# Patient Record
Sex: Male | Born: 1965 | Race: White | Hispanic: No | Smoking: Current every day smoker
Health system: Southern US, Community
[De-identification: ages and names within clinical notes are randomized; demographics above are authoritative.]

## PROBLEM LIST (undated history)

## (undated) DIAGNOSIS — I499 Cardiac arrhythmia, unspecified: Secondary | ICD-10-CM

## (undated) DIAGNOSIS — E119 Type 2 diabetes mellitus without complications: Secondary | ICD-10-CM

## (undated) DIAGNOSIS — F4310 Post-traumatic stress disorder, unspecified: Secondary | ICD-10-CM

## (undated) DIAGNOSIS — I10 Essential (primary) hypertension: Secondary | ICD-10-CM

## (undated) DIAGNOSIS — K760 Fatty (change of) liver, not elsewhere classified: Secondary | ICD-10-CM

## (undated) DIAGNOSIS — J439 Emphysema, unspecified: Secondary | ICD-10-CM

## (undated) DIAGNOSIS — Z8489 Family history of other specified conditions: Secondary | ICD-10-CM

## (undated) DIAGNOSIS — F419 Anxiety disorder, unspecified: Secondary | ICD-10-CM

## (undated) DIAGNOSIS — J40 Bronchitis, not specified as acute or chronic: Secondary | ICD-10-CM

## (undated) DIAGNOSIS — K219 Gastro-esophageal reflux disease without esophagitis: Secondary | ICD-10-CM

## (undated) DIAGNOSIS — G4730 Sleep apnea, unspecified: Secondary | ICD-10-CM

## (undated) DIAGNOSIS — J45909 Unspecified asthma, uncomplicated: Secondary | ICD-10-CM

## (undated) DIAGNOSIS — E785 Hyperlipidemia, unspecified: Secondary | ICD-10-CM

## (undated) DIAGNOSIS — R519 Headache, unspecified: Secondary | ICD-10-CM

## (undated) DIAGNOSIS — K439 Ventral hernia without obstruction or gangrene: Secondary | ICD-10-CM

## (undated) DIAGNOSIS — R000 Tachycardia, unspecified: Secondary | ICD-10-CM

## (undated) DIAGNOSIS — R0600 Dyspnea, unspecified: Secondary | ICD-10-CM

## (undated) DIAGNOSIS — I2510 Atherosclerotic heart disease of native coronary artery without angina pectoris: Secondary | ICD-10-CM

## (undated) DIAGNOSIS — M1990 Unspecified osteoarthritis, unspecified site: Secondary | ICD-10-CM

## (undated) DIAGNOSIS — F32A Depression, unspecified: Secondary | ICD-10-CM

## (undated) DIAGNOSIS — C801 Malignant (primary) neoplasm, unspecified: Secondary | ICD-10-CM

## (undated) DIAGNOSIS — F329 Major depressive disorder, single episode, unspecified: Secondary | ICD-10-CM

## (undated) DIAGNOSIS — M6208 Separation of muscle (nontraumatic), other site: Secondary | ICD-10-CM

## (undated) DIAGNOSIS — C3490 Malignant neoplasm of unspecified part of unspecified bronchus or lung: Secondary | ICD-10-CM

## (undated) DIAGNOSIS — I219 Acute myocardial infarction, unspecified: Secondary | ICD-10-CM

## (undated) DIAGNOSIS — R51 Headache: Secondary | ICD-10-CM

## (undated) HISTORY — PX: MOUTH SURGERY: SHX715

## (undated) HISTORY — DX: Anxiety disorder, unspecified: F41.9

## (undated) HISTORY — DX: Hyperlipidemia, unspecified: E78.5

## (undated) HISTORY — DX: Headache: R51

## (undated) HISTORY — DX: Type 2 diabetes mellitus without complications: E11.9

## (undated) HISTORY — DX: Acute myocardial infarction, unspecified: I21.9

## (undated) HISTORY — DX: Major depressive disorder, single episode, unspecified: F32.9

## (undated) HISTORY — PX: SHOULDER SURGERY: SHX246

## (undated) HISTORY — PX: OTHER SURGICAL HISTORY: SHX169

## (undated) HISTORY — DX: Headache, unspecified: R51.9

## (undated) HISTORY — DX: Unspecified asthma, uncomplicated: J45.909

## (undated) HISTORY — DX: Separation of muscle (nontraumatic), other site: M62.08

## (undated) HISTORY — DX: Malignant neoplasm of unspecified part of unspecified bronchus or lung: C34.90

## (undated) HISTORY — PX: CLAVICLE SURGERY: SHX598

## (undated) HISTORY — DX: Post-traumatic stress disorder, unspecified: F43.10

## (undated) HISTORY — DX: Depression, unspecified: F32.A

## (undated) HISTORY — DX: Gastro-esophageal reflux disease without esophagitis: K21.9

---

## 2004-12-08 ENCOUNTER — Other Ambulatory Visit: Payer: Self-pay

## 2004-12-08 ENCOUNTER — Emergency Department: Payer: Self-pay | Admitting: Internal Medicine

## 2004-12-13 ENCOUNTER — Ambulatory Visit: Payer: Self-pay | Admitting: Internal Medicine

## 2007-03-27 ENCOUNTER — Inpatient Hospital Stay: Payer: Self-pay | Admitting: Internal Medicine

## 2007-03-27 ENCOUNTER — Other Ambulatory Visit: Payer: Self-pay

## 2008-04-24 DIAGNOSIS — I219 Acute myocardial infarction, unspecified: Secondary | ICD-10-CM

## 2008-04-24 HISTORY — DX: Acute myocardial infarction, unspecified: I21.9

## 2008-12-16 ENCOUNTER — Emergency Department: Payer: Self-pay | Admitting: Emergency Medicine

## 2008-12-17 ENCOUNTER — Ambulatory Visit: Payer: Self-pay | Admitting: Family Medicine

## 2009-01-02 ENCOUNTER — Inpatient Hospital Stay: Payer: Self-pay | Admitting: Psychiatry

## 2009-05-25 ENCOUNTER — Emergency Department: Payer: Self-pay | Admitting: Emergency Medicine

## 2009-08-22 ENCOUNTER — Emergency Department: Payer: Self-pay | Admitting: Emergency Medicine

## 2009-10-23 ENCOUNTER — Emergency Department: Payer: Self-pay | Admitting: Emergency Medicine

## 2009-11-24 ENCOUNTER — Ambulatory Visit: Payer: Self-pay | Admitting: Internal Medicine

## 2010-10-25 ENCOUNTER — Emergency Department: Payer: Self-pay | Admitting: Unknown Physician Specialty

## 2011-10-14 LAB — DRUG SCREEN, URINE
Amphetamines, Ur Screen: NEGATIVE (ref ?–1000)
Benzodiazepine, Ur Scrn: NEGATIVE (ref ?–200)
Cocaine Metabolite,Ur ~~LOC~~: NEGATIVE (ref ?–300)
MDMA (Ecstasy)Ur Screen: NEGATIVE (ref ?–500)
Opiate, Ur Screen: NEGATIVE (ref ?–300)
Phencyclidine (PCP) Ur S: NEGATIVE (ref ?–25)
Tricyclic, Ur Screen: POSITIVE (ref ?–1000)

## 2011-10-14 LAB — CBC
HCT: 42.5 % (ref 40.0–52.0)
HGB: 15 g/dL (ref 13.0–18.0)
MCH: 31.6 pg (ref 26.0–34.0)
MCV: 90 fL (ref 80–100)
Platelet: 206 10*3/uL (ref 150–440)
RBC: 4.74 10*6/uL (ref 4.40–5.90)
RDW: 13.3 % (ref 11.5–14.5)
WBC: 10.4 10*3/uL (ref 3.8–10.6)

## 2011-10-14 LAB — COMPREHENSIVE METABOLIC PANEL
Albumin: 3.9 g/dL (ref 3.4–5.0)
Anion Gap: 9 (ref 7–16)
BUN: 12 mg/dL (ref 7–18)
Bilirubin,Total: 0.3 mg/dL (ref 0.2–1.0)
EGFR (Non-African Amer.): >60
Glucose: 102 mg/dL — ABNORMAL HIGH (ref 65–99)
SGPT (ALT): 34 U/L
Sodium: 140 mmol/L (ref 136–145)
Total Protein: 8 g/dL (ref 6.4–8.2)

## 2011-10-14 LAB — ETHANOL
Ethanol %: <0.003 % (ref 0.000–0.080)
Ethanol: <3 mg/dL

## 2011-10-14 LAB — SALICYLATE LEVEL: Salicylates, Serum: 2.5 mg/dL

## 2011-10-16 ENCOUNTER — Inpatient Hospital Stay: Payer: Self-pay | Admitting: Psychiatry

## 2011-11-18 ENCOUNTER — Emergency Department: Payer: Self-pay | Admitting: *Deleted

## 2011-11-18 LAB — TROPONIN I: Troponin-I: <0.02 ng/mL

## 2011-11-18 LAB — COMPREHENSIVE METABOLIC PANEL
BUN: 9 mg/dL (ref 7–18)
Bilirubin,Total: 0.3 mg/dL (ref 0.2–1.0)
Chloride: 96 mmol/L — ABNORMAL LOW (ref 98–107)
Co2: 27 mmol/L (ref 21–32)
Creatinine: 0.83 mg/dL (ref 0.60–1.30)
EGFR (African American): >60
EGFR (Non-African Amer.): >60
Osmolality: 261 (ref 275–301)
SGPT (ALT): 22 U/L
Sodium: 131 mmol/L — ABNORMAL LOW (ref 136–145)

## 2011-11-18 LAB — CBC
HCT: 43.1 % (ref 40.0–52.0)
MCH: 32.3 pg (ref 26.0–34.0)
MCHC: 35.6 g/dL (ref 32.0–36.0)
MCV: 91 fL (ref 80–100)
RDW: 13.8 % (ref 11.5–14.5)

## 2011-11-18 LAB — CK TOTAL AND CKMB (NOT AT ARMC): CK, Total: 116 U/L (ref 35–232)

## 2012-01-02 ENCOUNTER — Inpatient Hospital Stay: Payer: Self-pay | Admitting: Psychiatry

## 2012-01-02 LAB — DRUG SCREEN, URINE
Amphetamines, Ur Screen: NEGATIVE (ref ?–1000)
Barbiturates, Ur Screen: NEGATIVE (ref ?–200)
Benzodiazepine, Ur Scrn: NEGATIVE (ref ?–200)
Cannabinoid 50 Ng, Ur ~~LOC~~: NEGATIVE (ref ?–50)
Cocaine Metabolite,Ur ~~LOC~~: NEGATIVE (ref ?–300)
Methadone, Ur Screen: NEGATIVE (ref ?–300)
Opiate, Ur Screen: NEGATIVE (ref ?–300)
Tricyclic, Ur Screen: NEGATIVE (ref ?–1000)

## 2012-01-02 LAB — URINALYSIS, COMPLETE
Blood: NEGATIVE
Glucose,UR: NEGATIVE mg/dL (ref 0–75)
Leukocyte Esterase: NEGATIVE
Nitrite: NEGATIVE
Protein: NEGATIVE
RBC,UR: 1 /HPF (ref 0–5)
Specific Gravity: 1.015 (ref 1.003–1.030)
WBC UR: 2 /HPF (ref 0–5)

## 2012-01-02 LAB — COMPREHENSIVE METABOLIC PANEL
Albumin: 3.8 g/dL (ref 3.4–5.0)
Anion Gap: 11 (ref 7–16)
BUN: 14 mg/dL (ref 7–18)
Bilirubin,Total: 0.2 mg/dL (ref 0.2–1.0)
Chloride: 106 mmol/L (ref 98–107)
Co2: 24 mmol/L (ref 21–32)
Creatinine: 0.98 mg/dL (ref 0.60–1.30)
EGFR (African American): >60
EGFR (Non-African Amer.): >60
Potassium: 3.9 mmol/L (ref 3.5–5.1)
SGOT(AST): 15 U/L (ref 15–37)
SGPT (ALT): 22 U/L (ref 12–78)
Total Protein: 7.9 g/dL (ref 6.4–8.2)

## 2012-01-02 LAB — CBC
HCT: 42.6 % (ref 40.0–52.0)
MCV: 93 fL (ref 80–100)
Platelet: 238 10*3/uL (ref 150–440)
RBC: 4.57 10*6/uL (ref 4.40–5.90)
RDW: 14.5 % (ref 11.5–14.5)
WBC: 9 10*3/uL (ref 3.8–10.6)

## 2012-01-02 LAB — ETHANOL: Ethanol %: <0.003 % (ref 0.000–0.080)

## 2012-01-02 LAB — TSH: Thyroid Stimulating Horm: 1.04 u[IU]/mL

## 2012-01-03 LAB — CARBAMAZEPINE LEVEL, TOTAL: Carbamazepine: 6.4 ug/mL (ref 4.0–12.0)

## 2012-01-04 LAB — FOLATE: Folic Acid: 7.9 ng/mL (ref 3.1–100.0)

## 2012-01-29 ENCOUNTER — Emergency Department: Payer: Self-pay | Admitting: Emergency Medicine

## 2012-01-29 LAB — BASIC METABOLIC PANEL
Anion Gap: 9 (ref 7–16)
BUN: 8 mg/dL (ref 7–18)
Chloride: 107 mmol/L (ref 98–107)
Creatinine: 1.07 mg/dL (ref 0.60–1.30)
EGFR (African American): >60
EGFR (Non-African Amer.): >60
Glucose: 91 mg/dL (ref 65–99)
Osmolality: 281 (ref 275–301)
Potassium: 3.7 mmol/L (ref 3.5–5.1)
Sodium: 142 mmol/L (ref 136–145)

## 2012-01-29 LAB — CBC
MCHC: 35.2 g/dL (ref 32.0–36.0)
Platelet: 208 10*3/uL (ref 150–440)
RBC: 4.74 10*6/uL (ref 4.40–5.90)
WBC: 10.9 10*3/uL — ABNORMAL HIGH (ref 3.8–10.6)

## 2012-01-29 LAB — CK TOTAL AND CKMB (NOT AT ARMC)
CK, Total: 84 U/L (ref 35–232)
CK-MB: <0.5 ng/mL — ABNORMAL LOW (ref 0.5–3.6)

## 2012-01-29 LAB — TROPONIN I: Troponin-I: <0.02 ng/mL

## 2012-01-30 LAB — DRUG SCREEN, URINE
Amphetamines, Ur Screen: NEGATIVE (ref ?–1000)
Barbiturates, Ur Screen: NEGATIVE (ref ?–200)
Benzodiazepine, Ur Scrn: NEGATIVE (ref ?–200)
Methadone, Ur Screen: NEGATIVE (ref ?–300)
Opiate, Ur Screen: NEGATIVE (ref ?–300)
Tricyclic, Ur Screen: NEGATIVE (ref ?–1000)

## 2012-02-06 ENCOUNTER — Emergency Department: Payer: Self-pay | Admitting: Emergency Medicine

## 2012-02-06 LAB — TROPONIN I: Troponin-I: <0.02 ng/mL

## 2012-02-06 LAB — CK TOTAL AND CKMB (NOT AT ARMC): CK-MB: <0.5 ng/mL — ABNORMAL LOW (ref 0.5–3.6)

## 2012-02-06 LAB — COMPREHENSIVE METABOLIC PANEL
Albumin: 4.2 g/dL (ref 3.4–5.0)
Alkaline Phosphatase: 142 U/L — ABNORMAL HIGH (ref 50–136)
Anion Gap: 9 (ref 7–16)
BUN: 8 mg/dL (ref 7–18)
Calcium, Total: 9.6 mg/dL (ref 8.5–10.1)
Creatinine: 0.84 mg/dL (ref 0.60–1.30)
EGFR (Non-African Amer.): >60
Glucose: 93 mg/dL (ref 65–99)
Osmolality: 276 (ref 275–301)
SGOT(AST): 13 U/L — ABNORMAL LOW (ref 15–37)
SGPT (ALT): 27 U/L (ref 12–78)
Sodium: 139 mmol/L (ref 136–145)

## 2012-02-06 LAB — CBC
HCT: 47.1 % (ref 40.0–52.0)
HGB: 16.4 g/dL (ref 13.0–18.0)
MCHC: 34.9 g/dL (ref 32.0–36.0)
RBC: 5.01 10*6/uL (ref 4.40–5.90)
WBC: 9.1 10*3/uL (ref 3.8–10.6)

## 2012-02-06 LAB — DRUG SCREEN, URINE
Amphetamines, Ur Screen: NEGATIVE (ref ?–1000)
Cocaine Metabolite,Ur ~~LOC~~: NEGATIVE (ref ?–300)
MDMA (Ecstasy)Ur Screen: NEGATIVE (ref ?–500)
Methadone, Ur Screen: NEGATIVE (ref ?–300)
Opiate, Ur Screen: NEGATIVE (ref ?–300)
Phencyclidine (PCP) Ur S: NEGATIVE (ref ?–25)

## 2012-12-08 ENCOUNTER — Observation Stay: Payer: Self-pay | Admitting: Internal Medicine

## 2012-12-08 LAB — CBC
HCT: 42 % (ref 40.0–52.0)
MCHC: 35 g/dL (ref 32.0–36.0)
Platelet: 207 10*3/uL (ref 150–440)
RBC: 4.87 10*6/uL (ref 4.40–5.90)

## 2012-12-08 LAB — HEPATIC FUNCTION PANEL A (ARMC)
Albumin: 4.1 g/dL (ref 3.4–5.0)
Alkaline Phosphatase: 118 U/L (ref 50–136)
Bilirubin, Direct: <0.1 mg/dL (ref 0.00–0.20)
Bilirubin,Total: 0.3 mg/dL (ref 0.2–1.0)
SGOT(AST): 28 U/L (ref 15–37)
SGPT (ALT): 42 U/L (ref 12–78)
Total Protein: 8 g/dL (ref 6.4–8.2)

## 2012-12-08 LAB — BASIC METABOLIC PANEL
Anion Gap: 6 — ABNORMAL LOW (ref 7–16)
BUN: 17 mg/dL (ref 7–18)
Creatinine: 1.25 mg/dL (ref 0.60–1.30)
EGFR (African American): >60
Osmolality: 278 (ref 275–301)

## 2012-12-08 LAB — CK TOTAL AND CKMB (NOT AT ARMC)
CK, Total: 72 U/L (ref 35–232)
CK, Total: 74 U/L (ref 35–232)
CK-MB: <0.5 ng/mL — ABNORMAL LOW (ref 0.5–3.6)
CK-MB: <0.5 ng/mL — ABNORMAL LOW (ref 0.5–3.6)

## 2012-12-08 LAB — LIPASE, BLOOD: Lipase: 98 U/L (ref 73–393)

## 2012-12-08 LAB — TROPONIN I: Troponin-I: <0.02 ng/mL

## 2012-12-09 DIAGNOSIS — R079 Chest pain, unspecified: Secondary | ICD-10-CM

## 2012-12-09 LAB — CK TOTAL AND CKMB (NOT AT ARMC): CK-MB: <0.5 ng/mL — ABNORMAL LOW (ref 0.5–3.6)

## 2012-12-09 LAB — LIPID PANEL
Cholesterol: 166 mg/dL (ref 0–200)
Ldl Cholesterol, Calc: 96 mg/dL (ref 0–100)
Triglycerides: 130 mg/dL (ref 0–200)
VLDL Cholesterol, Calc: 26 mg/dL (ref 5–40)

## 2012-12-09 LAB — TROPONIN I: Troponin-I: <0.02 ng/mL

## 2012-12-09 LAB — TSH: Thyroid Stimulating Horm: 0.941 u[IU]/mL

## 2012-12-09 LAB — HEMOGLOBIN A1C: Hemoglobin A1C: 6.2 % (ref 4.2–6.3)

## 2013-07-31 ENCOUNTER — Emergency Department: Payer: Self-pay | Admitting: Emergency Medicine

## 2013-07-31 LAB — URINALYSIS, COMPLETE
BLOOD: NEGATIVE
Bacteria: NONE SEEN
Bilirubin,UR: NEGATIVE
GLUCOSE, UR: NEGATIVE mg/dL (ref 0–75)
Ketone: NEGATIVE
Leukocyte Esterase: NEGATIVE
Nitrite: NEGATIVE
PROTEIN: NEGATIVE
Ph: 7 (ref 4.5–8.0)
RBC,UR: NONE SEEN /HPF (ref 0–5)
Specific Gravity: 1.003 (ref 1.003–1.030)
Squamous Epithelial: NONE SEEN
WBC UR: <1 /HPF (ref 0–5)

## 2013-07-31 LAB — BASIC METABOLIC PANEL
Anion Gap: 4 — ABNORMAL LOW (ref 7–16)
BUN: 9 mg/dL (ref 7–18)
CHLORIDE: 109 mmol/L — AB (ref 98–107)
CO2: 29 mmol/L (ref 21–32)
Calcium, Total: 9.1 mg/dL (ref 8.5–10.1)
Creatinine: 1.05 mg/dL (ref 0.60–1.30)
EGFR (African American): >60
Glucose: 136 mg/dL — ABNORMAL HIGH (ref 65–99)
OSMOLALITY: 284 (ref 275–301)
Potassium: 4 mmol/L (ref 3.5–5.1)
Sodium: 142 mmol/L (ref 136–145)

## 2013-07-31 LAB — CBC
HCT: 42.5 % (ref 40.0–52.0)
HGB: 14.4 g/dL (ref 13.0–18.0)
MCH: 31.1 pg (ref 26.0–34.0)
MCHC: 33.8 g/dL (ref 32.0–36.0)
MCV: 92 fL (ref 80–100)
PLATELETS: 231 10*3/uL (ref 150–440)
RBC: 4.63 10*6/uL (ref 4.40–5.90)
RDW: 14.2 % (ref 11.5–14.5)
WBC: 8.1 10*3/uL (ref 3.8–10.6)

## 2013-07-31 LAB — CK TOTAL AND CKMB (NOT AT ARMC)
CK, TOTAL: 144 U/L
CK-MB: 0.6 ng/mL (ref 0.5–3.6)

## 2013-07-31 LAB — DRUG SCREEN, URINE

## 2013-07-31 LAB — PRO B NATRIURETIC PEPTIDE: B-Type Natriuretic Peptide: 97 pg/mL (ref 0–125)

## 2013-07-31 LAB — TROPONIN I: Troponin-I: <0.02 ng/mL

## 2013-10-13 ENCOUNTER — Emergency Department: Payer: Self-pay | Admitting: Emergency Medicine

## 2013-11-28 ENCOUNTER — Emergency Department: Payer: Self-pay | Admitting: Emergency Medicine

## 2013-11-28 LAB — URINALYSIS, COMPLETE
Bacteria: NONE SEEN
Bilirubin,UR: NEGATIVE
Blood: NEGATIVE
Glucose,UR: 50 mg/dL (ref 0–75)
KETONE: NEGATIVE
LEUKOCYTE ESTERASE: NEGATIVE
Nitrite: NEGATIVE
PH: 6 (ref 4.5–8.0)
Protein: NEGATIVE
RBC,UR: NONE SEEN /HPF (ref 0–5)
SPECIFIC GRAVITY: 1.017 (ref 1.003–1.030)
Squamous Epithelial: NONE SEEN
WBC UR: NONE SEEN /HPF (ref 0–5)

## 2013-11-28 LAB — CBC
HCT: 42.7 % (ref 40.0–52.0)
HGB: 14.8 g/dL (ref 13.0–18.0)
MCH: 31.3 pg (ref 26.0–34.0)
MCHC: 34.6 g/dL (ref 32.0–36.0)
MCV: 91 fL (ref 80–100)
Platelet: 209 10*3/uL (ref 150–440)
RBC: 4.72 10*6/uL (ref 4.40–5.90)
RDW: 13.6 % (ref 11.5–14.5)
WBC: 10.3 10*3/uL (ref 3.8–10.6)

## 2013-11-28 LAB — TROPONIN I
Troponin-I: <0.02 ng/mL
Troponin-I: <0.02 ng/mL

## 2013-11-28 LAB — BASIC METABOLIC PANEL
ANION GAP: 7 (ref 7–16)
BUN: 12 mg/dL (ref 7–18)
CO2: 26 mmol/L (ref 21–32)
Calcium, Total: 8.5 mg/dL (ref 8.5–10.1)
Chloride: 105 mmol/L (ref 98–107)
Creatinine: 1.25 mg/dL (ref 0.60–1.30)
EGFR (African American): >60
Glucose: 163 mg/dL — ABNORMAL HIGH (ref 65–99)
OSMOLALITY: 279 (ref 275–301)
Potassium: 3.7 mmol/L (ref 3.5–5.1)
SODIUM: 138 mmol/L (ref 136–145)

## 2014-03-03 DIAGNOSIS — S42001K Fracture of unspecified part of right clavicle, subsequent encounter for fracture with nonunion: Secondary | ICD-10-CM | POA: Insufficient documentation
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
## 2014-08-14 NOTE — Discharge Summary (Signed)
PATIENT NAME:  Raymond, [name] MR#:  [id number] DATE OF BIRTH:  Jan 10, 1966  ADMITTING PHYSICIAN: Dr. Dustin Flock.   DISCHARGING PHYSICIAN: Dr. Gladstone Lighter.  DISCHARGE DIAGNOSES: 1.  Chest pain, likely gastritis.  2.  Hypertension.  3.  Hyperlipidemia.  4.  History of supraventricular tachycardia.  5.  Posttraumatic stress disorder.  6.  Borderline personality disorder.  7.  Bipolar with anxiety and depression.   DISCHARGE HOME MEDICATIONS:  1.  Metoprolol 100 mg p.o. daily.  2.  Paroxetine 40 mg p.o. at bedtime.  3.  Sublingual nitroglycerin 0.4 mg every five minutes p.r.n. for chest pain.  4.  Pravastatin 40 mg p.o. daily.  5.  Aspirin 81 mg p.o. daily.  6.  Omeprazole 20 mg p.o. daily.   DISCHARGE DIET: Low-sodium diet.   DISCHARGE ACTIVITY: As tolerated.   FOLLOWUP INSTRUCTIONS: PCP follow-up in 2 to 3 weeks.   LABS AND IMAGING STUDIES PRIOR TO DISCHARGE: LDL cholesterol 96, HDL 44, total cholesterol 166, triglycerides 130. HbA1c 6.2. TSH 0.941. WBC 11.5, hemoglobin 14.7, hematocrit 42.0, platelet count 207.   Sodium 136, potassium 4.4, chloride 103, bicarbonate 27, BUN 17, creatinine 1.25, glucose 177,  calcium 9.3, troponin less than 0.02.   ALT 42, AST 28, alk phos 118, total bili 0.3, lipase of 98.   Chest x-ray revealing anterior portion of the first rib is more prominent on the right side than the left side bilaterally which is stable and comparable to previous studies done.   Myocardial scan showing no significant ischemia. No wall-motion abnormalities. Estimated EF is 59%. Normal global LV function. Overall a low-risk scan.   BRIEF HOSPITAL COURSE: [name] is a 49 year old male with history of PTSD, borderline personality disorder, bipolar with depression and anxiety, history of hypertension, and coronary artery disease, who was brought in from prison secondary to a complaint of chest pain.   1.  Chest pain sounds more like gastritis kind of pain, as  it was more epigastric region radiating up in the midsternal. The pain has improved significantly since the hospital admission, but because there was a question of coronary artery disease in the past according to the patient, he had a Myoview done which was completely normal, so he was discharged home. He was placed on omeprazole for his possible gastritis.  2.  Hypertension. The patient on metoprolol.  3.  Hyperlipidemia. The patient is on pravastatin.   The patient was brought with police officer and was discharged back to prison. His course has been otherwise uneventful in the hospital.   DISCHARGE CONDITION: Stable.   DISCHARGE DISPOSITION: Being discharged back to prison.   Time spent on discharge is 45 minutes.    ____________________________ Gladstone Lighter, MD rk:np D: 12/11/2012 15:17:55 ET T: 12/11/2012 20:49:40 ET JOB#: [id number]  cc: Gladstone Lighter, MD, <Dictator> Gladstone Lighter MD ELECTRONICALLY SIGNED 12/16/2012 22:16

## 2014-08-14 NOTE — H&P (Signed)
PATIENT NAME:  Raymond [name], Raymond [name] MR#:  [id number] DATE OF BIRTH:  December 11, 1965  DATE OF ADMISSION:  12/08/2012  PRIMARY CARE PROVIDER: None.   ED REFERRING DOCTOR:  Dr. Hinda Kehr.  CHIEF COMPLAINT: Substernal chest pain, as well as epigastric pain.   HISTORY OF PRESENT ILLNESS: The patient is a 49 year old Caucasian male who is currently in prison, has a history of hypertension according to him. He had a heart attack about  5 to 6 years ago, who was told that he needed a cardiac cath but he never got the cardiac catheterization, who states that around noontime today he started having sharp, substernal pain, and also became diaphoretic and had some nausea. The patient reports that he does have a history of GERD, and does not have a similar type of pain. He does have some acidic taste in mouth when his GERD bothers him. He did not have any shortness of breath. He did not have any radiation of the pain. The patient reports that prior to this episode last time he had any type of chest discomfort was about 5 to 6 years ago. He otherwise denies any orthopnea. No dyspnea on exertion. Denies any fevers, chills. No cough. No emesis or diarrhea.   PAST MEDICAL HISTORY: Significant for:  1.  Hypertension.  2.  History of coronary artery, disease as per his report.  3.  PTSD.  4.  Anxiety.  5.  Borderline personality disorder.  6.  Bipolar disorder.  7.  History of supraventricular tachycardia.  8.  Depression.  9.  History of right clavicle fracture. Did not have it repaired.   No surgeries.   ALLERGIES: To HYDROCODONE and ONIONS.   CURRENT MEDICATIONS: Metoprolol tartrate 200 q. daily, paroxetine 40 q. daily, pravastatin 40 at bedtime.   SOCIAL HISTORY: Smokes 1-1/2 pack for 30 years. No smoking for the past one year since in jail. No alcohol use currently. He used to drink heavily. No drug use.   FAMILY HISTORY: Father with coronary artery disease. Not sure at what age.   REVIEW OF SYSTEMS:   CONSTITUTIONAL: Denies any fevers, fatigue. No weakness. No pain. No weight loss. No weight gain. He is having pain in his chest.  EYES: No blurred or double vision. No pain. No redness. No inflammation. No glaucoma. No cataracts.  ENT: No tinnitus. No ear pain. No hearing loss. No allergies, seasonal or year-round allergies. No epistaxis. No difficulty swallowing.  RESPIRATORY: Denies any cough, wheezing, hemoptysis. No dyspnea. No asthma.  CARDIOVASCULAR: Complains of chest pain. No orthopnea. No edema. No arrhythmia. No palpitations. No syncope.  GASTROINTESTINAL: No nausea, vomiting, diarrhea. No abdominal pain. No hematemesis. No melena. No ulcer. Has chronic GERD.  GENITOURINARY: Denies any dysuria, hematuria, renal calculus or frequency.  ENDOCRINE: Denies any polyuria, nocturia or thyroid problems.  HEMATOLOGIC/LYMPHATIC: Denies any major bruisability or bleeding.  SKIN: No acne. No rash. No changes in mole, hair or skin.  MUSCULOSKELETAL: Denies any pain in the neck, back or shoulder.  NEUROLOGIC: No numbness. No CVA. No transient ischemic attack. No seizures.  PSYCHIATRIC: Has history of anxiety. He has bipolar disorder, depression.   PHYSICAL EXAMINATION: VITAL SIGNS: Temperature 97.7, pulse 62, respirations 18, blood pressure 151/81, O2 97%.  GENERAL: The patient is a mildly obese male, in no acute distress.  HEENT: Head atraumatic, normocephalic. Pupils equally round, reactive to light and accommodation. There is no conjunctival pallor. No scleral icterus. Nasal exam shows no dryness  or ulceration.  OROPHARYNX: Clear,  without any exudates.  NECK: Supple. No JVD. No carotid bruits.  CARDIOVASCULAR: Regular rate and rhythm. No murmurs, rubs, clicks. PMI is not displaced.  LUNGS: Clear to auscultation bilaterally, without any rales, rhonchi, wheezing.   ABDOMEN: Soft, nontender, nondistended. Positive bowel sounds x 4.  EXTREMITIES: No clubbing, cyanosis or edema.  SKIN: No  rash.  LYMPHATICS: No lymph nodes palpable.  VASCULAR: Good DP, PT pulses.  PSYCHIATRIC: Not anxious or depressed.  NEUROLOGIC: Awake, alert, oriented x 3. No focal deficits.   EVALUATIONS: EKG shows normal sinus rhythm with Q waves in leads V1 and V2. No ST-T wave changes.   Glucose 177, BUN 17, creatinine 1.25, sodium 136, potassium 4.4, chloride 103, CO2 22, calcium 9.3. Troponin less than 0.02 x 2.   WBC 11.5, hemoglobin 14.7, platelet count 207.   Chest x-ray showed probable prominent anterior first rib.   ASSESSMENT AND PLAN: The patient is 49 year old with a history of hypertension, hyperlipidemia, with his blood sugar being elevated, possible diabetes, who is having sharp chest pain as well as some epigastric pain.   1.  Chest pain/upper epigastric pain, atypical in nature: At this time will place the patient under observation, check serial enzymes, perform a stress sestamibi in the morning. I will try some Gas-Ex and proton pump inhibitors b.i.d. In case this is GI-related I will also check a lipase and LFTs.  2.  Hypertension: Continue metoprolol, as taking at home.  3.  Depression, anxiety: Continue paroxetine as taking at home. 4.  Hyperglycemia: Noted on BMP: We will check a hemoglobin Ac1.  5.  Hyperlipidemia. Continue pravastatin as taking at home. Check a fasting lipid panel in the a.m.   TIME SPENT: Note 45 minutes spent on this patient. Thank you    ____________________________ Lafonda Mosses. Posey Pronto, MD shp:dm D: 12/08/2012 14:26:01 ET T: 12/08/2012 15:38:11 ET JOB#: [id number]  cc: Rasaan Brotherton H. Posey Pronto, MD, <Dictator> Alric Seton MD ELECTRONICALLY SIGNED 12/09/2012 16:30

## 2014-08-16 NOTE — Consult Note (Signed)
Psychological Assessment  Gearld Creamer47of Evaluation: 6-26-13Administered: [place] (MMPI-2) for Referral: Mr. Luce was referred for a psychological assessment by his physician, Orson Slick, MD.  He was admitted to [facility] for the treatment of mood disorder. Please see the history and physical and psychosocial history for further background information. An assessment for diagnostic clarification was requested. Mr. Kelter MMPI-2 protocol is compared to that of other adult males he obtained the following profile: [id number]/:# The MMPI-2 validity scales indicate that the clinical profile is not valid. It appears to be extremely exaggerated possibly in an attempt to call attention to the extreme distress he experiences. Impression:Profile   Electronic Signatures: Garald Braver (PsyD, HSP-P)  (Signed on 26-Jun-13 15:36)  Authored  Last Updated: 26-Jun-13 15:36 by Garald Braver (PsyD, HSP-P)
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
## 2014-11-02 ENCOUNTER — Emergency Department
Admission: EM | Admit: 2014-11-02 | Discharge: 2014-11-02 | Disposition: A | Discharge status: Home / Self Care | Payer: Self-pay | Attending: Student | Admitting: Student

## 2014-11-02 ENCOUNTER — Encounter: Payer: Self-pay | Admitting: Emergency Medicine

## 2014-11-02 ENCOUNTER — Other Ambulatory Visit: Payer: Self-pay

## 2014-11-02 ENCOUNTER — Emergency Department: Payer: Self-pay

## 2014-11-02 DIAGNOSIS — M25511 Pain in right shoulder: Secondary | ICD-10-CM | POA: Insufficient documentation

## 2014-11-02 DIAGNOSIS — Z9889 Other specified postprocedural states: Secondary | ICD-10-CM | POA: Insufficient documentation

## 2014-11-02 DIAGNOSIS — Z720 Tobacco use: Secondary | ICD-10-CM | POA: Insufficient documentation

## 2014-11-02 DIAGNOSIS — I10 Essential (primary) hypertension: Secondary | ICD-10-CM | POA: Insufficient documentation

## 2014-11-02 DIAGNOSIS — R000 Tachycardia, unspecified: Secondary | ICD-10-CM | POA: Insufficient documentation

## 2014-11-02 HISTORY — DX: Cardiac arrhythmia, unspecified: I49.9

## 2014-11-02 HISTORY — DX: Essential (primary) hypertension: I10

## 2014-11-02 LAB — CBC
HEMATOCRIT: 42.4 % (ref 40.0–52.0)
Hemoglobin: 14.7 g/dL (ref 13.0–18.0)
MCH: 32.4 pg (ref 26.0–34.0)
MCHC: 34.6 g/dL (ref 32.0–36.0)
MCV: 93.7 fL (ref 80.0–100.0)
Platelets: 199 10*3/uL (ref 150–440)
RBC: 4.53 MIL/uL (ref 4.40–5.90)
RDW: 14.3 % (ref 11.5–14.5)
WBC: 9.8 10*3/uL (ref 3.8–10.6)

## 2014-11-02 LAB — COMPREHENSIVE METABOLIC PANEL
ALBUMIN: 4 g/dL (ref 3.5–5.0)
ALT: 35 U/L (ref 17–63)
AST: 33 U/L (ref 15–41)
Alkaline Phosphatase: 93 U/L (ref 38–126)
Anion gap: 9 (ref 5–15)
BILIRUBIN TOTAL: 0.3 mg/dL (ref 0.3–1.2)
BUN: 14 mg/dL (ref 6–20)
CALCIUM: 8.9 mg/dL (ref 8.9–10.3)
CO2: 22 mmol/L (ref 22–32)
CREATININE: 1.2 mg/dL (ref 0.61–1.24)
Chloride: 105 mmol/L (ref 101–111)
GFR calc non Af Amer: >60 mL/min (ref >60)
GLUCOSE: 239 mg/dL — AB (ref 65–99)
Potassium: 4 mmol/L (ref 3.5–5.1)
Sodium: 136 mmol/L (ref 135–145)
TOTAL PROTEIN: 7.5 g/dL (ref 6.5–8.1)

## 2014-11-02 LAB — TROPONIN I: Troponin I: <0.03 ng/mL (ref <0.031)

## 2014-11-02 MED ORDER — DEXAMETHASONE SODIUM PHOSPHATE 10 MG/ML IJ SOLN
10.0000 mg | Freq: Once | INTRAMUSCULAR | Status: AC
  Administered 2014-11-02: 10 mg via INTRAVENOUS
  Filled 2014-11-02: qty 1

## 2014-11-02 MED ORDER — HYDROMORPHONE HCL 1 MG/ML IJ SOLN
1.0000 mg | Freq: Once | INTRAMUSCULAR | Status: AC
  Administered 2014-11-02: 1 mg via INTRAVENOUS
  Filled 2014-11-02: qty 1

## 2014-11-02 MED ORDER — SODIUM CHLORIDE 0.9 % IV BOLUS (SEPSIS)
500.0000 mL | Freq: Once | INTRAVENOUS | Status: AC
  Administered 2014-11-02: 500 mL via INTRAVENOUS

## 2014-11-02 MED ORDER — OXYCODONE HCL 5 MG PO TABS
5.0000 mg | ORAL_TABLET | Freq: Four times a day (QID) | ORAL | Status: DC | PRN
Start: 2014-11-02 — End: 2017-05-30

## 2014-11-02 NOTE — ED Provider Notes (Signed)
[facility] Emergency Department Provider Note     Time seen: ----------------------------------------- 2:44 PM on 11/02/2014 -----------------------------------------    I have reviewed the triage vital signs and the nursing notes.   HISTORY  Chief Complaint Shoulder Pain    HPI Raymond [name] is a 48 y.o. male who presents ER for severe right shoulder pain and some numbness. Patient states the numbness has worsened and the pain radiates from his right shoulder down his right arm. Patient states he had shoulder surgery at [facility] in December and has not had any pain medicine since January. Patient states working a lot physical man aggravated his shoulder. Denies any fevers chills chest pain shortness breath, nausea vomiting or diarrhea. Pain is currently severe and worse in the right shoulder with range of motion.   Past Medical History  Diagnosis Date  . Hypertension   . Irregular heart beat unk    There are no active problems to display for this patient.   Past Surgical History  Procedure Laterality Date  . Shoulder surgery Right     Allergies Norco  Social History History  Substance Use Topics  . Smoking status: Current Every Day Smoker  . Smokeless tobacco: Not on file  . Alcohol Use: No    Review of Systems Constitutional: Negative for fever. Eyes: Negative for visual changes. ENT: Negative for sore throat. Cardiovascular: Negative for chest pain. Respiratory: Negative for shortness of breath. Gastrointestinal: Negative for abdominal pain, vomiting and diarrhea. Genitourinary: Negative for dysuria. Musculoskeletal: Positive for severe right shoulder pain Skin: Negative for rash. Neurological: Negative for headaches, positive for right shoulder numbness  10-point ROS otherwise negative.  ____________________________________________   PHYSICAL EXAM:  VITAL SIGNS: ED Triage Vitals  Enc Vitals Group     BP 11/02/14 1431  165/92 mmHg     Pulse Rate 11/02/14 1431 144     Resp 11/02/14 1431 20     Temp 11/02/14 1431 98.2 F (36.8 C)     Temp Source 11/02/14 1431 Oral     SpO2 11/02/14 1431 96 %     Weight 11/02/14 1431 230 lb (104.327 kg)     Height 11/02/14 1431 5\' 9"  (1.753 m)     Head Cir --      Peak Flow --      Pain Score 11/02/14 1432 10     Pain Loc --      Pain Edu? --      Excl. in [place]? --     Constitutional: Alert and oriented. Mild distress Eyes: Conjunctivae are normal. PERRL. Normal extraocular movements. ENT   Head: Normocephalic and atraumatic.   Nose: No congestion/rhinnorhea.   Mouth/Throat: Mucous membranes are moist.   Neck: No stridor. Hematological/Lymphatic/Immunilogical: No cervical lymphadenopathy. Cardiovascular: Rapid rate, regular rhythm. Normal and symmetric distal pulses are present in all extremities. No murmurs, rubs, or gallops. Respiratory: Normal respiratory effort without tachypnea nor retractions. Breath sounds are clear and equal bilaterally. No wheezes/rales/rhonchi. Gastrointestinal: Soft and nontender. No distention. No abdominal bruits. There is no CVA tenderness. Musculoskeletal: Significant tenderness of the right shoulder. Severe pain range of motion the right shoulder. There is focal paresthesia around the right shoulder anteriorly. Neurologic:  Normal speech and language. No gross focal neurologic deficits are appreciated. Speech is normal. No gait instability. Skin:  Skin is warm, dry and intact. No rash noted. Psychiatric: Mood and affect are normal. Speech and behavior are normal. Patient exhibits appropriate insight and judgment. ____________________________________________  EKG: Interpreted  by me. Sinus tachycardia with rate 139 bpm, normal axis, normal intervals. No evidence of hypertrophy or acute infarction.  ____________________________________________  ED COURSE:  Pertinent labs & imaging results that were available during my  care of the patient were reviewed by me and considered in my medical decision making (see chart for details). Check cardiac labs, give IV pain medicine and Decadron. ____________________________________________    LABS (pertinent positives/negatives)  Labs Reviewed - No data to display  RADIOLOGY Chest x-ray will be performed  ____________________________________________  FINAL ASSESSMENT AND PLAN  Severe right shoulder pain, tachycardia  Plan: Patient with severe shoulder pain postoperatively, likely due to recent activity. I think the tachycardia is secondary to pain. Patient's final disposition and care will be checked out to Dr. Edd Fabian for final disposition   Jimmye Norman Algis Liming, MD   Earleen Newport, MD 11/02/14 [phone]

## 2014-11-02 NOTE — ED Notes (Signed)
Pain improved 6/10.  AAOx3.  Skin warm and dry.  No SOB/DOE.  Continue to monitor

## 2014-11-02 NOTE — ED Provider Notes (Signed)
-----------------------------------------   4:16 PM on 11/02/2014 -----------------------------------------  Care was assumed from Dr. Jimmye Norman at approximately 3 PM pending labs, chest x-ray, pain control. Labs reviewed by me and are unremarkable, troponin negative, doubt cardiac cause of pain. Chest x-ray also negative. Patient reports significant improvement in pain at this time. Tachycardia has resolved with pain meds and the suspect this was pain related all along. Discussed return precautions, need for orthopedic surgery follow-up, we'll discharge with short course of Percocet which he reports has been helpful for his pain in the past. He is comfortable with discharge plan.  Joanne Gavel, MD 11/02/14 [phone]

## 2014-11-02 NOTE — ED Notes (Signed)
AAOx3.  Skin warm and dry.  NAD. D/C home with family.

## 2014-11-02 NOTE — ED Notes (Signed)
States he had shoulder surgery in 12/15  Now having pain to shoulder with some numbness anterior.  Denies any c/p or SOB

## 2014-12-01 ENCOUNTER — Ambulatory Visit: Payer: Self-pay

## 2014-12-02 ENCOUNTER — Encounter (INDEPENDENT_AMBULATORY_CARE_PROVIDER_SITE_OTHER): Payer: Self-pay

## 2015-01-05 ENCOUNTER — Ambulatory Visit: Payer: Self-pay

## 2015-01-05 DIAGNOSIS — K219 Gastro-esophageal reflux disease without esophagitis: Secondary | ICD-10-CM | POA: Insufficient documentation

## 2015-01-05 LAB — BASIC METABOLIC PANEL
BUN: 10 mg/dL (ref 4–21)
Creatinine: 1 mg/dL (ref 0.6–1.3)
GLUCOSE: 97 mg/dL
Sodium: 142 mmol/L (ref 137–147)

## 2015-01-05 LAB — TSH: TSH: 2.18 u[IU]/mL (ref 0.41–5.90)

## 2015-01-05 LAB — CBC AND DIFFERENTIAL
NEUTROS ABS: 5 /uL
WBC: 9 10^3/mL

## 2015-02-04 ENCOUNTER — Ambulatory Visit: Payer: Self-pay

## 2015-03-30 ENCOUNTER — Emergency Department
Admission: EM | Admit: 2015-03-30 | Discharge: 2015-03-30 | Disposition: A | Discharge status: Home / Self Care | Payer: Self-pay | Attending: Emergency Medicine | Admitting: Emergency Medicine

## 2015-03-30 ENCOUNTER — Emergency Department: Payer: Self-pay

## 2015-03-30 DIAGNOSIS — Z79899 Other long term (current) drug therapy: Secondary | ICD-10-CM | POA: Insufficient documentation

## 2015-03-30 DIAGNOSIS — F4310 Post-traumatic stress disorder, unspecified: Secondary | ICD-10-CM | POA: Insufficient documentation

## 2015-03-30 DIAGNOSIS — I10 Essential (primary) hypertension: Secondary | ICD-10-CM | POA: Insufficient documentation

## 2015-03-30 DIAGNOSIS — R531 Weakness: Secondary | ICD-10-CM | POA: Insufficient documentation

## 2015-03-30 DIAGNOSIS — R000 Tachycardia, unspecified: Secondary | ICD-10-CM | POA: Insufficient documentation

## 2015-03-30 DIAGNOSIS — F17200 Nicotine dependence, unspecified, uncomplicated: Secondary | ICD-10-CM | POA: Insufficient documentation

## 2015-03-30 DIAGNOSIS — R251 Tremor, unspecified: Secondary | ICD-10-CM | POA: Insufficient documentation

## 2015-03-30 DIAGNOSIS — F1310 Sedative, hypnotic or anxiolytic abuse, uncomplicated: Secondary | ICD-10-CM | POA: Insufficient documentation

## 2015-03-30 HISTORY — DX: Tachycardia, unspecified: R00.0

## 2015-03-30 LAB — URINALYSIS COMPLETE WITH MICROSCOPIC (ARMC ONLY)
BILIRUBIN URINE: NEGATIVE
Bacteria, UA: NONE SEEN
Glucose, UA: 50 mg/dL — AB
Hgb urine dipstick: NEGATIVE
Ketones, ur: NEGATIVE mg/dL
LEUKOCYTES UA: NEGATIVE
Nitrite: NEGATIVE
Protein, ur: NEGATIVE mg/dL
SPECIFIC GRAVITY, URINE: 1.013 (ref 1.005–1.030)
SQUAMOUS EPITHELIAL / LPF: NONE SEEN
pH: 6 (ref 5.0–8.0)

## 2015-03-30 LAB — URINE DRUG SCREEN, QUALITATIVE (ARMC ONLY)
AMPHETAMINES, UR SCREEN: NOT DETECTED
Barbiturates, Ur Screen: NOT DETECTED
Benzodiazepine, Ur Scrn: POSITIVE — AB
CANNABINOID 50 NG, UR ~~LOC~~: NOT DETECTED
Cocaine Metabolite,Ur ~~LOC~~: NOT DETECTED
MDMA (ECSTASY) UR SCREEN: NOT DETECTED
Methadone Scn, Ur: NOT DETECTED
Opiate, Ur Screen: NOT DETECTED
PHENCYCLIDINE (PCP) UR S: NOT DETECTED
Tricyclic, Ur Screen: POSITIVE — AB

## 2015-03-30 LAB — CBC WITH DIFFERENTIAL/PLATELET
BASOS ABS: 0.1 10*3/uL (ref 0–0.1)
BASOS PCT: 1 %
EOS ABS: 0.1 10*3/uL (ref 0–0.7)
Eosinophils Relative: 1 %
HCT: 43.7 % (ref 40.0–52.0)
HEMOGLOBIN: 14.8 g/dL (ref 13.0–18.0)
LYMPHS ABS: 2.8 10*3/uL (ref 1.0–3.6)
Lymphocytes Relative: 32 %
MCH: 31.7 pg (ref 26.0–34.0)
MCHC: 33.9 g/dL (ref 32.0–36.0)
MCV: 93.4 fL (ref 80.0–100.0)
Monocytes Absolute: 0.6 10*3/uL (ref 0.2–1.0)
Monocytes Relative: 7 %
NEUTROS PCT: 59 %
Neutro Abs: 5.3 10*3/uL (ref 1.4–6.5)
PLATELETS: 201 10*3/uL (ref 150–440)
RBC: 4.68 MIL/uL (ref 4.40–5.90)
RDW: 14.2 % (ref 11.5–14.5)
WBC: 8.8 10*3/uL (ref 3.8–10.6)

## 2015-03-30 LAB — COMPREHENSIVE METABOLIC PANEL
ALK PHOS: 82 U/L (ref 38–126)
ALT: 43 U/L (ref 17–63)
AST: 38 U/L (ref 15–41)
Albumin: 4.1 g/dL (ref 3.5–5.0)
Anion gap: 9 (ref 5–15)
BUN: 14 mg/dL (ref 6–20)
CALCIUM: 9.9 mg/dL (ref 8.9–10.3)
CO2: 26 mmol/L (ref 22–32)
CREATININE: 1.13 mg/dL (ref 0.61–1.24)
Chloride: 102 mmol/L (ref 101–111)
Glucose, Bld: 159 mg/dL — ABNORMAL HIGH (ref 65–99)
Potassium: 3.5 mmol/L (ref 3.5–5.1)
Sodium: 137 mmol/L (ref 135–145)
TOTAL PROTEIN: 8.3 g/dL — AB (ref 6.5–8.1)
Total Bilirubin: 0.6 mg/dL (ref 0.3–1.2)

## 2015-03-30 LAB — TROPONIN I: Troponin I: <0.03 ng/mL (ref <0.031)

## 2015-03-30 LAB — TSH: TSH: 2.379 u[IU]/mL (ref 0.350–4.500)

## 2015-03-30 LAB — ETHANOL: Alcohol, Ethyl (B): 9 mg/dL — ABNORMAL HIGH (ref <5)

## 2015-03-30 MED ORDER — LORAZEPAM 2 MG/ML IJ SOLN
0.5000 mg | Freq: Once | INTRAMUSCULAR | Status: AC
Start: 2015-03-30 — End: 2015-03-30
  Administered 2015-03-30: 0.5 mg via INTRAVENOUS
  Filled 2015-03-30: qty 1

## 2015-03-30 NOTE — Discharge Instructions (Signed)
Your heart rate was elevated when he arrived. It is slowly come down with just IV fluids. You are also then treated with something to help reduce anxiety and given her usual dose of metoprolol. Your blood tests look good. You described how this is happened to many times in the past. Follow-up with her regular doctor for further evaluation. Return to emergency department if you have any chest pain, shortness of breath, all the patient's, or other urgent concerns.  Nonspecific Tachycardia Tachycardia is a faster than normal heartbeat (more than 100 beats per minute). In adults, the heart normally beats between 60 and 100 times a minute. A fast heartbeat may be a normal response to exercise or stress. It does not necessarily mean that something is wrong. However, sometimes when your heart beats too fast it may not be able to pump enough blood to the rest of your body. This can result in chest pain, shortness of breath, dizziness, and even fainting. Nonspecific tachycardia means that the specific cause or pattern of your tachycardia is unknown. CAUSES  Tachycardia may be harmless or it may be due to a more serious underlying cause. Possible causes of tachycardia include:  Exercise or exertion.  Fever.  Pain or injury.  Infection.  Loss of body fluids (dehydration).  Overactive thyroid.  Lack of red blood cells (anemia).  Anxiety and stress.  Alcohol.  Caffeine.  Tobacco products.  Diet pills.  Illegal drugs.  Heart disease. SYMPTOMS  Rapid or irregular heartbeat (palpitations).  Suddenly feeling your heart beating (cardiac awareness).  Dizziness.  Tiredness (fatigue).  Shortness of breath.  Chest pain.  Nausea.  Fainting. DIAGNOSIS  Your caregiver will perform a physical exam and take your medical history. In some cases, a heart specialist (cardiologist) may be consulted. Your caregiver may also order:  Blood tests.  Electrocardiography. This test records the  electrical activity of your heart.  A heart monitoring test. TREATMENT  Treatment will depend on the likely cause of your tachycardia. The goal is to treat the underlying cause of your tachycardia. Treatment methods may include:  Replacement of fluids or blood through an intravenous (IV) tube for moderate to severe dehydration or anemia.  New medicines or changes in your current medicines.  Diet and lifestyle changes.  Treatment for certain infections.  Stress relief or relaxation methods. HOME CARE INSTRUCTIONS   Rest.  Drink enough fluids to keep your urine clear or pale yellow.  Do not smoke.  Avoid:  Caffeine.  Tobacco.  Alcohol.  Chocolate.  Stimulants such as over-the-counter diet pills or pills that help you stay awake.  Situations that cause anxiety or stress.  Illegal drugs such as marijuana, phencyclidine (PCP), and cocaine.  Only take medicine as directed by your caregiver.  Keep all follow-up appointments as directed by your caregiver. SEEK IMMEDIATE MEDICAL CARE IF:   You have pain in your chest, upper arms, jaw, or neck.  You become weak, dizzy, or feel faint.  You have palpitations that will not go away.  You vomit, have diarrhea, or pass blood in your stool.  Your skin is cool, pale, and wet.  You have a fever that will not go away with rest, fluids, and medicine. MAKE SURE YOU:   Understand these instructions.  Will watch your condition.  Will get help right away if you are not doing well or get worse.   This information is not intended to replace advice given to you by your health care provider. Make sure you  discuss any questions you have with your health care provider.   Document Released: 05/18/2004 Document Revised: 07/03/2011 Document Reviewed: 10/23/2014 Elsevier Interactive Patient Education [employer].

## 2015-03-30 NOTE — ED Provider Notes (Signed)
[facility] Emergency Department Provider Note  ____________________________________________  Time seen: 1555  I have reviewed the triage vital signs and the nursing notes.  History by:  Patient  HISTORY  Chief Complaint Dizziness  tachycardia, hypertension   HPI Raymond [name] is a 49 y.o. male who went to [place] today for refills on his medicines for his PTSD. While there, they checked his vital signs and found his blood pressure be elevated and his heart rate be elevated. He reports he feels weak and a little shaky. He reports this is happened in the past, numerous times over the past few years.   He denies any chest pain. He denies any palpitations. He does report that he has had bright blood per rectum recently.   Past Medical History  Diagnosis Date  . Hypertension   . Irregular heart beat unk  . Tachycardia     There are no active problems to display for this patient.   Past Surgical History  Procedure Laterality Date  . Shoulder surgery Right     Current Outpatient Rx  Name  Route  Sig  Dispense  Refill  . amLODipine (NORVASC) 5 MG tablet   Oral   Take 5 mg by mouth daily.         . hydrochlorothiazide (HYDRODIURIL) 25 MG tablet   Oral   Take 25 mg by mouth daily.          . metoprolol (LOPRESSOR) 50 MG tablet   Oral   Take 75 mg by mouth 2 (two) times daily.         . mirtazapine (REMERON) 45 MG tablet   Oral   Take 45 mg by mouth at bedtime.         Marland Kitchen omeprazole (PRILOSEC) 20 MG capsule   Oral   Take 20 mg by mouth daily.         . QUEtiapine (SEROQUEL XR) 50 MG TB24 24 hr tablet   Oral   Take 50-200 mg by mouth 3 (three) times daily. Pt takes one tablet in the morning, one tablet at noon, and four tablets at bedtime.         . ranitidine (ZANTAC) 150 MG tablet   Oral   Take 150 mg by mouth 2 (two) times daily.         . simvastatin (ZOCOR) 40 MG tablet   Oral   Take 40 mg by mouth at bedtime.          . traZODone (DESYREL) 100 MG tablet   Oral   Take 300 mg by mouth at bedtime.         Marland Kitchen oxyCODONE (ROXICODONE) 5 MG immediate release tablet   Oral   Take 1 tablet (5 mg total) by mouth every 6 (six) hours as needed for moderate pain. Do not drive while taking this medication. Patient not taking: Reported on 03/30/2015   12 tablet   0     Allergies Onion and Norco  No family history on file.  Social History Social History  Substance Use Topics  . Smoking status: Current Every Day Smoker  . Smokeless tobacco: None  . Alcohol Use: No    Review of Systems  Constitutional: Negative for fever/chills. Positive for general shakiness and weakness. ENT: Negative for congestion. Cardiovascular: Notable tachycardia and arrival. History of similar. She history of present illness Respiratory: Negative for cough. Gastrointestinal: Negative for abdominal pain, vomiting and diarrhea. Genitourinary: Negative for dysuria. Musculoskeletal: No  back pain. Skin: Negative for rash. Neurological: Negative for headache or focal weakness Psychiatric: History of PTSD.  10-point ROS otherwise negative.  ____________________________________________   PHYSICAL EXAM:  VITAL SIGNS: ED Triage Vitals  Enc Vitals Group     BP 03/30/15 1550 162/92 mmHg     Pulse Rate 03/30/15 1550 150     Resp 03/30/15 1550 18     Temp 03/30/15 1550 98.3 F (36.8 C)     Temp Source 03/30/15 1550 Oral     SpO2 03/30/15 1550 96 %     Weight 03/30/15 1550 259 lb (117.482 kg)     Height 03/30/15 1550 5\' 9"  (1.753 m)     Head Cir --      Peak Flow --      Pain Score --      Pain Loc --      Pain Edu? --      Excl. in [place]? --     Constitutional: Alert and oriented, reactive.  ENT   Head: Normocephalic and atraumatic.   Nose: No congestion/rhinnorhea.       Mouth: No erythema, no swelling   Cardiovascular: Tachycardic at 140, regular rhythm, no murmur noted Respiratory:  Normal respiratory  effort, no tachypnea.    Breath sounds are clear and equal bilaterally.  Gastrointestinal: Soft, no distention. Nontender Back: No muscle spasm, no tenderness, no CVA tenderness. Musculoskeletal: No deformity noted. Nontender with normal range of motion in all extremities.  No noted edema. Neurologic:  Communicative. Normal appearing spontaneous movement in all 4 extremities. No gross focal neurologic deficits are appreciated.  Skin:  Skin is warm, dry. No rash noted. Psychiatric: Mood and affect are normal. Speech and behavior are normal.  ____________________________________________    LABS (pertinent positives/negatives)  Labs Reviewed  ETHANOL - Abnormal; Notable for the following:    Alcohol, Ethyl (B) 9 (*)    All other components within normal limits  COMPREHENSIVE METABOLIC PANEL - Abnormal; Notable for the following:    Glucose, Bld 159 (*)    Total Protein 8.3 (*)    All other components within normal limits  URINALYSIS COMPLETEWITH MICROSCOPIC ([facility] ONLY) - Abnormal; Notable for the following:    Color, Urine YELLOW (*)    APPearance CLEAR (*)    Glucose, UA 50 (*)    All other components within normal limits  URINE DRUG SCREEN, QUALITATIVE ([facility] ONLY) - Abnormal; Notable for the following:    Tricyclic, Ur Screen POSITIVE (*)    Benzodiazepine, Ur Scrn POSITIVE (*)    All other components within normal limits  TROPONIN I  CBC WITH DIFFERENTIAL/PLATELET  TSH     ____________________________________________   EKG  ED ECG REPORT I, Ragan Duhon W, the attending physician, personally viewed and interpreted this ECG.   Date: 03/30/2015  EKG Time: 1551  Rate: 143  Rhythm: Normal sinus rhythm  Axis: Normal  Intervals: Normal  ST&T Change: None noted   ____________________________________________    RADIOLOGY  Chest x-ray: IMPRESSION: No edema or  consolidation.  ____________________________________________   PROCEDURES    ____________________________________________   INITIAL IMPRESSION / ASSESSMENT AND PLAN / ED COURSE  Pertinent labs & imaging results that were available during my care of the patient were reviewed by me and considered in my medical decision making (see chart for details).  49 year old male with some general weakness, feeling shaky, who had noted elevated blood pressure and heart rate when evaluated at the mental health clinic today. Patient's heart rate was  143 on our EKG. He reports she has had episodes in the past of fast heart rate, including comments from paramedics who said they may need to shock him. The patient is not in SVT, but sinus tachycardia. He appears stable currently. He does report rectal bleeding. We will evaluate his blood count for possible anemia, treated him with normal saline, and further evaluate.  ----------------------------------------- 5:24 PM on 03/30/2015 -----------------------------------------  Patient's blood work overall looks good. He is not anemic. His TSH level is pending. Urine drug screen pending.   His heart rate is now 110 to 115. We will continue further fluid resuscitation. I will also treat the patient with a little bit of Ativan in case this is an anxiety issue. Overall he looks well.  ----------------------------------------- 5:53 PM on 03/30/2015 -----------------------------------------  On repeat exam, the patient looks well. He again confirms he did not have any chest pain before. He has no chest pain or shortness of breath now. His heart rate is down down to 105-110. I have reviewed his medications again. The patient takes metoprolol, 75 mg, twice a day. We will give him his evening dose of metoprolol now.    Patient denies any unilateral swelling or history of DVT. He has had no clinical symptoms of a PE.   TSH is pending.  We'll allow the liter of  normal saline to finish. If his heart rate remains at 105 or below, we will discharge him home.  ----------------------------------------- 7:31 PM on 03/30/2015 -----------------------------------------  Patient's heart rate is now in the 80s with a reasonable blood pressure. He feels comfortable. We will discharge him home to continue with his current medications. It is unclear what triggered this tachycardia earlier. As noted, he has had episodes like this before. He appears to be stable. I've asked him to follow up with his usual caregivers and counseled him to return the emergency department if he has palpitations, chest pain, shortness of breath, or other urgent concerns.  ____________________________________________   FINAL CLINICAL IMPRESSION(S) / ED DIAGNOSES  Final diagnoses:  Sinus tachycardia ([place])  Weakness       Ahmed Prima, MD 03/30/15 [phone]

## 2015-03-30 NOTE — ED Notes (Signed)
Pt states he was at [facility] for a routine visit when they took his b/p it was elevated and referred pt to the ED.the patient c/o intermittent dizziness with blurred vision today. HR 150 in triage.. Pt denies chest pain or SOB.Marland Kitchen

## 2015-04-08 ENCOUNTER — Ambulatory Visit: Payer: Self-pay

## 2015-04-08 DIAGNOSIS — I10 Essential (primary) hypertension: Secondary | ICD-10-CM | POA: Insufficient documentation

## 2015-04-08 DIAGNOSIS — F32A Depression, unspecified: Secondary | ICD-10-CM | POA: Insufficient documentation

## 2015-04-08 DIAGNOSIS — E1159 Type 2 diabetes mellitus with other circulatory complications: Secondary | ICD-10-CM | POA: Insufficient documentation

## 2015-04-08 DIAGNOSIS — F329 Major depressive disorder, single episode, unspecified: Secondary | ICD-10-CM | POA: Insufficient documentation

## 2015-04-25 HISTORY — PX: COLONOSCOPY: SHX174

## 2015-04-25 HISTORY — PX: UPPER GI ENDOSCOPY: SHX6162

## 2015-06-11 DIAGNOSIS — I10 Essential (primary) hypertension: Secondary | ICD-10-CM

## 2015-06-11 DIAGNOSIS — F329 Major depressive disorder, single episode, unspecified: Secondary | ICD-10-CM

## 2015-06-11 DIAGNOSIS — F32A Depression, unspecified: Secondary | ICD-10-CM

## 2015-06-11 DIAGNOSIS — K219 Gastro-esophageal reflux disease without esophagitis: Secondary | ICD-10-CM

## 2015-06-14 DIAGNOSIS — M47812 Spondylosis without myelopathy or radiculopathy, cervical region: Secondary | ICD-10-CM | POA: Insufficient documentation

## 2015-07-15 ENCOUNTER — Other Ambulatory Visit: Payer: Self-pay

## 2015-07-15 DIAGNOSIS — E079 Disorder of thyroid, unspecified: Secondary | ICD-10-CM

## 2015-07-15 DIAGNOSIS — I10 Essential (primary) hypertension: Secondary | ICD-10-CM

## 2015-07-15 DIAGNOSIS — E669 Obesity, unspecified: Secondary | ICD-10-CM

## 2015-07-15 DIAGNOSIS — E119 Type 2 diabetes mellitus without complications: Secondary | ICD-10-CM

## 2015-07-16 LAB — COMPREHENSIVE METABOLIC PANEL
ALT: 40 IU/L (ref 0–44)
AST: 29 IU/L (ref 0–40)
Albumin/Globulin Ratio: 1.3 (ref 1.2–2.2)
Albumin: 4.1 g/dL (ref 3.5–5.5)
Alkaline Phosphatase: 133 IU/L — ABNORMAL HIGH (ref 39–117)
BUN/Creatinine Ratio: 12 (ref 9–20)
BUN: 12 mg/dL (ref 6–24)
Bilirubin Total: 0.2 mg/dL (ref 0.0–1.2)
CALCIUM: 9.7 mg/dL (ref 8.7–10.2)
CHLORIDE: 95 mmol/L — AB (ref 96–106)
CO2: 23 mmol/L (ref 18–29)
Creatinine, Ser: 1 mg/dL (ref 0.76–1.27)
GFR, EST AFRICAN AMERICAN: 101 mL/min/{1.73_m2} (ref >59)
GFR, EST NON AFRICAN AMERICAN: 87 mL/min/{1.73_m2} (ref >59)
GLUCOSE: 171 mg/dL — AB (ref 65–99)
Globulin, Total: 3.2 g/dL (ref 1.5–4.5)
POTASSIUM: 4.1 mmol/L (ref 3.5–5.2)
Sodium: 138 mmol/L (ref 134–144)
TOTAL PROTEIN: 7.3 g/dL (ref 6.0–8.5)

## 2015-07-16 LAB — LIPID PANEL
CHOL/HDL RATIO: 4.8 ratio (ref 0.0–5.0)
Cholesterol, Total: 134 mg/dL (ref 100–199)
HDL: 28 mg/dL — AB (ref >39)
LDL Calculated: 61 mg/dL (ref 0–99)
TRIGLYCERIDES: 227 mg/dL — AB (ref 0–149)
VLDL CHOLESTEROL CAL: 45 mg/dL — AB (ref 5–40)

## 2015-07-16 LAB — HEMOGLOBIN A1C
ESTIMATED AVERAGE GLUCOSE: 197 mg/dL
HEMOGLOBIN A1C: 8.5 % — AB (ref 4.8–5.6)

## 2015-07-16 LAB — TSH: TSH: 2 u[IU]/mL (ref 0.450–4.500)

## 2015-07-22 ENCOUNTER — Ambulatory Visit: Payer: Self-pay

## 2015-08-12 DIAGNOSIS — G4733 Obstructive sleep apnea (adult) (pediatric): Secondary | ICD-10-CM | POA: Insufficient documentation

## 2015-08-24 ENCOUNTER — Ambulatory Visit: Payer: Self-pay

## 2016-01-10 ENCOUNTER — Encounter: Payer: Self-pay | Admitting: Medical Oncology

## 2016-01-10 ENCOUNTER — Emergency Department
Admission: EM | Admit: 2016-01-10 | Discharge: 2016-01-10 | Disposition: A | Discharge status: Home / Self Care | Payer: Self-pay | Attending: Student in an Organized Health Care Education/Training Program | Admitting: Student in an Organized Health Care Education/Training Program

## 2016-01-10 ENCOUNTER — Emergency Department: Payer: Self-pay

## 2016-01-10 DIAGNOSIS — Y9389 Activity, other specified: Secondary | ICD-10-CM | POA: Insufficient documentation

## 2016-01-10 DIAGNOSIS — F17200 Nicotine dependence, unspecified, uncomplicated: Secondary | ICD-10-CM | POA: Insufficient documentation

## 2016-01-10 DIAGNOSIS — Y929 Unspecified place or not applicable: Secondary | ICD-10-CM | POA: Insufficient documentation

## 2016-01-10 DIAGNOSIS — W208XXA Other cause of strike by thrown, projected or falling object, initial encounter: Secondary | ICD-10-CM | POA: Insufficient documentation

## 2016-01-10 DIAGNOSIS — I10 Essential (primary) hypertension: Secondary | ICD-10-CM | POA: Insufficient documentation

## 2016-01-10 DIAGNOSIS — Y999 Unspecified external cause status: Secondary | ICD-10-CM | POA: Insufficient documentation

## 2016-01-10 DIAGNOSIS — I252 Old myocardial infarction: Secondary | ICD-10-CM | POA: Insufficient documentation

## 2016-01-10 DIAGNOSIS — S60212A Contusion of left wrist, initial encounter: Secondary | ICD-10-CM | POA: Insufficient documentation

## 2016-01-10 DIAGNOSIS — Z79899 Other long term (current) drug therapy: Secondary | ICD-10-CM | POA: Insufficient documentation

## 2016-01-10 MED ORDER — MELOXICAM 15 MG PO TABS: 15.0000 mg | ORAL_TABLET | Freq: Every day | ORAL | 0 refills | Status: DC

## 2016-01-10 MED ORDER — KETOROLAC TROMETHAMINE 30 MG/ML IJ SOLN
30.0000 mg | Freq: Once | INTRAMUSCULAR | Status: AC
  Administered 2016-01-10: 30 mg via INTRAMUSCULAR
  Filled 2016-01-10: qty 1

## 2016-01-10 NOTE — ED Triage Notes (Signed)
Pt was working on a car when the hood fell onto left wrist. Pt reports pain.

## 2016-01-10 NOTE — ED Provider Notes (Signed)
[facility] Emergency Department Provider Note  ____________________________________________  Time seen: Approximately 3:56 PM  I have reviewed the triage vital signs and the nursing notes.   HISTORY  Chief Complaint Wrist Pain    HPI Raymond [name] is a 50 y.o. male who presents emergency department complaining of left wrist pain. Patient states that he was working on his car when he put his hand into the engine compartment. He reports that the foot of the car fell closed and landed on his wrist. He reports pain and swelling to area. No previous history of injury or surgeries to area. No other complaint. Pain is moderate to severe with movement, he has had no medications for this complaint prior to arrival.   Past Medical History:  Diagnosis Date  . Anxiety   . Depression   . GERD (gastroesophageal reflux disease)   . Headache   . Hypertension   . Irregular heart beat unk  . Myocardial infarction ([place])   . Tachycardia     Patient Active Problem List   Diagnosis Date Noted  . Essential hypertension 04/08/2015  . Depression 04/08/2015  . GERD (gastroesophageal reflux disease) 01/05/2015    Past Surgical History:  Procedure Laterality Date  . SHOULDER SURGERY Right     Prior to Admission medications   Medication Sig Start Date End Date Taking? Authorizing Provider  amLODipine (NORVASC) 5 MG tablet Take 5 mg by mouth daily.    Historical Provider, MD  hydrochlorothiazide (HYDRODIURIL) 25 MG tablet Take 25 mg by mouth daily.     Historical Provider, MD  hydrOXYzine (VISTARIL) 50 MG capsule Take 50 mg by mouth 2 (two) times daily as needed.    Historical Provider, MD  meloxicam (MOBIC) 15 MG tablet Take 1 tablet (15 mg total) by mouth daily. 01/10/16   Charline Bills Aigner Horseman, PA-C  metoprolol (LOPRESSOR) 50 MG tablet Take 75 mg by mouth 2 (two) times daily.    Historical Provider, MD  mirtazapine (REMERON) 45 MG tablet Take 45 mg by mouth at  bedtime.    Historical Provider, MD  omeprazole (PRILOSEC) 20 MG capsule Take 20 mg by mouth daily.    Historical Provider, MD  oxyCODONE (ROXICODONE) 5 MG immediate release tablet Take 1 tablet (5 mg total) by mouth every 6 (six) hours as needed for moderate pain. Do not drive while taking this medication. Patient not taking: Reported on 03/30/2015 11/02/14   Joanne Gavel, MD  QUEtiapine (SEROQUEL XR) 50 MG TB24 24 hr tablet Take 50-200 mg by mouth 3 (three) times daily. Pt takes one tablet in the morning, one tablet at noon, and four tablets at bedtime.    Historical Provider, MD  ranitidine (ZANTAC) 150 MG tablet Take 150 mg by mouth 2 (two) times daily.    Historical Provider, MD  simvastatin (ZOCOR) 40 MG tablet Take 40 mg by mouth at bedtime.    Historical Provider, MD  traZODone (DESYREL) 100 MG tablet Take 300 mg by mouth at bedtime.    Historical Provider, MD    Allergies Onion and Norco [hydrocodone-acetaminophen]  Family History  Problem Relation Age of Onset  . Hypertension Mother   . Diabetes type II Mother     Social History Social History  Substance Use Topics  . Smoking status: Current Every Day Smoker    Packs/day: 1.00  . Smokeless tobacco: Not on file  . Alcohol use No     Review of Systems  Constitutional: No fever/chills Cardiovascular: no  chest pain. Respiratory: no cough. No SOB. Musculoskeletal: Positive for left wrist pain Skin: Negative for rash, abrasions, lacerations, ecchymosis. Neurological: Negative for headaches, focal weakness or numbness. 10-point ROS otherwise negative.  ____________________________________________   PHYSICAL EXAM:  VITAL SIGNS: ED Triage Vitals  Enc Vitals Group     BP 01/10/16 1458 (!) 171/99     Pulse Rate 01/10/16 1458 (!) 115     Resp 01/10/16 1458 18     Temp 01/10/16 1458 98.2 F (36.8 C)     Temp Source 01/10/16 1458 Oral     SpO2 01/10/16 1458 96 %     Weight 01/10/16 1457 240 lb (108.9 kg)     Height  01/10/16 1457 5\' 9"  (1.753 m)     Head Circumference --      Peak Flow --      Pain Score 01/10/16 1457 10     Pain Loc --      Pain Edu? --      Excl. in [place]? --      Constitutional: Alert and oriented. Well appearing and in no acute distress. Eyes: Conjunctivae are normal. PERRL. EOMI. Head: Atraumatic. Cardiovascular: Normal rate, regular rhythm. Normal S1 and S2.  Good peripheral circulation. Respiratory: Normal respiratory effort without tachypnea or retractions. Lungs CTAB. Good air entry to the bases with no decreased or absent breath sounds. Musculoskeletal: Full range of motion to all extremities. No gross deformities appreciated. No gross deformities noted to left wrist. Mild edema is noted to the dorsal aspect of the wrist. No abrasions or lacerations. Full range of motion to wrist. Patient is very tender to palpation over the distal radius. No palpable abnormality. Radial pulses and cap refill intact 5 digits. Sensation intact 5 digits. Full range of motion 5 digits. Neurologic:  Normal speech and language. No gross focal neurologic deficits are appreciated.  Skin:  Skin is warm, dry and intact. No rash noted. Psychiatric: Mood and affect are normal. Speech and behavior are normal. Patient exhibits appropriate insight and judgement.   ____________________________________________   LABS (all labs ordered are listed, but only abnormal results are displayed)  Labs Reviewed - No data to display ____________________________________________  EKG   ____________________________________________  RADIOLOGY Diamantina Providence Zalma Channing, personally viewed and evaluated these images (plain radiographs) as part of my medical decision making, as well as reviewing the written report by the radiologist.  Dg Wrist Complete Left  Result Date: 01/10/2016 CLINICAL DATA:  Tessie Fass of car fell on wrist earlier today.  Pain. EXAM: LEFT WRIST - COMPLETE 3+ VIEW COMPARISON:  None. FINDINGS: There  is no evidence of fracture or dislocation. There is no evidence of arthropathy or other focal bone abnormality. Soft tissues are unremarkable. IMPRESSION: Negative. Electronically Signed   By: Rolm Baptise M.D.   On: 01/10/2016 16:45    ____________________________________________    PROCEDURES  Procedure(s) performed:    Procedures    Medications  ketorolac (TORADOL) 30 MG/ML injection 30 mg (not administered)     ____________________________________________   INITIAL IMPRESSION / ASSESSMENT AND PLAN / ED COURSE  Pertinent labs & imaging results that were available during my care of the patient were reviewed by me and considered in my medical decision making (see chart for details).  Review of the [place] CSRS was performed in accordance of the [place] prior to dispensing any controlled drugs.  Clinical Course    Patient's diagnosis is consistent with Left wrist contusion. Exam is reassuring but x-ray was obtained to  rule out osseous abnormality. X-ray reveals no acute osseous abnormality.. Patient will be discharged home with prescriptions for anti-inflammatories for symptom control. Patient is given a wrist brace for symptom management.. Patient is to follow up with orthopedics as needed or otherwise directed. Patient is given ED precautions to return to the ED for any worsening or new symptoms.     ____________________________________________  FINAL CLINICAL IMPRESSION(S) / ED DIAGNOSES  Final diagnoses:  Wrist contusion, left, initial encounter      NEW MEDICATIONS STARTED DURING THIS VISIT:  New Prescriptions   MELOXICAM (MOBIC) 15 MG TABLET    Take 1 tablet (15 mg total) by mouth daily.        This chart was dictated using voice recognition software/Dragon. Despite best efforts to proofread, errors can occur which can change the meaning. Any change was purely unintentional.    Darletta Moll, PA-C 01/10/16 [place], MD 01/11/16  [phone]

## 2016-05-16 DIAGNOSIS — I252 Old myocardial infarction: Secondary | ICD-10-CM | POA: Diagnosis not present

## 2016-05-16 DIAGNOSIS — G4733 Obstructive sleep apnea (adult) (pediatric): Secondary | ICD-10-CM | POA: Diagnosis not present

## 2016-05-16 DIAGNOSIS — F17210 Nicotine dependence, cigarettes, uncomplicated: Secondary | ICD-10-CM | POA: Diagnosis not present

## 2016-05-16 DIAGNOSIS — R490 Dysphonia: Secondary | ICD-10-CM | POA: Diagnosis not present

## 2016-05-16 DIAGNOSIS — Z79899 Other long term (current) drug therapy: Secondary | ICD-10-CM | POA: Diagnosis not present

## 2016-05-16 DIAGNOSIS — I10 Essential (primary) hypertension: Secondary | ICD-10-CM | POA: Diagnosis not present

## 2016-05-16 DIAGNOSIS — R05 Cough: Secondary | ICD-10-CM | POA: Diagnosis not present

## 2016-05-16 DIAGNOSIS — R1310 Dysphagia, unspecified: Secondary | ICD-10-CM | POA: Diagnosis not present

## 2016-05-16 DIAGNOSIS — Z7982 Long term (current) use of aspirin: Secondary | ICD-10-CM | POA: Diagnosis not present

## 2016-05-16 DIAGNOSIS — K219 Gastro-esophageal reflux disease without esophagitis: Secondary | ICD-10-CM | POA: Diagnosis not present

## 2016-05-16 DIAGNOSIS — R069 Unspecified abnormalities of breathing: Secondary | ICD-10-CM | POA: Diagnosis not present

## 2016-06-23 DIAGNOSIS — F332 Major depressive disorder, recurrent severe without psychotic features: Secondary | ICD-10-CM | POA: Diagnosis not present

## 2016-11-02 DIAGNOSIS — F332 Major depressive disorder, recurrent severe without psychotic features: Secondary | ICD-10-CM | POA: Diagnosis not present

## 2016-11-21 DIAGNOSIS — I209 Angina pectoris, unspecified: Secondary | ICD-10-CM | POA: Diagnosis not present

## 2016-11-21 DIAGNOSIS — F17210 Nicotine dependence, cigarettes, uncomplicated: Secondary | ICD-10-CM | POA: Diagnosis not present

## 2016-11-21 DIAGNOSIS — E1165 Type 2 diabetes mellitus with hyperglycemia: Secondary | ICD-10-CM | POA: Diagnosis not present

## 2016-11-21 DIAGNOSIS — E785 Hyperlipidemia, unspecified: Secondary | ICD-10-CM | POA: Diagnosis not present

## 2016-11-21 DIAGNOSIS — I10 Essential (primary) hypertension: Secondary | ICD-10-CM | POA: Diagnosis not present

## 2016-11-21 DIAGNOSIS — F5105 Insomnia due to other mental disorder: Secondary | ICD-10-CM | POA: Diagnosis not present

## 2016-11-23 ENCOUNTER — Telehealth: Payer: Self-pay | Admitting: Pharmacy Technician

## 2016-11-23 NOTE — Telephone Encounter (Signed)
Patient now has Medicaid.  No additional medication assistance will be provided by [facility]  Patient notified by letter.  [facility]

## 2016-12-12 DIAGNOSIS — E785 Hyperlipidemia, unspecified: Secondary | ICD-10-CM | POA: Diagnosis not present

## 2016-12-12 DIAGNOSIS — I10 Essential (primary) hypertension: Secondary | ICD-10-CM | POA: Diagnosis not present

## 2016-12-12 DIAGNOSIS — E1165 Type 2 diabetes mellitus with hyperglycemia: Secondary | ICD-10-CM | POA: Diagnosis not present

## 2017-01-16 DIAGNOSIS — F332 Major depressive disorder, recurrent severe without psychotic features: Secondary | ICD-10-CM | POA: Diagnosis not present

## 2017-01-23 DIAGNOSIS — F332 Major depressive disorder, recurrent severe without psychotic features: Secondary | ICD-10-CM | POA: Diagnosis not present

## 2017-01-30 DIAGNOSIS — F332 Major depressive disorder, recurrent severe without psychotic features: Secondary | ICD-10-CM | POA: Diagnosis not present

## 2017-02-26 DIAGNOSIS — F332 Major depressive disorder, recurrent severe without psychotic features: Secondary | ICD-10-CM | POA: Diagnosis not present

## 2017-03-20 DIAGNOSIS — F332 Major depressive disorder, recurrent severe without psychotic features: Secondary | ICD-10-CM | POA: Diagnosis not present

## 2017-03-27 DIAGNOSIS — F332 Major depressive disorder, recurrent severe without psychotic features: Secondary | ICD-10-CM | POA: Diagnosis not present

## 2017-05-07 ENCOUNTER — Other Ambulatory Visit: Payer: Self-pay

## 2017-05-07 MED ORDER — ACCU-CHEK FASTCLIX LANCETS MISC: 1 refills | Status: DC

## 2017-05-21 ENCOUNTER — Encounter: Payer: Self-pay | Admitting: Nurse Practitioner

## 2017-05-21 ENCOUNTER — Ambulatory Visit: Payer: Medicare Other | Admitting: Nurse Practitioner

## 2017-05-21 VITALS — BP 134/95 | HR 119 | Resp 16 | Ht 69.0 in | Wt 250.8 lb

## 2017-05-21 DIAGNOSIS — I10 Essential (primary) hypertension: Secondary | ICD-10-CM | POA: Diagnosis not present

## 2017-05-21 DIAGNOSIS — E1165 Type 2 diabetes mellitus with hyperglycemia: Secondary | ICD-10-CM | POA: Diagnosis not present

## 2017-05-21 DIAGNOSIS — K219 Gastro-esophageal reflux disease without esophagitis: Secondary | ICD-10-CM

## 2017-05-21 MED ORDER — OMEPRAZOLE 40 MG PO CPDR: 40.0000 mg | DELAYED_RELEASE_CAPSULE | Freq: Every day | ORAL | 3 refills | Status: DC

## 2017-05-21 MED ORDER — METOPROLOL TARTRATE 100 MG PO TABS: 100.0000 mg | ORAL_TABLET | Freq: Two times a day (BID) | ORAL | 3 refills | Status: DC

## 2017-05-21 MED ORDER — RANITIDINE HCL 150 MG PO TABS: 150.0000 mg | ORAL_TABLET | Freq: Two times a day (BID) | ORAL | 3 refills | Status: DC

## 2017-05-21 MED ORDER — GLIMEPIRIDE 4 MG PO TABS: 4.0000 mg | ORAL_TABLET | Freq: Every day | ORAL | 3 refills | Status: DC

## 2017-05-21 MED ORDER — SITAGLIPTIN PHOS-METFORMIN HCL 50-500 MG PO TABS: 1.0000 | ORAL_TABLET | Freq: Two times a day (BID) | ORAL | 3 refills | Status: DC

## 2017-05-21 NOTE — Progress Notes (Addendum)
[facility], [place] [address]  Internal MEDICINE  Office Visit Note  Patient Name: [name]  [id number]  [id number]  Date of Service: 05/30/2017  Chief Complaint  Patient presents with  . Gastroesophageal Reflux    at night, wakes up from choking  . Knee Pain    right knee swollen, tightness, popping  . Diabetes    blood sugars running between 250 and 300    Gastroesophageal Reflux  He complains of abdominal pain, belching, coughing, dysphagia and heartburn. He reports no chest pain, no nausea, no sore throat or no wheezing. This is a chronic problem. The current episode started more than 1 month ago. The problem occurs frequently. The problem has been gradually worsening. The heartburn is located in the substernum and abdomen. The heartburn is of moderate intensity. The heartburn wakes him from sleep. The heartburn limits his activity. The symptoms are aggravated by lying down. Associated symptoms include orthopnea. Risk factors include smoking/tobacco exposure, obesity, caffeine use and lack of exercise. He has tried a PPI, a histamine-2 antagonist and an antacid for the symptoms. The treatment provided mild relief.  Knee Pain   The incident occurred more than 1 week ago. The incident occurred at home. There was no injury mechanism. The pain is present in the right knee. The pain is at a severity of 7/10. The pain is moderate. The pain has been intermittent since onset. Associated symptoms include muscle weakness. He reports no foreign bodies present. The symptoms are aggravated by movement and weight bearing. He has tried nothing for the symptoms.  Diabetes  He presents for his follow-up diabetic visit. He has type 2 diabetes mellitus. No MedicAlert identification noted. His disease course has been worsening. Hypoglycemia symptoms include dizziness and nervousness/anxiousness. Pertinent negatives for hypoglycemia include no pallor. Associated  symptoms include polydipsia, polyuria and weakness. Pertinent negatives for diabetes include no chest pain. Symptoms are worsening. There are no diabetic complications. Risk factors for coronary artery disease include tobacco exposure and hypertension. Current diabetic treatment includes oral agent (monotherapy). He is compliant with treatment some of the time. His weight is stable. He is following a diabetic diet. Meal planning includes ADA exchanges. He has not had a previous visit with a dietitian. He participates in exercise intermittently. His home blood glucose trend is increasing steadily. He does not see a podiatrist.Eye exam is not current.    Pt is here for routine follow up.    Current Medication: Outpatient Encounter Medications as of 05/21/2017  Medication Sig  . ACCU-CHEK FASTCLIX LANCETS MISC yse as directed twice a day  . amLODipine (NORVASC) 5 MG tablet Take 5 mg by mouth daily.  Marland Kitchen glimepiride (AMARYL) 4 MG tablet Take 1 tablet (4 mg total) by mouth daily with breakfast.  . hydrochlorothiazide (HYDRODIURIL) 25 MG tablet Take 25 mg by mouth daily.   . hydrOXYzine (VISTARIL) 50 MG capsule Take 50 mg by mouth 2 (two) times daily as needed.  . metoprolol tartrate (LOPRESSOR) 100 MG tablet Take 1 tablet (100 mg total) by mouth 2 (two) times daily.  . mirtazapine (REMERON) 45 MG tablet Take 45 mg by mouth at bedtime.  . nitroGLYCERIN (NITROSTAT) 0.4 MG SL tablet Place 0.4 mg under the tongue every 5 (five) minutes as needed for chest pain.  Marland Kitchen omeprazole (PRILOSEC) 40 MG capsule Take 1 capsule (40 mg total) by mouth daily.  . QUEtiapine (SEROQUEL XR) 50 MG TB24 24 hr tablet Take 50-200 mg by  mouth 3 (three) times daily. Pt takes one tablet in the morning, one tablet at noon, and four tablets at bedtime.  . ranitidine (ZANTAC) 150 MG tablet Take 1 tablet (150 mg total) by mouth 2 (two) times daily.  . simvastatin (ZOCOR) 40 MG tablet Take 40 mg by mouth at bedtime.  .  sitaGLIPtin-metformin (JANUMET) 50-500 MG tablet Take 1 tablet by mouth 2 (two) times daily.  . traZODone (DESYREL) 100 MG tablet Take 300 mg by mouth at bedtime.  . [DISCONTINUED] glimepiride (AMARYL) 2 MG tablet Take 2 mg by mouth 2 (two) times daily.  . [DISCONTINUED] metoprolol (LOPRESSOR) 50 MG tablet Take 75 mg by mouth 2 (two) times daily.  . [DISCONTINUED] omeprazole (PRILOSEC) 20 MG capsule Take 20 mg by mouth daily.  . [DISCONTINUED] ranitidine (ZANTAC) 150 MG tablet Take 150 mg by mouth 2 (two) times daily.  . [DISCONTINUED] sitaGLIPtin-metformin (JANUMET) 50-500 MG tablet Take 1 tablet by mouth 2 (two) times daily.  . meloxicam (MOBIC) 15 MG tablet Take 1 tablet (15 mg total) by mouth daily. (Patient not taking: Reported on 05/21/2017)  . oxyCODONE (ROXICODONE) 5 MG immediate release tablet Take 1 tablet (5 mg total) by mouth every 6 (six) hours as needed for moderate pain. Do not drive while taking this medication. (Patient not taking: Reported on 05/21/2017)   No facility-administered encounter medications on file as of 05/21/2017.     Surgical History: Past Surgical History:  Procedure Laterality Date  . CLAVICLE SURGERY Right   . SHOULDER SURGERY Right     Medical History: Past Medical History:  Diagnosis Date  . Anxiety   . Depression   . Diabetes mellitus without complication ([place])   . GERD (gastroesophageal reflux disease)   . Headache   . Hyperlipidemia   . Hypertension   . Irregular heart beat unk  . Myocardial infarction ([place])   . PTSD (post-traumatic stress disorder)   . Tachycardia     Family History: Family History  Problem Relation Age of Onset  . Hypertension Mother   . Diabetes type II Mother   . COPD Father   . Cancer Father   . Heart disease Father     Social History   Socioeconomic History  . Marital status: Married    Spouse name: Not on file  . Number of children: Not on file  . Years of education: Not on file  . Highest education  level: Not on file  Social Needs  . Financial resource strain: Not on file  . Food insecurity - worry: Not on file  . Food insecurity - inability: Not on file  . Transportation needs - medical: Not on file  . Transportation needs - non-medical: Not on file  Occupational History  . Not on file  Tobacco Use  . Smoking status: Current Every Day Smoker    Packs/day: 1.00  . Smokeless tobacco: Former Network engineer and Sexual Activity  . Alcohol use: No    Alcohol/week: 0.0 oz    Frequency: Never  . Drug use: No  . Sexual activity: Not on file  Other Topics Concern  . Not on file  Social History Narrative  . Not on file      Review of Systems  Constitutional: Negative for activity change, appetite change and unexpected weight change.  HENT: Negative for postnasal drip, sore throat and voice change.   Eyes: Negative.   Respiratory: Positive for cough. Negative for wheezing.   Cardiovascular: Negative for chest pain,  palpitations and leg swelling.  Gastrointestinal: Positive for abdominal pain, dysphagia and heartburn. Negative for diarrhea, nausea and vomiting.       Increased GERD symptoms.   Endocrine: Positive for polydipsia and polyuria.       Patient does not check blood sugars to often. Knows they have been elevated. Not taking the medication he should as prescribed .  Genitourinary: Negative.   Skin: Negative for color change, pallor, rash and wound.  Allergic/Immunologic: Positive for environmental allergies.  Neurological: Positive for dizziness and weakness.  Psychiatric/Behavioral: Negative for behavioral problems and suicidal ideas. The patient is nervous/anxious.     Today's Vitals   05/21/17 1535  BP: (!) 134/95  Pulse: (!) 119  Resp: 16  SpO2: 94%  Weight: 250 lb 12.8 oz (113.8 kg)  Height: 5\' 9"  (1.753 m)    Physical Exam  Constitutional: He is oriented to person, place, and time. He appears well-developed and well-nourished. No distress.  HENT:   Head: Normocephalic and atraumatic.  Mouth/Throat: Oropharynx is clear and moist. No oropharyngeal exudate.  Eyes: EOM are normal. Pupils are equal, round, and reactive to light.  Neck: Normal range of motion. Neck supple. No JVD present. Carotid bruit is not present. No tracheal deviation present. No thyromegaly present.  Cardiovascular: Normal rate, regular rhythm, normal heart sounds and intact distal pulses. Exam reveals no gallop and no friction rub.  No murmur heard. Pulmonary/Chest: Effort normal and breath sounds normal. No respiratory distress. He has no wheezes. He has no rales. He exhibits no tenderness.  Abdominal: Soft. Bowel sounds are normal. There is no tenderness.  Musculoskeletal: Normal range of motion.  Lymphadenopathy:    He has no cervical adenopathy.  Neurological: He is alert and oriented to person, place, and time. No cranial nerve deficit.  Skin: Skin is warm and dry. He is not diaphoretic.  Psychiatric: He has a normal mood and affect. His behavior is normal. Judgment and thought content normal.  Nursing note and vitals reviewed.   Assessment/Plan:  1. Uncontrolled type 2 diabetes mellitus with hyperglycemia (HCC) - POCT HgB A1C is 12.0 today.  - glimepiride (AMARYL) 4 MG tablet; Take 1 tablet (4 mg total) by mouth daily with breakfast.  Dispense: 30 tablet; Refill: 3 - sitaGLIPtin-metformin (JANUMET) 50-500 MG tablet; Take 1 tablet by mouth 2 (two) times daily.  Dispense: 60 tablet; Refill: 3 Discussed importance of taking diabetic medications as prescribed. Check sugars every morning, prior to eating. The goal is to have fasting AM sugars consistently under 150.  2. Gastroesophageal reflux disease without esophagitis - omeprazole (PRILOSEC) 40 MG capsule; Take 1 capsule (40 mg total) by mouth daily.  Dispense: 30 capsule; Refill: 3 - ranitidine (ZANTAC) 150 MG tablet; Take 1 tablet (150 mg total) by mouth 2 (two) times daily.  Dispense: 60 tablet; Refill:  3  3. Essential hypertension - metoprolol tartrate (LOPRESSOR) 100 MG tablet; Take 1 tablet (100 mg total) by mouth 2 (two) times daily.  Dispense: 60 tablet; Refill: 3  General Counseling: Raymond [name] verbalizes understanding of the findings of todays visit and agrees with plan of treatment. I have discussed any further diagnostic evaluation that may be needed or ordered today. We also reviewed his medications today. he has been encouraged to call the office with any questions or concerns that should arise related to todays visit.  Diabetes Counseling:  1. Addition of ACE inh/ ARB'S for nephroprotection. 2. Diabetic foot care, prevention of complications.  3.Exercise and lose weight.  4.  Diabetic eye examination, 5. Monitor blood sugar closlely. nutrition counseling.  6.Sign and symptoms of hypoglycemia including shaking sweating,confusion and headaches.  This patient was seen by Leretha Pol, FNP- C in Collaboration with Dr Lavera Guise as a part of collaborative care agreement    Orders Placed This Encounter  Procedures  . POCT HgB A1C    Meds ordered this encounter  Medications  . metoprolol tartrate (LOPRESSOR) 100 MG tablet    Sig: Take 1 tablet (100 mg total) by mouth 2 (two) times daily.    Dispense:  60 tablet    Refill:  3    Increased dose    Order Specific Question:   Supervising Provider    Answer:   Lavera Guise [[id number]]  . omeprazole (PRILOSEC) 40 MG capsule    Sig: Take 1 capsule (40 mg total) by mouth daily.    Dispense:  30 capsule    Refill:  3    Order Specific Question:   Supervising Provider    Answer:   Lavera Guise [[id number]]  . ranitidine (ZANTAC) 150 MG tablet    Sig: Take 1 tablet (150 mg total) by mouth 2 (two) times daily.    Dispense:  60 tablet    Refill:  3    Order Specific Question:   Supervising Provider    Answer:   Lavera Guise [[id number]]  . glimepiride (AMARYL) 4 MG tablet    Sig: Take 1 tablet (4 mg total) by mouth daily with breakfast.     Dispense:  30 tablet    Refill:  3    Increased dose    Order Specific Question:   Supervising Provider    Answer:   Lavera Guise [place]  . sitaGLIPtin-metformin (JANUMET) 50-500 MG tablet    Sig: Take 1 tablet by mouth 2 (two) times daily.    Dispense:  60 tablet    Refill:  3    Samples rovided for 4 weeks    Order Specific Question:   Supervising Provider    Answer:   Lavera Guise [[id number]]    Time spent: 57 Minutes   Dr Lavera Guise Internal medicine

## 2017-05-23 LAB — POCT GLYCOSYLATED HEMOGLOBIN (HGB A1C): Hemoglobin A1C: 12

## 2017-05-30 ENCOUNTER — Encounter: Payer: Self-pay | Admitting: Emergency Medicine

## 2017-05-30 ENCOUNTER — Emergency Department: Payer: Medicare Other

## 2017-05-30 ENCOUNTER — Inpatient Hospital Stay
Admission: EM | Admit: 2017-05-30 | Discharge: 2017-06-01 | DRG: 872 | Disposition: A | Discharge status: Home / Self Care | Payer: Medicare Other | Attending: Internal Medicine | Admitting: Internal Medicine

## 2017-05-30 DIAGNOSIS — F411 Generalized anxiety disorder: Secondary | ICD-10-CM | POA: Diagnosis present

## 2017-05-30 DIAGNOSIS — K219 Gastro-esophageal reflux disease without esophagitis: Secondary | ICD-10-CM | POA: Diagnosis present

## 2017-05-30 DIAGNOSIS — Z888 Allergy status to other drugs, medicaments and biological substances status: Secondary | ICD-10-CM | POA: Diagnosis not present

## 2017-05-30 DIAGNOSIS — E1165 Type 2 diabetes mellitus with hyperglycemia: Secondary | ICD-10-CM | POA: Diagnosis present

## 2017-05-30 DIAGNOSIS — E872 Acidosis, unspecified: Secondary | ICD-10-CM

## 2017-05-30 DIAGNOSIS — R05 Cough: Secondary | ICD-10-CM | POA: Diagnosis not present

## 2017-05-30 DIAGNOSIS — R000 Tachycardia, unspecified: Secondary | ICD-10-CM | POA: Diagnosis not present

## 2017-05-30 DIAGNOSIS — I252 Old myocardial infarction: Secondary | ICD-10-CM

## 2017-05-30 DIAGNOSIS — J101 Influenza due to other identified influenza virus with other respiratory manifestations: Secondary | ICD-10-CM | POA: Diagnosis present

## 2017-05-30 DIAGNOSIS — Z833 Family history of diabetes mellitus: Secondary | ICD-10-CM | POA: Diagnosis not present

## 2017-05-30 DIAGNOSIS — Z8249 Family history of ischemic heart disease and other diseases of the circulatory system: Secondary | ICD-10-CM | POA: Diagnosis not present

## 2017-05-30 DIAGNOSIS — Z7984 Long term (current) use of oral hypoglycemic drugs: Secondary | ICD-10-CM

## 2017-05-30 DIAGNOSIS — E878 Other disorders of electrolyte and fluid balance, not elsewhere classified: Secondary | ICD-10-CM | POA: Diagnosis present

## 2017-05-30 DIAGNOSIS — Z79899 Other long term (current) drug therapy: Secondary | ICD-10-CM

## 2017-05-30 DIAGNOSIS — F17210 Nicotine dependence, cigarettes, uncomplicated: Secondary | ICD-10-CM | POA: Diagnosis present

## 2017-05-30 DIAGNOSIS — R509 Fever, unspecified: Secondary | ICD-10-CM | POA: Diagnosis not present

## 2017-05-30 DIAGNOSIS — J209 Acute bronchitis, unspecified: Secondary | ICD-10-CM | POA: Diagnosis present

## 2017-05-30 DIAGNOSIS — E871 Hypo-osmolality and hyponatremia: Secondary | ICD-10-CM | POA: Diagnosis present

## 2017-05-30 DIAGNOSIS — A419 Sepsis, unspecified organism: Principal | ICD-10-CM | POA: Diagnosis present

## 2017-05-30 DIAGNOSIS — E119 Type 2 diabetes mellitus without complications: Secondary | ICD-10-CM | POA: Diagnosis not present

## 2017-05-30 DIAGNOSIS — E785 Hyperlipidemia, unspecified: Secondary | ICD-10-CM | POA: Diagnosis present

## 2017-05-30 DIAGNOSIS — E669 Obesity, unspecified: Secondary | ICD-10-CM | POA: Diagnosis not present

## 2017-05-30 DIAGNOSIS — J111 Influenza due to unidentified influenza virus with other respiratory manifestations: Secondary | ICD-10-CM | POA: Diagnosis not present

## 2017-05-30 DIAGNOSIS — Z91018 Allergy to other foods: Secondary | ICD-10-CM | POA: Diagnosis not present

## 2017-05-30 DIAGNOSIS — I10 Essential (primary) hypertension: Secondary | ICD-10-CM | POA: Diagnosis present

## 2017-05-30 DIAGNOSIS — R69 Illness, unspecified: Secondary | ICD-10-CM

## 2017-05-30 LAB — CBC WITH DIFFERENTIAL/PLATELET
BASOS ABS: 0 10*3/uL (ref 0–0.1)
Basophils Relative: 0 %
EOS PCT: 0 %
Eosinophils Absolute: 0 10*3/uL (ref 0–0.7)
HCT: 44.1 % (ref 40.0–52.0)
HEMOGLOBIN: 15.5 g/dL (ref 13.0–18.0)
LYMPHS ABS: 0.6 10*3/uL — AB (ref 1.0–3.6)
LYMPHS PCT: 8 %
MCH: 30.2 pg (ref 26.0–34.0)
MCHC: 35.1 g/dL (ref 32.0–36.0)
MCV: 86 fL (ref 80.0–100.0)
Monocytes Absolute: 0.6 10*3/uL (ref 0.2–1.0)
Monocytes Relative: 7 %
NEUTROS ABS: 6.4 10*3/uL (ref 1.4–6.5)
NEUTROS PCT: 85 %
PLATELETS: 181 10*3/uL (ref 150–440)
RBC: 5.13 MIL/uL (ref 4.40–5.90)
RDW: 13.8 % (ref 11.5–14.5)
WBC: 7.6 10*3/uL (ref 3.8–10.6)

## 2017-05-30 LAB — INFLUENZA PANEL BY PCR (TYPE A & B)
INFLAPCR: POSITIVE — AB
INFLBPCR: NEGATIVE

## 2017-05-30 LAB — COMPREHENSIVE METABOLIC PANEL
ALBUMIN: 3.9 g/dL (ref 3.5–5.0)
ALT: 40 U/L (ref 17–63)
ANION GAP: 16 — AB (ref 5–15)
AST: 77 U/L — ABNORMAL HIGH (ref 15–41)
Alkaline Phosphatase: 116 U/L (ref 38–126)
BUN: 15 mg/dL (ref 6–20)
CALCIUM: 8.9 mg/dL (ref 8.9–10.3)
CHLORIDE: 98 mmol/L — AB (ref 101–111)
CO2: 16 mmol/L — AB (ref 22–32)
Creatinine, Ser: 1.18 mg/dL (ref 0.61–1.24)
GFR calc Af Amer: >60 mL/min (ref >60)
GFR calc non Af Amer: >60 mL/min (ref >60)
Glucose, Bld: 337 mg/dL — ABNORMAL HIGH (ref 65–99)
POTASSIUM: 3.6 mmol/L (ref 3.5–5.1)
SODIUM: 130 mmol/L — AB (ref 135–145)
Total Bilirubin: 0.5 mg/dL (ref 0.3–1.2)
Total Protein: 7.9 g/dL (ref 6.5–8.1)

## 2017-05-30 LAB — GLUCOSE, CAPILLARY: Glucose-Capillary: 292 mg/dL — ABNORMAL HIGH (ref 65–99)

## 2017-05-30 LAB — URINALYSIS, ROUTINE W REFLEX MICROSCOPIC
Bacteria, UA: NONE SEEN
Bilirubin Urine: NEGATIVE
KETONES UR: NEGATIVE mg/dL
Leukocytes, UA: NEGATIVE
Nitrite: NEGATIVE
PH: 5 (ref 5.0–8.0)
PROTEIN: 30 mg/dL — AB
Specific Gravity, Urine: 1.038 — ABNORMAL HIGH (ref 1.005–1.030)

## 2017-05-30 LAB — LACTIC ACID, PLASMA
LACTIC ACID, VENOUS: 5.7 mmol/L — AB (ref 0.5–1.9)
Lactic Acid, Venous: 4.3 mmol/L (ref 0.5–1.9)

## 2017-05-30 MED ORDER — ENOXAPARIN SODIUM 40 MG/0.4ML ~~LOC~~ SOLN
40.0000 mg | SUBCUTANEOUS | Status: DC
  Administered 2017-05-30 – 2017-05-31 (×2): 40 mg via SUBCUTANEOUS
  Filled 2017-05-30 (×2): qty 0.4

## 2017-05-30 MED ORDER — INSULIN ASPART 100 UNIT/ML ~~LOC~~ SOLN
0.0000 [IU] | Freq: Three times a day (TID) | SUBCUTANEOUS | Status: DC
  Administered 2017-05-31: 7 [IU] via SUBCUTANEOUS
  Administered 2017-05-31: 3 [IU] via SUBCUTANEOUS
  Administered 2017-05-31: 4 [IU] via SUBCUTANEOUS
  Administered 2017-06-01: 3 [IU] via SUBCUTANEOUS
  Filled 2017-05-30 (×4): qty 1

## 2017-05-30 MED ORDER — ALBUTEROL SULFATE (2.5 MG/3ML) 0.083% IN NEBU: 2.5000 mg | INHALATION_SOLUTION | RESPIRATORY_TRACT | Status: DC | PRN

## 2017-05-30 MED ORDER — PANTOPRAZOLE SODIUM 40 MG PO TBEC
40.0000 mg | DELAYED_RELEASE_TABLET | Freq: Every day | ORAL | Status: DC
  Administered 2017-05-30 – 2017-05-31 (×2): 40 mg via ORAL
  Filled 2017-05-30 (×3): qty 1

## 2017-05-30 MED ORDER — POLYETHYLENE GLYCOL 3350 17 G PO PACK: 17.0000 g | PACK | Freq: Every day | ORAL | Status: DC | PRN

## 2017-05-30 MED ORDER — GLIMEPIRIDE 4 MG PO TABS
4.0000 mg | ORAL_TABLET | Freq: Every day | ORAL | Status: DC
  Administered 2017-05-31 – 2017-06-01 (×2): 4 mg via ORAL
  Filled 2017-05-30 (×2): qty 1

## 2017-05-30 MED ORDER — SODIUM CHLORIDE 0.9 % IV BOLUS (SEPSIS)
500.0000 mL | Freq: Once | INTRAVENOUS | Status: AC
  Administered 2017-05-30: 500 mL via INTRAVENOUS

## 2017-05-30 MED ORDER — LORAZEPAM 2 MG/ML IJ SOLN: 1.0000 mg | Freq: Four times a day (QID) | INTRAMUSCULAR | Status: DC | PRN

## 2017-05-30 MED ORDER — METOPROLOL TARTRATE 5 MG/5ML IV SOLN: 5.0000 mg | Freq: Four times a day (QID) | INTRAVENOUS | Status: DC | PRN

## 2017-05-30 MED ORDER — DOCUSATE SODIUM 100 MG PO CAPS
100.0000 mg | ORAL_CAPSULE | Freq: Two times a day (BID) | ORAL | Status: DC
  Administered 2017-05-31 (×2): 100 mg via ORAL
  Filled 2017-05-30 (×3): qty 1

## 2017-05-30 MED ORDER — HYDROCHLOROTHIAZIDE 25 MG PO TABS
25.0000 mg | ORAL_TABLET | Freq: Every day | ORAL | Status: DC
  Administered 2017-06-01: 25 mg via ORAL
  Filled 2017-05-30 (×2): qty 1

## 2017-05-30 MED ORDER — ASPIRIN EC 81 MG PO TBEC
81.0000 mg | DELAYED_RELEASE_TABLET | Freq: Every day | ORAL | Status: DC
  Administered 2017-05-31 – 2017-06-01 (×2): 81 mg via ORAL
  Filled 2017-05-30 (×2): qty 1

## 2017-05-30 MED ORDER — ONDANSETRON HCL 4 MG PO TABS: 4.0000 mg | ORAL_TABLET | Freq: Four times a day (QID) | ORAL | Status: DC | PRN

## 2017-05-30 MED ORDER — ONDANSETRON HCL 4 MG/2ML IJ SOLN: 4.0000 mg | Freq: Four times a day (QID) | INTRAMUSCULAR | Status: DC | PRN

## 2017-05-30 MED ORDER — ATORVASTATIN CALCIUM 20 MG PO TABS
80.0000 mg | ORAL_TABLET | Freq: Every day | ORAL | Status: DC
  Administered 2017-05-31 – 2017-06-01 (×2): 80 mg via ORAL
  Filled 2017-05-30 (×2): qty 4

## 2017-05-30 MED ORDER — METOPROLOL TARTRATE 50 MG PO TABS
100.0000 mg | ORAL_TABLET | Freq: Two times a day (BID) | ORAL | Status: DC
  Administered 2017-05-30 – 2017-06-01 (×4): 100 mg via ORAL
  Filled 2017-05-30 (×4): qty 2

## 2017-05-30 MED ORDER — SODIUM CHLORIDE 0.9 % IV BOLUS (SEPSIS)
1000.0000 mL | Freq: Once | INTRAVENOUS | Status: AC
  Administered 2017-05-30: 1000 mL via INTRAVENOUS

## 2017-05-30 MED ORDER — QUETIAPINE FUMARATE ER 200 MG PO TB24
200.0000 mg | ORAL_TABLET | Freq: Every day | ORAL | Status: DC
  Administered 2017-05-30 – 2017-05-31 (×2): 200 mg via ORAL
  Filled 2017-05-30 (×2): qty 1

## 2017-05-30 MED ORDER — ACETAMINOPHEN 325 MG PO TABS
650.0000 mg | ORAL_TABLET | Freq: Four times a day (QID) | ORAL | Status: DC | PRN
  Administered 2017-05-30: 650 mg via ORAL
  Filled 2017-05-30: qty 2

## 2017-05-30 MED ORDER — IPRATROPIUM-ALBUTEROL 0.5-2.5 (3) MG/3ML IN SOLN
RESPIRATORY_TRACT | Status: AC
  Filled 2017-05-30: qty 3

## 2017-05-30 MED ORDER — HYDROXYZINE HCL 50 MG PO TABS
50.0000 mg | ORAL_TABLET | Freq: Two times a day (BID) | ORAL | Status: DC | PRN
  Filled 2017-05-30: qty 1

## 2017-05-30 MED ORDER — AMLODIPINE BESYLATE 5 MG PO TABS
5.0000 mg | ORAL_TABLET | Freq: Every day | ORAL | Status: DC
Start: 2017-05-31 — End: 2017-06-01
  Administered 2017-06-01: 5 mg via ORAL
  Filled 2017-05-30 (×2): qty 1

## 2017-05-30 MED ORDER — OSELTAMIVIR PHOSPHATE 75 MG PO CAPS
75.0000 mg | ORAL_CAPSULE | Freq: Once | ORAL | Status: AC
  Administered 2017-05-30: 75 mg via ORAL
  Filled 2017-05-30: qty 1

## 2017-05-30 MED ORDER — IPRATROPIUM-ALBUTEROL 0.5-2.5 (3) MG/3ML IN SOLN
3.0000 mL | Freq: Four times a day (QID) | RESPIRATORY_TRACT | Status: AC
  Administered 2017-05-30 – 2017-05-31 (×6): 3 mL via RESPIRATORY_TRACT
  Filled 2017-05-30 (×5): qty 3

## 2017-05-30 MED ORDER — INSULIN ASPART 100 UNIT/ML ~~LOC~~ SOLN
0.0000 [IU] | Freq: Every day | SUBCUTANEOUS | Status: DC
  Administered 2017-05-30 – 2017-05-31 (×2): 3 [IU] via SUBCUTANEOUS
  Filled 2017-05-30 (×2): qty 1

## 2017-05-30 MED ORDER — OSELTAMIVIR PHOSPHATE 75 MG PO CAPS
75.0000 mg | ORAL_CAPSULE | Freq: Two times a day (BID) | ORAL | Status: DC
  Administered 2017-05-30 – 2017-06-01 (×4): 75 mg via ORAL
  Filled 2017-05-30 (×4): qty 1

## 2017-05-30 MED ORDER — NITROGLYCERIN 0.4 MG SL SUBL: 0.4000 mg | SUBLINGUAL_TABLET | SUBLINGUAL | Status: DC | PRN

## 2017-05-30 MED ORDER — ACETAMINOPHEN 650 MG RE SUPP: 650.0000 mg | Freq: Four times a day (QID) | RECTAL | Status: DC | PRN

## 2017-05-30 MED ORDER — SODIUM CHLORIDE 0.9 % IV SOLN
INTRAVENOUS | Status: DC
  Administered 2017-05-30: 23:00:00 via INTRAVENOUS

## 2017-05-30 MED ORDER — OSELTAMIVIR PHOSPHATE 75 MG PO CAPS
ORAL_CAPSULE | ORAL | Status: AC
  Filled 2017-05-30: qty 1

## 2017-05-30 MED ORDER — MIRTAZAPINE 15 MG PO TABS
45.0000 mg | ORAL_TABLET | Freq: Every day | ORAL | Status: DC
  Administered 2017-05-30 – 2017-05-31 (×2): 45 mg via ORAL
  Filled 2017-05-30 (×2): qty 3

## 2017-05-30 MED ORDER — ALBUTEROL SULFATE (2.5 MG/3ML) 0.083% IN NEBU: 5.0000 mg | INHALATION_SOLUTION | Freq: Once | RESPIRATORY_TRACT | Status: DC

## 2017-05-30 MED ORDER — OXYCODONE HCL 5 MG PO TABS
5.0000 mg | ORAL_TABLET | ORAL | Status: DC | PRN
  Administered 2017-05-30 – 2017-06-01 (×4): 5 mg via ORAL
  Filled 2017-05-30 (×4): qty 1

## 2017-05-30 MED ORDER — BENZONATATE 100 MG PO CAPS
200.0000 mg | ORAL_CAPSULE | Freq: Three times a day (TID) | ORAL | Status: DC | PRN
  Administered 2017-05-30 – 2017-06-01 (×5): 200 mg via ORAL
  Filled 2017-05-30 (×5): qty 2

## 2017-05-30 MED ORDER — TRAZODONE HCL 100 MG PO TABS
300.0000 mg | ORAL_TABLET | Freq: Every day | ORAL | Status: DC
  Administered 2017-05-30 – 2017-05-31 (×2): 300 mg via ORAL
  Filled 2017-05-30 (×2): qty 3

## 2017-05-30 MED ORDER — FAMOTIDINE 20 MG PO TABS
10.0000 mg | ORAL_TABLET | Freq: Every day | ORAL | Status: DC
  Administered 2017-05-30 – 2017-05-31 (×2): 10 mg via ORAL
  Filled 2017-05-30 (×3): qty 1

## 2017-05-30 NOTE — ED Triage Notes (Signed)
Pt reprots flu like sx's since last pm. Pt reports non productive cough, fever and bodyaches.

## 2017-05-30 NOTE — ED Notes (Signed)
Date and time results received: 05/30/17 1725   Test: lactic  Critical Value: 4.3   Name of Provider Notified: Dr. Conni Slipper

## 2017-05-30 NOTE — ED Notes (Signed)
E-link nurse called this RN inquiring about antibiotics. EDP and Dr. Darvin Neighbours aware that patient does not have any antibiotics ordered. EDP, Dr. Conni Slipper and Dr. Darvin Neighbours notified and reports that the sepsis is due to the influenza. Dr. Darvin Neighbours states that he will put a note in the chart regarding conversation.

## 2017-05-30 NOTE — ED Notes (Signed)
Report to [place], [place]

## 2017-05-30 NOTE — ED Notes (Signed)
Floor states they can not take report at this time

## 2017-05-30 NOTE — ED Notes (Signed)
EDP aware that no antibiotics have been ordered.

## 2017-05-30 NOTE — ED Notes (Signed)
RT at bedside.

## 2017-05-30 NOTE — H&P (Signed)
[place] [facility] [place] NAME: Hawthorne Day    MR#:  [id number]  DATE OF BIRTH:  1966/02/20  DATE OF ADMISSION:  05/30/2017  PRIMARY CARE PHYSICIAN: [facility]   REQUESTING/REFERRING PHYSICIAN:   CHIEF COMPLAINT:   Chief Complaint  Patient presents with  . Cough  . Fever    HISTORY OF PRESENT ILLNESS: Raymond [name]  is a 52 y.o. male with a known history per below presenting to the emergency room with worsening shortness of breath, dyspnea on exertion, nonproductive cough, general malaise, fatigue, fevers, chills, night sweats, nausea, in the emergency room patient was found to have acute lactic acidosis, acute influenza A positivity, podiatry anemia, hypochloremia, hyperglycemia, chest x-ray noted for bronchitis changes which were mild, patient evaluated emergency room, noticed acute anxiety state, patient is now been admitted for acute influenza A, hyperglycemia with chronic diabetes type 2, acute lactic acidosis.  PAST MEDICAL HISTORY:   Past Medical History:  Diagnosis Date  . Anxiety   . Depression   . Diabetes mellitus without complication ([place])   . GERD (gastroesophageal reflux disease)   . Headache   . Hyperlipidemia   . Hypertension   . Irregular heart beat unk  . Myocardial infarction ([place])   . PTSD (post-traumatic stress disorder)   . Tachycardia     PAST SURGICAL HISTORY:  Past Surgical History:  Procedure Laterality Date  . CLAVICLE SURGERY Right   . SHOULDER SURGERY Right     SOCIAL HISTORY:  Social History   Tobacco Use  . Smoking status: Current Every Day Smoker    Packs/day: 1.00  . Smokeless tobacco: Former Network engineer Use Topics  . Alcohol use: No    Alcohol/week: 0.0 oz    Frequency: Never    FAMILY HISTORY:  Family History  Problem Relation Age of Onset  . Hypertension Mother   . Diabetes type II Mother   . COPD Father   . Cancer Father   . Heart disease Father      DRUG ALLERGIES:  Allergies  Allergen Reactions  . Onion Anaphylaxis  . Norco [Hydrocodone-Acetaminophen] Itching    REVIEW OF SYSTEMS:   CONSTITUTIONAL: No fever, +fatigue Arneta Cliche.  EYES: No blurred or double vision.  EARS, NOSE, AND THROAT: No tinnitus or ear pain.  RESPIRATORY: + cough, shortness of breath, wheezing, no or hemoptysis.  CARDIOVASCULAR: No chest pain, orthopnea, edema.  GASTROINTESTINAL: + nausea, no vomiting, diarrhea or abdominal pain.  GENITOURINARY: No dysuria, hematuria.  ENDOCRINE: No polyuria, nocturia,  HEMATOLOGY: No anemia, easy bruising or bleeding SKIN: No rash or lesion. MUSCULOSKELETAL: No joint pain or arthritis.   NEUROLOGIC: No tingling, numbness, weakness.  PSYCHIATRY: No anxiety or depression.   MEDICATIONS AT HOME:  Prior to Admission medications   Medication Sig Start Date End Date Taking? Authorizing Provider  ACCU-CHEK FASTCLIX LANCETS MISC yse as directed twice a day 05/07/17  Yes Boscia, Heather E, NP  amLODipine (NORVASC) 5 MG tablet Take 5 mg by mouth daily.   Yes [provider]  atorvastatin (LIPITOR) 80 MG tablet Take 80 mg by mouth daily. 03/19/17  Yes [provider]  glimepiride (AMARYL) 4 MG tablet Take 1 tablet (4 mg total) by mouth daily with breakfast. 05/21/17  Yes Boscia, Heather E, NP  hydrochlorothiazide (HYDRODIURIL) 25 MG tablet Take 25 mg by mouth daily.    Yes [provider]  hydrOXYzine (VISTARIL) 50 MG capsule Take 50 mg by mouth 2 (  two) times daily as needed.   Yes [provider]  metoprolol tartrate (LOPRESSOR) 100 MG tablet Take 1 tablet (100 mg total) by mouth 2 (two) times daily. 05/21/17  Yes Boscia, Greer Ee, NP  mirtazapine (REMERON) 45 MG tablet Take 45 mg by mouth at bedtime.   Yes [provider]  nitroGLYCERIN (NITROSTAT) 0.4 MG SL tablet Place 0.4 mg under the tongue every 5 (five) minutes as needed for chest pain.   Yes [provider]   omeprazole (PRILOSEC) 40 MG capsule Take 1 capsule (40 mg total) by mouth daily. 05/21/17  Yes Boscia, Greer Ee, NP  QUEtiapine (SEROQUEL XR) 200 MG 24 hr tablet Take 200 mg by mouth at bedtime.    Yes [provider]  ranitidine (ZANTAC) 150 MG tablet Take 1 tablet (150 mg total) by mouth 2 (two) times daily. 05/21/17  Yes Boscia, Greer Ee, NP  simvastatin (ZOCOR) 40 MG tablet Take 40 mg by mouth at bedtime.   Yes [provider]  sitaGLIPtin-metformin (JANUMET) 50-500 MG tablet Take 1 tablet by mouth 2 (two) times daily. 05/21/17  Yes Ronnell Freshwater, NP  traZODone (DESYREL) 100 MG tablet Take 300 mg by mouth at bedtime.   Yes [provider]      PHYSICAL EXAMINATION:   VITAL SIGNS: Blood pressure (!) 159/91, pulse (!) 127, temperature 99.9 F (37.7 C), temperature source Oral, resp. rate (!) 31, height 5\' 9"  (1.753 m), weight 113.4 kg (250 lb), SpO2 95 %.  GENERAL:  52 y.o.-year-old patient lying in the bed with no acute distress.  Morbid obesity EYES: Pupils equal, round, reactive to light and accommodation. No scleral icterus. Extraocular muscles intact.  HEENT: Head atraumatic, normocephalic. Oropharynx and nasopharynx clear.  NECK:  Supple, no jugular venous distention. No thyroid enlargement, no tenderness.  LUNGS: Normal breath sounds bilaterally, no wheezing, rales,rhonchi or crepitation. No use of accessory muscles of respiration.  CARDIOVASCULAR: S1, S2 normal. No murmurs, rubs, or gallops.  ABDOMEN: Soft, nontender, nondistended. Bowel sounds present. No organomegaly or mass.  EXTREMITIES: No pedal edema, cyanosis, or clubbing.  NEUROLOGIC: Cranial nerves II through XII are intact. Muscle strength 5/5 in all extremities. Sensation intact. Gait not checked.  PSYCHIATRIC: The patient is alert and oriented x 3.  Anxious SKIN: No obvious rash, lesion, or ulcer.   LABORATORY PANEL:   CBC Recent Labs  Lab 05/30/17 1437  WBC 7.6  HGB 15.5  HCT  44.1  PLT 181  MCV 86.0  MCH 30.2  MCHC 35.1  RDW 13.8  LYMPHSABS 0.6*  MONOABS 0.6  EOSABS 0.0  BASOSABS 0.0   ------------------------------------------------------------------------------------------------------------------  Chemistries  Recent Labs  Lab 05/30/17 1437  NA 130*  K 3.6  CL 98*  CO2 16*  GLUCOSE 337*  BUN 15  CREATININE 1.18  CALCIUM 8.9  AST 77*  ALT 40  ALKPHOS 116  BILITOT 0.5   ------------------------------------------------------------------------------------------------------------------ estimated creatinine clearance is 90.9 mL/min (by C-G formula based on SCr of 1.18 mg/dL). ------------------------------------------------------------------------------------------------------------------ No results for input(s): TSH, T4TOTAL, T3FREE, THYROIDAB in the last 72 hours.  Invalid input(s): FREET3   Coagulation profile No results for input(s): INR, PROTIME in the last 168 hours. ------------------------------------------------------------------------------------------------------------------- No results for input(s): DDIMER in the last 72 hours. -------------------------------------------------------------------------------------------------------------------  Cardiac Enzymes No results for input(s): CKMB, TROPONINI, MYOGLOBIN in the last 168 hours.  Invalid input(s): CK ------------------------------------------------------------------------------------------------------------------ Invalid input(s): POCBNP  ---------------------------------------------------------------------------------------------------------------  Urinalysis    Component Value Date/Time   COLORURINE YELLOW (A) 05/30/2017 1543  APPEARANCEUR CLEAR (A) 05/30/2017 1543   APPEARANCEUR Clear 11/28/2013 1506   LABSPEC 1.038 (H) 05/30/2017 1543   LABSPEC 1.017 11/28/2013 1506   PHURINE 5.0 05/30/2017 1543   GLUCOSEU >=500 (A) 05/30/2017 1543   GLUCOSEU 50 mg/dL  11/28/2013 1506   HGBUR SMALL (A) 05/30/2017 1543   BILIRUBINUR NEGATIVE 05/30/2017 1543   BILIRUBINUR Negative 11/28/2013 [place] 05/30/2017 1543   PROTEINUR 30 (A) 05/30/2017 1543   NITRITE NEGATIVE 05/30/2017 1543   LEUKOCYTESUR NEGATIVE 05/30/2017 1543   LEUKOCYTESUR Negative 11/28/2013 1506     RADIOLOGY: Dg Chest 2 View  Result Date: 05/30/2017 CLINICAL DATA:  Nonproductive cough, fever, and body aches. EXAM: CHEST  2 VIEW COMPARISON:  03/30/2015 and 11/02/2014 FINDINGS: The cardiomediastinal silhouette is unchanged and within normal limits. The lungs are hyperinflated with mild peribronchial thickening and slight interstitial coarsening. No lobar consolidation, edema, pleural effusion, pneumothorax is identified. An old right clavicle fracture transfixed with a metallic plate and screws is again noted. IMPRESSION: Mild bronchitic changes. Electronically Signed   By: Logan Bores M.D.   On: 05/30/2017 14:46    EKG: Orders placed or performed during the hospital encounter of 05/30/17  . ED EKG  . ED EKG    IMPRESSION AND PLAN: 1 acute sepsis secondary to acute influenza type a Noted tachycardia, tachypnea, acute lactic acidosis, lactic acid level greater than 4, fevers, chills, night sweats admit to regular nursing floor bed On our sepsis protocol, IV fluids for rehydration, Tamiflu twice daily, and continue close medical monitoring  2 acute influenza type a  Breathing treatments, supplemental oxygen as needed, respiratory therapy to see, and all other plans per above  3 acute hyperglycemia with chronic diabetes mellitus type 2 Exacerbated by above IV fluids for rehydration, sliding scale insulin with Accu-Cheks per routine, and continue home regiment  4 chronic GERD without esophagitis Stable  PPI daily  5 acute hyponatremia/hypochloremia IV fluids for rehydration and check BMP in the morning  6 acute lactic acidosis Secondary to acute  influenza/sepsis IV fluids for rehydration and check lactic acid level until normal  7 history of PTSD Noted anxiety state Continue home regiment, IV Ativan as needed anxiety  Full code Condition stable Disposition Home in 1-3 days barring any complications     All the records are reviewed and case discussed with ED provider. Management plans discussed with the patient, family and they are in agreement.  CODE STATUS: Code Status History    This patient does not have a recorded code status. Please follow your organizational policy for patients in this situation.       TOTAL TIME TAKING CARE OF THIS PATIENT: 45 minutes.    Avel Peace Tayvin Preslar M.D on 05/30/2017   Between 7am to 6pm - Pager - [phone]  After 6pm go to [email or web address] - password [email or web address] [facility]  Office  [phone]  CC: Primary care physician; [facility]   Note: This dictation was prepared with Dragon dictation along with smaller phrase technology. Any transcriptional errors that result from this process are unintentional. [place] at [facility]

## 2017-05-30 NOTE — ED Notes (Signed)
No bed assigned at this time, states Ready to Plan

## 2017-05-30 NOTE — ED Notes (Signed)
Date and time results received: 05/30/17 6:38 PM   Test: lactic  Critical Value: 5.7  Name of Provider Notified: Dr. Matt Holmes Orders Received? Or Actions Taken?: states he will place orders for more fluid

## 2017-05-30 NOTE — Progress Notes (Signed)
Patient admitted by Dr. Jerelyn Charles.  Reviewed chart.  Sepsis present on admission secondary to influenza.  Tamiflu started.  IV fluids per sepsis protocol.

## 2017-05-30 NOTE — ED Notes (Signed)
ED Provider at bedside. 

## 2017-05-30 NOTE — ED Notes (Signed)
Pt states he is able to take tylenol without rxn.

## 2017-05-30 NOTE — ED Provider Notes (Signed)
[facility] Emergency Department Provider Note   ____________________________________________   First MD Initiated Contact with Patient 05/30/17 1532     (approximate)  I have reviewed the triage vital signs and the nursing notes.   HISTORY  Chief Complaint Cough and Fever  HPI Raymond [name] is a 52 y.o. male Who reports he got sick last night aching all over with a dry cough. He had 102 fever last night. His been having cold chills and feeling hot. Came in today because he felt even sicker. Heart rate was 145 when he had his EKG done in triage. He is now 100 2733 laying in bed. He has a dry cough. He says he feels short of breath when he lays down.   Past Medical History:  Diagnosis Date  . Anxiety   . Depression   . Diabetes mellitus without complication ([place])   . GERD (gastroesophageal reflux disease)   . Headache   . Hyperlipidemia   . Hypertension   . Irregular heart beat unk  . Myocardial infarction ([place])   . PTSD (post-traumatic stress disorder)   . Tachycardia     Patient Active Problem List   Diagnosis Date Noted  . Essential hypertension 04/08/2015  . Depression 04/08/2015  . GERD (gastroesophageal reflux disease) 01/05/2015    Past Surgical History:  Procedure Laterality Date  . CLAVICLE SURGERY Right   . SHOULDER SURGERY Right     Prior to Admission medications   Medication Sig Start Date End Date Taking? Authorizing Provider  ACCU-CHEK FASTCLIX LANCETS MISC yse as directed twice a day 05/07/17  Yes Boscia, Heather E, NP  amLODipine (NORVASC) 5 MG tablet Take 5 mg by mouth daily.   Yes [provider]  atorvastatin (LIPITOR) 80 MG tablet Take 80 mg by mouth daily. 03/19/17  Yes [provider]  glimepiride (AMARYL) 4 MG tablet Take 1 tablet (4 mg total) by mouth daily with breakfast. 05/21/17  Yes Boscia, Heather E, NP  hydrochlorothiazide (HYDRODIURIL) 25 MG tablet Take 25 mg by mouth daily.    Yes  [provider]  hydrOXYzine (VISTARIL) 50 MG capsule Take 50 mg by mouth 2 (two) times daily as needed.   Yes [provider]  metoprolol tartrate (LOPRESSOR) 100 MG tablet Take 1 tablet (100 mg total) by mouth 2 (two) times daily. 05/21/17  Yes Boscia, Greer Ee, NP  mirtazapine (REMERON) 45 MG tablet Take 45 mg by mouth at bedtime.   Yes [provider]  nitroGLYCERIN (NITROSTAT) 0.4 MG SL tablet Place 0.4 mg under the tongue every 5 (five) minutes as needed for chest pain.   Yes [provider]  omeprazole (PRILOSEC) 40 MG capsule Take 1 capsule (40 mg total) by mouth daily. 05/21/17  Yes Boscia, Greer Ee, NP  QUEtiapine (SEROQUEL XR) 200 MG 24 hr tablet Take 200 mg by mouth at bedtime.    Yes [provider]  ranitidine (ZANTAC) 150 MG tablet Take 1 tablet (150 mg total) by mouth 2 (two) times daily. 05/21/17  Yes Boscia, Greer Ee, NP  simvastatin (ZOCOR) 40 MG tablet Take 40 mg by mouth at bedtime.   Yes [provider]  sitaGLIPtin-metformin (JANUMET) 50-500 MG tablet Take 1 tablet by mouth 2 (two) times daily. 05/21/17  Yes Ronnell Freshwater, NP  traZODone (DESYREL) 100 MG tablet Take 300 mg by mouth at bedtime.   Yes [provider]  meloxicam (MOBIC) 15 MG tablet Take 1 tablet (15 mg total)  by mouth daily. Patient not taking: Reported on 05/21/2017 01/10/16   Cuthriell, Charline Bills, PA-C  oxyCODONE (ROXICODONE) 5 MG immediate release tablet Take 1 tablet (5 mg total) by mouth every 6 (six) hours as needed for moderate pain. Do not drive while taking this medication. Patient not taking: Reported on 05/21/2017 11/02/14   Joanne Gavel, MD    Allergies Onion and Norco [hydrocodone-acetaminophen]  Family History  Problem Relation Age of Onset  . Hypertension Mother   . Diabetes type II Mother   . COPD Father   . Cancer Father   . Heart disease Father     Social History Social History   Tobacco Use  . Smoking status:  Current Every Day Smoker    Packs/day: 1.00  . Smokeless tobacco: Former Network engineer Use Topics  . Alcohol use: No    Alcohol/week: 0.0 oz    Frequency: Never  . Drug use: No    Review of Systems  Constitutional:  fever/chills Eyes: No visual changes. ENT: No sore throat. Cardiovascular: Denies chest pain. Respiratory: see history of present illness Gastrointestinal: No abdominal pain.  No nausea, no vomiting.  No diarrhea.  No constipation. Genitourinary: Negative for dysuria. Musculoskeletal: Negative for back pain. Skin: Negative for rash. Neurological: Negative for headaches, focal weakness   ____________________________________________   PHYSICAL EXAM:  VITAL SIGNS: ED Triage Vitals [05/30/17 1421]  Enc Vitals Group     BP (!) 158/84     Pulse Rate (!) 150     Resp 20     Temp 100 F (37.8 C)     Temp Source Oral     SpO2 95 %     Weight 250 lb (113.4 kg)     Height 5\' 9"  (1.753 m)     Head Circumference      Peak Flow      Pain Score 10     Pain Loc      Pain Edu?      Excl. in [place]?     Constitutional: Alert and oriented. Well appearing and in no acute distress. Eyes: Conjunctivae are normal. Head: Atraumatic. Nose: No congestion/rhinnorhea. Mouth/Throat: Mucous membranes are moist.  Oropharynx non-erythematous. Neck: No stridor.   Cardiovascular: rapid rate, regular rhythm. Grossly normal heart sounds.  Good peripheral circulation. Respiratory: Normal respiratory effort.  No retractions. Lungs CTAB. Gastrointestinal: Soft and nontender. No distention. No abdominal bruits. No CVA tenderness. Musculoskeletal: No lower extremity tenderness nor edema.  No joint effusions. Neurologic:  Normal speech and language. No gross focal neurologic deficits are appreciated. No gait instability. Skin:  Skin is warm, dry and intact. No rash noted. Psychiatric: Mood and affect are normal. Speech and behavior are  normal.  ____________________________________________   LABS (all labs ordered are listed, but only abnormal results are displayed)  Labs Reviewed  INFLUENZA PANEL BY PCR (TYPE A & B) - Abnormal; Notable for the following components:      Result Value   Influenza A By PCR POSITIVE (*)    All other components within normal limits  COMPREHENSIVE METABOLIC PANEL - Abnormal; Notable for the following components:   Sodium 130 (*)    Chloride 98 (*)    CO2 16 (*)    Glucose, Bld 337 (*)    AST 77 (*)    Anion gap 16 (*)    All other components within normal limits  LACTIC ACID, PLASMA - Abnormal; Notable for the following components:   Lactic Acid, Venous  4.3 (*)    All other components within normal limits  CBC WITH DIFFERENTIAL/PLATELET - Abnormal; Notable for the following components:   Lymphs Abs 0.6 (*)    All other components within normal limits  URINALYSIS, ROUTINE W REFLEX MICROSCOPIC - Abnormal; Notable for the following components:   Color, Urine YELLOW (*)    APPearance CLEAR (*)    Specific Gravity, Urine 1.038 (*)    Glucose, UA >=500 (*)    Hgb urine dipstick SMALL (*)    Protein, ur 30 (*)    Squamous Epithelial / LPF 0-5 (*)    All other components within normal limits  CULTURE, BLOOD (ROUTINE X 2)  CULTURE, BLOOD (ROUTINE X 2)  LACTIC ACID, PLASMA   ____________________________________________  EKG  EKG read and interpreted by me shows sinus tachycardia rate of 145 normal axis nonspecific ST-T changes likely rate related short PR on this EKG not on previous ones. ____________________________________________  RADIOLOGY  ED MD interpretation: radiology reports bronchitic changes no focal infiltrate I agree  Official radiology report(s): Dg Chest 2 View  Result Date: 05/30/2017 CLINICAL DATA:  Nonproductive cough, fever, and body aches. EXAM: CHEST  2 VIEW COMPARISON:  03/30/2015 and 11/02/2014 FINDINGS: The cardiomediastinal silhouette is unchanged and  within normal limits. The lungs are hyperinflated with mild peribronchial thickening and slight interstitial coarsening. No lobar consolidation, edema, pleural effusion, pneumothorax is identified. An old right clavicle fracture transfixed with a metallic plate and screws is again noted. IMPRESSION: Mild bronchitic changes. Electronically Signed   By: Logan Bores M.D.   On: 05/30/2017 14:46    ____________________________________________   PROCEDURES  Procedure(s) performed:   Procedures  Critical Care performed:   ____________________________________________   INITIAL IMPRESSION / ASSESSMENT AND PLAN / ED COURSE patient appears to have flu even though he meets sepsis criteria at this point I will not give him antibiotics because I do not believe they would do anything because resistance. If his flu test comes back negative or his white blood count comes back very elevated I will change my mind.      ____________________________________________   FINAL CLINICAL IMPRESSION(S) / ED DIAGNOSES  Final diagnoses:  Influenza-like illness  Tachycardia  Lactic acidosis     ED Discharge Orders    None       Note:  This document was prepared using Dragon voice recognition software and may include unintentional dictation errors.    Nena Polio, MD 05/30/17 1700

## 2017-05-30 NOTE — ED Notes (Signed)
Pt having strong nonproductive cough currently.

## 2017-05-31 ENCOUNTER — Other Ambulatory Visit: Payer: Self-pay

## 2017-05-31 LAB — BASIC METABOLIC PANEL
ANION GAP: 8 (ref 5–15)
BUN: 14 mg/dL (ref 6–20)
CO2: 20 mmol/L — ABNORMAL LOW (ref 22–32)
Calcium: 7.8 mg/dL — ABNORMAL LOW (ref 8.9–10.3)
Chloride: 106 mmol/L (ref 101–111)
Creatinine, Ser: 1 mg/dL (ref 0.61–1.24)
GFR calc Af Amer: >60 mL/min (ref >60)
GLUCOSE: 194 mg/dL — AB (ref 65–99)
POTASSIUM: 3.8 mmol/L (ref 3.5–5.1)
SODIUM: 134 mmol/L — AB (ref 135–145)

## 2017-05-31 LAB — LACTIC ACID, PLASMA
LACTIC ACID, VENOUS: 1.9 mmol/L (ref 0.5–1.9)
LACTIC ACID, VENOUS: 2.1 mmol/L — AB (ref 0.5–1.9)

## 2017-05-31 LAB — GLUCOSE, CAPILLARY
Glucose-Capillary: 226 mg/dL — ABNORMAL HIGH (ref 65–99)
Glucose-Capillary: 181 mg/dL — ABNORMAL HIGH (ref 65–99)
Glucose-Capillary: 126 mg/dL — ABNORMAL HIGH (ref 65–99)
Glucose-Capillary: 221 mg/dL — ABNORMAL HIGH (ref 65–99)

## 2017-05-31 LAB — CBC
HCT: 37.8 % — ABNORMAL LOW (ref 40.0–52.0)
Hemoglobin: 13.1 g/dL (ref 13.0–18.0)
MCH: 29.8 pg (ref 26.0–34.0)
MCHC: 34.7 g/dL (ref 32.0–36.0)
MCV: 85.9 fL (ref 80.0–100.0)
PLATELETS: 143 10*3/uL — AB (ref 150–440)
RBC: 4.4 MIL/uL (ref 4.40–5.90)
RDW: 14.2 % (ref 11.5–14.5)
WBC: 5.9 10*3/uL (ref 3.8–10.6)

## 2017-05-31 LAB — HEMOGLOBIN A1C
HEMOGLOBIN A1C: 11.9 % — AB (ref 4.8–5.6)
MEAN PLASMA GLUCOSE: 294.83 mg/dL

## 2017-05-31 MED ORDER — INSULIN GLARGINE 100 UNIT/ML ~~LOC~~ SOLN
23.0000 [IU] | Freq: Every day | SUBCUTANEOUS | Status: DC
  Administered 2017-05-31 – 2017-06-01 (×2): 23 [IU] via SUBCUTANEOUS
  Filled 2017-05-31 (×3): qty 0.23

## 2017-05-31 NOTE — Progress Notes (Signed)
Pt had lactic acid of 5.7 yest eve.. Requesting new lactic level. Pt AAOx4. Vss.

## 2017-05-31 NOTE — Progress Notes (Signed)
[place] [facility] [place] NAME: Raymond [name]    MR#:  [id number]  DATE OF BIRTH:  [birth year]  SUBJECTIVE: Seen at bedside, admitted for influenza A with sepsis and elevated lactic acid.  Patient still has some body pains and dry cough but better than yesterday.  No shortness of breath.  He feels comfortable today.  Lactic acid level is down.  No hypoxia.  No further fever.  Heart rate also improved from yesterday.  CHIEF COMPLAINT:   Chief Complaint  Patient presents with  . Cough  . Fever    REVIEW OF SYSTEMS:   ROS CONSTITUTIONAL: No fever, generalized body aches, fatigue. EYES: No blurred or double vision.  EARS, NOSE, AND THROAT: No tinnitus or ear pain.  RESPIRATORY: dry  cough.   DIOVASCULAR: No chest pain, orthopnea, edema.  GASTROINTESTINAL: No nausea, vomiting, diarrhea or abdominal pain.  GENITOURINARY: No dysuria, hematuria.  ENDOCRINE: No polyuria, nocturia,  HEMATOLOGY: No anemia, easy bruising or bleeding SKIN: No rash or lesion. MUSCULOSKELETAL: No joint pain or arthritis.   NEUROLOGIC: No tingling, numbness, weakness.  PSYCHIATRY: No anxiety or depression.   DRUG ALLERGIES:   Allergies  Allergen Reactions  . Onion Anaphylaxis  . Norco [Hydrocodone-Acetaminophen] Itching    VITALS:  Blood pressure 111/60, pulse 94, temperature 98.6 F (37 C), temperature source Oral, resp. rate (!) 22, height 5\' 9"  (1.753 m), weight 113.4 kg (250 lb), SpO2 94 %.  PHYSICAL EXAMINATION:  GENERAL:  52 y.o.-year-old patient lying in the bed with no acute distress.  EYES: Pupils equal, round, reactive to light and accommodation. No scleral icterus. Extraocular muscles intact.  HEENT: Head atraumatic, normocephalic. Oropharynx and nasopharynx clear.  NECK:  Supple, no jugular venous distention. No thyroid enlargement, no tenderness.  LUNGS: Normal breath sounds bilaterally, no wheezing, rales,rhonchi or crepitation. No use of  accessory muscles of respiration.  CARDIOVASCULAR: S1, S2 normal. No murmurs, rubs, or gallops.  ABDOMEN: Soft, nontender, nondistended. Bowel sounds present. No organomegaly or mass.  EXTREMITIES: No pedal edema, cyanosis, or clubbing.  NEUROLOGIC: Cranial nerves II through XII are intact. Muscle strength 5/5 in all extremities. Sensation intact. Gait not checked.  PSYCHIATRIC: The patient is alert and oriented x 3.  SKIN: No obvious rash, lesion, or ulcer.    LABORATORY PANEL:   CBC Recent Labs  Lab 05/31/17 0449  WBC 5.9  HGB 13.1  HCT 37.8*  PLT 143*   ------------------------------------------------------------------------------------------------------------------  Chemistries  Recent Labs  Lab 05/30/17 1437 05/31/17 0449  NA 130* 134*  K 3.6 3.8  CL 98* 106  CO2 16* 20*  GLUCOSE 337* 194*  BUN 15 14  CREATININE 1.18 1.00  CALCIUM 8.9 7.8*  AST 77*  --   ALT 40  --   ALKPHOS 116  --   BILITOT 0.5  --    ------------------------------------------------------------------------------------------------------------------  Cardiac Enzymes No results for input(s): TROPONINI in the last 168 hours. ------------------------------------------------------------------------------------------------------------------  RADIOLOGY:  Dg Chest 2 View  Result Date: 05/30/2017 CLINICAL DATA:  Nonproductive cough, fever, and body aches. EXAM: CHEST  2 VIEW COMPARISON:  03/30/2015 and 11/02/2014 FINDINGS: The cardiomediastinal silhouette is unchanged and within normal limits. The lungs are hyperinflated with mild peribronchial thickening and slight interstitial coarsening. No lobar consolidation, edema, pleural effusion, pneumothorax is identified. An old right clavicle fracture transfixed with a metallic plate and screws is again noted. IMPRESSION: Mild bronchitic changes. Electronically Signed   By: Logan Bores M.D.   On:  05/30/2017 14:46    EKG:   Orders placed or performed  during the hospital encounter of 05/30/17  . ED EKG  . ED EKG    ASSESSMENT AND PLAN:   52 year old male with shortness of breath, cough, body aches and found to have influenza type B and acute sepsis. 1.  Sepsis secondary to influenza type II: Receiving IV fluids, lactic acid number is down from 5.7-1.9..  And on Tamiflu.  Patient tachycardia, tachypnea, fevers are coming down. #2 diabetes mellitus type 2: Uncontrolled in the context of sepsis: Started on Lantus, continue glipizide, seen by diabetic nurse.  Continue sliding scale insulin also with coverage. 3.  GERD: Continue PPIs 4.  Hype hyponatremia, hypochloremia due to influenza: Improving with IV fluids.  likley d/c am  All the records are reviewed and case discussed with Care Management/Social Workerr. Management plans discussed with the patient, family and they are in agreement.  CODE STATUS: full  TOTAL TIME TAKING CARE OF THIS PATIENT: 31minutes.   POSSIBLE D/C IN 1-2 DAYS, DEPENDING ON CLINICAL CONDITION.   Epifanio Lesches M.D on 05/31/2017 at 10:47 AM  Between 7am to 6pm - Pager - [phone]  After 6pm go to [email or web address] - password [email or web address] [facility]  Office  [phone]  CC: Primary care physician; [place]   Note: This dictation was prepared with Dragon dictation along with smaller phrase technology. Any transcriptional errors that result from this process are unintentional.

## 2017-05-31 NOTE — Progress Notes (Signed)
pts lactic 1.9, 2.1. Notified md.

## 2017-05-31 NOTE — Progress Notes (Signed)
Inpatient Diabetes Program Recommendations  AACE/ADA: New Consensus Statement on Inpatient Glycemic Control (2015)  Target Ranges:  Prepandial:   less than 140 mg/dL      Peak postprandial:   less than 180 mg/dL (1-2 hours)      Critically ill patients:  140 - 180 mg/dL   Lab Results  Component Value Date   GLUCAP 292 (H) 05/30/2017   HGBA1C 12.0 05/23/2017    Review of Glycemic Control  Results for Raymond [name], Raymond [name] (MRN [id number]) as of 05/31/2017 08:06  Ref. Range 05/30/2017 22:51  Glucose-Capillary Latest Ref Range: 65 - 99 mg/dL 292 (H)    Diabetes history: Type 2 Outpatient Diabetes medications: Amaryl 4mg  qam, Janumet 50-500mg  bid  Current orders for Inpatient glycemic control: Amaryl 4mg  qam, novolog 0-20 units tid, novolog 0-5 units qhs  Inpatient Diabetes Program Recommendations: A1C 12.0%.  Please d/c Amaryl and start Lantus insulin 23 units qday (0.2units/kg)- Patient will likely need insulin vs. Oral agents at discharge.  Continue Novolog correction as ordered.   Gentry Fitz, RN, BA, MHA, CDE Diabetes Coordinator Inpatient Diabetes Program  [phone] (Team Pager) [phone] ([place]) 05/31/2017 8:12 AM

## 2017-06-01 LAB — HIV ANTIBODY (ROUTINE TESTING W REFLEX): HIV Screen 4th Generation wRfx: NONREACTIVE

## 2017-06-01 LAB — GLUCOSE, CAPILLARY: Glucose-Capillary: 129 mg/dL — ABNORMAL HIGH (ref 65–99)

## 2017-06-01 MED ORDER — INSULIN STARTER KIT- PEN NEEDLES (ENGLISH)
1.0000 | Freq: Once | Status: AC
  Administered 2017-06-01: 1
  Filled 2017-06-01: qty 1

## 2017-06-01 MED ORDER — BENZONATATE 200 MG PO CAPS: 200.0000 mg | ORAL_CAPSULE | Freq: Three times a day (TID) | ORAL | 0 refills | Status: DC | PRN

## 2017-06-01 MED ORDER — OSELTAMIVIR PHOSPHATE 75 MG PO CAPS: 75.0000 mg | ORAL_CAPSULE | Freq: Two times a day (BID) | ORAL | 0 refills | Status: DC

## 2017-06-01 MED ORDER — LIVING WELL WITH DIABETES BOOK
Freq: Once | Status: AC
  Administered 2017-06-01: 12:00:00
  Filled 2017-06-01: qty 1

## 2017-06-01 NOTE — Plan of Care (Signed)
  Education: Knowledge of General Education information will improve 06/01/2017 0434 - Progressing by Kayleann Mccaffery, Lucille Passy, RN   Health Behavior/Discharge Planning: Ability to manage health-related needs will improve 06/01/2017 0434 - Progressing by Kasee Hantz, Lucille Passy, RN   Clinical Measurements: Ability to maintain clinical measurements within normal limits will improve 06/01/2017 0434 - Progressing by Ahleah Simko, Lucille Passy, RN Will remain free from infection 06/01/2017 0434 - Progressing by Orvil Faraone, Lucille Passy, RN Diagnostic test results will improve 06/01/2017 0434 - Progressing by Dannisha Eckmann, Lucille Passy, RN Respiratory complications will improve 06/01/2017 0434 - Progressing by Bryna Colander, RN Cardiovascular complication will be avoided 06/01/2017 0434 - Progressing by Dorine Duffey, Lucille Passy, RN   Clinical Measurements: Will remain free from infection 06/01/2017 0434 - Progressing by Wynelle Cleveland Lucille Passy, RN   Clinical Measurements: Diagnostic test results will improve 06/01/2017 0434 - Progressing by Ausencio Vaden, Lucille Passy, RN   Activity: Risk for activity intolerance will decrease 06/01/2017 0434 - Progressing by Wynelle Cleveland Lucille Passy, RN   Nutrition: Adequate nutrition will be maintained 06/01/2017 0434 - Progressing by Vasilisa Vore, Lucille Passy, RN

## 2017-06-01 NOTE — Progress Notes (Signed)
Discharge note;  Discharge instructions and prescriptions given to pt. IV removed. Waiting on pharmacy to deliver insulin starter kit. Pt dressed awaiting discharge home.

## 2017-06-01 NOTE — Care Management Important Message (Signed)
Important Message  Patient Details  Name: Raymond [name] MRN: [id number] Date of Birth: [birth year]   Medicare Important Message Given:  Yes    Shelbie Ammons, RN 06/01/2017, 6:48 AM

## 2017-06-01 NOTE — Progress Notes (Addendum)
Inpatient Diabetes Program Recommendations  AACE/ADA: New Consensus Statement on Inpatient Glycemic Control (2015)  Target Ranges:  Prepandial:   less than 140 mg/dL      Peak postprandial:   less than 180 mg/dL (1-2 hours)      Critically ill patients:  140 - 180 mg/dL   Lab Results  Component Value Date   GLUCAP 129 (H) 06/01/2017   HGBA1C 11.9 (H) 05/31/2017    Review of Glycemic Control  Results for Raymond, [name] (MRN [id number]) as of 06/01/2017 09:21  Ref. Range 05/31/2017 09:08 05/31/2017 11:50 05/31/2017 17:05 05/31/2017 21:21 06/01/2017 07:38  Glucose-Capillary Latest Ref Range: 65 - 99 mg/dL 226 (H) 181 (H) 126 (H) 221 (H) 129 (H)   Diabetes history: Type 2 Outpatient Diabetes medications: Amaryl 786m qam, Janumet 50-508mbid  Current orders for Inpatient glycemic control: Amaryl 86m10mam, Novolog 0-20 units tid, Novolog 0-5 units qhs, Lantus 23 units qhs  Inpatient Diabetes Program Recommendations: A1C 12.0%.  Please d/c Amaryl but continue Lantus as ordered.   Please advise staff if patient will go home on insulin so insulin teaching can begin immediately.  I have ordered the Living Well with Diabetes book and the insulin pen teaching kit for the patient.    Raymond [name], BA, MHA, CDE Diabetes Coordinator Inpatient Diabetes Program  [phone] Pager) [phone]/11/2017 9:23 AM

## 2017-06-03 NOTE — Discharge Summary (Signed)
Raymond [name], is a 52 y.o. male  DOB 03/09/1966  MRN [id number].  Admission date:  05/30/2017  Admitting Physician  Gorden Harms, MD  Discharge Date:  06/01/2017   Primary MD  Llc, [facility]  Recommendations for primary care physician for things to follow:   Follow with PCP in 1 week   Admission Diagnosis  Lactic acidosis [E87.2] Tachycardia [R00.0] Influenza-like illness [R69]   Discharge Diagnosis  Lactic acidosis [E87.2] Tachycardia [R00.0] Influenza-like illness [R69]    Active Problems:   Influenza A      Past Medical History:  Diagnosis Date  . Anxiety   . Depression   . Diabetes mellitus without complication ([place])   . GERD (gastroesophageal reflux disease)   . Headache   . Hyperlipidemia   . Hypertension   . Irregular heart beat unk  . Myocardial infarction ([place])   . PTSD (post-traumatic stress disorder)   . Tachycardia     Past Surgical History:  Procedure Laterality Date  . CLAVICLE SURGERY Right   . SHOULDER SURGERY Right        History of present illness and  Hospital Course:     Kindly see H&P for history of present illness and admission details, please review complete Labs, Consult reports and Test reports for all details in brief  HPI  from the history and physical done on the day of admission 52 year old male with cough, fever, shortness of , acute bronchitis.,  Type influenza, also found to have hyperglycemia  Hospital Course  Sepsis secondary to: Received IV fluids, lactic acid improved from 5.7-1.9.  Received Tamiflu, patient tachycardia improved.  Discharged home with Tamiflu 75 mg p.o. twice daily for 4 more days. 2.  Diabetic control hemoglobin A1c 11.9, patient seen by diabetic nurse, continue Amaryl, Janumet 50/5 twice daily.  Patient to follow with  Pcp 3.  Essential hypertension, continue beta-blockers    Discharge Condition: Stable   Follow UP  Follow-up Information    Llc, [facility]. Schedule an appointment as soon as possible for a visit in 1 week(s).   Specialty:  Pulmonary Disease Contact information: [place] [address] [phone]             Discharge Instructions  and  Discharge Medications      Allergies as of 06/01/2017      Reactions   Onion Anaphylaxis   Norco [hydrocodone-acetaminophen] Itching      Medication List    STOP taking these medications   atorvastatin 80 MG tablet Commonly known as:  LIPITOR     TAKE these medications   ACCU-CHEK FASTCLIX LANCETS Misc yse as directed twice a day   amLODipine 5 MG tablet Commonly known as:  NORVASC Take 5 mg by mouth daily.   benzonatate 200 MG capsule Commonly known as:  TESSALON Take 1 capsule (200 mg total) by mouth 3 (three) times daily as needed for cough.   glimepiride 4 MG tablet Commonly known as:  AMARYL Take 1 tablet (4 mg total) by mouth daily with breakfast.   hydrochlorothiazide 25 MG tablet Commonly known as:  HYDRODIURIL Take 25 mg by mouth daily.   hydrOXYzine 50 MG capsule Commonly known as:  VISTARIL Take 50 mg by mouth 2 (two) times daily as needed.   metoprolol tartrate 100 MG tablet Commonly known as:  LOPRESSOR Take 1 tablet (100 mg total) by mouth 2 (two) times daily.   mirtazapine 45 MG tablet Commonly known  as:  REMERON Take 45 mg by mouth at bedtime.   nitroGLYCERIN 0.4 MG SL tablet Commonly known as:  NITROSTAT Place 0.4 mg under the tongue every 5 (five) minutes as needed for chest pain.   omeprazole 40 MG capsule Commonly known as:  PRILOSEC Take 1 capsule (40 mg total) by mouth daily.   oseltamivir 75 MG capsule Commonly known as:  TAMIFLU Take 1 capsule (75 mg total) by mouth 2 (two) times daily.   QUEtiapine 200 MG 24 hr tablet Commonly known as:  SEROQUEL  XR Take 200 mg by mouth at bedtime.   ranitidine 150 MG tablet Commonly known as:  ZANTAC Take 1 tablet (150 mg total) by mouth 2 (two) times daily.   simvastatin 40 MG tablet Commonly known as:  ZOCOR Take 40 mg by mouth at bedtime.   sitaGLIPtin-metformin 50-500 MG tablet Commonly known as:  JANUMET Take 1 tablet by mouth 2 (two) times daily.   traZODone 100 MG tablet Commonly known as:  DESYREL Take 300 mg by mouth at bedtime.         Diet and Activity recommendation: See Discharge Instructions above   Consults obtained -diabetic nurse   Major procedures and Radiology Reports - PLEASE review detailed and final reports for all details, in brief -  Diabetic nurse   Dg Chest 2 View  Result Date: 05/30/2017 CLINICAL DATA:  Nonproductive cough, fever, and body aches. EXAM: CHEST  2 VIEW COMPARISON:  03/30/2015 and 11/02/2014 FINDINGS: The cardiomediastinal silhouette is unchanged and within normal limits. The lungs are hyperinflated with mild peribronchial thickening and slight interstitial coarsening. No lobar consolidation, edema, pleural effusion, pneumothorax is identified. An old right clavicle fracture transfixed with a metallic plate and screws is again noted. IMPRESSION: Mild bronchitic changes. Electronically Signed   By: Logan Bores M.D.   On: 05/30/2017 14:46    Micro Results     Recent Results (from the past 240 hour(s))  Blood Culture (routine x 2)     Status: None (Preliminary result)   Collection Time: 05/30/17  3:43 PM  Result Value Ref Range Status   Specimen Description   Final    BLOOD Blood Culture results may not be optimal due to an inadequate volume of blood received in culture bottles   Special Requests RIGHT ANTECUBITAL  Final   Culture   Final    NO GROWTH 3 DAYS Performed at [facility], [address] [place], [place] [address]    Report Status PENDING  Incomplete  Blood Culture (routine x 2)     Status: None (Preliminary  result)   Collection Time: 05/30/17  3:43 PM  Result Value Ref Range Status   Specimen Description BLOOD Blood Culture adequate volume  Final   Special Requests BLOOD RIGHT HAND  Final   Culture   Final    NO GROWTH 3 DAYS Performed at [facility], [address], [place], [place] [address]    Report Status PENDING  Incomplete       Today   Subjective:   Berton Butrick today has no headache,no chest abdominal pain,no new weakness tingling or numbness, feels much better wants to go home today.   Objective:   Blood pressure (!) 141/82, pulse 79, temperature 98.4 F (36.9 C), temperature source Oral, resp. rate 18, height 5\' 9"  (1.753 m), weight 113.4 kg (250 lb), SpO2 95 %.  No intake or output data in the 24 hours ending 06/03/17 1512  Exam Awake  Alert, Oriented x 3, No new F.N deficits, Normal affect [place].AT,PERRAL Supple Neck,No JVD, No cervical lymphadenopathy appriciated.  Symmetrical Chest wall movement, Good air movement bilaterally, CTAB RRR,No Gallops,Rubs or new Murmurs, No Parasternal Heave +ve B.Sounds, Abd Soft, Non tender, No organomegaly appriciated, No rebound -guarding or rigidity. No Cyanosis, Clubbing or edema, No new Rash or bruise  Data Review   CBC w Diff:  Lab Results  Component Value Date   WBC 5.9 05/31/2017   HGB 13.1 05/31/2017   HGB 14.8 11/28/2013   HCT 37.8 (L) 05/31/2017   HCT 42.7 11/28/2013   PLT 143 (L) 05/31/2017   PLT 209 11/28/2013   LYMPHOPCT 8 05/30/2017   MONOPCT 7 05/30/2017   EOSPCT 0 05/30/2017   BASOPCT 0 05/30/2017    CMP:  Lab Results  Component Value Date   NA 134 (L) 05/31/2017   NA 138 07/15/2015   NA 138 11/28/2013   K 3.8 05/31/2017   K 3.7 11/28/2013   CL 106 05/31/2017   CL 105 11/28/2013   CO2 20 (L) 05/31/2017   CO2 26 11/28/2013   BUN 14 05/31/2017   BUN 12 07/15/2015   BUN 12 11/28/2013   CREATININE 1.00 05/31/2017   CREATININE 1.25 11/28/2013   GLU 97 01/05/2015   PROT 7.9  05/30/2017   PROT 7.3 07/15/2015   PROT 8.0 12/08/2012   ALBUMIN 3.9 05/30/2017   ALBUMIN 4.1 07/15/2015   ALBUMIN 4.1 12/08/2012   BILITOT 0.5 05/30/2017   BILITOT 0.2 07/15/2015   BILITOT 0.3 12/08/2012   ALKPHOS 116 05/30/2017   ALKPHOS 118 12/08/2012   AST 77 (H) 05/30/2017   AST 28 12/08/2012   ALT 40 05/30/2017   ALT 42 12/08/2012  .   Total Time in preparing paper work, data evaluation and todays exam - 35 minutes  Epifanio Lesches M.D on 06/01/2017 at 3:12 PM    Note: This dictation was prepared with Dragon dictation along with smaller phrase technology. Any transcriptional errors that result from this process are unintentional.

## 2017-06-04 LAB — CULTURE, BLOOD (ROUTINE X 2)
CULTURE: NO GROWTH
Culture: NO GROWTH
SPECIMEN DESCRIPTION: ADEQUATE

## 2017-06-04 LAB — BLOOD GAS, ARTERIAL
ACID-BASE DEFICIT: 8.1 mmol/L — AB (ref 0.0–2.0)
Bicarbonate: 16 mmol/L — ABNORMAL LOW (ref 20.0–28.0)
FIO2: 0.21
O2 SAT: 90.9 %
PATIENT TEMPERATURE: 37
PO2 ART: 64 mmHg — AB (ref 83.0–108.0)
pCO2 arterial: 29 mmHg — ABNORMAL LOW (ref 32.0–48.0)
pH, Arterial: 7.35 (ref 7.350–7.450)

## 2017-06-13 ENCOUNTER — Encounter: Payer: Self-pay | Admitting: Internal Medicine

## 2017-06-13 ENCOUNTER — Telehealth: Payer: Self-pay

## 2017-06-13 ENCOUNTER — Ambulatory Visit: Payer: Medicare Other | Admitting: Internal Medicine

## 2017-06-13 ENCOUNTER — Ambulatory Visit (INDEPENDENT_AMBULATORY_CARE_PROVIDER_SITE_OTHER): Payer: Medicare Other | Admitting: Internal Medicine

## 2017-06-13 VITALS — BP 123/88 | HR 98 | Resp 16 | Ht 69.0 in | Wt 248.0 lb

## 2017-06-13 DIAGNOSIS — R0602 Shortness of breath: Secondary | ICD-10-CM

## 2017-06-13 DIAGNOSIS — E1165 Type 2 diabetes mellitus with hyperglycemia: Secondary | ICD-10-CM

## 2017-06-13 DIAGNOSIS — I10 Essential (primary) hypertension: Secondary | ICD-10-CM

## 2017-06-13 NOTE — Telephone Encounter (Signed)
Showed patient how to use Basaglar insulin and gave samples and extra pen needles.  dbs

## 2017-06-13 NOTE — Progress Notes (Signed)
[facility] [place], [place] [address]  Internal MEDICINE  Office Visit Note  Patient Name: Raymond [name]  [id number]  [id number]  Date of Service: 07/01/2017  Chief Complaint  Patient presents with  . Diabetes  . Hospitalization Follow-up    for flu    Diabetes  He presents for his follow-up diabetic visit. He has type 2 diabetes mellitus. His disease course has been worsening. Hypoglycemia symptoms include nervousness/anxiousness. Associated symptoms include blurred vision, fatigue and weight loss. Pertinent negatives for diabetes include no chest pain. Diabetic complications include heart disease. There is no change (Pt was instructed to stop his metofrom and was started on Lantus) in his home blood glucose trend.  Other  Chronicity: Recent Hospital stay with flu. Associated symptoms include fatigue, myalgias and numbness. Pertinent negatives include no abdominal pain, chest pain, chills, congestion, nausea, sore throat or vomiting. The treatment provided significant relief.    Pt is here for routine follow up on multiple medical problems. He does have CAD, and PTSD. Pt compliance is poor    Current Medication: Outpatient Encounter Medications as of 06/13/2017  Medication Sig Note  . aspirin EC 81 MG tablet Take 81 mg by mouth.   . fluticasone (FLONASE) 50 MCG/ACT nasal spray 2 sprays by Each Nare route daily.   . [DISCONTINUED] atorvastatin (LIPITOR) 80 MG tablet Take 80 mg by mouth.   . [DISCONTINUED] metoprolol succinate (TOPROL-XL) 100 MG 24 hr tablet Take 150 mg by mouth.   Marland Kitchen ACCU-CHEK FASTCLIX LANCETS MISC yse as directed twice a day   . amLODipine (NORVASC) 5 MG tablet Take 5 mg by mouth daily.   . benzonatate (TESSALON) 200 MG capsule Take 1 capsule (200 mg total) by mouth 3 (three) times daily as needed for cough. (Patient not taking: Reported on 06/13/2017)   . hydrochlorothiazide (HYDRODIURIL) 25 MG tablet Take 25 mg by mouth daily.   05/30/2017: Patient reports taking but no recent fills per pharmacy records   . hydrOXYzine (VISTARIL) 50 MG capsule Take 50 mg by mouth 2 (two) times daily as needed.   . metoprolol tartrate (LOPRESSOR) 100 MG tablet Take 1 tablet (100 mg total) by mouth 2 (two) times daily.   . mirtazapine (REMERON) 45 MG tablet Take 45 mg by mouth at bedtime.   . nitroGLYCERIN (NITROSTAT) 0.4 MG SL tablet Place 0.4 mg under the tongue every 5 (five) minutes as needed for chest pain.   Marland Kitchen QUEtiapine (SEROQUEL XR) 200 MG 24 hr tablet Take 200 mg by mouth at bedtime.    . ranitidine (ZANTAC) 150 MG tablet Take 1 tablet (150 mg total) by mouth 2 (two) times daily.   . simvastatin (ZOCOR) 40 MG tablet Take 40 mg by mouth at bedtime.   . sitaGLIPtin-metformin (JANUMET) 50-500 MG tablet Take 1 tablet by mouth 2 (two) times daily.   . traZODone (DESYREL) 100 MG tablet Take 300 mg by mouth at bedtime.   . [DISCONTINUED] glimepiride (AMARYL) 4 MG tablet Take 1 tablet (4 mg total) by mouth daily with breakfast.   . [DISCONTINUED] hydrOXYzine (ATARAX/VISTARIL) 50 MG tablet    . [DISCONTINUED] omeprazole (PRILOSEC) 40 MG capsule Take 1 capsule (40 mg total) by mouth daily.   . [DISCONTINUED] oseltamivir (TAMIFLU) 75 MG capsule Take 1 capsule (75 mg total) by mouth 2 (two) times daily. (Patient not taking: Reported on 06/13/2017)    No facility-administered encounter medications on file as of 06/13/2017.     Surgical History: Past  Surgical History:  Procedure Laterality Date  . CLAVICLE SURGERY Right   . SHOULDER SURGERY Right     Medical History: Past Medical History:  Diagnosis Date  . Anxiety   . Depression   . Diabetes mellitus without complication ([place])   . GERD (gastroesophageal reflux disease)   . Headache   . Hyperlipidemia   . Hypertension   . Irregular heart beat unk  . Myocardial infarction ([place])   . PTSD (post-traumatic stress disorder)   . Tachycardia     Family History: Family History   Problem Relation Age of Onset  . Hypertension Mother   . Diabetes type II Mother   . COPD Father   . Cancer Father   . Heart disease Father     Social History   Socioeconomic History  . Marital status: Married    Spouse name: Not on file  . Number of children: Not on file  . Years of education: Not on file  . Highest education level: Not on file  Social Needs  . Financial resource strain: Not on file  . Food insecurity - worry: Not on file  . Food insecurity - inability: Not on file  . Transportation needs - medical: Not on file  . Transportation needs - non-medical: Not on file  Occupational History  . Not on file  Tobacco Use  . Smoking status: Current Every Day Smoker    Packs/day: 1.00  . Smokeless tobacco: Former Network engineer and Sexual Activity  . Alcohol use: No    Alcohol/week: 0.0 oz    Frequency: Never  . Drug use: No  . Sexual activity: Not on file  Other Topics Concern  . Not on file  Social History Narrative  . Not on file      Review of Systems  Constitutional: Positive for fatigue and weight loss. Negative for chills and unexpected weight change.  HENT: Positive for postnasal drip. Negative for congestion, rhinorrhea, sneezing and sore throat.   Eyes: Positive for blurred vision. Negative for redness.  Respiratory: Negative for shortness of breath.   Cardiovascular: Negative for chest pain and palpitations.  Gastrointestinal: Negative for abdominal pain, diarrhea, nausea and vomiting.  Musculoskeletal: Positive for myalgias.  Neurological: Positive for numbness.  Psychiatric/Behavioral: Positive for behavioral problems (Depression) and sleep disturbance. Negative for suicidal ideas. The patient is nervous/anxious.     Vital Signs: BP 123/88 (BP Location: Left Arm, Patient Position: Sitting)   Pulse 98   Resp 16   Ht 5\' 9"  (1.753 m)   Wt 248 lb (112.5 kg)   SpO2 95%   BMI 36.62 kg/m    Physical Exam  Constitutional: He appears  well-developed and well-nourished. No distress.  HENT:  Head: Normocephalic and atraumatic.  Right Ear: External ear normal.  Left Ear: External ear normal.  Nose: Nose normal.  Mouth/Throat: Oropharynx is clear and moist. No oropharyngeal exudate.  Eyes: Conjunctivae and EOM are normal. Pupils are equal, round, and reactive to light. Right eye exhibits no discharge.  Neck: Normal range of motion. Neck supple.  Cardiovascular: Normal rate, regular rhythm and normal heart sounds. Exam reveals no gallop and no friction rub.  No murmur heard. Pulmonary/Chest: Effort normal and breath sounds normal. He exhibits no tenderness.  Abdominal: Soft. Bowel sounds are normal.  Genitourinary: Vagina normal and uterus normal. No vaginal discharge found.  Musculoskeletal: Normal range of motion.  Lymphadenopathy:    He has no cervical adenopathy.  Neurological: He is alert. He has  normal reflexes. No cranial nerve deficit. He exhibits normal muscle tone.  Skin: Skin is warm and dry. He is not diaphoretic.  Psychiatric: He has a normal mood and affect. His behavior is normal. Judgment and thought content normal.    Assessment/Plan: 1. Uncontrolled type 2 diabetes mellitus with hyperglycemia (HCC) - Continue Lantus, pt needs to bring all meds so they can be updated  2. Essential hypertension - controlled   General Counseling: Kameran verbalizes understanding of the findings of todays visit and agrees with plan of treatment. I have discussed any further diagnostic evaluation that may be needed or ordered today. We also reviewed his medications today. he has been encouraged to call the office with any questions or concerns that should arise related to todays visit.  Diabetes Counseling:  1. Addition of ACE inh/ ARB'S for nephroprotection. 2. Diabetic foot care, prevention of complications.  3.Exercise and lose weight.  4. Diabetic eye examination, 5. Monitor blood sugar closlely. nutrition  counseling.  6.Sign and symptoms of hypoglycemia including shaking sweating,confusion and headaches.    Time spent: 25Minutes    Dr Lavera Guise Internal medicine

## 2017-06-15 ENCOUNTER — Other Ambulatory Visit: Payer: Self-pay | Admitting: Internal Medicine

## 2017-06-19 ENCOUNTER — Encounter: Payer: Self-pay | Admitting: Nurse Practitioner

## 2017-06-19 ENCOUNTER — Ambulatory Visit: Payer: Medicare Other | Admitting: Nurse Practitioner

## 2017-06-19 VITALS — BP 135/90 | HR 92 | Resp 16 | Ht 69.0 in | Wt 249.0 lb

## 2017-06-19 DIAGNOSIS — R0602 Shortness of breath: Secondary | ICD-10-CM

## 2017-06-19 DIAGNOSIS — F17210 Nicotine dependence, cigarettes, uncomplicated: Secondary | ICD-10-CM

## 2017-06-19 DIAGNOSIS — K219 Gastro-esophageal reflux disease without esophagitis: Secondary | ICD-10-CM | POA: Diagnosis not present

## 2017-06-19 DIAGNOSIS — E11649 Type 2 diabetes mellitus with hypoglycemia without coma: Secondary | ICD-10-CM | POA: Diagnosis not present

## 2017-06-19 DIAGNOSIS — I10 Essential (primary) hypertension: Secondary | ICD-10-CM

## 2017-06-19 MED ORDER — PANTOPRAZOLE SODIUM 40 MG PO TBEC: 40.0000 mg | DELAYED_RELEASE_TABLET | Freq: Every day | ORAL | 3 refills | Status: DC

## 2017-06-19 NOTE — Progress Notes (Signed)
[facility] [place], [place] [address]  Internal MEDICINE  Office Visit Note  Patient Name: Raymond [name]  [id number]  [id number]  Date of Service: 07/08/2017  Chief Complaint  Patient presents with  . Gastroesophageal Reflux    getting worse. waking him up in the middle of the night   . Shortness of Breath    Gastroesophageal Reflux  He complains of belching, chest pain, early satiety and globus sensation. He reports no abdominal pain, no coughing, no nausea or no sore throat. This is a recurrent problem. The current episode started 1 to 4 weeks ago. The problem occurs frequently. The problem has been gradually worsening. The symptoms are aggravated by certain foods and lying down. Associated symptoms include fatigue. Risk factors include caffeine use. He has tried an antacid and a PPI for the symptoms. The treatment provided mild relief. Past procedures include an EGD and a UGI.  Shortness of Breath  This is a recurrent problem. The current episode started 1 to 4 weeks ago. The problem occurs daily. The problem has been unchanged. Associated symptoms include chest pain, headaches, orthopnea and PND. Pertinent negatives include no abdominal pain, neck pain, rash, rhinorrhea, sore throat or vomiting. The symptoms are aggravated by exercise and emotional upset. Risk factors include smoking. He has tried rest for the symptoms. The treatment provided mild relief. His past medical history is significant for asthma.    Pt is here for routine follow up.    Current Medication: Outpatient Encounter Medications as of 06/19/2017  Medication Sig Note  . ACCU-CHEK FASTCLIX LANCETS MISC yse as directed twice a day   . aspirin EC 81 MG tablet Take 81 mg by mouth.   . fluticasone (FLONASE) 50 MCG/ACT nasal spray 2 sprays by Each Nare route daily. 07/06/2017: Ran out  and needs new prescription  . hydrOXYzine (VISTARIL) 50 MG capsule Take 50 mg by mouth 2 (two) times daily  as needed.   . metoprolol tartrate (LOPRESSOR) 100 MG tablet Take 1 tablet (100 mg total) by mouth 2 (two) times daily.   . mirtazapine (REMERON) 45 MG tablet Take 45 mg by mouth at bedtime.   . nitroGLYCERIN (NITROSTAT) 0.4 MG SL tablet Place 0.4 mg under the tongue every 5 (five) minutes as needed for chest pain.   Marland Kitchen QUEtiapine (SEROQUEL XR) 200 MG 24 hr tablet Take 200 mg by mouth at bedtime.    . ranitidine (ZANTAC) 150 MG tablet Take 1 tablet (150 mg total) by mouth 2 (two) times daily.   . sitaGLIPtin-metformin (JANUMET) 50-500 MG tablet Take 1 tablet by mouth 2 (two) times daily.   . traZODone (DESYREL) 100 MG tablet Take 300 mg by mouth at bedtime.   . [DISCONTINUED] omeprazole (PRILOSEC) 40 MG capsule Take 40 mg by mouth daily.   Marland Kitchen amLODipine (NORVASC) 5 MG tablet Take 5 mg by mouth daily.   Marland Kitchen atorvastatin (LIPITOR) 80 MG tablet TAKE 1 TABLET BY MOUTH ONCE DAILY (Patient not taking: Reported on 06/19/2017)   . benzonatate (TESSALON) 200 MG capsule Take 1 capsule (200 mg total) by mouth 3 (three) times daily as needed for cough. (Patient not taking: Reported on 06/13/2017)   . hydrochlorothiazide (HYDRODIURIL) 25 MG tablet Take 25 mg by mouth daily.  05/30/2017: Patient reports taking but no recent fills per pharmacy records   . pantoprazole (PROTONIX) 40 MG tablet Take 1 tablet (40 mg total) by mouth daily.   . simvastatin (ZOCOR) 40 MG tablet Take  40 mg by mouth at bedtime.   . [DISCONTINUED] hydrOXYzine (ATARAX/VISTARIL) 50 MG tablet    . [DISCONTINUED] metoprolol succinate (TOPROL-XL) 100 MG 24 hr tablet Take 150 mg by mouth.   . [DISCONTINUED] omeprazole (PRILOSEC) 40 MG capsule Take 1 capsule (40 mg total) by mouth daily.   . [DISCONTINUED] oseltamivir (TAMIFLU) 75 MG capsule Take 1 capsule (75 mg total) by mouth 2 (two) times daily. (Patient not taking: Reported on 06/13/2017)    No facility-administered encounter medications on file as of 06/19/2017.     Surgical History: Past  Surgical History:  Procedure Laterality Date  . CLAVICLE SURGERY Right   . SHOULDER SURGERY Right     Medical History: Past Medical History:  Diagnosis Date  . Anxiety   . Depression   . Diabetes mellitus without complication ([place])   . GERD (gastroesophageal reflux disease)   . Headache   . Hyperlipidemia   . Hypertension   . Irregular heart beat unk  . Myocardial infarction ([place])   . PTSD (post-traumatic stress disorder)   . Tachycardia     Family History: Family History  Problem Relation Age of Onset  . Hypertension Mother   . Diabetes type II Mother   . Diabetes Mother   . COPD Father   . Cancer Father   . Heart disease Father   . Diabetes Sister   . Diabetes Brother   . Diabetes Maternal Aunt   . Diabetes Sister   . Diabetes Sister   . Diabetes Sister     Social History   Socioeconomic History  . Marital status: Married    Spouse name: Not on file  . Number of children: Not on file  . Years of education: Not on file  . Highest education level: Not on file  Social Needs  . Financial resource strain: Not on file  . Food insecurity - worry: Not on file  . Food insecurity - inability: Not on file  . Transportation needs - medical: Not on file  . Transportation needs - non-medical: Not on file  Occupational History  . Not on file  Tobacco Use  . Smoking status: Current Every Day Smoker    Packs/day: 1.50    Years: 40.00    Pack years: 60.00    Types: Cigarettes  . Smokeless tobacco: Former Systems developer    Types: Chew  Substance and Sexual Activity  . Alcohol use: No    Alcohol/week: 0.0 oz    Frequency: Never  . Drug use: No  . Sexual activity: Not on file  Other Topics Concern  . Not on file  Social History Narrative  . Not on file      Review of Systems  Constitutional: Positive for fatigue. Negative for activity change, chills and unexpected weight change.  HENT: Negative for congestion, postnasal drip, rhinorrhea, sneezing and sore throat.    Eyes: Negative for redness.  Respiratory: Positive for shortness of breath. Negative for cough and chest tightness.   Cardiovascular: Positive for chest pain, orthopnea and PND. Negative for palpitations.  Gastrointestinal: Negative for abdominal pain, constipation, diarrhea, nausea and vomiting.       Worsening acid reflux despite using omeprazole.  Endocrine:       Improving blood sugars.   Genitourinary: Negative for dysuria and frequency.  Musculoskeletal: Positive for arthralgias. Negative for back pain, joint swelling and neck pain.  Skin: Negative for rash.  Allergic/Immunologic: Positive for environmental allergies.  Neurological: Positive for headaches. Negative for tremors and  numbness.  Hematological: Negative for adenopathy. Does not bruise/bleed easily.  Psychiatric/Behavioral: Positive for sleep disturbance. Negative for behavioral problems (Depression) and suicidal ideas. The patient is nervous/anxious.     Today's Vitals   06/19/17 1608  BP: 135/90  Pulse: 92  Resp: 16  SpO2: 94%  Weight: 249 lb (112.9 kg)  Height: 5\' 9"  (1.753 m)    Physical Exam  Constitutional: He is oriented to person, place, and time. He appears well-developed and well-nourished. No distress.  HENT:  Head: Normocephalic and atraumatic.  Mouth/Throat: Oropharynx is clear and moist. No oropharyngeal exudate.  Eyes: EOM are normal. Pupils are equal, round, and reactive to light.  Neck: Normal range of motion. Neck supple. No JVD present. Carotid bruit is not present. No tracheal deviation present. No thyromegaly present.  Cardiovascular: Normal rate, regular rhythm, normal heart sounds and intact distal pulses. Exam reveals no gallop and no friction rub.  No murmur heard. Pulmonary/Chest: Effort normal and breath sounds normal. No respiratory distress. He has no wheezes. He has no rales. He exhibits no tenderness.  Abdominal: Soft. Bowel sounds are normal. There is tenderness.  Slight  epigastric pain with light palpation of the abdomen.  Musculoskeletal: Normal range of motion.  Lymphadenopathy:    He has no cervical adenopathy.  Neurological: He is alert and oriented to person, place, and time. No cranial nerve deficit.  Skin: Skin is warm and dry. He is not diaphoretic.  Psychiatric: He has a normal mood and affect. His behavior is normal. Judgment and thought content normal.  Nursing note and vitals reviewed.  Assessment/Plan: 1. Uncontrolled type 2 diabetes mellitus with hypoglycemia without coma ([place]) Discussed new medication dosing and recommended dietary changes to help control blood sugars. Will refer to lifestyle center to help with diet and lifestyle changes. Written information also provided.  - Ambulatory referral to diabetic education  2. Gastroesophageal reflux disease without esophagitis Change omeprazole to pantoprazole - pantoprazole (PROTONIX) 40 MG tablet; Take 1 tablet (40 mg total) by mouth daily.  Dispense: 30 tablet; Refill: 3  3. Shortness of breath - ECHOCARDIOGRAM COMPLETE; Future  4. Essential hypertension Stable. Continue bp medications as prescribed.   5. Cigarette smoker Smoking cessation discussed with the patient.   General Counseling: Gladys verbalizes understanding of the findings of todays visit and agrees with plan of treatment. I have discussed any further diagnostic evaluation that may be needed or ordered today. We also reviewed his medications today. he has been encouraged to call the office with any questions or concerns that should arise related to todays visit.   This patient was seen by Leretha Pol, FNP- C in Collaboration with Dr Lavera Guise as a part of collaborative care agreement    Orders Placed This Encounter  Procedures  . Ambulatory referral to diabetic education  . ECHOCARDIOGRAM COMPLETE    Meds ordered this encounter  Medications  . pantoprazole (PROTONIX) 40 MG tablet    Sig: Take 1 tablet (40  mg total) by mouth daily.    Dispense:  30 tablet    Refill:  3    Order Specific Question:   Supervising Provider    Answer:   Lavera Guise [[id number]]    Time spent: 77 Minutes      Dr Lavera Guise Internal medicine

## 2017-06-28 ENCOUNTER — Encounter: Payer: Self-pay | Admitting: Internal Medicine

## 2017-06-28 NOTE — Progress Notes (Signed)
Pulmonary function test interpretation  Forced vital capacity is normal the FEV1 is normal.  FEV1 FVC ratio is normal.  Total lung capacity is increased residual volume is increased residual volume total lung capacity ratio is increased.  The DLCO is within normal limits.    Impression  This pulmonary function studies within normal limits.   There does not appear to be any change in the FEV1 post bronchodilator however this does not preclude the use of bronchodilators as clinical improvement may occur in the absence of spirometric improvement. DLCO is within normal limits Clinical correlation is recommended

## 2017-06-28 NOTE — Patient Instructions (Signed)

## 2017-07-02 ENCOUNTER — Ambulatory Visit: Payer: Medicare Other

## 2017-07-02 DIAGNOSIS — R0602 Shortness of breath: Secondary | ICD-10-CM

## 2017-07-06 ENCOUNTER — Encounter: Payer: Self-pay | Admitting: *Deleted

## 2017-07-06 ENCOUNTER — Encounter: Payer: Medicare Other | Attending: Nurse Practitioner | Admitting: *Deleted

## 2017-07-06 VITALS — BP 128/90 | Ht 69.0 in | Wt 248.8 lb

## 2017-07-06 DIAGNOSIS — E119 Type 2 diabetes mellitus without complications: Secondary | ICD-10-CM | POA: Diagnosis not present

## 2017-07-06 DIAGNOSIS — Z713 Dietary counseling and surveillance: Secondary | ICD-10-CM | POA: Insufficient documentation

## 2017-07-06 DIAGNOSIS — E1165 Type 2 diabetes mellitus with hyperglycemia: Secondary | ICD-10-CM

## 2017-07-06 NOTE — Progress Notes (Signed)
Diabetes Self-Management Education  Visit Type: First/Initial  Appt. Start Time: 1330 Appt. End Time: 5643  07/06/2017  [name], identified by name and date of birth, is a 52 y.o. male with a diagnosis of Diabetes: Type 2.   ASSESSMENT  Blood pressure 128/90, height 5\' 9"  (1.753 m), weight 248 lb 12.8 oz (112.9 kg). Body mass index is 36.74 kg/m.  Diabetes Self-Management Education - 07/06/17 1504      Visit Information   Visit Type  First/Initial      Initial Visit   Diabetes Type  Type 2    Are you currently following a meal plan?  No    Are you taking your medications as prescribed?  Yes    Date Diagnosed  6 months ago      Health Coping   How would you rate your overall health?  Poor      Psychosocial Assessment   Patient Belief/Attitude about Diabetes  Other (comment) "upset"    Self-care barriers  None    Self-management support  Doctor's office;Family    Patient Concerns  Nutrition/Meal planning;Glycemic Control;Weight Control;Problem Solving;Healthy Lifestyle;Monitoring;Medication    Special Needs  None    Preferred Learning Style  Auditory;Hands on    Learning Readiness  Contemplating    How often do you need to have someone help you when you read instructions, pamphlets, or other written materials from your doctor or pharmacy?  1 - Never    What is the last grade level you completed in school?  9th      Pre-Education Assessment   Patient understands the diabetes disease and treatment process.  Needs Instruction    Patient understands incorporating nutritional management into lifestyle.  Needs Instruction    Patient undertands incorporating physical activity into lifestyle.  Needs Instruction    Patient understands using medications safely.  Needs Instruction    Patient understands monitoring blood glucose, interpreting and using results  Needs Instruction    Patient understands prevention, detection, and treatment of acute complications.  Needs  Instruction    Patient understands prevention, detection, and treatment of chronic complications.  Needs Instruction    Patient understands how to develop strategies to address psychosocial issues.  Needs Instruction    Patient understands how to develop strategies to promote health/change behavior.  Needs Instruction      Complications   Last HgB A1C per patient/outside source  11.9 % 05/31/17    How often do you check your blood sugar?  1-2 times/day    Fasting Blood glucose range (mg/dL)  >200 Pt reports FBG's 220's mg/dL.     Have you had a dilated eye exam in the past 12 months?  No    Have you had a dental exam in the past 12 months?  No    Are you checking your feet?  No      Dietary Intake   Breakfast  Pt usually eats 1 meal/day - doesn't eat any fruit or non-starchy vegetables    Snack (morning)  hotdog with or without bun    Dinner  chicken, beef, pork, chicken wings, fish sandwich, soup, potatoes, bread, corn, peas, beans, pasta occasional rice    Beverage(s)  water, soft drinks, sports drinks      Exercise   Exercise Type  ADL's      Patient Education   Previous Diabetes Education  No    Disease state   Definition of diabetes, type 1 and 2, and the diagnosis of  diabetes    Nutrition management   Role of diet in the treatment of diabetes and the relationship between the three main macronutrients and blood glucose level;Reviewed blood glucose goals for pre and post meals and how to evaluate the patients' food intake on their blood glucose level.    Physical activity and exercise   Role of exercise on diabetes management, blood pressure control and cardiac health.    Medications  Taught/reviewed insulin injection, site rotation, insulin storage and needle disposal.;Reviewed patients medication for diabetes, action, purpose, timing of dose and side effects.    Monitoring  Purpose and frequency of SMBG.;Taught/discussed recording of test results and interpretation of  SMBG.;Identified appropriate SMBG and/or A1C goals.    Acute complications  Taught treatment of hypoglycemia - the 15 rule.    Chronic complications  Relationship between chronic complications and blood glucose control    Psychosocial adjustment  Identified and addressed patients feelings and concerns about diabetes    Personal strategies to promote health  Review risk of smoking and offered smoking cessation      Individualized Goals (developed by patient)   Reducing Risk  Improve blood sugars Decrease medications Prevent diabetes complications Lose weight Lead a healthier lifestyle Become more fit Quit smoking     Outcomes   Expected Outcomes  Demonstrated interest in learning. Expect positive outcomes    Future DMSE  2 wks       Individualized Plan for Diabetes Self-Management Training:   Learning Objective:  Patient will have a greater understanding of diabetes self-management. Patient education plan is to attend individual and/or group sessions per assessed needs and concerns.   Plan:   Patient Instructions  Check blood sugars 1-2 x day before breakfast and/or 2 hrs after supper every day Bring blood sugar records to the next class Exercise: Walk as tolerated (try to walk 10 minutes one hour after supper) Eat 3 meals day,  1-2 snacks a day Space meals 4-6 hours apart Don't skip meals Avoid sugar sweetened drinks (soda, sports drinks) Quit smoking Carry fast acting glucose and a snack at all times Rotate injection sites Hold insulin pen for 5-10 seconds after injection Keep insulin pen at room temperature after you start using it   Expected Outcomes:  Demonstrated interest in learning. Expect positive outcomes  Education material provided:  General Meal Planning Guidelines Simple Meal Plan Glucose tablets Symptoms, causes and treatments of Hypoglycemia  If problems or questions, patient to contact team via:  Raymond [name], [place], [place], CDE [phone]  Future  DSME appointment: 2 wks  July 23, 2017 for Diabetes Class 1

## 2017-07-06 NOTE — Patient Instructions (Addendum)
Check blood sugars 1-2 x day before breakfast and/or 2 hrs after supper every day Bring blood sugar records to the next class  Exercise: Walk as tolerated (try to walk 10 minutes one hour after supper)  Eat 3 meals day,  1-2 snacks a day Space meals 4-6 hours apart Don't skip meals Avoid sugar sweetened drinks (soda, sports drinks)  Quit smoking  Carry fast acting glucose and a snack at all times Rotate injection sites Hold insulin pen for 5-10 seconds after injection Keep insulin pen at room temperature after you start using it  Return for classes on:

## 2017-07-08 DIAGNOSIS — R0602 Shortness of breath: Secondary | ICD-10-CM | POA: Insufficient documentation

## 2017-07-08 DIAGNOSIS — F17210 Nicotine dependence, cigarettes, uncomplicated: Secondary | ICD-10-CM | POA: Insufficient documentation

## 2017-07-08 DIAGNOSIS — E11649 Type 2 diabetes mellitus with hypoglycemia without coma: Secondary | ICD-10-CM | POA: Insufficient documentation

## 2017-07-13 ENCOUNTER — Ambulatory Visit: Payer: Medicare Other | Admitting: Nurse Practitioner

## 2017-07-13 ENCOUNTER — Encounter: Payer: Self-pay | Admitting: Nurse Practitioner

## 2017-07-13 VITALS — BP 128/90 | HR 105 | Resp 16 | Ht 69.0 in | Wt 252.2 lb

## 2017-07-13 DIAGNOSIS — F339 Major depressive disorder, recurrent, unspecified: Secondary | ICD-10-CM | POA: Diagnosis not present

## 2017-07-13 DIAGNOSIS — K2100 Gastro-esophageal reflux disease with esophagitis, without bleeding: Secondary | ICD-10-CM

## 2017-07-13 DIAGNOSIS — F17210 Nicotine dependence, cigarettes, uncomplicated: Secondary | ICD-10-CM | POA: Diagnosis not present

## 2017-07-13 DIAGNOSIS — E11649 Type 2 diabetes mellitus with hypoglycemia without coma: Secondary | ICD-10-CM | POA: Diagnosis not present

## 2017-07-13 DIAGNOSIS — I10 Essential (primary) hypertension: Secondary | ICD-10-CM | POA: Diagnosis not present

## 2017-07-13 MED ORDER — BASAGLAR KWIKPEN 100 UNIT/ML ~~LOC~~ SOPN: PEN_INJECTOR | SUBCUTANEOUS | 5 refills | Status: DC

## 2017-07-13 NOTE — Progress Notes (Signed)
[facility] [place], [place] [address]  Internal MEDICINE  Office Visit Note  Patient Name: Raymond [name]  [id number]  [id number]  Date of Service: 08/05/2017  Chief Complaint  Patient presents with  . Hypertension  . Diabetes    He presents for his follow-up diabetic visit. He has type 2 diabetes mellitus. His disease course has been worsening. Hypoglycemia symptoms include nervousness/anxiousness. Associated symptoms include blurred vision, fatigue and weight loss. Pertinent negatives for diabetes include no chest pain. Diabetic complications include heart disease. There is no change He has gradually increased his basal insulin dosing up to 22 units per day. Blood sugars continue to be slightly elevated, though they do consistently improve.    Pt is here for routine follow up.    Current Medication: Outpatient Encounter Medications as of 07/13/2017  Medication Sig Note  . ACCU-CHEK FASTCLIX LANCETS MISC yse as directed twice a day   . amLODipine (NORVASC) 5 MG tablet Take 5 mg by mouth daily.   Marland Kitchen aspirin EC 81 MG tablet Take 81 mg by mouth.   Marland Kitchen atorvastatin (LIPITOR) 80 MG tablet TAKE 1 TABLET BY MOUTH ONCE DAILY (Patient not taking: Reported on 06/19/2017)   . benzonatate (TESSALON) 200 MG capsule Take 1 capsule (200 mg total) by mouth 3 (three) times daily as needed for cough. (Patient not taking: Reported on 06/13/2017)   . fluticasone (FLONASE) 50 MCG/ACT nasal spray 2 sprays by Each Nare route daily. 07/06/2017: Ran out  and needs new prescription  . hydrochlorothiazide (HYDRODIURIL) 25 MG tablet Take 25 mg by mouth daily.  05/30/2017: Patient reports taking but no recent fills per pharmacy records   . hydrOXYzine (VISTARIL) 50 MG capsule Take 50 mg by mouth 2 (two) times daily as needed.   . Insulin Glargine (BASAGLAR KWIKPEN) 100 UNIT/ML SOPN Gradually titrate to 30 units daily   . metoprolol tartrate (LOPRESSOR) 100 MG tablet Take 1 tablet (100 mg  total) by mouth 2 (two) times daily.   . mirtazapine (REMERON) 45 MG tablet Take 45 mg by mouth at bedtime.   . nitroGLYCERIN (NITROSTAT) 0.4 MG SL tablet Place 0.4 mg under the tongue every 5 (five) minutes as needed for chest pain.   . pantoprazole (PROTONIX) 40 MG tablet Take 1 tablet (40 mg total) by mouth daily.   . QUEtiapine (SEROQUEL XR) 200 MG 24 hr tablet Take 200 mg by mouth at bedtime.    . ranitidine (ZANTAC) 150 MG tablet Take 1 tablet (150 mg total) by mouth 2 (two) times daily.   . simvastatin (ZOCOR) 40 MG tablet Take 40 mg by mouth at bedtime.   . sitaGLIPtin-metformin (JANUMET) 50-500 MG tablet Take 1 tablet by mouth 2 (two) times daily.   . traZODone (DESYREL) 100 MG tablet Take 300 mg by mouth at bedtime.   . [DISCONTINUED] Insulin Glargine (BASAGLAR KWIKPEN [place]) Inject 22 Units into the skin daily.    No facility-administered encounter medications on file as of 07/13/2017.     Surgical History: Past Surgical History:  Procedure Laterality Date  . CLAVICLE SURGERY Right   . SHOULDER SURGERY Right     Medical History: Past Medical History:  Diagnosis Date  . Anxiety   . Depression   . Diabetes mellitus without complication ([place])   . GERD (gastroesophageal reflux disease)   . Headache   . Hyperlipidemia   . Hypertension   . Irregular heart beat unk  . Myocardial infarction ([place])   . PTSD (  post-traumatic stress disorder)   . Tachycardia     Family History: Family History  Problem Relation Age of Onset  . Hypertension Mother   . Diabetes type II Mother   . Diabetes Mother   . COPD Father   . Cancer Father   . Heart disease Father   . Diabetes Sister   . Diabetes Brother   . Diabetes Maternal Aunt   . Diabetes Sister   . Diabetes Sister   . Diabetes Sister     Social History   Socioeconomic History  . Marital status: Married    Spouse name: Not on file  . Number of children: Not on file  . Years of education: Not on file  . Highest education  level: Not on file  Occupational History  . Not on file  Social Needs  . Financial resource strain: Not on file  . Food insecurity:    Worry: Not on file    Inability: Not on file  . Transportation needs:    Medical: Not on file    Non-medical: Not on file  Tobacco Use  . Smoking status: Current Every Day Smoker    Packs/day: 1.50    Years: 40.00    Pack years: 60.00    Types: Cigarettes  . Smokeless tobacco: Former Systems developer    Types: Chew  Substance and Sexual Activity  . Alcohol use: No    Alcohol/week: 0.0 oz    Frequency: Never  . Drug use: No  . Sexual activity: Not on file  Lifestyle  . Physical activity:    Days per week: Not on file    Minutes per session: Not on file  . Stress: Not on file  Relationships  . Social connections:    Talks on phone: Not on file    Gets together: Not on file    Attends religious service: Not on file    Active member of club or organization: Not on file    Attends meetings of clubs or organizations: Not on file    Relationship status: Not on file  . Intimate partner violence:    Fear of current or ex partner: Not on file    Emotionally abused: Not on file    Physically abused: Not on file    Forced sexual activity: Not on file  Other Topics Concern  . Not on file  Social History Narrative  . Not on file      Review of Systems  Constitutional: Positive for fatigue. Negative for activity change, appetite change and unexpected weight change.  HENT: Negative for congestion, postnasal drip, sore throat and voice change.   Eyes: Negative.   Respiratory: Positive for cough. Negative for wheezing.   Cardiovascular: Negative for chest pain, palpitations and leg swelling.       Blood pressure elevated today.   Gastrointestinal: Positive for abdominal pain. Negative for diarrhea, nausea and vomiting.       Increased GERD symptoms.   Endocrine: Positive for polydipsia and polyuria.       Patient does not check blood sugars to often.  Knows they have been elevated. Not taking the medication he should as prescribed .  Genitourinary: Negative.   Skin: Negative for color change, pallor, rash and wound.  Allergic/Immunologic: Positive for environmental allergies.  Neurological: Positive for dizziness and weakness.  Hematological: Negative for adenopathy.  Psychiatric/Behavioral: Negative for behavioral problems and suicidal ideas. The patient is nervous/anxious.     Today's Vitals   07/13/17 0857  07/13/17 1017  BP: (!) 160/105 128/90  Pulse: (!) 105   Resp: 16   SpO2: 92%   Weight: 252 lb 3.2 oz (114.4 kg)   Height: 5\' 9"  (1.753 m)     Physical Exam  Constitutional: He is oriented to person, place, and time. He appears well-developed and well-nourished. No distress.  HENT:  Head: Normocephalic and atraumatic.  Mouth/Throat: Oropharynx is clear and moist. No oropharyngeal exudate.  Eyes: Pupils are equal, round, and reactive to light. EOM are normal.  Neck: Normal range of motion. Neck supple. No JVD present. Carotid bruit is not present. No tracheal deviation present. No thyromegaly present.  Cardiovascular: Normal rate, regular rhythm, normal heart sounds and intact distal pulses. Exam reveals no gallop and no friction rub.  No murmur heard. Pulmonary/Chest: Effort normal and breath sounds normal. No respiratory distress. He has no wheezes. He has no rales. He exhibits no tenderness.  Abdominal: Soft. Bowel sounds are normal. There is no tenderness.  Musculoskeletal: Normal range of motion.  Lymphadenopathy:    He has no cervical adenopathy.  Neurological: He is alert and oriented to person, place, and time. No cranial nerve deficit.  Skin: Skin is warm and dry. He is not diaphoretic.  Psychiatric: He has a normal mood and affect. His behavior is normal. Judgment and thought content normal.  Nursing note and vitals reviewed.   Assessment/Plan:  1. Uncontrolled type 2 diabetes mellitus with hypoglycemia  without coma ([place]) Continue to gradually increase basaglar up tto 30 units daily. Goal is to get and keep blood sugars below 150.  - Insulin Glargine (BASAGLAR KWIKPEN) 100 UNIT/ML SOPN; Gradually titrate to 30 units daily  Dispense: 1 pen; Refill: 5  2. Gastroesophageal reflux disease with esophagitis - DG UGI  W/KUB; Future - refer to GI as indicated.   3. Essential hypertension Stable. Continue bp medication as prescribed   4. Recurrent major depressive disorder, remission status unspecified ([place]) Continue anti-depressant therap yas prescribed.   5. Cigarette smoker Smoking cessation discussed and strongly encouraged.  General Counseling: Gurfateh verbalizes understanding of the findings of todays visit and agrees with plan of treatment. I have discussed any further diagnostic evaluation that may be needed or ordered today. We also reviewed his medications today. he has been encouraged to call the office with any questions or concerns that should arise related to todays visit.    Diabetes Counseling:  1. Addition of ACE inh/ ARB'S for nephroprotection. 2. Diabetic foot care, prevention of complications.  3.Exercise and lose weight.  4. Diabetic eye examination, 5. Monitor blood sugar closlely. nutrition counseling.  6.Sign and symptoms of hypoglycemia including shaking sweating,confusion and headaches.  This patient was seen by Leretha Pol, FNP- C in Collaboration with Dr Lavera Guise as a part of collaborative care agreement   Orders Placed This Encounter  Procedures  . DG UGI  W/KUB    Meds ordered this encounter  Medications  . Insulin Glargine (BASAGLAR KWIKPEN) 100 UNIT/ML SOPN    Sig: Gradually titrate to 30 units daily    Dispense:  1 pen    Refill:  5    Order Specific Question:   Supervising Provider    Answer:   Lavera Guise [[id number]]    Time spent: 65 Minutes      Dr Lavera Guise Internal medicine

## 2017-07-20 DIAGNOSIS — F332 Major depressive disorder, recurrent severe without psychotic features: Secondary | ICD-10-CM | POA: Diagnosis not present

## 2017-07-21 ENCOUNTER — Emergency Department: Payer: Medicare Other

## 2017-07-21 ENCOUNTER — Other Ambulatory Visit: Payer: Self-pay

## 2017-07-21 ENCOUNTER — Encounter: Payer: Self-pay | Admitting: Emergency Medicine

## 2017-07-21 ENCOUNTER — Emergency Department
Admission: EM | Admit: 2017-07-21 | Discharge: 2017-07-22 | Disposition: A | Discharge status: Home / Self Care | Payer: Medicare Other | Attending: Emergency Medicine | Admitting: Emergency Medicine

## 2017-07-21 DIAGNOSIS — Y9389 Activity, other specified: Secondary | ICD-10-CM | POA: Insufficient documentation

## 2017-07-21 DIAGNOSIS — I10 Essential (primary) hypertension: Secondary | ICD-10-CM | POA: Insufficient documentation

## 2017-07-21 DIAGNOSIS — Z79899 Other long term (current) drug therapy: Secondary | ICD-10-CM | POA: Insufficient documentation

## 2017-07-21 DIAGNOSIS — Y929 Unspecified place or not applicable: Secondary | ICD-10-CM | POA: Insufficient documentation

## 2017-07-21 DIAGNOSIS — S30811A Abrasion of abdominal wall, initial encounter: Secondary | ICD-10-CM | POA: Insufficient documentation

## 2017-07-21 DIAGNOSIS — F17210 Nicotine dependence, cigarettes, uncomplicated: Secondary | ICD-10-CM | POA: Insufficient documentation

## 2017-07-21 DIAGNOSIS — Z794 Long term (current) use of insulin: Secondary | ICD-10-CM | POA: Diagnosis not present

## 2017-07-21 DIAGNOSIS — S301XXA Contusion of abdominal wall, initial encounter: Secondary | ICD-10-CM | POA: Diagnosis not present

## 2017-07-21 DIAGNOSIS — T148XXA Other injury of unspecified body region, initial encounter: Secondary | ICD-10-CM

## 2017-07-21 DIAGNOSIS — Y999 Unspecified external cause status: Secondary | ICD-10-CM | POA: Insufficient documentation

## 2017-07-21 DIAGNOSIS — S299XXA Unspecified injury of thorax, initial encounter: Secondary | ICD-10-CM | POA: Diagnosis not present

## 2017-07-21 DIAGNOSIS — M79662 Pain in left lower leg: Secondary | ICD-10-CM | POA: Diagnosis not present

## 2017-07-21 DIAGNOSIS — Z7982 Long term (current) use of aspirin: Secondary | ICD-10-CM | POA: Diagnosis not present

## 2017-07-21 DIAGNOSIS — R079 Chest pain, unspecified: Secondary | ICD-10-CM | POA: Diagnosis not present

## 2017-07-21 DIAGNOSIS — E119 Type 2 diabetes mellitus without complications: Secondary | ICD-10-CM | POA: Insufficient documentation

## 2017-07-21 DIAGNOSIS — S3991XA Unspecified injury of abdomen, initial encounter: Secondary | ICD-10-CM | POA: Diagnosis not present

## 2017-07-21 DIAGNOSIS — R0789 Other chest pain: Secondary | ICD-10-CM | POA: Diagnosis not present

## 2017-07-21 DIAGNOSIS — W230XXA Caught, crushed, jammed, or pinched between moving objects, initial encounter: Secondary | ICD-10-CM | POA: Diagnosis not present

## 2017-07-21 DIAGNOSIS — S8992XA Unspecified injury of left lower leg, initial encounter: Secondary | ICD-10-CM | POA: Diagnosis not present

## 2017-07-21 LAB — CBC WITH DIFFERENTIAL/PLATELET
BASOS ABS: 0.1 10*3/uL (ref 0–0.1)
Basophils Relative: 1 %
Eosinophils Absolute: 0.2 10*3/uL (ref 0–0.7)
Eosinophils Relative: 2 %
HEMATOCRIT: 45.8 % (ref 40.0–52.0)
Hemoglobin: 15.9 g/dL (ref 13.0–18.0)
LYMPHS PCT: 33 %
Lymphs Abs: 3.5 10*3/uL (ref 1.0–3.6)
MCH: 29.6 pg (ref 26.0–34.0)
MCHC: 34.7 g/dL (ref 32.0–36.0)
MCV: 85.4 fL (ref 80.0–100.0)
Monocytes Absolute: 0.8 10*3/uL (ref 0.2–1.0)
Monocytes Relative: 8 %
NEUTROS ABS: 6.1 10*3/uL (ref 1.4–6.5)
NEUTROS PCT: 56 %
Platelets: 241 10*3/uL (ref 150–440)
RBC: 5.36 MIL/uL (ref 4.40–5.90)
RDW: 13.5 % (ref 11.5–14.5)
WBC: 10.6 10*3/uL (ref 3.8–10.6)

## 2017-07-21 LAB — COMPREHENSIVE METABOLIC PANEL
ALT: 52 U/L (ref 17–63)
AST: 52 U/L — AB (ref 15–41)
Albumin: 4 g/dL (ref 3.5–5.0)
Alkaline Phosphatase: 133 U/L — ABNORMAL HIGH (ref 38–126)
Anion gap: 13 (ref 5–15)
BUN: 14 mg/dL (ref 6–20)
CHLORIDE: 98 mmol/L — AB (ref 101–111)
CO2: 20 mmol/L — ABNORMAL LOW (ref 22–32)
CREATININE: 1.07 mg/dL (ref 0.61–1.24)
Calcium: 9.3 mg/dL (ref 8.9–10.3)
GFR calc Af Amer: >60 mL/min (ref >60)
Glucose, Bld: 280 mg/dL — ABNORMAL HIGH (ref 65–99)
Potassium: 3.8 mmol/L (ref 3.5–5.1)
Sodium: 131 mmol/L — ABNORMAL LOW (ref 135–145)
Total Bilirubin: 0.4 mg/dL (ref 0.3–1.2)
Total Protein: 8.4 g/dL — ABNORMAL HIGH (ref 6.5–8.1)

## 2017-07-21 LAB — LIPASE, BLOOD: LIPASE: 39 U/L (ref 11–51)

## 2017-07-21 LAB — CK: Total CK: 344 U/L (ref 49–397)

## 2017-07-21 LAB — TROPONIN I: Troponin I: <0.03 ng/mL (ref <0.03)

## 2017-07-21 MED ORDER — FENTANYL CITRATE (PF) 100 MCG/2ML IJ SOLN
100.0000 ug | Freq: Once | INTRAMUSCULAR | Status: AC
  Administered 2017-07-21: 100 ug via INTRAVENOUS
  Filled 2017-07-21: qty 2

## 2017-07-21 MED ORDER — SODIUM CHLORIDE 0.9 % IV BOLUS
1000.0000 mL | Freq: Once | INTRAVENOUS | Status: AC
  Administered 2017-07-21: 1000 mL via INTRAVENOUS

## 2017-07-21 MED ORDER — ONDANSETRON HCL 4 MG/2ML IJ SOLN
4.0000 mg | Freq: Once | INTRAMUSCULAR | Status: AC
  Administered 2017-07-21: 4 mg via INTRAVENOUS
  Filled 2017-07-21: qty 2

## 2017-07-21 MED ORDER — IOPAMIDOL (ISOVUE-300) INJECTION 61%
125.0000 mL | Freq: Once | INTRAVENOUS | Status: AC | PRN
  Administered 2017-07-21: 125 mL via INTRAVENOUS

## 2017-07-21 NOTE — ED Notes (Signed)
Patient taken to CT by this RN.

## 2017-07-21 NOTE — ED Notes (Signed)
XR at bedside

## 2017-07-21 NOTE — ED Notes (Signed)
ED Provider at bedside. 

## 2017-07-21 NOTE — ED Notes (Addendum)
Unsuccessful IV start x1, 16G LAC
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
## 2017-07-22 DIAGNOSIS — S301XXA Contusion of abdominal wall, initial encounter: Secondary | ICD-10-CM | POA: Diagnosis not present

## 2017-07-22 MED ORDER — OXYCODONE-ACETAMINOPHEN 5-325 MG PO TABS: 1.0000 | ORAL_TABLET | ORAL | 0 refills | Status: DC | PRN

## 2017-07-22 MED ORDER — FENTANYL CITRATE (PF) 100 MCG/2ML IJ SOLN
INTRAMUSCULAR | Status: AC
  Administered 2017-07-22: 100 ug via INTRAVENOUS
  Filled 2017-07-22: qty 2

## 2017-07-22 MED ORDER — FENTANYL CITRATE (PF) 100 MCG/2ML IJ SOLN
100.0000 ug | Freq: Once | INTRAMUSCULAR | Status: AC
  Administered 2017-07-22: 100 ug via INTRAVENOUS

## 2017-07-22 NOTE — ED Notes (Signed)
Lab called to request type and screen be discontinued, they were getting a screen telling them they still needed to perform it.

## 2017-07-22 NOTE — ED Notes (Signed)
Lab called and stated type and screen tube hemolyzed - MD notified and declined redraw.

## 2017-07-22 NOTE — ED Notes (Signed)
Patient reports increased pain, will ask MD for orders.

## 2017-07-22 NOTE — ED Notes (Signed)
Patient voided before leaving for discharge, patient denies any blood or abnormal output.

## 2017-07-23 ENCOUNTER — Ambulatory Visit: Payer: Medicare Other

## 2017-07-23 ENCOUNTER — Ambulatory Visit
Admission: RE | Admit: 2017-07-23 | Discharge: 2017-07-23 | Disposition: A | Discharge status: Home / Self Care | Payer: Medicare Other | Source: Ambulatory Visit | Attending: Nurse Practitioner | Admitting: Nurse Practitioner

## 2017-07-23 DIAGNOSIS — K2100 Gastro-esophageal reflux disease with esophagitis, without bleeding: Secondary | ICD-10-CM

## 2017-07-23 DIAGNOSIS — R0602 Shortness of breath: Secondary | ICD-10-CM | POA: Diagnosis not present

## 2017-07-23 LAB — POCT I-STAT CREATININE: CREATININE: 0.9 mg/dL (ref 0.61–1.24)

## 2017-07-26 ENCOUNTER — Telehealth: Payer: Self-pay | Admitting: Dietician
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
## 2017-07-30 ENCOUNTER — Ambulatory Visit: Payer: Medicare Other

## 2017-08-06 ENCOUNTER — Ambulatory Visit: Payer: Medicare Other

## 2017-08-13 ENCOUNTER — Encounter: Payer: Medicare Other | Attending: Nurse Practitioner

## 2017-08-13 DIAGNOSIS — Z713 Dietary counseling and surveillance: Secondary | ICD-10-CM | POA: Insufficient documentation

## 2017-08-13 DIAGNOSIS — E119 Type 2 diabetes mellitus without complications: Secondary | ICD-10-CM | POA: Insufficient documentation

## 2017-08-20 ENCOUNTER — Other Ambulatory Visit: Payer: Self-pay

## 2017-08-20 DIAGNOSIS — K219 Gastro-esophageal reflux disease without esophagitis: Secondary | ICD-10-CM

## 2017-08-20 DIAGNOSIS — I10 Essential (primary) hypertension: Secondary | ICD-10-CM

## 2017-08-20 MED ORDER — METOPROLOL TARTRATE 100 MG PO TABS: 100.0000 mg | ORAL_TABLET | Freq: Two times a day (BID) | ORAL | 3 refills | Status: DC

## 2017-08-20 MED ORDER — GLIMEPIRIDE 4 MG PO TABS: 4.0000 mg | ORAL_TABLET | Freq: Every day | ORAL | 3 refills | Status: DC

## 2017-08-20 MED ORDER — RANITIDINE HCL 150 MG PO TABS: 150.0000 mg | ORAL_TABLET | Freq: Two times a day (BID) | ORAL | 3 refills | Status: DC

## 2017-08-21 ENCOUNTER — Encounter: Payer: Self-pay | Admitting: *Deleted

## 2017-08-23 ENCOUNTER — Ambulatory Visit (INDEPENDENT_AMBULATORY_CARE_PROVIDER_SITE_OTHER): Payer: Medicare Other | Admitting: Nurse Practitioner

## 2017-08-23 ENCOUNTER — Encounter: Payer: Self-pay | Admitting: Nurse Practitioner

## 2017-08-23 VITALS — BP 138/90 | HR 105 | Resp 16 | Ht 69.0 in | Wt 248.0 lb

## 2017-08-23 DIAGNOSIS — I10 Essential (primary) hypertension: Secondary | ICD-10-CM | POA: Diagnosis not present

## 2017-08-23 DIAGNOSIS — E1165 Type 2 diabetes mellitus with hyperglycemia: Secondary | ICD-10-CM

## 2017-08-23 DIAGNOSIS — K2100 Gastro-esophageal reflux disease with esophagitis, without bleeding: Secondary | ICD-10-CM

## 2017-08-23 DIAGNOSIS — E11649 Type 2 diabetes mellitus with hypoglycemia without coma: Secondary | ICD-10-CM

## 2017-08-23 MED ORDER — BASAGLAR KWIKPEN 100 UNIT/ML ~~LOC~~ SOPN: PEN_INJECTOR | SUBCUTANEOUS | 5 refills | Status: DC

## 2017-08-23 MED ORDER — SITAGLIPTIN PHOS-METFORMIN HCL 50-500 MG PO TABS: 1.0000 | ORAL_TABLET | Freq: Two times a day (BID) | ORAL | 3 refills | Status: DC

## 2017-08-23 NOTE — Progress Notes (Signed)
[facility] [place], [place] [address]  Internal MEDICINE  Office Visit Note  Patient Name: Raymond [name]  [id number]  [id number]  Date of Service: 09/12/2017    Pt is here for routine follow up.   Chief Complaint  Patient presents with  . Follow-up    He presents for his follow-up diabetic visit. He has type 2 diabetes mellitus. His disease course has been improving gradually. Hypoglycemia symptoms include nervousness/anxiousness. Associated symptoms include blurred vision, fatigue and weight loss. Pertinent negatives for diabetes include no chest pain. Diabetic complications include heart disease. There is no change He has gradually increased his basal insulin dosing up to 28 units per day. Blood sugars continue to be slightly elevated, though they do consistently improve.      Current Medication: Outpatient Encounter Medications as of 08/23/2017  Medication Sig Note  . ACCU-CHEK FASTCLIX LANCETS MISC yse as directed twice a day   . amLODipine (NORVASC) 5 MG tablet Take 5 mg by mouth daily.   Marland Kitchen aspirin EC 81 MG tablet Take 81 mg by mouth.   Marland Kitchen atorvastatin (LIPITOR) 80 MG tablet TAKE 1 TABLET BY MOUTH ONCE DAILY (Patient not taking: Reported on 06/19/2017)   . benzonatate (TESSALON) 200 MG capsule Take 1 capsule (200 mg total) by mouth 3 (three) times daily as needed for cough. (Patient not taking: Reported on 06/13/2017)   . fluticasone (FLONASE) 50 MCG/ACT nasal spray 2 sprays by Each Nare route daily. 07/06/2017: Ran out  and needs new prescription  . glimepiride (AMARYL) 4 MG tablet Take 1 tablet (4 mg total) by mouth daily with breakfast.   . hydrochlorothiazide (HYDRODIURIL) 25 MG tablet Take 25 mg by mouth daily.  05/30/2017: Patient reports taking but no recent fills per pharmacy records   . hydrOXYzine (VISTARIL) 50 MG capsule Take 50 mg by mouth 2 (two) times daily as needed.   . Insulin Glargine (BASAGLAR KWIKPEN) 100 UNIT/ML SOPN Inject up to 36  units [place] QD   . metoprolol tartrate (LOPRESSOR) 100 MG tablet Take 1 tablet (100 mg total) by mouth 2 (two) times daily.   . mirtazapine (REMERON) 45 MG tablet Take 45 mg by mouth at bedtime.   . nitroGLYCERIN (NITROSTAT) 0.4 MG SL tablet Place 0.4 mg under the tongue every 5 (five) minutes as needed for chest pain.   Marland Kitchen oxyCODONE-acetaminophen (PERCOCET) 5-325 MG tablet Take 1 tablet by mouth every 4 (four) hours as needed for moderate pain or severe pain.   . pantoprazole (PROTONIX) 40 MG tablet Take 1 tablet (40 mg total) by mouth daily.   . QUEtiapine (SEROQUEL XR) 200 MG 24 hr tablet Take 200 mg by mouth at bedtime.    . ranitidine (ZANTAC) 150 MG tablet Take 1 tablet (150 mg total) by mouth 2 (two) times daily.   . simvastatin (ZOCOR) 40 MG tablet Take 40 mg by mouth at bedtime.   . sitaGLIPtin-metformin (JANUMET) 50-500 MG tablet Take 1 tablet by mouth 2 (two) times daily.   . traZODone (DESYREL) 100 MG tablet Take 300 mg by mouth at bedtime.   . [DISCONTINUED] Insulin Glargine (BASAGLAR KWIKPEN) 100 UNIT/ML SOPN Gradually titrate to 30 units daily   . [DISCONTINUED] sitaGLIPtin-metformin (JANUMET) 50-500 MG tablet Take 1 tablet by mouth 2 (two) times daily.    No facility-administered encounter medications on file as of 08/23/2017.     Surgical History: Past Surgical History:  Procedure Laterality Date  . CLAVICLE SURGERY Right   .  SHOULDER SURGERY Right     Medical History: Past Medical History:  Diagnosis Date  . Anxiety   . Depression   . Diabetes mellitus without complication ([place])   . GERD (gastroesophageal reflux disease)   . Headache   . Hyperlipidemia   . Hypertension   . Irregular heart beat unk  . Myocardial infarction ([place])   . PTSD (post-traumatic stress disorder)   . Tachycardia     Family History: Family History  Problem Relation Age of Onset  . Hypertension Mother   . Diabetes type II Mother   . Diabetes Mother   . COPD Father   . Cancer Father   .  Heart disease Father   . Diabetes Sister   . Diabetes Brother   . Diabetes Maternal Aunt   . Diabetes Sister   . Diabetes Sister   . Diabetes Sister     Social History   Socioeconomic History  . Marital status: Married    Spouse name: Not on file  . Number of children: Not on file  . Years of education: Not on file  . Highest education level: Not on file  Occupational History  . Not on file  Social Needs  . Financial resource strain: Not on file  . Food insecurity:    Worry: Not on file    Inability: Not on file  . Transportation needs:    Medical: Not on file    Non-medical: Not on file  Tobacco Use  . Smoking status: Current Every Day Smoker    Packs/day: 1.50    Years: 40.00    Pack years: 60.00    Types: Cigarettes  . Smokeless tobacco: Former Systems developer    Types: Chew  Substance and Sexual Activity  . Alcohol use: No    Alcohol/week: 0.0 oz    Frequency: Never  . Drug use: No  . Sexual activity: Not on file  Lifestyle  . Physical activity:    Days per week: Not on file    Minutes per session: Not on file  . Stress: Not on file  Relationships  . Social connections:    Talks on phone: Not on file    Gets together: Not on file    Attends religious service: Not on file    Active member of club or organization: Not on file    Attends meetings of clubs or organizations: Not on file    Relationship status: Not on file  . Intimate partner violence:    Fear of current or ex partner: Not on file    Emotionally abused: Not on file    Physically abused: Not on file    Forced sexual activity: Not on file  Other Topics Concern  . Not on file  Social History Narrative  . Not on file      Review of Systems  Constitutional: Positive for fatigue. Negative for activity change, appetite change and unexpected weight change.  HENT: Negative for congestion, postnasal drip, sore throat and voice change.   Eyes: Negative.   Respiratory: Negative for cough, chest tightness  and wheezing.   Cardiovascular: Negative for chest pain, palpitations and leg swelling.       Blood pressure improved today.  Gastrointestinal: Positive for abdominal pain. Negative for diarrhea, nausea and vomiting.       Increased GERD symptoms.   Endocrine: Positive for polydipsia and polyuria.       States he is checking his blood sugars more consistently. Now running in  low 200s for most part.   Genitourinary: Negative.   Musculoskeletal: Negative for arthralgias, back pain and myalgias.  Skin: Negative for color change, pallor, rash and wound.  Allergic/Immunologic: Positive for environmental allergies.  Neurological: Negative for dizziness and weakness.  Hematological: Negative for adenopathy.  Psychiatric/Behavioral: Negative for behavioral problems and suicidal ideas. The patient is nervous/anxious.     Today's Vitals   08/23/17 1203  BP: 138/90  Pulse: (!) 105  Resp: 16  SpO2: 95%  Weight: 248 lb (112.5 kg)  Height: 5\' 9"  (1.753 m)    Physical Exam  Constitutional: He is oriented to person, place, and time. He appears well-developed and well-nourished. No distress.  HENT:  Head: Normocephalic and atraumatic.  Mouth/Throat: Oropharynx is clear and moist. No oropharyngeal exudate.  Eyes: Pupils are equal, round, and reactive to light. EOM are normal.  Neck: Normal range of motion. Neck supple. No JVD present. Carotid bruit is not present. No tracheal deviation present. No thyromegaly present.  Cardiovascular: Normal rate, regular rhythm, normal heart sounds and intact distal pulses. Exam reveals no gallop and no friction rub.  No murmur heard. Pulmonary/Chest: Effort normal and breath sounds normal. No respiratory distress. He has no wheezes. He has no rales. He exhibits no tenderness.  Abdominal: Soft. Bowel sounds are normal. There is no tenderness.  Musculoskeletal: Normal range of motion.  Lymphadenopathy:    He has no cervical adenopathy.  Neurological: He is  alert and oriented to person, place, and time. No cranial nerve deficit.  Skin: Skin is warm and dry. He is not diaphoretic.  Psychiatric: He has a normal mood and affect. His behavior is normal. Judgment and thought content normal.  Nursing note and vitals reviewed.  Assessment/Plan: 1. Uncontrolled type 2 diabetes mellitus with hypoglycemia without coma ([place]) Blood sugars improving but still too high. Gradually increase dose of basaglar to 36 units every day. May stop increasing dose if sugars are consistently 150 or less.  - Insulin Glargine (BASAGLAR KWIKPEN) 100 UNIT/ML SOPN; Inject up to 36 units [place] QD  Dispense: 5 pen; Refill: 5  2. Uncontrolled type 2 diabetes mellitus with hyperglycemia (HCC) Janumet 50/500mg  twice daily. Samples provided today. Continue to monitor sugars daily.  - sitaGLIPtin-metformin (JANUMET) 50-500 MG tablet; Take 1 tablet by mouth 2 (two) times daily.  Dispense: 60 tablet; Refill: 3  3. Essential hypertension Improved. Continue bp medication as prescribed.   4. Gastroesophageal reflux disease with esophagitis Continue pantoprazole 40mg  daily with zantac 150mg  twice daily if needed.   General Counseling: Raymond [name] verbalizes understanding of the findings of todays visit and agrees with plan of treatment. I have discussed any further diagnostic evaluation that may be needed or ordered today. We also reviewed his medications today. he has been encouraged to call the office with any questions or concerns that should arise related to todays visit.  Diabetes Counseling:  1. Addition of ACE inh/ ARB'S for nephroprotection. 2. Diabetic foot care, prevention of complications.  3.Exercise and lose weight.  4. Diabetic eye examination, 5. Monitor blood sugar closlely. nutrition counseling.  6.Sign and symptoms of hypoglycemia including shaking sweating,confusion and headaches.   This patient was seen by Leretha Pol, FNP- C in Collaboration with Dr Lavera Guise as  a part of collaborative care agreement  Meds ordered this encounter  Medications  . Insulin Glargine (BASAGLAR KWIKPEN) 100 UNIT/ML SOPN    Sig: Inject up to 36 units [place] QD    Dispense:  5 pen  Refill:  5    Order Specific Question:   Supervising Provider    Answer:   Lavera Guise [[id number]]  . sitaGLIPtin-metformin (JANUMET) 50-500 MG tablet    Sig: Take 1 tablet by mouth 2 (two) times daily.    Dispense:  60 tablet    Refill:  3    Order Specific Question:   Supervising Provider    Answer:   Lavera Guise [[id number]]    Time spent: 54 Minutes     Dr Lavera Guise Internal medicine

## 2017-09-12 DIAGNOSIS — K210 Gastro-esophageal reflux disease with esophagitis: Secondary | ICD-10-CM

## 2017-09-13 ENCOUNTER — Ambulatory Visit: Payer: Medicare Other

## 2017-09-18 ENCOUNTER — Other Ambulatory Visit: Payer: Self-pay | Admitting: Nurse Practitioner

## 2017-09-18 DIAGNOSIS — K219 Gastro-esophageal reflux disease without esophagitis: Secondary | ICD-10-CM

## 2017-09-18 MED ORDER — PANTOPRAZOLE SODIUM 40 MG PO TBEC: 40.0000 mg | DELAYED_RELEASE_TABLET | Freq: Every day | ORAL | 3 refills | Status: DC

## 2017-09-20 ENCOUNTER — Ambulatory Visit: Payer: Medicare Other

## 2017-09-27 ENCOUNTER — Ambulatory Visit: Payer: Medicare Other

## 2017-10-05 ENCOUNTER — Ambulatory Visit (INDEPENDENT_AMBULATORY_CARE_PROVIDER_SITE_OTHER): Payer: Medicare Other | Admitting: Nurse Practitioner

## 2017-10-05 ENCOUNTER — Encounter: Payer: Self-pay | Admitting: Nurse Practitioner

## 2017-10-05 VITALS — BP 153/98 | HR 108 | Resp 16 | Ht 69.0 in | Wt 242.6 lb

## 2017-10-05 DIAGNOSIS — I10 Essential (primary) hypertension: Secondary | ICD-10-CM

## 2017-10-05 DIAGNOSIS — L600 Ingrowing nail: Secondary | ICD-10-CM | POA: Diagnosis not present

## 2017-10-05 DIAGNOSIS — R300 Dysuria: Secondary | ICD-10-CM | POA: Diagnosis not present

## 2017-10-05 DIAGNOSIS — E1165 Type 2 diabetes mellitus with hyperglycemia: Secondary | ICD-10-CM | POA: Diagnosis not present

## 2017-10-05 DIAGNOSIS — Z0001 Encounter for general adult medical examination with abnormal findings: Secondary | ICD-10-CM

## 2017-10-05 DIAGNOSIS — B351 Tinea unguium: Secondary | ICD-10-CM

## 2017-10-05 DIAGNOSIS — K219 Gastro-esophageal reflux disease without esophagitis: Secondary | ICD-10-CM | POA: Diagnosis not present

## 2017-10-05 LAB — POCT GLYCOSYLATED HEMOGLOBIN (HGB A1C): Hemoglobin A1C: 13.4 % — AB (ref 4.0–5.6)

## 2017-10-05 MED ORDER — GLUCOSE BLOOD VI STRP: ORAL_STRIP | 12 refills | Status: DC

## 2017-10-05 MED ORDER — INSULIN ASPART 100 UNIT/ML FLEXPEN: PEN_INJECTOR | SUBCUTANEOUS | 11 refills | Status: DC

## 2017-10-05 NOTE — Progress Notes (Signed)
[facility] [place], [place] [address]  Internal MEDICINE  Office Visit Note  Patient Name: Raymond [name]  [id number]  [id number]  Date of Service: 10/27/2017   Pt is here for routine health maintenance examination  Chief Complaint  Patient presents with  . Annual Exam  . Diabetes     The patient states that he has been taking care of his sister, who has been bedridden after having surgery on her knee Is having trouble keeping his medication and his sister's medication straight. Has forgotten, multiple times, to include his Janumet when preparing his meds for the week. Continues to take his Basaglar at 34 units daily. Also taking amaryl every day.   Diabetes  He presents for his follow-up diabetic visit. He has type 2 diabetes mellitus. His disease course has been worsening. Hypoglycemia symptoms include nervousness/anxiousness. Pertinent negatives for hypoglycemia include no dizziness, headaches or pallor. Associated symptoms include fatigue, polydipsia and polyuria. Pertinent negatives for diabetes include no chest pain and no weakness. Hypoglycemia complications include nocturnal hypoglycemia. Symptoms are stable. There are no diabetic complications. Risk factors for coronary artery disease include diabetes mellitus, dyslipidemia, hypertension, male sex, stress and tobacco exposure. Current diabetic treatment includes oral agent (dual therapy) and insulin injections. He is compliant with treatment some of the time. His weight is stable. He is following a generally healthy diet. Meal planning includes avoidance of concentrated sweets. He has not had a previous visit with a dietitian. He participates in exercise intermittently. His home blood glucose trend is fluctuating minimally. An ACE inhibitor/angiotensin II receptor blocker is not being taken. He does not see a podiatrist.Eye exam is not current.     Current Medication: Outpatient Encounter Medications  as of 10/05/2017  Medication Sig Note  . ACCU-CHEK FASTCLIX LANCETS MISC yse as directed twice a day   . amLODipine (NORVASC) 5 MG tablet Take 5 mg by mouth daily.   Marland Kitchen aspirin EC 81 MG tablet Take 81 mg by mouth.   . fluticasone (FLONASE) 50 MCG/ACT nasal spray 2 sprays by Each Nare route daily. 07/06/2017: Ran out  and needs new prescription  . glimepiride (AMARYL) 4 MG tablet Take 1 tablet (4 mg total) by mouth daily with breakfast.   . glucose blood (ACCU-CHEK AVIVA PLUS) test strip Blood sugar testing TID and as needed. E11.65   . hydrochlorothiazide (HYDRODIURIL) 25 MG tablet Take 25 mg by mouth daily.  05/30/2017: Patient reports taking but no recent fills per pharmacy records   . hydrOXYzine (VISTARIL) 50 MG capsule Take 50 mg by mouth 2 (two) times daily as needed.   . insulin aspart (NOVOLOG FLEXPEN) 100 UNIT/ML FlexPen Use as directed per sliding scale. Instructions provided.   . Insulin Glargine (BASAGLAR KWIKPEN) 100 UNIT/ML SOPN Inject up to 36 units [place] QD   . metoprolol tartrate (LOPRESSOR) 100 MG tablet Take 1 tablet (100 mg total) by mouth 2 (two) times daily.   . mirtazapine (REMERON) 45 MG tablet Take 45 mg by mouth at bedtime.   . nitroGLYCERIN (NITROSTAT) 0.4 MG SL tablet Place 0.4 mg under the tongue every 5 (five) minutes as needed for chest pain.   Marland Kitchen oxyCODONE-acetaminophen (PERCOCET) 5-325 MG tablet Take 1 tablet by mouth every 4 (four) hours as needed for moderate pain or severe pain.   . pantoprazole (PROTONIX) 40 MG tablet Take 1 tablet (40 mg total) by mouth daily.   . QUEtiapine (SEROQUEL XR) 200 MG 24 hr tablet Take 200 mg  by mouth at bedtime.    . ranitidine (ZANTAC) 150 MG tablet Take 1 tablet (150 mg total) by mouth 2 (two) times daily.   . simvastatin (ZOCOR) 40 MG tablet Take 40 mg by mouth at bedtime.   . sitaGLIPtin-metformin (JANUMET) 50-500 MG tablet Take 1 tablet by mouth 2 (two) times daily.   . traZODone (DESYREL) 100 MG tablet Take 300 mg by mouth at  bedtime.   . [DISCONTINUED] atorvastatin (LIPITOR) 80 MG tablet TAKE 1 TABLET BY MOUTH ONCE DAILY (Patient not taking: Reported on 06/19/2017)   . [DISCONTINUED] benzonatate (TESSALON) 200 MG capsule Take 1 capsule (200 mg total) by mouth 3 (three) times daily as needed for cough. (Patient not taking: Reported on 06/13/2017)   . [DISCONTINUED] glucose blood (ACCU-CHEK AVIVA) test strip Blood sugar testing done 3 times daily. E11.65    No facility-administered encounter medications on file as of 10/05/2017.     Surgical History: Past Surgical History:  Procedure Laterality Date  .  ingrown toenail removal    . CLAVICLE SURGERY Right   . SHOULDER SURGERY Right     Medical History: Past Medical History:  Diagnosis Date  . Anxiety   . Depression   . Diabetes mellitus without complication ([place])   . GERD (gastroesophageal reflux disease)   . Headache   . Hyperlipidemia   . Hypertension   . Irregular heart beat unk  . Myocardial infarction ([place])   . PTSD (post-traumatic stress disorder)   . Tachycardia     Family History: Family History  Problem Relation Age of Onset  . Hypertension Mother   . Diabetes type II Mother   . Diabetes Mother   . COPD Father   . Cancer Father   . Heart disease Father   . Diabetes Sister   . Diabetes Brother   . Diabetes Maternal Aunt   . Diabetes Sister   . Diabetes Sister   . Diabetes Sister       Review of Systems  Constitutional: Positive for fatigue. Negative for activity change, appetite change and unexpected weight change.  HENT: Negative for congestion, postnasal drip, sore throat and voice change.   Eyes: Negative.   Respiratory: Negative for cough, chest tightness and wheezing.   Cardiovascular: Negative for chest pain, palpitations and leg swelling.       Blood pressure improved today.  Gastrointestinal: Positive for abdominal pain. Negative for diarrhea, nausea and vomiting.       Increased GERD symptoms.   Endocrine: Positive  for polydipsia and polyuria.       States he is checking his blood sugars more consistently. Now running in low 200s for most part.   Genitourinary: Negative for dysuria, flank pain, frequency, hematuria and testicular pain.  Musculoskeletal: Negative for arthralgias, back pain and myalgias.  Skin: Negative for color change, pallor, rash and wound.  Allergic/Immunologic: Positive for environmental allergies.  Neurological: Negative for dizziness, weakness and headaches.  Hematological: Negative for adenopathy.  Psychiatric/Behavioral: Negative for behavioral problems, dysphoric mood and suicidal ideas. The patient is nervous/anxious.     Today's Vitals   10/05/17 1416  BP: (!) 153/98  Pulse: (!) 108  Resp: 16  SpO2: 94%  Weight: 242 lb 9.6 oz (110 kg)  Height: 5\' 9"  (1.753 m)    Physical Exam  Constitutional: He is oriented to person, place, and time. He appears well-developed and well-nourished. No distress.  HENT:  Head: Normocephalic and atraumatic.  Nose: Nose normal.  Mouth/Throat: Oropharynx is clear  and moist. No oropharyngeal exudate.  Eyes: Pupils are equal, round, and reactive to light. Conjunctivae and EOM are normal.  Neck: Normal range of motion. Neck supple. No JVD present. Carotid bruit is not present. No tracheal deviation present. No thyromegaly present.  Cardiovascular: Normal rate, regular rhythm, normal heart sounds and intact distal pulses. Exam reveals no gallop and no friction rub.  No murmur heard. Pulmonary/Chest: Effort normal and breath sounds normal. No respiratory distress. He has no wheezes. He has no rales. He exhibits no tenderness.  Abdominal: Soft. Bowel sounds are normal. There is no tenderness.  Musculoskeletal: Normal range of motion.  Lymphadenopathy:    He has no cervical adenopathy.  Neurological: He is alert and oriented to person, place, and time. No cranial nerve deficit.  Skin: Skin is warm and dry. Capillary refill takes 2 to 3  seconds. He is not diaphoretic.  Thickened and yellowed toenails, bilateral feet.   Psychiatric: He has a normal mood and affect. His behavior is normal. Judgment and thought content normal.  Nursing note and vitals reviewed.    LABS: Recent Results (from the past 2160 hour(s))  Urinalysis, Routine w reflex microscopic     Status: Abnormal   Collection Time: 10/05/17  2:30 PM  Result Value Ref Range   Specific Gravity, UA      >=1.030 (A) 1.005 - 1.030   pH, UA 5.5 5.0 - 7.5   Color, UA Yellow Yellow   Appearance Ur Clear Clear   Leukocytes, UA Negative Negative   Protein, UA Negative Negative/Trace   Glucose, UA 3+ (A) Negative   Ketones, UA Negative Negative   RBC, UA Negative Negative   Bilirubin, UA Negative Negative   Urobilinogen, Ur 0.2 0.2 - 1.0 mg/dL   Nitrite, UA Negative Negative   Microscopic Examination Comment     Comment: Microscopic not indicated and not performed.  Microalbumin, urine     Status: None   Collection Time: 10/05/17  2:30 PM  Result Value Ref Range   Microalbumin, Urine 28.6 Not Estab. ug/mL  POCT HgB A1C     Status: Abnormal   Collection Time: 10/05/17  3:37 PM  Result Value Ref Range   Hemoglobin A1C 13.4 (A) 4.0 - 5.6 %   HbA1c, POC (prediabetic range)  5.7 - 6.4 %   HbA1c, POC (controlled diabetic range)  0.0 - 7.0 %    Assessment/Plan: 1. Encounter for general adult medical examination with abnormal findings Annual health maintenance exam performed today.  - Urinalysis, Routine w reflex microscopic  2. Uncontrolled type 2 diabetes mellitus with hyperglycemia (HCC) HgbA1c 12.4 today. Continue to increase basaglar slowly and steadily in order to get blood sugars consistently under 150. Use sliding scale insulin as prescribed and as needed. Continue other diabetic medications as prescribed. Refer to both opthalmology and podiatry/ - Microalbumin, urine - POCT HgB A1C - Ambulatory referral to Ophthalmology - Ambulatory referral to  Podiatry - glucose blood (ACCU-CHEK AVIVA PLUS) test strip; Blood sugar testing TID and as needed. E11.65  Dispense: 100 each; Refill: 12 - insulin aspart (NOVOLOG FLEXPEN) 100 UNIT/ML FlexPen; Use as directed per sliding scale. Instructions provided.  Dispense: 15 mL; Refill: 11  3. Essential hypertension Stable. Continue bp medication as prescribed .  4. Gastroesophageal reflux disease without esophagitis Continue meds for reflux as prescribed and keep regular visits with GI as scheduled.   5. Onychomycosis with ingrown toenail Refer to podiatry for further evaluation and treatment.  - Ambulatory referral to  Podiatry  6. Dysuria - Urinalysis, Routine w reflex microscopic  General Counseling: Mylen verbalizes understanding of the findings of todays visit and agrees with plan of treatment. I have discussed any further diagnostic evaluation that may be needed or ordered today. We also reviewed his medications today. he has been encouraged to call the office with any questions or concerns that should arise related to todays visit.    Counseling:  Diabetes Counseling:  1. Addition of ACE inh/ ARB'S for nephroprotection. 2. Diabetic foot care, prevention of complications.  3.Exercise and lose weight.  4. Diabetic eye examination, 5. Monitor blood sugar closlely. nutrition counseling.  6.Sign and symptoms of hypoglycemia including shaking sweating,confusion and headaches.   This patient was seen by Leretha Pol, FNP- C in Collaboration with Dr Lavera Guise as a part of collaborative care agreement   Orders Placed This Encounter  Procedures  . Urinalysis, Routine w reflex microscopic  . Microalbumin, urine  . Ambulatory referral to Ophthalmology  . Ambulatory referral to Podiatry  . POCT HgB A1C    Meds ordered this encounter  Medications  . DISCONTD: glucose blood (ACCU-CHEK AVIVA) test strip    Sig: Blood sugar testing done 3 times daily. E11.65    Dispense:  100 each     Refill:  12    Order Specific Question:   Supervising Provider    Answer:   Lavera Guise [[id number]]  . glucose blood (ACCU-CHEK AVIVA PLUS) test strip    Sig: Blood sugar testing TID and as needed. E11.65    Dispense:  100 each    Refill:  12    Order Specific Question:   Supervising Provider    Answer:   Lavera Guise [[id number]]  . insulin aspart (NOVOLOG FLEXPEN) 100 UNIT/ML FlexPen    Sig: Use as directed per sliding scale. Instructions provided.    Dispense:  15 mL    Refill:  11    Sample provided today    Order Specific Question:   Supervising Provider    Answer:   Lavera Guise [[id number]]    Time spent:30 [place], MD  Internal Medicine

## 2017-10-06 LAB — URINALYSIS, ROUTINE W REFLEX MICROSCOPIC
Bilirubin, UA: NEGATIVE
KETONES UA: NEGATIVE
Leukocytes, UA: NEGATIVE
NITRITE UA: NEGATIVE
Protein, UA: NEGATIVE
RBC, UA: NEGATIVE
Urobilinogen, Ur: 0.2 mg/dL (ref 0.2–1.0)
pH, UA: 5.5 (ref 5.0–7.5)

## 2017-10-06 LAB — MICROALBUMIN, URINE: Microalbumin, Urine: 28.6 ug/mL

## 2017-10-18 ENCOUNTER — Ambulatory Visit (INDEPENDENT_AMBULATORY_CARE_PROVIDER_SITE_OTHER): Payer: Medicare Other | Admitting: Podiatry

## 2017-10-18 ENCOUNTER — Encounter: Payer: Self-pay | Admitting: Podiatry

## 2017-10-18 DIAGNOSIS — L03032 Cellulitis of left toe: Secondary | ICD-10-CM

## 2017-10-18 NOTE — Progress Notes (Signed)
This patient presents to the office with chief complaint of a painful ingrown toenail big toe left foot.  He says he has a history of ingrowing toenails which he usually is able to cut out himself.  He says he has severe pain noted at the tip of the inside  border left big toe.  He says the pain is severe and he cannot cut it out himself, so therefore, he presents the office today for an evaluation and treatment.  He says there has been previously had pus  present along this nail groove.  This patient is type II diabetic with an A1c of 13 and fasting blood sugar of over 200. He presents the office today for diabetic foot exam and for treatment and evaluation of the painful ingrowing toenail  General Appearance  Alert, conversant and in no acute stress.  Vascular  Dorsalis pedis and posterior tibial  pulses are palpable  bilaterally.  Capillary return is within normal limits  bilaterally. Temperature is within normal limits  Bilaterally. Hair present on digits.  Neurologic  Senn-Weinstein monofilament wire test within normal limits  bilaterally. Muscle power within normal limits bilaterally.  Nails marked incurvation noted along the medial border left great toe.  Patient does have a combination of pincer and ingrowing toenails on multiple digits on both  Feet.  The left great toenail is the only nail with severe pain which is out of proportion to what is seen.  Orthopedic  No limitations of motion of motion feet .  No crepitus or effusions noted.  No bony pathology or digital deformities noted.  Skin  normotropic skin with no porokeratosis noted bilaterally.  No signs of infections or ulcers noted.    Paronychia medial border left great toe.  Diabetes with no foot complications . Multiple pincer nails  B/L  IE.  Diabetic foot exam reveals no vascular or neurologic disease.  Nail surgery left hallux.  Treatment options and alternatives discussed.  Recommended an incision and drainage and patient  agreed.  Left hallux  was prepped with alcohol and a 3cc. of  2% lidocaine plain was administered in a digital block fashion.  The toe was then prepped with betadine solution .  The offending nail border was then excised and all necrotic tissue was resected.  The area was then cleansed  and antibiotic ointment and a dry sterile dressing was applied.  The patient was dispensed instructions for aftercare.  RTC 1 week.  I was hesitant on phenolizing  the nail due to the elevation of his blood sugar and 1AC  which  is elevated.  Patient should check with his medical doctor about future nail surgery.   Gardiner Barefoot DPM

## 2017-10-23 ENCOUNTER — Encounter: Payer: Self-pay | Admitting: Nurse Practitioner

## 2017-10-23 ENCOUNTER — Ambulatory Visit (INDEPENDENT_AMBULATORY_CARE_PROVIDER_SITE_OTHER): Payer: Medicare Other | Admitting: Nurse Practitioner

## 2017-10-23 VITALS — BP 128/90 | HR 99 | Resp 16 | Ht 69.0 in | Wt 244.2 lb

## 2017-10-23 DIAGNOSIS — K219 Gastro-esophageal reflux disease without esophagitis: Secondary | ICD-10-CM | POA: Diagnosis not present

## 2017-10-23 DIAGNOSIS — I10 Essential (primary) hypertension: Secondary | ICD-10-CM | POA: Diagnosis not present

## 2017-10-23 DIAGNOSIS — E1165 Type 2 diabetes mellitus with hyperglycemia: Secondary | ICD-10-CM

## 2017-10-23 NOTE — Progress Notes (Signed)
[facility] [place], [place] [address]  Internal MEDICINE  Office Visit Note  Patient Name: Raymond [name]  [id number]  [id number]  Date of Service: 11/14/2017   Pt is here for routine follow up.   Chief Complaint  Patient presents with  . Diabetes    3WK FOLLOW UP    The patient states that blood sugars are improving. Taking basaglar 30 units every day. Has been remembering oral medications every dose. Sugars running in mid to high 100s. Feels good, overall. Blood pressure improved.   Diabetes  He presents for his follow-up diabetic visit. He has type 2 diabetes mellitus. His disease course has been improving. Hypoglycemia symptoms include nervousness/anxiousness. Pertinent negatives for hypoglycemia include no dizziness, headaches or pallor. Associated symptoms include fatigue, polydipsia and polyuria. Pertinent negatives for diabetes include no chest pain and no weakness. Hypoglycemia complications include nocturnal hypoglycemia. Symptoms are stable. There are no diabetic complications. Risk factors for coronary artery disease include diabetes mellitus, dyslipidemia, hypertension, male sex, stress and tobacco exposure. Current diabetic treatment includes oral agent (dual therapy) and insulin injections. He is compliant with treatment some of the time. His weight is stable. He is following a generally healthy diet. Meal planning includes avoidance of concentrated sweets. He has not had a previous visit with a dietitian. He participates in exercise intermittently. His home blood glucose trend is fluctuating minimally. An ACE inhibitor/angiotensin II receptor blocker is not being taken. He does not see a podiatrist.Eye exam is not current.       Current Medication: Outpatient Encounter Medications as of 10/23/2017  Medication Sig Note  . ACCU-CHEK FASTCLIX LANCETS MISC yse as directed twice a day   . amLODipine (NORVASC) 5 MG tablet Take 5 mg by mouth daily.    Marland Kitchen aspirin EC 81 MG tablet Take 81 mg by mouth.   Marland Kitchen atorvastatin (LIPITOR) 80 MG tablet Take by mouth.   . fluticasone (FLONASE) 50 MCG/ACT nasal spray 2 sprays by Each Nare route daily. 07/06/2017: Ran out  and needs new prescription  . gabapentin (NEURONTIN) 100 MG capsule Take by mouth.   Marland Kitchen glimepiride (AMARYL) 4 MG tablet Take 1 tablet (4 mg total) by mouth daily with breakfast.   . glucose blood (ACCU-CHEK AVIVA PLUS) test strip Blood sugar testing TID and as needed. E11.65   . hydrochlorothiazide (HYDRODIURIL) 25 MG tablet Take 25 mg by mouth daily.  05/30/2017: Patient reports taking but no recent fills per pharmacy records   . hydrOXYzine (ATARAX/VISTARIL) 50 MG tablet    . hydrOXYzine (VISTARIL) 50 MG capsule Take 50 mg by mouth 2 (two) times daily as needed.   . insulin aspart (NOVOLOG FLEXPEN) 100 UNIT/ML FlexPen Use as directed per sliding scale. Instructions provided.   . Insulin Glargine (BASAGLAR KWIKPEN) 100 UNIT/ML SOPN Inject up to 36 units  QD   . metoprolol succinate (TOPROL-XL) 100 MG 24 hr tablet Take by mouth.   . metoprolol tartrate (LOPRESSOR) 100 MG tablet Take 1 tablet (100 mg total) by mouth 2 (two) times daily.   . mirtazapine (REMERON) 45 MG tablet Take 45 mg by mouth at bedtime.   . nitroGLYCERIN (NITROSTAT) 0.4 MG SL tablet Place 0.4 mg under the tongue every 5 (five) minutes as needed for chest pain.   Marland Kitchen omeprazole (PRILOSEC) 40 MG capsule Take by mouth.   . oxyCODONE-acetaminophen (PERCOCET) 5-325 MG tablet Take 1 tablet by mouth every 4 (four) hours as needed for moderate pain or severe pain.   Marland Kitchen  pantoprazole (PROTONIX) 40 MG tablet Take 1 tablet (40 mg total) by mouth daily.   . QUEtiapine (SEROQUEL XR) 200 MG 24 hr tablet Take 200 mg by mouth at bedtime.    . ranitidine (ZANTAC) 150 MG tablet Take 1 tablet (150 mg total) by mouth 2 (two) times daily.   . simvastatin (ZOCOR) 40 MG tablet Take 40 mg by mouth at bedtime.   . sitaGLIPtin-metformin (JANUMET)  50-500 MG tablet Take 1 tablet by mouth 2 (two) times daily.   . traZODone (DESYREL) 100 MG tablet Take 300 mg by mouth at bedtime.    No facility-administered encounter medications on file as of 10/23/2017.     Surgical History: Past Surgical History:  Procedure Laterality Date  .  ingrown toenail removal    . CLAVICLE SURGERY Right   . SHOULDER SURGERY Right     Medical History: Past Medical History:  Diagnosis Date  . Anxiety   . Depression   . Diabetes mellitus without complication ([place])   . GERD (gastroesophageal reflux disease)   . Headache   . Hyperlipidemia   . Hypertension   . Irregular heart beat unk  . Myocardial infarction ([place])   . PTSD (post-traumatic stress disorder)   . Tachycardia     Family History: Family History  Problem Relation Age of Onset  . Hypertension Mother   . Diabetes type II Mother   . Diabetes Mother   . COPD Father   . Cancer Father   . Heart disease Father   . Diabetes Sister   . Diabetes Brother   . Diabetes Maternal Aunt   . Diabetes Sister   . Diabetes Sister   . Diabetes Sister     Social History   Socioeconomic History  . Marital status: Married    Spouse name: Not on file  . Number of children: Not on file  . Years of education: Not on file  . Highest education level: Not on file  Occupational History  . Not on file  Social Needs  . Financial resource strain: Not on file  . Food insecurity:    Worry: Not on file    Inability: Not on file  . Transportation needs:    Medical: Not on file    Non-medical: Not on file  Tobacco Use  . Smoking status: Current Every Day Smoker    Packs/day: 1.50    Years: 40.00    Pack years: 60.00    Types: Cigarettes  . Smokeless tobacco: Former Systems developer    Types: Chew  Substance and Sexual Activity  . Alcohol use: No    Alcohol/week: 0.0 oz    Frequency: Never  . Drug use: No  . Sexual activity: Not on file  Lifestyle  . Physical activity:    Days per week: Not on file     Minutes per session: Not on file  . Stress: Not on file  Relationships  . Social connections:    Talks on phone: Not on file    Gets together: Not on file    Attends religious service: Not on file    Active member of club or organization: Not on file    Attends meetings of clubs or organizations: Not on file    Relationship status: Not on file  . Intimate partner violence:    Fear of current or ex partner: Not on file    Emotionally abused: Not on file    Physically abused: Not on file  Forced sexual activity: Not on file  Other Topics Concern  . Not on file  Social History Narrative  . Not on file      Review of Systems  Constitutional: Positive for fatigue. Negative for activity change, appetite change and unexpected weight change.  HENT: Negative for congestion, postnasal drip, sore throat and voice change.   Eyes: Negative.   Respiratory: Negative for cough, chest tightness and wheezing.   Cardiovascular: Negative for chest pain, palpitations and leg swelling.       Blood pressure improved today.  Gastrointestinal: Positive for abdominal pain. Negative for diarrhea, nausea and vomiting.        GERD symptoms.   Endocrine: Positive for polydipsia and polyuria.       States he is checking his blood sugars more consistently. Now running in mid to high 100s   Genitourinary: Negative for dysuria, flank pain, frequency, hematuria and testicular pain.  Musculoskeletal: Negative for arthralgias, back pain and myalgias.  Skin: Negative for color change, pallor, rash and wound.  Allergic/Immunologic: Positive for environmental allergies.  Neurological: Negative for dizziness, weakness and headaches.  Hematological: Negative for adenopathy.  Psychiatric/Behavioral: Negative for behavioral problems, dysphoric mood and suicidal ideas. The patient is nervous/anxious.     Today's Vitals   10/23/17 1202  BP: 128/90  Pulse: 99  Resp: 16  SpO2: 95%  Weight: 244 lb 3.2 oz (110.8  kg)  Height: 5\' 9"  (1.753 m)    Physical Exam  Constitutional: He is oriented to person, place, and time. He appears well-developed and well-nourished. No distress.  HENT:  Head: Normocephalic and atraumatic.  Nose: Nose normal.  Mouth/Throat: Oropharynx is clear and moist. No oropharyngeal exudate.  Eyes: Pupils are equal, round, and reactive to light. Conjunctivae and EOM are normal.  Neck: Normal range of motion. Neck supple. No JVD present. Carotid bruit is not present. No tracheal deviation present. No thyromegaly present.  Cardiovascular: Normal rate, regular rhythm and normal heart sounds. Exam reveals no gallop and no friction rub.  No murmur heard. Pulmonary/Chest: Effort normal and breath sounds normal. No respiratory distress. He has no wheezes. He has no rales. He exhibits no tenderness.  Abdominal: Soft. Bowel sounds are normal. There is no tenderness.  Musculoskeletal: Normal range of motion.  Lymphadenopathy:    He has no cervical adenopathy.  Neurological: He is alert and oriented to person, place, and time. No cranial nerve deficit.  Skin: Skin is warm and dry. Capillary refill takes 2 to 3 seconds. He is not diaphoretic.  Psychiatric: He has a normal mood and affect. His behavior is normal. Judgment and thought content normal.  Nursing note and vitals reviewed.  Assessment/Plan: 1. Uncontrolled type 2 diabetes mellitus with hyperglycemia (HCC) Continue to titrate basaglar to 36 units. Reviewed goal to get blood sugars to be consistently under 150 without use of sliding scale insulin. Continue to use oral medication as prescribed .  2. Essential hypertension Stable. Continue bp medication as prescribed   3. Gastroesophageal reflux disease without esophagitis Continue pantoprazole daily and use zantac as needed and as prescribed.   General Counseling: Raymond [name] verbalizes understanding of the findings of todays visit and agrees with plan of treatment. I have discussed  any further diagnostic evaluation that may be needed or ordered today. We also reviewed his medications today. he has been encouraged to call the office with any questions or concerns that should arise related to todays visit.    Counseling:  Diabetes Counseling:  1. Addition  of ACE inh/ ARB'S for nephroprotection. Microalbumin is updated  2. Diabetic foot care, prevention of complications. Podiatry consult 3. Exercise and lose weight.  4. Diabetic eye examination, Diabetic eye exam is updated  5. Monitor blood sugar closlely. nutrition counseling.  6. Sign and symptoms of hypoglycemia including shaking sweating,confusion and headaches.   This patient was seen by Leretha Pol FNP Collaboration with Dr Lavera Guise as a part of collaborative care agreement  Time spent: 22 Minutes     Dr Lavera Guise Internal medicine

## 2017-10-27 DIAGNOSIS — L600 Ingrowing nail: Secondary | ICD-10-CM

## 2017-10-27 DIAGNOSIS — E1165 Type 2 diabetes mellitus with hyperglycemia: Secondary | ICD-10-CM | POA: Insufficient documentation

## 2017-10-27 DIAGNOSIS — B351 Tinea unguium: Secondary | ICD-10-CM | POA: Insufficient documentation

## 2017-10-27 DIAGNOSIS — R300 Dysuria: Secondary | ICD-10-CM | POA: Insufficient documentation

## 2017-10-27 DIAGNOSIS — Z0001 Encounter for general adult medical examination with abnormal findings: Secondary | ICD-10-CM | POA: Insufficient documentation

## 2017-12-11 DIAGNOSIS — E119 Type 2 diabetes mellitus without complications: Secondary | ICD-10-CM | POA: Diagnosis not present

## 2017-12-14 ENCOUNTER — Other Ambulatory Visit: Payer: Self-pay

## 2017-12-14 DIAGNOSIS — I10 Essential (primary) hypertension: Secondary | ICD-10-CM

## 2017-12-14 DIAGNOSIS — K219 Gastro-esophageal reflux disease without esophagitis: Secondary | ICD-10-CM

## 2017-12-14 MED ORDER — RANITIDINE HCL 150 MG PO TABS: 150.0000 mg | ORAL_TABLET | Freq: Two times a day (BID) | ORAL | 3 refills | Status: DC

## 2017-12-14 MED ORDER — METOPROLOL TARTRATE 100 MG PO TABS: 100.0000 mg | ORAL_TABLET | Freq: Two times a day (BID) | ORAL | 1 refills | Status: DC

## 2017-12-14 MED ORDER — GLIMEPIRIDE 4 MG PO TABS: 4.0000 mg | ORAL_TABLET | Freq: Every day | ORAL | 1 refills | Status: DC

## 2017-12-17 ENCOUNTER — Other Ambulatory Visit: Payer: Self-pay

## 2017-12-17 DIAGNOSIS — K219 Gastro-esophageal reflux disease without esophagitis: Secondary | ICD-10-CM

## 2017-12-17 MED ORDER — PANTOPRAZOLE SODIUM 40 MG PO TBEC: 40.0000 mg | DELAYED_RELEASE_TABLET | Freq: Every day | ORAL | 3 refills | Status: DC

## 2017-12-31 ENCOUNTER — Ambulatory Visit (INDEPENDENT_AMBULATORY_CARE_PROVIDER_SITE_OTHER): Payer: Medicare Other | Admitting: Nurse Practitioner

## 2017-12-31 ENCOUNTER — Encounter: Payer: Self-pay | Admitting: Nurse Practitioner

## 2017-12-31 VITALS — BP 134/90 | HR 98 | Resp 16 | Ht 69.0 in | Wt 256.6 lb

## 2017-12-31 DIAGNOSIS — E1165 Type 2 diabetes mellitus with hyperglycemia: Secondary | ICD-10-CM | POA: Diagnosis not present

## 2017-12-31 DIAGNOSIS — I10 Essential (primary) hypertension: Secondary | ICD-10-CM

## 2017-12-31 DIAGNOSIS — K921 Melena: Secondary | ICD-10-CM | POA: Diagnosis not present

## 2017-12-31 DIAGNOSIS — E11649 Type 2 diabetes mellitus with hypoglycemia without coma: Secondary | ICD-10-CM | POA: Diagnosis not present

## 2017-12-31 DIAGNOSIS — R197 Diarrhea, unspecified: Secondary | ICD-10-CM

## 2017-12-31 LAB — CBC, NO DIFFERENTIAL/PLATELET
Hematocrit: 41.5 % (ref 37.5–51.0)
Hemoglobin: 14.4 g/dL (ref 13.0–17.7)
MCH: 29.4 pg (ref 26.6–33.0)
MCHC: 34.7 g/dL (ref 31.5–35.7)
MCV: 85 fL (ref 79–97)
RBC: 4.9 x10E6/uL (ref 4.14–5.80)
RDW: 15.2 % (ref 12.3–15.4)
WBC: 8.9 10*3/uL (ref 3.4–10.8)

## 2017-12-31 LAB — POCT GLYCOSYLATED HEMOGLOBIN (HGB A1C): Hemoglobin A1C: 9.6 % — AB (ref 4.0–5.6)

## 2017-12-31 MED ORDER — BASAGLAR KWIKPEN 100 UNIT/ML ~~LOC~~ SOPN: PEN_INJECTOR | SUBCUTANEOUS | 5 refills | Status: DC

## 2017-12-31 NOTE — Progress Notes (Signed)
[facility] [place], [place] [address]  Internal MEDICINE  Office Visit Note  Patient Name: Raymond [name]  [id number]  [id number]  Date of Service: 12/31/2017  Chief Complaint  Patient presents with  . Diabetes    2 month follow up  . Blood In Stools    pt noticed a lot more blood than usual in stool the past week. it looks more like clots    The patient states that he has noticed blood in his stool most recently, he has noted large clots coming out when he has bowel movement. No cramping or abdominal pain present. When he does have episodes of diarrhea, the bleeding is worse. He did have colonoscopy in 2017. He had four polyps were removed and non were malignant. Her also had non-bleeding internal hemorrhoids.   Diabetes  He presents for his follow-up diabetic visit. He has type 2 diabetes mellitus. His disease course has been improving. Hypoglycemia symptoms include nervousness/anxiousness. Pertinent negatives for hypoglycemia include no dizziness, headaches or pallor. Associated symptoms include fatigue, polydipsia and polyuria. Pertinent negatives for diabetes include no chest pain and no weakness. There are no hypoglycemic complications. Risk factors for coronary artery disease include diabetes mellitus, dyslipidemia, family history, hypertension, male sex and tobacco exposure. Current diabetic treatment includes insulin injections. He is compliant with treatment most of the time. His weight is stable. He is following a generally healthy diet. Meal planning includes avoidance of concentrated sweets. He has had a previous visit with a dietitian. He participates in exercise intermittently. His home blood glucose trend is decreasing steadily. An ACE inhibitor/angiotensin II receptor blocker is not being taken. He sees a podiatrist.Eye exam is current.       Current Medication: Outpatient Encounter Medications as of 12/31/2017  Medication Sig Note  . ACCU-CHEK  FASTCLIX LANCETS MISC yse as directed twice a day   . amLODipine (NORVASC) 5 MG tablet Take 5 mg by mouth daily.   Marland Kitchen aspirin EC 81 MG tablet Take 81 mg by mouth.   Marland Kitchen atorvastatin (LIPITOR) 80 MG tablet Take by mouth.   . fluticasone (FLONASE) 50 MCG/ACT nasal spray 2 sprays by Each Nare route daily. 07/06/2017: Ran out  and needs new prescription  . gabapentin (NEURONTIN) 100 MG capsule Take by mouth.   Marland Kitchen glimepiride (AMARYL) 4 MG tablet Take 1 tablet (4 mg total) by mouth daily with breakfast.   . glucose blood (ACCU-CHEK AVIVA PLUS) test strip Blood sugar testing TID and as needed. E11.65   . hydrochlorothiazide (HYDRODIURIL) 25 MG tablet Take 25 mg by mouth daily.  05/30/2017: Patient reports taking but no recent fills per pharmacy records   . hydrOXYzine (VISTARIL) 50 MG capsule Take 50 mg by mouth 2 (two) times daily as needed.   . insulin aspart (NOVOLOG FLEXPEN) 100 UNIT/ML FlexPen Use as directed per sliding scale. Instructions provided.   . Insulin Glargine (BASAGLAR KWIKPEN) 100 UNIT/ML SOPN Inject up to 60 units [place] QD   . metoprolol succinate (TOPROL-XL) 100 MG 24 hr tablet Take by mouth.   . metoprolol tartrate (LOPRESSOR) 100 MG tablet Take 1 tablet (100 mg total) by mouth 2 (two) times daily.   . mirtazapine (REMERON) 45 MG tablet Take 45 mg by mouth at bedtime.   . nitroGLYCERIN (NITROSTAT) 0.4 MG SL tablet Place 0.4 mg under the tongue every 5 (five) minutes as needed for chest pain.   Marland Kitchen omeprazole (PRILOSEC) 40 MG capsule Take by mouth.   Marland Kitchen  oxyCODONE-acetaminophen (PERCOCET) 5-325 MG tablet Take 1 tablet by mouth every 4 (four) hours as needed for moderate pain or severe pain.   . pantoprazole (PROTONIX) 40 MG tablet Take 1 tablet (40 mg total) by mouth daily.   . QUEtiapine (SEROQUEL XR) 200 MG 24 hr tablet Take 200 mg by mouth at bedtime.    . ranitidine (ZANTAC) 150 MG tablet Take 1 tablet (150 mg total) by mouth 2 (two) times daily.   . simvastatin (ZOCOR) 40 MG tablet Take 40  mg by mouth at bedtime.   . sitaGLIPtin-metformin (JANUMET) 50-500 MG tablet Take 1 tablet by mouth 2 (two) times daily.   . traZODone (DESYREL) 100 MG tablet Take 300 mg by mouth at bedtime.   . [DISCONTINUED] hydrOXYzine (ATARAX/VISTARIL) 50 MG tablet    . [DISCONTINUED] Insulin Glargine (BASAGLAR KWIKPEN) 100 UNIT/ML SOPN Inject up to 36 units [place] QD    No facility-administered encounter medications on file as of 12/31/2017.     Surgical History: Past Surgical History:  Procedure Laterality Date  .  ingrown toenail removal    . CLAVICLE SURGERY Right   . SHOULDER SURGERY Right     Medical History: Past Medical History:  Diagnosis Date  . Anxiety   . Depression   . Diabetes mellitus without complication ([place])   . GERD (gastroesophageal reflux disease)   . Headache   . Hyperlipidemia   . Hypertension   . Irregular heart beat unk  . Myocardial infarction ([place])   . PTSD (post-traumatic stress disorder)   . Tachycardia     Family History: Family History  Problem Relation Age of Onset  . Hypertension Mother   . Diabetes type II Mother   . Diabetes Mother   . COPD Father   . Cancer Father   . Heart disease Father   . Diabetes Sister   . Diabetes Brother   . Diabetes Maternal Aunt   . Diabetes Sister   . Diabetes Sister   . Diabetes Sister     Social History   Socioeconomic History  . Marital status: Married    Spouse name: Not on file  . Number of children: Not on file  . Years of education: Not on file  . Highest education level: Not on file  Occupational History  . Not on file  Social Needs  . Financial resource strain: Not on file  . Food insecurity:    Worry: Not on file    Inability: Not on file  . Transportation needs:    Medical: Not on file    Non-medical: Not on file  Tobacco Use  . Smoking status: Current Every Day Smoker    Packs/day: 1.50    Years: 40.00    Pack years: 60.00    Types: Cigarettes  . Smokeless tobacco: Former Systems developer     Types: Chew  Substance and Sexual Activity  . Alcohol use: No    Alcohol/week: 0.0 standard drinks    Frequency: Never  . Drug use: No  . Sexual activity: Not on file  Lifestyle  . Physical activity:    Days per week: Not on file    Minutes per session: Not on file  . Stress: Not on file  Relationships  . Social connections:    Talks on phone: Not on file    Gets together: Not on file    Attends religious service: Not on file    Active member of club or organization: Not on file  Attends meetings of clubs or organizations: Not on file    Relationship status: Not on file  . Intimate partner violence:    Fear of current or ex partner: Not on file    Emotionally abused: Not on file    Physically abused: Not on file    Forced sexual activity: Not on file  Other Topics Concern  . Not on file  Social History Narrative  . Not on file      Review of Systems  Constitutional: Positive for fatigue. Negative for activity change, appetite change and unexpected weight change.  HENT: Negative for congestion, postnasal drip, sore throat and voice change.   Eyes: Negative.   Respiratory: Negative for cough, chest tightness and wheezing.   Cardiovascular: Negative for chest pain, palpitations and leg swelling.       Blood pressure improved today.  Gastrointestinal: Positive for abdominal distention, abdominal pain and diarrhea. Negative for nausea and vomiting.       Has noted blood in his stool. Over past few weeks, he has seen large, dark colored clots of blood in the stool. Few episodes of diarrhea, but no cramping or abdominal pain.   Endocrine: Positive for polydipsia and polyuria.       States he is checking his blood sugars more consistently. Now running in high 100s to mid 200s. Is taking basaglar 50units daily and using sliding scale insulin for mealtime coverage.   Genitourinary: Negative for dysuria, flank pain, frequency, hematuria and testicular pain.  Musculoskeletal:  Negative for arthralgias, back pain and myalgias.  Skin: Negative for color change, pallor, rash and wound.  Allergic/Immunologic: Positive for environmental allergies.  Neurological: Negative for dizziness, weakness and headaches.  Hematological: Negative for adenopathy.  Psychiatric/Behavioral: Negative for behavioral problems, dysphoric mood and suicidal ideas. The patient is nervous/anxious.     Vital Signs: BP 134/90 (BP Location: Right Arm, Patient Position: Sitting, Cuff Size: Large)   Pulse 98   Resp 16   Ht 5\' 9"  (1.753 m)   Wt 256 lb 9.6 oz (116.4 kg)   SpO2 96%   BMI 37.89 kg/m    Physical Exam  Constitutional: He is oriented to person, place, and time. He appears well-developed and well-nourished. No distress.  HENT:  Head: Normocephalic and atraumatic.  Nose: Nose normal.  Mouth/Throat: Oropharynx is clear and moist. No oropharyngeal exudate.  Eyes: Pupils are equal, round, and reactive to light. Conjunctivae and EOM are normal.  Neck: Normal range of motion. Neck supple. No JVD present. Carotid bruit is not present. No tracheal deviation present. No thyromegaly present.  Cardiovascular: Normal rate, regular rhythm and normal heart sounds. Exam reveals no gallop and no friction rub.  No murmur heard. Pulmonary/Chest: Effort normal and breath sounds normal. No respiratory distress. He has no wheezes. He has no rales. He exhibits no tenderness.  Abdominal: Soft. Bowel sounds are normal. There is no tenderness.  Mild, generalized abdominal tenderness.  Musculoskeletal: Normal range of motion.  Lymphadenopathy:    He has no cervical adenopathy.  Neurological: He is alert and oriented to person, place, and time. No cranial nerve deficit.  Skin: Skin is warm and dry. Capillary refill takes 2 to 3 seconds. He is not diaphoretic.  Psychiatric: He has a normal mood and affect. His behavior is normal. Judgment and thought content normal.  Nursing note and vitals  reviewed.  Assessment/Plan: 1. Uncontrolled type 2 diabetes mellitus with hyperglycemia (HCC) - POCT HgB A1C 9.6 today, improved from 13.4 at his  last check. Will gradually increase Basaglar to 60 units daily. Continue to use sliding scale insulin as needed and as indicated.   2. Clotted blood in stool Check stool studies. Will get imaging and refer to GI as indicated based on lab results.  - Cdiff NAA+O+P+Stool Culture - Fecal occult blood, imunochemical([facility]/Sunquest) - CBC, No Differential/Platelet  3. Diarrhea, unspecified type Stool studies ordered.  - Cdiff NAA+O+P+Stool Culture  4. Essential hypertension Improved. Continue bp medication as prescribed.    General Counseling: Quadry verbalizes understanding of the findings of todays visit and agrees with plan of treatment. I have discussed any further diagnostic evaluation that may be needed or ordered today. We also reviewed his medications today. he has been encouraged to call the office with any questions or concerns that should arise related to todays visit.  Diabetes Counseling:  1. Addition of ACE inh/ ARB'S for nephroprotection. Microalbumin is updated  2. Diabetic foot care, prevention of complications. Podiatry consult 3. Exercise and lose weight.  4. Diabetic eye examination, Diabetic eye exam is updated  5. Monitor blood sugar closlely. nutrition counseling.  6. Sign and symptoms of hypoglycemia including shaking sweating,confusion and headaches.  This patient was seen by Leretha Pol FNP Collaboration with Dr Lavera Guise as a part of collaborative care agreement  Orders Placed This Encounter  Procedures  . Cdiff NAA+O+P+Stool Culture  . Fecal occult blood, imunochemical([facility]/Sunquest)  . CBC, No Differential/Platelet  . POCT HgB A1C    Meds ordered this encounter  Medications  . Insulin Glargine (BASAGLAR KWIKPEN) 100 UNIT/ML SOPN    Sig: Inject up to 60 units [place] QD    Dispense:  5 pen     Refill:  5    Update prescription    Order Specific Question:   Supervising Provider    Answer:   Lavera Guise [[id number]]    Time spent: 32 Minutes      Dr Lavera Guise Internal medicine

## 2018-01-01 ENCOUNTER — Ambulatory Visit: Payer: Self-pay | Admitting: Nurse Practitioner

## 2018-01-03 DIAGNOSIS — K921 Melena: Secondary | ICD-10-CM | POA: Diagnosis not present

## 2018-01-03 DIAGNOSIS — R197 Diarrhea, unspecified: Secondary | ICD-10-CM | POA: Diagnosis not present

## 2018-01-04 LAB — FECAL OCCULT BLOOD, IMMUNOCHEMICAL: Fecal Occult Bld: NEGATIVE

## 2018-01-07 LAB — CDIFF NAA+O+P+STOOL CULTURE
CDIFFPCR: NEGATIVE
E coli, Shiga toxin Assay: NEGATIVE

## 2018-01-16 ENCOUNTER — Other Ambulatory Visit: Payer: Self-pay

## 2018-01-16 DIAGNOSIS — E1165 Type 2 diabetes mellitus with hyperglycemia: Secondary | ICD-10-CM

## 2018-01-16 MED ORDER — SITAGLIPTIN PHOS-METFORMIN HCL 50-500 MG PO TABS: 1.0000 | ORAL_TABLET | Freq: Two times a day (BID) | ORAL | 1 refills | Status: DC

## 2018-02-01 ENCOUNTER — Other Ambulatory Visit: Payer: Self-pay

## 2018-03-01 ENCOUNTER — Emergency Department
Admission: EM | Admit: 2018-03-01 | Discharge: 2018-03-01 | Disposition: A | Discharge status: Home / Self Care | Payer: Medicare Other | Attending: Emergency Medicine | Admitting: Emergency Medicine

## 2018-03-01 ENCOUNTER — Emergency Department: Payer: Medicare Other

## 2018-03-01 DIAGNOSIS — R0602 Shortness of breath: Secondary | ICD-10-CM | POA: Diagnosis not present

## 2018-03-01 DIAGNOSIS — Z79899 Other long term (current) drug therapy: Secondary | ICD-10-CM | POA: Diagnosis not present

## 2018-03-01 DIAGNOSIS — E119 Type 2 diabetes mellitus without complications: Secondary | ICD-10-CM | POA: Diagnosis not present

## 2018-03-01 DIAGNOSIS — J40 Bronchitis, not specified as acute or chronic: Secondary | ICD-10-CM | POA: Diagnosis not present

## 2018-03-01 DIAGNOSIS — F17210 Nicotine dependence, cigarettes, uncomplicated: Secondary | ICD-10-CM | POA: Diagnosis not present

## 2018-03-01 DIAGNOSIS — Z7982 Long term (current) use of aspirin: Secondary | ICD-10-CM | POA: Insufficient documentation

## 2018-03-01 DIAGNOSIS — I10 Essential (primary) hypertension: Secondary | ICD-10-CM | POA: Insufficient documentation

## 2018-03-01 DIAGNOSIS — Z794 Long term (current) use of insulin: Secondary | ICD-10-CM | POA: Insufficient documentation

## 2018-03-01 LAB — CBC
HCT: 42.3 % (ref 39.0–52.0)
Hemoglobin: 14.6 g/dL (ref 13.0–17.0)
MCH: 28.1 pg (ref 26.0–34.0)
MCHC: 34.5 g/dL (ref 30.0–36.0)
MCV: 81.5 fL (ref 80.0–100.0)
Platelets: 221 10*3/uL (ref 150–400)
RBC: 5.19 MIL/uL (ref 4.22–5.81)
RDW: 12.6 % (ref 11.5–15.5)
WBC: 7.9 10*3/uL (ref 4.0–10.5)
nRBC: 0 % (ref 0.0–0.2)

## 2018-03-01 LAB — INFLUENZA PANEL BY PCR (TYPE A & B)
Influenza A By PCR: NEGATIVE
Influenza B By PCR: NEGATIVE

## 2018-03-01 LAB — GLUCOSE, CAPILLARY: GLUCOSE-CAPILLARY: 282 mg/dL — AB (ref 70–99)

## 2018-03-01 LAB — BASIC METABOLIC PANEL
Anion gap: 12 (ref 5–15)
BUN: 18 mg/dL (ref 6–20)
CHLORIDE: 98 mmol/L (ref 98–111)
CO2: 21 mmol/L — AB (ref 22–32)
CREATININE: 1.12 mg/dL (ref 0.61–1.24)
Calcium: 9.1 mg/dL (ref 8.9–10.3)
GFR calc Af Amer: >60 mL/min (ref >60)
GFR calc non Af Amer: >60 mL/min (ref >60)
GLUCOSE: 547 mg/dL — AB (ref 70–99)
Potassium: 4.5 mmol/L (ref 3.5–5.1)
Sodium: 131 mmol/L — ABNORMAL LOW (ref 135–145)

## 2018-03-01 MED ORDER — ALBUTEROL SULFATE (2.5 MG/3ML) 0.083% IN NEBU
2.5000 mg | INHALATION_SOLUTION | Freq: Once | RESPIRATORY_TRACT | Status: AC
  Administered 2018-03-01: 2.5 mg via RESPIRATORY_TRACT
  Filled 2018-03-01: qty 3

## 2018-03-01 MED ORDER — SODIUM CHLORIDE 0.9 % IV BOLUS
1000.0000 mL | Freq: Once | INTRAVENOUS | Status: AC
  Administered 2018-03-01: 1000 mL via INTRAVENOUS

## 2018-03-01 MED ORDER — ALBUTEROL SULFATE HFA 108 (90 BASE) MCG/ACT IN AERS: 2.0000 | INHALATION_SPRAY | RESPIRATORY_TRACT | 0 refills | Status: DC | PRN

## 2018-03-01 MED ORDER — GUAIFENESIN 200 MG PO TABS: 400.0000 mg | ORAL_TABLET | Freq: Four times a day (QID) | ORAL | 0 refills | Status: DC | PRN

## 2018-03-01 NOTE — ED Triage Notes (Signed)
Pt is current smoker and is SOB since yesterday. Pt is NAd and WNL.

## 2018-03-01 NOTE — ED Notes (Signed)
Dr. Burlene Arnt informed of critical glucose level. Verbal order given for 1 liter NS bolus.

## 2018-03-01 NOTE — ED Notes (Signed)
Pt c/o sudden onset of shortness of breath PTA. Denies sob at this time.

## 2018-03-01 NOTE — ED Provider Notes (Signed)
[facility] Emergency Department Provider Note ____________________________________________   First MD Initiated Contact with Patient 03/01/18 1616     (approximate)  I have reviewed the triage vital signs and the nursing notes.   HISTORY  Chief Complaint Shortness of Breath    HPI Raymond [name] is a 52 y.o. male with PMH as noted below including diabetes on insulin who presents with shortness of breath, acute onset yesterday, worse during the night, and occurring episodically especially when he would lie flat or relax.  It is associated with a nonproductive cough.  He states he felt similar once when he had the flu.  He denies chest pain, vomiting, or fatigue.  The patient states that he did not take his insulin today because he did not get breakfast but states that his sugars have otherwise been stable.   Past Medical History:  Diagnosis Date  . Anxiety   . Depression   . Diabetes mellitus without complication ([place])   . GERD (gastroesophageal reflux disease)   . Headache   . Hyperlipidemia   . Hypertension   . Irregular heart beat unk  . Myocardial infarction ([place])   . PTSD (post-traumatic stress disorder)   . Tachycardia     Patient Active Problem List   Diagnosis Date Noted  . Clotted blood in stool 12/31/2017  . Diarrhea 12/31/2017  . Encounter for general adult medical examination with abnormal findings 10/27/2017  . Uncontrolled type 2 diabetes mellitus with hyperglycemia ([place]) 10/27/2017  . Onychomycosis with ingrown toenail 10/27/2017  . Dysuria 10/27/2017  . Gastroesophageal reflux disease with esophagitis 09/12/2017  . Uncontrolled type 2 diabetes mellitus with hypoglycemia without coma ([place]) 07/08/2017  . Shortness of breath 07/08/2017  . Cigarette smoker 07/08/2017  . Influenza A 05/30/2017  . Obstructive sleep apnea syndrome 08/12/2015  . Cervical spondylosis 06/14/2015  . Essential hypertension 04/08/2015  . Depression  04/08/2015  . Gastroesophageal reflux disease without esophagitis 01/05/2015  . 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  ingrown toenail removal    . CLAVICLE SURGERY Right   . SHOULDER SURGERY Right     Prior to Admission medications   Medication Sig Start Date End Date Taking? Authorizing Provider  ACCU-CHEK FASTCLIX LANCETS MISC yse as directed twice a day 05/07/17   Ronnell Freshwater, NP  albuterol (PROVENTIL HFA;VENTOLIN HFA) 108 (90 Base) MCG/ACT inhaler Inhale 2 puffs into the lungs every 4 (four) hours as needed for wheezing or shortness of breath. 03/01/18   Arta Silence, MD  amLODipine (NORVASC) 5 MG tablet Take 5 mg by mouth daily.    [provider]  aspirin EC 81 MG tablet Take 81 mg by mouth. 01/12/16   [provider]  atorvastatin (LIPITOR) 80 MG tablet Take by mouth. 01/12/16   [provider]  fluticasone (FLONASE) 50 MCG/ACT nasal spray 2 sprays by Each Nare route daily. 05/16/16   [provider]  gabapentin (NEURONTIN) 100 MG capsule Take by mouth. 03/13/14   [provider]  glimepiride (AMARYL) 4 MG tablet Take 1 tablet (4 mg total) by mouth daily with breakfast. 12/14/17   Ronnell Freshwater, NP  glucose blood (ACCU-CHEK AVIVA PLUS) test strip Blood sugar testing TID and as needed. E11.65 10/05/17   Ronnell Freshwater, NP  guaiFENesin 200 MG tablet Take 2 tablets (400 mg total) by mouth every 6 (six) hours as needed for cough or  to loosen phlegm. 03/01/18   Arta Silence, MD  hydrochlorothiazide (HYDRODIURIL) 25 MG tablet Take 25 mg by mouth daily.     [provider]  hydrOXYzine (VISTARIL) 50 MG capsule Take 50 mg by mouth 2 (two) times daily as needed.    [provider]  insulin aspart (NOVOLOG FLEXPEN) 100 UNIT/ML FlexPen Use as directed per sliding scale. Instructions provided. 10/05/17   Ronnell Freshwater, NP  Insulin  Glargine (BASAGLAR KWIKPEN) 100 UNIT/ML SOPN Inject up to 60 units [place] QD 12/31/17   Ronnell Freshwater, NP  metoprolol succinate (TOPROL-XL) 100 MG 24 hr tablet Take by mouth. 01/13/16   [provider]  metoprolol tartrate (LOPRESSOR) 100 MG tablet Take 1 tablet (100 mg total) by mouth 2 (two) times daily. 12/14/17   Ronnell Freshwater, NP  mirtazapine (REMERON) 45 MG tablet Take 45 mg by mouth at bedtime.    [provider]  nitroGLYCERIN (NITROSTAT) 0.4 MG SL tablet Place 0.4 mg under the tongue every 5 (five) minutes as needed for chest pain.    [provider]  omeprazole (PRILOSEC) 40 MG capsule Take by mouth. 05/16/16   [provider]  oxyCODONE-acetaminophen (PERCOCET) 5-325 MG tablet Take 1 tablet by mouth every 4 (four) hours as needed for moderate pain or severe pain. 07/22/17 07/22/18  Orbie Pyo, MD  pantoprazole (PROTONIX) 40 MG tablet Take 1 tablet (40 mg total) by mouth daily. 12/17/17   Ronnell Freshwater, NP  QUEtiapine (SEROQUEL XR) 200 MG 24 hr tablet Take 200 mg by mouth at bedtime.     [provider]  ranitidine (ZANTAC) 150 MG tablet Take 1 tablet (150 mg total) by mouth 2 (two) times daily. 12/14/17   Ronnell Freshwater, NP  simvastatin (ZOCOR) 40 MG tablet Take 40 mg by mouth at bedtime.    [provider]  sitaGLIPtin-metformin (JANUMET) 50-500 MG tablet Take 1 tablet by mouth 2 (two) times daily. 01/16/18   Ronnell Freshwater, NP  traZODone (DESYREL) 100 MG tablet Take 300 mg by mouth at bedtime.    [provider]    Allergies Onion; Hydrocodone; Norco [hydrocodone-acetaminophen]; and Hydrocodone-acetaminophen  Family History  Problem Relation Age of Onset  . Hypertension Mother   . Diabetes type II Mother   . Diabetes Mother   . COPD Father   . Cancer Father   . Heart disease Father   . Diabetes Sister   . Diabetes Brother   . Diabetes Maternal Aunt   . Diabetes Sister   . Diabetes Sister   .  Diabetes Sister     Social History Social History   Tobacco Use  . Smoking status: Current Every Day Smoker    Packs/day: 1.50    Years: 40.00    Pack years: 60.00    Types: Cigarettes  . Smokeless tobacco: Former Systems developer    Types: Chew  Substance Use Topics  . Alcohol use: No    Alcohol/week: 0.0 standard drinks    Frequency: Never  . Drug use: No    Review of Systems  Constitutional: No fever. Eyes: No visual changes. ENT: No sore throat. Cardiovascular: Denies chest pain. Respiratory: Positive for shortness of breath. Gastrointestinal: No nausea or vomiting.  Positive for diarrhea. Genitourinary: Negative for dysuria.  Musculoskeletal: Negative for back pain. Skin: Negative for rash. Neurological: Negative for headaches.   ____________________________________________   PHYSICAL EXAM:  VITAL SIGNS: ED Triage Vitals  Enc Vitals Group  BP 03/01/18 1237 (!) 177/99     Pulse Rate 03/01/18 1237 89     Resp 03/01/18 1237 15     Temp 03/01/18 1237 98.4 F (36.9 C)     Temp Source 03/01/18 1237 Oral     SpO2 03/01/18 1237 96 %     Weight 03/01/18 1242 250 lb (113.4 kg)     Height 03/01/18 1242 5\' 9"  (1.753 m)     Head Circumference --      Peak Flow --      Pain Score --      Pain Loc --      Pain Edu? --      Excl. in [place]? --     Constitutional: Alert and oriented.  Relatively well appearing and in no acute distress. Eyes: Conjunctivae are normal.  Head: Atraumatic. Nose: No congestion/rhinnorhea. Mouth/Throat: Mucous membranes are slightly dry.   Neck: Normal range of motion.  Cardiovascular: Normal rate, regular rhythm. Grossly normal heart sounds.  Good peripheral circulation. Respiratory: Normal respiratory effort.  No retractions. Lungs CTAB. Gastrointestinal: No distention.  Musculoskeletal: No lower extremity edema.  Extremities warm and well perfused.  Neurologic:  Normal speech and language. No gross focal neurologic deficits are appreciated.    Skin:  Skin is warm and dry. No rash noted. Psychiatric: Mood and affect are normal. Speech and behavior are normal.  ____________________________________________   LABS (all labs ordered are listed, but only abnormal results are displayed)  Labs Reviewed  BASIC METABOLIC PANEL - Abnormal; Notable for the following components:      Result Value   Sodium 131 (*)    CO2 21 (*)    Glucose, Bld 547 (*)    All other components within normal limits  GLUCOSE, CAPILLARY - Abnormal; Notable for the following components:   Glucose-Capillary 282 (*)    All other components within normal limits  CBC  INFLUENZA PANEL BY PCR (TYPE A & B)  CBG MONITORING, ED   ____________________________________________  EKG  ED ECG REPORT I, Arta Silence, the attending physician, personally viewed and interpreted this ECG.  Date: 03/01/2018 EKG Time: 1235 Rate: 83 Rhythm: normal sinus rhythm QRS Axis: normal Intervals: normal ST/T Wave abnormalities: normal Narrative Interpretation: no evidence of acute ischemia  ____________________________________________  RADIOLOGY  CXR: No focal infiltrate or other acute abnormality  ____________________________________________   PROCEDURES  Procedure(s) performed: No  Procedures  Critical Care performed: No ____________________________________________   INITIAL IMPRESSION / ASSESSMENT AND PLAN / ED COURSE  Pertinent labs & imaging results that were available during my care of the patient were reviewed by me and considered in my medical decision making (see chart for details).  52 year old male with history of diabetes presents with shortness of breath and cough since yesterday.  He also reports not taking his insulin this morning because he did not eat breakfast.  On exam the patient is overall well-appearing and his vital signs are normal except for hypertension.  His lungs are clear on exam.  The remainder of the exam is as described  above.  Initial labs obtained from triage reveals significant hyperglycemia with no evidence of DKA.  His chest x-ray is clear.  Overall I suspect most likely viral URI/bronchitis.  I will obtain a flu swab, give a nebulizer treatment, and recheck glucose since he has gotten fluids.  ----------------------------------------- 6:08 PM on 03/01/2018 -----------------------------------------  The glucose is significantly improved after fluids.  The flu is negative.  The patient is feeling well  after the nebulizer treatment.  His O2 saturation remains in the mid to high 90s on room air and he has no active shortness of breath or respiratory distress.  I counseled the patient on the results of the work-up.  I suspect bronchitis or viral URI as the most likely causes.  I will prescribe the patient an albuterol inhaler as well as some guaifenesin for cough.  I gave the patient thorough return precautions and he expressed understanding. ____________________________________________   FINAL CLINICAL IMPRESSION(S) / ED DIAGNOSES  Final diagnoses:  SOB (shortness of breath)  Bronchitis      NEW MEDICATIONS STARTED DURING THIS VISIT:  New Prescriptions   ALBUTEROL (PROVENTIL HFA;VENTOLIN HFA) 108 (90 BASE) MCG/ACT INHALER    Inhale 2 puffs into the lungs every 4 (four) hours as needed for wheezing or shortness of breath.   GUAIFENESIN 200 MG TABLET    Take 2 tablets (400 mg total) by mouth every 6 (six) hours as needed for cough or to loosen phlegm.     Note:  This document was prepared using Dragon voice recognition software and may include unintentional dictation errors.    Arta Silence, MD 03/01/18 [phone]

## 2018-03-01 NOTE — Discharge Instructions (Addendum)
Use the inhaler every 4-6 hours over the next several days, and the guaifenesin to help with cough.  Return to the ER for new, worsening, persistent severe shortness of breath, weakness or lightheadedness, chest pain, or any other new or worsening symptoms that concern you.

## 2018-03-15 ENCOUNTER — Other Ambulatory Visit: Payer: Self-pay

## 2018-03-15 DIAGNOSIS — K219 Gastro-esophageal reflux disease without esophagitis: Secondary | ICD-10-CM

## 2018-03-15 MED ORDER — PANTOPRAZOLE SODIUM 40 MG PO TBEC: 40.0000 mg | DELAYED_RELEASE_TABLET | Freq: Every day | ORAL | 3 refills | Status: DC

## 2018-04-01 ENCOUNTER — Ambulatory Visit (INDEPENDENT_AMBULATORY_CARE_PROVIDER_SITE_OTHER): Payer: Medicare Other | Admitting: Nurse Practitioner

## 2018-04-01 ENCOUNTER — Encounter: Payer: Self-pay | Admitting: Nurse Practitioner

## 2018-04-01 VITALS — BP 146/103 | HR 99 | Resp 16 | Ht 69.0 in | Wt 247.6 lb

## 2018-04-01 DIAGNOSIS — E1165 Type 2 diabetes mellitus with hyperglycemia: Secondary | ICD-10-CM | POA: Diagnosis not present

## 2018-04-01 DIAGNOSIS — K219 Gastro-esophageal reflux disease without esophagitis: Secondary | ICD-10-CM

## 2018-04-01 DIAGNOSIS — K921 Melena: Secondary | ICD-10-CM

## 2018-04-01 DIAGNOSIS — R0602 Shortness of breath: Secondary | ICD-10-CM

## 2018-04-01 DIAGNOSIS — F411 Generalized anxiety disorder: Secondary | ICD-10-CM

## 2018-04-01 DIAGNOSIS — R197 Diarrhea, unspecified: Secondary | ICD-10-CM | POA: Diagnosis not present

## 2018-04-01 DIAGNOSIS — F419 Anxiety disorder, unspecified: Secondary | ICD-10-CM | POA: Insufficient documentation

## 2018-04-01 DIAGNOSIS — I10 Essential (primary) hypertension: Secondary | ICD-10-CM

## 2018-04-01 LAB — POCT GLYCOSYLATED HEMOGLOBIN (HGB A1C): Hemoglobin A1C: 14.2 % — AB (ref 4.0–5.6)

## 2018-04-01 MED ORDER — SITAGLIPTIN PHOS-METFORMIN HCL 50-500 MG PO TABS: 1.0000 | ORAL_TABLET | Freq: Two times a day (BID) | ORAL | 1 refills | Status: DC

## 2018-04-01 MED ORDER — TRAZODONE HCL 100 MG PO TABS: 300.0000 mg | ORAL_TABLET | Freq: Every day | ORAL | 1 refills | Status: DC

## 2018-04-01 MED ORDER — FAMOTIDINE 20 MG PO TABS: 20.0000 mg | ORAL_TABLET | Freq: Two times a day (BID) | ORAL | 3 refills | Status: DC

## 2018-04-01 MED ORDER — ALBUTEROL SULFATE HFA 108 (90 BASE) MCG/ACT IN AERS: 2.0000 | INHALATION_SPRAY | RESPIRATORY_TRACT | 3 refills | Status: DC | PRN

## 2018-04-01 MED ORDER — QUETIAPINE FUMARATE ER 200 MG PO TB24: 200.0000 mg | ORAL_TABLET | Freq: Every day | ORAL | 1 refills | Status: DC

## 2018-04-01 MED ORDER — MIRTAZAPINE 45 MG PO TABS: 45.0000 mg | ORAL_TABLET | Freq: Every day | ORAL | 1 refills | Status: DC

## 2018-04-01 NOTE — Progress Notes (Signed)
[facility] [place], [place] [address]  Internal MEDICINE  Office Visit Note  Patient Name: Raymond [name]  [id number]  [id number]  Date of Service: 04/01/2018  Chief Complaint  Patient presents with  . Medical Management of Chronic Issues    3 month follow up, pt is concerned that he is still having blood in the stool.   . Diabetes  . Labs Only  . Anxiety  . Sleep Apnea    pt is concerned about anxiety and sleeping. He has not been sleeping at all  . Quality Metric Gaps    pt is undecided about getting the flu vaccine    The patient is here for routine follow up visit. He continues to see a lot of blood in the stool. Passing many clots. Happening nearly every time he has a bowel movement. He did have stool sample analyzed for blood and bacteria. All were negative. Symptoms continue to worsen.   States that his acid reflux is also worsening. His rantidine has been discontinued and he does not have an alternative.                 Blood sugars have become much higher since her was last seen, in fact, sugars are higher than they have every been. States that he is taking his medications as prescribed. Currently taking 52units of basaglar and using sliding scale insulin once or twice daily. Marland Kitchen Continues to take Janumet and amaryl as prescribed.  States that his psychiatric provider no longer takes his health insurance. He has been out of his medications to treat anxiety and insomnia for over a month. Needs to have refills ,but also needs referral to new psychiatric provider.              Current Medication: Outpatient Encounter Medications as of 04/01/2018  Medication Sig Note  . ACCU-CHEK FASTCLIX LANCETS MISC yse as directed twice a day   . albuterol (PROVENTIL HFA;VENTOLIN HFA) 108 (90 Base) MCG/ACT inhaler Inhale 2 puffs into the lungs every 4 (four) hours as needed for wheezing or shortness of breath.   Marland Kitchen amLODipine (NORVASC) 5 MG tablet Take 5 mg by  mouth daily.   Marland Kitchen aspirin EC 81 MG tablet Take 81 mg by mouth.   Marland Kitchen atorvastatin (LIPITOR) 80 MG tablet Take by mouth.   . fluticasone (FLONASE) 50 MCG/ACT nasal spray 2 sprays by Each Nare route daily. 07/06/2017: Ran out  and needs new prescription  . gabapentin (NEURONTIN) 100 MG capsule Take by mouth.   Marland Kitchen glimepiride (AMARYL) 4 MG tablet Take 1 tablet (4 mg total) by mouth daily with breakfast.   . glucose blood (ACCU-CHEK AVIVA PLUS) test strip Blood sugar testing TID and as needed. E11.65   . guaiFENesin 200 MG tablet Take 2 tablets (400 mg total) by mouth every 6 (six) hours as needed for cough or to loosen phlegm.   . hydrochlorothiazide (HYDRODIURIL) 25 MG tablet Take 25 mg by mouth daily.  05/30/2017: Patient reports taking but no recent fills per pharmacy records   . hydrOXYzine (VISTARIL) 50 MG capsule Take 50 mg by mouth 2 (two) times daily as needed.   . insulin aspart (NOVOLOG FLEXPEN) 100 UNIT/ML FlexPen Use as directed per sliding scale. Instructions provided.   . Insulin Glargine (BASAGLAR KWIKPEN) 100 UNIT/ML SOPN Inject up to 60 units [place] QD   . metoprolol succinate (TOPROL-XL) 100 MG 24 hr tablet Take by mouth.   . metoprolol tartrate (LOPRESSOR)  100 MG tablet Take 1 tablet (100 mg total) by mouth 2 (two) times daily.   . mirtazapine (REMERON) 45 MG tablet Take 1 tablet (45 mg total) by mouth at bedtime.   . nitroGLYCERIN (NITROSTAT) 0.4 MG SL tablet Place 0.4 mg under the tongue every 5 (five) minutes as needed for chest pain.   Marland Kitchen omeprazole (PRILOSEC) 40 MG capsule Take by mouth.   . oxyCODONE-acetaminophen (PERCOCET) 5-325 MG tablet Take 1 tablet by mouth every 4 (four) hours as needed for moderate pain or severe pain.   . pantoprazole (PROTONIX) 40 MG tablet Take 1 tablet (40 mg total) by mouth daily.   . QUEtiapine (SEROQUEL XR) 200 MG 24 hr tablet Take 1 tablet (200 mg total) by mouth at bedtime.   . simvastatin (ZOCOR) 40 MG tablet Take 40 mg by mouth at bedtime.   .  sitaGLIPtin-metformin (JANUMET) 50-500 MG tablet Take 1 tablet by mouth 2 (two) times daily.   . traZODone (DESYREL) 100 MG tablet Take 3 tablets (300 mg total) by mouth at bedtime.   . [DISCONTINUED] albuterol (PROVENTIL HFA;VENTOLIN HFA) 108 (90 Base) MCG/ACT inhaler Inhale 2 puffs into the lungs every 4 (four) hours as needed for wheezing or shortness of breath.   . [DISCONTINUED] mirtazapine (REMERON) 45 MG tablet Take 45 mg by mouth at bedtime.   . [DISCONTINUED] QUEtiapine (SEROQUEL XR) 200 MG 24 hr tablet Take 200 mg by mouth at bedtime.    . [DISCONTINUED] ranitidine (ZANTAC) 150 MG tablet Take 1 tablet (150 mg total) by mouth 2 (two) times daily.   . [DISCONTINUED] sitaGLIPtin-metformin (JANUMET) 50-500 MG tablet Take 1 tablet by mouth 2 (two) times daily.   . [DISCONTINUED] traZODone (DESYREL) 100 MG tablet Take 300 mg by mouth at bedtime.   . famotidine (PEPCID) 20 MG tablet Take 1 tablet (20 mg total) by mouth 2 (two) times daily.    No facility-administered encounter medications on file as of 04/01/2018.     Surgical History: Past Surgical History:  Procedure Laterality Date  .  ingrown toenail removal    . CLAVICLE SURGERY Right   . SHOULDER SURGERY Right     Medical History: Past Medical History:  Diagnosis Date  . Anxiety   . Depression   . Diabetes mellitus without complication ([place])   . GERD (gastroesophageal reflux disease)   . Headache   . Hyperlipidemia   . Hypertension   . Irregular heart beat unk  . Myocardial infarction ([place])   . PTSD (post-traumatic stress disorder)   . Tachycardia     Family History: Family History  Problem Relation Age of Onset  . Hypertension Mother   . Diabetes type II Mother   . Diabetes Mother   . COPD Father   . Cancer Father   . Heart disease Father   . Diabetes Sister   . Diabetes Brother   . Diabetes Maternal Aunt   . Diabetes Sister   . Diabetes Sister   . Diabetes Sister     Social History   Socioeconomic  History  . Marital status: Married    Spouse name: Not on file  . Number of children: Not on file  . Years of education: Not on file  . Highest education level: Not on file  Occupational History  . Not on file  Social Needs  . Financial resource strain: Not on file  . Food insecurity:    Worry: Not on file    Inability: Not on file  .  Transportation needs:    Medical: Not on file    Non-medical: Not on file  Tobacco Use  . Smoking status: Current Every Day Smoker    Packs/day: 1.50    Years: 40.00    Pack years: 60.00    Types: Cigarettes  . Smokeless tobacco: Former Systems developer    Types: Chew  Substance and Sexual Activity  . Alcohol use: No    Alcohol/week: 0.0 standard drinks    Frequency: Never  . Drug use: No  . Sexual activity: Not on file  Lifestyle  . Physical activity:    Days per week: Not on file    Minutes per session: Not on file  . Stress: Not on file  Relationships  . Social connections:    Talks on phone: Not on file    Gets together: Not on file    Attends religious service: Not on file    Active member of club or organization: Not on file    Attends meetings of clubs or organizations: Not on file    Relationship status: Not on file  . Intimate partner violence:    Fear of current or ex partner: Not on file    Emotionally abused: Not on file    Physically abused: Not on file    Forced sexual activity: Not on file  Other Topics Concern  . Not on file  Social History Narrative  . Not on file      Review of Systems  Constitutional: Positive for fatigue. Negative for activity change, appetite change and unexpected weight change.  HENT: Negative for congestion, postnasal drip, sore throat and voice change.   Respiratory: Positive for cough and shortness of breath. Negative for chest tightness and wheezing.        Was seen in ER recently and diagnosed with bronchitis. Received breathing treatment which helped. Was given rx for albuterol rescue inhaler  which has helped.  Cardiovascular: Negative for chest pain, palpitations and leg swelling.       Blood pressure improved today.  Gastrointestinal: Positive for abdominal distention, abdominal pain, blood in stool and diarrhea. Negative for nausea and vomiting.       Has noted blood in his stool. Over past few weeks, he has seen large, dark colored clots of blood in the stool. Few episodes of diarrhea, but no cramping or abdominal pain. States that symptoms have been worsening.   Endocrine: Positive for polydipsia and polyuria.       Blood sugars have started running very high again. States that he has been able to titrate dose of basal insulin up to 52units every day and will use sliding scale insulin for some meals.   Genitourinary: Negative for dysuria, flank pain, frequency, hematuria and testicular pain.  Musculoskeletal: Negative for arthralgias, back pain and myalgias.  Skin: Negative for color change, pallor, rash and wound.  Allergic/Immunologic: Positive for environmental allergies.  Neurological: Negative for dizziness, weakness and headaches.  Hematological: Negative for adenopathy.  Psychiatric/Behavioral: Positive for sleep disturbance. Negative for behavioral problems, dysphoric mood and suicidal ideas. The patient is nervous/anxious.     Today's Vitals   04/01/18 1008  BP: (!) 146/103  Pulse: 99  Resp: 16  SpO2: 95%  Weight: 247 lb 9.6 oz (112.3 kg)  Height: 5\' 9"  (1.753 m)    Physical Exam  Constitutional: He is oriented to person, place, and time. He appears well-developed and well-nourished. No distress.  HENT:  Head: Normocephalic and atraumatic.  Nose: Nose normal.  Mouth/Throat: No oropharyngeal exudate.  Eyes: Pupils are equal, round, and reactive to light. Conjunctivae and EOM are normal.  Neck: Normal range of motion. Neck supple. No JVD present. Carotid bruit is not present. No tracheal deviation present. No thyromegaly present.  Cardiovascular: Normal rate,  regular rhythm and normal heart sounds. Exam reveals no gallop and no friction rub.  No murmur heard. Pulmonary/Chest: Effort normal and breath sounds normal. No respiratory distress. He has no wheezes. He has no rales. He exhibits no tenderness.  Abdominal: Soft. Bowel sounds are normal. There is tenderness.  Mild, generalized abdominal tenderness.  Musculoskeletal: Normal range of motion.  Lymphadenopathy:    He has no cervical adenopathy.  Neurological: He is alert and oriented to person, place, and time. No cranial nerve deficit.  Skin: Skin is warm and dry. Capillary refill takes 2 to 3 seconds. He is not diaphoretic.  Psychiatric: His speech is normal and behavior is normal. Judgment and thought content normal. His mood appears anxious. Cognition and memory are normal. He exhibits a depressed mood.  Nursing note and vitals reviewed.  Assessment/Plan: 1. Uncontrolled type 2 diabetes mellitus with hyperglycemia (HCC) - POCT HgB A1C very uncontrolled at 14.2 today. Will have him increase the dose of his basaglar to 60 units. Explained he should be using sliding scale insulin prior to every meal. Discussed risk factors associated with long-term, uncontrolled diabetes. Will refer to endocrinology if no significant improvement at next visit.  - sitaGLIPtin-metformin (JANUMET) 50-500 MG tablet; Take 1 tablet by mouth 2 (two) times daily.  Dispense: 180 tablet; Refill: 1  2. Clotted blood in stool Stool sample negative for blood. Patient had unremarkable UGI and Ct of abdomen and pelvis. Will refer to GI for further evaluation.  - Ambulatory referral to Gastroenterology  3. Diarrhea, unspecified type Stool sample negative for bacterial or c.diff. Patient had unremarkable UGI and Ct of abdomen and pelvis. Will refer to GI for further evaluation.  - Ambulatory referral to Gastroenterology  4. Gastroesophageal reflux disease without esophagitis Changed zantac to pepcid 20mg  twice daily as  needed. Continue use of omeprazole as prescribed  - famotidine (PEPCID) 20 MG tablet; Take 1 tablet (20 mg total) by mouth 2 (two) times daily.  Dispense: 60 tablet; Refill: 3  5. Generalized anxiety disorder Refilled current prescriptions for trazodone, seroquel, and remeron. Gave 30 day prescriptions with 1 refill. Referred patient to new psychiatric provider for continued management.  - Ambulatory referral to Psychiatry - traZODone (DESYREL) 100 MG tablet; Take 3 tablets (300 mg total) by mouth at bedtime.  Dispense: 90 tablet; Refill: 1 - QUEtiapine (SEROQUEL XR) 200 MG 24 hr tablet; Take 1 tablet (200 mg total) by mouth at bedtime.  Dispense: 30 tablet; Refill: 1 - mirtazapine (REMERON) 45 MG tablet; Take 1 tablet (45 mg total) by mouth at bedtime.  Dispense: 30 tablet; Refill: 1  6. Essential hypertension Generally stable. Continue bp medication as prescribed   7. Shortness of breath Renew albuteraol rescue inhaler. Use 2 puffs every 4 to 6 hours as needed.  - albuterol (PROVENTIL HFA;VENTOLIN HFA) 108 (90 Base) MCG/ACT inhaler; Inhale 2 puffs into the lungs every 4 (four) hours as needed for wheezing or shortness of breath.  Dispense: 1 Inhaler; Refill: 3  General Counseling: Bonham verbalizes understanding of the findings of todays visit and agrees with plan of treatment. I have discussed any further diagnostic evaluation that may be needed or ordered today. We also reviewed his medications today. he has been  encouraged to call the office with any questions or concerns that should arise related to todays visit.  Diabetes Counseling:  1. Addition of ACE inh/ ARB'S for nephroprotection. Microalbumin is updated  2. Diabetic foot care, prevention of complications. Podiatry consult 3. Exercise and lose weight.  4. Diabetic eye examination, Diabetic eye exam is updated  5. Monitor blood sugar closlely. nutrition counseling.  6. Sign and symptoms of hypoglycemia including shaking  sweating,confusion and headaches.  Counseling: Adherence of Medical Therapy: The patient understands that it is the responsibility of the patient to complete all prescribed medications, all recommended testing, including but not limited to, laboratory studies and imaging. The patient further understands the need to keep all scheduled follow-up visits and to inform the office immediately of any changes in their medical condition. The patient understands that the success of treatment in large part depends on the patient's willingness to complete the therapeutic regimen and to work in partnership with the designated health-care providers.  This patient was seen by Leretha Pol FNP Collaboration with Dr Lavera Guise as a part of collaborative care agreement  Orders Placed This Encounter  Procedures  . Ambulatory referral to Gastroenterology  . Ambulatory referral to Psychiatry  . POCT HgB A1C    Meds ordered this encounter  Medications  . sitaGLIPtin-metformin (JANUMET) 50-500 MG tablet    Sig: Take 1 tablet by mouth 2 (two) times daily.    Dispense:  180 tablet    Refill:  1    D/c per error    Order Specific Question:   Supervising Provider    Answer:   Lavera Guise [[id number]]  . traZODone (DESYREL) 100 MG tablet    Sig: Take 3 tablets (300 mg total) by mouth at bedtime.    Dispense:  90 tablet    Refill:  1    Order Specific Question:   Supervising Provider    Answer:   Lavera Guise [[id number]]  . QUEtiapine (SEROQUEL XR) 200 MG 24 hr tablet    Sig: Take 1 tablet (200 mg total) by mouth at bedtime.    Dispense:  30 tablet    Refill:  1    Order Specific Question:   Supervising Provider    Answer:   Lavera Guise [[id number]]  . mirtazapine (REMERON) 45 MG tablet    Sig: Take 1 tablet (45 mg total) by mouth at bedtime.    Dispense:  30 tablet    Refill:  1    Order Specific Question:   Supervising Provider    Answer:   Lavera Guise [[id number]]  . famotidine (PEPCID) 20 MG tablet    Sig: Take  1 tablet (20 mg total) by mouth 2 (two) times daily.    Dispense:  60 tablet    Refill:  3    To replace rx for ranitidine    Order Specific Question:   Supervising Provider    Answer:   Lavera Guise [[id number]]  . albuterol (PROVENTIL HFA;VENTOLIN HFA) 108 (90 Base) MCG/ACT inhaler    Sig: Inhale 2 puffs into the lungs every 4 (four) hours as needed for wheezing or shortness of breath.    Dispense:  1 Inhaler    Refill:  3    Order Specific Question:   Supervising Provider    Answer:   Lavera Guise [[id number]]    Time spent: 41 Minutes      Dr Lavera Guise Internal medicine

## 2018-05-06 ENCOUNTER — Ambulatory Visit: Payer: Medicare Other | Admitting: Gastroenterology

## 2018-05-06 ENCOUNTER — Encounter: Payer: Self-pay | Admitting: Gastroenterology

## 2018-05-07 DIAGNOSIS — H4922 Sixth [abducent] nerve palsy, left eye: Secondary | ICD-10-CM | POA: Diagnosis not present

## 2018-05-08 ENCOUNTER — Other Ambulatory Visit (HOSPITAL_COMMUNITY): Payer: Self-pay | Admitting: Ophthalmology

## 2018-05-08 ENCOUNTER — Other Ambulatory Visit: Payer: Self-pay | Admitting: Ophthalmology

## 2018-05-08 DIAGNOSIS — H4922 Sixth [abducent] nerve palsy, left eye: Secondary | ICD-10-CM

## 2018-05-15 ENCOUNTER — Encounter: Payer: Self-pay | Admitting: Gastroenterology

## 2018-05-15 ENCOUNTER — Ambulatory Visit: Payer: Medicare Other | Admitting: Gastroenterology

## 2018-05-15 ENCOUNTER — Ambulatory Visit (INDEPENDENT_AMBULATORY_CARE_PROVIDER_SITE_OTHER): Payer: Medicare Other | Admitting: Gastroenterology

## 2018-05-15 VITALS — BP 154/85 | HR 90 | Ht 69.0 in | Wt 258.8 lb

## 2018-05-15 DIAGNOSIS — K760 Fatty (change of) liver, not elsewhere classified: Secondary | ICD-10-CM | POA: Diagnosis not present

## 2018-05-15 DIAGNOSIS — K648 Other hemorrhoids: Secondary | ICD-10-CM | POA: Diagnosis not present

## 2018-05-15 DIAGNOSIS — K625 Hemorrhage of anus and rectum: Secondary | ICD-10-CM

## 2018-05-15 MED ORDER — HYDROCORTISONE ACETATE 25 MG RE SUPP: 25.0000 mg | Freq: Every day | RECTAL | 0 refills | Status: AC

## 2018-05-15 NOTE — Patient Instructions (Addendum)

## 2018-05-15 NOTE — Progress Notes (Addendum)
Raymond [name] [address]  [place], [place] [address]  Main: [phone]  Fax: [phone]   Gastroenterology Consultation  Referring Provider:     Ronnell Freshwater, NP Primary Care Physician:  Ronnell Freshwater, NP Reason for Consultation:     Blood in stool        HPI:    Chief Complaint  Patient presents with  . New Patient (Initial Visit)    referral from H. Boscia, NP for clotted blood in stool, diarrhea    Raymond [name] is a 53 y.o. y/o male referred for consultation & management  by Dr. Ronnell Freshwater, NP.  Patient history of intermittent bright blood per rectum, last episode was 1 to 2 weeks ago.  However, he reports about 2 months ago he had a 2 to 96-month history of daily prior blood per rectum with clots.  States the clots were dark red to black looking blood around it was bright red.  No dizziness or fatigue around the symptoms.  Denies straining with his bowel movements.  States bowel movements are formed to loose.  Also reports heartburn.  States does not occur during the day but bothers him at night.  Sleeps on the bed wedge continues to have symptoms of reflux at night which wakes him up.  Therefore takes Protonix at bedtime which has improved symptoms but continues to have symptoms 2-3 times a week.  Does not feel the need for morning Protonix and does not have any symptoms during the day.  No dysphagia.  No nausea or vomiting.  No weight loss.  Also reports intermittent midepigastric to right to left upper quadrant pain intermittently.  Unrelated to meals.  Nonradiating, 1/10.  Patient has had previous work-up with EGD and colonoscopy in 2017 at [facility].  At that time he reported hematochezia as well.  4 subcentimeter polyps were removed and were hyperplastic.  Internal nonbleeding hemorrhoids were reported.  EGD showed grade B reflux esophagitis.  Esophageal mucosal changes suspicious for Barrett's were also reported.  Pathology report  showed no intestinal metaplasia.  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  Today patient has an eye patch on and states this is undergoing work-up with [facility] due to some eye changes and they have him scheduled for a brain scan to rule out a mini stroke.  He states he has not seen neurology yet and based on the brain scan they plan on referring him to neurology if needed.  Also has elevated blood sugar and hemoglobin A1c of 14.  Past Medical History:  Diagnosis Date  . Anxiety   . Depression   . Diabetes mellitus without complication ([place])   . GERD (gastroesophageal reflux disease)   . Headache   . Hyperlipidemia   . Hypertension   . Irregular heart beat unk  . Myocardial infarction ([place])   . PTSD (post-traumatic stress disorder)   . Tachycardia     Past Surgical History:  Procedure Laterality Date  .  ingrown toenail removal    . CLAVICLE SURGERY Right   . SHOULDER SURGERY Right     Prior to Admission medications   Medication Sig Start Date End Date Taking? Authorizing Provider  ACCU-CHEK FASTCLIX LANCETS MISC yse as directed twice a day 05/07/17  Yes Boscia, Heather E, NP  albuterol (PROVENTIL HFA;VENTOLIN HFA) 108 (90 Base) MCG/ACT inhaler Inhale 2 puffs into the lungs every 4 (four) hours as needed for wheezing or shortness  of breath. 04/01/18  Yes Boscia, Heather E, NP  amLODipine (NORVASC) 5 MG tablet Take 5 mg by mouth daily.   Yes [provider]  aspirin EC 81 MG tablet Take 81 mg by mouth. 01/12/16  Yes [provider]  atorvastatin (LIPITOR) 80 MG tablet Take by mouth. 01/12/16  Yes [provider]  famotidine (PEPCID) 20 MG tablet Take 1 tablet (20 mg total) by mouth 2 (two) times daily. 04/01/18  Yes Boscia, Heather E, NP  fluticasone (FLONASE) 50 MCG/ACT nasal spray 2 sprays by Each Nare route daily. 05/16/16  Yes [provider]  gabapentin (NEURONTIN) 100 MG capsule Take by mouth. 03/13/14  Yes [provider]  glimepiride (AMARYL) 4 MG tablet Take 1 tablet (4 mg total) by mouth daily with breakfast. 12/14/17  Yes Boscia, Heather E, NP  glucose blood (ACCU-CHEK AVIVA PLUS) test strip Blood sugar testing TID and as needed. E11.65 10/05/17  Yes Boscia, Greer Ee, NP  guaiFENesin 200 MG tablet Take 2 tablets (400 mg total) by mouth every 6 (six) hours as needed for cough or to loosen phlegm. 03/01/18  Yes Arta Silence, MD  hydrochlorothiazide (HYDRODIURIL) 25 MG tablet Take 25 mg by mouth daily.    Yes [provider]  hydrOXYzine (VISTARIL) 50 MG capsule Take 50 mg by mouth 2 (two) times daily as needed.   Yes [provider]  insulin aspart (NOVOLOG FLEXPEN) 100 UNIT/ML FlexPen Use as directed per sliding scale. Instructions provided. 10/05/17  Yes Ronnell Freshwater, NP  Insulin Glargine (BASAGLAR KWIKPEN) 100 UNIT/ML SOPN Inject up to 60 units [place] QD 12/31/17  Yes Boscia, Heather E, NP  metoprolol succinate (TOPROL-XL) 100 MG 24 hr tablet Take by mouth. 01/13/16  Yes [provider]  metoprolol tartrate (LOPRESSOR) 100 MG tablet Take 1 tablet (100 mg total) by mouth 2 (two) times daily. 12/14/17  Yes Boscia, Greer Ee, NP  mirtazapine (REMERON) 45 MG tablet Take 1 tablet (45 mg total) by mouth at bedtime. 04/01/18  Yes Boscia, Greer Ee, NP  nitroGLYCERIN (NITROSTAT) 0.4 MG SL tablet Place 0.4 mg under the tongue every 5 (five) minutes as needed for chest pain.   Yes [provider]  omeprazole (PRILOSEC) 40 MG capsule Take by mouth. 05/16/16  Yes [provider]  oxyCODONE-acetaminophen (PERCOCET) 5-325 MG tablet Take 1 tablet by mouth every 4 (four) hours as needed for moderate pain or severe pain. 07/22/17 07/22/18 Yes Schaevitz, Randall An, MD  pantoprazole (PROTONIX) 40 MG tablet Take 1 tablet (40 mg total) by mouth daily. 03/15/18  Yes Boscia, Greer Ee, NP  QUEtiapine (SEROQUEL XR) 200 MG 24 hr tablet Take 1 tablet (200 mg total) by mouth at  bedtime. 04/01/18  Yes Boscia, Greer Ee, NP  simvastatin (ZOCOR) 40 MG tablet Take 40 mg by mouth at bedtime.   Yes [provider]  sitaGLIPtin-metformin (JANUMET) 50-500 MG tablet Take 1 tablet by mouth 2 (two) times daily. 04/01/18  Yes Ronnell Freshwater, NP  traZODone (DESYREL) 100 MG tablet Take 3 tablets (300 mg total) by mouth at bedtime. 04/01/18  Yes Ronnell Freshwater, NP    Family History  Problem Relation Age of Onset  . Hypertension Mother   . Diabetes type II Mother   . Diabetes Mother   . COPD Father   . Cancer Father   . Heart disease Father   . Diabetes Sister   . Diabetes Brother   . Diabetes Maternal Aunt   .  Diabetes Sister   . Diabetes Sister   . Diabetes Sister      Social History   Tobacco Use  . Smoking status: Current Every Day Smoker    Packs/day: 1.50    Years: 40.00    Pack years: 60.00    Types: Cigarettes  . Smokeless tobacco: Former Systems developer    Types: Chew  Substance Use Topics  . Alcohol use: No    Alcohol/week: 0.0 standard drinks    Frequency: Never  . Drug use: No    Allergies as of 05/15/2018 - Review Complete 05/15/2018  Allergen Reaction Noted  . Onion Anaphylaxis 03/30/2015  . Hydrocodone  01/13/2014  . Norco [hydrocodone-acetaminophen] Itching 11/02/2014  . Hydrocodone-acetaminophen Itching 11/02/2014    Review of Systems:    All systems reviewed and negative except where noted in HPI.   Physical Exam:  BP (!) 154/85   Pulse 90   Ht 5\' 9"  (1.753 m)   Wt 258 lb 12.8 oz (117.4 kg)   BMI 38.22 kg/m  No LMP for male patient. Psych:  Alert and cooperative. Normal mood and affect. General:   Alert,  Well-developed, well-nourished, pleasant and cooperative in NAD Head:  Normocephalic and atraumatic. Eyes:  Sclera clear, no icterus.   Conjunctiva pink. Ears:  Normal auditory acuity. Nose:  No deformity, discharge, or lesions. Mouth:  No deformity or lesions,oropharynx pink & moist. Neck:  Supple; no masses or  thyromegaly. Abdomen:  Normal bowel sounds.  No bruits.  Soft, non-tender and non-distended without masses, hepatosplenomegaly or hernias noted.  No guarding or rebound tenderness.  Abdominal wall hernia present Msk:  Symmetrical without gross deformities. Good, equal movement & strength bilaterally. Pulses:  Normal pulses noted. Extremities:  No clubbing or edema.  No cyanosis. Neurologic:  Alert and oriented x3;  grossly normal neurologically. Skin:  Intact without significant lesions or rashes. No jaundice. Lymph Nodes:  No significant cervical adenopathy. Psych:  Alert and cooperative. Normal mood and affect.   Labs: CBC    Component Value Date/Time   WBC 7.9 03/01/2018 1244   RBC 5.19 03/01/2018 1244   HGB 14.6 03/01/2018 1244   HGB 14.4 12/31/2017 1103   HCT 42.3 03/01/2018 1244   HCT 41.5 12/31/2017 1103   PLT 221 03/01/2018 1244   PLT 209 11/28/2013 1506   MCV 81.5 03/01/2018 1244   MCV 85 12/31/2017 1103   MCV 91 11/28/2013 1506   MCH 28.1 03/01/2018 1244   MCHC 34.5 03/01/2018 1244   RDW 12.6 03/01/2018 1244   RDW 15.2 12/31/2017 1103   RDW 13.6 11/28/2013 1506   LYMPHSABS 3.5 07/21/2017 2310   MONOABS 0.8 07/21/2017 2310   EOSABS 0.2 07/21/2017 2310   BASOSABS 0.1 07/21/2017 2310   CMP     Component Value Date/Time   NA 131 (L) 03/01/2018 1244   NA 138 07/15/2015 1832   NA 138 11/28/2013 1506   K 4.5 03/01/2018 1244   K 3.7 11/28/2013 1506   CL 98 03/01/2018 1244   CL 105 11/28/2013 1506   CO2 21 (L) 03/01/2018 1244   CO2 26 11/28/2013 1506   GLUCOSE 547 (HH) 03/01/2018 1244   GLUCOSE 163 (H) 11/28/2013 1506   BUN 18 03/01/2018 1244   BUN 12 07/15/2015 1832   BUN 12 11/28/2013 1506   CREATININE 1.12 03/01/2018 1244   CREATININE 1.25 11/28/2013 1506   CALCIUM 9.1 03/01/2018 1244   CALCIUM 8.5 11/28/2013 1506   PROT 8.4 (H) 07/21/2017 2310  PROT 7.3 07/15/2015 1832   PROT 8.0 12/08/2012 0914   ALBUMIN 4.0 07/21/2017 2310   ALBUMIN 4.1 07/15/2015  1832   ALBUMIN 4.1 12/08/2012 0914   AST 52 (H) 07/21/2017 2310   AST 28 12/08/2012 0914   ALT 52 07/21/2017 2310   ALT 42 12/08/2012 0914   ALKPHOS 133 (H) 07/21/2017 2310   ALKPHOS 118 12/08/2012 0914   BILITOT 0.4 07/21/2017 2310   BILITOT 0.2 07/15/2015 1832   BILITOT 0.3 12/08/2012 0914   GFRNONAA >60 03/01/2018 1244   GFRNONAA >60 11/28/2013 1506   GFRAA >60 03/01/2018 1244   GFRAA >60 11/28/2013 1506    Imaging Studies: No results found.  Assessment and Plan:   MARQUIES WANAT is a 53 y.o. y/o male has been referred for blood per rectum  His symptoms are most consistent with internal hemorrhoids.  These were seen during his 2017 colonoscopy.  Risk of malignancy is low given colonoscopy within the last 3 years.  We will treat with Anusol suppository to see if it helps.  However, we also discussed that since he had a 2 to 62-month episode of daily bright red blood per rectum with blood clots and is also having severe heartburn, EGD and colonoscopy would allow [place] to rule out any underlying lesions such as AVMs, ulcers etc.  However, patient is undergoing work-up for recent eye changes has a MRI brain scheduled.  If he in fact has had a recent stroke, colonoscopy and EGD at this time would be high risk.  Therefore, will obtain notes from [facility] to see what they determine and proceed with procedures accordingly.  No urgency for procedures at this time given hemodynamic stability.  Hemoglobin was 14 and normal in November 2019 after his 2 to 21-month episode of blood per rectum.  Will repeat today as well.  If hemoglobin remains stable, no urgent indication for endoscopy.  Will not increase Protonix to twice daily since his symptoms only occur at night and once daily medication has improved symptoms.  He does eat at bedtime and I have advised him to not eat 3 hours before bedtime  Patient educated extensively on acid reflux lifestyle modification, including buying a bed  wedge, not eating 3 hrs before bedtime, diet modifications, and handout given for the same.   PCP to also work on optimizing blood sugars prior to procedures  He has an abdominal wall hernia on physical exam.  PCP can consider referral to surgery.  Nonincarcerated at this time.  Hepatic steatosis noted on CT abdomen from March 2019.  Will discuss on next visit in order further work-up as needed.  Follow-up in 3 to 4 weeks to assess if safe to proceed with endoscopy based on [facility] and MRI brain findings   Addendum: [facility] note from May 07, 2018 Impression: left partial CN6 palsy.  Likely due to uncontrolled diabetes.  Diabetes without retinopathy.  Recommend patching.  Patient declined imaging, follow-up 6 weeks.  Dr Raymond [name]  Speech recognition software was used to dictate the above note.

## 2018-05-16 ENCOUNTER — Other Ambulatory Visit: Payer: Self-pay | Admitting: Ophthalmology

## 2018-05-16 DIAGNOSIS — S0540XA Penetrating wound of orbit with or without foreign body, unspecified eye, initial encounter: Secondary | ICD-10-CM

## 2018-05-16 LAB — HEMOGLOBIN: HEMOGLOBIN: 14.5 g/dL (ref 13.0–17.7)

## 2018-05-21 ENCOUNTER — Ambulatory Visit
Admission: RE | Admit: 2018-05-21 | Discharge: 2018-05-21 | Disposition: A | Discharge status: Home / Self Care | Payer: Medicare Other | Source: Ambulatory Visit | Attending: Ophthalmology | Admitting: Ophthalmology

## 2018-05-21 DIAGNOSIS — H4922 Sixth [abducent] nerve palsy, left eye: Secondary | ICD-10-CM | POA: Insufficient documentation

## 2018-05-21 DIAGNOSIS — S0540XA Penetrating wound of orbit with or without foreign body, unspecified eye, initial encounter: Secondary | ICD-10-CM

## 2018-05-21 DIAGNOSIS — Z135 Encounter for screening for eye and ear disorders: Secondary | ICD-10-CM | POA: Diagnosis not present

## 2018-05-21 DIAGNOSIS — R51 Headache: Secondary | ICD-10-CM | POA: Diagnosis not present

## 2018-05-21 LAB — POCT I-STAT CREATININE: CREATININE: 1 mg/dL (ref 0.61–1.24)

## 2018-05-21 MED ORDER — GADOBUTROL 1 MMOL/ML IV SOLN
10.0000 mL | Freq: Once | INTRAVENOUS | Status: AC | PRN
  Administered 2018-05-21: 10 mL via INTRAVENOUS

## 2018-05-21 NOTE — Progress Notes (Signed)
Call to see pt for questionable mild scratchy throat after gadolinium administration.Pt. without complaints when I arrived.He thinks he was having some mild reflux which he says he has problems with.No cp,sob,loc.VSS.Skin clear,no hives.HEENTclear.Lungs-clear.Cardiac-rrr. Assessment: reflux,no contrast reaction. Pt allowed to cont. With MRI.

## 2018-05-23 ENCOUNTER — Ambulatory Visit (INDEPENDENT_AMBULATORY_CARE_PROVIDER_SITE_OTHER): Payer: Medicare Other | Admitting: Psychiatry

## 2018-05-23 ENCOUNTER — Other Ambulatory Visit: Payer: Self-pay

## 2018-05-23 ENCOUNTER — Encounter: Payer: Self-pay | Admitting: Psychiatry

## 2018-05-23 VITALS — BP 163/96 | HR 96 | Temp 99.0°F | Wt 260.0 lb

## 2018-05-23 DIAGNOSIS — F4310 Post-traumatic stress disorder, unspecified: Secondary | ICD-10-CM

## 2018-05-23 DIAGNOSIS — F331 Major depressive disorder, recurrent, moderate: Secondary | ICD-10-CM

## 2018-05-23 DIAGNOSIS — F5105 Insomnia due to other mental disorder: Secondary | ICD-10-CM | POA: Diagnosis not present

## 2018-05-23 DIAGNOSIS — R030 Elevated blood-pressure reading, without diagnosis of hypertension: Secondary | ICD-10-CM

## 2018-05-23 MED ORDER — MIRTAZAPINE 45 MG PO TABS: 45.0000 mg | ORAL_TABLET | Freq: Every day | ORAL | 1 refills | Status: DC

## 2018-05-23 MED ORDER — QUETIAPINE FUMARATE ER 200 MG PO TB24: 200.0000 mg | ORAL_TABLET | Freq: Every day | ORAL | 1 refills | Status: DC

## 2018-05-23 MED ORDER — ZOLPIDEM TARTRATE 5 MG PO TABS: 5.0000 mg | ORAL_TABLET | Freq: Every evening | ORAL | 0 refills | Status: DC | PRN

## 2018-05-23 MED ORDER — QUETIAPINE FUMARATE 50 MG PO TABS: 50.0000 mg | ORAL_TABLET | Freq: Two times a day (BID) | ORAL | 1 refills | Status: DC | PRN
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
## 2018-05-23 NOTE — Patient Instructions (Signed)
Zolpidem tablets  What is this medicine?  ZOLPIDEM (zole PI dem) is used to treat insomnia. This medicine helps you to fall asleep and sleep through the night.  This medicine may be used for other purposes; ask your health care provider or pharmacist if you have questions.  COMMON BRAND NAME(S): Ambien  What should I tell my health care provider before I take this medicine?  They need to know if you have any of these conditions:  -depression  -history of drug abuse or addiction  -if you often drink alcohol  -liver disease  -lung or breathing disease  -myasthenia gravis  -sleep apnea  -sleep-walking, driving, eating or other activity while not fully awake after taking a sleep medicine  -suicidal thoughts, plans, or attempt; a previous suicide attempt by you or a family member  -an unusual or allergic reaction to zolpidem, other medicines, foods, dyes, or preservatives  -pregnant or trying to get pregnant  -breast-feeding  How should I use this medicine?  Take this medicine by mouth with a glass of water. Follow the directions on the prescription label. It is better to take this medicine on an empty stomach and only when you are ready for bed. Do not take your medicine more often than directed. If you have been taking this medicine for several weeks and suddenly stop taking it, you may get unpleasant withdrawal symptoms. Your doctor or health care professional may want to gradually reduce the dose. Do not stop taking this medicine on your own. Always follow your doctor or health care professional's advice.  A special MedGuide will be given to you by the pharmacist with each prescription and refill. Be sure to read this information carefully each time.  Talk to your pediatrician regarding the use of this medicine in children. Special care may be needed.  Overdosage: If you think you have taken too much of this medicine contact a poison control center or emergency room at once.  NOTE: This medicine is only for  you. Do not share this medicine with others.  What if I miss a dose?  This does not apply. This medicine should only be taken immediately before going to sleep. Do not take double or extra doses.  What may interact with this medicine?  -alcohol  -antihistamines for allergy, cough and cold  -certain medicines for anxiety or sleep  -certain medicines for depression, like amitriptyline, fluoxetine, sertraline  -certain medicines for fungal infections like ketoconazole and itraconazole  -certain medicines for seizures like phenobarbital, primidone  -ciprofloxacin  -dietary supplements for sleep, like valerian or kava kava  -general anesthetics like halothane, isoflurane, methoxyflurane, propofol  -local anesthetics like lidocaine, pramoxine, tetracaine  -medicines that relax muscles for surgery  -narcotic medicines for pain  -phenothiazines like chlorpromazine, mesoridazine, prochlorperazine, thioridazine  -rifampin  This list may not describe all possible interactions. Give your health care provider a list of all the medicines, herbs, non-prescription drugs, or dietary supplements you use. Also tell them if you smoke, drink alcohol, or use illegal drugs. Some items may interact with your medicine.  What should I watch for while using this medicine?  Visit your doctor or health care professional for regular checks on your progress. Keep a regular sleep schedule by going to bed at about the same time each night. Avoid caffeine-containing drinks in the evening hours. When sleep medicines are used every night for more than a few weeks, they may stop working. Talk to your doctor if you still have trouble   sleeping.  After taking this medicine, you may get up out of bed and do an activity that you do not know you are doing. The next morning, you may have no memory of this. Activities include driving a car ("sleep-driving"), making and eating food, talking on the phone, sexual activity, and sleep-walking. Serious injuries have  occurred. Stop the medicine and call your doctor right away if you find out you have done any of these activities. Do not take this medicine if you have used alcohol that evening. Do not take it if you have taken another medicine for sleep. The risk of doing these sleep-related activities is higher.  Wait for at least 8 hours after you take a dose before driving or doing other activities that require full mental alertness. Do not take this medicine unless you are able to stay in bed for a full night (7 to 8 hours) before you must be active again. You may have a decrease in mental alertness the day after use, even if you feel that you are fully awake. Tell your doctor if you will need to perform activities requiring full alertness, such as driving, the next day. Do not stand or sit up quickly after taking this medicine, especially if you are an older patient. This reduces the risk of dizzy or fainting spells.  If you or your family notice any changes in your behavior, such as new or worsening depression, thoughts of harming yourself, anxiety, other unusual or disturbing thoughts, or memory loss, call your doctor right away.  After you stop taking this medicine, you may have trouble falling asleep. This is called rebound insomnia. This problem usually goes away on its own after 1 or 2 nights.  What side effects may I notice from receiving this medicine?  Side effects that you should report to your doctor or health care professional as soon as possible:  -allergic reactions like skin rash, itching or hives, swelling of the face, lips, or tongue  -breathing problems  -changes in vision  -confusion  -depressed mood or other changes in moods or emotions  -feeling faint or lightheaded, falls  -hallucinations  -loss of balance or coordination  -loss of memory  -numbness or tingling of the tongue  -restlessness, excitability, or feelings of anxiety or agitation  -signs and symptoms of liver injury like dark yellow or brown  urine; general ill feeling or flu-like symptoms; light-colored stools; loss of appetite; nausea; right upper belly pain; unusually weak or tired; yellowing of the eyes or skin  -suicidal thoughts  -unusual activities while not fully awake like driving, eating, making phone calls, or sexual activity  Side effects that usually do not require medical attention (report to your doctor or health care professional if they continue or are bothersome):  -dizziness  -drowsiness the day after you take this medicine  -headache  This list may not describe all possible side effects. Call your doctor for medical advice about side effects. You may report side effects to FDA at 1-800-FDA-1088.  Where should I keep my medicine?  Keep out of the reach of children. This medicine can be abused. Keep your medicine in a safe place to protect it from theft. Do not share this medicine with anyone. Selling or giving away this medicine is dangerous and against the law.  This medicine may cause accidental overdose and death if taken by other adults, children, or pets. Mix any unused medicine with a substance like cat litter or coffee grounds. Then throw   the medicine away in a sealed container like a sealed bag or a coffee can with a lid. Do not use the medicine after the expiration date.  Store at room temperature between 20 and 25 degrees C (68 and 77 degrees F).  NOTE: This sheet is a summary. It may not cover all possible information. If you have questions about this medicine, talk to your doctor, pharmacist, or health care provider.   2019 Elsevier/Gold Standard (2017-12-28 11:51:08)

## 2018-05-28 ENCOUNTER — Encounter: Payer: Self-pay | Admitting: Nurse Practitioner

## 2018-06-06 ENCOUNTER — Ambulatory Visit (INDEPENDENT_AMBULATORY_CARE_PROVIDER_SITE_OTHER): Payer: Medicare Other | Admitting: Psychiatry

## 2018-06-06 ENCOUNTER — Encounter: Payer: Self-pay | Admitting: Psychiatry

## 2018-06-06 ENCOUNTER — Other Ambulatory Visit: Payer: Self-pay

## 2018-06-06 VITALS — BP 150/90 | HR 99 | Temp 101.0°F | Wt 257.8 lb

## 2018-06-06 DIAGNOSIS — F4310 Post-traumatic stress disorder, unspecified: Secondary | ICD-10-CM

## 2018-06-06 DIAGNOSIS — F5105 Insomnia due to other mental disorder: Secondary | ICD-10-CM | POA: Diagnosis not present

## 2018-06-06 DIAGNOSIS — F331 Major depressive disorder, recurrent, moderate: Secondary | ICD-10-CM | POA: Diagnosis not present

## 2018-06-06 MED ORDER — SUVOREXANT 10 MG PO TABS: 10.0000 mg | ORAL_TABLET | Freq: Every day | ORAL | 0 refills | Status: DC

## 2018-06-06 NOTE — Progress Notes (Signed)
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  Patient today reports he continues to struggle with sleep.  He reports the Ambien is not helpful.  He reports the Seroquel at bedtime also does not seem to help.  He continues to take Seroquel during the day which helps with his mood symptoms.  He also takes mirtazapine at bedtime.  He reports these medications as helpful with his mood symptoms however he continues to struggle with sleep which is a major stressor for him.  Patient has tried medications like Lunesta, trazodone, doxepin previously.  Discussed Belsomra.  Patient agrees with plan.  Patient today reports his diplopia has improved and he does not have to wear the eye patch all the time.  Patient denies any suicidality, homicidality or perceptual disturbances.  Patient denies any other concerns today. Visit Diagnosis:    ICD-10-CM   1. PTSD (post-traumatic stress disorder) F43.10   2. MDD (major depressive disorder), recurrent episode, moderate (HCC) F33.1   3. Insomnia due to mental disorder F51.05 Suvorexant (BELSOMRA) 10 MG TABS    Past Psychiatric History: Reviewed past psychiatric history from my progress note on 05/23/2018.  Past trials of Prozac, Zoloft, Celexa, Tegretol, doxepin, Klonopin, Seroquel, mirtazapine, Lunesta, Ambien.  Past Medical History:  Past Medical History:  Diagnosis Date  . Anxiety   . Depression   . Diabetes mellitus without complication ([place])   . GERD (gastroesophageal reflux disease)   . Headache   . Hyperlipidemia   . Hypertension   .  Irregular heart beat unk  . Myocardial infarction ([place])   . PTSD (post-traumatic stress disorder)   . Tachycardia     Past Surgical History:  Procedure Laterality Date  .  ingrown toenail removal    . CLAVICLE SURGERY Right   . SHOULDER SURGERY Right     Family Psychiatric History: I have reviewed family psychiatric history from my progress note on 05/23/2018  Family History:  Family History  Problem Relation Age of Onset  . Hypertension Mother   . Diabetes type II Mother   . Diabetes Mother   . COPD Father   . Cancer Father   . Heart disease Father   . Diabetes Sister   . Anxiety disorder Sister   . Depression Sister   . Diabetes Brother   . Anxiety disorder Brother   . Depression Brother   . Diabetes Maternal Aunt   . Diabetes Sister   . Anxiety disorder Sister   . Depression Sister   . Diabetes Sister   . Anxiety disorder Sister   . Depression Sister   . Diabetes Sister   . Anxiety disorder Sister   . Depression Sister     Social History: Reviewed social history from my progress note on 05/23/2018. Social History   Socioeconomic History  . Marital status: Divorced    Spouse name: Not on file  . Number of children: 4  . Years of education: Not on file  . Highest education level: 8th grade  Occupational History  . Not on file  Social Needs  . Emergency planning/management officer  strain: Very hard  . Food insecurity:    Worry: Often true    Inability: Often true  . Transportation needs:    Medical: No    Non-medical: No  Tobacco Use  . Smoking status: Current Every Day Smoker    Packs/day: 2.00    Years: 40.00    Pack years: 80.00    Types: Cigarettes  . Smokeless tobacco: Former Systems developer    Types: Chew  Substance and Sexual Activity  . Alcohol use: No    Alcohol/week: 0.0 standard drinks    Frequency: Never  . Drug use: No  . Sexual activity: Not Currently  Lifestyle  . Physical activity:    Days per week: 0 days    Minutes per session: 0 min  . Stress: Very  much  Relationships  . Social connections:    Talks on phone: Not on file    Gets together: Not on file    Attends religious service: Never    Active member of club or organization: No    Attends meetings of clubs or organizations: Never    Relationship status: Divorced  Other Topics Concern  . Not on file  Social History Narrative  . Not on file    Allergies:  Allergies  Allergen Reactions  . Onion Anaphylaxis  . Hydrocodone   . Norco [Hydrocodone-Acetaminophen] Itching  . Hydrocodone-Acetaminophen Itching    Metabolic Disorder Labs: Lab Results  Component Value Date   HGBA1C 14.2 (A) 04/01/2018   MPG 294.83 05/31/2017   No results found for: PROLACTIN Lab Results  Component Value Date   CHOL 134 07/15/2015   TRIG 227 (H) 07/15/2015   HDL 28 (L) 07/15/2015   CHOLHDL 4.8 07/15/2015   VLDL 26 12/09/2012   LDLCALC 61 07/15/2015   LDLCALC 96 12/09/2012   Lab Results  Component Value Date   TSH 2.000 07/15/2015   TSH 2.379 03/30/2015    Therapeutic Level Labs: No results found for: LITHIUM No results found for: VALPROATE No components found for:  CBMZ  Current Medications: Current Outpatient Medications  Medication Sig Dispense Refill  . ACCU-CHEK FASTCLIX LANCETS MISC yse as directed twice a day 100 each 1  . albuterol (PROVENTIL HFA;VENTOLIN HFA) 108 (90 Base) MCG/ACT inhaler Inhale 2 puffs into the lungs every 4 (four) hours as needed for wheezing or shortness of breath. 1 Inhaler 3  . aspirin EC 81 MG tablet Take 81 mg by mouth.    Marland Kitchen atorvastatin (LIPITOR) 80 MG tablet Take by mouth.    . famotidine (PEPCID) 20 MG tablet Take 1 tablet (20 mg total) by mouth 2 (two) times daily. 60 tablet 3  . fluticasone (FLONASE) 50 MCG/ACT nasal spray 2 sprays by Each Nare route daily.    Marland Kitchen glucose blood (ACCU-CHEK AVIVA PLUS) test strip Blood sugar testing TID and as needed. E11.65 100 each 12  . hydrochlorothiazide (HYDRODIURIL) 25 MG tablet Take 25 mg by mouth daily.      . hydrOXYzine (VISTARIL) 50 MG capsule Take 50 mg by mouth 2 (two) times daily as needed.    . insulin aspart (NOVOLOG FLEXPEN) 100 UNIT/ML FlexPen Use as directed per sliding scale. Instructions provided. 15 mL 11  . Insulin Glargine (BASAGLAR KWIKPEN) 100 UNIT/ML SOPN Inject up to 60 units Winder QD 5 pen 5  . metoprolol tartrate (LOPRESSOR) 100 MG tablet Take 1 tablet (100 mg total) by mouth 2 (two) times daily. 180 tablet 1  . mirtazapine (REMERON) 45 MG tablet Take  1 tablet (45 mg total) by mouth at bedtime. 30 tablet 1  . nitroGLYCERIN (NITROSTAT) 0.4 MG SL tablet Place 0.4 mg under the tongue every 5 (five) minutes as needed for chest pain.    . pantoprazole (PROTONIX) 40 MG tablet Take 1 tablet (40 mg total) by mouth daily. 30 tablet 3  . QUEtiapine (SEROQUEL) 50 MG tablet Take 1 tablet (50 mg total) by mouth 2 (two) times daily as needed. 60 tablet 1  . simvastatin (ZOCOR) 40 MG tablet Take 40 mg by mouth at bedtime.    . sitaGLIPtin-metformin (JANUMET) 50-500 MG tablet Take 1 tablet by mouth 2 (two) times daily. 180 tablet 1  . Suvorexant (BELSOMRA) 10 MG TABS Take 10 mg by mouth at bedtime. 30 tablet 0   No current facility-administered medications for this visit.      Musculoskeletal: Strength & Muscle Tone: within normal limits Gait & Station: normal Patient leans: N/A  Psychiatric Specialty Exam: Review of Systems  Psychiatric/Behavioral: The patient is nervous/anxious and has insomnia.   All other systems reviewed and are negative.   Blood pressure (!) 150/90, pulse 99, temperature (!) 101 F (38.3 C), temperature source Oral, weight 257 lb 12.8 oz (116.9 kg).Body mass index is 38.07 kg/m.  General Appearance: Casual  Eye Contact:  Fair  Speech:  Normal Rate  Volume:  Normal  Mood:  Anxious  Affect:  Congruent  Thought Process:  Goal Directed and Descriptions of Associations: Intact  Orientation:  Full (Time, Place, and Person)  Thought Content: Logical    Suicidal Thoughts:  No  Homicidal Thoughts:  No  Memory:  Immediate;   Fair Recent;   Fair Remote;   Fair  Judgement:  Fair  Insight:  Fair  Psychomotor Activity:  Normal  Concentration:  Concentration: Fair and Attention Span: Fair  Recall:  [employer] of Knowledge: Fair  Language: Fair  Akathisia:  No  Handed:  Right  AIMS (if indicated): denies tremors, rigidity,stiffness  Assets:  Communication Skills Desire for Improvement Social Support  ADL's:  Intact  Cognition: WNL  Sleep:  Poor   Screenings: Mini-Mental     Clinical Support from 10/05/2017 in [facility], [facility]  Total Score (max 30 points )  30    PHQ2-9     Office Visit from 04/01/2018 in [facility], [facility] Office Visit from 12/31/2017 in [facility], [facility] Office Visit from 10/23/2017 in [facility], [place] from 10/05/2017 in [facility], [facility] Nutrition from 07/06/2017 in [place]  PHQ-2 Total Score  0  0  5  0  4  PHQ-9 Total Score  -  -  20  -  16       Assessment and Plan: Raymond [name] is a 53 year old Caucasian male who is on disability, lives in [place], divorced, has a history of PTSD, MDD, insomnia, uncontrolled diabetes melitis, 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  Patient is biologically predisposed given his multiple medical problems, history of trauma.  Patient also has a history of suicide attempts however currently denies it.  Patient currently has psychosocial stressors of his health problems.  Continues to struggle with sleep.  We will continue to make medication changes.  Plan MDD- improving Discontinue Seroquel extended release at bedtime for lack of efficacy Seroquel 50 mg p.o. twice daily PRN. Continue mirtazapine 45 mg p.o. nightly Referred for CBT.  For PTSD- unstable Discontinue  Ambien. Start Belsomra 10 mg p.o. nightly Referred for  CBT  For insomnia-unstable Discontinue Ambien. Start Belsomra 10 mg p.o. nightly  I have spent atleast 15 minutes face to face with patient today. More than 50 % of the time was spent for psychoeducation and supportive psychotherapy and care coordination.   Follow-up in clinic in 1 week or sooner if needed.  This note was generated in part or whole with voice recognition software. Voice recognition is usually quite accurate but there are transcription errors that can and very often do occur. I apologize for any typographical errors that were not detected and corrected.         Ursula Alert, MD 06/06/2018, 5:41 PM

## 2018-06-06 NOTE — Patient Instructions (Signed)
Suvorexant oral tablets What is this medicine? SUVOREXANT (su-vor-EX-ant) is used to treat insomnia. This medicine helps you to fall asleep and sleep through the night. This medicine may be used for other purposes; ask your health care provider or pharmacist if you have questions. COMMON BRAND NAME(S): Belsomra What should I tell my health care provider before I take this medicine? They need to know if you have any of these conditions: -depression -history of a drug or alcohol abuse problem -history of daytime sleepiness -history of sudden onset of muscle weakness (cataplexy) -liver disease -lung or breathing disease -narcolepsy -suicidal thoughts, plans, or attempt; a previous suicide attempt by you or a family member -an unusual or allergic reaction to suvorexant, other medicines, foods, dyes, or preservatives -pregnant or trying to get pregnant -breast-feeding How should I use this medicine? Take this medicine by mouth within 30 minutes of going to bed. Do not take it unless you are able to stay in bed a full night before you must be active again. Follow the directions on the prescription label. For best results, it is better to take this medicine on an empty stomach. Do not take your medicine more often than directed. Do not stop taking this medicine on your own. Always follow your doctor or health care professional's advice. A special MedGuide will be given to you by the pharmacist with each prescription and refill. Be sure to read this information carefully each time. Talk to your pediatrician regarding the use of this medicine in children. Special care may be needed. Overdosage: If you think you have taken too much of this medicine contact a poison control center or emergency room at once. NOTE: This medicine is only for you. Do not share this medicine with others. What if I miss a dose? This medicine should only be taken immediately before going to sleep. Do not take double or extra  doses. What may interact with this medicine? -alcohol -antiviral medicines for HIV or AIDS -aprepitant -carbamazepine -certain antibiotics like ciprofloxacin, clarithromycin, erythromycin, telithromycin -certain medicines for depression or psychotic disturbances -certain medicines for fungal infections like ketoconazole, posaconazole, fluconazole, or itraconazole -conivaptan -digoxin -diltiazem -grapefruit juice -imatinib -medicines for anxiety or sleep -phenytoin -rifampin -verapamil This list may not describe all possible interactions. Give your health care provider a list of all the medicines, herbs, non-prescription drugs, or dietary supplements you use. Also tell them if you smoke, drink alcohol, or use illegal drugs. Some items may interact with your medicine. What should I watch for while using this medicine? Visit your doctor or health care professional for regular checks on your progress. Keep a regular sleep schedule by going to bed at about the same time each night. Avoid caffeine-containing drinks in the evening hours. When sleep medicines are used every night for more than a few weeks, they may stop working. Do not increase the dose on your own. Talk to your doctor if your insomnia worsens or is not better within 7 to 10 days. After taking this medicine, you may get up out of bed and do an activity that you do not know you are doing. The next morning, you may have no memory of this. Activities include driving a car ("sleep-driving"), making and eating food, talking on the phone, sexual activity, and sleep-walking. Serious injuries have occurred. Call your doctor right away if you find out you have done any of these activities. Do not take this medicine if you have used alcohol that evening. Do not take it   if you have taken another medicine for sleep. The risk of doing these sleep-related activities is higher. Do not take this medicine unless you are able to stay in bed for a full  night (7 to 8 hours) and do not drive or perform other activities requiring full alertness within 8 hours of a dose. Do not drive, use machinery, or do anything that needs mental alertness the day after you take the 20 mg dose of this medicine. The use of lower doses (10 mg) also has the potential to cause driving impairment the next day. You may have a decrease in mental alertness the day after use, even if you feel that you are fully awake. Tell your doctor if you will need to perform activities requiring full alertness, such as driving, the next day. Do not stand or sit up quickly after taking this medicine, especially if you are an older patient. This reduces the risk of dizzy or fainting spells. If you or your family notice any changes in your behavior, such as new or worsening depression, thoughts of harming yourself, anxiety, other unusual or disturbing thoughts, or memory loss, call your doctor right away. What side effects may I notice from receiving this medicine? Side effects that you should report to your doctor or health care professional as soon as possible: -allergic reactions like skin rash, itching or hives, swelling of the face, lips, or tongue -confusion -depressed mood -feeling faint or lightheaded, falls -hallucinations -inability to move or speak for up to several minutes while you are going to sleep or waking up -memory loss -periods of leg weakness lasting from seconds to a few minutes -problems with balance, speaking, walking -restlessness, excitability, or feelings of agitation -unusual activities while asleep like driving, eating, making phone calls Side effects that usually do not require medical attention (report to your doctor or health care professional if they continue or are bothersome): -abnormal dreams -daytime drowsiness -diarrhea -dizziness -headache This list may not describe all possible side effects. Call your doctor for medical advice about side effects.  You may report side effects to FDA at 1-800-FDA-1088. Where should I keep my medicine? Keep out of the reach of children. This medicine can be abused. Keep your medicine in a safe place to protect it from theft. Do not share this medicine with anyone. Selling or giving away this medicine is dangerous and against the law. Store at room temperature between 15 and 30 degrees C (59 and 86 degrees F). Throw away any unused medicine after the expiration date. NOTE: This sheet is a summary. It may not cover all possible information. If you have questions about this medicine, talk to your doctor, pharmacist, or health care provider.  2019 Elsevier/Gold Standard (2017-10-05 12:32:42)  

## 2018-06-10 ENCOUNTER — Other Ambulatory Visit: Payer: Self-pay | Admitting: Psychiatry

## 2018-06-10 ENCOUNTER — Encounter: Payer: Self-pay | Admitting: Gastroenterology

## 2018-06-10 ENCOUNTER — Ambulatory Visit (INDEPENDENT_AMBULATORY_CARE_PROVIDER_SITE_OTHER): Payer: Medicare Other | Admitting: Gastroenterology

## 2018-06-10 ENCOUNTER — Ambulatory Visit: Payer: Medicare Other | Admitting: Licensed Clinical Social Worker

## 2018-06-10 VITALS — BP 149/87 | HR 88 | Ht 69.0 in | Wt 256.0 lb

## 2018-06-10 DIAGNOSIS — K439 Ventral hernia without obstruction or gangrene: Secondary | ICD-10-CM

## 2018-06-10 DIAGNOSIS — K760 Fatty (change of) liver, not elsewhere classified: Secondary | ICD-10-CM

## 2018-06-10 DIAGNOSIS — K648 Other hemorrhoids: Secondary | ICD-10-CM

## 2018-06-10 NOTE — Patient Instructions (Signed)
F/U 4-6 months    Fatty Liver Disease  Fatty liver disease occurs when too much fat has built up in your liver cells. Fatty liver disease is also called hepatic steatosis or steatohepatitis. The liver removes harmful substances from your bloodstream and produces fluids that your body needs. It also helps your body use and store energy from the food you eat. In many cases, fatty liver disease does not cause symptoms or problems. It is often diagnosed when tests are being done for other reasons. However, over time, fatty liver can cause inflammation that may lead to more serious liver problems, such as scarring of the liver (cirrhosis) and liver failure. Fatty liver is associated with insulin resistance, increased body fat, high blood pressure (hypertension), and high cholesterol. These are features of metabolic syndrome and increase your risk for stroke, diabetes, and heart disease. What are the causes? This condition may be caused by:  Drinking too much alcohol.  Poor nutrition.  Obesity.  Cushing's syndrome.  Diabetes.  High cholesterol.  Certain drugs.  Poisons.  Some viral infections.  Pregnancy. What increases the risk? You are more likely to develop this condition if you:  Abuse alcohol.  Are overweight.  Have diabetes.  Have hepatitis.  Have a high triglyceride level.  Are pregnant. What are the signs or symptoms? Fatty liver disease often does not cause symptoms. If symptoms do develop, they can include:  Fatigue.  Weakness.  Weight loss.  Confusion.  Abdominal pain.  Nausea and vomiting.  Yellowing of your skin and the white parts of your eyes (jaundice).  Itchy skin. How is this diagnosed? This condition may be diagnosed by:  A physical exam and medical history.  Blood tests.  Imaging tests, such as an ultrasound, CT scan, or MRI.  A liver biopsy. A small sample of liver tissue is removed using a needle. The sample is then looked at  under a microscope. How is this treated? Fatty liver disease is often caused by other health conditions. Treatment for fatty liver may involve medicines and lifestyle changes to manage conditions such as:  Alcoholism.  High cholesterol.  Diabetes.  Being overweight or obese. Follow these instructions at home:   Do not drink alcohol. If you have trouble quitting, ask your health care provider how to safely quit with the help of medicine or a supervised program. This is important to keep your condition from getting worse.  Eat a healthy diet as told by your health care provider. Ask your health care provider about working with a diet and nutrition specialist (dietitian) to develop an eating plan.  Exercise regularly. This can help you lose weight and control your cholesterol and diabetes. Talk to your health care provider about an exercise plan and which activities are best for you.  Take over-the-counter and prescription medicines only as told by your health care provider.  Keep all follow-up visits as told by your health care provider. This is important. Contact a health care provider if: You have trouble controlling your:  Blood sugar. This is especially important if you have diabetes.  Cholesterol.  Drinking of alcohol. Get help right away if:  You have abdominal pain.  You have jaundice.  You have nausea and vomiting.  You vomit blood or material that looks like coffee grounds.  You have stools that are black, tar-like, or bloody. Summary  Fatty liver disease develops when too much fat builds up in the cells of your liver.  Fatty liver disease often causes  no symptoms or problems. However, over time, fatty liver can cause inflammation that may lead to more serious liver problems, such as scarring of the liver (cirrhosis).  You are more likely to develop this condition if you abuse alcohol, are pregnant, are overweight, have diabetes, have hepatitis, or have high  triglyceride levels.  Contact your health care provider if you have trouble controlling your weight, blood sugar, cholesterol, or drinking of alcohol. This information is not intended to replace advice given to you by your health care provider. Make sure you discuss any questions you have with your health care provider. Document Released: 05/26/2005 Document Revised: 01/17/2017 Document Reviewed: 01/17/2017 Elsevier Interactive Patient Education  2019 [employer].

## 2018-06-11 LAB — HEPATIC FUNCTION PANEL
ALT: 79 IU/L — AB (ref 0–44)
AST: 87 IU/L — AB (ref 0–40)
Albumin: 4.5 g/dL (ref 3.8–4.9)
Alkaline Phosphatase: 153 IU/L — ABNORMAL HIGH (ref 39–117)
Bilirubin, Direct: 0.1 mg/dL (ref 0.00–0.40)
Total Protein: 8.3 g/dL (ref 6.0–8.5)

## 2018-06-11 LAB — HEPATITIS A ANTIBODY, TOTAL: Hep A Total Ab: POSITIVE — AB

## 2018-06-11 LAB — TSH: TSH: 4.12 u[IU]/mL (ref 0.450–4.500)

## 2018-06-11 LAB — HEPATITIS B SURFACE ANTIGEN: Hepatitis B Surface Ag: NEGATIVE

## 2018-06-11 LAB — HCV COMMENT:

## 2018-06-11 LAB — VITAMIN B12: VITAMIN B 12: 363 pg/mL (ref 232–1245)

## 2018-06-11 LAB — HEPATITIS B SURFACE ANTIBODY,QUALITATIVE: Hep B Surface Ab, Qual: NONREACTIVE

## 2018-06-11 LAB — HEPATITIS C ANTIBODY (REFLEX): HCV Ab: <0.1 s/co ratio (ref 0.0–0.9)

## 2018-06-11 LAB — HEPATITIS B CORE ANTIBODY, TOTAL: Hep B Core Total Ab: NEGATIVE

## 2018-06-11 NOTE — Progress Notes (Signed)
[name], MD [address]  [place]  [place], [place] [address]  Main: [phone]  Fax: [phone]   Primary Care Physician: Ronnell Freshwater, NP   Chief Complaint  Patient presents with  . Follow-up    blood in stools, GERD    HPI: Raymond [name] is a 53 y.o. male here for follow-up of intermittent bright red blood per rectum.  He states he was not able to fill his Anusol suppositories as they cost too much.  Symptoms have improved but continue about once or twice a week.  Denies any pain with bowel movements.  Patient has had previous work-up with EGD and colonoscopy in 2017 at [facility].  At that time he reported hematochezia as well.  4 subcentimeter polyps were removed and were hyperplastic.  Internal nonbleeding hemorrhoids were reported.  EGD showed grade B reflux esophagitis.  Esophageal mucosal changes suspicious for Barrett's were also reported.  Pathology report showed no intestinal metaplasia.  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  Current Outpatient Medications  Medication Sig Dispense Refill  . ACCU-CHEK FASTCLIX LANCETS MISC yse as directed twice a day 100 each 1  . albuterol (PROVENTIL HFA;VENTOLIN HFA) 108 (90 Base) MCG/ACT inhaler Inhale 2 puffs into the lungs every 4 (four) hours as needed for wheezing or shortness of breath. 1 Inhaler 3  . aspirin EC 81 MG tablet Take 81 mg by mouth.    Marland Kitchen atorvastatin (LIPITOR) 80 MG tablet Take by mouth.    . famotidine (PEPCID) 20 MG tablet Take 1 tablet (20 mg total) by mouth 2 (two) times daily. 60 tablet 3  . fluticasone (FLONASE) 50 MCG/ACT nasal spray 2 sprays by Each Nare route daily.    Marland Kitchen glucose blood (ACCU-CHEK AVIVA PLUS) test strip Blood sugar testing TID and as needed. E11.65 100 each 12  . hydrochlorothiazide (HYDRODIURIL) 25 MG tablet Take 25 mg by mouth daily.     . hydrOXYzine (VISTARIL) 50 MG capsule Take 50 mg by mouth 2 (two) times daily as needed.    . insulin  aspart (NOVOLOG FLEXPEN) 100 UNIT/ML FlexPen Use as directed per sliding scale. Instructions provided. 15 mL 11  . Insulin Glargine (BASAGLAR KWIKPEN) 100 UNIT/ML SOPN Inject up to 60 units [place] QD 5 pen 5  . metoprolol tartrate (LOPRESSOR) 100 MG tablet Take 1 tablet (100 mg total) by mouth 2 (two) times daily. 180 tablet 1  . mirtazapine (REMERON) 45 MG tablet Take 1 tablet (45 mg total) by mouth at bedtime. 30 tablet 1  . nitroGLYCERIN (NITROSTAT) 0.4 MG SL tablet Place 0.4 mg under the tongue every 5 (five) minutes as needed for chest pain.    . pantoprazole (PROTONIX) 40 MG tablet Take 1 tablet (40 mg total) by mouth daily. 30 tablet 3  . QUEtiapine (SEROQUEL) 50 MG tablet Take 1 tablet (50 mg total) by mouth 2 (two) times daily as needed. 60 tablet 1  . simvastatin (ZOCOR) 40 MG tablet Take 40 mg by mouth at bedtime.    . sitaGLIPtin-metformin (JANUMET) 50-500 MG tablet Take 1 tablet by mouth 2 (two) times daily. 180 tablet 1  . Suvorexant (BELSOMRA) 10 MG TABS Take 10 mg by mouth at bedtime. 30 tablet 0   No current facility-administered medications for this visit.     Allergies as of 06/10/2018 - Review Complete 06/10/2018  Allergen Reaction Noted  . Onion Anaphylaxis 03/30/2015  . Hydrocodone  01/13/2014  . Norco [hydrocodone-acetaminophen] Itching 11/02/2014  .  Hydrocodone-acetaminophen Itching 11/02/2014    ROS:  General: Negative for anorexia, weight loss, fever, chills, fatigue, weakness. ENT: Negative for hoarseness, difficulty swallowing , nasal congestion. CV: Negative for chest pain, angina, palpitations, dyspnea on exertion, peripheral edema.  Respiratory: Negative for dyspnea at rest, dyspnea on exertion, cough, sputum, wheezing.  GI: See history of present illness. GU:  Negative for dysuria, hematuria, urinary incontinence, urinary frequency, nocturnal urination.  Endo: Negative for unusual weight change.    Physical Examination:   BP (!) 149/87   Pulse 88   Ht  5' 9"  (1.753 m)   Wt 256 lb (116.1 kg)   BMI 37.80 kg/m   General: Well-nourished, well-developed in no acute distress.  Eyes: No icterus. Conjunctivae pink. Mouth: Oropharyngeal mucosa moist and pink , no lesions erythema or exudate. Neck: Supple, Trachea midline Abdomen: Bowel sounds are normal, nontender, nondistended, no hepatosplenomegaly or masses, no abdominal bruits or hernia , no rebound or guarding.   Extremities: No lower extremity edema. No clubbing or deformities. Neuro: Alert and oriented x 3.  Grossly intact. Skin: Warm and dry, no jaundice.   Psych: Alert and cooperative, normal mood and affect.   Labs: CMP     Component Value Date/Time   NA 131 (L) 03/01/2018 1244   NA 138 07/15/2015 1832   NA 138 11/28/2013 1506   K 4.5 03/01/2018 1244   K 3.7 11/28/2013 1506   CL 98 03/01/2018 1244   CL 105 11/28/2013 1506   CO2 21 (L) 03/01/2018 1244   CO2 26 11/28/2013 1506   GLUCOSE 547 (HH) 03/01/2018 1244   GLUCOSE 163 (H) 11/28/2013 1506   BUN 18 03/01/2018 1244   BUN 12 07/15/2015 1832   BUN 12 11/28/2013 1506   CREATININE 1.00 05/21/2018 1537   CREATININE 1.25 11/28/2013 1506   CALCIUM 9.1 03/01/2018 1244   CALCIUM 8.5 11/28/2013 1506   PROT 8.3 06/10/2018 1129   PROT 8.0 12/08/2012 0914   ALBUMIN 4.5 06/10/2018 1129   ALBUMIN 4.1 12/08/2012 0914   AST 87 (H) 06/10/2018 1129   AST 28 12/08/2012 0914   ALT 79 (H) 06/10/2018 1129   ALT 42 12/08/2012 0914   ALKPHOS 153 (H) 06/10/2018 1129   ALKPHOS 118 12/08/2012 0914   BILITOT <0.2 06/10/2018 1129   BILITOT 0.3 12/08/2012 0914   GFRNONAA >60 03/01/2018 1244   GFRNONAA >60 11/28/2013 1506   GFRAA >60 03/01/2018 1244   GFRAA >60 11/28/2013 1506   Lab Results  Component Value Date   WBC 7.9 03/01/2018   HGB 14.5 05/15/2018   HCT 42.3 03/01/2018   MCV 81.5 03/01/2018   PLT 221 03/01/2018    Imaging Studies: Dg Eye Foreign Body  Result Date: 05/21/2018 CLINICAL DATA:  Metal working/exposure;  clearance prior to MRI EXAM: ORBITS FOR FOREIGN BODY - 2 VIEW COMPARISON:  None. FINDINGS: There is no evidence of metallic foreign body within the orbits. No significant bone abnormality identified. IMPRESSION: No evidence of metallic foreign body within the orbits. Electronically Signed   By: Marijo Conception, M.D.   On: 05/21/2018 15:23   Mr Jeri Cos FO Contrast  Result Date: 05/21/2018 CLINICAL DATA:  Initial evaluation for headache and neck pain for 3-4 weeks, double vision. EXAM: MRI HEAD WITHOUT AND WITH CONTRAST TECHNIQUE: Multiplanar, multiecho pulse sequences of the brain and surrounding structures were obtained without and with intravenous contrast. CONTRAST:  10 cc of Gadavist. COMPARISON:  Prior head CT from 10/23/2009. FINDINGS: Brain: Mildly advanced cerebral  atrophy with chronic small vessel ischemic disease. No abnormal foci of restricted diffusion to suggest acute or subacute ischemia. Gray-white matter differentiation well maintained. No encephalomalacia to suggest chronic infarction. No foci of susceptibility artifact to suggest acute or chronic intracranial hemorrhage. Mass lesion, midline shift or mass effect. No hydrocephalus. No extra-axial fluid collection. Major dural sinuses are grossly patent. Pituitary gland and suprasellar region are normal. Midline structures intact and normal. No abnormal enhancement. Vascular: Major intracranial vascular flow voids well maintained and normal in appearance. Skull and upper cervical spine: Craniocervical junction normal. Visualized upper cervical spine within normal limits. Bone marrow signal intensity normal. No scalp soft tissue abnormality. Sinuses/Orbits: Globes and orbital soft tissues within normal limits. Mild scattered mucosal thickening within the ethmoidal air cells and maxillary sinuses. Paranasal sinuses are otherwise largely clear. No mastoid effusion. No mastoid effusion. Inner ear structures normal. Other: None. IMPRESSION: 1. No acute  intracranial abnormality. 2. Mild cerebral atrophy with chronic small vessel ischemic disease. Electronically Signed   By: Jeannine Boga M.D.   On: 05/21/2018 23:16    Assessment and Plan:   Raymond [name] is a 53 y.o. y/o male here for follow-up of intermittent bright red blood per rectum  Patient was unable to fill Anusol suppositories due to their cost He is interested in referral for banding with Dr. Marius Ditch.  This appointment has been made for March  High-fiber diet MiraLAX or Metamucil daily with goal of 1-2 soft bowel movements daily.  If not at goal, patient instructed to increase dose to twice daily.  If loose stools with the medication, patient asked to decrease the medication to every other day, or half dose daily.  Patient verbalized understanding  His colonoscopy is recent therefore risk of malignancy is low  Given his finding of hepatic steatosis in March 2019 we discussed work-up for fatty liver This was done today and shows that he is immune to hepatitis A but not to hepatitis B.  No previous evidence of hepatitis C.  His transaminases and alk phos are elevated.  However, his total bilirubin is normal.  This is likely due to underlying fatty liver, however given his elevated alk phos will obtain right upper quadrant ultrasound for further evaluation.  We will also order autoimmune work-up at this time.  Finding of fatty liver on imaging discussed with patient Diet, weight loss, and exercise encouraged along with avoiding hepatotoxic drugs including alcohol Risk of progression to cirrhosis if above measures are not instituted were discussed as well, and patient verbalized understanding  Patient would also like referral to a surgeon due to his abdominal wall hernia, will refer.   Dr [name]

## 2018-06-11 NOTE — Addendum Note (Signed)
Addended by: [name] on: 06/11/2018 04:32 PM   Modules accepted: Orders

## 2018-06-12 ENCOUNTER — Telehealth: Payer: Self-pay

## 2018-06-12 ENCOUNTER — Other Ambulatory Visit: Payer: Self-pay

## 2018-06-12 DIAGNOSIS — R748 Abnormal levels of other serum enzymes: Secondary | ICD-10-CM

## 2018-06-12 NOTE — Telephone Encounter (Signed)
Pt informed of elevated liver enzymes and need of right upper quad ultrasound. Also aware of need for further blood work up (labs). Informed to contact tomorrow for hours in case of bad weather and office closing. Pt inquired about hep A lab results, which were positive. I informed pt that I will contact Dr. Bonna Gains to confirm what thi means. Will contact pt in regards to ultrasound appt.  Ultrasound scheduled: 06/20/2018 at [place] at 10:15 at the medical mall entrance and ultrasound to be done at 10:30 am. Nothing to eat or drink after midnight. Pt informed of ultrasound appt. Will send lab times for office via my chart.

## 2018-06-12 NOTE — Telephone Encounter (Signed)
-----   Message from Virgel Manifold, MD sent at 06/11/2018  4:33 PM EST ----- Your liver enzymes were elevated.  We need to obtain a right upper quadrant ultrasound, Robson Trickey please order.  I have also ordered more blood work to evaluate this further.

## 2018-06-12 NOTE — Addendum Note (Signed)
Addended by: [name] on: 06/12/2018 04:49 PM   Modules accepted: Orders

## 2018-06-13 ENCOUNTER — Ambulatory Visit: Payer: Medicare Other | Admitting: Psychiatry

## 2018-06-18 ENCOUNTER — Other Ambulatory Visit: Payer: Self-pay

## 2018-06-18 DIAGNOSIS — K219 Gastro-esophageal reflux disease without esophagitis: Secondary | ICD-10-CM

## 2018-06-18 MED ORDER — PANTOPRAZOLE SODIUM 40 MG PO TBEC: 40.0000 mg | DELAYED_RELEASE_TABLET | Freq: Every day | ORAL | 3 refills | Status: DC

## 2018-06-19 ENCOUNTER — Other Ambulatory Visit: Payer: Self-pay

## 2018-06-19 ENCOUNTER — Ambulatory Visit (INDEPENDENT_AMBULATORY_CARE_PROVIDER_SITE_OTHER): Payer: Medicare Other | Admitting: Surgery

## 2018-06-19 ENCOUNTER — Encounter: Payer: Self-pay | Admitting: Surgery

## 2018-06-19 VITALS — BP 148/88 | HR 111 | Temp 98.1°F | Resp 16 | Ht 69.0 in | Wt 254.0 lb

## 2018-06-19 DIAGNOSIS — K760 Fatty (change of) liver, not elsewhere classified: Secondary | ICD-10-CM

## 2018-06-19 DIAGNOSIS — M6208 Separation of muscle (nontraumatic), other site: Secondary | ICD-10-CM

## 2018-06-19 HISTORY — DX: Separation of muscle (nontraumatic), other site: M62.08

## 2018-06-19 NOTE — Patient Instructions (Signed)
Follow up as needed. Be sure to follow up with your primary care provider for your diabetes.     Diastasis Recti  Diastasis recti is when the muscles of the abdomen (rectus abdominis muscles) become thin and separate. The result is a wider space between the right and left abdomen (abdominal) muscles. This wider space between the muscles may cause a bulge in the middle of your abdomen. You may notice this bulge when you are straining or when you sit up from a lying down position. Diastasis recti can affect men and women. It is most common among pregnant women, infants, people who are obese, and people who have had abdominal surgery. Exercise or surgical treatment may help correct it. What are the causes? Common causes of this condition include:  Pregnancy. The growing uterus puts pressure on the abdominal muscles, which causes the muscles to separate.  Obesity. Excess fat puts pressure on abdominal muscles.  Weightlifting.  Some abdomen exercises.  Advanced age.  Genetics.  Prior abdominal surgery. What increases the risk? This condition is more likely to develop in:  Women.  Newborns, especially newborns who are born early (prematurely). What are the signs or symptoms? Common symptoms of this condition include:  A bulge in the middle of the abdomen. You will notice it most when you sit up or strain.  Pain in the low back, pelvis, or hips.  Constipation.  Inability to control when you urinate (urinary incontinence).  Bloating.  Poor posture. How is this diagnosed? This condition is diagnosed with a physical exam. Your health care provider will ask you to lie flat on your back and do a crunch or half sit-up. If you have diastasis recti, a vertical bulge will appear between your abdominal muscles in the center of your abdomen. Your health care provider will measure the gap between your muscles with one of the following:  A medical device used to measure the space between  two objects (caliper).  A tape measure.  CT scan.  Ultrasound.  Finger spaces. Your health care provider will measure the space using their fingers. How is this treated? If your muscle separation is not too large, you may not need treatment. However, if you are a woman who plans to become pregnant again, you should treat this condition before your next pregnancy. Treatment may include:  Physical therapy to strengthen and tighten your abdominal muscles.  Lifestyle changes such as weight loss and exercise.  Over-the-counter pain medicines as needed.  Surgery to correct the separation. Follow these instructions at home: Activity  Return to your normal activities as told by your health care provider. Ask your health care provider what activities are safe for you.  When lifting weights or doing exercises using your abdominal muscles or the muscles in the center of your body that give stability (core muscles), make sure you are doing your exercises and movements correctly. Proper form can help to prevent the condition from happening again. General instructions  If you are overweight, ask your health care provider for help with weight loss. Losing even a small amount of weight can help to improve your diastasis recti.  Take over-the-counter or prescription medicines only as told by your health care provider.  Do not strain. Straining can make the separation worse. Examples of straining include: ? Pushing hard to have a bowel movement, such as due to constipation. ? Lifting heavy objects, including children. ? Standing up and sitting down.  Take steps to prevent constipation: ? Drink enough  fluid to keep your urine clear or pale yellow. ? Take over-the-counter or prescription medicines only as directed. ? Eat foods that are high in fiber, such as fresh fruits and vegetables, whole grains, and beans. ? Limit foods that are high in fat and processed sugars, such as fried and sweet  foods. Contact a health care provider if:  You notice a new bulge in your abdomen. Get help right away if:  You experience severe discomfort in your abdomen.  You develop severe abdominal pain along with nausea, vomiting, or fever. Summary  Diastasis recti is when the abdomen (abdominal) muscles become thin and separate. Your abdomen will stick out because the space between your right and left abdomen muscles has widened.  The most common symptom is a bulge in your abdomen. You will notice it most when you sit up or are straining.  This condition is diagnosed during a physical exam.  If the abdomen separation is not too big, you may choose not to have treatment. Otherwise, you may need to undergo physical therapy or surgery. This information is not intended to replace advice given to you by your health care provider. Make sure you discuss any questions you have with your health care provider. Document Released: 06/05/2016 Document Revised: 06/05/2016 Document Reviewed: 06/05/2016 Elsevier Interactive Patient Education  [employer].
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
## 2018-06-20 ENCOUNTER — Ambulatory Visit
Admission: RE | Admit: 2018-06-20 | Discharge: 2018-06-20 | Disposition: A | Discharge status: Home / Self Care | Payer: Medicare Other | Source: Ambulatory Visit | Attending: Gastroenterology | Admitting: Gastroenterology

## 2018-06-20 DIAGNOSIS — R748 Abnormal levels of other serum enzymes: Secondary | ICD-10-CM | POA: Insufficient documentation

## 2018-06-20 DIAGNOSIS — K760 Fatty (change of) liver, not elsewhere classified: Secondary | ICD-10-CM | POA: Diagnosis not present

## 2018-06-20 LAB — MITOCHONDRIAL/SMOOTH MUSCLE AB PNL
Mitochondrial Ab: <20 Units (ref 0.0–20.0)
Smooth Muscle Ab: 11 Units (ref 0–19)

## 2018-06-20 LAB — CERULOPLASMIN: CERULOPLASMIN: 35 mg/dL — AB (ref 16.0–31.0)

## 2018-06-20 LAB — FERRITIN: Ferritin: 468 ng/mL — ABNORMAL HIGH (ref 30–400)

## 2018-06-21 ENCOUNTER — Ambulatory Visit (INDEPENDENT_AMBULATORY_CARE_PROVIDER_SITE_OTHER): Payer: Medicare Other | Admitting: Psychiatry

## 2018-06-21 ENCOUNTER — Telehealth: Payer: Self-pay

## 2018-06-21 ENCOUNTER — Other Ambulatory Visit: Payer: Self-pay

## 2018-06-21 ENCOUNTER — Encounter: Payer: Self-pay | Admitting: Psychiatry

## 2018-06-21 VITALS — BP 150/94 | HR 82 | Ht 69.0 in | Wt 254.0 lb

## 2018-06-21 DIAGNOSIS — F4310 Post-traumatic stress disorder, unspecified: Secondary | ICD-10-CM

## 2018-06-21 DIAGNOSIS — F5105 Insomnia due to other mental disorder: Secondary | ICD-10-CM | POA: Diagnosis not present

## 2018-06-21 DIAGNOSIS — E11649 Type 2 diabetes mellitus with hypoglycemia without coma: Secondary | ICD-10-CM

## 2018-06-21 DIAGNOSIS — F331 Major depressive disorder, recurrent, moderate: Secondary | ICD-10-CM

## 2018-06-21 LAB — HCV RNA QUANT: Hepatitis C Quantitation: NOT DETECTED IU/mL

## 2018-06-21 LAB — HEPATITIS B CORE ANTIBODY, IGM: Hep B C IgM: NEGATIVE

## 2018-06-21 LAB — HEPATITIS A ANTIBODY, IGM: HEP A IGM: NEGATIVE

## 2018-06-21 MED ORDER — SUVOREXANT 15 MG PO TABS: 15.0000 mg | ORAL_TABLET | Freq: Every day | ORAL | 0 refills | Status: DC

## 2018-06-21 MED ORDER — BASAGLAR KWIKPEN 100 UNIT/ML ~~LOC~~ SOPN: PEN_INJECTOR | SUBCUTANEOUS | 5 refills | Status: DC

## 2018-06-21 NOTE — Telephone Encounter (Signed)
Pt notified. He had noticed his Ceruloplasmin elevated but I did not see a comment from you about this and I told pt I would check in with you about this.

## 2018-06-21 NOTE — Addendum Note (Signed)
Addended by: [name] on: 06/21/2018 11:21 AM   Modules accepted: Orders

## 2018-06-21 NOTE — Progress Notes (Signed)
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  Patient today reports he continues to struggle with sleep.  He tried the Belsomra which helps for a few hours.  He however wakes up after that.  Discussed readjusting the dose of Belsomra.  Also discussed combining melatonin low dose.  He can buy it over-the-counter.  Patient reports the mirtazapine and Seroquel is helpful for his mood symptoms.  He continues to have some psychosocial stressors of health issues including recent diagnosis of rectal diastasis  Discussed with patient to continue psychotherapy sessions.  Patient denies any other concerns today. Visit Diagnosis:    ICD-10-CM   1. PTSD (post-traumatic stress disorder) F43.10   2. MDD (major depressive disorder), recurrent episode, moderate (HCC) F33.1   3. Insomnia due to mental disorder F51.05 Suvorexant (BELSOMRA) 15 MG TABS    Past Psychiatric History: I have reviewed past psychiatric history from my progress note on 05/23/2018.  Past trials of Prozac, Zoloft, Celexa, Tegretol, doxepin, Klonopin, Seroquel, mirtazapine, Lunesta, Ambien  Past Medical History:  Past Medical History:  Diagnosis Date  . Anxiety   . Depression   . Diabetes mellitus without complication ([place])   . 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 GERD (gastroesophageal reflux disease)   . Headache   . Hyperlipidemia   . Hypertension   . Irregular heart beat unk  . Myocardial infarction ([place])   . PTSD (post-traumatic stress disorder)   . Tachycardia     Past Surgical History:  Procedure Laterality Date  .   ingrown toenail removal    . CLAVICLE SURGERY Right   . COLONOSCOPY  2017  . SHOULDER SURGERY Right   . UPPER GI ENDOSCOPY  2017    Family Psychiatric History: Reviewed family psychiatric history from my progress note on 05/23/2018 Family History:  Family History  Problem Relation Age of Onset  . Hypertension Mother   . Diabetes type II Mother   . Diabetes Mother   . COPD Father   . Cancer Father   . Heart disease Father   . Diabetes Sister   . Anxiety disorder Sister   . Depression Sister   . Diabetes Brother   . Anxiety disorder Brother   . Depression Brother   . Diabetes Maternal Aunt   . Diabetes Sister   . Anxiety disorder Sister   . Depression Sister   . Diabetes Sister   . Anxiety disorder Sister   . Depression Sister   . Diabetes Sister   . Anxiety disorder Sister   . Depression Sister     Social History: Reviewed social history from my progress note on 05/23/2018 Social History   Socioeconomic History  . Marital status: Divorced    Spouse name: Not on file  . Number of children: 4  . Years of education: Not on file  . Highest education level: 8th grade  Occupational History  . Not on file  Social Needs  . Financial resource strain: Very hard  . Food insecurity:    Worry: Often true    Inability: Often true  . Transportation needs:  Medical: No    Non-medical: No  Tobacco Use  . Smoking status: Current Every Day Smoker    Packs/day: 2.00    Years: 40.00    Pack years: 80.00    Types: Cigarettes  . Smokeless tobacco: Former Systems developer    Types: Chew  . Tobacco comment: Reports has tried multiple things to help quit and cut back.   Substance and Sexual Activity  . Alcohol use: No    Alcohol/week: 0.0 standard drinks    Frequency: Never  . Drug use: No  . Sexual activity: Not Currently  Lifestyle  . Physical activity:    Days per week: 0 days    Minutes per session: 0 min  . Stress: Very much  Relationships  . Social connections:    Talks on  phone: Not on file    Gets together: Not on file    Attends religious service: Never    Active member of club or organization: No    Attends meetings of clubs or organizations: Never    Relationship status: Divorced  Other Topics Concern  . Not on file  Social History Narrative  . Not on file    Allergies:  Allergies  Allergen Reactions  . Onion Anaphylaxis  . Hydrocodone   . Norco [Hydrocodone-Acetaminophen] Itching  . Hydrocodone-Acetaminophen Itching    Metabolic Disorder Labs: Lab Results  Component Value Date   HGBA1C 14.2 (A) 04/01/2018   MPG 294.83 05/31/2017   No results found for: PROLACTIN Lab Results  Component Value Date   CHOL 134 07/15/2015   TRIG 227 (H) 07/15/2015   HDL 28 (L) 07/15/2015   CHOLHDL 4.8 07/15/2015   VLDL 26 12/09/2012   LDLCALC 61 07/15/2015   LDLCALC 96 12/09/2012   Lab Results  Component Value Date   TSH 4.120 06/10/2018   TSH 2.000 07/15/2015    Therapeutic Level Labs: No results found for: LITHIUM No results found for: VALPROATE No components found for:  CBMZ  Current Medications: Current Outpatient Medications  Medication Sig Dispense Refill  . ACCU-CHEK FASTCLIX LANCETS MISC yse as directed twice a day 100 each 1  . albuterol (PROVENTIL HFA;VENTOLIN HFA) 108 (90 Base) MCG/ACT inhaler Inhale 2 puffs into the lungs every 4 (four) hours as needed for wheezing or shortness of breath. 1 Inhaler 3  . aspirin EC 81 MG tablet Take 81 mg by mouth.    . famotidine (PEPCID) 20 MG tablet Take 1 tablet (20 mg total) by mouth 2 (two) times daily. 60 tablet 3  . glucose blood (ACCU-CHEK AVIVA PLUS) test strip Blood sugar testing TID and as needed. E11.65 100 each 12  . hydrOXYzine (VISTARIL) 50 MG capsule Take 50 mg by mouth 2 (two) times daily as needed.    . insulin aspart (NOVOLOG FLEXPEN) 100 UNIT/ML FlexPen Use as directed per sliding scale. Instructions provided. 15 mL 11  . metoprolol tartrate (LOPRESSOR) 100 MG tablet Take 1  tablet (100 mg total) by mouth 2 (two) times daily. 180 tablet 1  . mirtazapine (REMERON) 45 MG tablet Take 1 tablet (45 mg total) by mouth at bedtime. 30 tablet 1  . nitroGLYCERIN (NITROSTAT) 0.4 MG SL tablet Place 0.4 mg under the tongue every 5 (five) minutes as needed for chest pain.    Marland Kitchen QUEtiapine (SEROQUEL) 50 MG tablet Take 1 tablet (50 mg total) by mouth 2 (two) times daily as needed. 60 tablet 1  . simvastatin (ZOCOR) 40 MG tablet Take 40 mg by mouth at  bedtime.    . sitaGLIPtin-metformin (JANUMET) 50-500 MG tablet Take 1 tablet by mouth 2 (two) times daily. 180 tablet 1  . fluticasone (FLONASE) 50 MCG/ACT nasal spray 2 sprays by Each Nare route daily.    . hydrochlorothiazide (HYDRODIURIL) 25 MG tablet Take 25 mg by mouth daily.     . Insulin Glargine (BASAGLAR KWIKPEN) 100 UNIT/ML SOPN Inject up to 60 units [place] QD 5 pen 5  . Suvorexant (BELSOMRA) 15 MG TABS Take 15 mg by mouth at bedtime. 30 tablet 0   No current facility-administered medications for this visit.      Musculoskeletal: Strength & Muscle Tone: within normal limits Gait & Station: normal Patient leans: N/A  Psychiatric Specialty Exam: Review of Systems  Psychiatric/Behavioral: The patient is nervous/anxious and has insomnia.   All other systems reviewed and are negative.   Blood pressure (!) 150/94, pulse 82, height 5\' 9"  (1.753 m), weight 254 lb (115.2 kg).Body mass index is 37.51 kg/m.  General Appearance: Casual  Eye Contact:  Fair  Speech:  Normal Rate  Volume:  Normal  Mood:  Anxious  Affect:  Congruent  Thought Process:  Goal Directed and Descriptions of Associations: Intact  Orientation:  Full (Time, Place, and Person)  Thought Content: Logical   Suicidal Thoughts:  No  Homicidal Thoughts:  No  Memory:  Immediate;   Fair Recent;   Fair Remote;   Fair  Judgement:  Fair  Insight:  Fair  Psychomotor Activity:  Normal  Concentration:  Concentration: Fair and Attention Span: Fair  Recall:  [employer] of Knowledge: Fair  Language: Fair  Akathisia:  No  Handed:  Right  AIMS (if indicated): 0  Assets:  Communication Skills Desire for Improvement Social Support  ADL's:  Intact  Cognition: WNL  Sleep:  restless   Screenings: Mini-Mental     Clinical Support from 10/05/2017 in [facility], [facility]  Total Score (max 30 points )  30    PHQ2-9     Office Visit from 04/01/2018 in [facility], [facility] Office Visit from 12/31/2017 in [facility], [facility] Office Visit from 10/23/2017 in [facility], [place] from 10/05/2017 in [facility], [facility] Nutrition from 07/06/2017 in [place]  PHQ-2 Total Score  0  0  5  0  4  PHQ-9 Total Score  -  -  20  -  16       Assessment and Plan: Elza is a 53 yr old  male who is on disability, lives in [place], divorced, has a history of PTSD, MDD, insomnia, uncontrolled diabetes melitis, sixth cranial palsy, vision loss, hyperlipidemia, presented to clinic today for a follow-up visit.  Patient is biologically predisposed given his multiple medical problems, history of trauma.  Patient also has a history of suicide attempts however currently denies it.  Patient continues to struggle with some anxiety symptoms due to his current health problems.  He also has sleep issues.  We will continue to make medication changes.  Plan MDD- improving Seroquel 50 mg p.o. twice daily as needed Mirtazapine 45 mg p.o. nightly Continue CBT.  PTSD- improving Increase Belsomra to 15 mg p.o. nightly Add melatonin.  He will buy it over-the-counter. Discussed sleep hygiene techniques, printed out handouts and gave to patient. Continue CBT  For Insomnia- unstable Increase Belsomra as discussed above.  Follow-up in clinic in 4 weeks or sooner if needed.  I have spent atleast 15 minutes face  to face with patient today. More than 50 % of the time was spent for psychoeducation and  supportive psychotherapy and care coordination.  This note was generated in part or whole with voice recognition software. Voice recognition is usually quite accurate but there are transcription errors that can and very often do occur. I apologize for any typographical errors that were not detected and corrected.        Ursula Alert, MD 06/21/2018, 12:47 PM

## 2018-06-21 NOTE — Telephone Encounter (Signed)
Pt came in office today stating that he was completely out of his insulin, gave pt one sample of basaglar and sent in a refill for his prescription.

## 2018-06-21 NOTE — Telephone Encounter (Signed)
-----   Message from Virgel Manifold, MD sent at 06/21/2018 11:24 AM EST ----- Your labwork showed that you are immune to Hepatitis A, but not B. You should get vaccination for Hep B at your PCP office. I am copying them on this note.  Your iron stores were elevated, but this does not been that your liver enzymes are elevated due to high iron stores. I have ordered further labwork to evaluate this. Please have this done at [facility]. Please call our office to find out the hours for [facility] at our office.   Your ultrasound showed fatty liver.

## 2018-06-24 ENCOUNTER — Other Ambulatory Visit: Payer: Self-pay

## 2018-06-24 DIAGNOSIS — K760 Fatty (change of) liver, not elsewhere classified: Secondary | ICD-10-CM

## 2018-06-25 LAB — IRON AND TIBC
Iron Saturation: 28 % (ref 15–55)
Iron: 82 ug/dL (ref 38–169)
Total Iron Binding Capacity: 290 ug/dL (ref 250–450)
UIBC: 208 ug/dL (ref 111–343)

## 2018-06-26 ENCOUNTER — Other Ambulatory Visit: Payer: Self-pay

## 2018-06-26 DIAGNOSIS — K219 Gastro-esophageal reflux disease without esophagitis: Secondary | ICD-10-CM

## 2018-06-26 MED ORDER — FAMOTIDINE 20 MG PO TABS: 20.0000 mg | ORAL_TABLET | Freq: Two times a day (BID) | ORAL | 3 refills | Status: DC

## 2018-06-27 ENCOUNTER — Telehealth: Payer: Self-pay

## 2018-06-27 DIAGNOSIS — E11649 Type 2 diabetes mellitus with hypoglycemia without coma: Secondary | ICD-10-CM

## 2018-06-27 NOTE — Telephone Encounter (Signed)
Do you know which long acting insulin is preferred by his insurance? I can send new prescription when I know which is right. Thanks

## 2018-06-28 ENCOUNTER — Other Ambulatory Visit: Payer: Self-pay | Admitting: Nurse Practitioner

## 2018-06-28 DIAGNOSIS — E1165 Type 2 diabetes mellitus with hyperglycemia: Secondary | ICD-10-CM

## 2018-06-28 MED ORDER — INSULIN GLARGINE (2 UNIT DIAL) 300 UNIT/ML ~~LOC~~ SOPN: 60.0000 [IU] | PEN_INJECTOR | Freq: Every day | SUBCUTANEOUS | 5 refills | Status: DC

## 2018-06-28 NOTE — Telephone Encounter (Signed)
Changed basaglar to toujeo 60 units every day. Sent new prescription to [facility].

## 2018-06-28 NOTE — Progress Notes (Signed)
Changed basaglar to toujeo 60 units every day. Sent new prescription to [facility].

## 2018-06-28 NOTE — Telephone Encounter (Signed)
Looks like Lantus , Levemir , toujeo and Tyler Aas are on formulary

## 2018-07-01 LAB — HEMOCHROMATOSIS DNA-PCR(C282Y,H63D)

## 2018-07-01 NOTE — Progress Notes (Signed)
Your genetic testing showed that you do not have the complete mutation associated with hemochromatosis (iron overload in the liver).  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  I will refer you to hematology to evaluate if you will benefit from phlebotomy, to correct the possible iron overload.  Debbie, please refer to Dr. Tasia Catchings for elevated ferritin.

## 2018-07-03 ENCOUNTER — Ambulatory Visit: Payer: Medicare Other | Admitting: Licensed Clinical Social Worker

## 2018-07-03 ENCOUNTER — Other Ambulatory Visit: Payer: Self-pay

## 2018-07-03 ENCOUNTER — Telehealth: Payer: Self-pay

## 2018-07-03 ENCOUNTER — Encounter: Payer: Self-pay | Admitting: Licensed Clinical Social Worker

## 2018-07-03 DIAGNOSIS — F4310 Post-traumatic stress disorder, unspecified: Secondary | ICD-10-CM | POA: Diagnosis not present

## 2018-07-03 DIAGNOSIS — R7989 Other specified abnormal findings of blood chemistry: Secondary | ICD-10-CM

## 2018-07-03 NOTE — Telephone Encounter (Signed)
It showed fatty liver. A stone in his gallbladder was seen too but these do not need surgery at this time. Weight loss with diet and exercise and avoiding daily or excessive alcohol use will help reverse fatty liver

## 2018-07-03 NOTE — Telephone Encounter (Signed)
Pt.notified

## 2018-07-03 NOTE — Telephone Encounter (Signed)
Pt notified of need of referral for elevated ferritin level to Dr. Tasia Catchings. If he has not heard anything in a week to let us know.  Also pt questioned the result of his ultrasound and I can't see where you have reviewed this. (?default provider). Please advise.

## 2018-07-03 NOTE — Telephone Encounter (Signed)
-----   Message from Virgel Manifold, MD sent at 07/01/2018  3:35 PM EDT ----- Your genetic testing showed that you do not have the complete mutation associated with hemochromatosis (iron overload in the liver).  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  I will refer you to hematology to evaluate if you will benefit from phlebotomy, to correct the possible iron overload.  Chayna Surratt, please refer to Dr. Tasia Catchings for elevated ferritin.
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
## 2018-07-04 ENCOUNTER — Encounter: Payer: Self-pay | Admitting: Nurse Practitioner

## 2018-07-04 ENCOUNTER — Ambulatory Visit (INDEPENDENT_AMBULATORY_CARE_PROVIDER_SITE_OTHER): Payer: Medicare Other | Admitting: Nurse Practitioner

## 2018-07-04 VITALS — BP 137/91 | HR 92 | Resp 16 | Ht 69.0 in | Wt 259.8 lb

## 2018-07-04 DIAGNOSIS — K219 Gastro-esophageal reflux disease without esophagitis: Secondary | ICD-10-CM

## 2018-07-04 DIAGNOSIS — I10 Essential (primary) hypertension: Secondary | ICD-10-CM | POA: Diagnosis not present

## 2018-07-04 DIAGNOSIS — E1165 Type 2 diabetes mellitus with hyperglycemia: Secondary | ICD-10-CM | POA: Diagnosis not present

## 2018-07-04 LAB — POCT GLYCOSYLATED HEMOGLOBIN (HGB A1C): Hemoglobin A1C: 10.5 % — AB (ref 4.0–5.6)

## 2018-07-08 ENCOUNTER — Other Ambulatory Visit: Payer: Self-pay

## 2018-07-08 ENCOUNTER — Encounter: Payer: Self-pay | Admitting: Emergency Medicine

## 2018-07-08 DIAGNOSIS — F17210 Nicotine dependence, cigarettes, uncomplicated: Secondary | ICD-10-CM | POA: Insufficient documentation

## 2018-07-08 DIAGNOSIS — Z79899 Other long term (current) drug therapy: Secondary | ICD-10-CM | POA: Insufficient documentation

## 2018-07-08 DIAGNOSIS — J069 Acute upper respiratory infection, unspecified: Secondary | ICD-10-CM | POA: Diagnosis not present

## 2018-07-08 DIAGNOSIS — J40 Bronchitis, not specified as acute or chronic: Secondary | ICD-10-CM | POA: Insufficient documentation

## 2018-07-08 DIAGNOSIS — J209 Acute bronchitis, unspecified: Secondary | ICD-10-CM | POA: Diagnosis not present

## 2018-07-08 DIAGNOSIS — Z794 Long term (current) use of insulin: Secondary | ICD-10-CM | POA: Diagnosis not present

## 2018-07-08 DIAGNOSIS — J449 Chronic obstructive pulmonary disease, unspecified: Secondary | ICD-10-CM | POA: Insufficient documentation

## 2018-07-08 DIAGNOSIS — R05 Cough: Secondary | ICD-10-CM | POA: Diagnosis not present

## 2018-07-08 DIAGNOSIS — E119 Type 2 diabetes mellitus without complications: Secondary | ICD-10-CM | POA: Insufficient documentation

## 2018-07-08 DIAGNOSIS — I10 Essential (primary) hypertension: Secondary | ICD-10-CM | POA: Diagnosis not present

## 2018-07-08 LAB — INFLUENZA PANEL BY PCR (TYPE A & B)
Influenza A By PCR: NEGATIVE
Influenza B By PCR: NEGATIVE

## 2018-07-08 NOTE — ED Triage Notes (Signed)
Patient ambulatory to triage with steady gait, without difficulty or distress noted, mask in place; pt reports nonprod cough x 3-4 days with sinus drainage and fever

## 2018-07-09 ENCOUNTER — Emergency Department: Payer: Medicare Other

## 2018-07-09 ENCOUNTER — Emergency Department
Admission: EM | Admit: 2018-07-09 | Discharge: 2018-07-09 | Disposition: A | Discharge status: Home / Self Care | Payer: Medicare Other | Attending: Emergency Medicine | Admitting: Emergency Medicine

## 2018-07-09 ENCOUNTER — Ambulatory Visit: Payer: Medicare Other | Admitting: Gastroenterology

## 2018-07-09 DIAGNOSIS — B9789 Other viral agents as the cause of diseases classified elsewhere: Secondary | ICD-10-CM

## 2018-07-09 DIAGNOSIS — J069 Acute upper respiratory infection, unspecified: Secondary | ICD-10-CM

## 2018-07-09 DIAGNOSIS — R05 Cough: Secondary | ICD-10-CM | POA: Diagnosis not present

## 2018-07-09 DIAGNOSIS — J40 Bronchitis, not specified as acute or chronic: Secondary | ICD-10-CM

## 2018-07-09 MED ORDER — ALBUTEROL SULFATE HFA 108 (90 BASE) MCG/ACT IN AERS: INHALATION_SPRAY | RESPIRATORY_TRACT | 1 refills | Status: DC

## 2018-07-09 MED ORDER — PREDNISONE 20 MG PO TABS: 60.0000 mg | ORAL_TABLET | Freq: Every day | ORAL | 0 refills | Status: DC

## 2018-07-09 NOTE — Discharge Instructions (Signed)

## 2018-07-09 NOTE — ED Provider Notes (Signed)
[facility] Emergency Department Provider Note  ____________________________________________   First MD Initiated Contact with Patient 07/09/18 0117     (approximate)  I have reviewed the triage vital signs and the nursing notes.   HISTORY  Chief Complaint Cough    HPI Raymond [name] is a 53 y.o. male with medical history as listed below which notably includes COPD and ongoing smoking.  He presents for about 3 days of gradually worsening nasal congestion, runny nose, cough, and difficulty breathing particularly with exertion.  He describes the symptoms as moderate to severe.  He had similar symptoms last year and had influenza.  Nothing particular makes the symptoms better.  He denies chest pain except for when he coughs.  He denies any measured fever but sometimes feels like he has some chills.  He denies nausea, vomiting, abdominal pain, and dysuria.  He has not traveled recently and has not had exposure to anyone who is traveled outside of the country nor who has been diagnosed with COVID-19.  He is not on any immunosuppression.         Past Medical History:  Diagnosis Date  . Anxiety   . Depression   . Diabetes mellitus without complication ([place])   . 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 GERD (gastroesophageal reflux disease)   . Headache   . Hyperlipidemia   . Hypertension   . Irregular heart beat unk  . Myocardial infarction ([place])   . PTSD (post-traumatic stress disorder)   . Tachycardia     Patient Active Problem List   Diagnosis Date Noted  . Diastasis recti 06/19/2018  . Generalized anxiety disorder 04/01/2018  . Clotted blood in stool 12/31/2017  . Diarrhea 12/31/2017  . Encounter for general adult medical examination with abnormal findings 10/27/2017  . Uncontrolled type 2 diabetes mellitus with hyperglycemia ([place]) 10/27/2017  . Onychomycosis with ingrown toenail 10/27/2017  . Dysuria 10/27/2017  . Gastroesophageal  reflux disease with esophagitis 09/12/2017  . Uncontrolled type 2 diabetes mellitus with hypoglycemia without coma ([place]) 07/08/2017  . Shortness of breath 07/08/2017  . Cigarette smoker 07/08/2017  . Influenza A 05/30/2017  . Obstructive sleep apnea syndrome 08/12/2015  . Cervical spondylosis 06/14/2015  . Essential hypertension 04/08/2015  . Depression 04/08/2015  . Gastroesophageal reflux disease without esophagitis 01/05/2015  . 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  ingrown toenail removal    . CLAVICLE SURGERY Right   . COLONOSCOPY  2017  . SHOULDER SURGERY Right   . UPPER GI ENDOSCOPY  2017    Prior to Admission medications   Medication Sig Start Date End Date Taking? Authorizing Provider  ACCU-CHEK FASTCLIX LANCETS MISC yse as directed twice a day 05/07/17   Ronnell Freshwater, NP  albuterol (PROVENTIL HFA;VENTOLIN HFA) 108 (90 Base) MCG/ACT inhaler Inhale 2-4 puffs by mouth every 4 hours as needed for wheezing, cough, and/or shortness of breath 07/09/18   Hinda Kehr, MD  aspirin EC 81 MG tablet Take 81 mg by mouth. 01/12/16   [provider]  famotidine (PEPCID) 20 MG tablet Take 1 tablet (20 mg total) by mouth 2 (two) times daily. 06/26/18   Ronnell Freshwater, NP  fluticasone (FLONASE) 50 MCG/ACT nasal spray 2 sprays by Each Nare route daily. 05/16/16   [provider]  glucose blood (ACCU-CHEK AVIVA PLUS) test strip Blood sugar testing TID and as needed. E11.65 10/05/17  Ronnell Freshwater, NP  hydrochlorothiazide (HYDRODIURIL) 25 MG tablet Take 25 mg by mouth daily.     [provider]  hydrOXYzine (VISTARIL) 50 MG capsule Take 50 mg by mouth 2 (two) times daily as needed.    [provider]  insulin aspart (NOVOLOG FLEXPEN) 100 UNIT/ML FlexPen Use as directed per sliding scale. Instructions provided. 10/05/17   Ronnell Freshwater, NP  Insulin Glargine, 2 Unit Dial,  (TOUJEO MAX SOLOSTAR) 300 UNIT/ML SOPN Inject 60 Units into the skin daily. 06/28/18   Ronnell Freshwater, NP  metoprolol tartrate (LOPRESSOR) 100 MG tablet Take 1 tablet (100 mg total) by mouth 2 (two) times daily. 12/14/17   Ronnell Freshwater, NP  mirtazapine (REMERON) 45 MG tablet Take 1 tablet (45 mg total) by mouth at bedtime. 05/23/18   Ursula Alert, MD  nitroGLYCERIN (NITROSTAT) 0.4 MG SL tablet Place 0.4 mg under the tongue every 5 (five) minutes as needed for chest pain.    [provider]  predniSONE (DELTASONE) 20 MG tablet Take 3 tablets (60 mg total) by mouth daily. 07/09/18   Hinda Kehr, MD  QUEtiapine (SEROQUEL) 50 MG tablet Take 1 tablet (50 mg total) by mouth 2 (two) times daily as needed. 05/23/18   Ursula Alert, MD  simvastatin (ZOCOR) 40 MG tablet Take 40 mg by mouth at bedtime.    [provider]  sitaGLIPtin-metformin (JANUMET) 50-500 MG tablet Take 1 tablet by mouth 2 (two) times daily. 04/01/18   Ronnell Freshwater, NP  Suvorexant (BELSOMRA) 15 MG TABS Take 15 mg by mouth at bedtime. 06/21/18   Ursula Alert, MD    Allergies Onion; Hydrocodone; Norco [hydrocodone-acetaminophen]; and Hydrocodone-acetaminophen  Family History  Problem Relation Age of Onset  . Hypertension Mother   . Diabetes type II Mother   . Diabetes Mother   . COPD Father   . Cancer Father   . Heart disease Father   . Diabetes Sister   . Anxiety disorder Sister   . Depression Sister   . Diabetes Brother   . Anxiety disorder Brother   . Depression Brother   . Diabetes Maternal Aunt   . Diabetes Sister   . Anxiety disorder Sister   . Depression Sister   . Diabetes Sister   . Anxiety disorder Sister   . Depression Sister   . Diabetes Sister   . Anxiety disorder Sister   . Depression Sister     Social History Social History   Tobacco Use  . Smoking status: Current Every Day Smoker    Packs/day: 2.00    Years: 40.00    Pack years: 80.00    Types: Cigarettes  .  Smokeless tobacco: Former Systems developer    Types: Chew  . Tobacco comment: Reports has tried multiple things to help quit and cut back.   Substance Use Topics  . Alcohol use: No    Alcohol/week: 0.0 standard drinks    Frequency: Never  . Drug use: No    Review of Systems Constitutional: No fever, occasional chills Eyes: No visual changes. ENT: Nasal congestion and runny nose.  No sore throat. Cardiovascular: Denies chest pain. Respiratory: Cough and shortness of breath particular with exertion Gastrointestinal: No abdominal pain.  No nausea, no vomiting.  No diarrhea.  No constipation. Genitourinary: Negative for dysuria. Musculoskeletal: Negative for neck pain.  Negative for back pain. Integumentary: Negative for rash. Neurological: Negative for headaches, focal weakness or numbness.   ____________________________________________   PHYSICAL EXAM:  VITAL SIGNS:  ED Triage Vitals  Enc Vitals Group     BP 07/08/18 2152 (!) 162/97     Pulse Rate 07/08/18 2152 (!) 113     Resp 07/08/18 2152 20     Temp 07/08/18 2152 98.5 F (36.9 C)     Temp Source 07/08/18 2152 Oral     SpO2 07/08/18 2152 95 %     Weight 07/08/18 2153 115.2 kg (254 lb)     Height 07/08/18 2153 1.753 m (5\' 9" )     Head Circumference --      Peak Flow --      Pain Score 07/08/18 2152 0     Pain Loc --      Pain Edu? --      Excl. in [place]? --     Constitutional: Alert and oriented.  Generally well appearing and in no acute distress. Eyes: Conjunctivae are normal.  Head: Atraumatic. Nose: No congestion/rhinnorhea. Mouth/Throat: Mucous membranes are moist. Neck: No stridor.  No meningeal signs.   Cardiovascular: Normal rate, regular rhythm. Good peripheral circulation. Grossly normal heart sounds. Respiratory: Frequent strong cough productive of clear sputum.  Lung sounds are relatively clear except for some mild expiratory wheezing.  No coarse sounds, no rales or rhonchi. Gastrointestinal: Soft and nontender. No  distention.  Musculoskeletal: No lower extremity tenderness nor edema. No gross deformities of extremities. Neurologic:  Normal speech and language. No gross focal neurologic deficits are appreciated.  Skin:  Skin is warm, dry and intact. No rash noted.   ____________________________________________   LABS (all labs ordered are listed, but only abnormal results are displayed)  Labs Reviewed  INFLUENZA PANEL BY PCR (TYPE A & B)   ____________________________________________  EKG  No indication for EKG ____________________________________________  RADIOLOGY I, Hinda Kehr, personally viewed and evaluated these images (plain radiographs) as part of my medical decision making, as well as reviewing the written report by the radiologist.  ED MD interpretation: No evidence of acute pneumonia  Official radiology report(s): Dg Chest 2 View  Result Date: 07/09/2018 CLINICAL DATA:  Cough, fever EXAM: CHEST - 2 VIEW COMPARISON:  03/01/2018 FINDINGS: Heart is borderline in size. No confluent airspace opacities, effusions or edema. No acute bony abnormality. IMPRESSION: Borderline heart size.  No active disease. Electronically Signed   By: Rolm Baptise M.D.   On: 07/09/2018 02:19    ____________________________________________   PROCEDURES   Procedure(s) performed (including Critical Care):  Procedures   ____________________________________________   INITIAL IMPRESSION / MDM / [place] / ED COURSE  As part of my medical decision making, I reviewed the following data within the [facility] notes reviewed and incorporated, Labs reviewed , Old chart reviewed, Radiograph reviewed  and Notes from prior ED visits         Differential diagnosis includes, but is not limited to, viral illness including influenza, pneumonia, nonspecific bronchitis, COPD exacerbation.  The patient is generally well-appearing except for his chronic lung disease.  His  lung sounds are relatively clear except for very mild end expiratory wheezing and the patient has an inhaler at home.  He declined a DuoNeb treatment here in the emergency department.  Chest x-ray is clear and influenza is negative.  I believe he has acute bronchitis in the setting of chronic lung disease and a viral respiratory infection.  No concerns for COVID-19 based on no exposure history.  I will give him a course of steroids and another prescription for albuterol and he will  follow-up as an outpatient.  I gave my usual customary return precautions.     ____________________________________________  FINAL CLINICAL IMPRESSION(S) / ED DIAGNOSES  Final diagnoses:  Viral URI with cough  Bronchitis     MEDICATIONS GIVEN DURING THIS VISIT:  Medications - No data to display   ED Discharge Orders         Ordered    predniSONE (DELTASONE) 20 MG tablet  Daily     07/09/18 0349    albuterol (PROVENTIL HFA;VENTOLIN HFA) 108 (90 Base) MCG/ACT inhaler     07/09/18 0349           Note:  This document was prepared using Dragon voice recognition software and may include unintentional dictation errors.   Hinda Kehr, MD 07/09/18 [phone]

## 2018-07-10 ENCOUNTER — Inpatient Hospital Stay: Payer: Medicare Other | Admitting: Oncology

## 2018-07-11 ENCOUNTER — Telehealth: Payer: Self-pay | Admitting: Gastroenterology

## 2018-07-11 ENCOUNTER — Ambulatory Visit: Payer: Medicare Other | Admitting: Gastroenterology

## 2018-07-11 NOTE — Telephone Encounter (Signed)
I SPOKE WITH PATIENT & R/S PATIENT FROM 07-11-2018 TO 09-06-2018 DUE TO THE CORONAVIRUS.

## 2018-07-18 ENCOUNTER — Telehealth: Payer: Self-pay

## 2018-07-18 ENCOUNTER — Ambulatory Visit: Payer: Medicare Other | Admitting: Psychiatry

## 2018-07-18 NOTE — Telephone Encounter (Signed)
I do not have him on my schedule , not sure when his appointment is. Please check with lea.

## 2018-07-18 NOTE — Telephone Encounter (Signed)
pt appt 10: 30 states that he never got a call for appt.

## 2018-07-23 ENCOUNTER — Other Ambulatory Visit: Payer: Self-pay

## 2018-07-23 ENCOUNTER — Other Ambulatory Visit: Payer: Self-pay | Admitting: Psychiatry

## 2018-07-23 DIAGNOSIS — I10 Essential (primary) hypertension: Secondary | ICD-10-CM

## 2018-07-23 DIAGNOSIS — F5105 Insomnia due to other mental disorder: Secondary | ICD-10-CM

## 2018-07-23 MED ORDER — METOPROLOL TARTRATE 100 MG PO TABS: 100.0000 mg | ORAL_TABLET | Freq: Two times a day (BID) | ORAL | 1 refills | Status: DC

## 2018-07-24 ENCOUNTER — Other Ambulatory Visit: Payer: Self-pay

## 2018-07-24 DIAGNOSIS — I10 Essential (primary) hypertension: Secondary | ICD-10-CM

## 2018-07-24 MED ORDER — ALBUTEROL SULFATE HFA 108 (90 BASE) MCG/ACT IN AERS: INHALATION_SPRAY | RESPIRATORY_TRACT | 1 refills | Status: DC

## 2018-07-24 MED ORDER — METOPROLOL TARTRATE 100 MG PO TABS: 100.0000 mg | ORAL_TABLET | Freq: Two times a day (BID) | ORAL | 0 refills | Status: DC

## 2018-07-24 NOTE — Telephone Encounter (Signed)
Pt canceled his appt

## 2018-07-31 ENCOUNTER — Other Ambulatory Visit: Payer: Self-pay

## 2018-07-31 ENCOUNTER — Telehealth: Payer: Self-pay

## 2018-07-31 DIAGNOSIS — F5105 Insomnia due to other mental disorder: Secondary | ICD-10-CM

## 2018-07-31 MED ORDER — HYDROXYZINE PAMOATE 50 MG PO CAPS: 50.0000 mg | ORAL_CAPSULE | Freq: Every evening | ORAL | 1 refills | Status: DC | PRN

## 2018-07-31 MED ORDER — TRAZODONE HCL 100 MG PO TABS: 200.0000 mg | ORAL_TABLET | Freq: Every day | ORAL | 1 refills | Status: DC

## 2018-07-31 MED ORDER — ALBUTEROL SULFATE HFA 108 (90 BASE) MCG/ACT IN AERS: INHALATION_SPRAY | RESPIRATORY_TRACT | 1 refills | Status: DC

## 2018-07-31 NOTE — Telephone Encounter (Signed)
Pt called having issues with getting inhaler, contacted the pharmacy and they said that his insurance covers proair, sent in new rx.

## 2018-07-31 NOTE — Telephone Encounter (Signed)
pt states he can not take the belsomra it make him hard to breath.

## 2018-07-31 NOTE — Progress Notes (Signed)
[facility] [place], [place] [address]  Internal MEDICINE  Office Visit Note  Patient Name: Raymond [name]  [id number]  [id number]  Date of Service: 07/31/2018  Chief Complaint  Patient presents with  . Medical Management of Chronic Issues    2 month follow up  . Diabetes    A1C    The patient is here for routine follow up visit. Blood sugars are still running moderately elevated, however, they are improved. His HgbA1c has dropped to 10.5 which was down from 14.2 at the last check. He has seen mental health provider. They have stopped his seroquel due to the elevation in his blood usgars. He will have follow up with them 3/26 for further evaluation.  He has also seen gi provider. Currently, he is scheduled to have banding of internal hemorrhoids.3/19, which were found and causing chronic bleeding. 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 He will be seeing hematology and will likely have phlebotomy treatments to combat this issue.       Current Medication: Outpatient Encounter Medications as of 07/04/2018  Medication Sig Note  . ACCU-CHEK FASTCLIX LANCETS MISC yse as directed twice a day   . aspirin EC 81 MG tablet Take 81 mg by mouth.   . famotidine (PEPCID) 20 MG tablet Take 1 tablet (20 mg total) by mouth 2 (two) times daily.   . fluticasone (FLONASE) 50 MCG/ACT nasal spray 2 sprays by Each Nare route daily. 07/06/2017: Ran out  and needs new prescription  . glucose blood (ACCU-CHEK AVIVA PLUS) test strip Blood sugar testing TID and as needed. E11.65   . hydrochlorothiazide (HYDRODIURIL) 25 MG tablet Take 25 mg by mouth daily.  05/30/2017: Patient reports taking but no recent fills per pharmacy records   . insulin aspart (NOVOLOG FLEXPEN) 100 UNIT/ML FlexPen Use as directed per sliding scale. Instructions provided.   . Insulin Glargine, 2 Unit  Dial, (TOUJEO MAX SOLOSTAR) 300 UNIT/ML SOPN Inject 60 Units into the skin daily.   . nitroGLYCERIN (NITROSTAT) 0.4 MG SL tablet Place 0.4 mg under the tongue every 5 (five) minutes as needed for chest pain.   Marland Kitchen QUEtiapine (SEROQUEL) 50 MG tablet Take 1 tablet (50 mg total) by mouth 2 (two) times daily as needed.   . simvastatin (ZOCOR) 40 MG tablet Take 40 mg by mouth at bedtime.   . sitaGLIPtin-metformin (JANUMET) 50-500 MG tablet Take 1 tablet by mouth 2 (two) times daily.   . Suvorexant (BELSOMRA) 15 MG TABS Take 15 mg by mouth at bedtime.   . [DISCONTINUED] albuterol (PROVENTIL HFA;VENTOLIN HFA) 108 (90 Base) MCG/ACT inhaler Inhale 2 puffs into the lungs every 4 (four) hours as needed for wheezing or shortness of breath.   . [DISCONTINUED] hydrOXYzine (VISTARIL) 50 MG capsule Take 50 mg by mouth 2 (two) times daily as needed.   . [DISCONTINUED] metoprolol tartrate (LOPRESSOR) 100 MG tablet Take 1 tablet (100 mg total) by mouth 2 (two) times daily.   . [DISCONTINUED] mirtazapine (REMERON) 45 MG tablet Take 1 tablet (45 mg total) by mouth at bedtime.    No facility-administered encounter medications on file as of 07/04/2018.     Surgical History: Past Surgical History:  Procedure Laterality Date  .  ingrown toenail removal    . CLAVICLE SURGERY Right   . COLONOSCOPY  2017  . SHOULDER SURGERY Right   . UPPER  GI ENDOSCOPY  2017    Medical History: Past Medical History:  Diagnosis Date  . Anxiety   . Depression   . Diabetes mellitus without complication ([place])   . 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 GERD (gastroesophageal reflux disease)   . Headache   . Hyperlipidemia   . Hypertension   . Irregular heart beat unk  . Myocardial infarction ([place])   . PTSD (post-traumatic stress disorder)   . Tachycardia     Family History: Family History  Problem Relation Age of Onset  . Hypertension Mother   . Diabetes type II Mother   . Diabetes Mother   . COPD Father   . Cancer  Father   . Heart disease Father   . Diabetes Sister   . Anxiety disorder Sister   . Depression Sister   . Diabetes Brother   . Anxiety disorder Brother   . Depression Brother   . Diabetes Maternal Aunt   . Diabetes Sister   . Anxiety disorder Sister   . Depression Sister   . Diabetes Sister   . Anxiety disorder Sister   . Depression Sister   . Diabetes Sister   . Anxiety disorder Sister   . Depression Sister     Social History   Socioeconomic History  . Marital status: Divorced    Spouse name: Not on file  . Number of children: 4  . Years of education: Not on file  . Highest education level: 8th grade  Occupational History  . Not on file  Social Needs  . Financial resource strain: Very hard  . Food insecurity:    Worry: Often true    Inability: Often true  . Transportation needs:    Medical: No    Non-medical: No  Tobacco Use  . Smoking status: Current Every Day Smoker    Packs/day: 2.00    Years: 40.00    Pack years: 80.00    Types: Cigarettes  . Smokeless tobacco: Former Systems developer    Types: Chew  . Tobacco comment: Reports has tried multiple things to help quit and cut back.   Substance and Sexual Activity  . Alcohol use: No    Alcohol/week: 0.0 standard drinks    Frequency: Never  . Drug use: No  . Sexual activity: Not Currently  Lifestyle  . Physical activity:    Days per week: 0 days    Minutes per session: 0 min  . Stress: Very much  Relationships  . Social connections:    Talks on phone: Not on file    Gets together: Not on file    Attends religious service: Never    Active member of club or organization: No    Attends meetings of clubs or organizations: Never    Relationship status: Divorced  . Intimate partner violence:    Fear of current or ex partner: No    Emotionally abused: No    Physically abused: No    Forced sexual activity: No  Other Topics Concern  . Not on file  Social History Narrative  . Not on file      Review of Systems   Constitutional: Positive for fatigue. Negative for activity change, appetite change and unexpected weight change.  HENT: Negative for congestion, postnasal drip, sore throat and voice change.   Respiratory: Negative for cough, chest tightness, shortness of breath and wheezing.   Cardiovascular: Negative for chest pain, palpitations and leg swelling.       Blood pressure improved  today.  Gastrointestinal: Positive for abdominal pain, blood in stool and diarrhea. Negative for abdominal distention, nausea and vomiting.       Patient now seeing GI specialist and is scheduled to have banding on internal hemorrhoids. Is also being watched with issues with gallbladder.   Endocrine: Positive for polydipsia and polyuria.       Blood sugars are still very elevated, however, they have improved since last several visit.s   Genitourinary: Negative for dysuria and flank pain.  Musculoskeletal: Negative for arthralgias, back pain and myalgias.  Skin: Negative for color change, pallor, rash and wound.  Allergic/Immunologic: Positive for environmental allergies.  Neurological: Negative for dizziness, weakness and headaches.  Hematological: Negative for adenopathy.  Psychiatric/Behavioral: Positive for sleep disturbance. Negative for behavioral problems, dysphoric mood and suicidal ideas. The patient is nervous/anxious.        Being followed per psychiatry.     Today's Vitals   07/04/18 1138  BP: (!) 137/91  Pulse: 92  Resp: 16  SpO2: 96%  Weight: 259 lb 12.8 oz (117.8 kg)  Height: 5\' 9"  (1.753 m)   Body mass index is 38.37 kg/m.  Physical Exam Vitals signs and nursing note reviewed.  Constitutional:      General: He is not in acute distress.    Appearance: He is well-developed. He is not diaphoretic.  HENT:     Head: Normocephalic and atraumatic.     Nose: Nose normal.     Mouth/Throat:     Pharynx: No oropharyngeal exudate.  Eyes:     Conjunctiva/sclera: Conjunctivae normal.     Pupils:  Pupils are equal, round, and reactive to light.  Neck:     Musculoskeletal: Normal range of motion and neck supple.     Thyroid: No thyromegaly.     Vascular: No carotid bruit or JVD.     Trachea: No tracheal deviation.  Cardiovascular:     Rate and Rhythm: Normal rate and regular rhythm.     Heart sounds: Normal heart sounds. No murmur. No friction rub. No gallop.   Pulmonary:     Effort: Pulmonary effort is normal. No respiratory distress.     Breath sounds: Normal breath sounds. No wheezing or rales.  Chest:     Chest wall: No tenderness.  Abdominal:     General: Bowel sounds are normal.     Palpations: Abdomen is soft.     Tenderness: There is no abdominal tenderness.     Comments: Mild, generalized abdominal tenderness.  Musculoskeletal: Normal range of motion.  Lymphadenopathy:     Cervical: No cervical adenopathy.  Skin:    General: Skin is warm and dry.     Capillary Refill: Capillary refill takes 2 to 3 seconds.  Neurological:     Mental Status: He is alert and oriented to person, place, and time.     Cranial Nerves: No cranial nerve deficit.  Psychiatric:        Mood and Affect: Mood is anxious and depressed.        Speech: Speech normal.        Behavior: Behavior normal.        Thought Content: Thought content normal.        Judgment: Judgment normal.   Assessment/Plan: 1. Uncontrolled type 2 diabetes mellitus with hyperglycemia (HCC) - POCT HgB A1C 10.5 today, down from 14.2 at his last visit. Increased basal insulin to 60units everyday. Continue all other diabetic medication as prescribed. Continue with increased exercise and monitor  diet closely.   2. Essential hypertension Improved and stable. Continue BP medication as prescribed   3. Gastroesophageal reflux disease without esophagitis Continue to see GI specialist as scheduled.   General Counseling: Kadin verbalizes understanding of the findings of todays visit and agrees with plan of treatment. I have  discussed any further diagnostic evaluation that may be needed or ordered today. We also reviewed his medications today. he has been encouraged to call the office with any questions or concerns that should arise related to todays visit.  Diabetes Counseling:  1. Addition of ACE inh/ ARB'S for nephroprotection. Microalbumin is updated  2. Diabetic foot care, prevention of complications. Podiatry consult 3. Exercise and lose weight.  4. Diabetic eye examination, Diabetic eye exam is updated  5. Monitor blood sugar closlely. nutrition counseling.  6. Sign and symptoms of hypoglycemia including shaking sweating,confusion and headaches.   This patient was seen by Leretha Pol FNP Collaboration with Dr Lavera Guise as a part of collaborative care agreement  Orders Placed This Encounter  Procedures  . POCT HgB A1C     Time spent: 25 Minutes      Dr Lavera Guise Internal medicine

## 2018-07-31 NOTE — Telephone Encounter (Signed)
SPOKE TO PATIENT. Patient having side effects to Belsomra. Discussed stopping. Restart Trazodone at 200 mg. Vistaril 50 mg as needed for sleep.

## 2018-08-01 ENCOUNTER — Other Ambulatory Visit: Payer: Self-pay

## 2018-08-01 ENCOUNTER — Ambulatory Visit (INDEPENDENT_AMBULATORY_CARE_PROVIDER_SITE_OTHER): Payer: Medicare Other | Admitting: Psychiatry

## 2018-08-01 ENCOUNTER — Encounter: Payer: Self-pay | Admitting: Psychiatry

## 2018-08-01 DIAGNOSIS — F5105 Insomnia due to other mental disorder: Secondary | ICD-10-CM | POA: Diagnosis not present

## 2018-08-01 DIAGNOSIS — F331 Major depressive disorder, recurrent, moderate: Secondary | ICD-10-CM

## 2018-08-01 DIAGNOSIS — F4310 Post-traumatic stress disorder, unspecified: Secondary | ICD-10-CM | POA: Diagnosis not present

## 2018-08-01 NOTE — Progress Notes (Signed)
Tc on  07-31-08 @ 2:27 pt medications and pharmacy was reviewed and updated. Pt allergies was reviewed no changes. Pt medical and surgical hx was reviewed and no changes. Pt vitals were not taken because this is a phone visit.

## 2018-08-01 NOTE — Progress Notes (Signed)
Virtual Visit via Telephone Note  I connected with Raymond [name] on 08/01/18 at  3:15 PM EDT by telephone and verified that I am speaking with the correct person using two identifiers.   I discussed the limitations, risks, security and privacy concerns of performing an evaluation and management service by telephone and the availability of in person appointments. I also discussed with the patient that there may be a patient responsible charge related to this service. The patient expressed understanding and agreed to proceed.   I discussed the assessment and treatment plan with the patient. The patient was provided an opportunity to ask questions and all were answered. The patient agreed with the plan and demonstrated an understanding of the instructions.   The patient was advised to call back or seek an in-person evaluation if the symptoms worsen or if the condition fails to improve as anticipated.  I provided 15 minutes of non-face-to-face time during this encounter.   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  Patient today reports he continues to struggle with sleep.  He tried the Belsomra which is currently keeping him up at night.  Patient hence is interested in medication changes.  Discussed readjusting his medications, adding trazodone back and adding a small dose of hydroxyzine.  Patient agrees with plan.  Patient is compliant on his mirtazapine which helps with his mood symptoms.  He however continues to struggle with a lot of anxiety.  He reports he is anxious about the COVID-19  outbreak.  His daughter is a [place] and his wife also works in [employer].  He hence is very worried about them.  Discussed with patient to start psychotherapy sessions with Ms. Alden Hipp his therapist.  Patient reports he has been unable to get an appointment since he canceled his last one.  Pt continues to take Seroquel as needed when he has a lot of anxiety symptoms.  However he does not want to take it a lot due to its effect on his blood sugar level.  Discussed adding BuSpar, however he reports he will reach out to writer if his anxiety symptoms worsens.  Patient denies any suicidality, homicidality or perceptual disturbances.   Visit Diagnosis:    ICD-10-CM   1. PTSD (post-traumatic stress disorder) F43.10   2. MDD (major depressive disorder), recurrent episode, moderate (HCC) F33.1   3. Insomnia due to mental disorder F51.05     Past Psychiatric History: I have reviewed past psychiatric history from my progress note on 05/23/2018.  Past trials of Prozac, Zoloft, Celexa, Tegretol, doxepin, Klonopin, Seroquel, mirtazapine, Lunesta, Ambien  Past Medical History:  Past Medical History:  Diagnosis Date  . Anxiety   . Depression   . Diabetes mellitus without complication ([place])   . 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 GERD (gastroesophageal reflux disease)   . Headache   . Hyperlipidemia   . Hypertension   . Irregular heart beat unk  . Myocardial infarction ([place])   . PTSD (post-traumatic stress disorder)   . Tachycardia     Past Surgical History:  Procedure Laterality Date  .  ingrown  toenail removal    . CLAVICLE SURGERY Right   . COLONOSCOPY  2017  . SHOULDER SURGERY Right   . UPPER GI ENDOSCOPY  2017    Family Psychiatric History: Reviewed family psychiatric history from my progress note on 05/23/2018.  Family History:  Family History  Problem Relation Age of Onset  . Hypertension Mother   . Diabetes type II Mother   . Diabetes Mother   . COPD Father    . Cancer Father   . Heart disease Father   . Diabetes Sister   . Anxiety disorder Sister   . Depression Sister   . Diabetes Brother   . Anxiety disorder Brother   . Depression Brother   . Diabetes Maternal Aunt   . Diabetes Sister   . Anxiety disorder Sister   . Depression Sister   . Diabetes Sister   . Anxiety disorder Sister   . Depression Sister   . Diabetes Sister   . Anxiety disorder Sister   . Depression Sister     Social History: Have reviewed social history from my progress note on 05/23/2018. Social History   Socioeconomic History  . Marital status: Divorced    Spouse name: Not on file  . Number of children: 4  . Years of education: Not on file  . Highest education level: 8th grade  Occupational History  . Not on file  Social Needs  . Financial resource strain: Very hard  . Food insecurity:    Worry: Often true    Inability: Often true  . Transportation needs:    Medical: No    Non-medical: No  Tobacco Use  . Smoking status: Current Every Day Smoker    Packs/day: 2.00    Years: 40.00    Pack years: 80.00    Types: Cigarettes  . Smokeless tobacco: Former Systems developer    Types: Chew  . Tobacco comment: Reports has tried multiple things to help quit and cut back.   Substance and Sexual Activity  . Alcohol use: No    Alcohol/week: 0.0 standard drinks    Frequency: Never  . Drug use: No  . Sexual activity: Not Currently  Lifestyle  . Physical activity:    Days per week: 0 days    Minutes per session: 0 min  . Stress: Very much  Relationships  . Social connections:    Talks on phone: Not on file    Gets together: Not on file    Attends religious service: Never    Active member of club or organization: No    Attends meetings of clubs or organizations: Never    Relationship status: Divorced  Other Topics Concern  . Not on file  Social History Narrative  . Not on file    Allergies:  Allergies  Allergen Reactions  . Onion Anaphylaxis  . Hydrocodone    . Norco [Hydrocodone-Acetaminophen] Itching  . Hydrocodone-Acetaminophen Itching    Metabolic Disorder Labs: Lab Results  Component Value Date   HGBA1C 10.5 (A) 07/04/2018   MPG 294.83 05/31/2017   No results found for: PROLACTIN Lab Results  Component Value Date   CHOL 134 07/15/2015   TRIG 227 (H) 07/15/2015   HDL 28 (L) 07/15/2015   CHOLHDL 4.8 07/15/2015   VLDL 26 12/09/2012   LDLCALC 61 07/15/2015   LDLCALC 96 12/09/2012   Lab Results  Component Value Date   TSH 4.120 06/10/2018   TSH 2.000 07/15/2015    Therapeutic Level Labs: No results found for:  LITHIUM No results found for: VALPROATE No components found for:  CBMZ  Current Medications: Current Outpatient Medications  Medication Sig Dispense Refill  . ACCU-CHEK FASTCLIX LANCETS MISC yse as directed twice a day 100 each 1  . albuterol (PROAIR HFA) 108 (90 Base) MCG/ACT inhaler Inhale 2-4 puffs by mouth Every 4-6 hours as needed for wheezing, cough, and/or shortness of breath 3 Inhaler 1  . albuterol (PROVENTIL HFA;VENTOLIN HFA) 108 (90 Base) MCG/ACT inhaler Inhale 2-4 puffs by mouth every 4 hours as needed for wheezing, cough, and/or shortness of breath 1 Inhaler 1  . aspirin EC 81 MG tablet Take 81 mg by mouth.    . famotidine (PEPCID) 20 MG tablet Take 1 tablet (20 mg total) by mouth 2 (two) times daily. 60 tablet 3  . fluticasone (FLONASE) 50 MCG/ACT nasal spray 2 sprays by Each Nare route daily.    Marland Kitchen glucose blood (ACCU-CHEK AVIVA PLUS) test strip Blood sugar testing TID and as needed. E11.65 100 each 12  . hydrochlorothiazide (HYDRODIURIL) 25 MG tablet Take 25 mg by mouth daily.     . hydrOXYzine (VISTARIL) 50 MG capsule Take 1 capsule (50 mg total) by mouth at bedtime as needed. FOR SLEEP 30 capsule 1  . insulin aspart (NOVOLOG FLEXPEN) 100 UNIT/ML FlexPen Use as directed per sliding scale. Instructions provided. 15 mL 11  . Insulin Glargine, 2 Unit Dial, (TOUJEO MAX SOLOSTAR) 300 UNIT/ML SOPN Inject 60  Units into the skin daily. 3 pen 5  . metoprolol tartrate (LOPRESSOR) 100 MG tablet Take 1 tablet (100 mg total) by mouth 2 (two) times daily. 180 tablet 0  . mirtazapine (REMERON) 45 MG tablet TAKE 1 TABLET BY MOUTH AT BEDTIME 30 tablet 0  . nitroGLYCERIN (NITROSTAT) 0.4 MG SL tablet Place 0.4 mg under the tongue every 5 (five) minutes as needed for chest pain.    . predniSONE (DELTASONE) 20 MG tablet Take 3 tablets (60 mg total) by mouth daily. 15 tablet 0  . QUEtiapine (SEROQUEL) 50 MG tablet Take 1 tablet (50 mg total) by mouth 2 (two) times daily as needed. 60 tablet 1  . simvastatin (ZOCOR) 40 MG tablet Take 40 mg by mouth at bedtime.    . sitaGLIPtin-metformin (JANUMET) 50-500 MG tablet Take 1 tablet by mouth 2 (two) times daily. 180 tablet 1  . traZODone (DESYREL) 100 MG tablet Take 2 tablets (200 mg total) by mouth at bedtime. 60 tablet 1   No current facility-administered medications for this visit.      Musculoskeletal: Strength & Muscle Tone: UTA Gait & Station: UTA Patient leans: N/A  Psychiatric Specialty Exam: Review of Systems  Psychiatric/Behavioral: The patient is nervous/anxious and has insomnia.   All other systems reviewed and are negative.   There were no vitals taken for this visit.There is no height or weight on file to calculate BMI.  General Appearance: UTA  Eye Contact:  UTA  Speech:  Normal Rate  Volume:  Normal  Mood:  Anxious  Affect:  UTA  Thought Process:  Goal Directed and Descriptions of Associations: Intact  Orientation:  Full (Time, Place, and Person)  Thought Content: Logical   Suicidal Thoughts:  No  Homicidal Thoughts:  No  Memory:  Immediate;   Fair Recent;   Fair Remote;   Fair  Judgement:  Fair  Insight:  Fair  Psychomotor Activity:  UTA  Concentration:  Concentration: Fair and Attention Span: Fair  Recall:  [employer] of Knowledge: Fair  Language: Fair  Akathisia:  No  Handed:  Right  AIMS (if indicated): UTA  Assets:   Communication Skills Desire for Improvement Social Support  ADL's:  Intact  Cognition: WNL  Sleep:  Poor   Screenings: Mini-Mental     Clinical Support from 10/05/2017 in [facility], [facility]  Total Score (max 30 points )  30    PHQ2-9     Office Visit from 07/04/2018 in [facility], [facility] Office Visit from 04/01/2018 in [facility], [facility] Office Visit from 12/31/2017 in [facility], [facility] Office Visit from 10/23/2017 in [facility], [place] from 10/05/2017 in 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  Patient is biologically predisposed given his multiple medical problems, history of trauma.  Patient is currently having psychosocial stressors of his own health problems, current COVID-19 crisis.  Patient will benefit from more frequent psychotherapy sessions as well as continued medication changes since he also struggles with anxiety and sleep problems.  Plan as noted below.  Plan MDD-improving Mirtazapine 45 mg p.o. nightly Continue Seroquel 50 mg p.o. twice daily as needed for now. Discussed with patient to restart psychotherapy sessions on a more frequent basis.  PTSD-improving Continue CBT.  For insomnia-unstable Discontinue Belsomra. Start trazodone 200 mg p.o. nightly Add hydroxyzine 50 mg p.o. nightly as needed Discussed sleep hygiene techniques.  Follow-up in clinic in 4 weeks or sooner if needed.  I have spent atleast 15 minutes non face to face with patient today. More than 50 % of the time was spent for psychoeducation and supportive psychotherapy and care coordination.  This note was generated in  part or whole with voice recognition software. Voice recognition is usually quite accurate but there are transcription errors that can and very often do occur. I apologize for any typographical errors that were not detected and corrected.        Ursula Alert, MD 08/01/2018, 5:23 PM

## 2018-08-05 ENCOUNTER — Ambulatory Visit: Payer: Medicare Other | Admitting: Psychiatry

## 2018-08-13 ENCOUNTER — Other Ambulatory Visit: Payer: Self-pay

## 2018-08-13 ENCOUNTER — Encounter: Payer: Self-pay | Admitting: Licensed Clinical Social Worker

## 2018-08-13 ENCOUNTER — Ambulatory Visit (INDEPENDENT_AMBULATORY_CARE_PROVIDER_SITE_OTHER): Payer: Medicare Other | Admitting: Licensed Clinical Social Worker

## 2018-08-13 DIAGNOSIS — F4310 Post-traumatic stress disorder, unspecified: Secondary | ICD-10-CM
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
## 2018-08-15 ENCOUNTER — Encounter: Payer: Self-pay | Admitting: Nurse Practitioner

## 2018-08-15 ENCOUNTER — Ambulatory Visit (INDEPENDENT_AMBULATORY_CARE_PROVIDER_SITE_OTHER): Payer: Medicare Other | Admitting: Nurse Practitioner

## 2018-08-15 ENCOUNTER — Other Ambulatory Visit: Payer: Self-pay

## 2018-08-15 VITALS — BP 167/90 | HR 84 | Temp 98.0°F | Resp 16 | Ht 69.0 in | Wt 250.0 lb

## 2018-08-15 DIAGNOSIS — I10 Essential (primary) hypertension: Secondary | ICD-10-CM

## 2018-08-15 DIAGNOSIS — R1012 Left upper quadrant pain: Secondary | ICD-10-CM

## 2018-08-15 DIAGNOSIS — E11649 Type 2 diabetes mellitus with hypoglycemia without coma: Secondary | ICD-10-CM | POA: Diagnosis not present

## 2018-08-15 DIAGNOSIS — K529 Noninfective gastroenteritis and colitis, unspecified: Secondary | ICD-10-CM | POA: Diagnosis not present

## 2018-08-15 MED ORDER — CIPROFLOXACIN HCL 500 MG PO TABS: 500.0000 mg | ORAL_TABLET | Freq: Two times a day (BID) | ORAL | 0 refills | Status: DC

## 2018-08-15 MED ORDER — METRONIDAZOLE 500 MG PO TABS: 500.0000 mg | ORAL_TABLET | Freq: Three times a day (TID) | ORAL | 0 refills | Status: DC

## 2018-08-15 NOTE — Progress Notes (Signed)
[facility] [place], [place] [address]  Internal MEDICINE  Telephone Visit  Patient Name: Raymond [name]  [id number]  [id number]  Date of Service: 08/16/2018  I connected with the patient at 4:47pm by webcam and verified the patients identity using two identifiers.   I discussed the limitations, risks, security and privacy concerns of performing an evaluation and management service by webcam and the availability of in person appointments. I also discussed with the patient that there may be a patient responsible charge related to the service.  The patient expressed understanding and agrees to proceed.    Chief Complaint  Patient presents with  . Telephone Assessment  . Telephone Screen  . Diarrhea  . Pain    below you left rib ,no chest pain,no sob and no arm pain going on for 2 weeks     The patient has been contacted via webcam for follow up visit due to concerns for spread of novel coronavirus.       Current Medication: Outpatient Encounter Medications as of 08/15/2018  Medication Sig Note  . ACCU-CHEK FASTCLIX LANCETS MISC yse as directed twice a day   . albuterol (PROAIR HFA) 108 (90 Base) MCG/ACT inhaler Inhale 2-4 puffs by mouth Every 4-6 hours as needed for wheezing, cough, and/or shortness of breath   . albuterol (PROVENTIL HFA;VENTOLIN HFA) 108 (90 Base) MCG/ACT inhaler Inhale 2-4 puffs by mouth every 4 hours as needed for wheezing, cough, and/or shortness of breath   . aspirin EC 81 MG tablet Take 81 mg by mouth.   . ciprofloxacin (CIPRO) 500 MG tablet Take 1 tablet (500 mg total) by mouth 2 (two) times daily.   . famotidine (PEPCID) 20 MG tablet Take 1 tablet (20 mg total) by mouth 2 (two) times daily.   . fluticasone (FLONASE) 50 MCG/ACT nasal spray 2 sprays by Each Nare route daily. 07/06/2017: Ran out  and needs new prescription  . glucose blood (ACCU-CHEK AVIVA PLUS) test strip Blood sugar testing TID and as needed. E11.65   .  hydrochlorothiazide (HYDRODIURIL) 25 MG tablet Take 25 mg by mouth daily.  05/30/2017: Patient reports taking but no recent fills per pharmacy records   . hydrOXYzine (VISTARIL) 50 MG capsule Take 1 capsule (50 mg total) by mouth at bedtime as needed. FOR SLEEP   . insulin aspart (NOVOLOG FLEXPEN) 100 UNIT/ML FlexPen Use as directed per sliding scale. Instructions provided.   . Insulin Glargine, 2 Unit Dial, (TOUJEO MAX SOLOSTAR) 300 UNIT/ML SOPN Inject 60 Units into the skin daily.   . metoprolol tartrate (LOPRESSOR) 100 MG tablet Take 1 tablet (100 mg total) by mouth 2 (two) times daily.   . metroNIDAZOLE (FLAGYL) 500 MG tablet Take 1 tablet (500 mg total) by mouth 3 (three) times daily.   . mirtazapine (REMERON) 45 MG tablet TAKE 1 TABLET BY MOUTH AT BEDTIME   . nitroGLYCERIN (NITROSTAT) 0.4 MG SL tablet Place 0.4 mg under the tongue every 5 (five) minutes as needed for chest pain.   . predniSONE (DELTASONE) 20 MG tablet Take 3 tablets (60 mg total) by mouth daily.   . QUEtiapine (SEROQUEL) 50 MG tablet Take 1 tablet (50 mg total) by mouth 2 (two) times daily as needed.   . simvastatin (ZOCOR) 40 MG tablet Take 40 mg by mouth at bedtime.   . sitaGLIPtin-metformin (JANUMET) 50-500 MG tablet Take 1 tablet by mouth 2 (two) times daily.   . traZODone (DESYREL) 100 MG tablet Take 2 tablets (  200 mg total) by mouth at bedtime.    No facility-administered encounter medications on file as of 08/15/2018.     Surgical History: Past Surgical History:  Procedure Laterality Date  .  ingrown toenail removal    . CLAVICLE SURGERY Right   . COLONOSCOPY  2017  . SHOULDER SURGERY Right   . UPPER GI ENDOSCOPY  2017    Medical History: Past Medical History:  Diagnosis Date  . Anxiety   . Depression   . Diabetes mellitus without complication ([place])   . 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 GERD (gastroesophageal reflux disease)   . Headache   . Hyperlipidemia   . Hypertension   . Irregular heart  beat unk  . Myocardial infarction ([place])   . PTSD (post-traumatic stress disorder)   . Tachycardia     Family History: Family History  Problem Relation Age of Onset  . Hypertension Mother   . Diabetes type II Mother   . Diabetes Mother   . COPD Father   . Cancer Father   . Heart disease Father   . Diabetes Sister   . Anxiety disorder Sister   . Depression Sister   . Diabetes Brother   . Anxiety disorder Brother   . Depression Brother   . Diabetes Maternal Aunt   . Diabetes Sister   . Anxiety disorder Sister   . Depression Sister   . Diabetes Sister   . Anxiety disorder Sister   . Depression Sister   . Diabetes Sister   . Anxiety disorder Sister   . Depression Sister     Social History   Socioeconomic History  . Marital status: Divorced    Spouse name: Not on file  . Number of children: 4  . Years of education: Not on file  . Highest education level: 8th grade  Occupational History  . Not on file  Social Needs  . Financial resource strain: Very hard  . Food insecurity:    Worry: Often true    Inability: Often true  . Transportation needs:    Medical: No    Non-medical: No  Tobacco Use  . Smoking status: Current Every Day Smoker    Packs/day: 2.00    Years: 40.00    Pack years: 80.00    Types: Cigarettes  . Smokeless tobacco: Former Systems developer    Types: Chew  . Tobacco comment: Reports has tried multiple things to help quit and cut back.   Substance and Sexual Activity  . Alcohol use: No    Alcohol/week: 0.0 standard drinks    Frequency: Never  . Drug use: No  . Sexual activity: Not Currently  Lifestyle  . Physical activity:    Days per week: 0 days    Minutes per session: 0 min  . Stress: Very much  Relationships  . Social connections:    Talks on phone: Not on file    Gets together: Not on file    Attends religious service: Never    Active member of club or organization: No    Attends meetings of clubs or organizations: Never    Relationship  status: Divorced  . Intimate partner violence:    Fear of current or ex partner: No    Emotionally abused: No    Physically abused: No    Forced sexual activity: No  Other Topics Concern  . Not on file  Social History Narrative  . Not on file      Review of Systems  Constitutional: Negative for chills, fatigue and unexpected weight change.  HENT: Negative for congestion, postnasal drip, rhinorrhea, sneezing and sore throat.   Respiratory: Negative for cough, chest tightness and shortness of breath.   Cardiovascular: Negative for chest pain and palpitations.  Gastrointestinal: Positive for abdominal pain, diarrhea and nausea. Negative for constipation and vomiting.  Musculoskeletal: Negative for arthralgias, back pain, joint swelling and neck pain.  Skin: Negative for rash.  Neurological: Negative.  Negative for tremors and numbness.  Hematological: Negative for adenopathy. Does not bruise/bleed easily.  Psychiatric/Behavioral: Negative for behavioral problems (Depression), sleep disturbance and suicidal ideas. The patient is nervous/anxious.     Today's Vitals   08/15/18 1543  BP: (!) 167/90  Pulse: 84  Resp: 16  Temp: 98 F (36.7 C)  Weight: 250 lb (113.4 kg)  Height: 5\' 9"  (1.753 m)   Body mass index is 36.92 kg/m.  Observation/Objective:  The patient is alert and oriented. He appears to be in moderate pain, guarding the left upper quadrant of his abdomen when he moves.    Assessment/Plan:  1. Enterocolitis Suspect infectious entrocolitis or diverticulitis. Start cipro 500mg  bid for 10 days and flagyl 500mg  tid for 7 days. Rest and increase fluid intake. Recommend he take antibiotics with food and also add probiotic to keep up good bacteria throughout GI tract.  - ciprofloxacin (CIPRO) 500 MG tablet; Take 1 tablet (500 mg total) by mouth 2 (two) times daily.  Dispense: 20 tablet; Refill: 0 - metroNIDAZOLE (FLAGYL) 500 MG tablet; Take 1 tablet (500 mg total) by mouth  3 (three) times daily.  Dispense: 21 tablet; Refill: 0  2. Left upper quadrant abdominal pain of unknown etiology Likely due to entrocolitis. Severe with strain. Advised him this should improve after antibiotics.. however, if it does not, and pain gets worse and more persistent, he shoud seek care in ER immediately. He has voiced understanding and agreement with instructions.   3. Uncontrolled type 2 diabetes mellitus with hypoglycemia without coma ([place]) Continue all diabetic medication as precribed   4. Essential hypertension Elevation today, likely from pain. Recommend he continue bp medication as prescribed   General Counseling: Tali verbalizes understanding of the findings of today's phone visit and agrees with plan of treatment. I have discussed any further diagnostic evaluation that may be needed or ordered today. We also reviewed his medications today. he has been encouraged to call the office with any questions or concerns that should arise related to todays visit.   This patient was seen by [place] with Dr Lavera Guise as a part of collaborative care agreement  Meds ordered this encounter  Medications  . ciprofloxacin (CIPRO) 500 MG tablet    Sig: Take 1 tablet (500 mg total) by mouth 2 (two) times daily.    Dispense:  20 tablet    Refill:  0    Order Specific Question:   Supervising Provider    Answer:   Lavera Guise [[id number]]  . metroNIDAZOLE (FLAGYL) 500 MG tablet    Sig: Take 1 tablet (500 mg total) by mouth 3 (three) times daily.    Dispense:  21 tablet    Refill:  0    Order Specific Question:   Supervising Provider    Answer:   Lavera Guise [[id number]]    Time spent: 36 Minutes    Dr Lavera Guise Internal medicine

## 2018-08-16 DIAGNOSIS — R1012 Left upper quadrant pain: Secondary | ICD-10-CM | POA: Insufficient documentation

## 2018-08-16 DIAGNOSIS — K529 Noninfective gastroenteritis and colitis, unspecified: Secondary | ICD-10-CM | POA: Insufficient documentation

## 2018-08-19 ENCOUNTER — Other Ambulatory Visit: Payer: Self-pay

## 2018-08-20 ENCOUNTER — Encounter: Payer: Self-pay | Admitting: Oncology

## 2018-08-20 ENCOUNTER — Other Ambulatory Visit: Payer: Self-pay

## 2018-08-20 ENCOUNTER — Inpatient Hospital Stay: Payer: Medicare Other | Attending: Oncology | Admitting: Oncology

## 2018-08-20 ENCOUNTER — Inpatient Hospital Stay: Payer: Medicare Other

## 2018-08-20 DIAGNOSIS — I10 Essential (primary) hypertension: Secondary | ICD-10-CM | POA: Diagnosis not present

## 2018-08-20 DIAGNOSIS — K219 Gastro-esophageal reflux disease without esophagitis: Secondary | ICD-10-CM | POA: Diagnosis not present

## 2018-08-20 DIAGNOSIS — M2550 Pain in unspecified joint: Secondary | ICD-10-CM | POA: Insufficient documentation

## 2018-08-20 DIAGNOSIS — Z794 Long term (current) use of insulin: Secondary | ICD-10-CM | POA: Diagnosis not present

## 2018-08-20 DIAGNOSIS — R7401 Elevation of levels of liver transaminase levels: Secondary | ICD-10-CM

## 2018-08-20 DIAGNOSIS — Z7982 Long term (current) use of aspirin: Secondary | ICD-10-CM | POA: Diagnosis not present

## 2018-08-20 DIAGNOSIS — F329 Major depressive disorder, single episode, unspecified: Secondary | ICD-10-CM | POA: Diagnosis not present

## 2018-08-20 DIAGNOSIS — R740 Nonspecific elevation of levels of transaminase and lactic acid dehydrogenase [LDH]: Secondary | ICD-10-CM | POA: Diagnosis not present

## 2018-08-20 DIAGNOSIS — E785 Hyperlipidemia, unspecified: Secondary | ICD-10-CM | POA: Insufficient documentation

## 2018-08-20 DIAGNOSIS — I252 Old myocardial infarction: Secondary | ICD-10-CM | POA: Diagnosis not present

## 2018-08-20 DIAGNOSIS — F17210 Nicotine dependence, cigarettes, uncomplicated: Secondary | ICD-10-CM | POA: Insufficient documentation

## 2018-08-20 DIAGNOSIS — R7989 Other specified abnormal findings of blood chemistry: Secondary | ICD-10-CM

## 2018-08-20 DIAGNOSIS — K760 Fatty (change of) liver, not elsewhere classified: Secondary | ICD-10-CM | POA: Diagnosis not present

## 2018-08-20 DIAGNOSIS — Z79899 Other long term (current) drug therapy: Secondary | ICD-10-CM | POA: Diagnosis not present

## 2018-08-20 DIAGNOSIS — E119 Type 2 diabetes mellitus without complications: Secondary | ICD-10-CM | POA: Diagnosis not present

## 2018-08-20 LAB — IRON AND TIBC
Iron: 88 ug/dL (ref 45–182)
Saturation Ratios: 27 % (ref 17.9–39.5)
TIBC: 328 ug/dL (ref 250–450)
UIBC: 240 ug/dL

## 2018-08-20 LAB — COMPREHENSIVE METABOLIC PANEL
ALT: 73 U/L — ABNORMAL HIGH (ref 0–44)
AST: 69 U/L — ABNORMAL HIGH (ref 15–41)
Albumin: 3.8 g/dL (ref 3.5–5.0)
Alkaline Phosphatase: 91 U/L (ref 38–126)
Anion gap: 8 (ref 5–15)
BUN: 14 mg/dL (ref 6–20)
CO2: 25 mmol/L (ref 22–32)
Calcium: 9.1 mg/dL (ref 8.9–10.3)
Chloride: 106 mmol/L (ref 98–111)
Creatinine, Ser: 1.01 mg/dL (ref 0.61–1.24)
GFR calc Af Amer: >60 mL/min (ref >60)
GFR calc non Af Amer: >60 mL/min (ref >60)
Glucose, Bld: 165 mg/dL — ABNORMAL HIGH (ref 70–99)
Potassium: 4.3 mmol/L (ref 3.5–5.1)
Sodium: 139 mmol/L (ref 135–145)
Total Bilirubin: 0.5 mg/dL (ref 0.3–1.2)
Total Protein: 8.3 g/dL — ABNORMAL HIGH (ref 6.5–8.1)

## 2018-08-20 LAB — CBC WITH DIFFERENTIAL/PLATELET
Abs Immature Granulocytes: 0.07 10*3/uL (ref 0.00–0.07)
Basophils Absolute: 0.1 10*3/uL (ref 0.0–0.1)
Basophils Relative: 1 %
Eosinophils Absolute: 0.2 10*3/uL (ref 0.0–0.5)
Eosinophils Relative: 2 %
HCT: 43.1 % (ref 39.0–52.0)
Hemoglobin: 14.7 g/dL (ref 13.0–17.0)
Immature Granulocytes: 1 %
Lymphocytes Relative: 24 %
Lymphs Abs: 2.2 10*3/uL (ref 0.7–4.0)
MCH: 30.4 pg (ref 26.0–34.0)
MCHC: 34.1 g/dL (ref 30.0–36.0)
MCV: 89.2 fL (ref 80.0–100.0)
Monocytes Absolute: 0.7 10*3/uL (ref 0.1–1.0)
Monocytes Relative: 8 %
Neutro Abs: 6 10*3/uL (ref 1.7–7.7)
Neutrophils Relative %: 64 %
Platelets: 214 10*3/uL (ref 150–400)
RBC: 4.83 MIL/uL (ref 4.22–5.81)
RDW: 13.2 % (ref 11.5–15.5)
WBC: 9.2 10*3/uL (ref 4.0–10.5)
nRBC: 0 % (ref 0.0–0.2)

## 2018-08-20 LAB — SEDIMENTATION RATE: Sed Rate: 40 mm/hr — ABNORMAL HIGH (ref 0–20)

## 2018-08-20 LAB — C-REACTIVE PROTEIN: CRP: 1.5 mg/dL — ABNORMAL HIGH (ref <1.0)

## 2018-08-20 LAB — FERRITIN: Ferritin: 318 ng/mL (ref 24–336)

## 2018-08-20 NOTE — Progress Notes (Signed)
HEMATOLOGY-ONCOLOGY TeleHEALTH VISIT [place]  NOTE  I connected with [name] on 08/20/18 at  9:30 AM EDT by video enabled telemedicine visit and verified that I am speaking with the correct person using two identifiers. I discussed the limitations, risks, security and privacy concerns of performing an evaluation and management service by telemedicine and the availability of in-person appointments. I also discussed with the patient that there may be a patient responsible charge related to this service. The patient expressed understanding and agreed to proceed.   Other persons participating in the visit and their role in the encounter:  Janeann Merl, RN, check in patient.   Patient's location: Home  Provider's location: home  Referring provider Dr.Tahiliani  Chief Complaint: Initial consultation for evaluation of elevated ferritin level, heterozygous hemochromatosis mutation   HISTORY OF PRESENT ILLNESS Raymond [name] is a 53 y.o. male who has above history reviewed by me today presents for follow up visit for Initial consultation for evaluation of elevated ferritin level, heterozygous hemochromatosis mutation. Patient reports that he is currently on antibiotics for diverticulitis.  Chronic cough, productive with clear phlegm. Patient was recently referred by primary care provider to gastroenterology and was seen by Dr. Bonna Gains for evaluation of intermittent bright blood per rectum. Patient was noted to have internal hemorrhoids which are seen during 2017 colonoscopy.  Patient was recommended to use Anusol suppository and consider banding hemorrhoids in the future.  Iron panel was checked to rule out iron deficiency anemia due to chronic bleeding. Work-up includes negative hepatitis panel, normal B12.  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  Elevated ferritin level at 468. 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 Ultrasound right upper quadrant showed diffuse  increased echogenicity throughout the liver consistent with fatty infiltration. Patient was referred by gastroenterology to heme-onc for further evaluation of elevated ferritin and further management.  Patient reports having ongoing intermittent blood in the stool.  Chronic joint pain of finger/hand joints, shoulder and knees. Denies fever, chills, fatigue, night sweating.   Review of Systems  Constitutional: Negative for appetite change, chills, fatigue, fever and unexpected weight change.  HENT:   Negative for hearing loss and voice change.   Eyes: Negative for eye problems and icterus.  Respiratory: Negative for chest tightness, cough and shortness of breath.   Cardiovascular: Negative for chest pain and leg swelling.  Gastrointestinal: Positive for blood in stool. Negative for abdominal distention and abdominal pain.  Endocrine: Negative for hot flashes.  Genitourinary: Negative for difficulty urinating, dysuria and frequency.   Musculoskeletal: Positive for arthralgias.  Skin: Negative for itching and rash.  Neurological: Negative for light-headedness and numbness.  Hematological: Negative for adenopathy. Does not bruise/bleed easily.  Psychiatric/Behavioral: Negative for confusion.    Past Medical History:  Diagnosis Date  . Anxiety   . Depression   . Diabetes mellitus without complication ([place])   . 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 GERD (gastroesophageal reflux disease)   . Headache   . Hyperlipidemia   . Hypertension   . Irregular heart beat unk  . Myocardial infarction ([place])   . PTSD (post-traumatic stress disorder)   . Tachycardia    Past Surgical History:  Procedure Laterality Date  .  ingrown toenail removal    . CLAVICLE SURGERY Right   . COLONOSCOPY  2017  . SHOULDER SURGERY Right   . UPPER GI ENDOSCOPY  2017    Family History  Problem Relation Age of Onset  . Hypertension Mother   . Diabetes type  II Mother   . Diabetes Mother   . COPD Father    . Cancer Father   . Heart disease Father   . Diabetes Sister   . Anxiety disorder Sister   . Depression Sister   . Diabetes Brother   . Anxiety disorder Brother   . Depression Brother   . Diabetes Maternal Aunt   . Diabetes Sister   . Anxiety disorder Sister   . Depression Sister   . Diabetes Sister   . Anxiety disorder Sister   . Depression Sister   . Diabetes Sister   . Anxiety disorder Sister   . Depression Sister   . Colon cancer Maternal Uncle     Social History   Socioeconomic History  . Marital status: Divorced    Spouse name: Not on file  . Number of children: 4  . Years of education: Not on file  . Highest education level: 8th grade  Occupational History  . Not on file  Social Needs  . Financial resource strain: Very hard  . Food insecurity:    Worry: Often true    Inability: Often true  . Transportation needs:    Medical: No    Non-medical: No  Tobacco Use  . Smoking status: Current Every Day Smoker    Packs/day: 2.00    Years: 40.00    Pack years: 80.00    Types: Cigarettes  . Smokeless tobacco: Former Systems developer    Types: Chew  . Tobacco comment: Reports has tried multiple things to help quit and cut back.   Substance and Sexual Activity  . Alcohol use: No    Alcohol/week: 0.0 standard drinks    Frequency: Never  . Drug use: No  . Sexual activity: Not Currently  Lifestyle  . Physical activity:    Days per week: 0 days    Minutes per session: 0 min  . Stress: Very much  Relationships  . Social connections:    Talks on phone: Not on file    Gets together: Not on file    Attends religious service: Never    Active member of club or organization: No    Attends meetings of clubs or organizations: Never    Relationship status: Divorced  . Intimate partner violence:    Fear of current or ex partner: No    Emotionally abused: No    Physically abused: No    Forced sexual activity: No  Other Topics Concern  . Not on file  Social History Narrative   . Not on file    Current Outpatient Medications on File Prior to Visit  Medication Sig Dispense Refill  . ACCU-CHEK FASTCLIX LANCETS MISC yse as directed twice a day 100 each 1  . albuterol (PROAIR HFA) 108 (90 Base) MCG/ACT inhaler Inhale 2-4 puffs by mouth Every 4-6 hours as needed for wheezing, cough, and/or shortness of breath 3 Inhaler 1  . aspirin EC 81 MG tablet Take 81 mg by mouth.    . ciprofloxacin (CIPRO) 500 MG tablet Take 1 tablet (500 mg total) by mouth 2 (two) times daily. 20 tablet 0  . famotidine (PEPCID) 20 MG tablet Take 1 tablet (20 mg total) by mouth 2 (two) times daily. 60 tablet 3  . fluticasone (FLONASE) 50 MCG/ACT nasal spray 2 sprays by Each Nare route daily.    Marland Kitchen glucose blood (ACCU-CHEK AVIVA PLUS) test strip Blood sugar testing TID and as needed. E11.65 100 each 12  . hydrochlorothiazide (HYDRODIURIL) 25 MG tablet  Take 25 mg by mouth daily.     . hydrOXYzine (VISTARIL) 50 MG capsule Take 1 capsule (50 mg total) by mouth at bedtime as needed. FOR SLEEP 30 capsule 1  . insulin aspart (NOVOLOG FLEXPEN) 100 UNIT/ML FlexPen Use as directed per sliding scale. Instructions provided. 15 mL 11  . Insulin Glargine, 2 Unit Dial, (TOUJEO MAX SOLOSTAR) 300 UNIT/ML SOPN Inject 60 Units into the skin daily. 3 pen 5  . metoprolol tartrate (LOPRESSOR) 100 MG tablet Take 1 tablet (100 mg total) by mouth 2 (two) times daily. 180 tablet 0  . metroNIDAZOLE (FLAGYL) 500 MG tablet Take 1 tablet (500 mg total) by mouth 3 (three) times daily. 21 tablet 0  . mirtazapine (REMERON) 45 MG tablet TAKE 1 TABLET BY MOUTH AT BEDTIME 30 tablet 0  . nitroGLYCERIN (NITROSTAT) 0.4 MG SL tablet Place 0.4 mg under the tongue every 5 (five) minutes as needed for chest pain.    . predniSONE (DELTASONE) 20 MG tablet Take 3 tablets (60 mg total) by mouth daily. 15 tablet 0  . QUEtiapine (SEROQUEL) 50 MG tablet Take 1 tablet (50 mg total) by mouth 2 (two) times daily as needed. 60 tablet 1  . simvastatin  (ZOCOR) 40 MG tablet Take 40 mg by mouth at bedtime.    . sitaGLIPtin-metformin (JANUMET) 50-500 MG tablet Take 1 tablet by mouth 2 (two) times daily. 180 tablet 1  . traZODone (DESYREL) 100 MG tablet Take 2 tablets (200 mg total) by mouth at bedtime. 60 tablet 1  . albuterol (PROVENTIL HFA;VENTOLIN HFA) 108 (90 Base) MCG/ACT inhaler Inhale 2-4 puffs by mouth every 4 hours as needed for wheezing, cough, and/or shortness of breath (Patient not taking: Reported on 08/20/2018) 1 Inhaler 1   No current facility-administered medications on file prior to visit.     Allergies  Allergen Reactions  . Onion Anaphylaxis  . Hydrocodone   . Norco [Hydrocodone-Acetaminophen] Itching  . Hydrocodone-Acetaminophen Itching       Observations/Objective: There were no vitals filed for this visit. There is no height or weight on file to calculate BMI.  Pain level 0 Physical Exam  Constitutional: He is oriented to person, place, and time. No distress.  HENT:  Head: Normocephalic and atraumatic.  Pulmonary/Chest: Effort normal. No respiratory distress.  Neurological: He is alert and oriented to person, place, and time.  Psychiatric: Affect normal.    CBC    Component Value Date/Time   WBC 9.2 08/20/2018 1129   RBC 4.83 08/20/2018 1129   HGB 14.7 08/20/2018 1129   HGB 14.5 05/15/2018 1551   HCT 43.1 08/20/2018 1129   HCT 41.5 12/31/2017 1103   PLT 214 08/20/2018 1129   PLT 209 11/28/2013 1506   MCV 89.2 08/20/2018 1129   MCV 85 12/31/2017 1103   MCV 91 11/28/2013 1506   MCH 30.4 08/20/2018 1129   MCHC 34.1 08/20/2018 1129   RDW 13.2 08/20/2018 1129   RDW 15.2 12/31/2017 1103   RDW 13.6 11/28/2013 1506   LYMPHSABS 2.2 08/20/2018 1129   MONOABS 0.7 08/20/2018 1129   EOSABS 0.2 08/20/2018 1129   BASOSABS 0.1 08/20/2018 1129    CMP     Component Value Date/Time   NA 139 08/20/2018 1129   NA 138 07/15/2015 1832   NA 138 11/28/2013 1506   K 4.3 08/20/2018 1129   K 3.7 11/28/2013 1506    CL 106 08/20/2018 1129   CL 105 11/28/2013 1506   CO2 25 08/20/2018 1129  CO2 26 11/28/2013 1506   GLUCOSE 165 (H) 08/20/2018 1129   GLUCOSE 163 (H) 11/28/2013 1506   BUN 14 08/20/2018 1129   BUN 12 07/15/2015 1832   BUN 12 11/28/2013 1506   CREATININE 1.01 08/20/2018 1129   CREATININE 1.25 11/28/2013 1506   CALCIUM 9.1 08/20/2018 1129   CALCIUM 8.5 11/28/2013 1506   PROT 8.3 (H) 08/20/2018 1129   PROT 8.3 06/10/2018 1129   PROT 8.0 12/08/2012 0914   ALBUMIN 3.8 08/20/2018 1129   ALBUMIN 4.5 06/10/2018 1129   ALBUMIN 4.1 12/08/2012 0914   AST 69 (H) 08/20/2018 1129   AST 28 12/08/2012 0914   ALT 73 (H) 08/20/2018 1129   ALT 42 12/08/2012 0914   ALKPHOS 91 08/20/2018 1129   ALKPHOS 118 12/08/2012 0914   BILITOT 0.5 08/20/2018 1129   BILITOT <0.2 06/10/2018 1129   BILITOT 0.3 12/08/2012 0914   GFRNONAA >60 08/20/2018 1129   GFRNONAA >60 11/28/2013 1506   GFRAA >60 08/20/2018 1129   GFRAA >60 11/28/2013 1506    RADIOGRAPHIC STUDIES: I have personally reviewed the radiological images as listed and agreed with the findings in the report. 07/21/2017 CT chest abdomen pelvis No evidence of acute traumatic injury to the chest, abdomen or pelvis. Hepatic steatosis. Benign-appearing left renal cyst. Mild left colonic diverticulosis.  06/20/2018 US abdomen limited RUQ Fatty liver. Decompressed gallbladder with a gallstone   07/09/2018 DG chest 2 view Borderline heart size.  No active disease.   Assessment and Plan: 1. Elevated ferritin   2. Arthralgia, unspecified joint   3. Transaminitis   4. Fatty liver     Labs reviewed and discussed with patient. Heterozygous hemochromatosis mutation discussed with patient. Her ferritin is elevated at 483.  Normal TSAT Discussed with patient that elevated ferritin can be secondary to acute or chronic inflammation or secondary to iron overload A good proportion of heterozygous hemochromatosis carrier are asymptomatic. Given that his  iron is only mildly elevated with normal TSAT, no urgent need for phlebotomy.  Chronic arthralgia, etiology unknown.  Check ESR, CRP, ANA with reflex.  Transaminitis likely secondary to fatty liver disease.  Continue follow-up with gastroenterology.  Discussed with patient about healthy diet.  Orders Placed This Encounter  Procedures  . CBC with Differential/Platelet    Standing Status:   Future    Number of Occurrences:   1    Standing Expiration Date:   08/20/2019  . Comprehensive metabolic panel    Standing Status:   Future    Number of Occurrences:   1    Standing Expiration Date:   08/20/2019  . Iron and TIBC    Standing Status:   Future    Number of Occurrences:   1    Standing Expiration Date:   08/20/2019  . Ferritin    Standing Status:   Future    Number of Occurrences:   1    Standing Expiration Date:   08/20/2019  . Sedimentation rate    Standing Status:   Future    Number of Occurrences:   1    Standing Expiration Date:   08/20/2019  . C-reactive protein    Standing Status:   Future    Number of Occurrences:   1    Standing Expiration Date:   08/20/2019  . ANA, IFA (with reflex)    Standing Status:   Future    Number of Occurrences:   1    Standing Expiration Date:   08/20/2019  Follow Up Instructions: Follow up in 3 months for lab- MD assessment.   I discussed the assessment and treatment plan with the patient. The patient was provided an opportunity to ask questions and all were answered. The patient agreed with the plan and demonstrated an understanding of the instructions.  The patient was advised to call back or seek an in-person evaluation if the symptoms worsen or if the condition fails to improve as anticipated.    Earlie Server, MD 08/20/2018 9:09 PM

## 2018-08-20 NOTE — Progress Notes (Signed)
Patient contacted for webex visit. He is currently on antibiotic for diverticulitis. Pt has been coughing up phlegm for the past 4-5 weeks. Cough was getting better but seems to be getting worse again.

## 2018-08-21 ENCOUNTER — Ambulatory Visit: Payer: Medicare Other | Admitting: Licensed Clinical Social Worker

## 2018-08-21 LAB — ANTINUCLEAR ANTIBODIES, IFA: ANA Ab, IFA: NEGATIVE

## 2018-08-26 ENCOUNTER — Ambulatory Visit (INDEPENDENT_AMBULATORY_CARE_PROVIDER_SITE_OTHER): Payer: Medicare Other | Admitting: Licensed Clinical Social Worker

## 2018-08-26 ENCOUNTER — Other Ambulatory Visit: Payer: Self-pay

## 2018-08-26 ENCOUNTER — Encounter: Payer: Self-pay | Admitting: Licensed Clinical Social Worker

## 2018-08-26 DIAGNOSIS — F4310 Post-traumatic stress disorder, unspecified: Secondary | ICD-10-CM

## 2018-08-26 NOTE — Progress Notes (Signed)
Virtual Visit via Telephone Note  I connected with Raymond [name] on 08/26/18 at 12:30 PM EDT by telephone and verified that I am speaking with the correct person using two identifiers.   I discussed the limitations, risks, security and privacy concerns of performing an evaluation and management service by telephone and the availability of in person appointments. I also discussed with the patient that there may be a patient responsible charge related to this service. The patient expressed understanding and agreed to proceed.  I discussed the assessment and treatment plan with the patient. The patient was provided an opportunity to ask questions and all were answered. The patient agreed with the plan and demonstrated an understanding of the instructions.   The patient was advised to call back or seek an in-person evaluation if the symptoms worsen or if the condition fails to improve as anticipated.  I provided 20 minutes of non-face-to-face time during this encounter.   Alden Hipp, LCSW    THERAPIST PROGRESS NOTE  Session Time: 1230  Participation Level: Minimal  Behavioral Response: NAAlertAnxious  Type of Therapy: Individual Therapy  Treatment Goals addressed: Coping  Interventions: Strength-based  Summary: Raymond [name] is a 53 y.o. male who presents with continued symptoms of his diagnosis. Raymond [name] reports doing well since our last session. He states, "things have really settled down since the last time we talked." Raymond [name] reports one positive thing that happened since our last session was, "I went and got myself a new phone and finally spent some money on myself instead of everyone else." LCSW validated that action and encouraged Raymond [name] to practice that behavior more frequently, as during quarantine it is even more important to engage in self care. Raymond [name] expressed understanding and agreement with this idea. Further, Raymond [name] reports the only thing he has struggled with  since our last conversation was, "My sister keeps calling from her nursing home and wanting [place] to come get her--but we can't take care of her. They also have a COVID outbreak there, and they wouldn't let us take her even if we could." LCSW validated and normalized those feelings. This led to a discussion around self care--which includes setting appropriate boundaries and prioritizing our own mental health at times. Raymond [name] was able to understand this and stated he has limited conversations with his sister to attempt to improve his anxiety. Raymond [name] reports he has been playing games on his phone to distract himself when he gets anxious, which he states has helped a lot, "I just wipe out everything else I'm thinking about and just focus on that."   Suicidal/Homicidal: No  Therapist Response: Raymond [name] continues to work towards his tx goals but has not yet reached them. We will continue to work on emotional regulation skills and decreasing anxiety in the moment.   Plan: Return again in 4 weeks.  Diagnosis: Axis I: Post Traumatic Stress Disorder    Axis II: No diagnosis    Alden Hipp, LCSW 08/26/2018

## 2018-08-27 ENCOUNTER — Other Ambulatory Visit: Payer: Self-pay | Admitting: Psychiatry

## 2018-08-27 DIAGNOSIS — F5105 Insomnia due to other mental disorder: Secondary | ICD-10-CM

## 2018-09-06 ENCOUNTER — Ambulatory Visit (INDEPENDENT_AMBULATORY_CARE_PROVIDER_SITE_OTHER): Payer: Medicare Other | Admitting: Psychiatry

## 2018-09-06 ENCOUNTER — Ambulatory Visit: Payer: Medicare Other | Admitting: Gastroenterology

## 2018-09-06 ENCOUNTER — Encounter: Payer: Self-pay | Admitting: Psychiatry

## 2018-09-06 ENCOUNTER — Other Ambulatory Visit: Payer: Self-pay

## 2018-09-06 DIAGNOSIS — F4310 Post-traumatic stress disorder, unspecified: Secondary | ICD-10-CM

## 2018-09-06 DIAGNOSIS — F331 Major depressive disorder, recurrent, moderate: Secondary | ICD-10-CM | POA: Diagnosis not present

## 2018-09-06 DIAGNOSIS — F5105 Insomnia due to other mental disorder: Secondary | ICD-10-CM | POA: Diagnosis not present

## 2018-09-06 MED ORDER — TRAZODONE HCL 100 MG PO TABS: 250.0000 mg | ORAL_TABLET | Freq: Every day | ORAL | 1 refills | Status: DC

## 2018-09-06 MED ORDER — QUETIAPINE FUMARATE 50 MG PO TABS: 50.0000 mg | ORAL_TABLET | Freq: Two times a day (BID) | ORAL | 1 refills | Status: DC | PRN

## 2018-09-06 MED ORDER — MIRTAZAPINE 45 MG PO TABS: 45.0000 mg | ORAL_TABLET | Freq: Every day | ORAL | 1 refills | Status: DC

## 2018-09-06 NOTE — Progress Notes (Signed)
Virtual Visit via Video Note  I connected with Raymond [name] on 09/06/18 at 11:30 AM EDT by a video enabled telemedicine application and verified that I am speaking with the correct person using two identifiers.   I discussed the limitations of evaluation and management by telemedicine and the availability of in person appointments. The patient expressed understanding and agreed to proceed.    I discussed the assessment and treatment plan with the patient. The patient was provided an opportunity to ask questions and all were answered. The patient agreed with the plan and demonstrated an understanding of the instructions.   The patient was advised to call back or seek an in-person evaluation if the symptoms worsen or if the condition fails to improve as anticipated.  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  Patient today reports that his nephew got killed recently.  It was a shock to him.  Patient reports he had trouble coping with it initially however he is getting better now.  He had a discussion with his therapist and has upcoming appointment with her again.  That helped.  Patient reports he has been making use of his Seroquel as needed during the day since his nephew's death.  He reports  Seroquel is helpful.  He denies any side effects.  He continues to take his mirtazapine and is compliant with it.  He reports he continues to struggle with sleep.  He has been sleeping long hours however when he wakes up he does not feel rested.  He does have OSA however reports he is compliant with CPAP.  He continues to take hydroxyzine, trazodone  for sleep.  He is on mirtazapine however is at a higher dosage.  Discussed with patient about readjusting the trazodone dosage.  He agrees with plan.  Patient denies any suicidality, homicidality or perceptual disturbances.  Patient denies any other concerns today. Visit Diagnosis:    ICD-10-CM   1. PTSD (post-traumatic stress disorder) F43.10 QUEtiapine (SEROQUEL) 50 MG tablet  2. MDD (major depressive disorder), recurrent episode, moderate (HCC) F33.1 QUEtiapine (SEROQUEL) 50 MG tablet  3. Insomnia due to mental disorder F51.05 traZODone (DESYREL) 100 MG tablet    mirtazapine (REMERON) 45 MG tablet    Past Psychiatric History: I have reviewed past psychiatric history from my progress note on 05/23/2018.  Past trials of Prozac, Zoloft, Celexa, Tegretol, doxepin, Klonopin, Seroquel, mirtazapine, Lunesta, Ambien.  Past Medical History:  Past Medical History:  Diagnosis Date  . Anxiety   . Depression   . Diabetes mellitus without complication ([place])   . 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 GERD (gastroesophageal reflux disease)   . Headache   . Hyperlipidemia   . Hypertension   . Irregular heart beat unk  . Myocardial infarction ([place])   . PTSD (post-traumatic stress disorder)   . Tachycardia     Past Surgical History:  Procedure Laterality Date  .  ingrown toenail removal    . CLAVICLE SURGERY Right   . COLONOSCOPY  2017  . SHOULDER SURGERY Right   . UPPER GI ENDOSCOPY  2017    Family Psychiatric History: I have reviewed family psychiatric history  from my progress note on 05/23/2018.  Family History:  Family History  Problem Relation Age of Onset  . Hypertension Mother   . Diabetes type II Mother   . Diabetes Mother   . COPD Father   . Cancer Father   . Heart disease Father   . Diabetes Sister   . Anxiety disorder Sister   . Depression Sister   . Diabetes Brother   . Anxiety disorder Brother   . Depression Brother   . Diabetes Maternal Aunt   . Diabetes  Sister   . Anxiety disorder Sister   . Depression Sister   . Diabetes Sister   . Anxiety disorder Sister   . Depression Sister   . Diabetes Sister   . Anxiety disorder Sister   . Depression Sister   . Colon cancer Maternal Uncle     Social History: Reviewed social history from my progress note on 05/23/2018. Social History   Socioeconomic History  . Marital status: Divorced    Spouse name: Not on file  . Number of children: 4  . Years of education: Not on file  . Highest education level: 8th grade  Occupational History  . Not on file  Social Needs  . Financial resource strain: Very hard  . Food insecurity:    Worry: Often true    Inability: Often true  . Transportation needs:    Medical: No    Non-medical: No  Tobacco Use  . Smoking status: Current Every Day Smoker    Packs/day: 2.00    Years: 40.00    Pack years: 80.00    Types: Cigarettes  . Smokeless tobacco: Former Systems developer    Types: Chew  . Tobacco comment: Reports has tried multiple things to help quit and cut back.   Substance and Sexual Activity  . Alcohol use: No    Alcohol/week: 0.0 standard drinks    Frequency: Never  . Drug use: No  . Sexual activity: Not Currently  Lifestyle  . Physical activity:    Days per week: 0 days    Minutes per session: 0 min  . Stress: Very much  Relationships  . Social connections:    Talks on phone: Not on file    Gets together: Not on file    Attends religious service: Never    Active member of club or organization: No    Attends meetings of clubs or organizations: Never    Relationship status: Divorced  Other Topics Concern  . Not on file  Social History Narrative  . Not on file    Allergies:  Allergies  Allergen Reactions  . Onion Anaphylaxis  . Hydrocodone   . Norco [Hydrocodone-Acetaminophen] Itching  . Hydrocodone-Acetaminophen Itching    Metabolic Disorder Labs: Lab Results  Component Value Date   HGBA1C 10.5 (A) 07/04/2018   MPG 294.83 05/31/2017    No results found for: PROLACTIN Lab Results  Component Value Date   CHOL 134 07/15/2015   TRIG 227 (H) 07/15/2015   HDL 28 (L) 07/15/2015   CHOLHDL 4.8 07/15/2015   VLDL 26 12/09/2012   LDLCALC 61 07/15/2015   LDLCALC 96 12/09/2012   Lab Results  Component Value Date   TSH 4.120 06/10/2018   TSH 2.000 07/15/2015    Therapeutic Level Labs: No results found for: LITHIUM No results found for: VALPROATE No components found for:  CBMZ  Current Medications: Current Outpatient Medications  Medication Sig Dispense Refill  . ACCU-CHEK FASTCLIX LANCETS MISC yse as directed  twice a day 100 each 1  . albuterol (PROAIR HFA) 108 (90 Base) MCG/ACT inhaler Inhale 2-4 puffs by mouth Every 4-6 hours as needed for wheezing, cough, and/or shortness of breath 3 Inhaler 1  . albuterol (PROVENTIL HFA;VENTOLIN HFA) 108 (90 Base) MCG/ACT inhaler Inhale 2-4 puffs by mouth every 4 hours as needed for wheezing, cough, and/or shortness of breath (Patient not taking: Reported on 08/20/2018) 1 Inhaler 1  . aspirin EC 81 MG tablet Take 81 mg by mouth.    . ciprofloxacin (CIPRO) 500 MG tablet Take 1 tablet (500 mg total) by mouth 2 (two) times daily. 20 tablet 0  . famotidine (PEPCID) 20 MG tablet Take 1 tablet (20 mg total) by mouth 2 (two) times daily. 60 tablet 3  . fluticasone (FLONASE) 50 MCG/ACT nasal spray 2 sprays by Each Nare route daily.    Marland Kitchen glucose blood (ACCU-CHEK AVIVA PLUS) test strip Blood sugar testing TID and as needed. E11.65 100 each 12  . hydrochlorothiazide (HYDRODIURIL) 25 MG tablet Take 25 mg by mouth daily.     . hydrOXYzine (VISTARIL) 50 MG capsule Take 1 capsule (50 mg total) by mouth at bedtime as needed. FOR SLEEP 30 capsule 1  . insulin aspart (NOVOLOG FLEXPEN) 100 UNIT/ML FlexPen Use as directed per sliding scale. Instructions provided. 15 mL 11  . Insulin Glargine, 2 Unit Dial, (TOUJEO MAX SOLOSTAR) 300 UNIT/ML SOPN Inject 60 Units into the skin daily. 3 pen 5  . metoprolol  tartrate (LOPRESSOR) 100 MG tablet Take 1 tablet (100 mg total) by mouth 2 (two) times daily. 180 tablet 0  . metroNIDAZOLE (FLAGYL) 500 MG tablet Take 1 tablet (500 mg total) by mouth 3 (three) times daily. 21 tablet 0  . mirtazapine (REMERON) 45 MG tablet Take 1 tablet (45 mg total) by mouth at bedtime. 30 tablet 1  . nitroGLYCERIN (NITROSTAT) 0.4 MG SL tablet Place 0.4 mg under the tongue every 5 (five) minutes as needed for chest pain.    . predniSONE (DELTASONE) 20 MG tablet Take 3 tablets (60 mg total) by mouth daily. 15 tablet 0  . QUEtiapine (SEROQUEL) 50 MG tablet Take 1 tablet (50 mg total) by mouth 2 (two) times daily as needed. 60 tablet 1  . simvastatin (ZOCOR) 40 MG tablet Take 40 mg by mouth at bedtime.    . sitaGLIPtin-metformin (JANUMET) 50-500 MG tablet Take 1 tablet by mouth 2 (two) times daily. 180 tablet 1  . traZODone (DESYREL) 100 MG tablet Take 2.5 tablets (250 mg total) by mouth at bedtime. 75 tablet 1   No current facility-administered medications for this visit.      Musculoskeletal: Strength & Muscle Tone: UTA Gait & Station: UTA Patient leans: N/A  Psychiatric Specialty Exam: Review of Systems  Psychiatric/Behavioral: The patient is nervous/anxious and has insomnia.   All other systems reviewed and are negative.   There were no vitals taken for this visit.There is no height or weight on file to calculate BMI.  General Appearance: Casual  Eye Contact:  Fair  Speech:  Clear and Coherent  Volume:  Normal  Mood:  Anxious  Affect:  Congruent  Thought Process:  Goal Directed and Descriptions of Associations: Intact  Orientation:  Full (Time, Place, and Person)  Thought Content: Logical   Suicidal Thoughts:  No  Homicidal Thoughts:  No  Memory:  Immediate;   Fair Recent;   Fair Remote;   Fair  Judgement:  Fair  Insight:  Fair  Psychomotor Activity:  Normal  Concentration:  Concentration: Fair and Attention Span: Fair  Recall:  [employer] of Knowledge:  Fair  Language: Fair  Akathisia:  No  Handed:  Right  AIMS (if indicated): Denies tremors, rigidity, stiffness  Assets:  Communication Skills Desire for Improvement Housing Social Support  ADL's:  Intact  Cognition: WNL  Sleep:  Restless   Screenings: Mini-Mental     Clinical Support from 10/05/2017 in [facility], [facility]  Total Score (max 30 points )  30    PHQ2-9     Office Visit from 07/04/2018 in [facility], [facility] Visit from 04/01/2018 in [facility], [facility] Office Visit from 12/31/2017 in [facility], 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  Patient is biologically predisposed given his multiple medical problems, history of trauma.  Patient continues to have psychosocial stressors of the COVID-19 outbreak, his own health issues and the death of his nephew recently.  Patient will continue to benefit from medication readjustment for his sleep.  He will continue psychotherapy sessions.  Plan as noted below.  Plan MDD-improving Mirtazapine 45 mg p.o. nightly Seroquel 50 mg p.o. twice daily as needed for for mood lability. Patient to continue psychotherapy sessions with Ms. Alden Hipp.  PTSD-improving Continue CBT  For insomnia-unstable Increase trazodone to 250 mg p.o. nightly Hydroxyzine 50 mg p.o. nightly as needed Discussed sleep hygiene techniques Continue CPAP for OSA.  Follow-up in clinic in 2 weeks or sooner if needed.  May 29 at 10:45 AM.  I have spent atleast 15 minutes non face to face with patient  today. More than 50 % of the time was spent for psychoeducation and supportive psychotherapy and care coordination.  This note was generated in part or whole with voice recognition software. Voice recognition is usually quite accurate but there are transcription errors that can and very often do occur. I apologize for any typographical errors that were not detected and corrected.        Ursula Alert, MD 09/06/2018, 11:51 AM

## 2018-09-08 ENCOUNTER — Other Ambulatory Visit: Payer: Self-pay | Admitting: Nurse Practitioner

## 2018-09-08 DIAGNOSIS — R197 Diarrhea, unspecified: Secondary | ICD-10-CM | POA: Diagnosis not present

## 2018-09-08 DIAGNOSIS — K529 Noninfective gastroenteritis and colitis, unspecified: Secondary | ICD-10-CM | POA: Diagnosis not present

## 2018-09-13 ENCOUNTER — Other Ambulatory Visit: Payer: Self-pay

## 2018-09-13 MED ORDER — PANTOPRAZOLE SODIUM 40 MG PO TBEC: 40.0000 mg | DELAYED_RELEASE_TABLET | Freq: Every day | ORAL | 5 refills | Status: DC

## 2018-09-20 ENCOUNTER — Other Ambulatory Visit: Payer: Self-pay

## 2018-09-20 ENCOUNTER — Ambulatory Visit (INDEPENDENT_AMBULATORY_CARE_PROVIDER_SITE_OTHER): Payer: Medicare Other | Admitting: Psychiatry

## 2018-09-20 DIAGNOSIS — Z5329 Procedure and treatment not carried out because of patient's decision for other reasons: Secondary | ICD-10-CM

## 2018-09-20 NOTE — Patient Instructions (Signed)
Attempted to call , sent link through [email or web address] - left VM. No response.

## 2018-09-23 NOTE — Progress Notes (Signed)
Attempted to coonect with patient by doxy me as well as phone, no response.

## 2018-09-26 ENCOUNTER — Encounter: Payer: Self-pay | Admitting: Licensed Clinical Social Worker

## 2018-09-26 ENCOUNTER — Ambulatory Visit (INDEPENDENT_AMBULATORY_CARE_PROVIDER_SITE_OTHER): Payer: Medicare Other | Admitting: Licensed Clinical Social Worker

## 2018-09-26 ENCOUNTER — Other Ambulatory Visit: Payer: Self-pay

## 2018-09-26 DIAGNOSIS — F4310 Post-traumatic stress disorder, unspecified: Secondary | ICD-10-CM

## 2018-09-26 DIAGNOSIS — F331 Major depressive disorder, recurrent, moderate: Secondary | ICD-10-CM

## 2018-09-26 NOTE — Progress Notes (Signed)
Virtual Visit via Telephone Note  I connected with Raymond [name] on 09/26/18 at 12:30 PM EDT by telephone and verified that I am speaking with the correct person using two identifiers.   I discussed the limitations, risks, security and privacy concerns of performing an evaluation and management service by telephone and the availability of in person appointments. I also discussed with the patient that there may be a patient responsible charge related to this service. The patient expressed understanding and agreed to proceed.  I discussed the assessment and treatment plan with the patient. The patient was provided an opportunity to ask questions and all were answered. The patient agreed with the plan and demonstrated an understanding of the instructions.   The patient was advised to call back or seek an in-person evaluation if the symptoms worsen or if the condition fails to improve as anticipated.  I provided 30 minutes of non-face-to-face time during this encounter.   Alden Hipp, LCSW    THERAPIST PROGRESS NOTE  Session Time: 1230  Participation Level: Active  Behavioral Response: NAAlertAnxious  Type of Therapy: Individual Therapy  Treatment Goals addressed: Anxiety  Interventions: Supportive  Summary: Raymond [name] is a 53 y.o. male who presents with continued symptoms related to his diagnosis. Raymond [name] reports doing well since our last session. He reports the MD in the clinic recently changed his medications which he reported has significantly decreased his anxiety symptoms. He reports he is better able to sleep through the night, which contributes to his improved mood. LCSW held space for a discussion around the connection between mental health and getting restful sleep. Raymond [name] was able to make this connection and gave several examples of how his mood improves when he is sleeping more consistently. Raymond [name] reports doing well otherwise, "I'm a little stressed with  everything going on in the world. My daughter doesn't want me to see my grandchild if I get to coughing a lot, even though it's just from smoking." LCSW validated and normalized those feelings of frustration and isolation. Raymond [name] expressed understanding why she felt the way she did, but reported missing his grandchild and daughter. LCSW validated those feelings as well. This led to a discussion around what is happening int he world outside COVID-19. Raymond [name] reported he believes this will be a year that's discussed for decades to come. LCSW encouraged Raymond [name] to highlight positive aspects of the year as well. Raymond [name] reported he would work on that moving forward.   Suicidal/Homicidal: No  Therapist Response: Raymond [name] continues to work towards his tx goals but has not yet reached them. We will continue to work on emotional regulation skills moving forward.   Plan: Return again in 4 weeks.  Diagnosis: Axis I: Post Traumatic Stress Disorder    Axis II: No diagnosis    Alden Hipp, LCSW 09/26/2018

## 2018-10-03 ENCOUNTER — Other Ambulatory Visit: Payer: Self-pay

## 2018-10-03 DIAGNOSIS — K219 Gastro-esophageal reflux disease without esophagitis: Secondary | ICD-10-CM

## 2018-10-03 MED ORDER — FAMOTIDINE 20 MG PO TABS: 20.0000 mg | ORAL_TABLET | Freq: Two times a day (BID) | ORAL | 3 refills | Status: DC

## 2018-10-08 ENCOUNTER — Encounter: Payer: Self-pay | Admitting: Nurse Practitioner

## 2018-10-08 ENCOUNTER — Other Ambulatory Visit: Payer: Self-pay

## 2018-10-08 ENCOUNTER — Ambulatory Visit (INDEPENDENT_AMBULATORY_CARE_PROVIDER_SITE_OTHER): Payer: Medicare Other | Admitting: Nurse Practitioner

## 2018-10-08 VITALS — BP 137/90 | HR 82 | Resp 16 | Ht 69.0 in | Wt 248.0 lb

## 2018-10-08 DIAGNOSIS — Z0001 Encounter for general adult medical examination with abnormal findings: Secondary | ICD-10-CM

## 2018-10-08 DIAGNOSIS — R229 Localized swelling, mass and lump, unspecified: Secondary | ICD-10-CM | POA: Diagnosis not present

## 2018-10-08 DIAGNOSIS — R1084 Generalized abdominal pain: Secondary | ICD-10-CM | POA: Diagnosis not present

## 2018-10-08 DIAGNOSIS — R300 Dysuria: Secondary | ICD-10-CM | POA: Diagnosis not present

## 2018-10-08 DIAGNOSIS — E1165 Type 2 diabetes mellitus with hyperglycemia: Secondary | ICD-10-CM

## 2018-10-08 DIAGNOSIS — L600 Ingrowing nail: Secondary | ICD-10-CM

## 2018-10-08 DIAGNOSIS — R197 Diarrhea, unspecified: Secondary | ICD-10-CM | POA: Diagnosis not present

## 2018-10-08 DIAGNOSIS — I10 Essential (primary) hypertension: Secondary | ICD-10-CM

## 2018-10-08 DIAGNOSIS — B351 Tinea unguium: Secondary | ICD-10-CM

## 2018-10-08 LAB — POCT GLYCOSYLATED HEMOGLOBIN (HGB A1C): Hemoglobin A1C: 9.3 % — AB (ref 4.0–5.6)

## 2018-10-08 NOTE — Progress Notes (Signed)
[facility] [place], [place] [address]  Internal MEDICINE  Office Visit Note  Patient Name: Raymond [name]  [id number]  [id number]  Date of Service: 10/08/2018   Pt is here for routine health maintenance examination  Chief Complaint  Patient presents with  . Medical Management of Chronic Issues    having some issue with bowel urgency  . Annual Exam    medicare wellness visit   . Diabetes  . Hyperlipidemia    discuss if medication is needed   . Hypertension     The patient is here for health maintenance exam. Was seen on 08/15/2018 for enterocolitis. Was treated with cipro and flagyl for 10 days. He states that the abdominal pain got much better. Has now moved from the left side of the abdomen to the right. Diarrhea has never resolved. He does see GI specialist for fatty liver disease as well as severe externa hemorrhoids. Blood sugars continue to run rather high, however, they have improved greatly over the past six months. Today, HgbA1c is 9.3. Six months ago, his HgbA1c was 14.5. he is taking Toujeo 60units daily and Janumet 50/500mg  twice daily. Tolerating these medication well.    Current Medication: Outpatient Encounter Medications as of 10/08/2018  Medication Sig Note  . ACCU-CHEK FASTCLIX LANCETS MISC yse as directed twice a day   . albuterol (PROAIR HFA) 108 (90 Base) MCG/ACT inhaler Inhale 2-4 puffs by mouth Every 4-6 hours as needed for wheezing, cough, and/or shortness of breath   . aspirin EC 81 MG tablet Take 81 mg by mouth.   . famotidine (PEPCID) 20 MG tablet Take 1 tablet (20 mg total) by mouth 2 (two) times daily.   . fluticasone (FLONASE) 50 MCG/ACT nasal spray 2 sprays by Each Nare route daily. 07/06/2017: Ran out  and needs new prescription  . glucose blood (ACCU-CHEK AVIVA PLUS) test strip Blood sugar testing TID and as needed. E11.65   . hydrOXYzine (VISTARIL) 50 MG capsule Take 1 capsule (50 mg total) by mouth at bedtime as  needed. FOR SLEEP   . insulin aspart (NOVOLOG FLEXPEN) 100 UNIT/ML FlexPen Use as directed per sliding scale. Instructions provided.   . Insulin Glargine, 2 Unit Dial, (TOUJEO MAX SOLOSTAR) 300 UNIT/ML SOPN Inject 60 Units into the skin daily.   . metoprolol tartrate (LOPRESSOR) 100 MG tablet Take 1 tablet (100 mg total) by mouth 2 (two) times daily.   . mirtazapine (REMERON) 45 MG tablet Take 1 tablet (45 mg total) by mouth at bedtime.   . nitroGLYCERIN (NITROSTAT) 0.4 MG SL tablet Place 0.4 mg under the tongue every 5 (five) minutes as needed for chest pain.   . pantoprazole (PROTONIX) 40 MG tablet Take 1 tablet (40 mg total) by mouth daily.   . QUEtiapine (SEROQUEL) 50 MG tablet Take 1 tablet (50 mg total) by mouth 2 (two) times daily as needed.   . sitaGLIPtin-metformin (JANUMET) 50-500 MG tablet Take 1 tablet by mouth 2 (two) times daily.   . traZODone (DESYREL) 100 MG tablet Take 2.5 tablets (250 mg total) by mouth at bedtime.   . hydrochlorothiazide (HYDRODIURIL) 25 MG tablet Take 25 mg by mouth daily.  05/30/2017: Patient reports taking but no recent fills per pharmacy records   . simvastatin (ZOCOR) 40 MG tablet Take 40 mg by mouth at bedtime.   . [DISCONTINUED] albuterol (PROVENTIL HFA;VENTOLIN HFA) 108 (90 Base) MCG/ACT inhaler Inhale 2-4 puffs by mouth every 4 hours as needed for wheezing, cough,  and/or shortness of breath (Patient not taking: Reported on 08/20/2018)   . [DISCONTINUED] ciprofloxacin (CIPRO) 500 MG tablet Take 1 tablet (500 mg total) by mouth 2 (two) times daily. (Patient not taking: Reported on 10/08/2018)   . [DISCONTINUED] metroNIDAZOLE (FLAGYL) 500 MG tablet Take 1 tablet (500 mg total) by mouth 3 (three) times daily. (Patient not taking: Reported on 10/08/2018)   . [DISCONTINUED] predniSONE (DELTASONE) 20 MG tablet Take 3 tablets (60 mg total) by mouth daily. (Patient not taking: Reported on 10/08/2018)    No facility-administered encounter medications on file as of  10/08/2018.     Surgical History: Past Surgical History:  Procedure Laterality Date  .  ingrown toenail removal    . CLAVICLE SURGERY Right   . COLONOSCOPY  2017  . SHOULDER SURGERY Right   . UPPER GI ENDOSCOPY  2017    Medical History: Past Medical History:  Diagnosis Date  . Anxiety   . Depression   . Diabetes mellitus without complication ([place])   . 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 GERD (gastroesophageal reflux disease)   . Headache   . Hyperlipidemia   . Hypertension   . Irregular heart beat unk  . Myocardial infarction ([place])   . PTSD (post-traumatic stress disorder)   . Tachycardia     Family History: Family History  Problem Relation Age of Onset  . Hypertension Mother   . Diabetes type II Mother   . Diabetes Mother   . COPD Father   . Cancer Father   . Heart disease Father   . Diabetes Sister   . Anxiety disorder Sister   . Depression Sister   . Diabetes Brother   . Anxiety disorder Brother   . Depression Brother   . Diabetes Maternal Aunt   . Diabetes Sister   . Anxiety disorder Sister   . Depression Sister   . Diabetes Sister   . Anxiety disorder Sister   . Depression Sister   . Diabetes Sister   . Anxiety disorder Sister   . Depression Sister   . Colon cancer Maternal Uncle       Review of Systems  Constitutional: Positive for fatigue. Negative for chills and unexpected weight change.  HENT: Positive for congestion and postnasal drip. Negative for rhinorrhea, sneezing and sore throat.   Respiratory: Negative for cough, chest tightness, shortness of breath and wheezing.   Cardiovascular: Negative for chest pain and palpitations.  Gastrointestinal: Positive for abdominal pain, diarrhea and nausea. Negative for constipation and vomiting.  Endocrine: Negative for cold intolerance, heat intolerance, polydipsia and polyuria.       Blood sugars still elevated but continue to improve.  Genitourinary: Negative for dysuria, frequency and  hematuria.  Musculoskeletal: Positive for back pain. Negative for arthralgias, joint swelling and neck pain.  Skin: Negative for rash.  Allergic/Immunologic: Positive for environmental allergies.  Neurological: Negative.  Negative for tremors and numbness.  Hematological: Negative for adenopathy. Does not bruise/bleed easily.  Psychiatric/Behavioral: Negative for behavioral problems (Depression), sleep disturbance and suicidal ideas. The patient is nervous/anxious.      Today's Vitals   10/08/18 0923  BP: 137/90  Pulse: 82  Resp: 16  SpO2: 97%  Weight: 248 lb (112.5 kg)  Height: 5\' 9"  (1.753 m)   Body mass index is 36.62 kg/m.  Physical Exam Vitals signs and nursing note reviewed.  Constitutional:      General: He is not in acute distress.    Appearance: He is well-developed. He is  obese. He is ill-appearing. He is not diaphoretic.  HENT:     Head: Normocephalic and atraumatic.     Nose: Nose normal.     Mouth/Throat:     Pharynx: No oropharyngeal exudate.  Eyes:     Conjunctiva/sclera: Conjunctivae normal.     Pupils: Pupils are equal, round, and reactive to light.  Neck:     Musculoskeletal: Normal range of motion and neck supple.     Thyroid: No thyromegaly.     Vascular: No carotid bruit or JVD.     Trachea: No tracheal deviation.  Cardiovascular:     Rate and Rhythm: Normal rate and regular rhythm.     Pulses: Normal pulses.          Dorsalis pedis pulses are 2+ on the right side and 2+ on the left side.       Posterior tibial pulses are 2+ on the right side and 2+ on the left side.     Heart sounds: Normal heart sounds. No murmur. No friction rub. No gallop.   Pulmonary:     Effort: Pulmonary effort is normal. No respiratory distress.     Breath sounds: Normal breath sounds. No wheezing or rales.  Chest:     Chest wall: No tenderness.  Abdominal:     General: Bowel sounds are normal.     Palpations: Abdomen is soft.     Tenderness: There is abdominal  tenderness.     Comments: Moderate, generalized abdominal tenderness. Guarding and grimacing present with light palpation of the upper abdomen. There is also smooth, palpable mass in left flank/lower abdominal area of the abdomen. This is not tender.  Musculoskeletal: Normal range of motion.     Right foot: Normal range of motion. No deformity.     Left foot: Normal range of motion. No deformity.  Feet:     Right foot:     Protective Sensation: 10 sites tested. 10 sites sensed.     Skin integrity: Skin integrity normal.     Toenail Condition: Right toenails are abnormally thick and long.     Left foot:     Protective Sensation: 10 sites tested. 10 sites sensed.     Skin integrity: Skin integrity normal.     Toenail Condition: Left toenails are abnormally thick and long.  Lymphadenopathy:     Cervical: No cervical adenopathy.  Skin:    General: Skin is warm and dry.     Capillary Refill: Capillary refill takes 2 to 3 seconds.  Neurological:     Mental Status: He is alert and oriented to person, place, and time.     Cranial Nerves: No cranial nerve deficit.  Psychiatric:        Mood and Affect: Mood is anxious and depressed.        Speech: Speech normal.        Behavior: Behavior normal.        Thought Content: Thought content normal.        Judgment: Judgment normal.    Depression screen [facility] 2/9 10/08/2018 07/04/2018 04/01/2018 12/31/2017 10/23/2017  Decreased Interest 0 0 0 0 3  Down, Depressed, Hopeless 0 0 0 0 2  PHQ - 2 Score 0 0 0 0 5  Altered sleeping - - - - 3  Tired, decreased energy - - - - 3  Change in appetite - - - - 3  Feeling bad or failure about yourself  - - - - 2  Trouble concentrating - - - -  3  Moving slowly or fidgety/restless - - - - 0  Suicidal thoughts - - - - 1  PHQ-9 Score - - - - 20  Difficult doing work/chores - - - - -    Functional Status Survey: Is the patient deaf or have difficulty hearing?: No Does the patient have difficulty seeing, even when  wearing glasses/contacts?: No Does the patient have difficulty concentrating, remembering, or making decisions?: Yes Does the patient have difficulty walking or climbing stairs?: Yes Does the patient have difficulty dressing or bathing?: No  MMSE - Mini Mental State Exam 10/08/2018 10/05/2017  Orientation to time 5 5  Orientation to Place 5 5  Registration 0 3  Attention/ Calculation 5 5  Recall 3 3  Language- name 2 objects 2 2  Language- repeat 1 1  Language- follow 3 step command 3 3  Language- read & follow direction 1 1  Write a sentence 1 1  Copy design 1 1  Total score 27 30    Fall Risk  10/08/2018 07/04/2018 06/19/2018 04/01/2018 12/31/2017  Falls in the past year? 0 0 0 0 No  Number falls in past yr: 0 - 0 - -  Injury with Fall? - - - - -  Risk Factor Category  - - - - -  Risk for fall due to : - - - - -  Risk for fall due to: Comment - - - - -  Follow up - - Falls evaluation completed - -      LABS: Recent Results (from the past 2160 hour(s))  ANA, IFA (with reflex)     Status: None   Collection Time: 08/20/18 11:29 AM  Result Value Ref Range   ANA Ab, IFA Negative     Comment: (NOTE)                                     Negative   <1:80                                     Borderline  1:80                                     Positive   >1:80 Performed At: [facility] [place], [place] [id number] Rush Farmer MD QI:[id number]   C-reactive protein     Status: Abnormal   Collection Time: 08/20/18 11:29 AM  Result Value Ref Range   CRP 1.5 (H) <1.0 mg/dL    Comment: Performed at [facility], [address], [place], [place] [address]  Sedimentation rate     Status: Abnormal   Collection Time: 08/20/18 11:29 AM  Result Value Ref Range   Sed Rate 40 (H) 0 - 20 mm/hr    Comment: Performed at [facility], [address] [place], [place] [address]  Ferritin     Status: None   Collection Time: 08/20/18 11:29 AM  Result  Value Ref Range   Ferritin 318 24 - 336 ng/mL    Comment: Performed at [facility], [address] [place], [place] [address]  Iron and TIBC     Status: None   Collection Time: 08/20/18 11:29 AM  Result Value Ref Range  Iron 88 45 - 182 ug/dL   TIBC 328 250 - 450 ug/dL   Saturation Ratios 27 17.9 - 39.5 %   UIBC 240 ug/dL    Comment: Performed at [facility], [address] [place], [place] [address]  Comprehensive metabolic panel     Status: Abnormal   Collection Time: 08/20/18 11:29 AM  Result Value Ref Range   Sodium 139 135 - 145 mmol/L   Potassium 4.3 3.5 - 5.1 mmol/L   Chloride 106 98 - 111 mmol/L   CO2 25 22 - 32 mmol/L   Glucose, Bld 165 (H) 70 - 99 mg/dL   BUN 14 6 - 20 mg/dL   Creatinine, Ser 1.01 0.61 - 1.24 mg/dL   Calcium 9.1 8.9 - 10.3 mg/dL   Total Protein 8.3 (H) 6.5 - 8.1 g/dL   Albumin 3.8 3.5 - 5.0 g/dL   AST 69 (H) 15 - 41 U/L   ALT 73 (H) 0 - 44 U/L   Alkaline Phosphatase 91 38 - 126 U/L   Total Bilirubin 0.5 0.3 - 1.2 mg/dL   GFR calc non Af Amer >60 >60 mL/min   GFR calc Af Amer >60 >60 mL/min   Anion gap 8 5 - 15    Comment: Performed at [facility], [address] [place],  [address]  CBC with Differential/Platelet     Status: None   Collection Time: 08/20/18 11:29 AM  Result Value Ref Range   WBC 9.2 4.0 - 10.5 K/uL   RBC 4.83 4.22 - 5.81 MIL/uL   Hemoglobin 14.7 13.0 - 17.0 g/dL   HCT 43.1 39.0 - 52.0 %   MCV 89.2 80.0 - 100.0 fL   MCH 30.4 26.0 - 34.0 pg   MCHC 34.1 30.0 - 36.0 g/dL   RDW 13.2 11.5 - 15.5 %   Platelets 214 150 - 400 K/uL   nRBC 0.0 0.0 - 0.2 %   Neutrophils Relative % 64 %   Neutro Abs 6.0 1.7 - 7.7 K/uL   Lymphocytes Relative 24 %   Lymphs Abs 2.2 0.7 - 4.0 K/uL   Monocytes Relative 8 %   Monocytes Absolute 0.7 0.1 - 1.0 K/uL   Eosinophils Relative 2 %   Eosinophils Absolute 0.2 0.0 - 0.5 K/uL   Basophils Relative 1 %   Basophils Absolute 0.1 0.0 - 0.1 K/uL   Immature  Granulocytes 1 %   Abs Immature Granulocytes 0.07 0.00 - 0.07 K/uL    Comment: Performed at [facility], [place], [place] [address]  POCT HgB A1C     Status: Abnormal   Collection Time: 10/08/18  9:45 AM  Result Value Ref Range   Hemoglobin A1C 9.3 (A) 4.0 - 5.6 %   HbA1c POC (<> result, manual entry)     HbA1c, POC (prediabetic range)     HbA1c, POC (controlled diabetic range)     Assessment/Plan: 1. Encounter for general adult medical examination with abnormal findings Annual health maintenance exam today.   2. Uncontrolled type 2 diabetes mellitus with hyperglycemia (HCC) - POCT HgB A1C 9.3 today. Improved from 10.5 today. Will have him slowly increase basal insulin to 70units daily. Continue Janumet as prescribed. Use sliding scale, regular insulin as needed and as prescribed.  - Microalbumin, urine   3. Diarrhea, unspecified type Stool studies ordered today. Will get stool for culture, ova, and parasites, and check for c. Diff.   4. Generalized abdominal pain Ultrasound ordered for  further evaluation.  - US Abdomen Complete; Future  5. Local superficial swelling, mass or lump Ultrasound ordered for further evaluation.  - US Abdomen Complete; Future  6. Dysuria - UA/M w/rflx Culture, Routine  7. Essential hypertension Stable. Continue bp medication as prescribed.  General Counseling: Kier verbalizes understanding of the findings of todays visit and agrees with plan of treatment. I have discussed any further diagnostic evaluation that may be needed or ordered today. We also reviewed his medications today. he has been encouraged to call the office with any questions or concerns that should arise related to todays visit.    Counseling:  Diabetes Counseling:  1. Addition of ACE inh/ ARB'S for nephroprotection. Microalbumin is updated  2. Diabetic foot care, prevention of complications. Podiatry consult 3. Exercise and lose weight.  4. Diabetic  eye examination, Diabetic eye exam is updated  5. Monitor blood sugar closlely. nutrition counseling.  6. Sign and symptoms of hypoglycemia including shaking sweating,confusion and headaches.  This patient was seen by Leretha Pol FNP Collaboration with Dr Lavera Guise as a part of collaborative care agreement   Orders Placed This Encounter  Procedures  . US Abdomen Complete  . Microalbumin, urine  . UA/M w/rflx Culture, Routine  . POCT HgB A1C     Time spent: Adams, MD  Internal Medicine

## 2018-10-09 LAB — UA/M W/RFLX CULTURE, ROUTINE
Bilirubin, UA: NEGATIVE
Ketones, UA: NEGATIVE
Leukocytes,UA: NEGATIVE
Nitrite, UA: NEGATIVE
RBC, UA: NEGATIVE
Specific Gravity, UA: >=1.03 — AB (ref 1.005–1.030)
Urobilinogen, Ur: 0.2 mg/dL (ref 0.2–1.0)
pH, UA: 5 (ref 5.0–7.5)

## 2018-10-09 LAB — MICROSCOPIC EXAMINATION
Bacteria, UA: NONE SEEN
Casts: NONE SEEN /lpf

## 2018-10-10 DIAGNOSIS — R197 Diarrhea, unspecified: Secondary | ICD-10-CM | POA: Diagnosis not present

## 2018-10-10 DIAGNOSIS — K529 Noninfective gastroenteritis and colitis, unspecified: Secondary | ICD-10-CM | POA: Diagnosis not present

## 2018-10-10 LAB — MICROALBUMIN, URINE: Microalbumin, Urine: 177.1 ug/mL

## 2018-10-11 LAB — STOOL CULTURE

## 2018-10-11 LAB — OVA AND PARASITE EXAMINATION

## 2018-10-15 LAB — SPECIMEN STATUS REPORT

## 2018-10-15 LAB — STOOL CULTURE: E coli, Shiga toxin Assay: NEGATIVE

## 2018-10-15 LAB — OVA AND PARASITE EXAMINATION

## 2018-10-16 LAB — CLOSTRIDIUM DIFFICILE EIA: C difficile Toxins A+B, EIA: NEGATIVE

## 2018-10-16 LAB — SPECIMEN STATUS REPORT

## 2018-10-22 ENCOUNTER — Ambulatory Visit: Payer: Medicare Other | Admitting: Podiatry

## 2018-10-29 ENCOUNTER — Other Ambulatory Visit: Payer: Self-pay

## 2018-10-29 ENCOUNTER — Ambulatory Visit (INDEPENDENT_AMBULATORY_CARE_PROVIDER_SITE_OTHER): Payer: Medicare Other | Admitting: Podiatry

## 2018-10-29 ENCOUNTER — Encounter: Payer: Self-pay | Admitting: Podiatry

## 2018-10-29 DIAGNOSIS — E0843 Diabetes mellitus due to underlying condition with diabetic autonomic (poly)neuropathy: Secondary | ICD-10-CM | POA: Diagnosis not present

## 2018-10-29 DIAGNOSIS — M79676 Pain in unspecified toe(s): Secondary | ICD-10-CM | POA: Diagnosis not present

## 2018-10-29 DIAGNOSIS — L600 Ingrowing nail: Secondary | ICD-10-CM

## 2018-10-29 DIAGNOSIS — B351 Tinea unguium: Secondary | ICD-10-CM | POA: Diagnosis not present

## 2018-10-29 MED ORDER — GENTAMICIN SULFATE 0.1 % EX CREA: 1.0000 "application " | TOPICAL_CREAM | Freq: Two times a day (BID) | CUTANEOUS | 1 refills | Status: DC

## 2018-10-29 MED ORDER — DOXYCYCLINE HYCLATE 100 MG PO TABS: 100.0000 mg | ORAL_TABLET | Freq: Two times a day (BID) | ORAL | 0 refills | Status: DC

## 2018-10-31 NOTE — Progress Notes (Signed)
   Subjective: Patient presents today for evaluation of pain to the medial borders of the bilateral great toes that began a few weeks ago. Patient is concerned for possible ingrown nail. Touching the toes increases the pain. He has not done anything for treatment.  He is also complaining of elongated, thickened nails that cause pain while ambulating in shoes. He is unable to trim his own nails. Patient is here for further evaluation and treatment.   Past Medical History:  Diagnosis Date  . Anxiety   . Depression   . Diabetes mellitus without complication ([place])   . 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 GERD (gastroesophageal reflux disease)   . Headache   . Hyperlipidemia   . Hypertension   . Irregular heart beat unk  . Myocardial infarction ([place])   . PTSD (post-traumatic stress disorder)   . Tachycardia     Objective:  General: Well developed, nourished, in no acute distress, alert and oriented x3   Dermatology: Skin is warm, dry and supple bilateral. Medial borders of bilateral great toes appears to be erythematous with evidence of an ingrowing nail. Pain on palpation noted to the border of the nail fold. Nails are tender, long, thickened and dystrophic with subungual debris, consistent with onychomycosis, 1-5 bilateral. No signs of infection noted. The remaining nails appear unremarkable at this time. There are no open sores, lesions.  Vascular: Dorsalis Pedis artery and Posterior Tibial artery pedal pulses palpable. No lower extremity edema noted.   Neruologic: Grossly intact via light touch bilateral.  Musculoskeletal: Muscular strength within normal limits in all groups bilateral. Normal range of motion noted to all pedal and ankle joints.   Assesement: #1 Paronychia with ingrowing nail medial borders bilateral great toes #2 Pain in toe #3 Incurvated nail #4 Diabetes Mellitus w/ peripheral neuropathy #5 Onychomycosis of nail due to dermatophyte bilateral   Plan of  Care:  1. Patient evaluated.  2. Discussed treatment alternatives and plan of care. Explained nail avulsion procedure and post procedure course to patient. 3. Patient opted for permanent partial nail avulsion of the medial borders of the bilateral great toes .  4. Prior to procedure, local anesthesia infiltration utilized using 3 ml of a 50:50 mixture of 2% plain lidocaine and 0.5% plain marcaine in a normal hallux block fashion and a betadine prep performed.  5. Partial permanent nail avulsion with chemical matrixectomy performed using [id number] applications of phenol followed by alcohol flush.  6. Light dressing applied. 7. Prescription for Gentamicin cream provided to patient to use daily with a bandage.  8. Prescription for Doxycycline 100 mg #20 provided to patient.  9. Instructed to maintain good pedal hygiene and foot care. Stressed importance of controlling blood sugar.  10. Mechanical debridement of nails 1-5 bilaterally performed using a nail nipper. Filed with dremel without incident.  11. Return to clinic in 2 weeks.   Edrick Kins, DPM [facility]  Dr. Edrick Kins, [place]                                        [place], [place] [address]                Office [phone]  Fax [phone]

## 2018-11-01 ENCOUNTER — Other Ambulatory Visit: Payer: Self-pay

## 2018-11-01 ENCOUNTER — Other Ambulatory Visit: Payer: Medicare Other

## 2018-11-01 ENCOUNTER — Ambulatory Visit: Payer: Medicare Other | Admitting: Gastroenterology

## 2018-11-05 ENCOUNTER — Ambulatory Visit: Payer: Medicare Other | Admitting: Nurse Practitioner

## 2018-11-07 ENCOUNTER — Ambulatory Visit (INDEPENDENT_AMBULATORY_CARE_PROVIDER_SITE_OTHER): Payer: Medicare Other | Admitting: Licensed Clinical Social Worker

## 2018-11-07 ENCOUNTER — Encounter: Payer: Self-pay | Admitting: Licensed Clinical Social Worker

## 2018-11-07 ENCOUNTER — Other Ambulatory Visit: Payer: Self-pay

## 2018-11-07 DIAGNOSIS — F4310 Post-traumatic stress disorder, unspecified: Secondary | ICD-10-CM

## 2018-11-07 NOTE — Progress Notes (Signed)
  Virtual Visit via Video Note  I connected with Raymond [name] on 11/07/18 at 12:30 PM EDT by a video enabled telemedicine application and verified that I am speaking with the correct person using two identifiers.   I discussed the limitations of evaluation and management by telemedicine and the availability of in person appointments. The patient expressed understanding and agreed to proceed.    I discussed the assessment and treatment plan with the patient. The patient was provided an opportunity to ask questions and all were answered. The patient agreed with the plan and demonstrated an understanding of the instructions.   The patient was advised to call back or seek an in-person evaluation if the symptoms worsen or if the condition fails to improve as anticipated.  I provided 20 minutes of non-face-to-face time during this encounter.   Alden Hipp, LCSW   THERAPIST PROGRESS NOTE  Session Time: 1230  Participation Level: Active  Behavioral Response: NeatAlertAnxious  Type of Therapy: Individual Therapy  Treatment Goals addressed: Anxiety  Interventions: Supportive  Summary: Raymond [name] is a 53 y.o. male who presents with continued symptoms of his diagnosis. Raymond [name] reports doing well since our last session. He reports his mood has been much more stable, and he has been adherent with all medications. Raymond [name] reports he recently had surgery on both his big toes for ingrown toe nails. He reports he is currently in pain, but will be happy when they're all healed. He reports he has also been having problems with his stomach and is going to have a ultrasound done to see what is causing his problems. "I'd rather find out what's going on than continue and not know what's going on." Raymond [name] reports he does not have any complaints since our last session, "I've not really been struggling with anything." He reports he has started doing "metal detection, it gets me out of the house and  gives me something to do." Raymond [name] reported feeling happy he did not have much to discuss at today's session. LCSW validated those feelings and encouraged Raymond [name] to recognize this as progress and the effort he's put in. Raymond [name] was able to recognize this progress and expressed happiness he is feeling better. He reports his sleep has improved significantly and he is continuing to feel rested.   Suicidal/Homicidal: No  Therapist Response: Raymond [name] continues to work towards his tx goals but has not yet reached them. We will continue to work towards emotional regulation skills and communication.   Plan: Return again in 6 weeks.  Diagnosis: Axis I: Post Traumatic Stress Disorder    Axis II: No diagnosis    Alden Hipp, LCSW 11/07/2018

## 2018-11-08 ENCOUNTER — Other Ambulatory Visit: Payer: Self-pay

## 2018-11-08 ENCOUNTER — Ambulatory Visit: Payer: Medicare Other

## 2018-11-08 DIAGNOSIS — R1084 Generalized abdominal pain: Secondary | ICD-10-CM

## 2018-11-08 DIAGNOSIS — R229 Localized swelling, mass and lump, unspecified: Secondary | ICD-10-CM

## 2018-11-12 ENCOUNTER — Other Ambulatory Visit: Payer: Self-pay

## 2018-11-12 ENCOUNTER — Encounter: Payer: Self-pay | Admitting: Podiatry

## 2018-11-12 ENCOUNTER — Ambulatory Visit (INDEPENDENT_AMBULATORY_CARE_PROVIDER_SITE_OTHER): Payer: Medicare Other | Admitting: Podiatry

## 2018-11-12 VITALS — Temp 98.2°F

## 2018-11-12 DIAGNOSIS — E0843 Diabetes mellitus due to underlying condition with diabetic autonomic (poly)neuropathy: Secondary | ICD-10-CM

## 2018-11-12 DIAGNOSIS — B351 Tinea unguium: Secondary | ICD-10-CM

## 2018-11-12 DIAGNOSIS — M79676 Pain in unspecified toe(s): Secondary | ICD-10-CM | POA: Diagnosis not present

## 2018-11-12 DIAGNOSIS — L600 Ingrowing nail: Secondary | ICD-10-CM | POA: Diagnosis not present

## 2018-11-12 MED ORDER — GABAPENTIN 100 MG PO CAPS: 100.0000 mg | ORAL_CAPSULE | Freq: Three times a day (TID) | ORAL | 3 refills | Status: DC

## 2018-11-13 ENCOUNTER — Ambulatory Visit (INDEPENDENT_AMBULATORY_CARE_PROVIDER_SITE_OTHER): Payer: Medicare Other | Admitting: Gastroenterology

## 2018-11-13 ENCOUNTER — Encounter: Payer: Self-pay | Admitting: Gastroenterology

## 2018-11-13 VITALS — BP 169/91 | HR 85 | Temp 98.2°F | Ht 69.0 in | Wt 247.6 lb

## 2018-11-13 DIAGNOSIS — R1084 Generalized abdominal pain: Secondary | ICD-10-CM

## 2018-11-13 DIAGNOSIS — R748 Abnormal levels of other serum enzymes: Secondary | ICD-10-CM | POA: Diagnosis not present

## 2018-11-13 NOTE — Progress Notes (Signed)
Raymond Antigua, MD [address]  [place]  [place], [place] [address]  Main: [phone]  Fax: [phone]   Primary Care Physician: Ronnell Freshwater, NP   Chief Complaint  Patient presents with  . Elevated Hepatic Enzymes    HPI: Raymond [name] is a 53 y.o. male with previous history of intermittent bright red blood per rectum and elevated liver enzymes and fatty liver here for follow-up.  Patient reports 3 to 106-month history of loose stools.  Underwent stools infectious work-up with primary care provider and was negative.  Also reports left upper and lower quadrant abdominal pain for which an ultrasound abdomen was done last week.  The report is not available yet and I have contacted primary care provider personally and she states the report is not available as it is coming from external facility.  When she has the report she will fax it over to [place].  He is scheduled for hemorrhoid banding with Dr. Marius Ditch due to intermittent bright red blood per rectum  Patient has had previous work-up with EGD and colonoscopy in 2017 at [facility]. At that time he reported hematochezia as well. 4 subcentimeter polyps were removed and were hyperplastic. Internal nonbleeding hemorrhoids were reported. EGD showed grade B reflux esophagitis. Esophageal mucosal changes suspicious for Barrett's were also reported. Pathology report showed no intestinal metaplasia. 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  Current Outpatient Medications  Medication Sig Dispense Refill  . ACCU-CHEK FASTCLIX LANCETS MISC yse as directed twice a day 100 each 1  . albuterol (PROAIR HFA) 108 (90 Base) MCG/ACT inhaler Inhale 2-4 puffs by mouth Every 4-6 hours as needed for wheezing, cough, and/or shortness of breath 3 Inhaler 1  . aspirin EC 81 MG tablet Take 81 mg by mouth.    . doxycycline (VIBRA-TABS) 100 MG tablet Take 1 tablet (100 mg total) by mouth 2 (two) times daily. 20 tablet 0  .  famotidine (PEPCID) 20 MG tablet Take 1 tablet (20 mg total) by mouth 2 (two) times daily. 60 tablet 3  . fluticasone (FLONASE) 50 MCG/ACT nasal spray 2 sprays by Each Nare route daily.    Marland Kitchen gabapentin (NEURONTIN) 100 MG capsule Take 1 capsule (100 mg total) by mouth 3 (three) times daily. 90 capsule 3  . gentamicin cream (GARAMYCIN) 0.1 % Apply 1 application topically 2 (two) times daily. 15 g 1  . glucose blood (ACCU-CHEK AVIVA PLUS) test strip Blood sugar testing TID and as needed. E11.65 100 each 12  . hydrochlorothiazide (HYDRODIURIL) 25 MG tablet Take 25 mg by mouth daily.     . hydrOXYzine (VISTARIL) 50 MG capsule Take 1 capsule (50 mg total) by mouth at bedtime as needed. FOR SLEEP 30 capsule 1  . insulin aspart (NOVOLOG FLEXPEN) 100 UNIT/ML FlexPen Use as directed per sliding scale. Instructions provided. 15 mL 11  . Insulin Glargine, 2 Unit Dial, (TOUJEO MAX SOLOSTAR) 300 UNIT/ML SOPN Inject 60 Units into the skin daily. 3 pen 5  . metoprolol tartrate (LOPRESSOR) 100 MG tablet Take 1 tablet (100 mg total) by mouth 2 (two) times daily. 180 tablet 0  . mirtazapine (REMERON) 45 MG tablet Take 1 tablet (45 mg total) by mouth at bedtime. 30 tablet 1  . nitroGLYCERIN (NITROSTAT) 0.4 MG SL tablet Place 0.4 mg under the tongue every 5 (five) minutes as needed for chest pain.    . pantoprazole (PROTONIX) 40 MG tablet Take 1 tablet (40 mg total) by mouth daily.  30 tablet 5  . QUEtiapine (SEROQUEL) 50 MG tablet Take 1 tablet (50 mg total) by mouth 2 (two) times daily as needed. 60 tablet 1  . sitaGLIPtin-metformin (JANUMET) 50-500 MG tablet Take 1 tablet by mouth 2 (two) times daily. 180 tablet 1  . traZODone (DESYREL) 100 MG tablet Take 2.5 tablets (250 mg total) by mouth at bedtime. 75 tablet 1   No current facility-administered medications for this visit.     Allergies as of 11/13/2018 - Review Complete 11/13/2018  Allergen Reaction Noted  . Onion Anaphylaxis 03/30/2015  . Hydrocodone   01/13/2014  . Norco [hydrocodone-acetaminophen] Itching 11/02/2014  . Hydrocodone-acetaminophen Itching 11/02/2014    ROS:  General: Negative for anorexia, weight loss, fever, chills, fatigue, weakness. ENT: Negative for hoarseness, difficulty swallowing , nasal congestion. CV: Negative for chest pain, angina, palpitations, dyspnea on exertion, peripheral edema.  Respiratory: Negative for dyspnea at rest, dyspnea on exertion, cough, sputum, wheezing.  GI: See history of present illness. GU:  Negative for dysuria, hematuria, urinary incontinence, urinary frequency, nocturnal urination.  Endo: Negative for unusual weight change.    Physical Examination:   BP (!) 169/91   Pulse 85   Temp 98.2 F (36.8 C) (Oral)   Ht 5\' 9"  (1.753 m)   Wt 247 lb 9.6 oz (112.3 kg)   BMI 36.56 kg/m   General: Well-nourished, well-developed in no acute distress.  Eyes: No icterus. Conjunctivae pink. Mouth: Oropharyngeal mucosa moist and pink , no lesions erythema or exudate. Neck: Supple, Trachea midline Abdomen: Bowel sounds are normal, nontender, nondistended, no hepatosplenomegaly or masses, no abdominal bruits or hernia , no rebound or guarding.   Extremities: No lower extremity edema. No clubbing or deformities. Neuro: Alert and oriented x 3.  Grossly intact. Skin: Warm and dry, no jaundice.   Psych: Alert and cooperative, normal mood and affect.   Labs: CMP     Component Value Date/Time   NA 139 08/20/2018 1129   NA 138 07/15/2015 1832   NA 138 11/28/2013 1506   K 4.3 08/20/2018 1129   K 3.7 11/28/2013 1506   CL 106 08/20/2018 1129   CL 105 11/28/2013 1506   CO2 25 08/20/2018 1129   CO2 26 11/28/2013 1506   GLUCOSE 165 (H) 08/20/2018 1129   GLUCOSE 163 (H) 11/28/2013 1506   BUN 14 08/20/2018 1129   BUN 12 07/15/2015 1832   BUN 12 11/28/2013 1506   CREATININE 1.01 08/20/2018 1129   CREATININE 1.25 11/28/2013 1506   CALCIUM 9.1 08/20/2018 1129   CALCIUM 8.5 11/28/2013 1506    PROT 8.3 (H) 08/20/2018 1129   PROT 8.3 06/10/2018 1129   PROT 8.0 12/08/2012 0914   ALBUMIN 3.8 08/20/2018 1129   ALBUMIN 4.5 06/10/2018 1129   ALBUMIN 4.1 12/08/2012 0914   AST 69 (H) 08/20/2018 1129   AST 28 12/08/2012 0914   ALT 73 (H) 08/20/2018 1129   ALT 42 12/08/2012 0914   ALKPHOS 91 08/20/2018 1129   ALKPHOS 118 12/08/2012 0914   BILITOT 0.5 08/20/2018 1129   BILITOT <0.2 06/10/2018 1129   BILITOT 0.3 12/08/2012 0914   GFRNONAA >60 08/20/2018 1129   GFRNONAA >60 11/28/2013 1506   GFRAA >60 08/20/2018 1129   GFRAA >60 11/28/2013 1506   Lab Results  Component Value Date   WBC 9.2 08/20/2018   HGB 14.7 08/20/2018   HCT 43.1 08/20/2018   MCV 89.2 08/20/2018   PLT 214 08/20/2018    Imaging Studies: No results found.  Assessment and Plan:   LOGON UTTECH is a 53 y.o. y/o male with abdominal pain, intermittent bright red blood per rectum and loose stools  Awaiting ultrasound report from last week for further evaluation of abdominal pain  If normal, can order CT abdomen pelvis to rule out any underlying lesions  Continue follow-up with Dr. Marius Ditch for banding  Risk of colon malignancy low given recent colonoscopy within last 3 years  However, if symptoms continue and CT abdomen pelvis negative, can consider colonoscopy for evaluation of microscopic colitis or other etiologies  We will repeat liver enzymes due to elevation in the past  Patient advised to have hepatitis B vaccination done by primary care provider  Finding of fatty liver on imaging discussed with patient Diet, weight loss, and exercise encouraged along with avoiding hepatotoxic drugs including alcohol Risk of progression to cirrhosis if above measures are not instituted were discussed as well, and patient verbalized understanding   Dr Raymond [name]

## 2018-11-14 LAB — COMPREHENSIVE METABOLIC PANEL
ALT: 47 IU/L — ABNORMAL HIGH (ref 0–44)
AST: 57 IU/L — ABNORMAL HIGH (ref 0–40)
Albumin/Globulin Ratio: 1.4 (ref 1.2–2.2)
Albumin: 4.4 g/dL (ref 3.8–4.9)
Alkaline Phosphatase: 125 IU/L — ABNORMAL HIGH (ref 39–117)
BUN/Creatinine Ratio: 9 (ref 9–20)
BUN: 9 mg/dL (ref 6–24)
Bilirubin Total: <0.2 mg/dL (ref 0.0–1.2)
CO2: 23 mmol/L (ref 20–29)
Calcium: 9.7 mg/dL (ref 8.7–10.2)
Chloride: 98 mmol/L (ref 96–106)
Creatinine, Ser: 0.97 mg/dL (ref 0.76–1.27)
GFR calc Af Amer: 102 mL/min/{1.73_m2} (ref >59)
GFR calc non Af Amer: 88 mL/min/{1.73_m2} (ref >59)
Globulin, Total: 3.2 g/dL (ref 1.5–4.5)
Glucose: 308 mg/dL — ABNORMAL HIGH (ref 65–99)
Potassium: 4.6 mmol/L (ref 3.5–5.2)
Sodium: 138 mmol/L (ref 134–144)
Total Protein: 7.6 g/dL (ref 6.0–8.5)

## 2018-11-14 NOTE — Progress Notes (Signed)
   Subjective: 53 y.o. male presents today status post permanent nail avulsion procedure of the medial borders of the bilateral great toes that was performed on 10/29/2018. He states he is doing well. He reports some minimal drainage and tenderness only when pressure is applied to the areas. He has been applying Gentamicin cream as directed. There are no worsening factors noted. Patient is here for further evaluation and treatment.    Past Medical History:  Diagnosis Date  . Anxiety   . Depression   . Diabetes mellitus without complication ([place])   . 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 GERD (gastroesophageal reflux disease)   . Headache   . Hyperlipidemia   . Hypertension   . Irregular heart beat unk  . Myocardial infarction ([place])   . PTSD (post-traumatic stress disorder)   . Tachycardia     Objective: Skin is warm, dry and supple. Nail and respective nail fold appears to be healing appropriately. Open wound to the associated nail fold with a granular wound base and moderate amount of fibrotic tissue. Minimal drainage noted. Mild erythema around the periungual region likely due to phenol chemical matricectomy.  Assessment: #1 postop permanent partial nail avulsion medial border of the bilateral great toes #2 open wound periungual nail fold of respective digit.  #3 Diabetes mellitus with peripheral neuropathy   Plan of care: #1 patient was evaluated  #2 debridement of open wound was performed to the periungual border of the respective toe using a currette. Antibiotic ointment and Band-Aid was applied. #3 Prescription for Gabapentin 100 mg TID provided to patient.  #4 patient is to return to clinic on a PRN basis.   Edrick Kins, DPM [facility]  Dr. Edrick Kins, [place]                                        [place],  [address]                Office [phone]  Fax [phone]

## 2018-11-15 ENCOUNTER — Encounter: Payer: Self-pay | Admitting: Nurse Practitioner

## 2018-11-15 ENCOUNTER — Telehealth: Payer: Self-pay | Admitting: Family Medicine

## 2018-11-15 ENCOUNTER — Ambulatory Visit (INDEPENDENT_AMBULATORY_CARE_PROVIDER_SITE_OTHER): Payer: Medicare Other | Admitting: Gastroenterology

## 2018-11-15 ENCOUNTER — Ambulatory Visit (INDEPENDENT_AMBULATORY_CARE_PROVIDER_SITE_OTHER): Payer: Medicare Other | Admitting: Nurse Practitioner

## 2018-11-15 ENCOUNTER — Other Ambulatory Visit: Payer: Self-pay

## 2018-11-15 ENCOUNTER — Encounter: Payer: Self-pay | Admitting: Gastroenterology

## 2018-11-15 VITALS — BP 155/92 | HR 86 | Resp 16 | Ht 69.0 in | Wt 246.0 lb

## 2018-11-15 VITALS — BP 150/80 | HR 74 | Temp 98.1°F | Ht 69.0 in | Wt 246.2 lb

## 2018-11-15 DIAGNOSIS — E1165 Type 2 diabetes mellitus with hyperglycemia: Secondary | ICD-10-CM | POA: Diagnosis not present

## 2018-11-15 DIAGNOSIS — K8012 Calculus of gallbladder with acute and chronic cholecystitis without obstruction: Secondary | ICD-10-CM | POA: Diagnosis not present

## 2018-11-15 DIAGNOSIS — I10 Essential (primary) hypertension: Secondary | ICD-10-CM

## 2018-11-15 DIAGNOSIS — K641 Second degree hemorrhoids: Secondary | ICD-10-CM | POA: Diagnosis not present

## 2018-11-15 DIAGNOSIS — R1084 Generalized abdominal pain: Secondary | ICD-10-CM

## 2018-11-15 NOTE — Progress Notes (Signed)
Raymond Darby, MD [address]  [place]  [place], [place] [address]  Main: [phone]  Fax: [phone] Pager: [phone]   Primary Care Physician: Ronnell Freshwater, NP  Primary Gastroenterologist:  Dr. Bonna Gains  Chief Complaint  Patient presents with  . Hemorrhoids    Banding #1    HPI: Raymond [name] is a 53 y.o. male seen in consultation for symptomatic hemorrhoids.  His symptoms include intermittent rectal bleeding, rectal pain, discomfort, burning, itching, pressure/swelling.  He does report spending 10 to 20 minutes time in the toilet bowel due to sensation of incomplete emptying.  He does report loose stools.  He consumes red meat regularly, lately using air Rolly Salter to cut back on greasy foods.  He thinks he does not have enough fiber in his diet.  He was told that he has cholelithiasis based on the ultrasound last week and he will follow-up with Dr. Bonna Gains  Current Outpatient Medications  Medication Sig Dispense Refill  . ACCU-CHEK FASTCLIX LANCETS MISC yse as directed twice a day 100 each 1  . albuterol (PROAIR HFA) 108 (90 Base) MCG/ACT inhaler Inhale 2-4 puffs by mouth Every 4-6 hours as needed for wheezing, cough, and/or shortness of breath 3 Inhaler 1  . aspirin EC 81 MG tablet Take 81 mg by mouth.    . famotidine (PEPCID) 20 MG tablet Take 1 tablet (20 mg total) by mouth 2 (two) times daily. 60 tablet 3  . gabapentin (NEURONTIN) 100 MG capsule Take 1 capsule (100 mg total) by mouth 3 (three) times daily. 90 capsule 3  . gentamicin cream (GARAMYCIN) 0.1 % Apply 1 application topically 2 (two) times daily. 15 g 1  . glucose blood (ACCU-CHEK AVIVA PLUS) test strip Blood sugar testing TID and as needed. E11.65 100 each 12  . hydrOXYzine (VISTARIL) 50 MG capsule Take 1 capsule (50 mg total) by mouth at bedtime as needed. FOR SLEEP 30 capsule 1  . insulin aspart (NOVOLOG FLEXPEN) 100 UNIT/ML FlexPen Use as directed per sliding scale. Instructions  provided. 15 mL 11  . Insulin Glargine, 2 Unit Dial, (TOUJEO MAX SOLOSTAR) 300 UNIT/ML SOPN Inject 60 Units into the skin daily. 3 pen 5  . metoprolol tartrate (LOPRESSOR) 100 MG tablet Take 1 tablet (100 mg total) by mouth 2 (two) times daily. 180 tablet 0  . mirtazapine (REMERON) 45 MG tablet Take 1 tablet (45 mg total) by mouth at bedtime. 30 tablet 1  . nitroGLYCERIN (NITROSTAT) 0.4 MG SL tablet Place 0.4 mg under the tongue every 5 (five) minutes as needed for chest pain.    . pantoprazole (PROTONIX) 40 MG tablet Take 1 tablet (40 mg total) by mouth daily. 30 tablet 5  . QUEtiapine (SEROQUEL) 50 MG tablet Take 1 tablet (50 mg total) by mouth 2 (two) times daily as needed. 60 tablet 1  . sitaGLIPtin-metformin (JANUMET) 50-500 MG tablet Take 1 tablet by mouth 2 (two) times daily. 180 tablet 1  . traZODone (DESYREL) 100 MG tablet Take 2.5 tablets (250 mg total) by mouth at bedtime. 75 tablet 1  . fluticasone (FLONASE) 50 MCG/ACT nasal spray 2 sprays by Each Nare route daily.    . hydrochlorothiazide (HYDRODIURIL) 25 MG tablet Take 25 mg by mouth daily.      No current facility-administered medications for this visit.     Allergies as of 11/15/2018 - Review Complete 11/15/2018  Allergen Reaction Noted  . Onion Anaphylaxis 03/30/2015  . Hydrocodone  01/13/2014  . Norco [hydrocodone-acetaminophen]  Itching 11/02/2014  . Hydrocodone-acetaminophen Itching 11/02/2014    NSAIDs: None  Antiplts/Anticoagulants/Anti thrombotics: None  GI procedures: [facility]GD and colon[facility] at [facility] in 05/2015 A: Small bowel, duodenal bulb, biopsy  - Foveolar metaplasia with acute hemorrhage into lamina propria, consistent with procedure artifact - No duodenitis or dysplasia identified  B: Esophagus, biopsy - Squamocolumnar mucosa with mild reflux associated changes - No intestinal metaplasia, dysplasia or gastritis identified  C: Colon, transverse, biopsy  - Hyperplastic polyp (1 fragment)  D: Colon,  rectosigmoid, biopsy  - Hyperplastic polyp (multiple fragments) with focal secondary inflammatory changes   ROS:  General: Negative for anorexia, weight loss, fever, chills, fatigue, weakness. ENT: Negative for hoarseness, difficulty swallowing , nasal congestion. CV: Negative for chest pain, angina, palpitations, dyspnea on exertion, peripheral edema.  Respiratory: Negative for dyspnea at rest, dyspnea on exertion, cough, sputum, wheezing.  GI: See history of present illness. GU:  Negative for dysuria, hematuria, urinary incontinence, urinary frequency, nocturnal urination.  Endo: Negative for unusual weight change.    Physical Examination:   BP (!) 150/80   Pulse 74   Temp 98.1 F (36.7 C) (Oral)   Ht 5\' 9"  (1.753 m)   Wt 246 lb 3.2 oz (111.7 kg)   BMI 36.36 kg/m   General: Well-nourished, well-developed in no acute distress.  Eyes: No icterus. Conjunctivae pink. Mouth: Oropharyngeal mucosa moist and pink , no lesions erythema or exudate. Lungs: Clear to auscultation bilaterally. Non-labored. Heart: Regular rate and rhythm, no murmurs rubs or gallops.  Abdomen: Bowel sounds are normal, obese, nontender, nondistended, no hepatosplenomegaly or masses, no hernia , no rebound or guarding.   Rectal: Digital rectal exam revealed mild tenderness in the anal canal, enlarged hemorrhoids, normal perianal exam, soft brown stool.  Anoscopy was performed which revealed large hemorrhoids Extremities: No lower extremity edema. No clubbing or deformities. Neuro: Alert and oriented x 3.  Grossly intact. Skin: Warm and dry, no jaundice.   Psych: Alert and cooperative, normal mood and affect.   Imaging Studies: No results found.  Assessment and Plan:   Raymond [name] is a 53 y.o. male with metabolic syndrome, fatty liver seen in consultation for symptomatic hemorrhoids, rectal bleeding to evaluate for hemorrhoid ligation.  Patient has grade II hemorrhoids.  He does not have anal fissure.   He failed medical therapy for treatment of hemorrhoids.  Discussed with him about hemorrhoid ligation and he is willing to undergo Risks and benefits discussed, consent obtained Perform hemorrhoid ligation today Encouraged patient to incorporate more fiber in his diet Limit intake of red meat  Follow up in 2 weeks   Dr Sherri Sear, MD

## 2018-11-15 NOTE — Progress Notes (Signed)
[facility] [place],  [address]  Internal MEDICINE  Office Visit Note  Patient Name: Raymond [name]  [id number]  [id number]  Date of Service: 11/15/2018  Chief Complaint  Patient presents with  . Medical Management of Chronic Issues    ultrasound follow up and labs     The patient is here for follow up visit. He continues to have abdominal pain. Today, more on left side than right. This has gone from side to side several times. He continues to have diarrhea and cramping even after treatment with multiple antibiotics. He had ultrasound of the gallbladder done 11/08/2018, which showed contracted gallbladder, filled with stones. He will be seeing surgeon today to band some of his hemorrhoids which have been irritated due to frequent diarrhea. He states his podiatrist has started him on gabapentin, however, he has not started this yet.       Current Medication: Outpatient Encounter Medications as of 11/15/2018  Medication Sig Note  . ACCU-CHEK FASTCLIX LANCETS MISC yse as directed twice a day   . albuterol (PROAIR HFA) 108 (90 Base) MCG/ACT inhaler Inhale 2-4 puffs by mouth Every 4-6 hours as needed for wheezing, cough, and/or shortness of breath   . aspirin EC 81 MG tablet Take 81 mg by mouth.   . famotidine (PEPCID) 20 MG tablet Take 1 tablet (20 mg total) by mouth 2 (two) times daily.   . fluticasone (FLONASE) 50 MCG/ACT nasal spray 2 sprays by Each Nare route daily. 07/06/2017: Ran out  and needs new prescription  . gabapentin (NEURONTIN) 100 MG capsule Take 1 capsule (100 mg total) by mouth 3 (three) times daily.   Marland Kitchen gentamicin cream (GARAMYCIN) 0.1 % Apply 1 application topically 2 (two) times daily.   Marland Kitchen glucose blood (ACCU-CHEK AVIVA PLUS) test strip Blood sugar testing TID and as needed. E11.65   . hydrochlorothiazide (HYDRODIURIL) 25 MG tablet Take 25 mg by mouth daily.  05/30/2017: Patient reports taking but no recent fills per pharmacy records    . hydrOXYzine (VISTARIL) 50 MG capsule Take 1 capsule (50 mg total) by mouth at bedtime as needed. FOR SLEEP   . insulin aspart (NOVOLOG FLEXPEN) 100 UNIT/ML FlexPen Use as directed per sliding scale. Instructions provided.   . Insulin Glargine, 2 Unit Dial, (TOUJEO MAX SOLOSTAR) 300 UNIT/ML SOPN Inject 60 Units into the skin daily.   . metoprolol tartrate (LOPRESSOR) 100 MG tablet Take 1 tablet (100 mg total) by mouth 2 (two) times daily.   . mirtazapine (REMERON) 45 MG tablet Take 1 tablet (45 mg total) by mouth at bedtime.   . nitroGLYCERIN (NITROSTAT) 0.4 MG SL tablet Place 0.4 mg under the tongue every 5 (five) minutes as needed for chest pain.   . pantoprazole (PROTONIX) 40 MG tablet Take 1 tablet (40 mg total) by mouth daily.   . QUEtiapine (SEROQUEL) 50 MG tablet Take 1 tablet (50 mg total) by mouth 2 (two) times daily as needed.   . sitaGLIPtin-metformin (JANUMET) 50-500 MG tablet Take 1 tablet by mouth 2 (two) times daily.   . traZODone (DESYREL) 100 MG tablet Take 2.5 tablets (250 mg total) by mouth at bedtime.   . [DISCONTINUED] doxycycline (VIBRA-TABS) 100 MG tablet Take 1 tablet (100 mg total) by mouth 2 (two) times daily. (Patient not taking: Reported on 11/15/2018)    No facility-administered encounter medications on file as of 11/15/2018.     Surgical History: Past Surgical History:  Procedure Laterality Date  .  ingrown toenail removal    . CLAVICLE SURGERY Right   . COLONOSCOPY  2017  . SHOULDER SURGERY Right   . UPPER GI ENDOSCOPY  2017    Medical History: Past Medical History:  Diagnosis Date  . Anxiety   . Depression   . Diabetes mellitus without complication ([place])   . 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 GERD (gastroesophageal reflux disease)   . Headache   . Hyperlipidemia   . Hypertension   . Irregular heart beat unk  . Myocardial infarction ([place])   . PTSD (post-traumatic stress disorder)   . Tachycardia     Family History: Family History   Problem Relation Age of Onset  . Hypertension Mother   . Diabetes type II Mother   . Diabetes Mother   . COPD Father   . Cancer Father   . Heart disease Father   . Diabetes Sister   . Anxiety disorder Sister   . Depression Sister   . Diabetes Brother   . Anxiety disorder Brother   . Depression Brother   . Diabetes Maternal Aunt   . Diabetes Sister   . Anxiety disorder Sister   . Depression Sister   . Diabetes Sister   . Anxiety disorder Sister   . Depression Sister   . Diabetes Sister   . Anxiety disorder Sister   . Depression Sister   . Colon cancer Maternal Uncle     Social History   Socioeconomic History  . Marital status: Divorced    Spouse name: Not on file  . Number of children: 4  . Years of education: Not on file  . Highest education level: 8th grade  Occupational History  . Not on file  Social Needs  . Financial resource strain: Very hard  . Food insecurity    Worry: Often true    Inability: Often true  . Transportation needs    Medical: No    Non-medical: No  Tobacco Use  . Smoking status: Current Every Day Smoker    Packs/day: 2.00    Years: 40.00    Pack years: 80.00    Types: Cigarettes  . Smokeless tobacco: Former Systems developer    Types: Chew  . Tobacco comment: Reports has tried multiple things to help quit and cut back.   Substance and Sexual Activity  . Alcohol use: No    Alcohol/week: 0.0 standard drinks    Frequency: Never  . Drug use: No  . Sexual activity: Not Currently  Lifestyle  . Physical activity    Days per week: 0 days    Minutes per session: 0 min  . Stress: Very much  Relationships  . Social Herbalist on phone: Not on file    Gets together: Not on file    Attends religious service: Never    Active member of club or organization: No    Attends meetings of clubs or organizations: Never    Relationship status: Divorced  . Intimate partner violence    Fear of current or ex partner: No    Emotionally abused: No     Physically abused: No    Forced sexual activity: No  Other Topics Concern  . Not on file  Social History Narrative  . Not on file      Review of Systems  Constitutional: Positive for fatigue. Negative for chills and unexpected weight change.  HENT: Negative for congestion, postnasal drip, rhinorrhea, sneezing and sore throat.   Respiratory: Negative  for cough, chest tightness, shortness of breath and wheezing.   Cardiovascular: Negative for chest pain and palpitations.  Gastrointestinal: Positive for abdominal pain, diarrhea and nausea. Negative for constipation and vomiting.  Endocrine: Negative for cold intolerance, heat intolerance, polydipsia and polyuria.       Blood sugars still elevated but continue to improve.  Musculoskeletal: Positive for back pain and myalgias. Negative for arthralgias, joint swelling and neck pain.  Skin: Negative for rash.  Allergic/Immunologic: Positive for environmental allergies.  Neurological: Negative.  Negative for tremors and numbness.  Hematological: Negative for adenopathy. Does not bruise/bleed easily.  Psychiatric/Behavioral: Negative for behavioral problems (Depression), sleep disturbance and suicidal ideas. The patient is nervous/anxious.    Today's Vitals   11/15/18 0826  BP: (!) 155/92  Pulse: 86  Resp: 16  SpO2: 95%  Weight: 246 lb (111.6 kg)  Height: 5\' 9"  (1.753 m)   Body mass index is 36.33 kg/m.   Physical Exam Vitals signs and nursing note reviewed.  Constitutional:      General: He is not in acute distress.    Appearance: He is well-developed. He is obese. He is not ill-appearing or diaphoretic.  HENT:     Head: Normocephalic and atraumatic.     Nose: Nose normal.     Mouth/Throat:     Pharynx: No oropharyngeal exudate.  Eyes:     Conjunctiva/sclera: Conjunctivae normal.     Pupils: Pupils are equal, round, and reactive to light.  Neck:     Musculoskeletal: Normal range of motion and neck supple.     Thyroid: No  thyromegaly.     Vascular: No carotid bruit or JVD.     Trachea: No tracheal deviation.  Cardiovascular:     Rate and Rhythm: Normal rate and regular rhythm.     Pulses: Normal pulses.          Dorsalis pedis pulses are 2+ on the right side and 2+ on the left side.       Posterior tibial pulses are 2+ on the right side and 2+ on the left side.     Heart sounds: Normal heart sounds. No murmur. No friction rub. No gallop.   Pulmonary:     Effort: Pulmonary effort is normal. No respiratory distress.     Breath sounds: Normal breath sounds. No wheezing or rales.  Chest:     Chest wall: No tenderness.  Abdominal:     General: Bowel sounds are normal.     Palpations: Abdomen is soft.     Tenderness: There is abdominal tenderness.     Comments: Moderate, generalized abdominal tenderness. Guarding and grimacing present with light palpation of the upper abdomen. There is also smooth, palpable mass in left flank/lower abdominal area of the abdomen. This is not tender.  Musculoskeletal: Normal range of motion.     Right foot: Normal range of motion. No deformity.     Left foot: Normal range of motion. No deformity.  Feet:     Right foot:     Protective Sensation: 10 sites tested. 10 sites sensed.     Skin integrity: Skin integrity normal.     Toenail Condition: Right toenails are abnormally thick and long.     Left foot:     Protective Sensation: 10 sites tested. 10 sites sensed.     Skin integrity: Skin integrity normal.     Toenail Condition: Left toenails are abnormally thick and long.  Lymphadenopathy:     Cervical: No cervical adenopathy.  Skin:    General: Skin is warm and dry.     Capillary Refill: Capillary refill takes 2 to 3 seconds.  Neurological:     Mental Status: He is alert and oriented to person, place, and time.     Cranial Nerves: No cranial nerve deficit.  Psychiatric:        Mood and Affect: Mood is anxious and depressed.        Speech: Speech normal.         Behavior: Behavior normal.        Thought Content: Thought content normal.        Judgment: Judgment normal.   Assessment/Plan: 1. Generalized abdominal pain Reviewed abdominal ultrasound results with the patient. He has contracted gallbladder which is full of stones. Will fax results to GI provider ASAP for further evaluation and treatment.   2. Calculus of gallbladder with acute on chronic cholecystitis without obstruction Reviewed abdominal ultrasound results with the patient. He has contracted gallbladder which is full of stones. Will fax results to GI provider ASAP for further evaluation and treatment.   3. Uncontrolled type 2 diabetes mellitus with hyperglycemia ([place]) Continue all diabetic medication as prescribed   4. Essential hypertension Generally stable. Continue bp medication as prescribed   General Counseling: Raymond [name] verbalizes understanding of the findings of todays visit and agrees with plan of treatment. I have discussed any further diagnostic evaluation that may be needed or ordered today. We also reviewed his medications today. he has been encouraged to call the office with any questions or concerns that should arise related to todays visit.  This patient was seen by Leretha Pol FNP Collaboration with Dr Lavera Guise as a part of collaborative care agreement  Time spent: 25 Minutes      Dr Lavera Guise Internal medicine

## 2018-11-15 NOTE — Telephone Encounter (Signed)
erroneous

## 2018-11-15 NOTE — Progress Notes (Signed)
PROCEDURE NOTE: The patient presents with symptomatic grade 2 hemorrhoids, unresponsive to maximal medical therapy, requesting rubber band ligation of his/her hemorrhoidal disease.  All risks, benefits and alternative forms of therapy were described and informed consent was obtained.  In the Left Lateral Decubitus position (if anoscopy is performed) anoscopic examination revealed grade 2 hemorrhoids in the all position(s).   The decision was made to band the RP internal hemorrhoid, and the [facility] System was used to perform band ligation without complication.  Digital anorectal examination was then performed to assure proper positioning of the band, and to adjust the banded tissue as required.  The patient was discharged home without pain or other issues.  Dietary and behavioral recommendations were given and (if necessary - prescriptions were given), along with follow-up instructions.  The patient will return 2 weeks for follow-up and possible additional banding as required.  No complications were encountered and the patient tolerated the procedure well.    

## 2018-11-18 ENCOUNTER — Encounter: Payer: Self-pay | Admitting: Psychiatry

## 2018-11-18 ENCOUNTER — Other Ambulatory Visit: Payer: Self-pay

## 2018-11-18 ENCOUNTER — Ambulatory Visit (INDEPENDENT_AMBULATORY_CARE_PROVIDER_SITE_OTHER): Payer: Medicare Other | Admitting: Psychiatry

## 2018-11-18 DIAGNOSIS — F331 Major depressive disorder, recurrent, moderate: Secondary | ICD-10-CM

## 2018-11-18 DIAGNOSIS — E1165 Type 2 diabetes mellitus with hyperglycemia: Secondary | ICD-10-CM

## 2018-11-18 DIAGNOSIS — F5105 Insomnia due to other mental disorder: Secondary | ICD-10-CM

## 2018-11-18 DIAGNOSIS — F4310 Post-traumatic stress disorder, unspecified: Secondary | ICD-10-CM

## 2018-11-18 DIAGNOSIS — I10 Essential (primary) hypertension: Secondary | ICD-10-CM

## 2018-11-18 MED ORDER — TOUJEO MAX SOLOSTAR 300 UNIT/ML ~~LOC~~ SOPN: 60.0000 [IU] | PEN_INJECTOR | Freq: Every day | SUBCUTANEOUS | 5 refills | Status: DC

## 2018-11-18 MED ORDER — TRAZODONE HCL 100 MG PO TABS: 250.0000 mg | ORAL_TABLET | Freq: Every day | ORAL | 2 refills | Status: DC

## 2018-11-18 MED ORDER — MIRTAZAPINE 45 MG PO TABS: 45.0000 mg | ORAL_TABLET | Freq: Every day | ORAL | 2 refills | Status: DC

## 2018-11-18 MED ORDER — METOPROLOL TARTRATE 100 MG PO TABS: 100.0000 mg | ORAL_TABLET | Freq: Two times a day (BID) | ORAL | 0 refills | Status: DC

## 2018-11-18 MED ORDER — QUETIAPINE FUMARATE 50 MG PO TABS: 50.0000 mg | ORAL_TABLET | Freq: Two times a day (BID) | ORAL | 2 refills | Status: DC | PRN

## 2018-11-18 MED ORDER — NOVOLOG FLEXPEN 100 UNIT/ML ~~LOC~~ SOPN: PEN_INJECTOR | SUBCUTANEOUS | 11 refills | Status: DC

## 2018-11-18 NOTE — Progress Notes (Signed)
Virtual Visit via Video Note  I connected with Raymond [name] on 11/18/18 at  3:45 PM EDT by a video enabled telemedicine application and verified that I am speaking with the correct person using two identifiers.   I discussed the limitations of evaluation and management by telemedicine and the availability of in person appointments. The patient expressed understanding and agreed to proceed.   I discussed the assessment and treatment plan with the patient. The patient was provided an opportunity to ask questions and all were answered. The patient agreed with the plan and demonstrated an understanding of the instructions.   The patient was advised to call back or seek an in-person evaluation if the symptoms worsen or if the condition fails to improve as anticipated.   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  Patient reports he had to cancel his previous appointment since his phone was not working that day.  Patient today reports he is currently coping with the loss of his nephew who got killed recently.  Patient reports he is currently recovering from a couple of procedures that he had done.  He had a procedure done for hemorrhoids as well as an ingrown toenail.  He reports he is recovering well.  He however is worried about the fact that he may have an upcoming gallbladder procedure coming up.  Patient reports he has been sleeping well on the current medication regimen.  He reports he is also doing well with regards to his mood.  He wants to stay on the current medications.  He denies any side effects to the medications.  Patient denies  any suicidality, homicidality or perceptual disturbances. Visit Diagnosis:    ICD-10-CM   1. PTSD (post-traumatic stress disorder)  F43.10 QUEtiapine (SEROQUEL) 50 MG tablet  2. MDD (major depressive disorder), recurrent episode, moderate (HCC)  F33.1 QUEtiapine (SEROQUEL) 50 MG tablet  3. Insomnia due to mental disorder  F51.05 traZODone (DESYREL) 100 MG tablet    mirtazapine (REMERON) 45 MG tablet    Past Psychiatric History: I have reviewed past psychiatric history from my progress note on 05/23/2018.  Past trials of Prozac, Zoloft, Celexa, Tegretol, doxepin, Klonopin, Seroquel, mirtazapine, Lunesta, Ambien.  Past Medical History:  Past Medical History:  Diagnosis Date  . Anxiety   . Depression   . Diabetes mellitus without complication ([place])   . 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 GERD (gastroesophageal reflux disease)   . Headache   . Hyperlipidemia   . Hypertension   . Irregular heart beat unk  . Myocardial infarction ([place])   . PTSD (post-traumatic stress disorder)   . Tachycardia     Past Surgical History:  Procedure Laterality Date  .  ingrown toenail removal    . CLAVICLE SURGERY Right   . COLONOSCOPY  2017  . SHOULDER SURGERY Right   . UPPER GI ENDOSCOPY  2017    Family Psychiatric History: I have reviewed family psychiatric history from my progress note on 05/23/2018.  Family History:  Family History  Problem Relation Age of Onset  . Hypertension Mother   . Diabetes type II Mother   . Diabetes Mother   .  COPD Father   . Cancer Father   . Heart disease Father   . Diabetes Sister   . Anxiety disorder Sister   . Depression Sister   . Diabetes Brother   . Anxiety disorder Brother   . Depression Brother   . Diabetes Maternal Aunt   . Diabetes Sister   . Anxiety disorder Sister   . Depression Sister   . Diabetes Sister   . Anxiety disorder Sister   . Depression Sister   . Diabetes Sister   . Anxiety disorder Sister   . Depression Sister   .  Colon cancer Maternal Uncle     Social History: I have reviewed social history from my progress note on 05/23/2018. Social History   Socioeconomic History  . Marital status: Divorced    Spouse name: Not on file  . Number of children: 4  . Years of education: Not on file  . Highest education level: 8th grade  Occupational History  . Not on file  Social Needs  . Financial resource strain: Very hard  . Food insecurity    Worry: Often true    Inability: Often true  . Transportation needs    Medical: No    Non-medical: No  Tobacco Use  . Smoking status: Current Every Day Smoker    Packs/day: 2.00    Years: 40.00    Pack years: 80.00    Types: Cigarettes  . Smokeless tobacco: Former Systems developer    Types: Chew  . Tobacco comment: Reports has tried multiple things to help quit and cut back.   Substance and Sexual Activity  . Alcohol use: No    Alcohol/week: 0.0 standard drinks    Frequency: Never  . Drug use: No  . Sexual activity: Not Currently  Lifestyle  . Physical activity    Days per week: 0 days    Minutes per session: 0 min  . Stress: Very much  Relationships  . Social Herbalist on phone: Not on file    Gets together: Not on file    Attends religious service: Never    Active member of club or organization: No    Attends meetings of clubs or organizations: Never    Relationship status: Divorced  Other Topics Concern  . Not on file  Social History Narrative  . Not on file    Allergies:  Allergies  Allergen Reactions  . Onion Anaphylaxis  . Hydrocodone   . Norco [Hydrocodone-Acetaminophen] Itching  . Hydrocodone-Acetaminophen Itching    Metabolic Disorder Labs: Lab Results  Component Value Date   HGBA1C 9.3 (A) 10/08/2018   MPG 294.83 05/31/2017   No results found for: PROLACTIN Lab Results  Component Value Date   CHOL 134 07/15/2015   TRIG 227 (H) 07/15/2015   HDL 28 (L) 07/15/2015   CHOLHDL 4.8 07/15/2015   VLDL 26 12/09/2012   LDLCALC  61 07/15/2015   LDLCALC 96 12/09/2012   Lab Results  Component Value Date   TSH 4.120 06/10/2018   TSH 2.000 07/15/2015    Therapeutic Level Labs: No results found for: LITHIUM No results found for: VALPROATE No components found for:  CBMZ  Current Medications: Current Outpatient Medications  Medication Sig Dispense Refill  . ACCU-CHEK FASTCLIX LANCETS MISC yse as directed twice a day 100 each 1  . albuterol (PROAIR HFA) 108 (90 Base) MCG/ACT inhaler Inhale 2-4 puffs by mouth Every 4-6 hours as needed for wheezing, cough, and/or shortness of breath 3 Inhaler  1  . aspirin EC 81 MG tablet Take 81 mg by mouth.    . famotidine (PEPCID) 20 MG tablet Take 1 tablet (20 mg total) by mouth 2 (two) times daily. 60 tablet 3  . fluticasone (FLONASE) 50 MCG/ACT nasal spray 2 sprays by Each Nare route daily.    Marland Kitchen gabapentin (NEURONTIN) 100 MG capsule Take 1 capsule (100 mg total) by mouth 3 (three) times daily. 90 capsule 3  . gentamicin cream (GARAMYCIN) 0.1 % Apply 1 application topically 2 (two) times daily. 15 g 1  . glucose blood (ACCU-CHEK AVIVA PLUS) test strip Blood sugar testing TID and as needed. E11.65 100 each 12  . hydrochlorothiazide (HYDRODIURIL) 25 MG tablet Take 25 mg by mouth daily.     . hydrOXYzine (VISTARIL) 50 MG capsule Take 1 capsule (50 mg total) by mouth at bedtime as needed. FOR SLEEP 30 capsule 1  . insulin aspart (NOVOLOG FLEXPEN) 100 UNIT/ML FlexPen Use as directed per sliding scale. Instructions provided. 15 mL 11  . Insulin Glargine, 2 Unit Dial, (TOUJEO MAX SOLOSTAR) 300 UNIT/ML SOPN Inject 60 Units into the skin daily. 3 pen 5  . metoprolol tartrate (LOPRESSOR) 100 MG tablet Take 1 tablet (100 mg total) by mouth 2 (two) times daily. 180 tablet 0  . mirtazapine (REMERON) 45 MG tablet Take 1 tablet (45 mg total) by mouth at bedtime. 30 tablet 2  . nitroGLYCERIN (NITROSTAT) 0.4 MG SL tablet Place 0.4 mg under the tongue every 5 (five) minutes as needed for chest pain.     . pantoprazole (PROTONIX) 40 MG tablet Take 1 tablet (40 mg total) by mouth daily. 30 tablet 5  . QUEtiapine (SEROQUEL) 50 MG tablet Take 1 tablet (50 mg total) by mouth 2 (two) times daily as needed. 60 tablet 2  . sitaGLIPtin-metformin (JANUMET) 50-500 MG tablet Take 1 tablet by mouth 2 (two) times daily. 180 tablet 1  . traZODone (DESYREL) 100 MG tablet Take 2.5 tablets (250 mg total) by mouth at bedtime. 75 tablet 2   No current facility-administered medications for this visit.      Musculoskeletal: Strength & Muscle Tone: UTA Gait & Station: normal Patient leans: N/A  Psychiatric Specialty Exam: Review of Systems  Psychiatric/Behavioral: The patient is nervous/anxious.   All other systems reviewed and are negative.   There were no vitals taken for this visit.There is no height or weight on file to calculate BMI.  General Appearance: Casual  Eye Contact:  Fair  Speech:  Clear and Coherent  Volume:  Normal  Mood:  Anxious  Affect:  Appropriate  Thought Process:  Goal Directed and Descriptions of Associations: Intact  Orientation:  Full (Time, Place, and Person)  Thought Content: Logical   Suicidal Thoughts:  No  Homicidal Thoughts:  No  Memory:  Immediate;   Fair Recent;   Fair Remote;   Fair  Judgement:  Fair  Insight:  Fair  Psychomotor Activity:  Normal  Concentration:  Concentration: Fair and Attention Span: Fair  Recall:  [employer] of Knowledge: Fair  Language: Fair  Akathisia:  No  Handed:  Right  AIMS (if indicated): Denies tremors, rigidity  Assets:  Communication Skills Desire for Improvement Social Support  ADL's:  Intact  Cognition: WNL  Sleep:  Fair   Screenings: Mini-Mental     Clinical Support from 10/08/2018 in [facility], [place] from 10/05/2017 in [facility], [facility]  Total Score (max 30 points )  27  30  PHQ2-9     Office Visit from 11/15/2018 in [facility] [place] from  10/08/2018 in 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  Patient is biologically predisposed given his multiple medical problems, history of trauma.  Patient also has psychosocial stressors of the COVID-19 outbreak, his own health issues and death of his nephew.  Patient is currently doing well on the current medications.  Plan MDD-improving Mirtazapine 45 mg p.o. nightly Seroquel 50 mg p.o. twice daily PRN for mood lability. Patient to continue psychotherapy sessions with Ms. Cecilie Lowers.  For PTSD-improving Continue CBT.  For insomnia-improving Trazodone 250 mg p.o. nightly. Continue CPAP for OSA.  Follow-up in clinic in 2 months or sooner if needed.  September 28 at 2 PM  I have spent atleast 15 minutes non face to face with patient today. More than 50 % of the time was spent for psychoeducation and supportive psychotherapy and care coordination.  This note was generated in part or whole with voice recognition software. Voice recognition is usually quite accurate but there are transcription errors that can and very often do occur. I apologize for any typographical errors that were not detected and corrected.          Ursula Alert, MD 11/18/2018, 6:08 PM

## 2018-11-18 NOTE — Progress Notes (Signed)
Can you make sure his GI doctor got a copy of this ultrasound? Thanks

## 2018-11-19 ENCOUNTER — Telehealth: Payer: Self-pay | Admitting: Nurse Practitioner

## 2018-11-19 NOTE — Telephone Encounter (Signed)
Insurance does not cover novolog, preferred medication is humalog.

## 2018-11-19 NOTE — Telephone Encounter (Signed)
Can you please call the pharamcy and give them ok to change this. Thanks.

## 2018-11-20 ENCOUNTER — Other Ambulatory Visit: Payer: Self-pay | Admitting: Nurse Practitioner

## 2018-11-20 MED ORDER — INSULIN LISPRO (1 UNIT DIAL) 100 UNIT/ML (KWIKPEN): PEN_INJECTOR | SUBCUTANEOUS | 2 refills | Status: DC

## 2018-11-28 ENCOUNTER — Telehealth: Payer: Self-pay | Admitting: Gastroenterology

## 2018-11-28 NOTE — Telephone Encounter (Signed)
Ultrasound report shows mild hepatomegaly.  Increased echogenicity, likely fatty infiltration.  Borderline splenomegaly.  No etiology of his abdominal pain seen on ultrasound.  We will order CT abdomen pelvis for evaluation of abdominal pain

## 2018-12-03 ENCOUNTER — Other Ambulatory Visit: Payer: Self-pay | Admitting: Nurse Practitioner

## 2018-12-03 MED ORDER — INSULIN LISPRO (1 UNIT DIAL) 100 UNIT/ML (KWIKPEN): PEN_INJECTOR | SUBCUTANEOUS | 2 refills | Status: DC

## 2018-12-04 ENCOUNTER — Ambulatory Visit: Payer: Medicare Other | Admitting: Gastroenterology

## 2018-12-10 ENCOUNTER — Other Ambulatory Visit: Payer: Self-pay

## 2018-12-10 ENCOUNTER — Ambulatory Visit: Payer: Medicare Other | Admitting: Gastroenterology

## 2018-12-10 ENCOUNTER — Encounter: Payer: Self-pay | Admitting: Gastroenterology

## 2018-12-10 VITALS — BP 135/88 | HR 91 | Temp 97.7°F | Resp 17 | Ht 69.0 in | Wt 246.2 lb

## 2018-12-10 DIAGNOSIS — R109 Unspecified abdominal pain: Secondary | ICD-10-CM

## 2018-12-10 NOTE — Progress Notes (Signed)

## 2018-12-13 ENCOUNTER — Ambulatory Visit
Admission: RE | Admit: 2018-12-13 | Discharge: 2018-12-13 | Disposition: A | Discharge status: Home / Self Care | Payer: Medicare Other | Source: Ambulatory Visit | Attending: Gastroenterology | Admitting: Gastroenterology

## 2018-12-13 ENCOUNTER — Other Ambulatory Visit: Payer: Self-pay

## 2018-12-13 DIAGNOSIS — R109 Unspecified abdominal pain: Secondary | ICD-10-CM | POA: Diagnosis not present

## 2018-12-13 DIAGNOSIS — R197 Diarrhea, unspecified: Secondary | ICD-10-CM | POA: Diagnosis not present

## 2018-12-13 DIAGNOSIS — K4020 Bilateral inguinal hernia, without obstruction or gangrene, not specified as recurrent: Secondary | ICD-10-CM | POA: Diagnosis not present

## 2018-12-13 MED ORDER — IOHEXOL 300 MG/ML  SOLN
100.0000 mL | Freq: Once | INTRAMUSCULAR | Status: AC | PRN
  Administered 2018-12-13: 100 mL via INTRAVENOUS

## 2018-12-16 ENCOUNTER — Telehealth: Payer: Self-pay | Admitting: Gastroenterology

## 2018-12-16 ENCOUNTER — Other Ambulatory Visit: Payer: Self-pay

## 2018-12-16 DIAGNOSIS — K4020 Bilateral inguinal hernia, without obstruction or gangrene, not specified as recurrent: Secondary | ICD-10-CM

## 2018-12-16 NOTE — Telephone Encounter (Signed)
Pt would like a call from [place] to explain why he is having a scan and why he is needing to schedule a 3 month f/u please call pt

## 2018-12-20 ENCOUNTER — Other Ambulatory Visit: Payer: Self-pay

## 2018-12-20 ENCOUNTER — Ambulatory Visit: Payer: Medicare Other | Admitting: Licensed Clinical Social Worker

## 2018-12-20 ENCOUNTER — Encounter: Payer: Self-pay | Admitting: Licensed Clinical Social Worker

## 2018-12-20 ENCOUNTER — Ambulatory Visit (INDEPENDENT_AMBULATORY_CARE_PROVIDER_SITE_OTHER): Payer: Medicare Other | Admitting: Licensed Clinical Social Worker

## 2018-12-20 DIAGNOSIS — F4310 Post-traumatic stress disorder, unspecified: Secondary | ICD-10-CM

## 2018-12-20 NOTE — Progress Notes (Signed)
Virtual Visit via Video Note  I connected with Raymond [name] on 12/20/18 at  9:00 AM EDT by a video enabled telemedicine application and verified that I am speaking with the correct person using two identifiers.   I discussed the limitations of evaluation and management by telemedicine and the availability of in person appointments. The patient expressed understanding and agreed to proceed.    I discussed the assessment and treatment plan with the patient. The patient was provided an opportunity to ask questions and all were answered. The patient agreed with the plan and demonstrated an understanding of the instructions.   The patient was advised to call back or seek an in-person evaluation if the symptoms worsen or if the condition fails to improve as anticipated.  I provided 30 minutes of non-face-to-face time during this encounter.   Alden Hipp, LCSW    THERAPIST PROGRESS NOTE  Session Time: 0900  Participation Level: Active  Behavioral Response: CasualAlertNA  Type of Therapy: Individual Therapy  Treatment Goals addressed: Coping  Interventions: Supportive  Summary: Raymond [name] is a 53 y.o. male who presents with continued symptoms related to his diagnosis. Add reports doing well since our last session. He reports continued adherence with medications and reports his mood has felt stable. He reports several medical problems he's experienced over the last month. He reports having a bulging disc in his back and two hernias that he'll have to have surgically repaired. Breyson reported feeling anxious about the procedures, but stated he was trying to not "jumpt to the worst case scenario." LCSW validated Raymond [name]'s anxious feelings around upcoming medical procedures, and encouraged him to challenge negative thoughts that cause him to catastrophize. Kru expressed understanding and agreement, and reported he has been working to challenge his thoughts around the medical  problems. We quickly reviewed CBT skills and how they can be utilized to assist when Raymond [name] begins challenging anxious thoughts. Raymond [name] expressed understanding. Raymond [name] went on to discuss his sister, and how she continues to have a difficult time around her son's death. We discussed grief and how it is not a linear process, and how at times we may not understand someone else's grieving process. Raymond [name] was able to hear this and expressed agreement. He reported feeling his sister is often looking for attention. LCSW validated Alben's feelings, but encouraged him to be patient with his sister. Raymond [name] expressed understanding. Raymond [name] reported picking up a new hobby, "me and my brother have been going around metal detecting." Raymond [name] expressed that his new interest has kept him busy and kept his mind busy as well. LCSW validated this as a coping mechanism and encouraged Raymond [name] to continue going on these outings with his brother.   Suicidal/Homicidal: No  Therapist Response: Raymond [name] continues to work towards his tx goals but has not yet reached them. Raymond [name] will continue to work on improving emotional regulation skills and challenging negative thoughts.   Plan: Return again in 6 weeks.  Diagnosis: Axis I: Post Traumatic Stress Disorder    Axis II: No diagnosis    Alden Hipp, LCSW 12/20/2018

## 2018-12-23 NOTE — Telephone Encounter (Signed)
Pt had CT scan on 12/13/18.

## 2019-01-03 ENCOUNTER — Other Ambulatory Visit: Payer: Self-pay

## 2019-01-03 ENCOUNTER — Ambulatory Visit (INDEPENDENT_AMBULATORY_CARE_PROVIDER_SITE_OTHER): Payer: Medicare Other | Admitting: Surgery

## 2019-01-03 ENCOUNTER — Encounter: Payer: Self-pay | Admitting: Surgery

## 2019-01-03 DIAGNOSIS — R1011 Right upper quadrant pain: Secondary | ICD-10-CM | POA: Diagnosis not present

## 2019-01-03 NOTE — Patient Instructions (Addendum)
We will schedule you a HIDA Scan which will determine how your gallbladder is working.  This is scheduled at [facility]. You will enter in through the [place] and report to the Registration desk. Appt: September 22nd at 9:00 am, please arrive by 8:45 am. You will have nothing to eat or drink after midnight the night prior.  We will see you in office afterwards if the study is abnormal.  September 25th at 10:15 am with Dr Hampton Abbot   Cholelithiasis  Cholelithiasis is a form of gallbladder disease in which gallstones form in the gallbladder. The gallbladder is an organ that stores bile. Bile is made in the liver, and it helps to digest fats. Gallstones begin as small crystals and slowly grow into stones. They may cause no symptoms until the gallbladder tightens (contracts) and a gallstone is blocking the duct (gallbladder attack), which can cause pain. Cholelithiasis is also referred to as gallstones. There are two main types of gallstones:  Cholesterol stones. These are made of hardened cholesterol and are usually yellow-green in color. They are the most common type of gallstone. Cholesterol is a white, waxy, fat-like substance that is made in the liver.  Pigment stones. These are dark in color and are made of a red-yellow substance that forms when hemoglobin from red blood cells breaks down (bilirubin). What are the causes? This condition may be caused by an imbalance in the substances that bile is made of. This can happen if the bile:  Has too much bilirubin.  Has too much cholesterol.  Does not have enough bile salts. These salts help the body absorb and digest fats. In some cases, this condition can also be caused by the gallbladder not emptying completely or often enough. What increases the risk? The following factors may make you more likely to develop this condition:  Being male.  Having multiple pregnancies. Health care providers sometimes advise removing diseased gallbladders  before future pregnancies.  Eating a diet that is heavy in fried foods, fat, and refined carbohydrates, like white bread and white rice.  Being obese.  Being older than age 62.  Prolonged use of medicines that contain male hormones (estrogen).  Having diabetes mellitus.  Rapidly losing weight.  Having a family history of gallstones.  Being of [place] or [place] descent.  Having an intestinal disease such as Crohn disease.  Having metabolic syndrome.  Having cirrhosis.  Having severe types of anemia such as sickle cell anemia. What are the signs or symptoms? In most cases, there are no symptoms. These are known as silent gallstones. If a gallstone blocks the bile ducts, it can cause a gallbladder attack. The main symptom of a gallbladder attack is sudden pain in the upper right abdomen. The pain usually comes at night or after eating a large meal. The pain can last for one or several hours and can spread to the right shoulder or chest. If the bile duct is blocked for more than a few hours, it can cause infection or inflammation of the gallbladder, liver, or pancreas, which may cause:  Nausea.  Vomiting.  Abdominal pain that lasts for 5 hours or more.  Fever or chills.  Yellowing of the skin or the whites of the eyes (jaundice).  Dark urine.  Light-colored stools. How is this diagnosed? This condition may be diagnosed based on:  A physical exam.  Your medical history.  An ultrasound of your gallbladder.  CT scan.  MRI.  Blood tests to check for signs of  infection or inflammation.  A scan of your gallbladder and bile ducts (biliary system) using nonharmful radioactive material and special cameras that can see the radioactive material (cholescintigram). This test checks to see how your gallbladder contracts and whether bile ducts are blocked.  Inserting a small tube with a camera on the end (endoscope) through your mouth to inspect bile ducts and check  for blockages (endoscopic retrograde cholangiopancreatogram). How is this treated? Treatment for gallstones depends on the severity of the condition. Silent gallstones do not need treatment. If the gallstones cause a gallbladder attack or other symptoms, treatment may be required. Options for treatment include:  Surgery to remove the gallbladder (cholecystectomy). This is the most common treatment.  Medicines to dissolve gallstones. These are most effective at treating small gallstones. You may need to take medicines for up to 6-12 months.  Shock wave treatment (extracorporeal biliary lithotripsy). In this treatment, an ultrasound machine sends shock waves to the gallbladder to break gallstones into smaller pieces. These pieces can then be passed into the intestines or be dissolved by medicine. This is rarely used.  Removing gallstones through endoscopic retrograde cholangiopancreatogram. A small basket can be attached to the endoscope and used to capture and remove gallstones. Follow these instructions at home:  Take over-the-counter and prescription medicines only as told by your health care provider.  Maintain a healthy weight and follow a healthy diet. This includes: ? Reducing fatty foods, such as fried food. ? Reducing refined carbohydrates, like white bread and white rice. ? Increasing fiber. Aim for foods like almonds, fruit, and beans.  Keep all follow-up visits as told by your health care provider. This is important. Contact a health care provider if:  You think you have had a gallbladder attack.  You have been diagnosed with silent gallstones and you develop abdominal pain or indigestion. Get help right away if:  You have pain from a gallbladder attack that lasts for more than 2 hours.  You have abdominal pain that lasts for more than 5 hours.  You have a fever or chills.  You have persistent nausea and vomiting.  You develop jaundice.  You have dark urine or  light-colored stools. Summary  Cholelithiasis (also called gallstones) is a form of gallbladder disease in which gallstones form in the gallbladder.  This condition is caused by an imbalance in the substances that make up bile. This can happen if the bile has too much cholesterol, too much bilirubin, or not enough bile salts.  You are more likely to develop this condition if you are male, pregnant, using medicines with estrogen, obese, older than age 42, or have a family history of gallstones. You may also develop gallstones if you have diabetes, an intestinal disease, cirrhosis, or metabolic syndrome.  Treatment for gallstones depends on the severity of the condition. Silent gallstones do not need treatment.  If gallstones cause a gallbladder attack or other symptoms, treatment may be needed. The most common treatment is surgery to remove the gallbladder. This information is not intended to replace advice given to you by your health care provider. Make sure you discuss any questions you have with your health care provider. Document Released: 04/06/2005 Document Revised: 03/23/2017 Document Reviewed: 12/26/2015 Elsevier Patient Education  2020 [employer].

## 2019-01-03 NOTE — Progress Notes (Signed)
01/03/2019  History of Present Illness: Raymond [name] is a 53 y.o. male presents today for bilateral inguinal hernias.  I had seen the patient back on 06/19/2018 for concerns of an abdominal wall hernia in the upper abdomen which on exam was diagnosed as diastasis recti.  He has been having loose stools and upper abdominal pain, that he reports is mostly on the upper abdomen and right upper abdomen.  He does not know if food makes the pain worse, but he does feel that food runs through him and he has loose stools soon after eating and sometimes while he's eating.  He has had two ultrasounds on 2/27 and 7/17 which showed a contracted or decompressed gallbladder with cholelithiasis.  He had a CT scan on 8/21 which showed bilateral inguinal hernias.  However, he also had a CT scan in March 2019 and I have independently compared the images, and they look exactly the same as the previous CT scan, without any evidence of inguinal hernias.  He also denies any lower abdominal pain at the groin areas.  He also reports having a "knot" in the left lower quadrant abdominal wall.  He mentions he had antibiotics for it at one point when the pain was more severe, and it helped.  Past Medical History: Past Medical History:  Diagnosis Date  . Anxiety   . Depression   . Diabetes mellitus without complication ([place])   . 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 GERD (gastroesophageal reflux disease)   . Headache   . Hyperlipidemia   . Hypertension   . Irregular heart beat unk  . Myocardial infarction ([place])   . PTSD (post-traumatic stress disorder)   . Tachycardia      Past Surgical History: Past Surgical History:  Procedure Laterality Date  .  ingrown toenail removal    . CLAVICLE SURGERY Right   . COLONOSCOPY  2017  . SHOULDER SURGERY Right   . UPPER GI ENDOSCOPY  2017    Home Medications: Prior to Admission medications   Medication Sig Start Date End Date Taking? Authorizing Provider   ACCU-CHEK FASTCLIX LANCETS MISC yse as directed twice a day 05/07/17  Yes Boscia, Greer Ee, NP  albuterol (PROAIR HFA) 108 (90 Base) MCG/ACT inhaler Inhale 2-4 puffs by mouth Every 4-6 hours as needed for wheezing, cough, and/or shortness of breath 07/31/18  Yes Ronnell Freshwater, NP  aspirin EC 81 MG tablet Take 81 mg by mouth. 01/12/16  Yes [provider]  famotidine (PEPCID) 20 MG tablet Take 1 tablet (20 mg total) by mouth 2 (two) times daily. 10/03/18  Yes Boscia, Heather E, NP  fluticasone (FLONASE) 50 MCG/ACT nasal spray 2 sprays by Each Nare route daily. 05/16/16  Yes [provider]  gabapentin (NEURONTIN) 100 MG capsule Take 1 capsule (100 mg total) by mouth 3 (three) times daily. 11/12/18  Yes Edrick Kins, DPM  gentamicin cream (GARAMYCIN) 0.1 % Apply 1 application topically 2 (two) times daily. 10/29/18  Yes Edrick Kins, DPM  glucose blood (ACCU-CHEK AVIVA PLUS) test strip Blood sugar testing TID and as needed. E11.65 10/05/17  Yes Boscia, Greer Ee, NP  hydrochlorothiazide (HYDRODIURIL) 25 MG tablet Take 25 mg by mouth daily.    Yes [provider]  hydrOXYzine (VISTARIL) 50 MG capsule Take 1 capsule (50 mg total) by mouth at bedtime as needed. FOR SLEEP 07/31/18  Yes Eappen, Ria Clock, MD  Insulin Glargine, 2 Unit Dial, (TOUJEO MAX SOLOSTAR) 300 UNIT/ML SOPN Inject  60 Units into the skin daily. 11/18/18  Yes Ronnell Freshwater, NP  insulin lispro (HUMALOG KWIKPEN) 100 UNIT/ML KwikPen Use as directed per  sliding scale max units 8-10 per meal 12/03/18  Yes Boscia, Heather E, NP  metoprolol tartrate (LOPRESSOR) 100 MG tablet Take 1 tablet (100 mg total) by mouth 2 (two) times daily. 11/18/18  Yes Boscia, Greer Ee, NP  mirtazapine (REMERON) 45 MG tablet Take 1 tablet (45 mg total) by mouth at bedtime. 11/18/18  Yes Ursula Alert, MD  nitroGLYCERIN (NITROSTAT) 0.4 MG SL tablet Place 0.4 mg under the tongue every 5 (five) minutes as needed for chest pain.   Yes [provider]  pantoprazole (PROTONIX) 40 MG tablet Take 1 tablet (40 mg total) by mouth daily. 09/13/18  Yes Boscia, Greer Ee, NP  QUEtiapine (SEROQUEL) 50 MG tablet Take 1 tablet (50 mg total) by mouth 2 (two) times daily as needed. 11/18/18  Yes Ursula Alert, MD  sitaGLIPtin-metformin (JANUMET) 50-500 MG tablet Take 1 tablet by mouth 2 (two) times daily. 04/01/18  Yes Ronnell Freshwater, NP  traZODone (DESYREL) 100 MG tablet Take 2.5 tablets (250 mg total) by mouth at bedtime. 11/18/18  Yes Ursula Alert, MD    Allergies: Allergies  Allergen Reactions  . Onion Anaphylaxis  . Hydrocodone   . Norco [Hydrocodone-Acetaminophen] Itching  . Hydrocodone-Acetaminophen Itching    Review of Systems: Review of Systems  Constitutional: Negative for chills and fever.  Respiratory: Negative for shortness of breath.   Cardiovascular: Negative for chest pain.  Gastrointestinal: Positive for abdominal pain and diarrhea. Negative for constipation, nausea and vomiting.  Genitourinary: Negative for dysuria.  Musculoskeletal: Positive for back pain.  Skin: Negative for rash.    Physical Exam BP (!) 168/104   Pulse 92   Temp 98.1 F (36.7 C)   Ht 5\' 9"  (1.753 m)   Wt 246 lb (111.6 kg)   SpO2 96%   BMI 36.33 kg/m  CONSTITUTIONAL: No acute distress HEENT:  Normocephalic, atraumatic, extraocular motion intact. RESPIRATORY:  Lungs are clear, and breath sounds are equal bilaterally. Normal respiratory effort without pathologic use of accessory muscles. CARDIOVASCULAR: Heart is regular without murmurs, gallops, or rubs. GI: The abdomen is soft, obese, non-distended.  He does have tenderness to palpation in the epigastric area and right upper quadrant.  Negative Murphy's sign.  On exam, there is no palpable inguinal hernia on either side.  There is no palpable umbilical hernia either. I am unable to palpate any mass in the left lower abdominal wall where patient also has pain. NEUROLOGIC:  Motor  and sensation is grossly normal.  Cranial nerves are grossly intact. PSYCH:  Alert and oriented to person, place and time. Affect is normal.  Labs/Imaging: CT scan abdomen/pelvis 12/13/18: IMPRESSION: Small BILATERAL inguinal hernias containing fat. Fatty infiltration of liver. No acute intra-abdominal or intrapelvic abnormalities. Stable 3 mm LEFT lower lobe nodule. Small chronic LEFT renal cyst.  U/S 11/08/18: Impression: Mild hepatomegaly.  Increased echogenicity, likely fatty infiltration. Borderline splenomegaly. Contracted gallbladder filled with stones.  Wall thickness upper normal. 3.2 cm left renal cyst  U/S 06/20/18: IMPRESSION: Fatty liver. Decompressed gallbladder with a gallstone within.   Assessment and Plan: This is a 53 y.o. male presenting for bilateral inguinal hernias and abdominal pain.  Discussed with the patient that on exam and from his CT scans, I do not think he has bilateral inguinal hernias.  If he does, they are so small that they are of no clinical  consequence.    I am more concerned about his abdominal pain in the upper abdomen and RUQ.  He does have decompressed gallbladder on two ultrasounds with stones, so there could be a component of chronic cholecystitis.  I will order a HIDA scan to also evaluate for potential dyskinesia that could be contributing to his loose stools, bloatedness, and pain.    Recommended he maintain a low fat diet.  He will follow up after HIDA scan done to review results and see if he needs any cholecystectomy.  Face-to-face time spent with the patient and care providers was 25 minutes, with more than 50% of the time spent counseling, educating, and coordinating care of the patient.     Melvyn Neth, [facility]

## 2019-01-07 ENCOUNTER — Ambulatory Visit (INDEPENDENT_AMBULATORY_CARE_PROVIDER_SITE_OTHER): Payer: Medicare Other | Admitting: Gastroenterology

## 2019-01-07 ENCOUNTER — Other Ambulatory Visit: Payer: Self-pay

## 2019-01-07 ENCOUNTER — Encounter: Payer: Self-pay | Admitting: Gastroenterology

## 2019-01-07 VITALS — BP 136/88 | HR 83 | Temp 98.3°F | Ht 69.0 in | Wt 243.8 lb

## 2019-01-07 DIAGNOSIS — K529 Noninfective gastroenteritis and colitis, unspecified: Secondary | ICD-10-CM | POA: Diagnosis not present

## 2019-01-07 DIAGNOSIS — R140 Abdominal distension (gaseous): Secondary | ICD-10-CM

## 2019-01-07 DIAGNOSIS — R109 Unspecified abdominal pain: Secondary | ICD-10-CM

## 2019-01-07 DIAGNOSIS — K641 Second degree hemorrhoids: Secondary | ICD-10-CM

## 2019-01-07 MED ORDER — DICYCLOMINE HCL 10 MG PO CAPS: 10.0000 mg | ORAL_CAPSULE | Freq: Three times a day (TID) | ORAL | 0 refills | Status: DC

## 2019-01-07 NOTE — Progress Notes (Signed)
PROCEDURE NOTE: The patient presents with symptomatic grade 2 hemorrhoids, unresponsive to maximal medical therapy, requesting rubber band ligation of his/her hemorrhoidal disease.  All risks, benefits and alternative forms of therapy were described and informed consent was obtained.   The decision was made to band the LL internal hemorrhoid, and the [place] was used to perform band ligation without complication.  Digital anorectal examination was then performed to assure proper positioning of the band, and to adjust the banded tissue as required.  The patient was discharged home without pain or other issues.  Dietary and behavioral recommendations were given and (if necessary - prescriptions were given), along with follow-up instructions.  The patient will return for follow-up as needed and possible additional banding as required.  No complications were encountered and the patient tolerated the procedure well.

## 2019-01-07 NOTE — Progress Notes (Signed)
Cephas Darby, MD [address]  [place]  [place], [place] [address]  Main: [phone]  Fax: [phone] Pager: [phone]   Primary Care Physician: Ronnell Freshwater, NP  Primary Gastroenterologist:  Dr. Bonna Gains  Chief Complaint  Patient presents with  . Hemorrhoids    banding  . Diarrhea    HPI: Raymond [name] is a 53 y.o. male seen in consultation for symptomatic hemorrhoids.  His symptoms include intermittent rectal bleeding, rectal pain, discomfort, burning, itching, pressure/swelling.  He does report spending 10 to 20 minutes time in the toilet bowel due to sensation of incomplete emptying.  He does report loose stools.  He consumes red meat regularly, lately using air Rolly Salter to cut back on greasy foods.  He thinks he does not have enough fiber in his diet.  He was told that he has cholelithiasis based on the ultrasound last week and he will follow-up with Dr. Bonna Gains  Follow-up visit 01/07/2019 Patient reports that his rectal bleeding almost subsided.  However, he continues to have abdominal bloating, cramps, severe postprandial urgency which is a chronic issue.  He is here to undergo third hemorrhoid ligation today  Current Outpatient Medications  Medication Sig Dispense Refill  . ACCU-CHEK FASTCLIX LANCETS MISC yse as directed twice a day 100 each 1  . albuterol (PROAIR HFA) 108 (90 Base) MCG/ACT inhaler Inhale 2-4 puffs by mouth Every 4-6 hours as needed for wheezing, cough, and/or shortness of breath 3 Inhaler 1  . aspirin EC 81 MG tablet Take 81 mg by mouth.    . famotidine (PEPCID) 20 MG tablet Take 1 tablet (20 mg total) by mouth 2 (two) times daily. 60 tablet 3  . fluticasone (FLONASE) 50 MCG/ACT nasal spray 2 sprays by Each Nare route daily.    Marland Kitchen gabapentin (NEURONTIN) 100 MG capsule Take 1 capsule (100 mg total) by mouth 3 (three) times daily. 90 capsule 3  . glucose blood (ACCU-CHEK AVIVA PLUS) test strip Blood sugar testing TID and as needed.  E11.65 100 each 12  . hydrochlorothiazide (HYDRODIURIL) 25 MG tablet Take 25 mg by mouth daily.     . hydrOXYzine (VISTARIL) 50 MG capsule Take 1 capsule (50 mg total) by mouth at bedtime as needed. FOR SLEEP 30 capsule 1  . Insulin Glargine, 2 Unit Dial, (TOUJEO MAX SOLOSTAR) 300 UNIT/ML SOPN Inject 60 Units into the skin daily. 3 pen 5  . insulin lispro (HUMALOG KWIKPEN) 100 UNIT/ML KwikPen Use as directed per  sliding scale max units 8-10 per meal 15 mL 2  . metoprolol tartrate (LOPRESSOR) 100 MG tablet Take 1 tablet (100 mg total) by mouth 2 (two) times daily. 180 tablet 0  . mirtazapine (REMERON) 45 MG tablet Take 1 tablet (45 mg total) by mouth at bedtime. 30 tablet 2  . nitroGLYCERIN (NITROSTAT) 0.4 MG SL tablet Place 0.4 mg under the tongue every 5 (five) minutes as needed for chest pain.    . pantoprazole (PROTONIX) 40 MG tablet Take 1 tablet (40 mg total) by mouth daily. 30 tablet 5  . QUEtiapine (SEROQUEL) 50 MG tablet Take 1 tablet (50 mg total) by mouth 2 (two) times daily as needed. 60 tablet 2  . sitaGLIPtin-metformin (JANUMET) 50-500 MG tablet Take 1 tablet by mouth 2 (two) times daily. 180 tablet 1  . traZODone (DESYREL) 100 MG tablet Take 2.5 tablets (250 mg total) by mouth at bedtime. 75 tablet 2  . dicyclomine (BENTYL) 10 MG capsule Take 1 capsule (10 mg  total) by mouth 4 (four) times daily -  before meals and at bedtime for 30 doses. 30 capsule 0  . gentamicin cream (GARAMYCIN) 0.1 % Apply 1 application topically 2 (two) times daily. (Patient not taking: Reported on 01/07/2019) 15 g 1   No current facility-administered medications for this visit.     Allergies as of 01/07/2019 - Review Complete 01/07/2019  Allergen Reaction Noted  . Onion Anaphylaxis 03/30/2015  . Hydrocodone  01/13/2014  . Norco [hydrocodone-acetaminophen] Itching 11/02/2014  . Hydrocodone-acetaminophen Itching 11/02/2014    NSAIDs: None  Antiplts/Anticoagulants/Anti thrombotics: None  GI  procedures: EGD and colonoscopy at [facility] in 05/2015 A: Small bowel, duodenal bulb, biopsy  - Foveolar metaplasia with acute hemorrhage into lamina propria, consistent with procedure artifact - No duodenitis or dysplasia identified  B: Esophagus, biopsy - Squamocolumnar mucosa with mild reflux associated changes - No intestinal metaplasia, dysplasia or gastritis identified  C: Colon, transverse, biopsy  - Hyperplastic polyp (1 fragment)  D: Colon, rectosigmoid, biopsy  - Hyperplastic polyp (multiple fragments) with focal secondary inflammatory changes   ROS:  General: Negative for anorexia, weight loss, fever, chills, fatigue, weakness. ENT: Negative for hoarseness, difficulty swallowing , nasal congestion. CV: Negative for chest pain, angina, palpitations, dyspnea on exertion, peripheral edema.  Respiratory: Negative for dyspnea at rest, dyspnea on exertion, cough, sputum, wheezing.  GI: See history of present illness. GU:  Negative for dysuria, hematuria, urinary incontinence, urinary frequency, nocturnal urination.  Endo: Negative for unusual weight change.    Physical Examination:   BP 136/88   Pulse 83   Temp 98.3 F (36.8 C) (Oral)   Ht 5\' 9"  (1.753 m)   Wt 243 lb 12.8 oz (110.6 kg)   BMI 36.00 kg/m   General: Well-nourished, well-developed in no acute distress.  Eyes: No icterus. Conjunctivae pink. Mouth: Oropharyngeal mucosa moist and pink , no lesions erythema or exudate. Lungs: Clear to auscultation bilaterally. Non-labored. Heart: Regular rate and rhythm, no murmurs rubs or gallops.  Abdomen: Bowel sounds are normal, obese, nontender, nondistended, no hepatosplenomegaly or masses, no hernia , no rebound or guarding.   Rectal: Digital rectal exam, mild rectal discomfort Extremities: No lower extremity edema. No clubbing or deformities. Neuro: Alert and oriented x 3.  Grossly intact. Skin: Warm and dry, no jaundice.   Psych: Alert and cooperative, normal mood and  affect.   Imaging Studies: Ct Abdomen Pelvis W Contrast  Result Date: 12/13/2018 CLINICAL DATA:  Abdominal pain with diarrhea for several weeks, pain and nausea after eating, history diabetes mellitus, hypertension, prior MI, GERD EXAM: CT ABDOMEN AND PELVIS WITH CONTRAST TECHNIQUE: Multidetector CT imaging of the abdomen and pelvis was performed using the standard protocol following bolus administration of intravenous contrast. Sagittal and coronal MPR images reconstructed from axial data set. CONTRAST:  197mL OMNIPAQUE IOHEXOL 300 MG/ML SOLN IV. Dilute oral contrast. COMPARISON:  07/21/2017 FINDINGS: Lower chest: 3 mm LEFT lower lobe nodule image 11 unchanged. Remaining lung bases clear. Hepatobiliary: Fatty infiltration of liver. Contracted gallbladder. No focal hepatic abnormality or biliary dilatation. Pancreas: Normal appearance Spleen: Normal appearance Adrenals/Urinary Tract: Adrenal glands normal appearance. Small LEFT renal cyst again seen. Kidneys, ureters, and bladder otherwise normal appearance. Stomach/Bowel: Normal appendix. Stomach and bowel loops normal appearance. Air within distal esophagus, nonspecific. Vascular/Lymphatic: Atherosclerotic calcifications aorta and iliac arteries. Aorta normal caliber. No adenopathy. Reproductive: Unremarkable seminal vesicles and prostate gland. Other: Small BILATERAL inguinal hernias containing fat. No free air or free fluid. No acute inflammatory process. Musculoskeletal:  Mildly bulging lumbar discs. No acute osseous findings. IMPRESSION: Small BILATERAL inguinal hernias containing fat. Fatty infiltration of liver. No acute intra-abdominal or intrapelvic abnormalities. Stable 3 mm LEFT lower lobe nodule. Small chronic LEFT renal cyst. Electronically Signed   By: Lavonia Dana M.D.   On: 12/13/2018 14:47    Assessment and Plan:   Raymond [name] is a 53 y.o. male with metabolic syndrome, fatty liver seen for follow-up of symptomatic internal  hemorrhoids.  Patient has grade II hemorrhoids.  He does not have anal fissure.  He failed medical therapy for treatment of hemorrhoids.    Grade 2 hemorrhoids Perform hemorrhoid ligation today  Chronic nonbloody diarrhea associated with abdominal bloating and cramps: EPI or less likely IBD or SIBO or IBS-D Given history of chronic tobacco use since age of 41, recommend to rule out exocrine pancreatic insufficiency.  Patient had a colonoscopy 2017 which revealed normal mucosa, although random colon biopsies were not performed Will check pancreatic fecal elastase levels, fecal calprotectin levels Trial of dicyclomine 10 mg before each meal and at bedtime Consider flexible sigmoidoscopy to evaluate for microscopic colitis after above work-up if negative  Follow up with Dr. Bonna Gains   Dr Sherri Sear, MD

## 2019-01-08 DIAGNOSIS — R140 Abdominal distension (gaseous): Secondary | ICD-10-CM | POA: Diagnosis not present

## 2019-01-08 DIAGNOSIS — R109 Unspecified abdominal pain: Secondary | ICD-10-CM | POA: Diagnosis not present

## 2019-01-08 DIAGNOSIS — K529 Noninfective gastroenteritis and colitis, unspecified: Secondary | ICD-10-CM | POA: Diagnosis not present

## 2019-01-14 ENCOUNTER — Other Ambulatory Visit: Payer: Self-pay | Admitting: Surgery

## 2019-01-14 ENCOUNTER — Other Ambulatory Visit: Payer: Self-pay

## 2019-01-14 ENCOUNTER — Ambulatory Visit
Admission: RE | Admit: 2019-01-14 | Discharge: 2019-01-14 | Disposition: A | Discharge status: Home / Self Care | Payer: Medicare Other | Source: Ambulatory Visit | Attending: Surgery | Admitting: Surgery

## 2019-01-14 DIAGNOSIS — R1011 Right upper quadrant pain: Secondary | ICD-10-CM

## 2019-01-14 DIAGNOSIS — R109 Unspecified abdominal pain: Secondary | ICD-10-CM | POA: Diagnosis not present

## 2019-01-14 MED ORDER — TECHNETIUM TC 99M MEBROFENIN IV KIT
4.7940 | PACK | Freq: Once | INTRAVENOUS | Status: AC | PRN
  Administered 2019-01-14: 4.794 via INTRAVENOUS

## 2019-01-16 ENCOUNTER — Other Ambulatory Visit: Payer: Self-pay

## 2019-01-16 ENCOUNTER — Ambulatory Visit (INDEPENDENT_AMBULATORY_CARE_PROVIDER_SITE_OTHER): Payer: Medicare Other | Admitting: Adult Health

## 2019-01-16 ENCOUNTER — Encounter: Payer: Self-pay | Admitting: Adult Health

## 2019-01-16 VITALS — BP 143/90 | HR 93 | Temp 97.4°F | Resp 16 | Ht 69.0 in | Wt 248.0 lb

## 2019-01-16 DIAGNOSIS — I10 Essential (primary) hypertension: Secondary | ICD-10-CM

## 2019-01-16 DIAGNOSIS — K219 Gastro-esophageal reflux disease without esophagitis: Secondary | ICD-10-CM

## 2019-01-16 DIAGNOSIS — F17210 Nicotine dependence, cigarettes, uncomplicated: Secondary | ICD-10-CM

## 2019-01-16 DIAGNOSIS — R1084 Generalized abdominal pain: Secondary | ICD-10-CM

## 2019-01-16 DIAGNOSIS — E1165 Type 2 diabetes mellitus with hyperglycemia: Secondary | ICD-10-CM

## 2019-01-16 LAB — POCT GLYCOSYLATED HEMOGLOBIN (HGB A1C): Hemoglobin A1C: 10.1 % — AB (ref 4.0–5.6)

## 2019-01-16 MED ORDER — FAMOTIDINE 20 MG PO TABS: 20.0000 mg | ORAL_TABLET | Freq: Two times a day (BID) | ORAL | 3 refills | Status: DC

## 2019-01-16 NOTE — Progress Notes (Signed)
[facility] [place], [place] [address]  Internal MEDICINE  Office Visit Note  Patient Name: Raymond [name]  [id number]  [id number]  Date of Service: 01/16/2019  Chief Complaint  Patient presents with  . Diabetes  . Hypertension  . Hyperlipidemia  . Anxiety    HPI Patient is here for A1C follow-up, today's A1C is 10.1, up from 9.3. Patient reports he does not take his sliding scale insulin regularly due to not eating regularly due to abdominal pain and bloating. Scheduled to follow-up with surgeon tomorrow regarding incomplete HIDA scan, possible gallbladder disease. Unable to exercise regularly as he has in the past due to abdominal pain and bloating. Reports severe headaches, blurred vision and numbness in feet.    Current Medication: Outpatient Encounter Medications as of 01/16/2019  Medication Sig Note  . ACCU-CHEK FASTCLIX LANCETS MISC yse as directed twice a day   . albuterol (PROAIR HFA) 108 (90 Base) MCG/ACT inhaler Inhale 2-4 puffs by mouth Every 4-6 hours as needed for wheezing, cough, and/or shortness of breath   . aspirin EC 81 MG tablet Take 81 mg by mouth.   . famotidine (PEPCID) 20 MG tablet Take 1 tablet (20 mg total) by mouth 2 (two) times daily.   . fluticasone (FLONASE) 50 MCG/ACT nasal spray 2 sprays by Each Nare route daily. 07/06/2017: Ran out  and needs new prescription  . gabapentin (NEURONTIN) 100 MG capsule Take 1 capsule (100 mg total) by mouth 3 (three) times daily.   Marland Kitchen gentamicin cream (GARAMYCIN) 0.1 % Apply 1 application topically 2 (two) times daily.   Marland Kitchen glucose blood (ACCU-CHEK AVIVA PLUS) test strip Blood sugar testing TID and as needed. E11.65   . hydrochlorothiazide (HYDRODIURIL) 25 MG tablet Take 25 mg by mouth daily.  05/30/2017: Patient reports taking but no recent fills per pharmacy records   . hydrOXYzine (VISTARIL) 50 MG capsule Take 1 capsule (50 mg total) by mouth at bedtime as needed. FOR SLEEP   . Insulin  Glargine, 2 Unit Dial, (TOUJEO MAX SOLOSTAR) 300 UNIT/ML SOPN Inject 60 Units into the skin daily.   . insulin lispro (HUMALOG KWIKPEN) 100 UNIT/ML KwikPen Use as directed per  sliding scale max units 8-10 per meal   . metoprolol tartrate (LOPRESSOR) 100 MG tablet Take 1 tablet (100 mg total) by mouth 2 (two) times daily.   . mirtazapine (REMERON) 45 MG tablet Take 1 tablet (45 mg total) by mouth at bedtime.   . nitroGLYCERIN (NITROSTAT) 0.4 MG SL tablet Place 0.4 mg under the tongue every 5 (five) minutes as needed for chest pain.   . pantoprazole (PROTONIX) 40 MG tablet Take 1 tablet (40 mg total) by mouth daily.   . QUEtiapine (SEROQUEL) 50 MG tablet Take 1 tablet (50 mg total) by mouth 2 (two) times daily as needed.   . sitaGLIPtin-metformin (JANUMET) 50-500 MG tablet Take 1 tablet by mouth 2 (two) times daily.   . traZODone (DESYREL) 100 MG tablet Take 2.5 tablets (250 mg total) by mouth at bedtime.   . dicyclomine (BENTYL) 10 MG capsule Take 1 capsule (10 mg total) by mouth 4 (four) times daily -  before meals and at bedtime for 30 doses.    No facility-administered encounter medications on file as of 01/16/2019.     Surgical History: Past Surgical History:  Procedure Laterality Date  .  ingrown toenail removal    . CLAVICLE SURGERY Right   . COLONOSCOPY  2017  . SHOULDER SURGERY  Right   . UPPER GI ENDOSCOPY  2017    Medical History: Past Medical History:  Diagnosis Date  . Anxiety   . Depression   . Diabetes mellitus without complication ([place])   . 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 GERD (gastroesophageal reflux disease)   . Headache   . Hyperlipidemia   . Hypertension   . Irregular heart beat unk  . Myocardial infarction ([place])   . PTSD (post-traumatic stress disorder)   . Tachycardia     Family History: Family History  Problem Relation Age of Onset  . Hypertension Mother   . Diabetes type II Mother   . Diabetes Mother   . COPD Father   . Cancer Father    . Heart disease Father   . Diabetes Sister   . Anxiety disorder Sister   . Depression Sister   . Diabetes Brother   . Anxiety disorder Brother   . Depression Brother   . Diabetes Maternal Aunt   . Diabetes Sister   . Anxiety disorder Sister   . Depression Sister   . Diabetes Sister   . Anxiety disorder Sister   . Depression Sister   . Diabetes Sister   . Anxiety disorder Sister   . Depression Sister   . Colon cancer Maternal Uncle     Social History   Socioeconomic History  . Marital status: Divorced    Spouse name: Not on file  . Number of children: 4  . Years of education: Not on file  . Highest education level: 8th grade  Occupational History  . Not on file  Social Needs  . Financial resource strain: Very hard  . Food insecurity    Worry: Often true    Inability: Often true  . Transportation needs    Medical: No    Non-medical: No  Tobacco Use  . Smoking status: Current Every Day Smoker    Packs/day: 2.00    Years: 40.00    Pack years: 80.00    Types: Cigarettes  . Smokeless tobacco: Former Systems developer    Types: Chew  . Tobacco comment: Reports has tried multiple things to help quit and cut back.   Substance and Sexual Activity  . Alcohol use: No    Alcohol/week: 0.0 standard drinks    Frequency: Never  . Drug use: No  . Sexual activity: Not Currently  Lifestyle  . Physical activity    Days per week: 0 days    Minutes per session: 0 min  . Stress: Very much  Relationships  . Social Herbalist on phone: Not on file    Gets together: Not on file    Attends religious service: Never    Active member of club or organization: No    Attends meetings of clubs or organizations: Never    Relationship status: Divorced  . Intimate partner violence    Fear of current or ex partner: No    Emotionally abused: No    Physically abused: No    Forced sexual activity: No  Other Topics Concern  . Not on file  Social History Narrative  . Not on file       Review of Systems  Constitutional: Negative.  Negative for chills, fatigue and unexpected weight change.  HENT: Negative.  Negative for congestion, rhinorrhea, sneezing and sore throat.   Eyes: Negative for redness.       Blurred vision  Respiratory: Negative.  Negative for cough, chest tightness and  shortness of breath.   Cardiovascular: Negative.  Negative for chest pain and palpitations.  Gastrointestinal: Positive for abdominal distention, abdominal pain and nausea. Negative for constipation, diarrhea and vomiting.  Endocrine: Negative.   Genitourinary: Negative.  Negative for dysuria and frequency.  Musculoskeletal: Positive for back pain. Negative for arthralgias, joint swelling and neck pain.  Skin: Negative.  Negative for rash.  Allergic/Immunologic: Negative.   Neurological: Positive for weakness and numbness. Negative for tremors.       Bilateral foot numbness, tingling and pain  Hematological: Negative for adenopathy. Does not bruise/bleed easily.  Psychiatric/Behavioral: Negative.  Negative for behavioral problems, sleep disturbance and suicidal ideas. The patient is not nervous/anxious.       Vital Signs: BP (!) 143/90   Pulse 93   Temp (!) 97.4 F (36.3 C)   Resp 16   Ht 5\' 9"  (1.753 m)   Wt 248 lb (112.5 kg)   SpO2 94%   BMI 36.62 kg/m    Physical Exam Vitals signs and nursing note reviewed.  Constitutional:      General: He is not in acute distress.    Appearance: He is well-developed. He is not diaphoretic.  HENT:     Head: Normocephalic and atraumatic.     Mouth/Throat:     Pharynx: No oropharyngeal exudate.  Eyes:     Pupils: Pupils are equal, round, and reactive to light.  Neck:     Musculoskeletal: Normal range of motion and neck supple.     Thyroid: No thyromegaly.     Vascular: No JVD.     Trachea: No tracheal deviation.  Cardiovascular:     Rate and Rhythm: Normal rate and regular rhythm.     Heart sounds: Normal heart sounds. No  murmur. No friction rub. No gallop.   Pulmonary:     Effort: Pulmonary effort is normal. No respiratory distress.     Breath sounds: Normal breath sounds. No wheezing or rales.  Chest:     Chest wall: No tenderness.  Abdominal:     General: There is distension.     Palpations: Abdomen is soft.     Tenderness: There is abdominal tenderness. There is no guarding.  Musculoskeletal: Normal range of motion.  Lymphadenopathy:     Cervical: No cervical adenopathy.  Skin:    General: Skin is warm and dry.  Neurological:     Mental Status: He is alert and oriented to person, place, and time.     Cranial Nerves: No cranial nerve deficit.  Psychiatric:        Behavior: Behavior normal.        Thought Content: Thought content normal.        Judgment: Judgment normal.     Assessment/Plan: 1. Uncontrolled type 2 diabetes mellitus with hyperglycemia (HCC) A1C elevated to 10.1 today, up from last A1C of 9.3. Reports not taking sliding scale insulin, educated importance of taking sliding scale insulin as well as all other diabetic medications as prescribed. Informed to continue to increase long acting insulin to have a goal of blood glucose >180 in the AM, currently taking 64 units. - POCT HgB A1C  2. Generalized abdominal pain Scheduled to see general surgeon tomorrow to follow-up for abnormal gallbladder scan and chronic abdominal pain and bloating.  3. Essential hypertension Blood pressure slightly elevated today, educated patient to take medication as prescribed. Will continue to monitor.  4. Gastroesophageal reflux disease without esophagitis Stable on current therapy, continue to monitor. - famotidine (PEPCID)  20 MG tablet; Take 1 tablet (20 mg total) by mouth 2 (two) times daily.  Dispense: 60 tablet; Refill: 3  5. Cigarette nicotine dependence without complication Smokes 1-2 packs per day, educated to cut back on amount of smoking and informed patient of negative health side effects  associated with smoking.  6. Morbid obesity ([place]) Reports not being able to exercise regularly due to back pain. Encouraged patient to walk daily.  General Counseling: Avyaan verbalizes understanding of the findings of todays visit and agrees with plan of treatment. I have discussed any further diagnostic evaluation that may be needed or ordered today. We also reviewed his medications today. he has been encouraged to call the office with any questions or concerns that should arise related to todays visit.    Orders Placed This Encounter  Procedures  . POCT HgB A1C    No orders of the defined types were placed in this encounter.   Time spent: 25 Minutes   This patient was seen by Orson Gear AGNP-C in Collaboration with Dr Lavera Guise as a part of collaborative care agreement     Kendell Bane AGNP-C Internal medicine

## 2019-01-17 ENCOUNTER — Ambulatory Visit (INDEPENDENT_AMBULATORY_CARE_PROVIDER_SITE_OTHER): Payer: Medicare Other | Admitting: Surgery

## 2019-01-17 ENCOUNTER — Telehealth: Payer: Self-pay

## 2019-01-17 ENCOUNTER — Encounter: Payer: Self-pay | Admitting: Surgery

## 2019-01-17 ENCOUNTER — Other Ambulatory Visit: Payer: Self-pay

## 2019-01-17 VITALS — BP 142/88 | HR 93 | Temp 97.2°F | Resp 14 | Ht 69.0 in | Wt 248.0 lb

## 2019-01-17 DIAGNOSIS — R1011 Right upper quadrant pain: Secondary | ICD-10-CM

## 2019-01-17 NOTE — Telephone Encounter (Signed)
Medical Clearance faxed to Leretha Pol NP at this time.

## 2019-01-17 NOTE — Progress Notes (Signed)
01/17/2019  History of Present Illness: Raymond [name] is a 53 y.o. male presenting for follow-up of right upper quadrant abdominal pain.  Patient reports today that his pain is mostly epigastric and right upper quadrant and then when he eats he feels very bloated with some discomfort in the low abdomen as well as diarrhea after eating.  He is also reporting low back pain on both sides which he has been recently told he has a bulging disc.  He also has issues with acid reflux which has somewhat improved with antacid therapy.  His work-up has consisted of CAT scan as well as ultrasounds, upper endoscopy as well as colonoscopy.  His ultrasounds have shown a contracted gallbladder with stones.  He recently had a HIDA scan that I ordered after our last visit on 9/11.  HIDA scan was done on 9/22 which was not able to visualize gallbladder filling but had a patent CBD with contrast going into the small intestine readily.  This was concerning for possible chronic cholecystitis.  He presents today for follow-up and to discuss potential surgical options.  Past Medical History: Past Medical History:  Diagnosis Date  . Anxiety   . Depression   . Diabetes mellitus without complication ([place])   . 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 GERD (gastroesophageal reflux disease)   . Headache   . Hyperlipidemia   . Hypertension   . Irregular heart beat unk  . Myocardial infarction ([place])   . PTSD (post-traumatic stress disorder)   . Tachycardia      Past Surgical History: Past Surgical History:  Procedure Laterality Date  .  ingrown toenail removal    . CLAVICLE SURGERY Right   . COLONOSCOPY  2017  . SHOULDER SURGERY Right   . UPPER GI ENDOSCOPY  2017    Home Medications: Prior to Admission medications   Medication Sig Start Date End Date Taking? Authorizing Provider  ACCU-CHEK FASTCLIX LANCETS MISC yse as directed twice a day 05/07/17  Yes Boscia, Greer Ee, NP  albuterol (PROAIR HFA) 108 (90  Base) MCG/ACT inhaler Inhale 2-4 puffs by mouth Every 4-6 hours as needed for wheezing, cough, and/or shortness of breath 07/31/18  Yes Ronnell Freshwater, NP  aspirin EC 81 MG tablet Take 81 mg by mouth daily at 3 pm.  01/12/16  Yes [provider]  famotidine (PEPCID) 20 MG tablet Take 1 tablet (20 mg total) by mouth 2 (two) times daily. 01/16/19  Yes Scarboro, Audie Clear, NP  gabapentin (NEURONTIN) 100 MG capsule Take 1 capsule (100 mg total) by mouth 3 (three) times daily. 11/12/18  Yes Edrick Kins, DPM  gentamicin cream (GARAMYCIN) 0.1 % Apply 1 application topically 2 (two) times daily. Patient not taking: Reported on 01/17/2019 10/29/18  Yes Edrick Kins, DPM  glucose blood (ACCU-CHEK AVIVA PLUS) test strip Blood sugar testing TID and as needed. E11.65 10/05/17  Yes Boscia, Greer Ee, NP  hydrOXYzine (VISTARIL) 50 MG capsule Take 1 capsule (50 mg total) by mouth at bedtime as needed. FOR SLEEP Patient not taking: Reported on 01/17/2019 07/31/18  Yes Eappen, Ria Clock, MD  Insulin Glargine, 2 Unit Dial, (TOUJEO MAX SOLOSTAR) 300 UNIT/ML SOPN Inject 60 Units into the skin daily. Patient taking differently: Inject 64 Units into the skin at bedtime.  11/18/18  Yes Boscia, Greer Ee, NP  insulin lispro (HUMALOG KWIKPEN) 100 UNIT/ML KwikPen Use as directed per  sliding scale max units 8-10 per meal Patient taking differently: Inject 12-15 Units into  the skin 3 (three) times daily between meals. Sliding Scale Insulin 12/03/18  Yes Boscia, Heather E, NP  metoprolol tartrate (LOPRESSOR) 100 MG tablet Take 1 tablet (100 mg total) by mouth 2 (two) times daily. 11/18/18  Yes Boscia, Greer Ee, NP  mirtazapine (REMERON) 45 MG tablet Take 1 tablet (45 mg total) by mouth at bedtime. 11/18/18  Yes Ursula Alert, MD  nitroGLYCERIN (NITROSTAT) 0.4 MG SL tablet Place 0.4 mg under the tongue every 5 (five) minutes x 3 doses as needed for chest pain.    Yes [provider]  pantoprazole (PROTONIX) 40 MG tablet  Take 1 tablet (40 mg total) by mouth daily. 09/13/18  Yes Boscia, Greer Ee, NP  QUEtiapine (SEROQUEL) 50 MG tablet Take 1 tablet (50 mg total) by mouth 2 (two) times daily as needed. Patient taking differently: Take 50 mg by mouth 2 (two) times daily.  11/18/18  Yes Ursula Alert, MD  sitaGLIPtin-metformin (JANUMET) 50-500 MG tablet Take 1 tablet by mouth 2 (two) times daily. 04/01/18  Yes Ronnell Freshwater, NP  traZODone (DESYREL) 100 MG tablet Take 2.5 tablets (250 mg total) by mouth at bedtime. 11/18/18  Yes Ursula Alert, MD  dicyclomine (BENTYL) 10 MG capsule Take 1 capsule (10 mg total) by mouth 4 (four) times daily -  before meals and at bedtime for 30 doses. 01/07/19 01/17/20  Lin Landsman, MD    Allergies: Allergies  Allergen Reactions  . Onion Anaphylaxis  . Hydrocodone   . Norco [Hydrocodone-Acetaminophen] Itching  . Hydrocodone-Acetaminophen Itching    Review of Systems: Review of Systems  Constitutional: Negative for chills and fever.  Respiratory: Negative for shortness of breath.   Cardiovascular: Negative for chest pain.  Gastrointestinal: Positive for abdominal pain, diarrhea and heartburn. Negative for nausea and vomiting.  Musculoskeletal: Positive for back pain.    Physical Exam BP (!) 142/88   Pulse 93   Temp (!) 97.2 F (36.2 C) (Temporal)   Resp 14   Ht 5\' 9"  (1.753 m)   Wt 248 lb (112.5 kg)   SpO2 95%   BMI 36.62 kg/m  CONSTITUTIONAL: No acute distress HEENT:  Normocephalic, atraumatic, extraocular motion intact. RESPIRATORY:  Lungs are clear, and breath sounds are equal bilaterally. Normal respiratory effort without pathologic use of accessory muscles. CARDIOVASCULAR: Heart is regular without murmurs, gallops, or rubs. GI: The abdomen is soft, obese, nondistended, with tenderness to palpation in the epigastric area as well as right upper quadrant.  No particular discomfort or tenderness in the low abdomen.  Patient has diastases recti.    NEUROLOGIC:  Motor and sensation is grossly normal.  Cranial nerves are grossly intact. PSYCH:  Alert and oriented to person, place and time. Affect is normal.  Labs/Imaging: HIDA scan on 01/14/2019: IMPRESSION: Non filling of the gallbladder over 90 minutes. Gallbladder contracted on comparison CT 12/13/2018. Findings could indicate chronic cholecystitis. Patient does not have symptomology consistent with acute cholecystitis.   Assessment and Plan: This is a 53 y.o. male presenting for follow-up of right upper quadrant pain with a HIDA scan showing nonfilling of the gallbladder.  -Discussed with the patient that the HIDA scan showing nonfilling of the gallbladder may explain some of the symptoms particularly of right upper quadrant pain and perhaps bloating.  However this would not explain any back pain or necessarily diarrhea and would not be contributing to any worsening or improvement of his acid reflux.  However given the findings on the HIDA scan, I think it is  reasonable to pursue cholecystectomy to help with some of his symptoms. -Discussed with him the role for laparoscopic cholecystectomy particularly the risks of bleeding, infection, and injury to surrounding structures.  Discussed with him that we would want clearance from his PCP given his comorbidities and so that we can stop his aspirin 5 days before surgery.  His glucose has been elevated recently his A1c was 10.1 on 01/16/2019.  He reports that he is not able to eat well and so he has not been using his sliding scale insulin as much.  Discussed with him the importance of keeping a better glucose control so that there are fewer complications with wound healing or infections.  He will do better in controlling his glucose. - With regards to timing, the patient currently needs to set up transportation and is not sure of exactly what they would be better.  From my standpoint we could do this as early as 01/23/2019.  The patient will give [place]  a call early next week once he has arranged transportation to see what days would be better for him for surgery.  I did discuss with him that my typical OR days are Mondays and Thursdays.  We will wait to hear from him but in the meantime we will send clearance form for his PCP.  Face-to-face time spent with the patient and care providers was 25 minutes, with more than 50% of the time spent counseling, educating, and coordinating care of the patient.     Melvyn Neth, [facility]

## 2019-01-17 NOTE — Patient Instructions (Addendum)
Please stop the Aspirin 5 days prior to the surgery.  Our surgery scheduler will call you to schedule your surgery, please have the blue surgery sheet available.   We will send medical clearance to your physician.    Laparoscopic Cholecystectomy Laparoscopic cholecystectomy is surgery to remove the gallbladder. The gallbladder is a pear-shaped organ that lies beneath the liver on the right side of the body. The gallbladder stores bile, which is a fluid that helps the body to digest fats. Cholecystectomy is often done for inflammation of the gallbladder (cholecystitis). This condition is usually caused by a buildup of gallstones (cholelithiasis) in the gallbladder. Gallstones can block the flow of bile, which can result in inflammation and pain. In severe cases, emergency surgery may be required. This procedure is done though small incisions in your abdomen (laparoscopic surgery). A thin scope with a camera (laparoscope) is inserted through one incision. Thin surgical instruments are inserted through the other incisions. In some cases, a laparoscopic procedure may be turned into a type of surgery that is done through a larger incision (open surgery). Tell a health care provider about:  Any allergies you have.  All medicines you are taking, including vitamins, herbs, eye drops, creams, and over-the-counter medicines.  Any problems you or family members have had with anesthetic medicines.  Any blood disorders you have.  Any surgeries you have had.  Any medical conditions you have.  Whether you are pregnant or may be pregnant. What are the risks? Generally, this is a safe procedure. However, problems may occur, including:  Infection.  Bleeding.  Allergic reactions to medicines.  Damage to other structures or organs.  A stone remaining in the common bile duct. The common bile duct carries bile from the gallbladder into the small intestine.  A bile leak from the cyst duct that is  clipped when your gallbladder is removed. What happens before the procedure? Staying hydrated Follow instructions from your health care provider about hydration, which may include:  Up to 2 hours before the procedure - you may continue to drink clear liquids, such as water, clear fruit juice, black coffee, and plain tea. Eating and drinking restrictions Follow instructions from your health care provider about eating and drinking, which may include:  8 hours before the procedure - stop eating heavy meals or foods such as meat, fried foods, or fatty foods.  6 hours before the procedure - stop eating light meals or foods, such as toast or cereal.  6 hours before the procedure - stop drinking milk or drinks that contain milk.  2 hours before the procedure - stop drinking clear liquids. Medicines  Ask your health care provider about: ? Changing or stopping your regular medicines. This is especially important if you are taking diabetes medicines or blood thinners. ? Taking medicines such as aspirin and ibuprofen. These medicines can thin your blood. Do not take these medicines before your procedure if your health care provider instructs you not to.  You may be given antibiotic medicine to help prevent infection. General instructions  Let your health care provider know if you develop a cold or an infection before surgery.  Plan to have someone take you home from the hospital or clinic.  Ask your health care provider how your surgical site will be marked or identified. What happens during the procedure?   To reduce your risk of infection: ? Your health care team will wash or sanitize their hands. ? Your skin will be washed with soap. ?  Hair may be removed from the surgical area.  An IV tube may be inserted into one of your veins.  You will be given one or more of the following: ? A medicine to help you relax (sedative). ? A medicine to make you fall asleep (general anesthetic).  A  breathing tube will be placed in your mouth.  Your surgeon will make several small cuts (incisions) in your abdomen.  The laparoscope will be inserted through one of the small incisions. The camera on the laparoscope will send images to a TV screen (monitor) in the operating room. This lets your surgeon see inside your abdomen.  Air-like gas will be pumped into your abdomen. This will expand your abdomen to give the surgeon more room to perform the surgery.  Other tools that are needed for the procedure will be inserted through the other incisions. The gallbladder will be removed through one of the incisions.  Your common bile duct may be examined. If stones are found in the common bile duct, they may be removed.  After your gallbladder has been removed, the incisions will be closed with stitches (sutures), staples, or skin glue.  Your incisions may be covered with a bandage (dressing). The procedure may vary among health care providers and hospitals. What happens after the procedure?  Your blood pressure, heart rate, breathing rate, and blood oxygen level will be monitored until the medicines you were given have worn off.  You will be given medicines as needed to control your pain.  Do not drive for 24 hours if you were given a sedative. This information is not intended to replace advice given to you by your health care provider. Make sure you discuss any questions you have with your health care provider. Document Released: 04/10/2005 Document Revised: 03/23/2017 Document Reviewed: 09/27/2015 Elsevier Patient Education  2020 [employer].

## 2019-01-17 NOTE — H&P (View-Only) (Signed)
01/17/2019  History of Present Illness: Raymond [name] is a 53 y.o. male presenting for follow-up of right upper quadrant abdominal pain.  Patient reports today that his pain is mostly epigastric and right upper quadrant and then when he eats he feels very bloated with some discomfort in the low abdomen as well as diarrhea after eating.  He is also reporting low back pain on both sides which he has been recently told he has a bulging disc.  He also has issues with acid reflux which has somewhat improved with antacid therapy.  His work-up has consisted of CAT scan as well as ultrasounds, upper endoscopy as well as colonoscopy.  His ultrasounds have shown a contracted gallbladder with stones.  He recently had a HIDA scan that I ordered after our last visit on 9/11.  HIDA scan was done on 9/22 which was not able to visualize gallbladder filling but had a patent CBD with contrast going into the small intestine readily.  This was concerning for possible chronic cholecystitis.  He presents today for follow-up and to discuss potential surgical options.  Past Medical History: Past Medical History:  Diagnosis Date  . Anxiety   . Depression   . Diabetes mellitus without complication ([place])   . 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 GERD (gastroesophageal reflux disease)   . Headache   . Hyperlipidemia   . Hypertension   . Irregular heart beat unk  . Myocardial infarction ([place])   . PTSD (post-traumatic stress disorder)   . Tachycardia      Past Surgical History: Past Surgical History:  Procedure Laterality Date  .  ingrown toenail removal    . CLAVICLE SURGERY Right   . COLONOSCOPY  2017  . SHOULDER SURGERY Right   . UPPER GI ENDOSCOPY  2017    Home Medications: Prior to Admission medications   Medication Sig Start Date End Date Taking? Authorizing Provider  ACCU-CHEK FASTCLIX LANCETS MISC yse as directed twice a day 05/07/17  Yes Boscia, Greer Ee, NP  albuterol (PROAIR HFA) 108 (90  Base) MCG/ACT inhaler Inhale 2-4 puffs by mouth Every 4-6 hours as needed for wheezing, cough, and/or shortness of breath 07/31/18  Yes Ronnell Freshwater, NP  aspirin EC 81 MG tablet Take 81 mg by mouth daily at 3 pm.  01/12/16  Yes [provider]  famotidine (PEPCID) 20 MG tablet Take 1 tablet (20 mg total) by mouth 2 (two) times daily. 01/16/19  Yes Scarboro, Audie Clear, NP  gabapentin (NEURONTIN) 100 MG capsule Take 1 capsule (100 mg total) by mouth 3 (three) times daily. 11/12/18  Yes Edrick Kins, DPM  gentamicin cream (GARAMYCIN) 0.1 % Apply 1 application topically 2 (two) times daily. Patient not taking: Reported on 01/17/2019 10/29/18  Yes Edrick Kins, DPM  glucose blood (ACCU-CHEK AVIVA PLUS) test strip Blood sugar testing TID and as needed. E11.65 10/05/17  Yes Boscia, Greer Ee, NP  hydrOXYzine (VISTARIL) 50 MG capsule Take 1 capsule (50 mg total) by mouth at bedtime as needed. FOR SLEEP Patient not taking: Reported on 01/17/2019 07/31/18  Yes Eappen, Ria Clock, MD  Insulin Glargine, 2 Unit Dial, (TOUJEO MAX SOLOSTAR) 300 UNIT/ML SOPN Inject 60 Units into the skin daily. Patient taking differently: Inject 64 Units into the skin at bedtime.  11/18/18  Yes Boscia, Greer Ee, NP  insulin lispro (HUMALOG KWIKPEN) 100 UNIT/ML KwikPen Use as directed per  sliding scale max units 8-10 per meal Patient taking differently: Inject 12-15 Units into  the skin 3 (three) times daily between meals. Sliding Scale Insulin 12/03/18  Yes Boscia, Heather E, NP  metoprolol tartrate (LOPRESSOR) 100 MG tablet Take 1 tablet (100 mg total) by mouth 2 (two) times daily. 11/18/18  Yes Boscia, Greer Ee, NP  mirtazapine (REMERON) 45 MG tablet Take 1 tablet (45 mg total) by mouth at bedtime. 11/18/18  Yes Ursula Alert, MD  nitroGLYCERIN (NITROSTAT) 0.4 MG SL tablet Place 0.4 mg under the tongue every 5 (five) minutes x 3 doses as needed for chest pain.    Yes [provider]  pantoprazole (PROTONIX) 40 MG tablet  Take 1 tablet (40 mg total) by mouth daily. 09/13/18  Yes Boscia, Greer Ee, NP  QUEtiapine (SEROQUEL) 50 MG tablet Take 1 tablet (50 mg total) by mouth 2 (two) times daily as needed. Patient taking differently: Take 50 mg by mouth 2 (two) times daily.  11/18/18  Yes Ursula Alert, MD  sitaGLIPtin-metformin (JANUMET) 50-500 MG tablet Take 1 tablet by mouth 2 (two) times daily. 04/01/18  Yes Ronnell Freshwater, NP  traZODone (DESYREL) 100 MG tablet Take 2.5 tablets (250 mg total) by mouth at bedtime. 11/18/18  Yes Ursula Alert, MD  dicyclomine (BENTYL) 10 MG capsule Take 1 capsule (10 mg total) by mouth 4 (four) times daily -  before meals and at bedtime for 30 doses. 01/07/19 01/17/20  Lin Landsman, MD    Allergies: Allergies  Allergen Reactions  . Onion Anaphylaxis  . Hydrocodone   . Norco [Hydrocodone-Acetaminophen] Itching  . Hydrocodone-Acetaminophen Itching    Review of Systems: Review of Systems  Constitutional: Negative for chills and fever.  Respiratory: Negative for shortness of breath.   Cardiovascular: Negative for chest pain.  Gastrointestinal: Positive for abdominal pain, diarrhea and heartburn. Negative for nausea and vomiting.  Musculoskeletal: Positive for back pain.    Physical Exam BP (!) 142/88   Pulse 93   Temp (!) 97.2 F (36.2 C) (Temporal)   Resp 14   Ht 5\' 9"  (1.753 m)   Wt 248 lb (112.5 kg)   SpO2 95%   BMI 36.62 kg/m  CONSTITUTIONAL: No acute distress HEENT:  Normocephalic, atraumatic, extraocular motion intact. RESPIRATORY:  Lungs are clear, and breath sounds are equal bilaterally. Normal respiratory effort without pathologic use of accessory muscles. CARDIOVASCULAR: Heart is regular without murmurs, gallops, or rubs. GI: The abdomen is soft, obese, nondistended, with tenderness to palpation in the epigastric area as well as right upper quadrant.  No particular discomfort or tenderness in the low abdomen.  Patient has diastases recti.    NEUROLOGIC:  Motor and sensation is grossly normal.  Cranial nerves are grossly intact. PSYCH:  Alert and oriented to person, place and time. Affect is normal.  Labs/Imaging: HIDA scan on 01/14/2019: IMPRESSION: Non filling of the gallbladder over 90 minutes. Gallbladder contracted on comparison CT 12/13/2018. Findings could indicate chronic cholecystitis. Patient does not have symptomology consistent with acute cholecystitis.   Assessment and Plan: This is a 53 y.o. male presenting for follow-up of right upper quadrant pain with a HIDA scan showing nonfilling of the gallbladder.  -Discussed with the patient that the HIDA scan showing nonfilling of the gallbladder may explain some of the symptoms particularly of right upper quadrant pain and perhaps bloating.  However this would not explain any back pain or necessarily diarrhea and would not be contributing to any worsening or improvement of his acid reflux.  However given the findings on the HIDA scan, I think it is  reasonable to pursue cholecystectomy to help with some of his symptoms. -Discussed with him the role for laparoscopic cholecystectomy particularly the risks of bleeding, infection, and injury to surrounding structures.  Discussed with him that we would want clearance from his PCP given his comorbidities and so that we can stop his aspirin 5 days before surgery.  His glucose has been elevated recently his A1c was 10.1 on 01/16/2019.  He reports that he is not able to eat well and so he has not been using his sliding scale insulin as much.  Discussed with him the importance of keeping a better glucose control so that there are fewer complications with wound healing or infections.  He will do better in controlling his glucose. - With regards to timing, the patient currently needs to set up transportation and is not sure of exactly what they would be better.  From my standpoint we could do this as early as 01/23/2019.  The patient will give [place]  a call early next week once he has arranged transportation to see what days would be better for him for surgery.  I did discuss with him that my typical OR days are Mondays and Thursdays.  We will wait to hear from him but in the meantime we will send clearance form for his PCP.  Face-to-face time spent with the patient and care providers was 25 minutes, with more than 50% of the time spent counseling, educating, and coordinating care of the patient.     Melvyn Neth, [facility]

## 2019-01-19 LAB — CALPROTECTIN, FECAL: Calprotectin, Fecal: 74 ug/g (ref 0–120)

## 2019-01-19 LAB — PANCREATIC ELASTASE, FECAL: Pancreatic Elastase, Fecal: >500 ug Elast./g (ref >200)

## 2019-01-20 ENCOUNTER — Other Ambulatory Visit
Admission: RE | Admit: 2019-01-20 | Discharge: 2019-01-20 | Disposition: A | Discharge status: Home / Self Care | Payer: Medicare Other | Source: Ambulatory Visit | Attending: Surgery | Admitting: Surgery

## 2019-01-20 ENCOUNTER — Ambulatory Visit (INDEPENDENT_AMBULATORY_CARE_PROVIDER_SITE_OTHER): Payer: Medicare Other | Admitting: Psychiatry

## 2019-01-20 ENCOUNTER — Ambulatory Visit: Payer: Medicare Other | Admitting: Nurse Practitioner

## 2019-01-20 ENCOUNTER — Encounter: Payer: Self-pay | Admitting: Nurse Practitioner

## 2019-01-20 ENCOUNTER — Encounter: Payer: Self-pay | Admitting: Psychiatry

## 2019-01-20 ENCOUNTER — Other Ambulatory Visit
Admission: RE | Admit: 2019-01-20 | Discharge: 2019-01-20 | Disposition: A | Discharge status: Home / Self Care | Payer: Medicare Other | Source: Ambulatory Visit | Attending: Nurse Practitioner | Admitting: Nurse Practitioner

## 2019-01-20 ENCOUNTER — Ambulatory Visit (INDEPENDENT_AMBULATORY_CARE_PROVIDER_SITE_OTHER): Payer: Medicare Other | Admitting: Nurse Practitioner

## 2019-01-20 ENCOUNTER — Other Ambulatory Visit: Payer: Self-pay

## 2019-01-20 ENCOUNTER — Telehealth: Payer: Self-pay | Admitting: Surgery

## 2019-01-20 VITALS — BP 146/97 | HR 116 | Temp 98.2°F | Resp 16 | Ht 69.0 in | Wt 248.0 lb

## 2019-01-20 DIAGNOSIS — F4310 Post-traumatic stress disorder, unspecified: Secondary | ICD-10-CM | POA: Diagnosis not present

## 2019-01-20 DIAGNOSIS — F331 Major depressive disorder, recurrent, moderate: Secondary | ICD-10-CM | POA: Diagnosis not present

## 2019-01-20 DIAGNOSIS — Z20828 Contact with and (suspected) exposure to other viral communicable diseases: Secondary | ICD-10-CM | POA: Diagnosis not present

## 2019-01-20 DIAGNOSIS — F5105 Insomnia due to other mental disorder: Secondary | ICD-10-CM | POA: Diagnosis not present

## 2019-01-20 DIAGNOSIS — I10 Essential (primary) hypertension: Secondary | ICD-10-CM

## 2019-01-20 DIAGNOSIS — E1165 Type 2 diabetes mellitus with hyperglycemia: Secondary | ICD-10-CM

## 2019-01-20 DIAGNOSIS — Z01812 Encounter for preprocedural laboratory examination: Secondary | ICD-10-CM | POA: Insufficient documentation

## 2019-01-20 DIAGNOSIS — K8020 Calculus of gallbladder without cholecystitis without obstruction: Secondary | ICD-10-CM | POA: Diagnosis not present

## 2019-01-20 DIAGNOSIS — K8012 Calculus of gallbladder with acute and chronic cholecystitis without obstruction: Secondary | ICD-10-CM | POA: Diagnosis not present

## 2019-01-20 DIAGNOSIS — Z01818 Encounter for other preprocedural examination: Secondary | ICD-10-CM

## 2019-01-20 HISTORY — DX: Fatty (change of) liver, not elsewhere classified: K76.0

## 2019-01-20 HISTORY — DX: Family history of other specified conditions: Z84.89

## 2019-01-20 HISTORY — DX: Dyspnea, unspecified: R06.00

## 2019-01-20 HISTORY — DX: Bronchitis, not specified as acute or chronic: J40

## 2019-01-20 HISTORY — DX: Unspecified osteoarthritis, unspecified site: M19.90

## 2019-01-20 HISTORY — DX: Sleep apnea, unspecified: G47.30

## 2019-01-20 LAB — BASIC METABOLIC PANEL
Anion gap: 13 (ref 5–15)
BUN: 11 mg/dL (ref 6–20)
CO2: 23 mmol/L (ref 22–32)
Calcium: 9.5 mg/dL (ref 8.9–10.3)
Chloride: 98 mmol/L (ref 98–111)
Creatinine, Ser: 0.92 mg/dL (ref 0.61–1.24)
GFR calc Af Amer: >60 mL/min (ref >60)
GFR calc non Af Amer: >60 mL/min (ref >60)
Glucose, Bld: 277 mg/dL — ABNORMAL HIGH (ref 70–99)
Potassium: 4.3 mmol/L (ref 3.5–5.1)
Sodium: 134 mmol/L — ABNORMAL LOW (ref 135–145)

## 2019-01-20 LAB — CBC
HCT: 43.8 % (ref 39.0–52.0)
Hemoglobin: 15.1 g/dL (ref 13.0–17.0)
MCH: 30.6 pg (ref 26.0–34.0)
MCHC: 34.5 g/dL (ref 30.0–36.0)
MCV: 88.8 fL (ref 80.0–100.0)
Platelets: 214 10*3/uL (ref 150–400)
RBC: 4.93 MIL/uL (ref 4.22–5.81)
RDW: 13.2 % (ref 11.5–15.5)
WBC: 11.4 10*3/uL — ABNORMAL HIGH (ref 4.0–10.5)
nRBC: 0 % (ref 0.0–0.2)

## 2019-01-20 LAB — SARS CORONAVIRUS 2 (TAT 6-24 HRS): SARS Coronavirus 2: NEGATIVE

## 2019-01-20 NOTE — Patient Instructions (Signed)
Your procedure is scheduled on: 01-23-19 THURSDAY Report to Same Day Surgery 2nd floor medical mall [facility] Entrance-take elevator on left to 2nd floor.  Check in with surgery information desk.) To find out your arrival time please call [phone] between 1PM - 3PM on 01-22-19 [facility]  Remember: Instructions that are not followed completely may result in serious medical risk, up to and including death, or upon the discretion of your surgeon and anesthesiologist your surgery may need to be rescheduled.    _x___ 1. Do not eat food after midnight the night before your procedure. NO GUM OR CANDY AFTER MIDNIGHT. You may drink WATER up to 2 hours before you are scheduled to arrive at the hospital for your procedure.  Do not drink WATER  within 2 hours of your scheduled arrival to the hospital.  Type 1 and type 2 diabetics should only drink water.   ____Ensure clear carbohydrate drink on the way to the hospital for bariatric patients  ____Ensure clear carbohydrate drink 3 hours before surgery.     __x__ 2. No Alcohol for 24 hours before or after surgery.   __x__3. No Smoking or e-cigarettes for 24 prior to surgery.  Do not use any chewable tobacco products for at least 6 hour prior to surgery   ____  4. Bring all medications with you on the day of surgery if instructed.    __x__ 5. Notify your doctor if there is any change in your medical condition     (cold, fever, infections).    x___6. On the morning of surgery brush your teeth with toothpaste and water.  You may rinse your mouth with mouth wash if you wish.  Do not swallow any toothpaste or mouthwash.   Do not wear jewelry, make-up, hairpins, clips or nail polish.  Do not wear lotions, powders, or perfumes. You may wear deodorant.  Do not shave 48 hours prior to surgery. Men may shave face and neck.  Do not bring valuables to the hospital.    [facility] is not responsible for any belongings or valuables.    Contacts, dentures or bridgework may not be worn into surgery.  Leave your suitcase in the car. After surgery it may be brought to your room.  For patients admitted to the hospital, discharge time is determined by your treatment team.  _  Patients discharged the day of surgery will not be allowed to drive home.  You will need someone to drive you home and stay with you the night of your procedure.    Please read over the following fact sheets that you were given:   [facility] Preparing for Surgery  _x___ TAKE THE FOLLOWING MEDICATION THE MORNING OF SURGERY WITH A SMALL SIP OF WATER. These include:  1. BENTYL  2. PEPCID  3. PROTONIX  4. GABAPENTIN  5. METOPROLOL  6. SEROQUEL  ____Fleets enema or Magnesium Citrate as directed.   ____ Use CHG Soap or sage wipes as directed on instruction sheet   _X___ Use inhalers on the day of surgery and bring to hospital day of surgery-USE YOUR ALBUTEROL INHALER AM OF SURGERY AND [place]  _X___ Stop Janumet 2 days prior to surgery-LAST DOSE TODAY (Monday 01-20-19)    _X___ Take 1/2 of usual insulin dose the night before surgery and none on the morning surgery-TAKE HALF OF YOUR TOUJEO (32 UNITS) ON Wednesday NIGHT AND NO INSULIN THE MORNING OF SURGERY  _x___ Follow recommendations from Cardiologist, Pulmonologist or PCP regarding  stopping Aspirin, Coumadin, Plavix ,Eliquis, Effient, or Pradaxa, and Pletal-PT STATES HE WAS INSTRUCTED BY SURGEONS OFFICE ON 01-17-19 TO STOP ASA  X____Stop Anti-inflammatories such as Advil, Aleve, Ibuprofen, Motrin, Naproxen, Naprosyn, Goodies powders or aspirin products NOW- OK to take Tylenol    ____ Stop supplements until after surgery   _X___ Bring C-Pap to the hospital.

## 2019-01-20 NOTE — Progress Notes (Signed)
Virtual Visit via Video Note  I connected with Raymond [name] on 01/20/19 at  2:00 PM EDT by a video enabled telemedicine application and verified that I am speaking with the correct person using two identifiers.   I discussed the limitations of evaluation and management by telemedicine and the availability of in person appointments. The patient expressed understanding and agreed to proceed.    I discussed the assessment and treatment plan with the patient. The patient was provided an opportunity to ask questions and all were answered. The patient agreed with the plan and demonstrated an understanding of the instructions.   The patient was advised to call back or seek an in-person evaluation if the symptoms worsen or if the condition fails to improve as anticipated.   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  Patient today reports he is currently staying well on the current medication regimen.  He reports his mood symptoms as better.  He denies any significant mood lability, anxiety or depressive symptoms.  He reports sleep is good.  He is compliant on his medications as prescribed.  He denies any side effects.  He reports he is going to get a surgery to remove his gallbladder on Thursday.  He reports he had some work-up done prior to the surgery today.  On review of his heart rate his heart rate today was elevated.  He reports he had it done this morning for the prescreening prior to surgery.  He reports he had an EKG done.  He reports he did not take his metoprolol this morning and that may have caused it.  On review of EKG done  01/20/2019- he had short PR interval however otherwise was within normal limits.  Advised him to monitor his heart rate closely and to reach out to his PMD.  Patient denies any other concerns today. Visit Diagnosis:    ICD-10-CM   1. PTSD (post-traumatic stress disorder)  F43.10   2. MDD (major depressive disorder), recurrent episode, moderate (HCC)  F33.1   3. Insomnia due to mental disorder  F51.05     Past Psychiatric History: I have reviewed past psychiatric history from my progress note on 05/23/2018.  Past trials of Prozac, Zoloft, Celexa, Tegretol, doxepin, Klonopin, Seroquel, mirtazapine, Lunesta, Ambien  Past Medical History:  Past Medical History:  Diagnosis Date  . Anxiety   . Arthritis    NECK  . Bronchitis   . Depression   . Diabetes mellitus without complication ([place])   . 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 Dyspnea    DUE TO GALLBLADDER PER PT  . Family history of adverse reaction to anesthesia    N/V, TROUBLE BREATHING COMING OUT OF ANESTHESIA  . Fatty liver   . GERD (gastroesophageal reflux disease)   . Headache    MIGRAINES  . Hyperlipidemia   . Hypertension   . Irregular heart beat unk  . Myocardial infarction ([place]) 2010   MILD   . PTSD (post-traumatic stress disorder)   . Sleep apnea    USES CPAP  . Tachycardia     Past Surgical History:  Procedure Laterality Date  .  ingrown toenail  removal    . CLAVICLE SURGERY Right   . COLONOSCOPY  2017  . MOUTH SURGERY     REMOVED ALL TEETH  . SHOULDER SURGERY Right   . UPPER GI ENDOSCOPY  2017    Family Psychiatric History: I have reviewed family psychiatric history from my progress note on 05/23/2018  Family History:  Family History  Problem Relation Age of Onset  . Hypertension Mother   . Diabetes type II Mother   . Diabetes Mother   . COPD Father   . Cancer Father   . Heart disease Father   . Diabetes Sister   . Anxiety disorder Sister   . Depression Sister   . Diabetes Brother   .  Anxiety disorder Brother   . Depression Brother   . Diabetes Maternal Aunt   . Diabetes Sister   . Anxiety disorder Sister   . Depression Sister   . Diabetes Sister   . Anxiety disorder Sister   . Depression Sister   . Diabetes Sister   . Anxiety disorder Sister   . Depression Sister   . Colon cancer Maternal Uncle     Social History: I have reviewed social history from my progress note on 05/23/2018 Social History   Socioeconomic History  . Marital status: Divorced    Spouse name: Not on file  . Number of children: 4  . Years of education: Not on file  . Highest education level: 8th grade  Occupational History  . Not on file  Social Needs  . Financial resource strain: Very hard  . Food insecurity    Worry: Often true    Inability: Often true  . Transportation needs    Medical: No    Non-medical: No  Tobacco Use  . Smoking status: Current Every Day Smoker    Packs/day: 2.00    Years: 40.00    Pack years: 80.00    Types: Cigarettes  . Smokeless tobacco: Former Systems developer    Types: Chew  . Tobacco comment: Reports has tried multiple things to help quit and cut back.   Substance and Sexual Activity  . Alcohol use: No    Alcohol/week: 0.0 standard drinks    Frequency: Never  . Drug use: No  . Sexual activity: Not Currently  Lifestyle  . Physical activity    Days per week: 0 days    Minutes per session: 0 min  . Stress: Very much  Relationships  . Social Herbalist on phone: Not on file    Gets together: Not on file    Attends religious service: Never    Active member of club or organization: No    Attends meetings of clubs or organizations: Never    Relationship status: Divorced  Other Topics Concern  . Not on file  Social History Narrative  . Not on file    Allergies:  Allergies  Allergen Reactions  . Onion Anaphylaxis  . Hydrocodone   . Norco [Hydrocodone-Acetaminophen] Itching  . Hydrocodone-Acetaminophen Itching  . Nickel Rash     Metabolic Disorder Labs: Lab Results  Component Value Date   HGBA1C 10.1 (A) 01/16/2019   MPG 294.83 05/31/2017   No results found for: PROLACTIN Lab Results  Component Value Date   CHOL 134 07/15/2015   TRIG 227 (H) 07/15/2015   HDL 28 (L) 07/15/2015   CHOLHDL 4.8 07/15/2015   VLDL 26 12/09/2012   LDLCALC 61 07/15/2015   LDLCALC 96 12/09/2012   Lab Results  Component Value Date   TSH 4.120 06/10/2018   TSH 2.000 07/15/2015    Therapeutic Level Labs: No results found for: LITHIUM No results found for: VALPROATE No components found for:  CBMZ  Current Medications: Current Outpatient Medications  Medication Sig Dispense Refill  . ACCU-CHEK FASTCLIX LANCETS MISC yse as directed twice a day 100 each 1  . albuterol (PROAIR HFA) 108 (90 Base) MCG/ACT inhaler Inhale 2-4 puffs by mouth Every 4-6 hours as needed for wheezing, cough, and/or shortness of breath 3 Inhaler 1  . aspirin EC 81 MG tablet Take 81 mg by mouth daily at 3 pm.     . dicyclomine (BENTYL) 10 MG capsule Take 1 capsule (10 mg total) by mouth 4 (four) times daily -  before meals and at bedtime for 30 doses. 30 capsule 0  . famotidine (PEPCID) 20 MG tablet Take 1 tablet (20 mg total) by mouth 2 (two) times daily. 60 tablet 3  . gabapentin (NEURONTIN) 100 MG capsule Take 1 capsule (100 mg total) by mouth 3 (three) times daily. 90 capsule 3  . glucose blood (ACCU-CHEK AVIVA PLUS) test strip Blood sugar testing TID and as needed. E11.65 100 each 12  . Insulin Glargine, 2 Unit Dial, (TOUJEO MAX SOLOSTAR) 300 UNIT/ML SOPN Inject 60 Units into the skin daily. (Patient taking differently: Inject 64 Units into the skin at bedtime. ) 3 pen 5  . insulin lispro (HUMALOG KWIKPEN) 100 UNIT/ML KwikPen Use as directed per  sliding scale max units 8-10 per meal (Patient taking differently: Inject 12-15 Units into the skin 3 (three) times daily between meals. Sliding Scale Insulin) 15 mL 2  . metoprolol tartrate (LOPRESSOR) 100 MG  tablet Take 1 tablet (100 mg total) by mouth 2 (two) times daily. 180 tablet 0  . mirtazapine (REMERON) 45 MG tablet Take 1 tablet (45 mg total) by mouth at bedtime. 30 tablet 2  . nitroGLYCERIN (NITROSTAT) 0.4 MG SL tablet Place 0.4 mg under the tongue every 5 (five) minutes x 3 doses as needed for chest pain.     . pantoprazole (PROTONIX) 40 MG tablet Take 1 tablet (40 mg total) by mouth daily. (Patient taking differently: Take 40 mg by mouth every morning. ) 30 tablet 5  . QUEtiapine (SEROQUEL) 50 MG tablet Take 1 tablet (50 mg total) by mouth 2 (two) times daily as needed. (Patient taking differently: Take 50 mg by mouth 2 (two) times daily. ) 60 tablet 2  . sitaGLIPtin-metformin (JANUMET) 50-500 MG tablet Take 1 tablet by mouth 2 (two) times daily. 180 tablet 1  . traZODone (DESYREL) 100 MG tablet Take 2.5 tablets (250 mg total) by mouth at bedtime. 75 tablet 2   No current facility-administered medications for this visit.      Musculoskeletal: Strength & Muscle Tone: UTA Gait & Station: WNL Patient leans: N/A  Psychiatric Specialty Exam: Review of Systems  Psychiatric/Behavioral: Negative for hallucinations and substance abuse. The patient is not nervous/anxious.   All other systems reviewed and are negative.   There were no vitals taken for this visit.There is no height or weight on file to calculate BMI.  General Appearance: Casual  Eye Contact:  Fair  Speech:  Normal Rate  Volume:  Normal  Mood:  Euthymic  Affect:  Appropriate  Thought Process:  Goal Directed and Descriptions of Associations: Intact  Orientation:  Full (Time, Place, and Person)  Thought Content: Logical   Suicidal Thoughts:  No  Homicidal Thoughts:  No  Memory:  Immediate;   Fair Recent;   Fair Remote;   Fair  Judgement:  Fair  Insight:  Fair  Psychomotor Activity:  Normal  Concentration:  Concentration: Fair and Attention Span: Fair  Recall:  [employer] of Knowledge: Fair  Language: Fair   Akathisia:  No  Handed:  Right  AIMS (if indicated): Denies tremors, rigidity  Assets:  Communication Skills Desire for Improvement Social Support  ADL's:  Intact  Cognition: WNL  Sleep:  Fair   Screenings: Mini-Mental     Clinical Support from 10/08/2018 in [facility], [place] from 10/05/2017 in [facility], [facility]  Total Score (max 30 points )  27  30    PHQ2-9     Office Visit from 01/16/2019 in [facility], [facility] Office Visit from 11/15/2018 in [facility], [place] from 10/08/2018 in [facility], [facility] Office Visit from 07/04/2018 in [facility], [facility] Office Visit from 04/01/2018 in [facility], [facility]  PHQ-2 Total Score  0  0  0  0  0       Assessment and Plan: Maahir is a 53 year old Caucasian male, disability, lives in [place], divorced, has a history of PTSD, MDD, insomnia, uncontrolled diabetes melitis, 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  Patient is biologically predisposed given his multiple medical problems, history of trauma.  Patient also has psychosocial stressors of COVID-19 outbreak, his own health problems are direct of his nephew.  He also has upcoming surgery.  Patient is currently stable on medications.  Plan MDD- stable Mirtazapine 45 mg p.o. nightly Seroquel 50 mg p.o. twice daily PRN for mood lability Patient to continue psychotherapy sessions with Ms. Cecilie Lowers  PTSD-improving Continue CBT  Insomnia-stable Trazodone to 50 mg p.o. nightly Continue CPAP for OSA  I have reviewed EKG in [facility] dated 01/20/2019-QTC within normal limits, short PR interval.  Patient advised to continue to work with his primary care provider.  Follow-up in clinic in 1 to 2 months or sooner if needed.  November 18 at 2 PM  I have spent atleast 15 minutes non face to face with patient today. More than 50 % of the time was spent  for psychoeducation and supportive psychotherapy and care coordination. This note was generated in part or whole with voice recognition software. Voice recognition is usually quite accurate but there are transcription errors that can and very often do occur. I apologize for any typographical errors that were not detected and corrected.     Ursula Alert, MD 01/20/2019, 2:51 PM

## 2019-01-20 NOTE — Progress Notes (Signed)
[facility] [place], [place] [address]  Internal MEDICINE  Office Visit Note  Patient Name: Raymond [name]  [id number]  [id number]  Date of Service: 01/20/2019  Chief Complaint  Patient presents with  . Medical Clearance    The patient is here to have surgical clearance. He is scheduled to have removal of his gallbladder on Thursday, January 23, 2019. He had ECG this morning. He is slightly tachycardic, but otherwise, this looks good. Blood sugars have been running higher than usual. Had HgbA1c was checked last week and was 10.1. he states that he has not been using his sliding scale insulin as he should, as his stomach has been hurting so bad he has not been eating normally. Heart rate is elevated today as he was up early this morning and did not take normal morning medications. In addition to belly pain, he has some lower back pain. This is worse with exertion. He did find out he has a bulging disc in the lumbar spine. He states that besides belly and back he feels pretty good.       Current Medication: Outpatient Encounter Medications as of 01/20/2019  Medication Sig  . ACCU-CHEK FASTCLIX LANCETS MISC yse as directed twice a day  . albuterol (PROAIR HFA) 108 (90 Base) MCG/ACT inhaler Inhale 2-4 puffs by mouth Every 4-6 hours as needed for wheezing, cough, and/or shortness of breath  . aspirin EC 81 MG tablet Take 81 mg by mouth daily at 3 pm.   . dicyclomine (BENTYL) 10 MG capsule Take 1 capsule (10 mg total) by mouth 4 (four) times daily -  before meals and at bedtime for 30 doses.  . famotidine (PEPCID) 20 MG tablet Take 1 tablet (20 mg total) by mouth 2 (two) times daily.  Marland Kitchen gabapentin (NEURONTIN) 100 MG capsule Take 1 capsule (100 mg total) by mouth 3 (three) times daily.  Marland Kitchen glucose blood (ACCU-CHEK AVIVA PLUS) test strip Blood sugar testing TID and as needed. E11.65  . Insulin Glargine, 2 Unit Dial, (TOUJEO MAX SOLOSTAR) 300 UNIT/ML SOPN Inject 60  Units into the skin daily. (Patient taking differently: Inject 64 Units into the skin at bedtime. )  . insulin lispro (HUMALOG KWIKPEN) 100 UNIT/ML KwikPen Use as directed per  sliding scale max units 8-10 per meal (Patient taking differently: Inject 12-15 Units into the skin 3 (three) times daily between meals. Sliding Scale Insulin)  . metoprolol tartrate (LOPRESSOR) 100 MG tablet Take 1 tablet (100 mg total) by mouth 2 (two) times daily.  . mirtazapine (REMERON) 45 MG tablet Take 1 tablet (45 mg total) by mouth at bedtime.  . nitroGLYCERIN (NITROSTAT) 0.4 MG SL tablet Place 0.4 mg under the tongue every 5 (five) minutes x 3 doses as needed for chest pain.   . pantoprazole (PROTONIX) 40 MG tablet Take 1 tablet (40 mg total) by mouth daily.  . QUEtiapine (SEROQUEL) 50 MG tablet Take 1 tablet (50 mg total) by mouth 2 (two) times daily as needed. (Patient taking differently: Take 50 mg by mouth 2 (two) times daily. )  . sitaGLIPtin-metformin (JANUMET) 50-500 MG tablet Take 1 tablet by mouth 2 (two) times daily.  . traZODone (DESYREL) 100 MG tablet Take 2.5 tablets (250 mg total) by mouth at bedtime.  . [DISCONTINUED] gentamicin cream (GARAMYCIN) 0.1 % Apply 1 application topically 2 (two) times daily. (Patient not taking: Reported on 01/17/2019)  . [DISCONTINUED] hydrOXYzine (VISTARIL) 50 MG capsule Take 1 capsule (50 mg total) by  mouth at bedtime as needed. FOR SLEEP (Patient not taking: Reported on 01/17/2019)   No facility-administered encounter medications on file as of 01/20/2019.     Surgical History: Past Surgical History:  Procedure Laterality Date  .  ingrown toenail removal    . CLAVICLE SURGERY Right   . COLONOSCOPY  2017  . SHOULDER SURGERY Right   . UPPER GI ENDOSCOPY  2017    Medical History: Past Medical History:  Diagnosis Date  . Anxiety   . Depression   . Diabetes mellitus without complication ([place])   . 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 GERD (gastroesophageal  reflux disease)   . Headache   . Hyperlipidemia   . Hypertension   . Irregular heart beat unk  . Myocardial infarction ([place])   . PTSD (post-traumatic stress disorder)   . Tachycardia     Family History: Family History  Problem Relation Age of Onset  . Hypertension Mother   . Diabetes type II Mother   . Diabetes Mother   . COPD Father   . Cancer Father   . Heart disease Father   . Diabetes Sister   . Anxiety disorder Sister   . Depression Sister   . Diabetes Brother   . Anxiety disorder Brother   . Depression Brother   . Diabetes Maternal Aunt   . Diabetes Sister   . Anxiety disorder Sister   . Depression Sister   . Diabetes Sister   . Anxiety disorder Sister   . Depression Sister   . Diabetes Sister   . Anxiety disorder Sister   . Depression Sister   . Colon cancer Maternal Uncle     Social History   Socioeconomic History  . Marital status: Divorced    Spouse name: Not on file  . Number of children: 4  . Years of education: Not on file  . Highest education level: 8th grade  Occupational History  . Not on file  Social Needs  . Financial resource strain: Very hard  . Food insecurity    Worry: Often true    Inability: Often true  . Transportation needs    Medical: No    Non-medical: No  Tobacco Use  . Smoking status: Current Every Day Smoker    Packs/day: 2.00    Years: 40.00    Pack years: 80.00    Types: Cigarettes  . Smokeless tobacco: Former Systems developer    Types: Chew  . Tobacco comment: Reports has tried multiple things to help quit and cut back.   Substance and Sexual Activity  . Alcohol use: No    Alcohol/week: 0.0 standard drinks    Frequency: Never  . Drug use: No  . Sexual activity: Not Currently  Lifestyle  . Physical activity    Days per week: 0 days    Minutes per session: 0 min  . Stress: Very much  Relationships  . Social Herbalist on phone: Not on file    Gets together: Not on file    Attends religious service: Never     Active member of club or organization: No    Attends meetings of clubs or organizations: Never    Relationship status: Divorced  . Intimate partner violence    Fear of current or ex partner: No    Emotionally abused: No    Physically abused: No    Forced sexual activity: No  Other Topics Concern  . Not on file  Social History Narrative  .  Not on file      Review of Systems  Constitutional: Positive for fatigue. Negative for activity change, chills and unexpected weight change.  HENT: Negative for congestion, postnasal drip, rhinorrhea, sneezing and sore throat.   Respiratory: Negative for cough, chest tightness, shortness of breath and wheezing.   Cardiovascular: Negative for chest pain and palpitations.  Gastrointestinal: Positive for abdominal pain, diarrhea and nausea. Negative for constipation and vomiting.       Patient scheduled to have gallbladder removal on 01/23/2019  Endocrine: Negative for cold intolerance, heat intolerance, polydipsia and polyuria.       Blood sugars elevated. Has not been using sliding scale insulin as prescribed due to decreased appetite. HgbA1c checked 01/16/2019 and was 10.1  Musculoskeletal: Positive for back pain and myalgias. Negative for arthralgias, joint swelling and neck pain.  Skin: Negative for rash.  Allergic/Immunologic: Positive for environmental allergies.  Neurological: Negative for dizziness, tremors, numbness and headaches.  Hematological: Negative for adenopathy. Does not bruise/bleed easily.  Psychiatric/Behavioral: Negative for behavioral problems (Depression), sleep disturbance and suicidal ideas. The patient is nervous/anxious.     Today's Vitals   01/20/19 0855  BP: (!) 146/97  Pulse: (!) 116  Resp: 16  Temp: 98.2 F (36.8 C)  SpO2: 96%  Weight: 248 lb (112.5 kg)  Height: 5\' 9"  (1.753 m)   Body mass index is 36.62 kg/m.  Physical Exam Vitals signs and nursing note reviewed.  Constitutional:      General: He is not  in acute distress.    Appearance: Normal appearance. He is well-developed. He is obese. He is not diaphoretic.  HENT:     Head: Normocephalic and atraumatic.     Mouth/Throat:     Pharynx: No oropharyngeal exudate.  Eyes:     Pupils: Pupils are equal, round, and reactive to light.  Neck:     Musculoskeletal: Normal range of motion and neck supple.     Thyroid: No thyromegaly.     Vascular: No JVD.     Trachea: No tracheal deviation.  Cardiovascular:     Rate and Rhythm: Regular rhythm. Tachycardia present.     Heart sounds: Normal heart sounds. No murmur. No friction rub. No gallop.      Comments: ECG showing sinus tachycardia but is otherwise without abnormality.  Pulmonary:     Effort: Pulmonary effort is normal. No respiratory distress.     Breath sounds: Normal breath sounds. No wheezing or rales.  Chest:     Chest wall: No tenderness.  Abdominal:     General: Bowel sounds are normal.     Palpations: Abdomen is soft.     Tenderness: There is abdominal tenderness.     Comments: Generalized abdominal tenderness.   Musculoskeletal: Normal range of motion.  Lymphadenopathy:     Cervical: No cervical adenopathy.  Skin:    General: Skin is warm and dry.  Neurological:     Mental Status: He is alert and oriented to person, place, and time.     Cranial Nerves: No cranial nerve deficit.  Psychiatric:        Behavior: Behavior normal.        Thought Content: Thought content normal.        Judgment: Judgment normal.   Assessment/Plan: 1. Calculus of gallbladder with acute on chronic cholecystitis without obstruction Patient scheduled to have gallbladder removed on 01/23/2019.  2. Encounter for other preprocedural examination Surgical clearance performed today. He is at moderate risk due to uncontrolled type  2 diabetes for which he is insulin dependant.  - EKG 12-Lead showing sinus tachycardia but otherwise, no abnormalities.  BMP and CBC were ordered today and will be attached  to this note and sent to [facility] once results are available.   3. Uncontrolled type 2 diabetes mellitus with hyperglycemia ([place]) Patient advised about strict blood sugar control with mealtime coverage with sliding scale insulin. He is working harder on this. Will recheck at next visit. Better control of blood sugars will allow for optimal healing after surgery.   4. Essential hypertension Generally stable. Continue medication as prescribed   General Counseling: Raymond [name] verbalizes understanding of the findings of todays visit and agrees with plan of treatment. I have discussed any further diagnostic evaluation that may be needed or ordered today. We also reviewed his medications today. he has been encouraged to call the office with any questions or concerns that should arise related to todays visit.  Diabetes Counseling:  1. Addition of ACE inh/ ARB'S for nephroprotection. Microalbumin is updated  2. Diabetic foot care, prevention of complications. Podiatry consult 3. Exercise and lose weight.  4. Diabetic eye examination, Diabetic eye exam is updated  5. Monitor blood sugar closlely. nutrition counseling.  6. Sign and symptoms of hypoglycemia including shaking sweating,confusion and headaches.  This patient was seen by Leretha Pol FNP Collaboration with Dr Lavera Guise as a part of collaborative care agreement   Orders Placed This Encounter  Procedures  . EKG 12-Lead     Time spent: 77 Minutes      Dr Lavera Guise Internal medicine

## 2019-01-20 NOTE — Telephone Encounter (Signed)
Pt has been advised of pre admission date/time, Covid Testing date and Surgery date.  Surgery Date: 01/23/19 With Dr Piscoya-laparoscopic cholecystectomy. Preadmission Testing Date: 01/20/19 @ 11:00am-office interview.  Covid Testing Date: 01/20/19 following preadmission appointment - patient advised to go to the [facility] ([place])  [employer] Video sent via [facility] and [employer].  Patient has been made aware to call [phone], between 1-3:00pm the day before surgery, to find out what time to arrive.

## 2019-01-20 NOTE — Progress Notes (Signed)
Printed out and attached to progress note and surgical clearance and sent to [facility].

## 2019-01-21 ENCOUNTER — Telehealth: Payer: Self-pay

## 2019-01-21 NOTE — Telephone Encounter (Signed)
Medical Clearance is obtained from [facility] -per Nira Conn the patient is at moderate risk due to uncontrolled type 2 diabetes-insulin dependant.

## 2019-01-23 ENCOUNTER — Encounter
Admission: RE | Disposition: A | Discharge status: Home / Self Care | Payer: Self-pay | Source: Home / Self Care | Attending: Surgery

## 2019-01-23 ENCOUNTER — Other Ambulatory Visit: Payer: Self-pay

## 2019-01-23 ENCOUNTER — Ambulatory Visit: Payer: Medicare Other | Admitting: Certified Registered"

## 2019-01-23 ENCOUNTER — Encounter: Payer: Self-pay | Admitting: *Deleted

## 2019-01-23 ENCOUNTER — Ambulatory Visit
Admission: RE | Admit: 2019-01-23 | Discharge: 2019-01-23 | Disposition: A | Discharge status: Home / Self Care | Payer: Medicare Other | Attending: Surgery | Admitting: Surgery

## 2019-01-23 ENCOUNTER — Ambulatory Visit: Payer: Medicare Other | Admitting: Nurse Practitioner

## 2019-01-23 DIAGNOSIS — F329 Major depressive disorder, single episode, unspecified: Secondary | ICD-10-CM | POA: Diagnosis not present

## 2019-01-23 DIAGNOSIS — R1011 Right upper quadrant pain: Secondary | ICD-10-CM | POA: Diagnosis not present

## 2019-01-23 DIAGNOSIS — G4730 Sleep apnea, unspecified: Secondary | ICD-10-CM | POA: Diagnosis not present

## 2019-01-23 DIAGNOSIS — E119 Type 2 diabetes mellitus without complications: Secondary | ICD-10-CM | POA: Diagnosis not present

## 2019-01-23 DIAGNOSIS — K8010 Calculus of gallbladder with chronic cholecystitis without obstruction: Secondary | ICD-10-CM | POA: Insufficient documentation

## 2019-01-23 DIAGNOSIS — F4310 Post-traumatic stress disorder, unspecified: Secondary | ICD-10-CM | POA: Diagnosis not present

## 2019-01-23 DIAGNOSIS — I10 Essential (primary) hypertension: Secondary | ICD-10-CM | POA: Insufficient documentation

## 2019-01-23 DIAGNOSIS — Z7982 Long term (current) use of aspirin: Secondary | ICD-10-CM | POA: Diagnosis not present

## 2019-01-23 DIAGNOSIS — M1990 Unspecified osteoarthritis, unspecified site: Secondary | ICD-10-CM | POA: Diagnosis not present

## 2019-01-23 DIAGNOSIS — F17200 Nicotine dependence, unspecified, uncomplicated: Secondary | ICD-10-CM | POA: Insufficient documentation

## 2019-01-23 DIAGNOSIS — Z79899 Other long term (current) drug therapy: Secondary | ICD-10-CM | POA: Diagnosis not present

## 2019-01-23 DIAGNOSIS — K219 Gastro-esophageal reflux disease without esophagitis: Secondary | ICD-10-CM | POA: Diagnosis not present

## 2019-01-23 DIAGNOSIS — K811 Chronic cholecystitis: Secondary | ICD-10-CM | POA: Diagnosis present

## 2019-01-23 DIAGNOSIS — F419 Anxiety disorder, unspecified: Secondary | ICD-10-CM | POA: Diagnosis not present

## 2019-01-23 DIAGNOSIS — E785 Hyperlipidemia, unspecified: Secondary | ICD-10-CM | POA: Diagnosis not present

## 2019-01-23 DIAGNOSIS — Z794 Long term (current) use of insulin: Secondary | ICD-10-CM | POA: Diagnosis not present

## 2019-01-23 DIAGNOSIS — G4733 Obstructive sleep apnea (adult) (pediatric): Secondary | ICD-10-CM | POA: Diagnosis not present

## 2019-01-23 DIAGNOSIS — E1165 Type 2 diabetes mellitus with hyperglycemia: Secondary | ICD-10-CM | POA: Diagnosis not present

## 2019-01-23 DIAGNOSIS — K8012 Calculus of gallbladder with acute and chronic cholecystitis without obstruction: Secondary | ICD-10-CM | POA: Diagnosis not present

## 2019-01-23 DIAGNOSIS — I252 Old myocardial infarction: Secondary | ICD-10-CM | POA: Diagnosis not present

## 2019-01-23 HISTORY — PX: CHOLECYSTECTOMY: SHX55

## 2019-01-23 LAB — GLUCOSE, CAPILLARY
Glucose-Capillary: 262 mg/dL — ABNORMAL HIGH (ref 70–99)
Glucose-Capillary: 291 mg/dL — ABNORMAL HIGH (ref 70–99)

## 2019-01-23 SURGERY — LAPAROSCOPIC CHOLECYSTECTOMY
Anesthesia: General

## 2019-01-23 MED ORDER — CEFAZOLIN SODIUM-DEXTROSE 2-4 GM/100ML-% IV SOLN
2.0000 g | INTRAVENOUS | Status: AC
  Administered 2019-01-23: 2 g via INTRAVENOUS

## 2019-01-23 MED ORDER — FENTANYL CITRATE (PF) 100 MCG/2ML IJ SOLN
INTRAMUSCULAR | Status: AC
  Filled 2019-01-23: qty 2

## 2019-01-23 MED ORDER — SUCCINYLCHOLINE CHLORIDE 20 MG/ML IJ SOLN
INTRAMUSCULAR | Status: DC | PRN
  Administered 2019-01-23: 140 mg via INTRAVENOUS

## 2019-01-23 MED ORDER — CHLORHEXIDINE GLUCONATE CLOTH 2 % EX PADS: 6.0000 | MEDICATED_PAD | Freq: Once | CUTANEOUS | Status: DC

## 2019-01-23 MED ORDER — ROCURONIUM BROMIDE 100 MG/10ML IV SOLN
INTRAVENOUS | Status: DC | PRN
  Administered 2019-01-23 (×2): 10 mg via INTRAVENOUS
  Administered 2019-01-23: 50 mg via INTRAVENOUS
  Administered 2019-01-23: 10 mg via INTRAVENOUS

## 2019-01-23 MED ORDER — GABAPENTIN 300 MG PO CAPS: 300.0000 mg | ORAL_CAPSULE | ORAL | Status: DC

## 2019-01-23 MED ORDER — MIDAZOLAM HCL 2 MG/2ML IJ SOLN
INTRAMUSCULAR | Status: AC
  Filled 2019-01-23: qty 2

## 2019-01-23 MED ORDER — ONDANSETRON HCL 4 MG/2ML IJ SOLN
INTRAMUSCULAR | Status: DC | PRN
  Administered 2019-01-23 (×2): 4 mg via INTRAVENOUS

## 2019-01-23 MED ORDER — ACETAMINOPHEN 500 MG PO TABS
1000.0000 mg | ORAL_TABLET | ORAL | Status: AC
  Administered 2019-01-23: 13:00:00 1000 mg via ORAL

## 2019-01-23 MED ORDER — ACETAMINOPHEN 160 MG/5ML PO SOLN: 325.0000 mg | ORAL | Status: DC | PRN

## 2019-01-23 MED ORDER — DEXAMETHASONE SODIUM PHOSPHATE 10 MG/ML IJ SOLN
INTRAMUSCULAR | Status: DC | PRN
  Administered 2019-01-23: 10 mg via INTRAVENOUS

## 2019-01-23 MED ORDER — ACETAMINOPHEN 500 MG PO TABS
ORAL_TABLET | ORAL | Status: AC
  Administered 2019-01-23: 13:00:00 1000 mg via ORAL
  Filled 2019-01-23: qty 2

## 2019-01-23 MED ORDER — INSULIN ASPART 100 UNIT/ML ~~LOC~~ SOLN
SUBCUTANEOUS | Status: AC
  Filled 2019-01-23: qty 1

## 2019-01-23 MED ORDER — CEFAZOLIN SODIUM-DEXTROSE 2-4 GM/100ML-% IV SOLN
INTRAVENOUS | Status: AC
  Filled 2019-01-23: qty 100

## 2019-01-23 MED ORDER — MIDAZOLAM HCL 2 MG/2ML IJ SOLN
INTRAMUSCULAR | Status: DC | PRN
  Administered 2019-01-23: 2 mg via INTRAVENOUS

## 2019-01-23 MED ORDER — GLYCOPYRROLATE 0.2 MG/ML IJ SOLN
INTRAMUSCULAR | Status: DC | PRN
  Administered 2019-01-23: 0.2 mg via INTRAVENOUS

## 2019-01-23 MED ORDER — METOPROLOL TARTRATE 5 MG/5ML IV SOLN
INTRAVENOUS | Status: DC | PRN
  Administered 2019-01-23: 1 mg via INTRAVENOUS
  Administered 2019-01-23: 2 mg via INTRAVENOUS
  Administered 2019-01-23: 1 mg via INTRAVENOUS

## 2019-01-23 MED ORDER — BUPIVACAINE-EPINEPHRINE (PF) 0.25% -1:200000 IJ SOLN
INTRAMUSCULAR | Status: DC | PRN
  Administered 2019-01-23: 30 mL

## 2019-01-23 MED ORDER — EPINEPHRINE PF 1 MG/ML IJ SOLN
INTRAMUSCULAR | Status: AC
  Filled 2019-01-23: qty 1

## 2019-01-23 MED ORDER — PROPOFOL 10 MG/ML IV BOLUS
INTRAVENOUS | Status: DC | PRN
  Administered 2019-01-23: 200 mg via INTRAVENOUS

## 2019-01-23 MED ORDER — FENTANYL CITRATE (PF) 100 MCG/2ML IJ SOLN
INTRAMUSCULAR | Status: DC | PRN
  Administered 2019-01-23 (×2): 25 ug via INTRAVENOUS
  Administered 2019-01-23 (×3): 50 ug via INTRAVENOUS

## 2019-01-23 MED ORDER — PROMETHAZINE HCL 25 MG/ML IJ SOLN: 6.2500 mg | INTRAMUSCULAR | Status: DC | PRN

## 2019-01-23 MED ORDER — IBUPROFEN 600 MG PO TABS: 600.0000 mg | ORAL_TABLET | Freq: Three times a day (TID) | ORAL | 0 refills | Status: DC | PRN

## 2019-01-23 MED ORDER — LIDOCAINE HCL (CARDIAC) PF 100 MG/5ML IV SOSY
PREFILLED_SYRINGE | INTRAVENOUS | Status: DC | PRN
  Administered 2019-01-23: 100 mg via INTRAVENOUS

## 2019-01-23 MED ORDER — DEXMEDETOMIDINE HCL IN NACL 200 MCG/50ML IV SOLN
INTRAVENOUS | Status: DC | PRN
  Administered 2019-01-23 (×2): 8 ug via INTRAVENOUS

## 2019-01-23 MED ORDER — FENTANYL CITRATE (PF) 100 MCG/2ML IJ SOLN
25.0000 ug | INTRAMUSCULAR | Status: DC | PRN
  Administered 2019-01-23 (×4): 25 ug via INTRAVENOUS

## 2019-01-23 MED ORDER — BUPIVACAINE HCL (PF) 0.25 % IJ SOLN
INTRAMUSCULAR | Status: AC
  Filled 2019-01-23: qty 30

## 2019-01-23 MED ORDER — SODIUM CHLORIDE 0.9 % IV SOLN
INTRAVENOUS | Status: DC
  Administered 2019-01-23 (×3): via INTRAVENOUS

## 2019-01-23 MED ORDER — GABAPENTIN 300 MG PO CAPS
ORAL_CAPSULE | ORAL | Status: AC
  Filled 2019-01-23: qty 1

## 2019-01-23 MED ORDER — MEPERIDINE HCL 50 MG/ML IJ SOLN: 6.2500 mg | INTRAMUSCULAR | Status: DC | PRN

## 2019-01-23 MED ORDER — FENTANYL CITRATE (PF) 100 MCG/2ML IJ SOLN
INTRAMUSCULAR | Status: AC
  Administered 2019-01-23: 17:00:00 25 ug via INTRAVENOUS
  Filled 2019-01-23: qty 2

## 2019-01-23 MED ORDER — ESMOLOL HCL 100 MG/10ML IV SOLN
INTRAVENOUS | Status: DC | PRN
  Administered 2019-01-23: 20 mg via INTRAVENOUS
  Administered 2019-01-23: 10 mg via INTRAVENOUS

## 2019-01-23 MED ORDER — INSULIN ASPART 100 UNIT/ML ~~LOC~~ SOLN
8.0000 [IU] | Freq: Once | SUBCUTANEOUS | Status: AC
  Administered 2019-01-23: 16:00:00 8 [IU] via SUBCUTANEOUS

## 2019-01-23 MED ORDER — OXYCODONE HCL 5 MG PO TABS: 5.0000 mg | ORAL_TABLET | ORAL | 0 refills | Status: DC | PRN

## 2019-01-23 MED ORDER — ACETAMINOPHEN 325 MG PO TABS: 325.0000 mg | ORAL_TABLET | ORAL | Status: DC | PRN

## 2019-01-23 MED ORDER — SUGAMMADEX SODIUM 500 MG/5ML IV SOLN
INTRAVENOUS | Status: DC | PRN
  Administered 2019-01-23: 300 mg via INTRAVENOUS

## 2019-01-23 SURGICAL SUPPLY — 46 items
APPLIER CLIP 5 13 M/L LIGAMAX5 (MISCELLANEOUS) ×3
BLADE SURG 15 STRL LF DISP TIS (BLADE) ×1 IMPLANT
BLADE SURG 15 STRL SS (BLADE) ×2
CANISTER SUCT 1200ML W/VALVE (MISCELLANEOUS) ×3 IMPLANT
CATH CHOLANGI 4FR 420404F (CATHETERS) IMPLANT
CHLORAPREP W/TINT 26 (MISCELLANEOUS) ×3 IMPLANT
CLIP APPLIE 5 13 M/L LIGAMAX5 (MISCELLANEOUS) ×1 IMPLANT
CONRAY 60ML FOR OR (MISCELLANEOUS) ×3 IMPLANT
COVER WAND RF STERILE (DRAPES) ×3 IMPLANT
DERMABOND ADVANCED (GAUZE/BANDAGES/DRESSINGS) ×2
DERMABOND ADVANCED .7 DNX12 (GAUZE/BANDAGES/DRESSINGS) ×1 IMPLANT
DRAPE C-ARM XRAY 36X54 (DRAPES) IMPLANT
ELECT CAUTERY BLADE TIP 2.5 (TIP) ×3
ELECT REM PT RETURN 9FT ADLT (ELECTROSURGICAL) ×3
ELECTRODE CAUTERY BLDE TIP 2.5 (TIP) ×1 IMPLANT
ELECTRODE REM PT RTRN 9FT ADLT (ELECTROSURGICAL) ×1 IMPLANT
GLOVE SURG SYN 7.0 (GLOVE) ×3 IMPLANT
GLOVE SURG SYN 7.5  E (GLOVE) ×2
GLOVE SURG SYN 7.5 E (GLOVE) ×1 IMPLANT
GOWN STRL REUS W/ TWL LRG LVL3 (GOWN DISPOSABLE) ×4 IMPLANT
GOWN STRL REUS W/TWL LRG LVL3 (GOWN DISPOSABLE) ×8
IRRIGATION STRYKERFLOW (MISCELLANEOUS) ×1 IMPLANT
IRRIGATOR STRYKERFLOW (MISCELLANEOUS) ×3
IV CATH ANGIO 12GX3 LT BLUE (NEEDLE) ×3 IMPLANT
IV NS 1000ML (IV SOLUTION) ×2
IV NS 1000ML BAXH (IV SOLUTION) ×1 IMPLANT
JACKSON PRATT 10 (INSTRUMENTS) IMPLANT
L-HOOK LAP DISP 36CM (ELECTROSURGICAL) ×3
LABEL OR SOLS (LABEL) IMPLANT
LHOOK LAP DISP 36CM (ELECTROSURGICAL) ×1 IMPLANT
NEEDLE HYPO 22GX1.5 SAFETY (NEEDLE) ×6 IMPLANT
PACK LAP CHOLECYSTECTOMY (MISCELLANEOUS) ×3 IMPLANT
PENCIL ELECTRO HAND CTR (MISCELLANEOUS) ×3 IMPLANT
POUCH SPECIMEN RETRIEVAL 10MM (ENDOMECHANICALS) ×3 IMPLANT
SCISSORS METZENBAUM CVD 33 (INSTRUMENTS) ×3 IMPLANT
SET TUBE SMOKE EVAC HIGH FLOW (TUBING) ×3 IMPLANT
SLEEVE ADV FIXATION 5X100MM (TROCAR) ×9 IMPLANT
SPONGE VERSALON 4X4 4PLY (MISCELLANEOUS) IMPLANT
SUT MNCRL 4-0 (SUTURE) ×4
SUT MNCRL 4-0 27XMFL (SUTURE) ×2
SUT VIC AB 3-0 SH 27 (SUTURE) ×2
SUT VIC AB 3-0 SH 27X BRD (SUTURE) ×1 IMPLANT
SUT VICRYL 0 AB UR-6 (SUTURE) ×3 IMPLANT
SUTURE MNCRL 4-0 27XMF (SUTURE) ×2 IMPLANT
TROCAR BALLN GELPORT 12X130M (ENDOMECHANICALS) ×3 IMPLANT
TROCAR Z-THREAD OPTICAL 5X100M (TROCAR) ×3 IMPLANT

## 2019-01-23 NOTE — Anesthesia Procedure Notes (Signed)
Procedure Name: Intubation Performed by: Kelton Pillar, CRNA Pre-anesthesia Checklist: Patient identified, Emergency Drugs available, Suction available and Patient being monitored Patient Re-evaluated:Patient Re-evaluated prior to induction Oxygen Delivery Method: Circle system utilized Preoxygenation: Pre-oxygenation with 100% oxygen Induction Type: IV induction and Rapid sequence Laryngoscope Size: McGraph and 3 Grade View: Grade I Tube type: Oral Tube size: 7.5 mm Number of attempts: 1 Airway Equipment and Method: Stylet Placement Confirmation: ETT inserted through vocal cords under direct vision,  positive ETCO2,  CO2 detector and breath sounds checked- equal and bilateral Secured at: 21 cm Tube secured with: Tape Dental Injury: Teeth and Oropharynx as per pre-operative assessment

## 2019-01-23 NOTE — Progress Notes (Signed)
Per Dr. Ronelle Nigh, 8 units novolog insulin given for blood sugar of 291. Report off to [facility], RN, in post op who verbalized understanding to recheck BS prior to discharge.

## 2019-01-23 NOTE — Anesthesia Preprocedure Evaluation (Addendum)
Anesthesia Evaluation  Patient identified by MRN, date of birth, ID band Patient awake    Reviewed: Allergy & Precautions, H&P , NPO status , reviewed documented beta blocker date and time   History of Anesthesia Complications (+) Family history of anesthesia reaction  Airway Mallampati: III  TM Distance: >3 FB Neck ROM: full    Dental  (+) Edentulous Upper, Edentulous Lower, Upper Dentures, Lower Dentures   Pulmonary shortness of breath, sleep apnea , Current Smoker and Patient abstained from smoking.,    Pulmonary exam normal        Cardiovascular hypertension, + Past MI  Normal cardiovascular exam     Neuro/Psych  Headaches, PSYCHIATRIC DISORDERS Anxiety Depression  Neuromuscular disease    GI/Hepatic GERD  Medicated and Poorly Controlled,  Endo/Other  diabetes  Renal/GU      Musculoskeletal  (+) Arthritis ,   Abdominal   Peds  Hematology   Anesthesia Other Findings Past Medical History: No date: Anxiety No date: Arthritis     Comment:  NECK No date: Bronchitis No date: Depression No date: Diabetes mellitus without complication ([place]) [id number]: Diastasis of rectus abdominis No date: Dyspnea     Comment:  DUE TO GALLBLADDER PER PT No date: Family history of adverse reaction to anesthesia     Comment:  N/V, TROUBLE BREATHING COMING OUT OF ANESTHESIA No date: Fatty liver No date: GERD (gastroesophageal reflux disease) No date: Headache     Comment:  MIGRAINES No date: Hyperlipidemia No date: Hypertension unk: Irregular heart beat 2010: Myocardial infarction ([place])     Comment:  MILD  No date: PTSD (post-traumatic stress disorder) No date: Sleep apnea     Comment:  USES CPAP No date: Tachycardia Past Surgical History: No date:  ingrown toenail removal No date: CLAVICLE SURGERY; Right 2017: COLONOSCOPY No date: MOUTH SURGERY     Comment:  REMOVED ALL TEETH No date: SHOULDER SURGERY; Right 2017:  UPPER GI ENDOSCOPY BMI    Body Mass Index: 36.79 kg/m     Reproductive/Obstetrics                             Anesthesia Physical Anesthesia Plan  ASA: III  Anesthesia Plan: General   Post-op Pain Management:    Induction: Intravenous, Rapid sequence and Cricoid pressure planned  PONV Risk Score and Plan: 2 and Midazolam, Ondansetron and Dexamethasone  Airway Management Planned: Oral ETT  Additional Equipment:   Intra-op Plan:   Post-operative Plan: Extubation in OR  Informed Consent: I have reviewed the patients History and Physical, chart, labs and discussed the procedure including the risks, benefits and alternatives for the proposed anesthesia with the patient or authorized representative who has indicated his/her understanding and acceptance.     Dental Advisory Given  Plan Discussed with: CRNA  Anesthesia Plan Comments:         Anesthesia Quick Evaluation

## 2019-01-23 NOTE — Anesthesia Postprocedure Evaluation (Signed)
Anesthesia Post Note  Patient: Raymond [name]  Procedure(s) Performed: LAPAROSCOPIC CHOLECYSTECTOMY (N/A )  Patient location during evaluation: PACU Anesthesia Type: General Level of consciousness: awake and alert Pain management: pain level controlled Vital Signs Assessment: post-procedure vital signs reviewed and stable Respiratory status: spontaneous breathing and respiratory function stable Cardiovascular status: stable Anesthetic complications: no     Last Vitals:  Vitals:   01/23/19 1657 01/23/19 1709  BP:  127/75  Pulse: 99 91  Resp: 14 18  Temp: 36.8 C 36.7 C  SpO2: 96% 92%    Last Pain:  Vitals:   01/23/19 1709  TempSrc: Temporal  PainSc: 3                  [name]

## 2019-01-23 NOTE — Transfer of Care (Signed)
Immediate Anesthesia Transfer of Care Note  Patient: Raymond [name]  Procedure(s) Performed: LAPAROSCOPIC CHOLECYSTECTOMY (N/A )  Patient Location: PACU  Anesthesia Type:General  Level of Consciousness: sedated  Airway & Oxygen Therapy: Patient Spontanous Breathing and Patient connected to face mask oxygen  Post-op Assessment: Report given to RN and Post -op Vital signs reviewed and stable  Post vital signs: Reviewed and stable  Last Vitals:  Vitals Value Taken Time  BP 131/73 01/23/19 1609  Temp 36.6 C 01/23/19 1609  Pulse 88 01/23/19 1609  Resp 26 01/23/19 1609  SpO2 99 % 01/23/19 1609    Last Pain:  Vitals:   01/23/19 1247  TempSrc: Temporal  PainSc: 0-No pain         Complications: No apparent anesthesia complications

## 2019-01-23 NOTE — Discharge Instructions (Signed)

## 2019-01-23 NOTE — Anesthesia Post-op Follow-up Note (Signed)
Anesthesia QCDR form completed.        

## 2019-01-23 NOTE — Interval H&P Note (Signed)
History and Physical Interval Note:  01/23/2019 1:30 PM  Raymond [name]  has presented today for surgery, with the diagnosis of symptomatic cholelithiasis.  The various methods of treatment have been discussed with the patient and family. After consideration of risks, benefits and other options for treatment, the patient has consented to  Procedure(s): LAPAROSCOPIC CHOLECYSTECTOMY (N/A) as a surgical intervention.  The patient's history has been reviewed, patient examined, no change in status, stable for surgery.  I have reviewed the patient's chart and labs.  Questions were answered to the patient's satisfaction.     [name]

## 2019-01-23 NOTE — Op Note (Signed)
  Procedure Date:  01/23/2019  Pre-operative Diagnosis:  Chronic cholecystitis  Post-operative Diagnosis:  Chronic cholecystitis  Procedure:  Laparoscopic cholecystectomy  Surgeon:  Melvyn Neth, MD  Assistant:  Deno Etienne, PA-S  Anesthesia:  General endotracheal  Estimated Blood Loss:  20 ml  Specimens:  gallbladder  Complications:  None  Indications for Procedure:  This is a 53 y.o. male who presents with abdominal pain and workup revealing chronic cholecystitis.  The benefits, complications, treatment options, and expected outcomes were discussed with the patient. The risks of bleeding, infection, recurrence of symptoms, failure to resolve symptoms, bile duct damage, bile duct leak, retained common bile duct stone, bowel injury, and need for further procedures were all discussed with the patient and he was willing to proceed.  Description of Procedure: The patient was correctly identified in the preoperative area and brought into the operating room.  The patient was placed supine with VTE prophylaxis in place.  Appropriate time-outs were performed.  Anesthesia was induced and the patient was intubated.  Appropriate antibiotics were infused.  The abdomen was prepped and draped in a sterile fashion. An infraumbilical incision was made. A cutdown technique was used to enter the abdominal cavity without injury, and a Hasson trocar was inserted.  Pneumoperitoneum was obtained with appropriate opening pressures.  A 5-mm port was placed in the subxiphoid area and two 5-mm ports were placed in the right upper quadrant under direct visualization.  The liver was very cirrhotic and firm.  The gallbladder was identified, which was very shriveled and contracted.  The fundus was grasped and retracted cephalad.  Adhesions were lysed bluntly and with electrocautery. The infundibulum was grasped and retracted laterally, exposing the peritoneum overlying the gallbladder.  This was incised with  electrocautery and extended on either side of the gallbladder.  The cystic duct and cystic artery were clearly identified and bluntly dissected.  Both were clipped twice proximally and once distally, cutting in between.  A possible lymphatic was also identified and clipped and cut.  The gallbladder was taken from the gallbladder fossa in a retrograde fashion with electrocautery. The plane between gallbladder and liver was very hard to identify and there was mild oozing from the liver bed.  There was also spillage of some stones, which were immediately retrieved.  The gallbladder was placed in an Endocatch bag along with one large stone. The liver bed was inspected and any bleeding was controlled with electrocautery. The right upper quadrant was then inspected again revealing intact clips, no bleeding, and no ductal injury.  The area was thoroughly irrigated.  The 5 mm ports were removed under direct visualization and the Hasson trocar was removed.  The Endocatch bag was brought out via the umbilical incision. The fascial opening was closed using 0 vicryl suture.  Local anesthetic was infused in all incisions and the incisions were closed with 3-0 Vicryl and 4-0 Monocryl.  The wounds were cleaned and sealed with DermaBond.  The patient was emerged from anesthesia and extubated and brought to the recovery room for further management.  The patient tolerated the procedure well and all counts were correct at the end of the case.   Melvyn Neth, MD

## 2019-01-24 ENCOUNTER — Encounter: Payer: Self-pay | Admitting: Surgery

## 2019-01-25 ENCOUNTER — Other Ambulatory Visit: Payer: Self-pay

## 2019-01-25 ENCOUNTER — Emergency Department: Payer: Medicare Other

## 2019-01-25 ENCOUNTER — Emergency Department
Admission: EM | Admit: 2019-01-25 | Discharge: 2019-01-25 | Disposition: A | Discharge status: Home / Self Care | Payer: Medicare Other | Attending: Emergency Medicine | Admitting: Emergency Medicine

## 2019-01-25 DIAGNOSIS — E119 Type 2 diabetes mellitus without complications: Secondary | ICD-10-CM | POA: Insufficient documentation

## 2019-01-25 DIAGNOSIS — S8492XA Injury of unspecified nerve at lower leg level, left leg, initial encounter: Secondary | ICD-10-CM | POA: Insufficient documentation

## 2019-01-25 DIAGNOSIS — Y939 Activity, unspecified: Secondary | ICD-10-CM | POA: Insufficient documentation

## 2019-01-25 DIAGNOSIS — S8412XA Injury of peroneal nerve at lower leg level, left leg, initial encounter: Secondary | ICD-10-CM | POA: Diagnosis not present

## 2019-01-25 DIAGNOSIS — Z79899 Other long term (current) drug therapy: Secondary | ICD-10-CM | POA: Insufficient documentation

## 2019-01-25 DIAGNOSIS — Y929 Unspecified place or not applicable: Secondary | ICD-10-CM | POA: Diagnosis not present

## 2019-01-25 DIAGNOSIS — R200 Anesthesia of skin: Secondary | ICD-10-CM | POA: Insufficient documentation

## 2019-01-25 DIAGNOSIS — I10 Essential (primary) hypertension: Secondary | ICD-10-CM | POA: Insufficient documentation

## 2019-01-25 DIAGNOSIS — F17210 Nicotine dependence, cigarettes, uncomplicated: Secondary | ICD-10-CM | POA: Insufficient documentation

## 2019-01-25 DIAGNOSIS — M79605 Pain in left leg: Secondary | ICD-10-CM | POA: Diagnosis not present

## 2019-01-25 DIAGNOSIS — Z7982 Long term (current) use of aspirin: Secondary | ICD-10-CM | POA: Diagnosis not present

## 2019-01-25 DIAGNOSIS — Y998 Other external cause status: Secondary | ICD-10-CM | POA: Diagnosis not present

## 2019-01-25 DIAGNOSIS — Z794 Long term (current) use of insulin: Secondary | ICD-10-CM | POA: Insufficient documentation

## 2019-01-25 DIAGNOSIS — X58XXXA Exposure to other specified factors, initial encounter: Secondary | ICD-10-CM | POA: Diagnosis not present

## 2019-01-25 DIAGNOSIS — S8490XA Injury of unspecified nerve at lower leg level, unspecified leg, initial encounter: Secondary | ICD-10-CM

## 2019-01-25 DIAGNOSIS — S8992XA Unspecified injury of left lower leg, initial encounter: Secondary | ICD-10-CM | POA: Diagnosis present

## 2019-01-25 LAB — CBC WITH DIFFERENTIAL/PLATELET
Abs Immature Granulocytes: 0.08 10*3/uL — ABNORMAL HIGH (ref 0.00–0.07)
Basophils Absolute: 0.1 10*3/uL (ref 0.0–0.1)
Basophils Relative: 1 %
Eosinophils Absolute: 0.1 10*3/uL (ref 0.0–0.5)
Eosinophils Relative: 1 %
HCT: 43.1 % (ref 39.0–52.0)
Hemoglobin: 14.7 g/dL (ref 13.0–17.0)
Immature Granulocytes: 1 %
Lymphocytes Relative: 21 %
Lymphs Abs: 2.5 10*3/uL (ref 0.7–4.0)
MCH: 30.6 pg (ref 26.0–34.0)
MCHC: 34.1 g/dL (ref 30.0–36.0)
MCV: 89.8 fL (ref 80.0–100.0)
Monocytes Absolute: 1 10*3/uL (ref 0.1–1.0)
Monocytes Relative: 9 %
Neutro Abs: 8.1 10*3/uL — ABNORMAL HIGH (ref 1.7–7.7)
Neutrophils Relative %: 67 %
Platelets: 189 10*3/uL (ref 150–400)
RBC: 4.8 MIL/uL (ref 4.22–5.81)
RDW: 13.4 % (ref 11.5–15.5)
WBC: 11.9 10*3/uL — ABNORMAL HIGH (ref 4.0–10.5)
nRBC: 0 % (ref 0.0–0.2)

## 2019-01-25 LAB — COMPREHENSIVE METABOLIC PANEL
ALT: 73 U/L — ABNORMAL HIGH (ref 0–44)
AST: 52 U/L — ABNORMAL HIGH (ref 15–41)
Albumin: 3.8 g/dL (ref 3.5–5.0)
Alkaline Phosphatase: 104 U/L (ref 38–126)
Anion gap: 13 (ref 5–15)
BUN: 12 mg/dL (ref 6–20)
CO2: 22 mmol/L (ref 22–32)
Calcium: 8.7 mg/dL — ABNORMAL LOW (ref 8.9–10.3)
Chloride: 101 mmol/L (ref 98–111)
Creatinine, Ser: 0.92 mg/dL (ref 0.61–1.24)
GFR calc Af Amer: >60 mL/min (ref >60)
GFR calc non Af Amer: >60 mL/min (ref >60)
Glucose, Bld: 255 mg/dL — ABNORMAL HIGH (ref 70–99)
Potassium: 3.7 mmol/L (ref 3.5–5.1)
Sodium: 136 mmol/L (ref 135–145)
Total Bilirubin: 1.1 mg/dL (ref 0.3–1.2)
Total Protein: 8.1 g/dL (ref 6.5–8.1)

## 2019-01-25 LAB — URINALYSIS, ROUTINE W REFLEX MICROSCOPIC
Bacteria, UA: NONE SEEN
Bilirubin Urine: NEGATIVE
Glucose, UA: >=500 mg/dL — AB
Hgb urine dipstick: NEGATIVE
Ketones, ur: 5 mg/dL — AB
Leukocytes,Ua: NEGATIVE
Nitrite: NEGATIVE
Protein, ur: 30 mg/dL — AB
Specific Gravity, Urine: 1.032 — ABNORMAL HIGH (ref 1.005–1.030)
pH: 6 (ref 5.0–8.0)

## 2019-01-25 LAB — LIPASE, BLOOD: Lipase: 30 U/L (ref 11–51)

## 2019-01-25 MED ORDER — ONDANSETRON 4 MG PO TBDP
4.0000 mg | ORAL_TABLET | Freq: Once | ORAL | Status: AC
  Administered 2019-01-25: 08:00:00 4 mg via ORAL
  Filled 2019-01-25: qty 1

## 2019-01-25 NOTE — ED Notes (Signed)
Pt to front desk inquiring about how much longer it would be before he goes back to a room. Pt was informed that we cannot give out wait times but that there are currently 3 people who have been waiting longer than him. Pt verbalized understanding. Pt states that he is feeling nauseated. Pt was offered ODT Zofran. Standing Order placed.

## 2019-01-25 NOTE — ED Provider Notes (Signed)
[facility] Emergency Department Provider Note ____________________________________________   First MD Initiated Contact with Patient 01/25/19 [phone]     (approximate)  I have reviewed the triage vital signs and the nursing notes.   HISTORY  Chief Complaint Burning Sensation    HPI Raymond [name] is a 53 y.o. male with PMH as noted below who presents with left leg pain, gradual onset over the last day, and described as burning.  He states it ranges from his left hip towards the knee.  It also radiates into the left groin.  He has no pain in the lower leg.  The patient states that he had gallbladder surgery performed 2 days ago and initially felt numbness in the same area on the lateral part of the left leg.  He has also had burning in bilateral lower legs before that he has taken gabapentin for.  The patient denies any trauma.  He has no weakness or difficulty walking.  He has no swelling to the leg.  He denies any fever or chills.  He denies any significant back pain.  Past Medical History:  Diagnosis Date  . Anxiety   . Arthritis    NECK  . Bronchitis   . Depression   . Diabetes mellitus without complication ([place])   . 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 Dyspnea    DUE TO GALLBLADDER PER PT  . Family history of adverse reaction to anesthesia    N/V, TROUBLE BREATHING COMING OUT OF ANESTHESIA  . Fatty liver   . GERD (gastroesophageal reflux disease)   . Headache    MIGRAINES  . Hyperlipidemia   . Hypertension   . Irregular heart beat unk  . Myocardial infarction ([place]) 2010   MILD   . PTSD (post-traumatic stress disorder)   . Sleep apnea    USES CPAP  . Tachycardia     Patient Active Problem List   Diagnosis Date Noted  . Right upper quadrant pain   . Encounter for other preprocedural examination 01/20/2019  . PTSD (post-traumatic stress disorder) 01/20/2019  . MDD (major depressive disorder), recurrent episode, moderate ([place])  01/20/2019  . Insomnia due to mental disorder 01/20/2019  . Calculus of gallbladder with acute on chronic cholecystitis without obstruction 11/15/2018  . Generalized abdominal pain 10/08/2018  . Local superficial swelling, mass or lump 10/08/2018  . Enterocolitis 08/16/2018  . Left upper quadrant abdominal pain of unknown etiology 08/16/2018  . Diastasis recti 06/19/2018  . Generalized anxiety disorder 04/01/2018  . Clotted blood in stool 12/31/2017  . Diarrhea 12/31/2017  . Encounter for general adult medical examination with abnormal findings 10/27/2017  . Uncontrolled type 2 diabetes mellitus with hyperglycemia ([place]) 10/27/2017  . Onychomycosis with ingrown toenail 10/27/2017  . Dysuria 10/27/2017  . Uncontrolled type 2 diabetes mellitus with hypoglycemia without coma ([place]) 07/08/2017  . Shortness of breath 07/08/2017  . Cigarette smoker 07/08/2017  . Influenza A 05/30/2017  . Obstructive sleep apnea syndrome 08/12/2015  . Cervical spondylosis 06/14/2015  . Essential hypertension 04/08/2015  . Depression 04/08/2015  . Gastroesophageal reflux disease without esophagitis 01/05/2015  . 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  ingrown toenail removal    . CHOLECYSTECTOMY N/A 01/23/2019   Procedure: LAPAROSCOPIC CHOLECYSTECTOMY;  Surgeon: Olean Ree, MD;  Location: [facility] ORS;  Service: General;  Laterality: N/A;  . CLAVICLE SURGERY Right   . COLONOSCOPY  2017  .  MOUTH SURGERY     REMOVED ALL TEETH  . SHOULDER SURGERY Right   . UPPER GI ENDOSCOPY  2017    Prior to Admission medications   Medication Sig Start Date End Date Taking? Authorizing Provider  ACCU-CHEK FASTCLIX LANCETS MISC yse as directed twice a day 05/07/17   Ronnell Freshwater, NP  albuterol (PROAIR HFA) 108 (90 Base) MCG/ACT inhaler Inhale 2-4 puffs by mouth Every 4-6 hours as needed for wheezing, cough, and/or shortness of breath 07/31/18    Ronnell Freshwater, NP  aspirin EC 81 MG tablet Take 81 mg by mouth daily at 3 pm.  01/12/16   [provider]  dicyclomine (BENTYL) 10 MG capsule Take 1 capsule (10 mg total) by mouth 4 (four) times daily -  before meals and at bedtime for 30 doses. 01/07/19 01/17/20  Lin Landsman, MD  famotidine (PEPCID) 20 MG tablet Take 1 tablet (20 mg total) by mouth 2 (two) times daily. 01/16/19   Kendell Bane, NP  gabapentin (NEURONTIN) 100 MG capsule Take 1 capsule (100 mg total) by mouth 3 (three) times daily. 11/12/18   Edrick Kins, DPM  glucose blood (ACCU-CHEK AVIVA PLUS) test strip Blood sugar testing TID and as needed. E11.65 10/05/17   Ronnell Freshwater, NP  ibuprofen (ADVIL) 600 MG tablet Take 1 tablet (600 mg total) by mouth every 8 (eight) hours as needed for mild pain or moderate pain. 01/23/19   Piscoya, Jacqulyn Bath, MD  Insulin Glargine, 2 Unit Dial, (TOUJEO MAX SOLOSTAR) 300 UNIT/ML SOPN Inject 60 Units into the skin daily. Patient taking differently: Inject 64 Units into the skin at bedtime.  11/18/18   Ronnell Freshwater, NP  insulin lispro (HUMALOG KWIKPEN) 100 UNIT/ML KwikPen Use as directed per  sliding scale max units 8-10 per meal Patient taking differently: Inject 12-15 Units into the skin 3 (three) times daily between meals. Sliding Scale Insulin 12/03/18   Ronnell Freshwater, NP  metoprolol tartrate (LOPRESSOR) 100 MG tablet Take 1 tablet (100 mg total) by mouth 2 (two) times daily. 11/18/18   Ronnell Freshwater, NP  mirtazapine (REMERON) 45 MG tablet Take 1 tablet (45 mg total) by mouth at bedtime. 11/18/18   Ursula Alert, MD  nitroGLYCERIN (NITROSTAT) 0.4 MG SL tablet Place 0.4 mg under the tongue every 5 (five) minutes x 3 doses as needed for chest pain.     [provider]  oxyCODONE (OXY IR/ROXICODONE) 5 MG immediate release tablet Take 1 tablet (5 mg total) by mouth every 4 (four) hours as needed for severe pain. 01/23/19   Olean Ree, MD  pantoprazole (PROTONIX) 40  MG tablet Take 1 tablet (40 mg total) by mouth daily. Patient taking differently: Take 40 mg by mouth every morning.  09/13/18   Ronnell Freshwater, NP  QUEtiapine (SEROQUEL) 50 MG tablet Take 1 tablet (50 mg total) by mouth 2 (two) times daily as needed. Patient taking differently: Take 50 mg by mouth 2 (two) times daily.  11/18/18   Ursula Alert, MD  sitaGLIPtin-metformin (JANUMET) 50-500 MG tablet Take 1 tablet by mouth 2 (two) times daily. 04/01/18   Ronnell Freshwater, NP  traZODone (DESYREL) 100 MG tablet Take 2.5 tablets (250 mg total) by mouth at bedtime. 11/18/18   Ursula Alert, MD    Allergies Onion, Hydrocodone, Norco [hydrocodone-acetaminophen], Hydrocodone-acetaminophen, and Nickel  Family History  Problem Relation Age of Onset  . Hypertension Mother   . Diabetes type II Mother   .  Diabetes Mother   . COPD Father   . Cancer Father   . Heart disease Father   . Diabetes Sister   . Anxiety disorder Sister   . Depression Sister   . Diabetes Brother   . Anxiety disorder Brother   . Depression Brother   . Diabetes Maternal Aunt   . Diabetes Sister   . Anxiety disorder Sister   . Depression Sister   . Diabetes Sister   . Anxiety disorder Sister   . Depression Sister   . Diabetes Sister   . Anxiety disorder Sister   . Depression Sister   . Colon cancer Maternal Uncle     Social History Social History   Tobacco Use  . Smoking status: Current Every Day Smoker    Packs/day: 2.00    Years: 40.00    Pack years: 80.00    Types: Cigarettes  . Smokeless tobacco: Former Systems developer    Types: Chew  . Tobacco comment: Reports has tried multiple things to help quit and cut back.   Substance Use Topics  . Alcohol use: No    Alcohol/week: 0.0 standard drinks    Frequency: Never  . Drug use: No    Review of Systems  Constitutional: No fever/chills. Eyes: No redness. ENT: No sore throat. Cardiovascular: Denies chest pain. Respiratory: Denies shortness of  breath. Gastrointestinal: No vomiting or diarrhea.  Genitourinary: Negative for dysuria.  Musculoskeletal: Negative for back pain.  Positive for left leg pain. Skin: Negative for rash. Neurological: Negative for headaches or focal weakness.  Positive for lateral left leg numbness and burning.   ____________________________________________   PHYSICAL EXAM:  VITAL SIGNS: ED Triage Vitals  Enc Vitals Group     BP 01/25/19 0615 (!) 156/87     Pulse Rate 01/25/19 0615 (!) 110     Resp 01/25/19 0615 18     Temp 01/25/19 0615 98.3 F (36.8 C)     Temp Source 01/25/19 0615 Oral     SpO2 01/25/19 0615 95 %     Weight 01/25/19 0616 247 lb (112 kg)     Height 01/25/19 0616 5\' 9"  (1.753 m)     Head Circumference --      Peak Flow --      Pain Score 01/25/19 0614 8     Pain Loc --      Pain Edu? --      Excl. in [place]? --     Constitutional: Alert and oriented.  Relatively well appearing and in no acute distress. Eyes: Conjunctivae are normal.  Head: Atraumatic. Nose: No congestion/rhinnorhea. Mouth/Throat: Mucous membranes are moist.   Neck: Normal range of motion.  Cardiovascular: Normal rate, regular rhythm. Good peripheral circulation. Respiratory: Normal respiratory effort.  No retractions.  Gastrointestinal: Soft and nontender.  Genitourinary: No flank tenderness. Musculoskeletal: No lower extremity edema.  Extremities warm and well perfused.  2+ DP pulses bilaterally.  Negative straight leg raise.  No midline spinal tenderness or significant paraspinal muscle tenderness. Neurologic:  Normal speech and language. No gross focal neurologic deficits are appreciated.  5/5 motor strength of bilateral lower extremities proximally and distally.  Intact fine touch sensation of bilateral lower extremities. Skin:  Skin is warm and dry. No rash noted. Psychiatric: Mood and affect are normal. Speech and behavior are normal.  ____________________________________________   LABS (all labs  ordered are listed, but only abnormal results are displayed)  Labs Reviewed  CBC WITH DIFFERENTIAL/PLATELET - Abnormal; Notable for the following components:  Result Value   WBC 11.9 (*)    Neutro Abs 8.1 (*)    Abs Immature Granulocytes 0.08 (*)    All other components within normal limits  COMPREHENSIVE METABOLIC PANEL - Abnormal; Notable for the following components:   Glucose, Bld 255 (*)    Calcium 8.7 (*)    AST 52 (*)    ALT 73 (*)    All other components within normal limits  URINALYSIS, ROUTINE W REFLEX MICROSCOPIC - Abnormal; Notable for the following components:   Color, Urine YELLOW (*)    APPearance CLEAR (*)    Specific Gravity, Urine 1.032 (*)    Glucose, UA >=500 (*)    Ketones, ur 5 (*)    Protein, ur 30 (*)    All other components within normal limits  LIPASE, BLOOD   ____________________________________________  EKG   ____________________________________________  RADIOLOGY  US venous LLE: No evidence of acute DVT  ____________________________________________   PROCEDURES  Procedure(s) performed: No  Procedures  Critical Care performed: No ____________________________________________   INITIAL IMPRESSION / ASSESSMENT AND PLAN / ED COURSE  Pertinent labs & imaging results that were available during my care of the patient were reviewed by me and considered in my medical decision making (see chart for details).  53 year old male with PMH as noted above and status post laparoscopic cholecystectomy 2 days ago presents with a painful burning sensation to the lateral part of the left leg from the hip to the knee which began after the surgery as numbness.  Patient denies any trauma.  He has no weakness.  On exam, the patient is overall well-appearing.  His vital signs are normal.  Lower extremity exam is unremarkable.  Straight leg raise is negative.  He has normal motor strength, no significant sensory deficits.  He has mild tenderness in the  left groin but no palpable lymphadenopathy.  Overall I suspect neurapraxia or sciatica precipitated by positioning during the surgery.  Patient does have some history of peripheral neuropathy and has taken gabapentin in the past.  I reviewed the past medical records in epic including the operative note.  Given the laparoscopic surgery and the fact that it was in the right upper quadrant there is no evidence of any surgical complication or any risk of nerve injury.  The patient has no motor deficits or any evidence of cauda equina syndrome or significant nerve impingement.  However, being post surgery the patient is at slightly increased risk of a DVT.  We will obtain a left lower extremity DVT study.  There is no indication for spinal imaging.  If the ultrasound is negative, the patient will be stable for discharge home.  ----------------------------------------- 11:01 AM on 01/25/2019 -----------------------------------------  Ultrasound shows no findings of acute DVT.  The patient is ambulating without difficulty.  He appears comfortable.  He is stable for discharge at this time.  I advised him to restart the gabapentin that he is prescribed but stopped taking on his own, in addition to the pain medication he is taking after the surgery.  I gave the patient the return precautions and he expressed understanding.   ____________________________________________   FINAL CLINICAL IMPRESSION(S) / ED DIAGNOSES  Final diagnoses:  Neuropraxia of lower extremity      NEW MEDICATIONS STARTED DURING THIS VISIT:  New Prescriptions   No medications on file     Note:  This document was prepared using Dragon voice recognition software and may include unintentional dictation errors.    Arta Silence,  MD 01/25/19 1102

## 2019-01-25 NOTE — Discharge Instructions (Addendum)
Take the gabapentin as you are prescribed in addition to the pain medication you are on for the surgery.  Follow-up with your surgeon and your primary care doctor.  Return to the ER immediately if you have worsening pain or numbness, or develop any weakness, difficulty walking or lifting the leg, swelling, fever, or any other new or worsening symptoms that concern you.

## 2019-01-25 NOTE — ED Triage Notes (Signed)
Patient reports had gall bladder surgery on Wednesday, after surgery left leg had a numb feeling.  Now has burning sensation from hip to knee.

## 2019-01-28 ENCOUNTER — Encounter: Payer: Self-pay | Admitting: Licensed Clinical Social Worker

## 2019-01-28 ENCOUNTER — Other Ambulatory Visit: Payer: Self-pay

## 2019-01-28 ENCOUNTER — Ambulatory Visit (INDEPENDENT_AMBULATORY_CARE_PROVIDER_SITE_OTHER): Payer: Medicare Other | Admitting: Licensed Clinical Social Worker

## 2019-01-28 DIAGNOSIS — F4310 Post-traumatic stress disorder, unspecified: Secondary | ICD-10-CM | POA: Diagnosis not present

## 2019-01-28 LAB — SURGICAL PATHOLOGY

## 2019-01-28 NOTE — Progress Notes (Signed)
Virtual Visit via Video Note  I connected with [name] on 01/28/19 at  9:00 AM EDT by a video enabled telemedicine application and verified that I am speaking with the correct person using two identifiers.   I discussed the limitations of evaluation and management by telemedicine and the availability of in person appointments. The patient expressed understanding and agreed to proceed.    I discussed the assessment and treatment plan with the patient. The patient was provided an opportunity to ask questions and all were answered. The patient agreed with the plan and demonstrated an understanding of the instructions.   The patient was advised to call back or seek an in-person evaluation if the symptoms worsen or if the condition fails to improve as anticipated.  I provided 30 minutes of non-face-to-face time during this encounter.   Alden Hipp, LCSW    THERAPIST PROGRESS NOTE  Session Time: 0900  Participation Level: Active  Behavioral Response: NeatAlertAnxious  Type of Therapy: Individual Therapy  Treatment Goals addressed: Coping  Interventions: Supportive  Summary: RAAGHAV HERNDON is a 53 y.o. male who presents with continued symptoms related to his diagnosis. Dante reports doing well regarding his mental health, but stated he is having some issues following his surgery this past Thursday. Clayten reports having gaul bladder surgery and following surgery, he reports having pain in his leg--which has nothing to do with the surgery he had. He reports he went to the ER, but they told him to wait it out and follow up with his MD. LCSW encouraged Kishaun to call his doctor that performed the surgery in order to address concerns he is having post surgery. Jediah expressed understanding and agreement. We discussed the ways physical pain can impact mental health, which Kwanza was in agreement with. He stated his physical pain, at the moment, is his only stressor. We  discussed ways he could utilize distraction to assist in the process as well.   Suicidal/Homicidal: No  Therapist Response: Yusuf continues to work towards his tx goals but has not yet reached them. We will continue to work on emotional regulation skills and improving communication moving forward.   Plan: Return again in 8 weeks.  Diagnosis: Axis I: Post Traumatic Stress Disorder    Axis II: No diagnosis    Alden Hipp, LCSW 01/28/2019

## 2019-02-06 ENCOUNTER — Encounter: Payer: Self-pay | Admitting: Physician Assistant

## 2019-02-06 ENCOUNTER — Other Ambulatory Visit: Payer: Self-pay

## 2019-02-06 ENCOUNTER — Ambulatory Visit (INDEPENDENT_AMBULATORY_CARE_PROVIDER_SITE_OTHER): Payer: Medicare Other | Admitting: Physician Assistant

## 2019-02-06 DIAGNOSIS — Z09 Encounter for follow-up examination after completed treatment for conditions other than malignant neoplasm: Secondary | ICD-10-CM

## 2019-02-06 DIAGNOSIS — R1011 Right upper quadrant pain: Secondary | ICD-10-CM

## 2019-02-06 MED ORDER — OXYCODONE HCL 5 MG PO TABS: 5.0000 mg | ORAL_TABLET | Freq: Four times a day (QID) | ORAL | 0 refills | Status: DC | PRN

## 2019-02-06 NOTE — Progress Notes (Signed)
[facility] POST-OP OFFICE VISIT  02/06/2019  HPI: Raymond [name] is a 53 y.o. male 14 days s/p laparoscopic cholecystectomy with Dr Hampton Abbot.  Today, he reports from a surgical standpoint he is doing well. He denied any abdominal pain, nausea, emesis, trouble with PO intake, or diarrhea. His biggest complaints if anterior upper leg pain which he reports onset about 2 days following surgery. Describes the pain as "hot coals." He does not a history of "bulging disc" in his lumbar spine. No previous trauma. He notes oxycodone helps some. His gabapentin and ibuprofen are not helping.   Vital signs: There were no vitals taken for this visit.   Physical Exam: Constitutional: Well appearing male, NAD Abdomen: Soft, non-tender, non-distended, no rebound/guarding Skin: The most lateral RUQ laparoscopic incision has dehisced at the skin edge, there is no drainage. The remaining laparoscopic incisions are CDI, no erythema or drainage.  MSK: Tenderness over the anterior portion of his left thigh, non-tender throughout the remaining extremity. Sensation intact distally. Strength 5/5 in bilateral lower extremities.   Assessment/Plan: This is a 53 y.o. 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 I will give him a small refill of his oxycodone however I feel this is more pertinently managed by his primary care physician  - Okay to shower  - complete 4 weeks total of lifting restrictions  - reviewed pathology: chronic cholecystitis  - rtc prn  -- Edison Simon, PA-C [facility] 02/06/2019, 9:47 AM [phone] M-F: 7am - 4pm

## 2019-02-06 NOTE — Patient Instructions (Addendum)
No lifting greater than 20 pounds until after 02/20/2019. Please call the office if you have any questions or concerns.   Laparoscopic Cholecystectomy, Care After This sheet gives you information about how to care for yourself after your procedure. Your doctor may also give you more specific instructions. If you have problems or questions, contact your doctor. Follow these instructions at home: Care for cuts from surgery (incisions)   Follow instructions from your doctor about how to take care of your cuts from surgery. Make sure you: ? Wash your hands with soap and water before you change your bandage (dressing). If you cannot use soap and water, use hand sanitizer. ? Change your bandage as told by your doctor. ? Leave stitches (sutures), skin glue, or skin tape (adhesive) strips in place. They may need to stay in place for 2 weeks or longer. If tape strips get loose and curl up, you may trim the loose edges. Do not remove tape strips completely unless your doctor says it is okay.  Do not take baths, swim, or use a hot tub until your doctor says it is okay. Ask your doctor if you can take showers. You may only be allowed to take sponge baths for bathing.  Check your surgical cut area every day for signs of infection. Check for: ? More redness, swelling, or pain. ? More fluid or blood. ? Warmth. ? Pus or a bad smell. Activity  Do not drive or use heavy machinery while taking prescription pain medicine.  Do not lift anything that is heavier than 10 lb (4.5 kg) until your doctor says it is okay.  Do not play contact sports until your doctor says it is okay.  Do not drive for 24 hours if you were given a medicine to help you relax (sedative).  Rest as needed. Do not return to work or school until your doctor says it is okay. General instructions  Take over-the-counter and prescription medicines only as told by your doctor.  To prevent or treat constipation while you are taking  prescription pain medicine, your doctor may recommend that you: ? Drink enough fluid to keep your pee (urine) clear or pale yellow. ? Take over-the-counter or prescription medicines. ? Eat foods that are high in fiber, such as fresh fruits and vegetables, whole grains, and beans. ? Limit foods that are high in fat and processed sugars, such as fried and sweet foods. Contact a doctor if:  You develop a rash.  You have more redness, swelling, or pain around your surgical cuts.  You have more fluid or blood coming from your surgical cuts.  Your surgical cuts feel warm to the touch.  You have pus or a bad smell coming from your surgical cuts.  You have a fever.  One or more of your surgical cuts breaks open. Get help right away if:  You have trouble breathing.  You have chest pain.  You have pain that is getting worse in your shoulders.  You faint or feel dizzy when you stand.  You have very bad pain in your belly (abdomen).  You are sick to your stomach (nauseous) for more than one day.  You have throwing up (vomiting) that lasts for more than one day.  You have leg pain. This information is not intended to replace advice given to you by your health care provider. Make sure you discuss any questions you have with your health care provider. Document Released: 01/18/2008 Document Revised: 03/23/2017 Document Reviewed: 09/27/2015 Elsevier  Patient Education  [employer].

## 2019-03-03 ENCOUNTER — Encounter: Payer: Self-pay | Admitting: Internal Medicine

## 2019-03-03 ENCOUNTER — Other Ambulatory Visit: Payer: Self-pay

## 2019-03-03 ENCOUNTER — Ambulatory Visit (INDEPENDENT_AMBULATORY_CARE_PROVIDER_SITE_OTHER): Payer: Medicare Other | Admitting: Internal Medicine

## 2019-03-03 DIAGNOSIS — R0600 Dyspnea, unspecified: Secondary | ICD-10-CM | POA: Diagnosis not present

## 2019-03-03 DIAGNOSIS — I10 Essential (primary) hypertension: Secondary | ICD-10-CM

## 2019-03-03 DIAGNOSIS — R0609 Other forms of dyspnea: Secondary | ICD-10-CM

## 2019-03-03 DIAGNOSIS — E1165 Type 2 diabetes mellitus with hyperglycemia: Secondary | ICD-10-CM

## 2019-03-03 DIAGNOSIS — F331 Major depressive disorder, recurrent, moderate: Secondary | ICD-10-CM

## 2019-03-03 DIAGNOSIS — R635 Abnormal weight gain: Secondary | ICD-10-CM | POA: Diagnosis not present

## 2019-03-03 LAB — POCT GLYCOSYLATED HEMOGLOBIN (HGB A1C): Hemoglobin A1C: 11.7 % — AB (ref 4.0–5.6)

## 2019-03-03 NOTE — Progress Notes (Signed)
[facility] [place], [place] [address]  Internal MEDICINE  Office Visit Note  Patient Name: Raymond [name]  [id number]  [id number]  Date of Service: 03/04/2019  Chief Complaint  Patient presents with  . Medical Management of Chronic Issues    2 month follow up  . Diabetes    blood sugar this am 381 , last A1c on 01/16/19. 10.1  . Hypertension  . Hyperlipidemia  . Back Pain    back and right leg pain since surgery on the 1st of this month , not getting any better at this time   . Quality Metric Gaps    eye exam , declined flu shot at this time    HPI Pt is here for routine follow up today. Recent cholecystectomy, recovering from it, started have righ leg and hip pain went to ED and now back on gabapentin. Feels ok. Does see hi psych on a regular basis, his medicines have been working well but still has problem sleeping. He has gained more weight and diabetes is not well controlled.Pt is on Remeron and Seroquel for MDD and PTSD. He is also C/O SOB and hardly can walk a block. He does smoke heavily. Pt is not very adherent with his CPAP Previous ECHO did show LVH and diastolic dysfunction H/O MI and COPD in the past C/O SOB with exertion, can only walk a block   Current Medication: Outpatient Encounter Medications as of 03/03/2019  Medication Sig  . ACCU-CHEK FASTCLIX LANCETS MISC yse as directed twice a day  . albuterol (PROAIR HFA) 108 (90 Base) MCG/ACT inhaler Inhale 2-4 puffs by mouth Every 4-6 hours as needed for wheezing, cough, and/or shortness of breath  . aspirin EC 81 MG tablet Take 81 mg by mouth daily at 3 pm.   . dicyclomine (BENTYL) 10 MG capsule Take 1 capsule (10 mg total) by mouth 4 (four) times daily -  before meals and at bedtime for 30 doses.  . famotidine (PEPCID) 20 MG tablet Take 1 tablet (20 mg total) by mouth 2 (two) times daily.  Marland Kitchen gabapentin (NEURONTIN) 100 MG capsule Take 1 capsule (100 mg total) by mouth 3 (three) times daily.   Marland Kitchen glucose blood (ACCU-CHEK AVIVA PLUS) test strip Blood sugar testing TID and as needed. E11.65  . ibuprofen (ADVIL) 600 MG tablet Take 1 tablet (600 mg total) by mouth every 8 (eight) hours as needed for mild pain or moderate pain.  . Insulin Glargine, 2 Unit Dial, (TOUJEO MAX SOLOSTAR) 300 UNIT/ML SOPN Inject 60 Units into the skin daily. (Patient taking differently: Inject 64 Units into the skin at bedtime. )  . insulin lispro (HUMALOG KWIKPEN) 100 UNIT/ML KwikPen Use as directed per  sliding scale max units 8-10 per meal (Patient taking differently: Inject 12-15 Units into the skin 3 (three) times daily between meals. Sliding Scale Insulin)  . metoprolol tartrate (LOPRESSOR) 100 MG tablet Take 1 tablet (100 mg total) by mouth 2 (two) times daily.  . mirtazapine (REMERON) 45 MG tablet Take 1 tablet (45 mg total) by mouth at bedtime.  . nitroGLYCERIN (NITROSTAT) 0.4 MG SL tablet Place 0.4 mg under the tongue every 5 (five) minutes x 3 doses as needed for chest pain.   . pantoprazole (PROTONIX) 40 MG tablet Take 1 tablet (40 mg total) by mouth daily. (Patient taking differently: Take 40 mg by mouth every morning. )  . QUEtiapine (SEROQUEL) 50 MG tablet Take 1 tablet (50 mg total) by mouth  2 (two) times daily as needed. (Patient taking differently: Take 50 mg by mouth 2 (two) times daily. )  . sitaGLIPtin-metformin (JANUMET) 50-500 MG tablet Take 1 tablet by mouth 2 (two) times daily.  . traZODone (DESYREL) 100 MG tablet Take 2.5 tablets (250 mg total) by mouth at bedtime.  . [DISCONTINUED] oxyCODONE (ROXICODONE) 5 MG immediate release tablet Take 1 tablet (5 mg total) by mouth every 6 (six) hours as needed. (Patient not taking: Reported on 03/03/2019)   No facility-administered encounter medications on file as of 03/03/2019.     Surgical History: Past Surgical History:  Procedure Laterality Date  .  ingrown toenail removal    . CHOLECYSTECTOMY N/A 01/23/2019   Procedure: LAPAROSCOPIC  CHOLECYSTECTOMY;  Surgeon: Olean Ree, MD;  Location: [facility] ORS;  Service: General;  Laterality: N/A;  . CLAVICLE SURGERY Right   . COLONOSCOPY  2017  . MOUTH SURGERY     REMOVED ALL TEETH  . SHOULDER SURGERY Right   . UPPER GI ENDOSCOPY  2017    Medical History: Past Medical History:  Diagnosis Date  . Anxiety   . Arthritis    NECK  . Bronchitis   . Depression   . Diabetes mellitus without complication ([place])   . 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 Dyspnea    DUE TO GALLBLADDER PER PT  . Family history of adverse reaction to anesthesia    N/V, TROUBLE BREATHING COMING OUT OF ANESTHESIA  . Fatty liver   . GERD (gastroesophageal reflux disease)   . Headache    MIGRAINES  . Hyperlipidemia   . Hypertension   . Irregular heart beat unk  . Myocardial infarction ([place]) 2010   MILD   . PTSD (post-traumatic stress disorder)   . Sleep apnea    USES CPAP  . Tachycardia     Family History: Family History  Problem Relation Age of Onset  . Hypertension Mother   . Diabetes type II Mother   . Diabetes Mother   . COPD Father   . Cancer Father   . Heart disease Father   . Diabetes Sister   . Anxiety disorder Sister   . Depression Sister   . Diabetes Brother   . Anxiety disorder Brother   . Depression Brother   . Diabetes Maternal Aunt   . Diabetes Sister   . Anxiety disorder Sister   . Depression Sister   . Diabetes Sister   . Anxiety disorder Sister   . Depression Sister   . Diabetes Sister   . Anxiety disorder Sister   . Depression Sister   . Colon cancer Maternal Uncle     Social History   Socioeconomic History  . Marital status: Divorced    Spouse name: Not on file  . Number of children: 4  . Years of education: Not on file  . Highest education level: 8th grade  Occupational History  . Not on file  Social Needs  . Financial resource strain: Very hard  . Food insecurity    Worry: Often true    Inability: Often true  . Transportation needs     Medical: No    Non-medical: No  Tobacco Use  . Smoking status: Current Every Day Smoker    Packs/day: 2.00    Years: 40.00    Pack years: 80.00    Types: Cigarettes  . Smokeless tobacco: Former Systems developer    Types: Chew  . Tobacco comment: Reports has tried multiple things to help quit and  cut back.   Substance and Sexual Activity  . Alcohol use: No    Alcohol/week: 0.0 standard drinks    Frequency: Never  . Drug use: No  . Sexual activity: Not Currently  Lifestyle  . Physical activity    Days per week: 0 days    Minutes per session: 0 min  . Stress: Very much  Relationships  . Social Herbalist on phone: Not on file    Gets together: Not on file    Attends religious service: Never    Active member of club or organization: No    Attends meetings of clubs or organizations: Never    Relationship status: Divorced  . Intimate partner violence    Fear of current or ex partner: No    Emotionally abused: No    Physically abused: No    Forced sexual activity: No  Other Topics Concern  . Not on file  Social History Narrative  . Not on file    Review of Systems  Constitutional: Negative for chills, fatigue and unexpected weight change.  HENT: Positive for postnasal drip. Negative for congestion, rhinorrhea, sneezing and sore throat.   Eyes: Negative for redness.  Respiratory: Negative for cough, chest tightness and shortness of breath.   Cardiovascular: Negative for chest pain and palpitations.  Gastrointestinal: Negative for abdominal pain, constipation, diarrhea, nausea and vomiting.  Endocrine: Positive for polydipsia and polyphagia.  Genitourinary: Negative for dysuria and frequency.  Musculoskeletal: Negative for arthralgias, back pain, joint swelling and neck pain.  Skin: Negative for rash.  Neurological: Negative.  Negative for tremors and numbness.  Hematological: Negative for adenopathy. Does not bruise/bleed easily.  Psychiatric/Behavioral: Negative for  behavioral problems (Depression), sleep disturbance and suicidal ideas. The patient is not nervous/anxious.     Vital Signs: BP 140/84   Pulse 80   Temp (!) 97.4 F (36.3 C)   Resp 16   Ht 5\' 9"  (1.753 m)   Wt 245 lb 12.8 oz (111.5 kg)   SpO2 98%   BMI 36.30 kg/m    Physical Exam Constitutional:      General: He is not in acute distress.    Appearance: He is well-developed. He is obese. He is not diaphoretic.  HENT:     Head: Normocephalic and atraumatic.     Mouth/Throat:     Pharynx: No oropharyngeal exudate.  Eyes:     Pupils: Pupils are equal, round, and reactive to light.  Neck:     Musculoskeletal: Normal range of motion and neck supple.     Thyroid: No thyromegaly.     Vascular: No JVD.     Trachea: No tracheal deviation.  Cardiovascular:     Rate and Rhythm: Normal rate and regular rhythm.     Pulses: Normal pulses.     Heart sounds: Normal heart sounds. No murmur. No friction rub. No gallop.   Pulmonary:     Effort: Pulmonary effort is normal. No respiratory distress.     Breath sounds: No wheezing or rales.  Chest:     Chest wall: No tenderness.  Abdominal:     General: Bowel sounds are normal.     Palpations: Abdomen is soft.  Musculoskeletal: Normal range of motion.  Lymphadenopathy:     Cervical: No cervical adenopathy.  Skin:    General: Skin is warm and dry.  Neurological:     Mental Status: He is alert and oriented to person, place, and time.  Cranial Nerves: No cranial nerve deficit.  Psychiatric:        Behavior: Behavior normal.        Thought Content: Thought content normal.        Judgment: Judgment normal.    Assessment/Plan: 1. Uncontrolled type 2 diabetes mellitus with hyperglycemia (HCC) - POCT HgB A1C (11.7 worse from 10.1), add  - Pt to continue His Toujeo, samples of farxiga is given as well, continue janumet as before, Stopped Lipitor, pt is not on ace- inhibitor either, does have              problem with compliance   2.  Dyspnea on exertion - Pt has has h/o MI continues to smoke will need further diagnostics - ECHOCARDIOGRAM COMPLETE; Future  3. Essential hypertension - Continue Labetalol as before, will add Ace- inh on next visit  4. Abnormal weight gain - Multifactorial, pt is on Seroquel and Remeron, which makes it worse, will speak with Psych about change of therapy, pt needs to have better adherence          with his diabetic diet   5. MDD (major depressive disorder), recurrent episode, moderate ([place]) - Will speak with Psychiatry   General Counseling: Zachrey verbalizes understanding of the findings of todays visit and agrees with plan of treatment. I have discussed any further diagnostic evaluation that may be needed or ordered today. We also reviewed his medications today. he has been encouraged to call the office with any questions or concerns that should arise related to todays visit.   Orders Placed This Encounter  Procedures  . POCT HgB A1C  . ECHOCARDIOGRAM COMPLETE    Time spent: 25 Minutes  Dr Lavera Guise Internal medicine

## 2019-03-05 ENCOUNTER — Telehealth: Payer: Self-pay | Admitting: Psychiatry

## 2019-03-05 NOTE — Telephone Encounter (Signed)
Returned call to Dr.Khan  She wants him off of seroquel and Remeron since his blood sugar levels are uncontrolled. Discussed that patient was already on all these medications when he came to see writer and wanted to stay on it due to past side effects and multiple med trials that he failed .  He does have appt with writer scheduled to discuss med changes.

## 2019-03-12 ENCOUNTER — Other Ambulatory Visit: Payer: Self-pay

## 2019-03-12 ENCOUNTER — Encounter: Payer: Self-pay | Admitting: Psychiatry

## 2019-03-12 ENCOUNTER — Ambulatory Visit (INDEPENDENT_AMBULATORY_CARE_PROVIDER_SITE_OTHER): Payer: Medicare Other | Admitting: Psychiatry

## 2019-03-12 DIAGNOSIS — F5105 Insomnia due to other mental disorder: Secondary | ICD-10-CM | POA: Diagnosis not present

## 2019-03-12 DIAGNOSIS — F331 Major depressive disorder, recurrent, moderate: Secondary | ICD-10-CM

## 2019-03-12 DIAGNOSIS — F4310 Post-traumatic stress disorder, unspecified: Secondary | ICD-10-CM | POA: Diagnosis not present

## 2019-03-12 MED ORDER — MIRTAZAPINE 15 MG PO TABS: 15.0000 mg | ORAL_TABLET | Freq: Every day | ORAL | 1 refills | Status: DC

## 2019-03-12 MED ORDER — ESCITALOPRAM OXALATE 10 MG PO TABS: 10.0000 mg | ORAL_TABLET | Freq: Every day | ORAL | 1 refills | Status: DC

## 2019-03-12 MED ORDER — TEMAZEPAM 7.5 MG PO CAPS: 7.5000 mg | ORAL_CAPSULE | Freq: Every evening | ORAL | 0 refills | Status: DC | PRN

## 2019-03-12 NOTE — Progress Notes (Signed)
Virtual Visit via Video Note  I connected with Raymond [name] on 03/12/19 at  2:00 PM EST by a video enabled telemedicine application and verified that I am speaking with the correct person using two identifiers.   I discussed the limitations of evaluation and management by telemedicine and the availability of in person appointments. The patient expressed understanding and agreed to proceed.     I discussed the assessment and treatment plan with the patient. The patient was provided an opportunity to ask questions and all were answered. The patient agreed with the plan and demonstrated an understanding of the instructions.   The patient was advised to call back or seek an in-person evaluation if the symptoms worsen or if the condition fails to improve as anticipated.   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  Patient today reports he is currently struggling with depressive symptoms, his sister recently passed away.  He is currently grieving.  There has been multiple other deaths in the family recently.  He does have a therapist and is motivated to start more frequent psychotherapy sessions.  He reports sleep continues to be restless.  Patient with uncontrolled diabetes mellitus, per primary care provider, his Seroquel and mirtazapine may also be contributing to his uncontrolled diabetes.  Patient was reluctant to change his medications previously since this is the only medication which may have worked for him.  Discussed with patient about changing his medication so that his blood sugar level can be more under control.  Discussed  discontinuing Seroquel and reducing the dosage of mirtazapine.  Also discussed adding Lexapro for his depression.  He has not been on it before.  Also discussed adding a new sleep medication-temazepam.  Patient reports he continues to have intrusive memories of his trauma from his childhood.  This does affect his mood.  Patient denies any suicidality, homicidality or perceptual disturbances.  Patient denies any other concerns today. Visit Diagnosis:    ICD-10-CM   1. PTSD (post-traumatic stress disorder)  F43.10 escitalopram (LEXAPRO) 10 MG tablet    mirtazapine (REMERON) 15 MG tablet  2. MDD (major depressive disorder), recurrent episode, moderate (HCC)  F33.1 escitalopram (LEXAPRO) 10 MG tablet    mirtazapine (REMERON) 15 MG tablet  3. Insomnia due to mental disorder  F51.05 mirtazapine (REMERON) 15 MG tablet    temazepam (RESTORIL) 7.5 MG capsule    Past Psychiatric History: I have reviewed past psychiatric history from my progress note on 05/23/2018.  Past trials of Prozac, Zoloft, Celexa, Tegretol, doxepin, Klonopin, Seroquel, mirtazapine, Lunesta, Ambien  Past Medical History:  Past Medical History:  Diagnosis Date  . Anxiety   . Arthritis    NECK  . Bronchitis   . Depression   . Diabetes mellitus without complication ([place])   . 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 Dyspnea    DUE TO GALLBLADDER PER PT  . Family history of adverse reaction to anesthesia    N/V, TROUBLE BREATHING COMING OUT OF ANESTHESIA  . Fatty liver   . GERD (gastroesophageal reflux disease)   . Headache    MIGRAINES  . Hyperlipidemia   . Hypertension   . Irregular  heart beat unk  . Myocardial infarction ([place]) 2010   MILD   . PTSD (post-traumatic stress disorder)   . Sleep apnea    USES CPAP  . Tachycardia     Past Surgical History:  Procedure Laterality Date  .  ingrown toenail removal    . CHOLECYSTECTOMY N/A 01/23/2019   Procedure: LAPAROSCOPIC CHOLECYSTECTOMY;  Surgeon: Olean Ree,  MD;  Location: [facility] ORS;  Service: General;  Laterality: N/A;  . CLAVICLE SURGERY Right   . COLONOSCOPY  2017  . MOUTH SURGERY     REMOVED ALL TEETH  . SHOULDER SURGERY Right   . UPPER GI ENDOSCOPY  2017    Family Psychiatric History: Reviewed family psychiatric history from my progress note on 05/23/2018.  Family History:  Family History  Problem Relation Age of Onset  . Hypertension Mother   . Diabetes type II Mother   . Diabetes Mother   . COPD Father   . Cancer Father   . Heart disease Father   . Diabetes Sister   . Anxiety disorder Sister   . Depression Sister   . Diabetes Brother   . Anxiety disorder Brother   . Depression Brother   . Diabetes Maternal Aunt   . Diabetes Sister   . Anxiety disorder Sister   . Depression Sister   . Diabetes Sister   . Anxiety disorder Sister   . Depression Sister   . Diabetes Sister   . Anxiety disorder Sister   . Depression Sister   . Colon cancer Maternal Uncle     Social History: Reviewed social history from my progress note on 05/23/2018. Social History   Socioeconomic History  . Marital status: Divorced    Spouse name: Not on file  . Number of children: 4  . Years of education: Not on file  . Highest education level: 8th grade  Occupational History  . Not on file  Social Needs  . Financial resource strain: Very hard  . Food insecurity    Worry: Often true    Inability: Often true  . Transportation needs    Medical: No    Non-medical: No  Tobacco Use  . Smoking status: Current Every Day Smoker    Packs/day: 2.00    Years: 40.00    Pack years: 80.00    Types: Cigarettes  . Smokeless tobacco: Former Systems developer    Types: Chew  . Tobacco comment: Reports has tried multiple things to help quit and cut back.   Substance and Sexual Activity  . Alcohol use: No    Alcohol/week: 0.0 standard drinks    Frequency: Never  . Drug use: No  . Sexual activity: Not Currently  Lifestyle  . Physical activity    Days per week: 0  days    Minutes per session: 0 min  . Stress: Very much  Relationships  . Social Herbalist on phone: Not on file    Gets together: Not on file    Attends religious service: Never    Active member of club or organization: No    Attends meetings of clubs or organizations: Never    Relationship status: Divorced  Other Topics Concern  . Not on file  Social History Narrative  . Not on file    Allergies:  Allergies  Allergen Reactions  . Onion Anaphylaxis  . Hydrocodone   . Norco [Hydrocodone-Acetaminophen] Itching  . Hydrocodone-Acetaminophen Itching  . Nickel Rash    Metabolic Disorder Labs: Lab Results  Component Value Date   HGBA1C 11.7 (A) 03/03/2019   MPG 294.83 05/31/2017   No results found for: PROLACTIN Lab Results  Component Value Date   CHOL 134 07/15/2015   TRIG 227 (H) 07/15/2015   HDL 28 (L) 07/15/2015   CHOLHDL 4.8 07/15/2015   VLDL 26 12/09/2012   LDLCALC 61 07/15/2015   LDLCALC 96 12/09/2012   Lab Results  Component Value Date   TSH 4.120 06/10/2018   TSH 2.000 07/15/2015    Therapeutic Level Labs: No results found for: LITHIUM No results found for: VALPROATE No components found for:  CBMZ  Current Medications: Current Outpatient Medications  Medication Sig Dispense Refill  . ACCU-CHEK FASTCLIX LANCETS MISC yse as directed twice a day 100 each 1  . albuterol (PROAIR HFA) 108 (90 Base) MCG/ACT inhaler Inhale 2-4 puffs by mouth Every 4-6 hours as needed for wheezing, cough, and/or shortness of breath 3 Inhaler 1  . aspirin EC 81 MG tablet Take 81 mg by mouth daily at 3 pm.     . famotidine (PEPCID) 20 MG tablet Take 1 tablet (20 mg total) by mouth 2 (two) times daily. 60 tablet 3  . gabapentin (NEURONTIN) 100 MG capsule Take 1 capsule (100 mg total) by mouth 3 (three) times daily. 90 capsule 3  . glucose blood (ACCU-CHEK AVIVA PLUS) test strip Blood sugar testing TID and as needed. E11.65 100 each 12  . Insulin Glargine, 2 Unit Dial,  (TOUJEO MAX SOLOSTAR) 300 UNIT/ML SOPN Inject 60 Units into the skin daily. (Patient taking differently: Inject 64 Units into the skin at bedtime. ) 3 pen 5  . insulin lispro (HUMALOG KWIKPEN) 100 UNIT/ML KwikPen Use as directed per  sliding scale max units 8-10 per meal (Patient taking differently: Inject 12-15 Units into the skin 3 (three) times daily between meals. Sliding Scale Insulin) 15 mL 2  . metoprolol tartrate (LOPRESSOR) 100 MG tablet Take 1 tablet (100 mg total) by mouth 2 (two) times daily. 180 tablet 0  . nitroGLYCERIN (NITROSTAT) 0.4 MG SL tablet Place 0.4 mg under the tongue every 5 (five) minutes x 3 doses as needed for chest pain.     . pantoprazole (PROTONIX) 40 MG tablet Take 1 tablet (40 mg total) by mouth daily. (Patient taking differently: Take 40 mg by mouth every morning. ) 30 tablet 5  . sitaGLIPtin-metformin (JANUMET) 50-500 MG tablet Take 1 tablet by mouth 2 (two) times daily. 180 tablet 1  . dicyclomine (BENTYL) 10 MG capsule Take 1 capsule (10 mg total) by mouth 4 (four) times daily -  before meals and at bedtime for 30 doses. (Patient not taking: Reported on 03/12/2019) 30 capsule 0  . escitalopram (LEXAPRO) 10 MG tablet Take 1 tablet (10 mg total) by mouth daily with breakfast. 30 tablet 1  . ibuprofen (ADVIL) 600 MG tablet Take 1 tablet (600 mg total) by mouth every 8 (eight) hours as needed for mild pain or moderate pain. (Patient not taking: Reported on 03/12/2019) 30 tablet 0  . mirtazapine (REMERON) 15 MG tablet Take 1 tablet (15 mg total) by mouth at bedtime. 30 tablet 1  . temazepam (RESTORIL) 7.5 MG capsule Take 1 capsule (7.5 mg total) by mouth at bedtime as needed for sleep. 30 capsule 0   No current facility-administered medications for this visit.      Musculoskeletal: Strength & Muscle Tone: UTA Gait & Station: normal Patient leans: N/A  Psychiatric Specialty Exam: Review of Systems  Psychiatric/Behavioral: Positive for depression.  The patient is  nervous/anxious and has insomnia.   All other systems reviewed and are negative.   There were no vitals taken for this visit.There is no height or weight on file to calculate BMI.  General Appearance: Casual  Eye Contact:  Fair  Speech:  Clear and Coherent  Volume:  Normal  Mood:  Anxious and Depressed  Affect:  Congruent  Thought Process:  Goal Directed and Descriptions of Associations: Intact  Orientation:  Full (Time, Place, and Person)  Thought Content: Logical   Suicidal Thoughts:  No  Homicidal Thoughts:  No  Memory:  Immediate;   Fair Recent;   Fair Remote;   Fair  Judgement:  Fair  Insight:  Fair  Psychomotor Activity:  Normal  Concentration:  Concentration: Fair and Attention Span: Fair  Recall:  [employer] of Knowledge: Fair  Language: Fair  Akathisia:  No  Handed:  Right  AIMS (if indicated): Denies tremors, rigidity  Assets:  Communication Skills Desire for [place] Talents/Skills  ADL's:  Intact  Cognition: WNL  Sleep:  Poor   Screenings: Mini-Mental     Clinical Support from 10/08/2018 in [facility], [place] from 10/05/2017 in [facility], [facility]  Total Score (max 30 points )  27  30    PHQ2-9     Office Visit from 03/03/2019 in [facility], [facility] Office Visit from 01/16/2019 in [facility], [facility] Office Visit from 11/15/2018 in [facility], [place] from 10[facility]08/2018 in [facility], [facility] Office Visit from 07/04/2018 in [facility], [facility]  PHQ-2 Total Score  0  0  0  0  0       Assessment and Plan: Zecharia is a 53 year old Caucasian male, disabled, lives in [place], divorced, has a history of PTSD, MDD, insomnia, uncontrolled diabetes mellitus, 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  Patient is biologically predisposed given his multiple medical problems, history of trauma.  He  also has psychosocial stressors of recent death of his sister, as well as multiple other deaths in the family, his own health issues, pandemic.  Patient currently with uncontrolled diabetes and may need medication readjustment since his psychotropic medications may also be contributing to the same.  Plan as noted below.  Plan MDD-unstable Reduce mirtazapine to 15 mg p.o. nightly Start Lexapro 10 mg p.o. daily Discontinue Seroquel.   PTSD-some progress Continue CBT Lexapro will help  Insomnia-unstable Discontinue trazodone. Discontinue Seroquel. Continue CPAP for OSA Start temazepam 7.5 mg p.o. nightly  Patient encouraged to restart more frequent psychotherapy sessions.  I have communicated with his primary care provider Dr. Aggie Moats with uncontrolled diabetes mellitus likely his psychotropic medications are contributing to the same 2.  Follow-up in clinic in 2 weeks or sooner if needed.  November 30 at 10:15 AM  I have spent atleast 25 minutes non  face to face with patient today. More than 50 % of the time was spent for psychoeducation and supportive psychotherapy and care coordination. This note was generated in part or whole with voice recognition software. Voice recognition is usually quite accurate but there are transcription errors that can and very often do occur. I apologize for any typographical errors that were not detected and corrected.       Ursula Alert, MD 03/12/2019, 5:14 PM

## 2019-03-13 ENCOUNTER — Telehealth: Payer: Self-pay | Admitting: Psychiatry

## 2019-03-13 DIAGNOSIS — F5105 Insomnia due to other mental disorder: Secondary | ICD-10-CM

## 2019-03-13 DIAGNOSIS — F4310 Post-traumatic stress disorder, unspecified: Secondary | ICD-10-CM

## 2019-03-13 DIAGNOSIS — F331 Major depressive disorder, recurrent, moderate: Secondary | ICD-10-CM

## 2019-03-13 MED ORDER — RAMELTEON 8 MG PO TABS: 8.0000 mg | ORAL_TABLET | Freq: Every day | ORAL | 0 refills | Status: DC

## 2019-03-13 NOTE — Telephone Encounter (Signed)
Called patient to discuss that his temazepam which was prescribed yesterday for sleep was not approved by his health insurance plan.  Rozerem, trazodone and Belsomra are on the preference list.  He has already tried and failed trazodone and Belsomra.  We will send Rozerem to the pharmacy today.  Advised patient to stop trazodone once he starts the Rozerem.  He will continue the mirtazapine at low dosage at this time.  Patient advised to call the clinic back in 10 days if he continues to not sleep well.

## 2019-03-19 ENCOUNTER — Ambulatory Visit (INDEPENDENT_AMBULATORY_CARE_PROVIDER_SITE_OTHER): Payer: Medicare Other | Admitting: Gastroenterology

## 2019-03-19 ENCOUNTER — Encounter: Payer: Self-pay | Admitting: Gastroenterology

## 2019-03-19 ENCOUNTER — Telehealth: Payer: Self-pay | Admitting: Psychiatry

## 2019-03-19 DIAGNOSIS — K760 Fatty (change of) liver, not elsewhere classified: Secondary | ICD-10-CM

## 2019-03-19 DIAGNOSIS — F4310 Post-traumatic stress disorder, unspecified: Secondary | ICD-10-CM

## 2019-03-19 DIAGNOSIS — F5105 Insomnia due to other mental disorder: Secondary | ICD-10-CM

## 2019-03-19 MED ORDER — TEMAZEPAM 15 MG PO CAPS: 15.0000 mg | ORAL_CAPSULE | Freq: Every evening | ORAL | 0 refills | Status: DC | PRN

## 2019-03-19 NOTE — Telephone Encounter (Signed)
Returned call to patient.  Received a message from Alexander at front desk that patient continues to struggle with sleep.  Patient reports that the Rozerem helped him to sleep the first night however he continues to struggle with sleep and is unable to get a good night sleep at this time.  Patient tried and failed trazodone, Belsomra, Rozerem, mirtazapine.  We will send temazepam to the pharmacy again.  It was declined last time and his insurance plan wanted him to try Belsomra, Rozerem or trazodone which he already has failed.

## 2019-03-19 NOTE — Progress Notes (Signed)
Raymond Antigua, MD [address]  [place]  [place], [place] [address]  Main: [phone]  Fax: [phone]   Primary Care Physician: Ronnell Freshwater, NP  Virtual Visit via Telephone Note  I connected with patient on 03/19/19 at  2:15 PM EST by telephone and verified that I am speaking with the correct person using two identifiers.   I discussed the limitations, risks, security and privacy concerns of performing an evaluation and management service by telephone and the availability of in person appointments. I also discussed with the patient that there may be a patient responsible charge related to this service. The patient expressed understanding and agreed to proceed.  Location of Patient: Home Location of Provider: Home Persons involved: Patient and provider only during the visit (nursing staff and front desk staff was involved in communicating with the patient prior to the appointment, reviewing medications and checking them in)   History of Present Illness: Chief Complaint  Patient presents with  . Abdominal Pain    Patient states this has improved. Patient states she states he has rectal bleeding 2 days ago      HPI: Raymond [name] is a 53 y.o. male here for follow-up of rectal bleeding from hemorrhoids, status post banding by Dr. Marius Ditch.  Patient has had good results since the banding with improvement in blood per rectum.  States only had one episode of bleeding since the banding, with but not otherwise.  Also underwent cholecystectomy about a month ago and states abdominal pain has resolved since then.  Is having 1 formed bowel movement daily without straining.  Patient has had previous work-up with EGD and colonoscopy in 2017 at [facility]. At that time he reported hematochezia as well. 4 subcentimeter polyps were removed and were hyperplastic. Internal nonbleeding hemorrhoids were reported. EGD showed grade B reflux esophagitis. Esophageal mucosal changes  suspicious for Barrett's were also reported. Pathology report showed no intestinal metaplasia. 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  Current Outpatient Medications  Medication Sig Dispense Refill  . ACCU-CHEK FASTCLIX LANCETS MISC yse as directed twice a day 100 each 1  . albuterol (PROAIR HFA) 108 (90 Base) MCG/ACT inhaler Inhale 2-4 puffs by mouth Every 4-6 hours as needed for wheezing, cough, and/or shortness of breath 3 Inhaler 1  . aspirin EC 81 MG tablet Take 81 mg by mouth daily at 3 pm.     . escitalopram (LEXAPRO) 10 MG tablet Take 1 tablet (10 mg total) by mouth daily with breakfast. 30 tablet 1  . famotidine (PEPCID) 20 MG tablet Take 1 tablet (20 mg total) by mouth 2 (two) times daily. 60 tablet 3  . gabapentin (NEURONTIN) 100 MG capsule Take 1 capsule (100 mg total) by mouth 3 (three) times daily. 90 capsule 3  . glucose blood (ACCU-CHEK AVIVA PLUS) test strip Blood sugar testing TID and as needed. E11.65 100 each 12  . Insulin Glargine, 2 Unit Dial, (TOUJEO MAX SOLOSTAR) 300 UNIT/ML SOPN Inject 60 Units into the skin daily. (Patient taking differently: Inject 64 Units into the skin at bedtime. ) 3 pen 5  . insulin lispro (HUMALOG KWIKPEN) 100 UNIT/ML KwikPen Use as directed per  sliding scale max units 8-10 per meal (Patient taking differently: Inject 12-15 Units into the skin 3 (three) times daily between meals. Sliding Scale Insulin) 15 mL 2  . metoprolol tartrate (LOPRESSOR) 100 MG tablet Take 1 tablet (100 mg total) by mouth 2 (two) times daily. 180 tablet  0  . nitroGLYCERIN (NITROSTAT) 0.4 MG SL tablet Place 0.4 mg under the tongue every 5 (five) minutes x 3 doses as needed for chest pain.     . pantoprazole (PROTONIX) 40 MG tablet Take 1 tablet (40 mg total) by mouth daily. (Patient taking differently: Take 40 mg by mouth every morning. ) 30 tablet 5  . sitaGLIPtin-metformin (JANUMET) 50-500 MG tablet Take 1 tablet by mouth 2 (two) times daily. 180  tablet 1  . temazepam (RESTORIL) 15 MG capsule Take 1 capsule (15 mg total) by mouth at bedtime as needed for sleep. 30 capsule 0   No current facility-administered medications for this visit.     Allergies as of 03/19/2019 - Review Complete 03/19/2019  Allergen Reaction Noted  . Onion Anaphylaxis 03/30/2015  . Hydrocodone  01/13/2014  . Norco [hydrocodone-acetaminophen] Itching 11/02/2014  . Hydrocodone-acetaminophen Itching 11/02/2014  . Nickel Rash 01/20/2019    Review of Systems:    All systems reviewed and negative except where noted in HPI.   Observations/Objective:  Labs: CMP     Component Value Date/Time   NA 136 01/25/2019 0623   NA 138 11/13/2018 1442   NA 138 11/28/2013 1506   K 3.7 01/25/2019 0623   K 3.7 11/28/2013 1506   CL 101 01/25/2019 0623   CL 105 11/28/2013 1506   CO2 22 01/25/2019 0623   CO2 26 11/28/2013 1506   GLUCOSE 255 (H) 01/25/2019 0623   GLUCOSE 163 (H) 11/28/2013 1506   BUN 12 01/25/2019 0623   BUN 9 11/13/2018 1442   BUN 12 11/28/2013 1506   CREATININE 0.92 01/25/2019 0623   CREATININE 1.25 11/28/2013 1506   CALCIUM 8.7 (L) 01/25/2019 0623   CALCIUM 8.5 11/28/2013 1506   PROT 8.1 01/25/2019 0623   PROT 7.6 11/13/2018 1442   PROT 8.0 12/08/2012 0914   ALBUMIN 3.8 01/25/2019 0623   ALBUMIN 4.4 11/13/2018 1442   ALBUMIN 4.1 12/08/2012 0914   AST 52 (H) 01/25/2019 0623   AST 28 12/08/2012 0914   ALT 73 (H) 01/25/2019 0623   ALT 42 12/08/2012 0914   ALKPHOS 104 01/25/2019 0623   ALKPHOS 118 12/08/2012 0914   BILITOT 1.1 01/25/2019 0623   BILITOT <0.2 11/13/2018 1442   BILITOT 0.3 12/08/2012 0914   GFRNONAA >60 01/25/2019 0623   GFRNONAA >60 11/28/2013 1506   GFRAA >60 01/25/2019 0623   GFRAA >60 11/28/2013 1506   Lab Results  Component Value Date   WBC 11.9 (H) 01/25/2019   HGB 14.7 01/25/2019   HCT 43.1 01/25/2019   MCV 89.8 01/25/2019   PLT 189 01/25/2019    Imaging Studies: No results found.  Assessment and Plan:    Raymond [name] is a 53 y.o. y/o male being seen for follow-up of rectal bleeding from hemorrhoids and fatty liver  Assessment and Plan: Rectal bleeding resolved with hemorrhoid banding High-fiber diet discussed  No diarrhea since recent cholecystectomy  If symptoms change patient advised to call us  Elevated liver enzyme work-up has not shown anything except fatty liver  Finding of fatty liver on imaging discussed with patient Diet, weight loss, and exercise encouraged along with avoiding hepatotoxic drugs including alcohol Risk of progression to cirrhosis if above measures are not instituted were discussed as well, and patient verbalized understanding  Patient states he is working on weight loss  Denies any alcohol use  Follow Up Instructions: 1 year   I discussed the assessment and treatment plan with the patient. The patient  was provided an opportunity to ask questions and all were answered. The patient agreed with the plan and demonstrated an understanding of the instructions.   The patient was advised to call back or seek an in-person evaluation if the symptoms worsen or if the condition fails to improve as anticipated.  I provided 11 minutes of non-face-to-face time during this encounter. Additional time was spent in reviewing patient's chart, placing orders etc.   Virgel Manifold, MD  Speech recognition software was used to dictate this note.

## 2019-03-23 NOTE — Progress Notes (Signed)
Virtual Visit via Video Note  I connected with Raymond [name] on 03/24/19 at 10:15 AM EST by a video enabled telemedicine application and verified that I am speaking with the correct person using two identifiers.   I discussed the limitations of evaluation and management by telemedicine and the availability of in person appointments. The patient expressed understanding and agreed to proceed.    I discussed the assessment and treatment plan with the patient. The patient was provided an opportunity to ask questions and all were answered. The patient agreed with the plan and demonstrated an understanding of the instructions.   The patient was advised to call back or seek an in-person evaluation if the symptoms worsen or if the condition fails to improve as anticipated.  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 with his brother, was evaluated by telemedicine today.  Patient today reports he continues to struggle with depressive symptoms.  His nephew is currently hospitalized and that also makes him sad.  He is coping with his sister's recent death better than before.  He has upcoming appointment with therapist and he is motivated to keep it.  Patient reports he tried the temazepam few days ago.  The 15 mg does not help him much.  He continues to wake up several times and has a lot of racing thoughts at night.  Discussed sleep hygiene techniques.  He reports he has been using coke which has caffeine in it as well as tea towards the end of the day.  Patient reports he is compliant on Lexapro.  Denies side effects.  Patient denies any suicidality, homicidality or perceptual disturbances.  Patient  denies any other concerns today.     Visit Diagnosis:    ICD-10-CM   1. PTSD (post-traumatic stress disorder)  F43.10 temazepam (RESTORIL) 15 MG capsule  2. MDD (major depressive disorder), recurrent episode, moderate (HCC)  F33.1   3. Insomnia due to mental disorder  F51.05 temazepam (RESTORIL) 15 MG capsule    Past Psychiatric History: I have reviewed past psychiatric history from my progress note on 05/23/2018.  Past trials of Prozac, Zoloft, Celexa, Tegretol, doxepin, Klonopin, Seroquel, mirtazapine, Lunesta, Ambien, Belsomra, Rozerem.   Past Medical History:  Past Medical History:  Diagnosis Date  . Anxiety   . Arthritis    NECK  . Bronchitis   . Depression   . Diabetes mellitus without complication ([place])   . 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 Dyspnea    DUE TO GALLBLADDER PER PT  . Family history of adverse reaction to anesthesia    N/V, TROUBLE BREATHING COMING OUT OF ANESTHESIA  . Fatty liver   . GERD (gastroesophageal reflux disease)   . Headache    MIGRAINES  . Hyperlipidemia   . Hypertension   . Irregular heart beat unk  . Myocardial infarction ([place]) 2010   MILD   . PTSD (post-traumatic stress disorder)   . Sleep apnea    USES CPAP  . Tachycardia     Past Surgical History:  Procedure Laterality Date  .  ingrown toenail removal    . CHOLECYSTECTOMY N/A 01/23/2019   Procedure: LAPAROSCOPIC CHOLECYSTECTOMY;  Surgeon: Olean Ree, MD;  Location: [facility] ORS;  Service: General;  Laterality: N/A;  . CLAVICLE SURGERY Right   . COLONOSCOPY  2017  . MOUTH SURGERY     REMOVED ALL TEETH  . SHOULDER SURGERY Right   . UPPER GI ENDOSCOPY  2017    Family Psychiatric History: I have reviewed family psychiatric history from my progress note on 05/23/2018.  Family History:  Family History  Problem Relation Age of Onset  . Hypertension Mother   . Diabetes type II Mother   . Diabetes Mother   . COPD Father   . Cancer Father   . Heart disease Father   .  Diabetes Sister   . Anxiety disorder Sister   . Depression Sister   . Diabetes Brother   . Anxiety disorder Brother   . Depression Brother   . Diabetes Maternal Aunt   . Diabetes Sister   . Anxiety disorder Sister   . Depression Sister   . Diabetes Sister   . Anxiety disorder Sister   . Depression Sister   . Diabetes Sister   . Anxiety disorder Sister   . Depression Sister   . Colon cancer Maternal Uncle     Social History: Reviewed social history from my progress note on 05/23/2018. Social History   Socioeconomic History  . Marital status: Divorced    Spouse name: Not on file  . Number of children: 4  . Years of education: Not on file  . Highest education level: 8th grade  Occupational History  . Not on file  Social Needs  . Financial resource strain: Very hard  . Food insecurity    Worry: Often true    Inability: Often true  . Transportation needs    Medical: No    Non-medical: No  Tobacco Use  . Smoking status: Current Every Day Smoker    Packs/day: 2.00    Years: 40.00    Pack years: 80.00    Types: Cigarettes  . Smokeless tobacco: Former Systems developer    Types: Chew  . Tobacco comment: Reports has tried multiple things to help quit and cut back.   Substance and Sexual Activity  . Alcohol use: No    Alcohol/week: 0.0 standard drinks    Frequency: Never  . Drug use: No  . Sexual activity: Not Currently  Lifestyle  . Physical activity    Days per week: 0 days    Minutes per session: 0 min  . Stress: Very much  Relationships  . Social Herbalist on phone: Not on file    Gets together: Not on file    Attends religious service: Never    Active member of club or organization: No    Attends meetings of clubs or organizations: Never    Relationship status: Divorced  Other Topics Concern  . Not on file  Social History Narrative  . Not on file    Allergies:  Allergies  Allergen Reactions  . Onion Anaphylaxis  . Hydrocodone   . Norco  [Hydrocodone-Acetaminophen] Itching  . Hydrocodone-Acetaminophen Itching  . Nickel Rash    Metabolic Disorder Labs: Lab Results  Component Value Date   HGBA1C 11.7 (A) 03/03/2019   MPG 294.83 05/31/2017   No results found for: PROLACTIN Lab Results  Component Value Date   CHOL 134 07/15/2015   TRIG 227 (H) 07/15/2015   HDL 28 (L) 07/15/2015   CHOLHDL 4.8 07/15/2015   VLDL 26 12/09/2012   LDLCALC 61 07/15/2015   LDLCALC 96 12/09/2012   Lab Results  Component Value Date   TSH 4.120 06/10/2018   TSH 2.000 07/15/2015    Therapeutic Level Labs: No results found for: LITHIUM No results found for: VALPROATE No components found for:  CBMZ  Current Medications: Current Outpatient Medications  Medication Sig Dispense Refill  . dapagliflozin propanediol (FARXIGA) 10 MG TABS tablet Take 10 mg by mouth daily.    Marland Kitchen ACCU-CHEK FASTCLIX LANCETS MISC yse as directed twice a day 100 each 1  . albuterol (PROAIR HFA) 108 (90 Base) MCG/ACT inhaler Inhale 2-4 puffs by mouth Every 4-6 hours as needed for wheezing, cough, and/or shortness of breath 3 Inhaler 1  . aspirin EC 81 MG tablet Take 81 mg by mouth daily at 3 pm.     . escitalopram (LEXAPRO) 10 MG tablet Take 1 tablet (10 mg total) by mouth daily with breakfast. 30 tablet 1  . famotidine (PEPCID) 20 MG tablet Take 1 tablet (20 mg total) by mouth 2 (two) times daily. 60 tablet 3  . gabapentin (NEURONTIN) 100 MG capsule Take 1 capsule (100 mg total) by mouth 3 (three) times daily. 90 capsule 3  . glucose blood (ACCU-CHEK AVIVA PLUS) test strip Blood sugar testing TID and as needed. E11.65 100 each 12  . Insulin Glargine, 2 Unit Dial, (TOUJEO MAX SOLOSTAR) 300 UNIT/ML SOPN Inject 60 Units into the skin daily. (Patient taking differently: Inject 64 Units into the skin at bedtime. ) 3 pen 5  . insulin lispro (HUMALOG KWIKPEN) 100 UNIT/ML KwikPen Use as directed per  sliding scale max units 8-10 per meal (Patient taking differently: Inject  12-15 Units into the skin 3 (three) times daily between meals. Sliding Scale Insulin) 15 mL 2  . metoprolol tartrate (LOPRESSOR) 100 MG tablet Take 1 tablet (100 mg total) by mouth 2 (two) times daily. 180 tablet 0  . nitroGLYCERIN (NITROSTAT) 0.4 MG SL tablet Place 0.4 mg under the tongue every 5 (five) minutes x 3 doses as needed for chest pain.     . pantoprazole (PROTONIX) 40 MG tablet Take 1 tablet (40 mg total) by mouth daily. (Patient taking differently: Take 40 mg by mouth every morning. ) 30 tablet 5  . sitaGLIPtin-metformin (JANUMET) 50-500 MG tablet Take 1 tablet by mouth 2 (two) times daily. 180 tablet 1  . temazepam (RESTORIL) 15 MG capsule Take 2 capsules (30 mg total) by mouth at bedtime as needed for sleep. Has supplies 30 capsule 0   No current facility-administered medications for this visit.      Musculoskeletal: Strength & Muscle Tone: UTA Gait & Station: normal Patient leans: N/A  Psychiatric Specialty Exam: Review of Systems  Psychiatric/Behavioral: Positive for depression. The patient has insomnia.   All other systems reviewed and are negative.   There were no vitals taken for this visit.There is no height or weight on file to calculate BMI.  General Appearance: Casual  Eye Contact:  Fair  Speech:  Normal Rate  Volume:  Normal  Mood:  Depressed  Affect:  Congruent  Thought Process:  Goal Directed and Descriptions of Associations: Intact  Orientation:  Full (Time, Place, and Person)  Thought Content: Logical   Suicidal Thoughts:  No  Homicidal Thoughts:  No  Memory:  Immediate;   Fair Recent;   Fair Remote;   Fair  Judgement:  Fair  Insight:  Fair  Psychomotor Activity:  Normal  Concentration:  Concentration: Fair and Attention Span: Fair  Recall:  [employer] of Knowledge: Fair  Language: Fair  Akathisia:  No  Handed:  Right  AIMS (if indicated): Denies tremors, rigidity  Assets:  Communication Skills Desire for Improvement Housing Social Support   ADL's:  Intact  Cognition: WNL  Sleep:  Poor   Screenings: Mini-Mental     Clinical Support from 10/08/2018 in [facility], [facility] from 10/05/2017 in [facility], [facility]  Total Score (max 30 points )  27  30    PHQ2-9     Office Visit from 03/03/2019 in [facility], [facility] Office Visit from 01/16/2019 in [facility], [name] Office Visit from 11/15/2018 in [facility], [place] from 10/08/2018 in [facility], [facility] Office Visit from 07/04/2018 in [facility], [facility]  PHQ-2 Total Score  0  0  0  0  0       Assessment and Plan:   Rudi is a 53 year old Caucasian male, disabled, lives in [place], divorced, has a history of PTSD, MDD, insomnia, diabetes melitis, 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  Patient is biologically predisposed given his multiple medical problems, history of trauma.  Patient also has psychosocial stressors of recent death of his sister, multiple other deaths in the family, his own health issues and the current pandemic.  Patient continues to struggle with sleep issues although tolerating temazepam well.  Plan as noted below.  Plan MDD-unstable Mirtazapine at reduced dose to 15 mg p.o. nightly Lexapro 10 mg p.o. daily  PTSD-unstable Continue CBT Lexapro will help  Insomnia-unstable Continue CPAP for OSA Discussed sleep hygiene techniques like cutting back on caffeine as well as setting a good bedtime and wake up time. Increase temazepam to 30 mg p.o. nightly.   Follow-up in clinic in 3 to 4 weeks or sooner if needed.  December 21 at 1:20 PM  I have spent atleast 15 minutes non face to face with patient today. More than 50 % of the time was spent for psychoeducation and supportive psychotherapy and care coordination. This note was generated in part or whole with voice recognition software. Voice  recognition is usually quite accurate but there are transcription errors that can and very often do occur. I apologize for any typographical errors that were not detected and corrected.       Ursula Alert, MD 03/24/2019, 12:24 PM

## 2019-03-24 ENCOUNTER — Encounter: Payer: Self-pay | Admitting: Psychiatry

## 2019-03-24 ENCOUNTER — Ambulatory Visit (INDEPENDENT_AMBULATORY_CARE_PROVIDER_SITE_OTHER): Payer: Medicare Other | Admitting: Psychiatry

## 2019-03-24 ENCOUNTER — Other Ambulatory Visit: Payer: Self-pay

## 2019-03-24 DIAGNOSIS — F331 Major depressive disorder, recurrent, moderate: Secondary | ICD-10-CM | POA: Diagnosis not present

## 2019-03-24 DIAGNOSIS — F5105 Insomnia due to other mental disorder: Secondary | ICD-10-CM | POA: Diagnosis not present

## 2019-03-24 DIAGNOSIS — F4310 Post-traumatic stress disorder, unspecified: Secondary | ICD-10-CM

## 2019-03-24 MED ORDER — TEMAZEPAM 15 MG PO CAPS: 30.0000 mg | ORAL_CAPSULE | Freq: Every evening | ORAL | 0 refills | Status: DC | PRN

## 2019-03-31 ENCOUNTER — Ambulatory Visit (INDEPENDENT_AMBULATORY_CARE_PROVIDER_SITE_OTHER): Payer: Medicare Other | Admitting: Licensed Clinical Social Worker

## 2019-03-31 ENCOUNTER — Encounter: Payer: Self-pay | Admitting: Licensed Clinical Social Worker

## 2019-03-31 ENCOUNTER — Other Ambulatory Visit: Payer: Self-pay

## 2019-03-31 DIAGNOSIS — F331 Major depressive disorder, recurrent, moderate: Secondary | ICD-10-CM

## 2019-03-31 DIAGNOSIS — F4310 Post-traumatic stress disorder, unspecified: Secondary | ICD-10-CM | POA: Diagnosis not present

## 2019-03-31 NOTE — Progress Notes (Signed)
Virtual Visit via Telephone Note  I connected with [name] on 03/31/19 at  9:00 AM EST by telephone and verified that I am speaking with the correct person using two identifiers.   I discussed the limitations, risks, security and privacy concerns of performing an evaluation and management service by telephone and the availability of in person appointments. I also discussed with the patient that there may be a patient responsible charge related to this service. The patient expressed understanding and agreed to proceed.  I discussed the assessment and treatment plan with the patient. The patient was provided an opportunity to ask questions and all were answered. The patient agreed with the plan and demonstrated an understanding of the instructions.   The patient was advised to call back or seek an in-person evaluation if the symptoms worsen or if the condition fails to improve as anticipated.  I provided 35 minutes of non-face-to-face time during this encounter.   Alden Hipp, LCSW    THERAPIST PROGRESS NOTE  Session Time: 0900  Participation Level: Active  Behavioral Response: NeatAlertAnxious  Type of Therapy: Individual Therapy  Treatment Goals addressed: Coping  Interventions: Supportive  Summary: [name] is a 53 y.o. male who presents with continued symptoms related to his diagnosis. Denali reports doing "not great," since our last session. Raymond [name] reports, since we last talked, his sister and nephew both passed away. He reported his sister got COVID in the nursing home where she lives, and passed away from that. He reports his nephew had a massive heart attack in his sleep and then passed away. Raymond [name] reports feeling these deaths have piled on an already bad year, and has forced him to think about his own death. "Everytime my throat hurts a little, I worry that I've gotten the COVID." LCSW validated these feelings, and normalized feeling dysregulated after  losing two family members. We discussed grief, and how there is no right or wrong way to grieve. LCSW shared a grief card deck via email that Raymond [name] could utilize in moments of sadness around those losses. Raymond [name] went on to report his anxiety has been "off the charts." We discussed CBT again and how Raymond [name] could utilize those skills to challenge negative thoughts in the moment. LCSW also shared a few worksheets via email that Raymond [name] could utilize between sessions. Raymond [name] expressed understanding and agreement with this information as well.   Suicidal/Homicidal: No  Therapist Response: Raymond [name] continues to work towards her tx goals but has not yet reached them. We will continue to work on emotional regulation skills and improving distress tolerance moving forward. We will also work on processing grief as it comes up.   Plan: Return again in 4 weeks.  Diagnosis: Axis I: Post Traumatic Stress Disorder    Axis II: No diagnosis    Alden Hipp, LCSW 03/31/2019

## 2019-04-07 ENCOUNTER — Telehealth: Payer: Self-pay

## 2019-04-07 DIAGNOSIS — F331 Major depressive disorder, recurrent, moderate: Secondary | ICD-10-CM

## 2019-04-07 DIAGNOSIS — F5105 Insomnia due to other mental disorder: Secondary | ICD-10-CM

## 2019-04-07 DIAGNOSIS — F4310 Post-traumatic stress disorder, unspecified: Secondary | ICD-10-CM

## 2019-04-07 MED ORDER — TEMAZEPAM 30 MG PO CAPS: 30.0000 mg | ORAL_CAPSULE | Freq: Every evening | ORAL | 0 refills | Status: DC | PRN

## 2019-04-07 NOTE — Telephone Encounter (Signed)
  Pt needs a refill on medication pt only received a 15 day supply pt take medication bid so he needs #60     temazepam (RESTORIL) 15 MG capsule Medication Date: 03/24/2019 Department: [facility] Ordering/Authorizing: Ursula Alert, MD  Order Providers  Prescribing Provider Encounter Provider  Ursula Alert, MD Ursula Alert, MD  Outpatient Medication Detail   Disp Refills Start End   temazepam (RESTORIL) 15 MG capsule 30 capsule 0 03/24/2019    Sig - Route: Take 2 capsules (30 mg total) by mouth at bedtime as needed for sleep. Has supplies - Oral   Class: No Print   Notes to Pharmacy: Patient failed Rozerem,trazodone,belsomra   Associated Diagnoses  PTSD (post-traumatic stress disorder) - Primary     Insomnia due to mental disorder     Pharmacy  [facility], [place]

## 2019-04-07 NOTE — Telephone Encounter (Signed)
Sent Temazepam 30 mg to pharmacy.

## 2019-04-09 ENCOUNTER — Telehealth: Payer: Self-pay

## 2019-04-09 NOTE — Telephone Encounter (Signed)
Confirmed appointment with patient. klh °

## 2019-04-11 ENCOUNTER — Ambulatory Visit: Payer: Medicare Other

## 2019-04-11 ENCOUNTER — Other Ambulatory Visit: Payer: Self-pay

## 2019-04-11 ENCOUNTER — Telehealth: Payer: Self-pay

## 2019-04-11 DIAGNOSIS — R0602 Shortness of breath: Secondary | ICD-10-CM

## 2019-04-11 DIAGNOSIS — R0609 Other forms of dyspnea: Secondary | ICD-10-CM

## 2019-04-11 DIAGNOSIS — E1165 Type 2 diabetes mellitus with hyperglycemia: Secondary | ICD-10-CM

## 2019-04-11 NOTE — Telephone Encounter (Signed)
Confirmed appointment with patient. klh °

## 2019-04-14 ENCOUNTER — Encounter: Payer: Self-pay | Admitting: Psychiatry

## 2019-04-14 ENCOUNTER — Other Ambulatory Visit: Payer: Self-pay

## 2019-04-14 ENCOUNTER — Ambulatory Visit (INDEPENDENT_AMBULATORY_CARE_PROVIDER_SITE_OTHER): Payer: Medicare Other | Admitting: Psychiatry

## 2019-04-14 DIAGNOSIS — F17200 Nicotine dependence, unspecified, uncomplicated: Secondary | ICD-10-CM

## 2019-04-14 DIAGNOSIS — F4310 Post-traumatic stress disorder, unspecified: Secondary | ICD-10-CM

## 2019-04-14 DIAGNOSIS — F5105 Insomnia due to other mental disorder: Secondary | ICD-10-CM

## 2019-04-14 DIAGNOSIS — F331 Major depressive disorder, recurrent, moderate: Secondary | ICD-10-CM

## 2019-04-14 MED ORDER — MIRTAZAPINE 7.5 MG PO TABS: 7.5000 mg | ORAL_TABLET | Freq: Every day | ORAL | 1 refills | Status: DC

## 2019-04-14 MED ORDER — ESCITALOPRAM OXALATE 10 MG PO TABS: 15.0000 mg | ORAL_TABLET | Freq: Every day | ORAL | 1 refills | Status: DC

## 2019-04-14 MED ORDER — PANTOPRAZOLE SODIUM 40 MG PO TBEC: 40.0000 mg | DELAYED_RELEASE_TABLET | Freq: Every day | ORAL | 5 refills | Status: DC

## 2019-04-14 NOTE — Progress Notes (Signed)
Virtual Visit via Video Note  I connected with Raymond [name] on 04/14/19 at  1:20 PM EST by a video enabled telemedicine application and verified that I am speaking with the correct person using two identifiers.   I discussed the limitations of evaluation and management by telemedicine and the availability of in person appointments. The patient expressed understanding and agreed to proceed.    I discussed the assessment and treatment plan with the patient. The patient was provided an opportunity to ask questions and all were answered. The patient agreed with the plan and demonstrated an understanding of the instructions.   The patient was advised to call back or seek an in-person evaluation if the symptoms worsen or if the condition fails to improve as anticipated.   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  Patient reports that one of his relatives passed away and he just got back from his funeral.  Patient reports all these back-to-back deaths in the family has been making him more and more depressed.  Patient currently struggles with sadness.  Patient reports sleep is improving to some extent on the temazepam.  Patient however reports he continues to have difficulty maintaining sleep.  Patient reports he has lost some weight and is currently on a new medication for diabetes.  He is hoping that will help manage his diabetes better.  Patient reports he currently does not have any side effects to the Lexapro.  Patient denies any suicidality, homicidality or perceptual disturbances.  Patient continues to work with his therapist and reports  therapy sessions is helpful.  Patient denies any other concerns today. Visit Diagnosis:    ICD-10-CM   1. PTSD (post-traumatic stress disorder)  F43.10 mirtazapine (REMERON) 7.5 MG tablet    escitalopram (LEXAPRO) 10 MG tablet  2. MDD (major depressive disorder), recurrent episode, moderate (HCC)  F33.1 mirtazapine (REMERON) 7.5 MG tablet    escitalopram (LEXAPRO) 10 MG tablet  3. Insomnia due to mental disorder  F51.05 mirtazapine (REMERON) 7.5 MG tablet  4. Tobacco use disorder  F17.200     Past Psychiatric History: Reviewed past psychiatric history from my progress note on 05/23/2018.  Past trials of Prozac , Zoloft, Celexa, Tegretol, doxepin, Klonopin, Seroquel, mirtazapine, Lunesta, Ambien, Belsomra, Rozerem.  Past Medical History:  Past Medical History:  Diagnosis Date  . Anxiety   . Arthritis    NECK  . Bronchitis   . Depression   . Diabetes mellitus without complication ([place])   . 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 Dyspnea    DUE TO GALLBLADDER PER PT  . Family history of adverse reaction to anesthesia    N/V, TROUBLE BREATHING COMING OUT OF ANESTHESIA  . Fatty liver   . GERD (gastroesophageal reflux disease)   . Headache    MIGRAINES  . Hyperlipidemia   . Hypertension   . Irregular heart beat unk  . Myocardial infarction ([place]) 2010   MILD   . PTSD (post-traumatic stress disorder)   . Sleep apnea    USES CPAP  . Tachycardia     Past Surgical History:  Procedure Laterality Date  .  ingrown toenail removal    .  CHOLECYSTECTOMY N/A 01/23/2019   Procedure: LAPAROSCOPIC CHOLECYSTECTOMY;  Surgeon: Olean Ree, MD;  Location: [facility] ORS;  Service: General;  Laterality: N/A;  . CLAVICLE SURGERY Right   . COLONOSCOPY  2017  . MOUTH SURGERY     REMOVED ALL TEETH  . SHOULDER SURGERY Right   . UPPER GI ENDOSCOPY  2017    Family Psychiatric History: Reviewed family psychiatric history from my progress note on 05/23/2018.  Family History:  Family History   Problem Relation Age of Onset  . Hypertension Mother   . Diabetes type II Mother   . Diabetes Mother   . COPD Father   . Cancer Father   . Heart disease Father   . Diabetes Sister   . Anxiety disorder Sister   . Depression Sister   . Diabetes Brother   . Anxiety disorder Brother   . Depression Brother   . Diabetes Maternal Aunt   . Diabetes Sister   . Anxiety disorder Sister   . Depression Sister   . Diabetes Sister   . Anxiety disorder Sister   . Depression Sister   . Diabetes Sister   . Anxiety disorder Sister   . Depression Sister   . Colon cancer Maternal Uncle     Social History: Reviewed social history from my progress note on 05/23/2018. Social History   Socioeconomic History  . Marital status: Divorced    Spouse name: Not on file  . Number of children: 4  . Years of education: Not on file  . Highest education level: 8th grade  Occupational History  . Not on file  Tobacco Use  . Smoking status: Current Every Day Smoker    Packs/day: 2.00    Years: 40.00    Pack years: 80.00    Types: Cigarettes  . Smokeless tobacco: Former Systems developer    Types: Chew  . Tobacco comment: Reports has tried multiple things to help quit and cut back.   Substance and Sexual Activity  . Alcohol use: No    Alcohol/week: 0.0 standard drinks  . Drug use: No  . Sexual activity: Not Currently  Other Topics Concern  . Not on file  Social History Narrative  . Not on file   Social Determinants of Health   Financial Resource Strain: High Risk  . Difficulty of Paying Living Expenses: Very hard  Food Insecurity: Food Insecurity Present  . Worried About Charity fundraiser in the Last Year: Often true  . Ran Out of Food in the Last Year: Often true  Transportation Needs: No Transportation Needs  . Lack of Transportation (Medical): No  . Lack of Transportation (Non-Medical): No  Physical Activity: Inactive  . Days of Exercise per Week: 0 days  . Minutes of Exercise per Session: 0 min   Stress: Stress Concern Present  . Feeling of Stress : Very much  Social Connections: Unknown  . Frequency of Communication with Friends and Family: Not on file  . Frequency of Social Gatherings with Friends and Family: Not on file  . Attends Religious Services: Never  . Active Member of Clubs or Organizations: No  . Attends Archivist Meetings: Never  . Marital Status: Divorced    Allergies:  Allergies  Allergen Reactions  . Onion Anaphylaxis  . Hydrocodone   . Norco [Hydrocodone-Acetaminophen] Itching  . Hydrocodone-Acetaminophen Itching  . Nickel Rash    Metabolic Disorder Labs: Lab Results  Component Value Date   HGBA1C 11.7 (A) 03/03/2019   MPG 294.83 05/31/2017  No results found for: PROLACTIN Lab Results  Component Value Date   CHOL 134 07/15/2015   TRIG 227 (H) 07/15/2015   HDL 28 (L) 07/15/2015   CHOLHDL 4.8 07/15/2015   VLDL 26 12/09/2012   LDLCALC 61 07/15/2015   LDLCALC 96 12/09/2012   Lab Results  Component Value Date   TSH 4.120 06/10/2018   TSH 2.000 07/15/2015    Therapeutic Level Labs: No results found for: LITHIUM No results found for: VALPROATE No components found for:  CBMZ  Current Medications: Current Outpatient Medications  Medication Sig Dispense Refill  . ACCU-CHEK FASTCLIX LANCETS MISC yse as directed twice a day 100 each 1  . albuterol (PROAIR HFA) 108 (90 Base) MCG/ACT inhaler Inhale 2-4 puffs by mouth Every 4-6 hours as needed for wheezing, cough, and/or shortness of breath 3 Inhaler 1  . aspirin EC 81 MG tablet Take 81 mg by mouth daily at 3 pm.     . dapagliflozin propanediol (FARXIGA) 10 MG TABS tablet Take 10 mg by mouth daily.    Marland Kitchen escitalopram (LEXAPRO) 10 MG tablet Take 1.5 tablets (15 mg total) by mouth daily with breakfast. 45 tablet 1  . famotidine (PEPCID) 20 MG tablet Take 1 tablet (20 mg total) by mouth 2 (two) times daily. 60 tablet 3  . gabapentin (NEURONTIN) 100 MG capsule Take 1 capsule (100 mg total)  by mouth 3 (three) times daily. 90 capsule 3  . glucose blood (ACCU-CHEK AVIVA PLUS) test strip Blood sugar testing TID and as needed. E11.65 100 each 12  . Insulin Glargine, 2 Unit Dial, (TOUJEO MAX SOLOSTAR) 300 UNIT/ML SOPN Inject 60 Units into the skin daily. (Patient taking differently: Inject 64 Units into the skin at bedtime. ) 3 pen 5  . insulin lispro (HUMALOG KWIKPEN) 100 UNIT/ML KwikPen Use as directed per  sliding scale max units 8-10 per meal (Patient taking differently: Inject 12-15 Units into the skin 3 (three) times daily between meals. Sliding Scale Insulin) 15 mL 2  . metoprolol tartrate (LOPRESSOR) 100 MG tablet Take 1 tablet (100 mg total) by mouth 2 (two) times daily. 180 tablet 0  . mirtazapine (REMERON) 7.5 MG tablet Take 1 tablet (7.5 mg total) by mouth at bedtime. 30 tablet 1  . nitroGLYCERIN (NITROSTAT) 0.4 MG SL tablet Place 0.4 mg under the tongue every 5 (five) minutes x 3 doses as needed for chest pain.     . pantoprazole (PROTONIX) 40 MG tablet Take 1 tablet (40 mg total) by mouth daily. 30 tablet 5  . sitaGLIPtin-metformin (JANUMET) 50-500 MG tablet Take 1 tablet by mouth 2 (two) times daily. 180 tablet 1  . temazepam (RESTORIL) 30 MG capsule Take 1 capsule (30 mg total) by mouth at bedtime as needed for sleep. 30 capsule 0   No current facility-administered medications for this visit.     Musculoskeletal: Strength & Muscle Tone: UTA Gait & Station: normal Patient leans: N/A  Psychiatric Specialty Exam: Review of Systems  Respiratory: Positive for cough.   Psychiatric/Behavioral: Positive for dysphoric mood and sleep disturbance.  All other systems reviewed and are negative.   There were no vitals taken for this visit.There is no height or weight on file to calculate BMI.  General Appearance: Casual  Eye Contact:  Fair  Speech:  Clear and Coherent  Volume:  Normal  Mood:  Depressed  Affect:  Congruent  Thought Process:  Goal Directed and Descriptions  of Associations: Intact  Orientation:  Full (Time, Place, and Person)  Thought Content: Logical   Suicidal Thoughts:  No  Homicidal Thoughts:  No  Memory:  Immediate;   Fair Recent;   Fair Remote;   Fair  Judgement:  Fair  Insight:  Fair  Psychomotor Activity:  Normal  Concentration:  Concentration: Fair and Attention Span: Fair  Recall:  [employer] of Knowledge: Fair  Language: Fair  Akathisia:  No  Handed:  Right  AIMS (if indicated): denies tremors, rigidity  Assets:  Communication Skills Desire for Improvement Housing Social Support  ADL's:  Intact  Cognition: WNL  Sleep:  Improving   Screenings: Mini-Mental     Clinical Support from 10/08/2018 in [facility] [place] from 10/05/2017 in [facility]  Total Score (max 30 points )  27  30    [id number]     Office Visit from 03/03/2019 in [facility] Office Visit from 01/16/2019 in [facility], 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  Patient is biologically predisposed given his multiple medical problems, history of trauma.  Patient also has psychosocial stressors of several deaths in the family.  Patient will benefit from medication readjustment and psychotherapy sessions since he continues to struggle with mood symptoms.  Plan as noted below.  Plan MDD-some progress Reduce mirtazapine to 7.5 mg p.o. nightly Increase Lexapro to 15 mg p.o. daily  PTSD-unstable Continue CBT with his therapist Ms. Alden Hipp Lexapro as prescribed.  Insomnia-improving Temazepam 30 mg p.o. nightly Discussed to work on his sleep hygiene techniques try cutting back on caffeine as well as setting a good bedtime and a wake up time. Continue CPAP for OSA Also discussed adding melatonin as needed  Tobacco use disorder-unstable Patient is trying to cut back, provided smoking cessation counseling.  Follow-up in clinic in 3 to 4 weeks or sooner if needed.  January 19 at 1:20 PM  I have spent atleast 15 minutes non  face to face with patient today. More than 50 % of the time was spent for psychoeducation and supportive psychotherapy and care coordination. This note was generated in part or whole with voice recognition software. Voice recognition is usually quite accurate but there are transcription errors that can and very often do occur. I apologize for any typographical errors that were not detected and corrected.       Ursula Alert, MD 04/14/2019, 5:16 PM

## 2019-04-15 ENCOUNTER — Telehealth: Payer: Self-pay

## 2019-04-15 ENCOUNTER — Ambulatory Visit (INDEPENDENT_AMBULATORY_CARE_PROVIDER_SITE_OTHER): Payer: Medicare Other | Admitting: Adult Health

## 2019-04-15 VITALS — BP 140/90 | HR 84 | Temp 97.8°F | Resp 18 | Ht 69.0 in | Wt 239.0 lb

## 2019-04-15 DIAGNOSIS — E1165 Type 2 diabetes mellitus with hyperglycemia: Secondary | ICD-10-CM | POA: Diagnosis not present

## 2019-04-15 DIAGNOSIS — I10 Essential (primary) hypertension: Secondary | ICD-10-CM

## 2019-04-15 DIAGNOSIS — R0600 Dyspnea, unspecified: Secondary | ICD-10-CM

## 2019-04-15 DIAGNOSIS — R0609 Other forms of dyspnea: Secondary | ICD-10-CM

## 2019-04-15 MED ORDER — SITAGLIPTIN PHOS-METFORMIN HCL 50-500 MG PO TABS: 1.0000 | ORAL_TABLET | Freq: Two times a day (BID) | ORAL | 2 refills | Status: DC

## 2019-04-15 MED ORDER — FARXIGA 10 MG PO TABS: 10.0000 mg | ORAL_TABLET | Freq: Every day | ORAL | 1 refills | Status: DC

## 2019-04-15 MED ORDER — ATORVASTATIN CALCIUM 10 MG PO TABS: 10.0000 mg | ORAL_TABLET | Freq: Every day | ORAL | 1 refills | Status: DC

## 2019-04-15 NOTE — Telephone Encounter (Signed)
Pt was informed that we would like for him to get a [facility] x[facility] and that we also would like for him to get a pft done in office, then follow up with him in about 3 weeks.

## 2019-04-15 NOTE — Telephone Encounter (Signed)
Appt for pft and follow up have been made.

## 2019-04-15 NOTE — Telephone Encounter (Signed)
-----   Message from Kendell Bane, NP sent at 04/15/2019  1:32 PM EST ----- I told him I would have someone let him know after I saw dr Humphrey Rolls today.  Dr Humphrey Rolls feels like we need to evaluate his lungs, I have ordered a PFT  (in our office) and a chest x-ray (hospital) they will call him to set up PFT appt, and he needs to go to hospital to get CXR.  We will follow up with him after that, so make him an appt in 3 weeks...providing we can get his PFT done.

## 2019-04-15 NOTE — Progress Notes (Signed)
[facility] [place], [place] [address]  Internal MEDICINE  Office Visit Note  Patient Name: Raymond [name]  [id number]  [id number]  Date of Service: 04/15/2019  Chief Complaint  Patient presents with  . Hypertension  . Hyperlipidemia  . Diabetes    HPI  Pt is here for follow up on  DM, HLd, and HTN.  He continues to report sob with minimal effort, such as tying his shoes.  He had an Echo performed ;that shows Normal EF, with some diastolic dysfunction and trace MR.  He denies any feet swelling. His DM is poorly controlled, blood sugar this morning was 311 mg/dl, and his last A1C was 11.7 one month ago.  Pt was confused about medications.  He stopped his janumet.  He continues to take  Toujeo, Humalog at meals.  His blood pressure is borderline today in office 140/90. Pt reports an incident this week where he was sitting, and became SOB, with dizziness and light headedness  Current Medication: Outpatient Encounter Medications as of 04/15/2019  Medication Sig  . ACCU-CHEK FASTCLIX LANCETS MISC yse as directed twice a day  . albuterol (PROAIR HFA) 108 (90 Base) MCG/ACT inhaler Inhale 2-4 puffs by mouth Every 4-6 hours as needed for wheezing, cough, and/or shortness of breath  . aspirin EC 81 MG tablet Take 81 mg by mouth daily at 3 pm.   . atorvastatin (LIPITOR) 10 MG tablet Take 1 tablet (10 mg total) by mouth daily.  . dapagliflozin propanediol (FARXIGA) 10 MG TABS tablet Take 10 mg by mouth daily.  Marland Kitchen escitalopram (LEXAPRO) 10 MG tablet Take 1.5 tablets (15 mg total) by mouth daily with breakfast.  . famotidine (PEPCID) 20 MG tablet Take 1 tablet (20 mg total) by mouth 2 (two) times daily.  Marland Kitchen gabapentin (NEURONTIN) 100 MG capsule Take 1 capsule (100 mg total) by mouth 3 (three) times daily.  Marland Kitchen glucose blood (ACCU-CHEK AVIVA PLUS) test strip Blood sugar testing TID and as needed. E11.65  . Insulin Glargine, 2 Unit Dial, (TOUJEO MAX SOLOSTAR) 300 UNIT/ML  SOPN Inject 60 Units into the skin daily. (Patient taking differently: Inject 64 Units into the skin at bedtime. )  . insulin lispro (HUMALOG KWIKPEN) 100 UNIT/ML KwikPen Use as directed per  sliding scale max units 8-10 per meal (Patient taking differently: Inject 12-15 Units into the skin 3 (three) times daily between meals. Sliding Scale Insulin)  . metoprolol tartrate (LOPRESSOR) 100 MG tablet Take 1 tablet (100 mg total) by mouth 2 (two) times daily.  . mirtazapine (REMERON) 7.5 MG tablet Take 1 tablet (7.5 mg total) by mouth at bedtime.  . nitroGLYCERIN (NITROSTAT) 0.4 MG SL tablet Place 0.4 mg under the tongue every 5 (five) minutes x 3 doses as needed for chest pain.   . pantoprazole (PROTONIX) 40 MG tablet Take 1 tablet (40 mg total) by mouth daily.  . sitaGLIPtin-metformin (JANUMET) 50-500 MG tablet Take 1 tablet by mouth 2 (two) times daily.  . temazepam (RESTORIL) 30 MG capsule Take 1 capsule (30 mg total) by mouth at bedtime as needed for sleep.  . [DISCONTINUED] dapagliflozin propanediol (FARXIGA) 10 MG TABS tablet Take 10 mg by mouth daily.  . [DISCONTINUED] sitaGLIPtin-metformin (JANUMET) 50-500 MG tablet Take 1 tablet by mouth 2 (two) times daily.   No facility-administered encounter medications on file as of 04/15/2019.    Surgical History: Past Surgical History:  Procedure Laterality Date  .  ingrown toenail removal    . CHOLECYSTECTOMY  N/A 01/23/2019   Procedure: LAPAROSCOPIC CHOLECYSTECTOMY;  Surgeon: Olean Ree, MD;  Location: [facility] ORS;  Service: General;  Laterality: N/A;  . CLAVICLE SURGERY Right   . COLONOSCOPY  2017  . MOUTH SURGERY     REMOVED ALL TEETH  . SHOULDER SURGERY Right   . UPPER GI ENDOSCOPY  2017    Medical History: Past Medical History:  Diagnosis Date  . Anxiety   . Arthritis    NECK  . Bronchitis   . Depression   . Diabetes mellitus without complication ([place])   . 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 Dyspnea    DUE TO GALLBLADDER  PER PT  . Family history of adverse reaction to anesthesia    N/V, TROUBLE BREATHING COMING OUT OF ANESTHESIA  . Fatty liver   . GERD (gastroesophageal reflux disease)   . Headache    MIGRAINES  . Hyperlipidemia   . Hypertension   . Irregular heart beat unk  . Myocardial infarction ([place]) 2010   MILD   . PTSD (post-traumatic stress disorder)   . Sleep apnea    USES CPAP  . Tachycardia     Family History: Family History  Problem Relation Age of Onset  . Hypertension Mother   . Diabetes type II Mother   . Diabetes Mother   . COPD Father   . Cancer Father   . Heart disease Father   . Diabetes Sister   . Anxiety disorder Sister   . Depression Sister   . Diabetes Brother   . Anxiety disorder Brother   . Depression Brother   . Diabetes Maternal Aunt   . Diabetes Sister   . Anxiety disorder Sister   . Depression Sister   . Diabetes Sister   . Anxiety disorder Sister   . Depression Sister   . Diabetes Sister   . Anxiety disorder Sister   . Depression Sister   . Colon cancer Maternal Uncle     Social History   Socioeconomic History  . Marital status: Divorced    Spouse name: Not on file  . Number of children: 4  . Years of education: Not on file  . Highest education level: 8th grade  Occupational History  . Not on file  Tobacco Use  . Smoking status: Current Every Day Smoker    Packs/day: 2.00    Years: 40.00    Pack years: 80.00    Types: Cigarettes  . Smokeless tobacco: Former Systems developer    Types: Chew  . Tobacco comment: Reports has tried multiple things to help quit and cut back.   Substance and Sexual Activity  . Alcohol use: No    Alcohol/week: 0.0 standard drinks  . Drug use: No  . Sexual activity: Not Currently  Other Topics Concern  . Not on file  Social History Narrative  . Not on file   Social Determinants of Health   Financial Resource Strain: High Risk  . Difficulty of Paying Living Expenses: Very hard  Food Insecurity: Food Insecurity  Present  . Worried About Charity fundraiser in the Last Year: Often true  . Ran Out of Food in the Last Year: Often true  Transportation Needs: No Transportation Needs  . Lack of Transportation (Medical): No  . Lack of Transportation (Non-Medical): No  Physical Activity: Inactive  . Days of Exercise per Week: 0 days  . Minutes of Exercise per Session: 0 min  Stress: Stress Concern Present  . Feeling of Stress : Very  much  Social Connections: Unknown  . Frequency of Communication with Friends and Family: Not on file  . Frequency of Social Gatherings with Friends and Family: Not on file  . Attends Religious Services: Never  . Active Member of Clubs or Organizations: No  . Attends Archivist Meetings: Never  . Marital Status: Divorced  Human resources officer Violence: Not At Risk  . Fear of Current or Ex-Partner: No  . Emotionally Abused: No  . Physically Abused: No  . Sexually Abused: No      Review of Systems  Constitutional: Negative.  Negative for chills, fatigue and unexpected weight change.  HENT: Negative.  Negative for congestion, rhinorrhea, sneezing and sore throat.   Eyes: Negative for redness.  Respiratory: Negative.  Negative for cough, chest tightness and shortness of breath.   Cardiovascular: Negative.  Negative for chest pain and palpitations.  Gastrointestinal: Negative.  Negative for abdominal pain, constipation, diarrhea, nausea and vomiting.  Endocrine: Negative.   Genitourinary: Negative.  Negative for dysuria and frequency.  Musculoskeletal: Negative.  Negative for arthralgias, back pain, joint swelling and neck pain.  Skin: Negative.  Negative for rash.  Allergic/Immunologic: Negative.   Neurological: Negative.  Negative for tremors and numbness.  Hematological: Negative for adenopathy. Does not bruise/bleed easily.  Psychiatric/Behavioral: Negative.  Negative for behavioral problems, sleep disturbance and suicidal ideas. The patient is not  nervous/anxious.     Vital Signs: BP 140/90 (BP Location: Left Arm)   Pulse 84   Temp 97.8 F (36.6 C)   Resp 18   Ht 5\' 9"  (1.753 m)   Wt 239 lb (108.4 kg)   SpO2 95%   BMI 35.29 kg/m    Physical Exam Vitals and nursing note reviewed.  Constitutional:      General: He is not in acute distress.    Appearance: He is well-developed. He is not diaphoretic.  HENT:     Head: Normocephalic and atraumatic.     Mouth/Throat:     Pharynx: No oropharyngeal exudate.  Eyes:     Pupils: Pupils are equal, round, and reactive to light.  Neck:     Thyroid: No thyromegaly.     Vascular: No JVD.     Trachea: No tracheal deviation.  Cardiovascular:     Rate and Rhythm: Normal rate and regular rhythm.     Heart sounds: Normal heart sounds. No murmur. No friction rub. No gallop.   Pulmonary:     Effort: Pulmonary effort is normal. No respiratory distress.     Breath sounds: Normal breath sounds. No wheezing or rales.  Chest:     Chest wall: No tenderness.  Abdominal:     Palpations: Abdomen is soft.     Tenderness: There is no abdominal tenderness. There is no guarding.  Musculoskeletal:        General: Normal range of motion.     Cervical back: Normal range of motion and neck supple.  Lymphadenopathy:     Cervical: No cervical adenopathy.  Skin:    General: Skin is warm and dry.  Neurological:     Mental Status: He is alert and oriented to person, place, and time.     Cranial Nerves: No cranial nerve deficit.  Psychiatric:        Behavior: Behavior normal.        Thought Content: Thought content normal.        Judgment: Judgment normal.     Assessment/Plan: 1. Dyspnea on exertion Will get PFT and  CXR, and follow up when results are available.  - DG Chest 2 View; Future - Pulmonary function test; Future  2. Uncontrolled type 2 diabetes mellitus with hyperglycemia (HCC) Pt has not been taking Janumet, New RX provided and medication compliance is encouraged.  -  sitaGLIPtin-metformin (JANUMET) 50-500 MG tablet; Take 1 tablet by mouth 2 (two) times daily.  Dispense: 60 tablet; Refill: 2  3. Essential hypertension Stable, borderline, will follow up at future visits.   General Counseling: Kol verbalizes understanding of the findings of todays visit and agrees with plan of treatment. I have discussed any further diagnostic evaluation that may be needed or ordered today. We also reviewed his medications today. he has been encouraged to call the office with any questions or concerns that should arise related to todays visit.    Orders Placed This Encounter  Procedures  . DG Chest 2 View  . Pulmonary function test    Meds ordered this encounter  Medications  . atorvastatin (LIPITOR) 10 MG tablet    Sig: Take 1 tablet (10 mg total) by mouth daily.    Dispense:  30 tablet    Refill:  1  . sitaGLIPtin-metformin (JANUMET) 50-500 MG tablet    Sig: Take 1 tablet by mouth 2 (two) times daily.    Dispense:  60 tablet    Refill:  2  . dapagliflozin propanediol (FARXIGA) 10 MG TABS tablet    Sig: Take 10 mg by mouth daily.    Dispense:  30 tablet    Refill:  1    Time spent: 25 Minutes   This patient was seen by Orson Gear AGNP-C in Collaboration with Dr Lavera Guise as a part of collaborative care agreement     Kendell Bane AGNP-C Internal medicine

## 2019-04-16 ENCOUNTER — Ambulatory Visit: Payer: Medicare Other | Admitting: Adult Health

## 2019-04-18 NOTE — Progress Notes (Unsigned)
Meds needs adjustment

## 2019-04-28 ENCOUNTER — Encounter: Payer: Self-pay | Admitting: Licensed Clinical Social Worker

## 2019-04-28 ENCOUNTER — Ambulatory Visit (INDEPENDENT_AMBULATORY_CARE_PROVIDER_SITE_OTHER): Payer: Medicare Other | Admitting: Licensed Clinical Social Worker

## 2019-04-28 ENCOUNTER — Other Ambulatory Visit: Payer: Self-pay

## 2019-04-28 DIAGNOSIS — F331 Major depressive disorder, recurrent, moderate: Secondary | ICD-10-CM | POA: Diagnosis not present

## 2019-04-28 DIAGNOSIS — F4310 Post-traumatic stress disorder, unspecified: Secondary | ICD-10-CM

## 2019-04-28 NOTE — Progress Notes (Signed)
Virtual Visit via Telephone Note  I connected with Raymond [name] on 04/28/19 at  8:00 AM EST by telephone and verified that I am speaking with the correct person using two identifiers.   I discussed the limitations, risks, security and privacy concerns of performing an evaluation and management service by telephone and the availability of in person appointments. I also discussed with the patient that there may be a patient responsible charge related to this service. The patient expressed understanding and agreed to proceed.   I discussed the assessment and treatment plan with the patient. The patient was provided an opportunity to ask questions and all were answered. The patient agreed with the plan and demonstrated an understanding of the instructions.   The patient was advised to call back or seek an in-person evaluation if the symptoms worsen or if the condition fails to improve as anticipated.  I provided 37 minutes of non-face-to-face time during this encounter.   Raymond Hipp, LCSW    THERAPIST PROGRESS NOTE  Session Time: 0800  Participation Level: Active  Behavioral Response: NeatAlertAnxious  Type of Therapy: Individual Therapy  Treatment Goals addressed: Coping  Interventions: Supportive  Summary: Raymond [name] is a 54 y.o. male who presents with continued symptoms related to his diagnosis. Raymond [name] reports doing well since our last session. He reports his anxiety symptoms have been more manageable, and he believes the medication is working. Raymond [name] stated he still experiences anxiety at times, but is most often able to manage it in the moment. LCSW asked Raymond [name] to elaborate on how he's been able to manage his anxiety in the moment. Raymond [name] reports he has primarily been able to manage his anxiety either by utilizing deep breathing or by "closing my eyes and imagining I'm somewhere else." LCSW validated both efforts to manage anxiety symptoms, and encouraged Raymond [name]  to have a list of standby activities he can utilize if those two things do not work. LCSW assisted Raymond [name] in coming up with additional activities he can utilize, and asked Raymond [name] to continue to brainstorm "standby activities." LCSW asked if Raymond [name] had utilized the resources Raymond [name] to Raymond [name]. Raymond [name] reported he was not able to open them on his phone. LCSW stated she would mail these things to Raymond [name] this week so he is able to access them regardless of email access. [name] expressed understanding and agreement. Raymond [name] reported things have been going well otherwise, but noted a few new sleep problems that he and his doctors are addressing. LCSW reviewed sleep hygiene suggestions and held space for Raymond [name] to vent his frustrations around this topic.   Suicidal/Homicidal: No  Therapist Response: Raymond [name] continues to work towards his tx goals but has not yet reached them. We will continue to work on improving emotional regulation skills moving forward as well as improving CBT skills to manage anxiety.   Plan: Return again in 4 weeks.  Diagnosis: Axis I: Post Traumatic Stress Disorder    Axis II: No diagnosis    Raymond Hipp, LCSW 04/28/2019

## 2019-05-05 ENCOUNTER — Telehealth: Payer: Self-pay

## 2019-05-05 NOTE — Telephone Encounter (Signed)
Confirmed appointment with patient. klh °

## 2019-05-06 ENCOUNTER — Telehealth: Payer: Self-pay

## 2019-05-06 NOTE — Telephone Encounter (Signed)
He already tried and failed all those medications already - can we appeal ?

## 2019-05-06 NOTE — Telephone Encounter (Signed)
received a fax from pharmacy that temazepam 7.5mg  was not covered by pt insurance and that the pt could try ramelteon, belsomra, trazodone.

## 2019-05-06 NOTE — Telephone Encounter (Signed)
pt called states that his insurance changed and that the temazepam is now over a $100 he will need something cheapper.  pt was told that we had just received something from the pharmacy to try several diffierent choses of medications. And that a message was already sent to dr. Shea Evans.

## 2019-05-07 ENCOUNTER — Telehealth: Payer: Self-pay | Admitting: Psychiatry

## 2019-05-07 ENCOUNTER — Ambulatory Visit: Payer: Medicare Other | Admitting: Internal Medicine

## 2019-05-07 DIAGNOSIS — F5105 Insomnia due to other mental disorder: Secondary | ICD-10-CM

## 2019-05-07 MED ORDER — TEMAZEPAM 30 MG PO CAPS: 30.0000 mg | ORAL_CAPSULE | Freq: Every evening | ORAL | 3 refills | Status: DC | PRN

## 2019-05-07 NOTE — Telephone Encounter (Signed)
Sent Temazepam 30 mg to pharmacy.

## 2019-05-07 NOTE — Telephone Encounter (Signed)
received notice that temazepan 7.5mg  was approved through 04-23-2020

## 2019-05-07 NOTE — Telephone Encounter (Signed)
He is Temazepam 30 mg daily.

## 2019-05-12 ENCOUNTER — Other Ambulatory Visit: Payer: Self-pay

## 2019-05-12 ENCOUNTER — Telehealth: Payer: Self-pay

## 2019-05-12 ENCOUNTER — Ambulatory Visit
Admission: RE | Admit: 2019-05-12 | Discharge: 2019-05-12 | Disposition: A | Discharge status: Home / Self Care | Payer: Medicare Other | Source: Ambulatory Visit | Attending: Adult Health | Admitting: Adult Health

## 2019-05-12 DIAGNOSIS — R0600 Dyspnea, unspecified: Secondary | ICD-10-CM | POA: Insufficient documentation

## 2019-05-12 DIAGNOSIS — R05 Cough: Secondary | ICD-10-CM | POA: Diagnosis not present

## 2019-05-12 DIAGNOSIS — R0609 Other forms of dyspnea: Secondary | ICD-10-CM

## 2019-05-12 DIAGNOSIS — R0602 Shortness of breath: Secondary | ICD-10-CM | POA: Diagnosis not present

## 2019-05-12 DIAGNOSIS — R079 Chest pain, unspecified: Secondary | ICD-10-CM | POA: Diagnosis not present

## 2019-05-12 NOTE — Telephone Encounter (Signed)
Confirmed virtual visit with patient. klh 

## 2019-05-13 ENCOUNTER — Ambulatory Visit (INDEPENDENT_AMBULATORY_CARE_PROVIDER_SITE_OTHER): Payer: Medicare Other | Admitting: Psychiatry

## 2019-05-13 ENCOUNTER — Encounter: Payer: Self-pay | Admitting: Psychiatry

## 2019-05-13 DIAGNOSIS — F17200 Nicotine dependence, unspecified, uncomplicated: Secondary | ICD-10-CM | POA: Diagnosis not present

## 2019-05-13 DIAGNOSIS — F5105 Insomnia due to other mental disorder: Secondary | ICD-10-CM | POA: Diagnosis not present

## 2019-05-13 DIAGNOSIS — F4310 Post-traumatic stress disorder, unspecified: Secondary | ICD-10-CM

## 2019-05-13 DIAGNOSIS — F331 Major depressive disorder, recurrent, moderate: Secondary | ICD-10-CM

## 2019-05-13 MED ORDER — ESCITALOPRAM OXALATE 20 MG PO TABS: 20.0000 mg | ORAL_TABLET | Freq: Every day | ORAL | 1 refills | Status: DC

## 2019-05-13 NOTE — Progress Notes (Signed)
Virtual Visit via Video Note  I connected with Raymond [name] on 05/13/19 at  1:20 PM EST by a video enabled telemedicine application and verified that I am speaking with the correct person using two identifiers.   I discussed the limitations of evaluation and management by telemedicine and the availability of in person appointments. The patient expressed understanding and agreed to proceed.     I discussed the assessment and treatment plan with the patient. The patient was provided an opportunity to ask questions and all were answered. The patient agreed with the plan and demonstrated an understanding of the instructions.   The patient was advised to call back or seek an in-person evaluation if the symptoms worsen or if the condition fails to improve as anticipated.   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  A video call was initiated however due to connection problem it had to be changed to a phone call.  Patient today reports the temazepam has definitely helped him with his sleep however it continues to be restless often.  He reports he is currently struggling with diarrhea which is a side effect to one of his diabetic pills.His diabetes continues to remain uncontrolled per report.  Patient also reports he has neuropathy of his feet and burning sensation which also affects his sleep.  He reports he takes gabapentin and currently sees a provider for the same.  Patient reports mood wise he is making progress.  Patient denies any suicidality, homicidality or perceptual disturbances.  Patient denies any other concerns today. Visit Diagnosis:    ICD-10-CM    1. PTSD (post-traumatic stress disorder)  F43.10 escitalopram (LEXAPRO) 20 MG tablet  2. MDD (major depressive disorder), recurrent episode, moderate (HCC)  F33.1 escitalopram (LEXAPRO) 20 MG tablet  3. Insomnia due to mental disorder  F51.05   4. Tobacco use disorder  F17.200     Past Psychiatric History: I have reviewed past psychiatric history from my progress note on 05/23/2018.  Past trials of Prozac, Zoloft, Celexa, Tegretol, doxepin, Klonopin, Seroquel, mirtazapine, Belsomra, Lunesta, Ambien, Rozerem.  Past Medical History:  Past Medical History:  Diagnosis Date  . Anxiety   . Arthritis    NECK  . Bronchitis   . Depression   . Diabetes mellitus without complication ([place])   . 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 Dyspnea    DUE TO GALLBLADDER PER PT  . Family history of adverse reaction to anesthesia    N/V, TROUBLE BREATHING COMING OUT OF ANESTHESIA  . Fatty liver   . GERD (gastroesophageal reflux disease)   . Headache    MIGRAINES  . Hyperlipidemia   . Hypertension   . Irregular heart beat unk  . Myocardial infarction ([place]) 2010   MILD   . PTSD (post-traumatic stress disorder)   . Sleep apnea    USES CPAP  . Tachycardia     Past Surgical History:  Procedure Laterality Date  .  ingrown toenail removal    . CHOLECYSTECTOMY N/A 01/23/2019   Procedure: LAPAROSCOPIC CHOLECYSTECTOMY;  Surgeon: Olean Ree, MD;  Location: [facility] ORS;  Service: General;  Laterality: N/A;  . CLAVICLE SURGERY Right   . COLONOSCOPY  2017  . MOUTH SURGERY     REMOVED ALL TEETH  . SHOULDER SURGERY Right   . UPPER GI ENDOSCOPY  2017    Family Psychiatric History: I have reviewed family psychiatric history from my progress note on 05/23/2018.  Family History:  Family History  Problem Relation Age of Onset  . Hypertension Mother   . Diabetes type II Mother   . Diabetes Mother   . COPD Father   . Cancer Father   . Heart disease Father   . Diabetes Sister   . Anxiety disorder  Sister   . Depression Sister   . Diabetes Brother   . Anxiety disorder Brother   . Depression Brother   . Diabetes Maternal Aunt   . Diabetes Sister   . Anxiety disorder Sister   . Depression Sister   . Diabetes Sister   . Anxiety disorder Sister   . Depression Sister   . Diabetes Sister   . Anxiety disorder Sister   . Depression Sister   . Colon cancer Maternal Uncle     Social History: I have reviewed social history from my progress note on 05/23/2018. Social History   Socioeconomic History  . Marital status: Divorced    Spouse name: Not on file  . Number of children: 4  . Years of education: Not on file  . Highest education level: 8th grade  Occupational History  . Not on file  Tobacco Use  . Smoking status: Current Every Day Smoker    Packs/day: 2.00    Years: 40.00    Pack years: 80.00    Types: Cigarettes  . Smokeless tobacco: Former Systems developer    Types: Chew  . Tobacco comment: Reports has tried multiple things to help quit and cut back.   Substance and Sexual Activity  . Alcohol use: No    Alcohol/week: 0.0 standard drinks  . Drug use: No  . Sexual activity: Not Currently  Other Topics Concern  . Not on file  Social History Narrative  . Not on file   Social Determinants of Health   Financial Resource Strain: High Risk  . Difficulty of Paying Living Expenses: Very hard  Food Insecurity: Food Insecurity Present  . Worried About Charity fundraiser in the Last Year: Often true  . Ran Out of Food in the Last Year: Often true  Transportation Needs: No Transportation Needs  . Lack of Transportation (Medical): No  . Lack of Transportation (Non-Medical): No  Physical Activity: Inactive  . Days of Exercise per Week: 0 days  . Minutes of Exercise per Session: 0 min  Stress: Stress Concern Present  . Feeling of Stress : Very much  Social Connections: Unknown  . Frequency of Communication with Friends and Family: Not on file  . Frequency of Social Gatherings with  Friends and Family: Not on file  . Attends Religious Services: Never  . Active Member of Clubs or Organizations: No  . Attends Archivist Meetings: Never  . Marital Status: Divorced    Allergies:  Allergies  Allergen Reactions  . Onion Anaphylaxis  . Hydrocodone   . Norco [Hydrocodone-Acetaminophen] Itching  . Hydrocodone-Acetaminophen Itching  . Nickel Rash    Metabolic Disorder Labs: Lab Results  Component Value Date   HGBA1C 11.7 (A) 03/03/2019   MPG 294.83 05/31/2017   No results found for: PROLACTIN Lab Results  Component Value Date   CHOL 134 07/15/2015   TRIG 227 (H) 07/15/2015   HDL 28 (L)  07/15/2015   CHOLHDL 4.8 07/15/2015   VLDL 26 12/09/2012   LDLCALC 61 07/15/2015   LDLCALC 96 12/09/2012   Lab Results  Component Value Date   TSH 4.120 06/10/2018   TSH 2.000 07/15/2015    Therapeutic Level Labs: No results found for: LITHIUM No results found for: VALPROATE No components found for:  CBMZ  Current Medications: Current Outpatient Medications  Medication Sig Dispense Refill  . ACCU-CHEK FASTCLIX LANCETS MISC yse as directed twice a day 100 each 1  . albuterol (PROAIR HFA) 108 (90 Base) MCG/ACT inhaler Inhale 2-4 puffs by mouth Every 4-6 hours as needed for wheezing, cough, and/or shortness of breath 3 Inhaler 1  . aspirin EC 81 MG tablet Take 81 mg by mouth daily at 3 pm.     . atorvastatin (LIPITOR) 10 MG tablet Take 1 tablet (10 mg total) by mouth daily. 30 tablet 1  . dapagliflozin propanediol (FARXIGA) 10 MG TABS tablet Take 10 mg by mouth daily. 30 tablet 1  . escitalopram (LEXAPRO) 20 MG tablet Take 1 tablet (20 mg total) by mouth daily. 30 tablet 1  . famotidine (PEPCID) 20 MG tablet Take 1 tablet (20 mg total) by mouth 2 (two) times daily. 60 tablet 3  . gabapentin (NEURONTIN) 100 MG capsule Take 1 capsule (100 mg total) by mouth 3 (three) times daily. 90 capsule 3  . glucose blood (ACCU-CHEK AVIVA PLUS) test strip Blood sugar testing  TID and as needed. E11.65 100 each 12  . Insulin Glargine, 2 Unit Dial, (TOUJEO MAX SOLOSTAR) 300 UNIT/ML SOPN Inject 60 Units into the skin daily. (Patient taking differently: Inject 64 Units into the skin at bedtime. ) 3 pen 5  . insulin lispro (HUMALOG KWIKPEN) 100 UNIT/ML KwikPen Use as directed per  sliding scale max units 8-10 per meal (Patient taking differently: Inject 12-15 Units into the skin 3 (three) times daily between meals. Sliding Scale Insulin) 15 mL 2  . metoprolol tartrate (LOPRESSOR) 100 MG tablet Take 1 tablet (100 mg total) by mouth 2 (two) times daily. 180 tablet 0  . nitroGLYCERIN (NITROSTAT) 0.4 MG SL tablet Place 0.4 mg under the tongue every 5 (five) minutes x 3 doses as needed for chest pain.     . pantoprazole (PROTONIX) 40 MG tablet Take 1 tablet (40 mg total) by mouth daily. 30 tablet 5  . sitaGLIPtin-metformin (JANUMET) 50-500 MG tablet Take 1 tablet by mouth 2 (two) times daily. 60 tablet 2  . temazepam (RESTORIL) 30 MG capsule Take 1 capsule (30 mg total) by mouth at bedtime as needed for sleep. 30 capsule 3   No current facility-administered medications for this visit.     Musculoskeletal: Strength & Muscle Tone: UTA Gait & Station: Reports as WNL Patient leans: N/A  Psychiatric Specialty Exam: Review of Systems  Gastrointestinal: Positive for diarrhea.  Psychiatric/Behavioral: Positive for sleep disturbance.  All other systems reviewed and are negative.   There were no vitals taken for this visit.There is no height or weight on file to calculate BMI.  General Appearance: UTA  Eye Contact:  UTA  Speech:  Clear and Coherent  Volume:  Normal  Mood:  Anxious  Affect:  UTA  Thought Process:  Goal Directed and Descriptions of Associations: Intact  Orientation:  Full (Time, Place, and Person)  Thought Content: Logical   Suicidal Thoughts:  No  Homicidal Thoughts:  No  Memory:  Immediate;   Fair Recent;   Fair Remote;   Fair  Judgement:  Fair   Insight:  Fair  Psychomotor Activity:  UTA  Concentration:  Concentration: Fair and Attention Span: Fair  Recall:  [employer] of Knowledge: Fair  Language: Fair  Akathisia:  No  Handed:  Right  AIMS (if indicated): denies tremors, rigidity  Assets:  Communication Skills Desire for Improvement Housing Social Support  ADL's:  Intact  Cognition: WNL  Sleep:  Improving   Screenings: Mini-Mental     Clinical Support from 10/08/2018 in [facility], [place] from 10/05/2017 in [facility]  Total Score (max 30 points )  27  30    PHQ2-9     Office Visit from 03/03/2019 in [facility] Office Visit from 01/16/2019 in [facility], [facility] Office Visit from 11/15/2018 in [facility], [place] from 10/08/2018 in [facility], [place] from 07/04/2018 in [facility], [facility]  PHQ-2 Total Score  0  0  0  0  0       Assessment and Plan: Raymond [name] is a 54 year old Caucasian male, disabled, lives in [place], divorced, has a history of PTSD, MDD, insomnia, diabetes mellitus, 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  Patient is biologically predisposed given his multiple medical problems, history of trauma.  Patient also has psychosocial stressors of several deaths in the family.  Patient is currently struggling with uncontrolled blood sugar levels, sleep issues due to neuropathy pain and diarrhea.  Patient will benefit from medication readjustment.  Plan as noted below.  Plan MDD-some progress We will discontinue mirtazapine since he continues to struggle with uncontrolled diabetes. Increase Lexapro to 20 mg p.o. daily  PTSD-improving Continue CBT with Ms. Alden Hipp. Lexapro as prescribed  Insomnia-improving Temazepam 30 mg p.o. nightly Continue CPAP for OSA. Discussed sleep hygiene techniques. Discussed  increasing melatonin as needed. Patient also advised to reach out to his provider who is managing his neuropathy pain since that is also having an impact on his sleep.  Tobacco use disorder-improving Patient is trying to cut back, provided smoking cessation counseling.  Follow-up in clinic in 6 weeks or sooner if needed.  March 10 at 2:20 PM  I have spent atleast 20 minutes non face to face with patient today. More than 50 % of the time was spent for  ordering medications and test ,psychoeducation and supportive psychotherapy and care coordination,as well as documenting clinical information in electronic health record. This note was generated in part or whole with voice recognition software. Voice recognition is usually quite accurate but there are transcription errors that can and very often do occur. I apologize for any typographical errors that were not detected and corrected.       Ursula Alert, MD 05/13/2019, 5:14 PM

## 2019-05-14 ENCOUNTER — Ambulatory Visit (INDEPENDENT_AMBULATORY_CARE_PROVIDER_SITE_OTHER): Payer: Medicare Other | Admitting: Internal Medicine

## 2019-05-14 ENCOUNTER — Other Ambulatory Visit: Payer: Self-pay | Admitting: Adult Health

## 2019-05-14 ENCOUNTER — Encounter: Payer: Self-pay | Admitting: Internal Medicine

## 2019-05-14 VITALS — Temp 98.1°F | Ht 69.0 in | Wt 239.0 lb

## 2019-05-14 DIAGNOSIS — M5440 Lumbago with sciatica, unspecified side: Secondary | ICD-10-CM

## 2019-05-14 DIAGNOSIS — Z9989 Dependence on other enabling machines and devices: Secondary | ICD-10-CM

## 2019-05-14 DIAGNOSIS — R0600 Dyspnea, unspecified: Secondary | ICD-10-CM

## 2019-05-14 DIAGNOSIS — F17210 Nicotine dependence, cigarettes, uncomplicated: Secondary | ICD-10-CM

## 2019-05-14 DIAGNOSIS — I10 Essential (primary) hypertension: Secondary | ICD-10-CM

## 2019-05-14 DIAGNOSIS — G4733 Obstructive sleep apnea (adult) (pediatric): Secondary | ICD-10-CM | POA: Diagnosis not present

## 2019-05-14 DIAGNOSIS — R0609 Other forms of dyspnea: Secondary | ICD-10-CM

## 2019-05-14 MED ORDER — PREDNISONE 10 MG PO TABS: ORAL_TABLET | ORAL | 0 refills | Status: DC

## 2019-05-14 MED ORDER — DULERA 100-5 MCG/ACT IN AERO: 2.0000 | INHALATION_SPRAY | Freq: Two times a day (BID) | RESPIRATORY_TRACT | 2 refills | Status: DC

## 2019-05-14 NOTE — Progress Notes (Signed)
Sent prednisone dose pack, for low back pain with sciatica, that has been getting worse since his gall bladder surgery.

## 2019-05-14 NOTE — Telephone Encounter (Signed)
received fax that the prior auth for the temazepam 30mg  capsule were previously aprroved on [id number] from  05-07-19 to 04-23-2020

## 2019-05-14 NOTE — Progress Notes (Signed)
[facility] [place], [place] [address]  Internal MEDICINE  Telephone Visit  Patient Name: Raymond [name]  [id number]  [id number]  Date of Service: 05/14/2019  I connected with the patient at 1139 by telephone and verified the patients identity using two identifiers.   I discussed the limitations, risks, security and privacy concerns of performing an evaluation and management service by telephone and the availability of in person appointments. I also discussed with the patient that there may be a patient responsible charge related to the service.  The patient expressed understanding and agrees to proceed.    Chief Complaint  Patient presents with  . Telephone Assessment  . Telephone Screen  . Follow-up    chest xray results     HPI  PT is seen via video. He is seen for follow up on CXR due to ongoing DOE.  His CXR was normal, and he has PFT scheduled.  He currently uses albuterol inhaler, and reports using it at least once a day.  Usually at night before bed.        Current Medication: Outpatient Encounter Medications as of 05/14/2019  Medication Sig  . ACCU-CHEK FASTCLIX LANCETS MISC yse as directed twice a day  . albuterol (PROAIR HFA) 108 (90 Base) MCG/ACT inhaler Inhale 2-4 puffs by mouth Every 4-6 hours as needed for wheezing, cough, and/or shortness of breath  . aspirin EC 81 MG tablet Take 81 mg by mouth daily at 3 pm.   . atorvastatin (LIPITOR) 10 MG tablet Take 1 tablet (10 mg total) by mouth daily.  . dapagliflozin propanediol (FARXIGA) 10 MG TABS tablet Take 10 mg by mouth daily.  Marland Kitchen escitalopram (LEXAPRO) 20 MG tablet Take 1 tablet (20 mg total) by mouth daily.  . famotidine (PEPCID) 20 MG tablet Take 1 tablet (20 mg total) by mouth 2 (two) times daily.  Marland Kitchen gabapentin (NEURONTIN) 100 MG capsule Take 1 capsule (100 mg total) by mouth 3 (three) times daily.  Marland Kitchen glucose blood (ACCU-CHEK AVIVA PLUS) test strip Blood sugar testing TID and as needed.  E11.65  . Insulin Glargine, 2 Unit Dial, (TOUJEO MAX SOLOSTAR) 300 UNIT/ML SOPN Inject 60 Units into the skin daily. (Patient taking differently: Inject 64 Units into the skin at bedtime. )  . insulin lispro (HUMALOG KWIKPEN) 100 UNIT/ML KwikPen Use as directed per  sliding scale max units 8-10 per meal (Patient taking differently: Inject 12-15 Units into the skin 3 (three) times daily between meals. Sliding Scale Insulin)  . metoprolol tartrate (LOPRESSOR) 100 MG tablet Take 1 tablet (100 mg total) by mouth 2 (two) times daily.  . nitroGLYCERIN (NITROSTAT) 0.4 MG SL tablet Place 0.4 mg under the tongue every 5 (five) minutes x 3 doses as needed for chest pain.   . pantoprazole (PROTONIX) 40 MG tablet Take 1 tablet (40 mg total) by mouth daily.  . sitaGLIPtin-metformin (JANUMET) 50-500 MG tablet Take 1 tablet by mouth 2 (two) times daily.  . temazepam (RESTORIL) 30 MG capsule Take 1 capsule (30 mg total) by mouth at bedtime as needed for sleep.  . mometasone-formoterol (DULERA) 100-5 MCG/ACT AERO Inhale 2 puffs into the lungs 2 (two) times daily.   No facility-administered encounter medications on file as of 05/14/2019.    Surgical History: Past Surgical History:  Procedure Laterality Date  .  ingrown toenail removal    . CHOLECYSTECTOMY N/A 01/23/2019   Procedure: LAPAROSCOPIC CHOLECYSTECTOMY;  Surgeon: Olean Ree, MD;  Location: [facility] ORS;  Service:  General;  Laterality: N/A;  . CLAVICLE SURGERY Right   . COLONOSCOPY  2017  . MOUTH SURGERY     REMOVED ALL TEETH  . SHOULDER SURGERY Right   . UPPER GI ENDOSCOPY  2017    Medical History: Past Medical History:  Diagnosis Date  . Anxiety   . Arthritis    NECK  . Bronchitis   . Depression   . Diabetes mellitus without complication ([place])   . 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 Dyspnea    DUE TO GALLBLADDER PER PT  . Family history of adverse reaction to anesthesia    N/V, TROUBLE BREATHING COMING OUT OF ANESTHESIA  . Fatty  liver   . GERD (gastroesophageal reflux disease)   . Headache    MIGRAINES  . Hyperlipidemia   . Hypertension   . Irregular heart beat unk  . Myocardial infarction ([place]) 2010   MILD   . PTSD (post-traumatic stress disorder)   . Sleep apnea    USES CPAP  . Tachycardia     Family History: Family History  Problem Relation Age of Onset  . Hypertension Mother   . Diabetes type II Mother   . Diabetes Mother   . COPD Father   . Cancer Father   . Heart disease Father   . Diabetes Sister   . Anxiety disorder Sister   . Depression Sister   . Diabetes Brother   . Anxiety disorder Brother   . Depression Brother   . Diabetes Maternal Aunt   . Diabetes Sister   . Anxiety disorder Sister   . Depression Sister   . Diabetes Sister   . Anxiety disorder Sister   . Depression Sister   . Diabetes Sister   . Anxiety disorder Sister   . Depression Sister   . Colon cancer Maternal Uncle     Social History   Socioeconomic History  . Marital status: Divorced    Spouse name: Not on file  . Number of children: 4  . Years of education: Not on file  . Highest education level: 8th grade  Occupational History  . Not on file  Tobacco Use  . Smoking status: Current Every Day Smoker    Packs/day: 2.00    Years: 40.00    Pack years: 80.00    Types: Cigarettes  . Smokeless tobacco: Former Systems developer    Types: Chew  . Tobacco comment: Reports has tried multiple things to help quit and cut back.   Substance and Sexual Activity  . Alcohol use: No    Alcohol/week: 0.0 standard drinks  . Drug use: No  . Sexual activity: Not Currently  Other Topics Concern  . Not on file  Social History Narrative  . Not on file   Social Determinants of Health   Financial Resource Strain: High Risk  . Difficulty of Paying Living Expenses: Very hard  Food Insecurity: Food Insecurity Present  . Worried About Charity fundraiser in the Last Year: Often true  . Ran Out of Food in the Last Year: Often true   Transportation Needs: No Transportation Needs  . Lack of Transportation (Medical): No  . Lack of Transportation (Non-Medical): No  Physical Activity: Inactive  . Days of Exercise per Week: 0 days  . Minutes of Exercise per Session: 0 min  Stress: Stress Concern Present  . Feeling of Stress : Very much  Social Connections: Unknown  . Frequency of Communication with Friends and Family: Not on file  .  Frequency of Social Gatherings with Friends and Family: Not on file  . Attends Religious Services: Never  . Active Member of Clubs or Organizations: No  . Attends Archivist Meetings: Never  . Marital Status: Divorced  Human resources officer Violence: Not At Risk  . Fear of Current or Ex-Partner: No  . Emotionally Abused: No  . Physically Abused: No  . Sexually Abused: No      Review of Systems  Constitutional: Negative.  Negative for chills, fatigue and unexpected weight change.  HENT: Negative.  Negative for congestion, rhinorrhea, sneezing and sore throat.   Eyes: Negative for redness.  Respiratory: Positive for shortness of breath. Negative for cough and chest tightness.        DOE  Cardiovascular: Negative.  Negative for chest pain and palpitations.  Gastrointestinal: Negative.  Negative for abdominal pain, constipation, diarrhea, nausea and vomiting.  Endocrine: Negative.   Genitourinary: Negative.  Negative for dysuria and frequency.  Musculoskeletal: Negative.  Negative for arthralgias, back pain, joint swelling and neck pain.  Skin: Negative.  Negative for rash.  Allergic/Immunologic: Negative.   Neurological: Negative.  Negative for tremors and numbness.  Hematological: Negative for adenopathy. Does not bruise/bleed easily.  Psychiatric/Behavioral: Negative.  Negative for behavioral problems, sleep disturbance and suicidal ideas. The patient is not nervous/anxious.     Vital Signs: Temp 98.1 F (36.7 C)   Ht 5\' 9"  (1.753 m)   Wt 239 lb (108.4 kg)   BMI 35.29  kg/m    Observation/Objective:  Well appearing, NAD noted.   Assessment/Plan: 1. OSA on CPAP Continue to use cpap as directed. Discussed compliance.   2. Essential hypertension Stable, continue present management.  3. Dyspnea on exertion - mometasone-formoterol (DULERA) 100-5 MCG/ACT AERO; Inhale 2 puffs into the lungs 2 (two) times daily.  Dispense: 13 g; Refill: 2  4. Cigarette nicotine dependence without complication Smoking cessation counseling: 1. Pt acknowledges the risks of long term smoking, she will try to quite smoking. 2. Options for different medications including nicotine products, chewing gum, patch etc, Wellbutrin and Chantix is discussed 3. Goal and date of compete cessation is discussed 4. Total time spent in smoking cessation is 15 min.  5. Morbid obesity ([place]) Obesity Counseling: Risk Assessment: An assessment of behavioral risk factors was made today and includes lack of exercise sedentary lifestyle, lack of portion control and poor dietary habits.  Risk Modification Advice: She was counseled on portion control guidelines. Restricting daily caloric intake to 1800. The detrimental long term effects of obesity on her health and ongoing poor compliance was also discussed with the patient.    General Counseling: [place] verbalizes understanding of the findings of today's phone visit and agrees with plan of treatment. I have discussed any further diagnostic evaluation that may be needed or ordered today. We also reviewed his medications today. he has been encouraged to call the office with any questions or concerns that should arise related to todays visit.    No orders of the defined types were placed in this encounter.   Meds ordered this encounter  Medications  . mometasone-formoterol (DULERA) 100-5 MCG/ACT AERO    Sig: Inhale 2 puffs into the lungs 2 (two) times daily.    Dispense:  13 g    Refill:  2    Time spent: 15 Minutes    Orson Gear  AGNP-C Pulmonary medicine

## 2019-05-21 ENCOUNTER — Other Ambulatory Visit: Payer: Self-pay

## 2019-05-21 ENCOUNTER — Other Ambulatory Visit: Payer: Self-pay | Admitting: Adult Health

## 2019-05-21 MED ORDER — ATORVASTATIN CALCIUM 10 MG PO TABS: 10.0000 mg | ORAL_TABLET | Freq: Every day | ORAL | 1 refills | Status: DC

## 2019-05-29 ENCOUNTER — Other Ambulatory Visit: Payer: Self-pay

## 2019-05-29 ENCOUNTER — Encounter: Payer: Self-pay | Admitting: Licensed Clinical Social Worker

## 2019-05-29 ENCOUNTER — Ambulatory Visit (INDEPENDENT_AMBULATORY_CARE_PROVIDER_SITE_OTHER): Payer: Medicare Other | Admitting: Licensed Clinical Social Worker

## 2019-05-29 DIAGNOSIS — F4310 Post-traumatic stress disorder, unspecified: Secondary | ICD-10-CM | POA: Diagnosis not present

## 2019-05-29 NOTE — Progress Notes (Signed)
cVirtual Visit via Telephone Note  I connected with Raymond [name] on 05/29/19 at  9:00 AM EST by telephone and verified that I am speaking with the correct person using two identifiers.   I discussed the limitations, risks, security and privacy concerns of performing an evaluation and management service by telephone and the availability of in person appointments. I also discussed with the patient that there may be a patient responsible charge related to this service. The patient expressed understanding and agreed to proceed    I discussed the assessment and treatment plan with the patient. The patient was provided an opportunity to ask questions and all were answered. The patient agreed with the plan and demonstrated an understanding of the instructions.   The patient was advised to call back or seek an in-person evaluation if the symptoms worsen or if the condition fails to improve as anticipated.  I provided 30 minutes of non-face-to-face time during this encounter.   Alden Hipp, Raymond [name]    THERAPIST PROGRESS NOTE  Session Time: 0900  Participation Level: Active  Behavioral Response: CasualAlertDepressed  Type of Therapy: Individual Therapy  Treatment Goals addressed: Coping  Interventions: Supportive  Summary: Raymond [name] is a 54 y.o. male who presents with distress tolerance moving forward. Raymond [name] reports he has been doing, "okay," since our last session. Raymond [name] reports he has been feeling unwell recently due to his neuropathy. He reports in colder weather, his symptoms increase significantly. He reports he has also been feeling more tired recently. He reports he has discussed these symptoms with his doctors, and they are working on addressing each thing he mentioned. Raymond [name] validated Raymond [name]'s feelings around his health, and held space for him to discuss his frustrations and thoughts around the situation. Raymond [name] reported the thing that bothers him the most is feeling  "useless," due to his medical problems. Raymond [name] asked Raymond [name] to elaborate on this if he felt comfortable. Raymond [name] reported feeling he is not useful to his family members anymore, "I've only ever been useful to them when I was working on their car or giving them money--but I can't do any of that anymore. I try, but my fingers and toes just start hurting." Raymond [name] validated Raymond [name]'s feelings and encouraged him to brainstorm other ways he could be useful or feel useful to his family. Raymond [name] expressed understanding and agreement. Raymond [name] reported the other thing on his mind is his relationship with his sons that he's not talked to in 6 years. We discussed ways he could start mending the relationship; however Raymond [name] was hesitant. We agreed we would revisit that subject at our next session.    Suicidal/Homicidal: No  Therapist Response: Raymond [name] continues to work towards his tx goals but has not yet reached them. We will continue to work on improving CBT skills and distress tolerance moving forward.    Plan: Return again in 4 weeks.  Diagnosis: Axis I: Post Traumatic Stress Disorder    Axis II: No diagnosis    Alden Hipp, Raymond [name] 05/29/2019

## 2019-06-01 ENCOUNTER — Other Ambulatory Visit: Payer: Self-pay | Admitting: Podiatry

## 2019-06-02 ENCOUNTER — Other Ambulatory Visit: Payer: Self-pay

## 2019-06-02 DIAGNOSIS — K219 Gastro-esophageal reflux disease without esophagitis: Secondary | ICD-10-CM

## 2019-06-02 MED ORDER — FAMOTIDINE 20 MG PO TABS: 20.0000 mg | ORAL_TABLET | Freq: Two times a day (BID) | ORAL | 3 refills | Status: DC

## 2019-06-26 ENCOUNTER — Encounter: Payer: Self-pay | Admitting: Licensed Clinical Social Worker

## 2019-06-26 ENCOUNTER — Other Ambulatory Visit: Payer: Self-pay

## 2019-06-26 ENCOUNTER — Ambulatory Visit (INDEPENDENT_AMBULATORY_CARE_PROVIDER_SITE_OTHER): Payer: Medicare Other | Admitting: Licensed Clinical Social Worker

## 2019-06-26 DIAGNOSIS — F4310 Post-traumatic stress disorder, unspecified: Secondary | ICD-10-CM

## 2019-06-26 NOTE — Progress Notes (Signed)
Virtual Visit via Video Note  I connected with Raymond [name] on 06/26/19 at  9:00 AM EST by a video enabled telemedicine application and verified that I am speaking with the correct person using two identifiers.   I discussed the limitations of evaluation and management by telemedicine and the availability of in person appointments. The patient expressed understanding and agreed to proceed.  I discussed the assessment and treatment plan with the patient. The patient was provided an opportunity to ask questions and all were answered. The patient agreed with the plan and demonstrated an understanding of the instructions.   The patient was advised to call back or seek an in-person evaluation if the symptoms worsen or if the condition fails to improve as anticipated.  I provided 35 minutes of non-face-to-face time during this encounter.   Alden Hipp, LCSW   THERAPIST PROGRESS NOTE  Session Time: 0900  Participation Level: Active  Behavioral Response: CasualAlertAnxious  Type of Therapy: Individual Therapy  Treatment Goals addressed: Coping  Interventions: Supportive  Summary: Raymond [name] is a 54 y.o. male who presents with continued symptoms related to his diagnosis. Raymond [name] reports doing well since our last session, but noted he has continued to feel very bored during the day. He states to attempt to do something to make himself less bored, he has purchased a new video game and also got his brother a copy of the game to play on his computer. LCSW validated Raymond [name]'s attempts to curb his boredom in order to make a change. Raymond [name] reports he wishes he and his brother could play together, but because they are not on the same system, they have not figured out a way to do this yet. LCSW validated these feelings and encourage Raymond [name] to reach out to someone that might be able to help him connect the two devices. Raymond [name] reported he felt his oldest son might be able to help, which led  Raymond [name] to his next problem he wished to discuss--feeling like no one checks in on him anymore. LCSW validated Raymond [name]'s feelings, and encouraged Raymond [name] to reach out to others in order to feel he has done everything he can to facilitate mending and improving relationships. Raymond [name] expressed doubt this would be successful, but noted he is willing to try anything to make change. LCSW validated Raymond [name]'s feelings and emphasized how amazing it is that he is so open to change.   Suicidal/Homicidal: No  Therapist Response: Raymond [name] continues to work towards his tx goals but has not yet reached them. We will continue to work on improving emotional regulation and distress tolerance skills moving forward.   Plan: Return again in 4 weeks.  Diagnosis: Axis I: Post Traumatic Stress Disorder    Axis II: No diagnosis    Alden Hipp, LCSW 06/26/2019

## 2019-06-30 ENCOUNTER — Telehealth: Payer: Self-pay

## 2019-06-30 NOTE — Telephone Encounter (Signed)
Confirmed appointment on 07/02/2019 and screened for covid. klh

## 2019-07-02 ENCOUNTER — Other Ambulatory Visit: Payer: Self-pay

## 2019-07-02 ENCOUNTER — Ambulatory Visit (INDEPENDENT_AMBULATORY_CARE_PROVIDER_SITE_OTHER): Payer: Medicare Other | Admitting: Psychiatry

## 2019-07-02 ENCOUNTER — Encounter: Payer: Self-pay | Admitting: Psychiatry

## 2019-07-02 ENCOUNTER — Ambulatory Visit: Payer: Medicare Other | Admitting: Internal Medicine

## 2019-07-02 DIAGNOSIS — F4310 Post-traumatic stress disorder, unspecified: Secondary | ICD-10-CM | POA: Diagnosis not present

## 2019-07-02 DIAGNOSIS — F5105 Insomnia due to other mental disorder: Secondary | ICD-10-CM | POA: Diagnosis not present

## 2019-07-02 DIAGNOSIS — F3341 Major depressive disorder, recurrent, in partial remission: Secondary | ICD-10-CM

## 2019-07-02 DIAGNOSIS — F17200 Nicotine dependence, unspecified, uncomplicated: Secondary | ICD-10-CM

## 2019-07-02 MED ORDER — ESCITALOPRAM OXALATE 20 MG PO TABS: 20.0000 mg | ORAL_TABLET | Freq: Every day | ORAL | 2 refills | Status: DC

## 2019-07-02 NOTE — Progress Notes (Signed)
Provider Location : [facility] Patient Location : Home  Virtual Visit via Video Note  I connected with Raymond [name] on 07/02/19 at  2:20 PM EST by a video enabled telemedicine application and verified that I am speaking with the correct person using two identifiers.   I discussed the limitations of evaluation and management by telemedicine and the availability of in person appointments. The patient expressed understanding and agreed to proceed.     I discussed the assessment and treatment plan with the patient. The patient was provided an opportunity to ask questions and all were answered. The patient agreed with the plan and demonstrated an understanding of the instructions.   The patient was advised to call back or seek an in-person evaluation if the symptoms worsen or if the condition fails to improve as anticipated.                                                                            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  Patient today reports he is currently making progress with regards to his mood symptoms.  He is compliant on medications as prescribed.  He continues to struggle with burning sensation due to his neuropathy.  He however reports it does not affect him much at night.  He is able to sleep better.  Patient continues to smoke cigarettes and reports he is not willing to quit yet.  Patient denies any suicidality, homicidality or perceptual disturbances.  Patient denies any other concerns today. Visit Diagnosis:    ICD-10-CM   1. PTSD (post-traumatic stress disorder)  F43.10 escitalopram (LEXAPRO) 20 MG tablet  2. MDD (major  depressive disorder), recurrent, in partial remission (HCC)  F33.41 escitalopram (LEXAPRO) 20 MG tablet  3. Insomnia due to mental disorder  F51.05   4. Tobacco use disorder  F17.200     Past Psychiatric History: I have reviewed past psychiatric history from my progress note on 05/23/2018.  Past trials of Prozac, Zoloft, Celexa, Tegretol, doxepin, Klonopin, Seroquel, mirtazapine, Belsomra, Lunesta, Ambien, Rozerem  Past Medical History:  Past Medical History:  Diagnosis Date  . Anxiety   . Arthritis    NECK  . Bronchitis   . Depression   . Diabetes mellitus without complication ([place])   . 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 Dyspnea    DUE TO GALLBLADDER PER PT  . Family history of adverse reaction to anesthesia    N/V, TROUBLE BREATHING COMING OUT OF ANESTHESIA  . Fatty liver   . GERD (gastroesophageal reflux disease)   . Headache    MIGRAINES  . Hyperlipidemia   . Hypertension   . Irregular heart beat unk  . Myocardial infarction ([place]) 2010   MILD   . PTSD (post-traumatic stress disorder)   . Sleep apnea    USES CPAP  . Tachycardia     Past Surgical History:  Procedure  Laterality Date  .  ingrown toenail removal    . CHOLECYSTECTOMY N/A 01/23/2019   Procedure: LAPAROSCOPIC CHOLECYSTECTOMY;  Surgeon: Olean Ree, MD;  Location: [facility] ORS;  Service: General;  Laterality: N/A;  . CLAVICLE SURGERY Right   . COLONOSCOPY  2017  . MOUTH SURGERY     REMOVED ALL TEETH  . SHOULDER SURGERY Right   . UPPER GI ENDOSCOPY  2017    Family Psychiatric History: I have reviewed family psychiatric history from my progress note on 05/23/2018.  Family History:  Family History  Problem Relation Age of Onset  . Hypertension Mother   . Diabetes type II Mother   . Diabetes Mother   . COPD Father   . Cancer Father   . Heart disease Father   . Diabetes Sister   . Anxiety disorder Sister   . Depression Sister   . Diabetes Brother   . Anxiety disorder Brother   . Depression  Brother   . Diabetes Maternal Aunt   . Diabetes Sister   . Anxiety disorder Sister   . Depression Sister   . Diabetes Sister   . Anxiety disorder Sister   . Depression Sister   . Diabetes Sister   . Anxiety disorder Sister   . Depression Sister   . Colon cancer Maternal Uncle     Social History: I have reviewed social history from my progress note on 05/23/2018. Social History   Socioeconomic History  . Marital status: Divorced    Spouse name: Not on file  . Number of children: 4  . Years of education: Not on file  . Highest education level: 8th grade  Occupational History  . Not on file  Tobacco Use  . Smoking status: Current Every Day Smoker    Packs/day: 2.00    Years: 40.00    Pack years: 80.00    Types: Cigarettes  . Smokeless tobacco: Former Systems developer    Types: Chew  . Tobacco comment: Reports has tried multiple things to help quit and cut back.   Substance and Sexual Activity  . Alcohol use: No    Alcohol/week: 0.0 standard drinks  . Drug use: No  . Sexual activity: Not Currently  Other Topics Concern  . Not on file  Social History Narrative  . Not on file   Social Determinants of Health   Financial Resource Strain:   . Difficulty of Paying Living Expenses: Not on file  Food Insecurity:   . Worried About Charity fundraiser in the Last Year: Not on file  . Ran Out of Food in the Last Year: Not on file  Transportation Needs:   . Lack of Transportation (Medical): Not on file  . Lack of Transportation (Non-Medical): Not on file  Physical Activity:   . Days of Exercise per Week: Not on file  . Minutes of Exercise per Session: Not on file  Stress:   . Feeling of Stress : Not on file  Social Connections:   . Frequency of Communication with Friends and Family: Not on file  . Frequency of Social Gatherings with Friends and Family: Not on file  . Attends Religious Services: Not on file  . Active Member of Clubs or Organizations: Not on file  . Attends Theatre manager Meetings: Not on file  . Marital Status: Not on file    Allergies:  Allergies  Allergen Reactions  . Onion Anaphylaxis  . Hydrocodone   . Norco [Hydrocodone-Acetaminophen] Itching  . Hydrocodone-Acetaminophen Itching  .  Nickel Rash    Metabolic Disorder Labs: Lab Results  Component Value Date   HGBA1C 11.7 (A) 03/03/2019   MPG 294.83 05/31/2017   No results found for: PROLACTIN Lab Results  Component Value Date   CHOL 134 07/15/2015   TRIG 227 (H) 07/15/2015   HDL 28 (L) 07/15/2015   CHOLHDL 4.8 07/15/2015   VLDL 26 12/09/2012   LDLCALC 61 07/15/2015   LDLCALC 96 12/09/2012   Lab Results  Component Value Date   TSH 4.120 06/10/2018   TSH 2.000 07/15/2015    Therapeutic Level Labs: No results found for: LITHIUM No results found for: VALPROATE No components found for:  CBMZ  Current Medications: Current Outpatient Medications  Medication Sig Dispense Refill  . ACCU-CHEK FASTCLIX LANCETS MISC yse as directed twice a day 100 each 1  . albuterol (PROAIR HFA) 108 (90 Base) MCG/ACT inhaler Inhale 2-4 puffs by mouth Every 4-6 hours as needed for wheezing, cough, and/or shortness of breath 3 Inhaler 1  . aspirin EC 81 MG tablet Take 81 mg by mouth daily at 3 pm.     . atorvastatin (LIPITOR) 10 MG tablet Take 1 tablet (10 mg total) by mouth daily. 90 tablet 1  . dapagliflozin propanediol (FARXIGA) 10 MG TABS tablet Take 10 mg by mouth daily. 30 tablet 1  . escitalopram (LEXAPRO) 20 MG tablet Take 1 tablet (20 mg total) by mouth daily. 30 tablet 2  . famotidine (PEPCID) 20 MG tablet Take 1 tablet (20 mg total) by mouth 2 (two) times daily. 60 tablet 3  . gabapentin (NEURONTIN) 100 MG capsule TAKE 1 CAPSULE BY MOUTH THREE TIMES DAILY 90 capsule 2  . glucose blood (ACCU-CHEK AVIVA PLUS) test strip Blood sugar testing TID and as needed. E11.65 100 each 12  . Insulin Glargine, 2 Unit Dial, (TOUJEO MAX SOLOSTAR) 300 UNIT/ML SOPN Inject 60 Units into the skin daily.  (Patient taking differently: Inject 64 Units into the skin at bedtime. ) 3 pen 5  . insulin lispro (HUMALOG KWIKPEN) 100 UNIT/ML KwikPen Use as directed per  sliding scale max units 8-10 per meal (Patient taking differently: Inject 12-15 Units into the skin 3 (three) times daily between meals. Sliding Scale Insulin) 15 mL 2  . metoprolol tartrate (LOPRESSOR) 100 MG tablet Take 1 tablet (100 mg total) by mouth 2 (two) times daily. 180 tablet 0  . mometasone-formoterol (DULERA) 100-5 MCG/ACT AERO Inhale 2 puffs into the lungs 2 (two) times daily. 13 g 2  . nitroGLYCERIN (NITROSTAT) 0.4 MG SL tablet Place 0.4 mg under the tongue every 5 (five) minutes x 3 doses as needed for chest pain.     . pantoprazole (PROTONIX) 40 MG tablet Take 1 tablet (40 mg total) by mouth daily. 30 tablet 5  . predniSONE (DELTASONE) 10 MG tablet Use per dose pack 21 tablet 0  . sitaGLIPtin-metformin (JANUMET) 50-500 MG tablet Take 1 tablet by mouth 2 (two) times daily. 60 tablet 2  . temazepam (RESTORIL) 30 MG capsule Take 1 capsule (30 mg total) by mouth at bedtime as needed for sleep. 30 capsule 3   No current facility-administered medications for this visit.     Musculoskeletal: Strength & Muscle Tone: UTA Gait & Station: normal Patient leans: N/A  Psychiatric Specialty Exam: Review of Systems  Psychiatric/Behavioral: Negative for agitation, behavioral problems, confusion, decreased concentration, dysphoric mood, hallucinations, self-injury, sleep disturbance and suicidal ideas. The patient is not nervous/anxious and is not hyperactive.   All other systems reviewed and  are negative.   There were no vitals taken for this visit.There is no height or weight on file to calculate BMI.  General Appearance: Casual  Eye Contact:  Fair  Speech:  Clear and Coherent  Volume:  Normal  Mood:  Euthymic  Affect:  Congruent  Thought Process:  Goal Directed and Descriptions of Associations: Intact  Orientation:  Full (Time,  Place, and Person)  Thought Content: Logical   Suicidal Thoughts:  No  Homicidal Thoughts:  No  Memory:  Immediate;   Fair Recent;   Fair Remote;   Fair  Judgement:  Fair  Insight:  Fair  Psychomotor Activity:  Normal  Concentration:  Concentration: Fair and Attention Span: Fair  Recall:  [employer] of Knowledge: Fair  Language: Fair  Akathisia:  No  Handed:  Right  AIMS (if indicated): UTA  Assets:  Communication Skills Desire for Improvement Housing Social Support  ADL's:  Intact  Cognition: WNL  Sleep:  Fair   Screenings: Mini-Mental     Clinical Support from 10/08/2018 in [facility], [place] from 10/05/2017 in [facility], [facility]  Total Score (max 30 points )  27  30    PHQ2-9     Office Visit from 03/03/2019 in [facility], [facility] Office Visit from 01/16/2019 in [facility], [facility] Office Visit from 11/15/2018 in [facility] [place] from 10/08/2018 in [facility], [facility] Office Visit from 07/04/2018 in [facility], [facility]  PHQ-2 Total Score  0  0  0  0  0       Assessment and Plan: Tadeus is a 54 year old Caucasian male, disabled, lives in [place], divorced, has a history of PTSD, MDD, insomnia, diabetes melitis, 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  Patient is biologically predisposed given his multiple medical problems, history of trauma.  Patient with psychosocial stressors of several deaths in the family.  He is currently making progress with regards to his mood symptoms.  Plan as noted below.  Plan MDD-in partial remission Lexapro 20 mg p.o. daily  PTSD-improving Continue CBT with Ms. Alden Hipp Lexapro as prescribed  Insomnia-stable Temazepam 30 mg p.o. nightly Continue CPAP for OSA Discussed sleep hygiene techniques  Tobacco use disorder-unstable Patient is not ready to quit.  Follow-up  in clinic in 2 months or sooner if needed.  I have spent atleast 20 minutes non face to face with patient today. More than 50 % of the time was spent for preparing to see the patient ( e.g., review of test, records ), ordering medications and test ,psychoeducation and supportive psychotherapy and care coordination,as well as documenting clinical information in electronic health record.  This note was generated in part or whole with voice recognition software. Voice recognition is usually quite accurate but there are transcription errors that can and very often do occur. I apologize for any typographical errors that were not detected and corrected.       Ursula Alert, MD 07/02/2019, 5:26 PM

## 2019-07-11 ENCOUNTER — Telehealth: Payer: Self-pay

## 2019-07-11 NOTE — Telephone Encounter (Signed)
Called lmom informing patient of appointment on 07/15/2019. klh 

## 2019-07-15 ENCOUNTER — Ambulatory Visit: Payer: Medicare Other | Admitting: Internal Medicine

## 2019-07-28 ENCOUNTER — Ambulatory Visit: Payer: Medicare Other | Admitting: Licensed Clinical Social Worker

## 2019-09-04 ENCOUNTER — Other Ambulatory Visit: Payer: Self-pay

## 2019-09-04 ENCOUNTER — Encounter: Payer: Self-pay | Admitting: Psychiatry

## 2019-09-04 ENCOUNTER — Telehealth (INDEPENDENT_AMBULATORY_CARE_PROVIDER_SITE_OTHER): Payer: Medicare Other | Admitting: Psychiatry

## 2019-09-04 DIAGNOSIS — F4310 Post-traumatic stress disorder, unspecified: Secondary | ICD-10-CM | POA: Diagnosis not present

## 2019-09-04 DIAGNOSIS — F3341 Major depressive disorder, recurrent, in partial remission: Secondary | ICD-10-CM

## 2019-09-04 DIAGNOSIS — F17200 Nicotine dependence, unspecified, uncomplicated: Secondary | ICD-10-CM

## 2019-09-04 DIAGNOSIS — F5105 Insomnia due to other mental disorder: Secondary | ICD-10-CM | POA: Diagnosis not present

## 2019-09-04 MED ORDER — TEMAZEPAM 30 MG PO CAPS: 30.0000 mg | ORAL_CAPSULE | Freq: Every evening | ORAL | 3 refills | Status: DC | PRN

## 2019-09-04 MED ORDER — HYDROXYZINE HCL 10 MG PO TABS: 10.0000 mg | ORAL_TABLET | Freq: Every evening | ORAL | 1 refills | Status: DC | PRN

## 2019-09-04 NOTE — Progress Notes (Signed)
Provider Location : [facility] Patient Location : Home  Virtual Visit via Video Note  I connected with Raymond [name] on 09/04/19 at 10:00 AM EDT by a video enabled telemedicine application and verified that I am speaking with the correct person using two identifiers.   I discussed the limitations of evaluation and management by telemedicine and the availability of in person appointments. The patient expressed understanding and agreed to proceed.   I discussed the assessment and treatment plan with the patient. The patient was provided an opportunity to ask questions and all were answered. The patient agreed with the plan and demonstrated an understanding of the instructions.   The patient was advised to call back or seek an in-person evaluation if the symptoms worsen or if the condition fails to improve as anticipated.    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  Patient today reports he is overall doing well with regards to his mood.  He reports that the social isolation due to the pandemic does make him sad often.  He however has been trying to spend time with his brother who comes over as well as his children who live close by.  Patient reports he continues to struggle with health problems more so because of his diabetes.  He continues to struggle with neuropathy.  He continues to follow-up with his providers who are managing it for him.  He reports he does have restless sleep on and off.  This may happen 1-2 times a week.  He reports the temazepam helps him to fall asleep.  Patient however reports if he wakes up in the middle of the night he  is unable to fall back asleep and this may happen couple of times a week.  Patient denies any suicidality, homicidality or perceptual disturbances.  He continues to smoke cigarettes and reports he is not willing to quit at this time.  He is currently trying to follow a strict diet for his diabetes.  Patient reports since he is doing that the only way he can cope is by smoking his cigarettes.  Patient denies any other concerns today.  Visit Diagnosis:    ICD-10-CM   1. PTSD (post-traumatic stress disorder)  F43.10   2. MDD (major depressive disorder), recurrent, in partial remission ([place])  F33.41   3. Insomnia due to mental disorder  F51.05 hydrOXYzine (ATARAX/VISTARIL) 10 MG tablet    temazepam (RESTORIL) 30 MG capsule  4. Tobacco use disorder  F17.200     Past Psychiatric History: I have reviewed past psychiatric history from my progress note on 05/23/2018.  Past trials of Prozac, Zoloft, Celexa, Tegretol, doxepin, Klonopin, Seroquel, mirtazapine, Belsomra, Lunesta, Ativan.,  Rozerem  Past Medical History:  Past Medical History:  Diagnosis Date  . Anxiety   . Arthritis    NECK  . Bronchitis   . Depression   . Diabetes mellitus without complication ([place])   . 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 Dyspnea    DUE TO GALLBLADDER PER PT  . Family history of adverse reaction to anesthesia    N/V, TROUBLE BREATHING COMING OUT OF ANESTHESIA  . Fatty liver   .  GERD (gastroesophageal reflux disease)   . Headache    MIGRAINES  . Hyperlipidemia   . Hypertension   . Irregular heart beat unk  . Myocardial infarction ([place]) 2010   MILD   . PTSD (post-traumatic stress disorder)   . Sleep apnea    USES CPAP  . Tachycardia     Past Surgical History:  Procedure Laterality Date  .  ingrown toenail removal    . CHOLECYSTECTOMY N/A 01/23/2019   Procedure: LAPAROSCOPIC CHOLECYSTECTOMY;  Surgeon: Olean Ree, MD;  Location: [facility] ORS;  Service: General;  Laterality: N/A;  . CLAVICLE  SURGERY Right   . COLONOSCOPY  2017  . MOUTH SURGERY     REMOVED ALL TEETH  . SHOULDER SURGERY Right   . UPPER GI ENDOSCOPY  2017    Family Psychiatric History: I have reviewed family psychiatric history from my progress note on 05/23/2018  Family History:  Family History  Problem Relation Age of Onset  . Hypertension Mother   . Diabetes type II Mother   . Diabetes Mother   . COPD Father   . Cancer Father   . Heart disease Father   . Diabetes Sister   . Anxiety disorder Sister   . Depression Sister   . Diabetes Brother   . Anxiety disorder Brother   . Depression Brother   . Diabetes Maternal Aunt   . Diabetes Sister   . Anxiety disorder Sister   . Depression Sister   . Diabetes Sister   . Anxiety disorder Sister   . Depression Sister   . Diabetes Sister   . Anxiety disorder Sister   . Depression Sister   . Colon cancer Maternal Uncle     Social History: I have reviewed social history from my progress note on 05/23/2018 Social History   Socioeconomic History  . Marital status: Divorced    Spouse name: Not on file  . Number of children: 4  . Years of education: Not on file  . Highest education level: 8th grade  Occupational History  . Not on file  Tobacco Use  . Smoking status: Current Every Day Smoker    Packs/day: 2.00    Years: 40.00    Pack years: 80.00    Types: Cigarettes  . Smokeless tobacco: Former Systems developer    Types: Chew  . Tobacco comment: Reports has tried multiple things to help quit and cut back.   Substance and Sexual Activity  . Alcohol use: No    Alcohol/week: 0.0 standard drinks  . Drug use: No  . Sexual activity: Not Currently  Other Topics Concern  . Not on file  Social History Narrative  . Not on file   Social Determinants of Health   Financial Resource Strain:   . Difficulty of Paying Living Expenses:   Food Insecurity:   . Worried About Charity fundraiser in the Last Year:   . Arboriculturist in the Last Year:   Transportation  Needs:   . Film/video editor (Medical):   Marland Kitchen Lack of Transportation (Non-Medical):   Physical Activity:   . Days of Exercise per Week:   . Minutes of Exercise per Session:   Stress:   . Feeling of Stress :   Social Connections:   . Frequency of Communication with Friends and Family:   . Frequency of Social Gatherings with Friends and Family:   . Attends Religious Services:   . Active Member of Clubs or Organizations:   . Attends  Club or Organization Meetings:   Marland Kitchen Marital Status:     Allergies:  Allergies  Allergen Reactions  . Onion Anaphylaxis  . Hydrocodone   . Norco [Hydrocodone-Acetaminophen] Itching  . Hydrocodone-Acetaminophen Itching  . Nickel Rash    Metabolic Disorder Labs: Lab Results  Component Value Date   HGBA1C 11.7 (A) 03/03/2019   MPG 294.83 05/31/2017   No results found for: PROLACTIN Lab Results  Component Value Date   CHOL 134 07/15/2015   TRIG 227 (H) 07/15/2015   HDL 28 (L) 07/15/2015   CHOLHDL 4.8 07/15/2015   VLDL 26 12/09/2012   LDLCALC 61 07/15/2015   LDLCALC 96 12/09/2012   Lab Results  Component Value Date   TSH 4.120 06/10/2018   TSH 2.000 07/15/2015    Therapeutic Level Labs: No results found for: LITHIUM No results found for: VALPROATE No components found for:  CBMZ  Current Medications: Current Outpatient Medications  Medication Sig Dispense Refill  . ACCU-CHEK FASTCLIX LANCETS MISC yse as directed twice a day 100 each 1  . albuterol (PROAIR HFA) 108 (90 Base) MCG/ACT inhaler Inhale 2-4 puffs by mouth Every 4-6 hours as needed for wheezing, cough, and/or shortness of breath 3 Inhaler 1  . aspirin EC 81 MG tablet Take 81 mg by mouth daily at 3 pm.     . atorvastatin (LIPITOR) 10 MG tablet Take 1 tablet (10 mg total) by mouth daily. 90 tablet 1  . dapagliflozin propanediol (FARXIGA) 10 MG TABS tablet Take 10 mg by mouth daily. 30 tablet 1  . escitalopram (LEXAPRO) 20 MG tablet Take 1 tablet (20 mg total) by mouth daily.  30 tablet 2  . famotidine (PEPCID) 20 MG tablet Take 1 tablet (20 mg total) by mouth 2 (two) times daily. 60 tablet 3  . gabapentin (NEURONTIN) 100 MG capsule TAKE 1 CAPSULE BY MOUTH THREE TIMES DAILY 90 capsule 2  . glucose blood (ACCU-CHEK AVIVA PLUS) test strip Blood sugar testing TID and as needed. E11.65 100 each 12  . hydrOXYzine (ATARAX/VISTARIL) 10 MG tablet Take 1-2 tablets (10-20 mg total) by mouth at bedtime as needed. FOR SLEEP 30 tablet 1  . Insulin Glargine, 2 Unit Dial, (TOUJEO MAX SOLOSTAR) 300 UNIT/ML SOPN Inject 60 Units into the skin daily. (Patient taking differently: Inject 64 Units into the skin at bedtime. ) 3 pen 5  . insulin lispro (HUMALOG KWIKPEN) 100 UNIT/ML KwikPen Use as directed per  sliding scale max units 8-10 per meal (Patient taking differently: Inject 12-15 Units into the skin 3 (three) times daily between meals. Sliding Scale Insulin) 15 mL 2  . metoprolol tartrate (LOPRESSOR) 100 MG tablet Take 1 tablet (100 mg total) by mouth 2 (two) times daily. 180 tablet 0  . mometasone-formoterol (DULERA) 100-5 MCG/ACT AERO Inhale 2 puffs into the lungs 2 (two) times daily. 13 g 2  . nitroGLYCERIN (NITROSTAT) 0.4 MG SL tablet Place 0.4 mg under the tongue every 5 (five) minutes x 3 doses as needed for chest pain.     . pantoprazole (PROTONIX) 40 MG tablet Take 1 tablet (40 mg total) by mouth daily. 30 tablet 5  . predniSONE (DELTASONE) 10 MG tablet Use per dose pack 21 tablet 0  . sitaGLIPtin-metformin (JANUMET) 50-500 MG tablet Take 1 tablet by mouth 2 (two) times daily. 60 tablet 2  . temazepam (RESTORIL) 30 MG capsule Take 1 capsule (30 mg total) by mouth at bedtime as needed for sleep. 30 capsule 3   No current facility-administered medications  for this visit.     Musculoskeletal: Strength & Muscle Tone: UTA Gait & Station: normal Patient leans: N/A  Psychiatric Specialty Exam: Review of Systems  Neurological: Positive for numbness (of his toes ).   Psychiatric/Behavioral: Positive for sleep disturbance.  All other systems reviewed and are negative.   There were no vitals taken for this visit.There is no height or weight on file to calculate BMI.  General Appearance: Casual  Eye Contact:  Fair  Speech:  Clear and Coherent  Volume:  Normal  Mood:  Euthymic  Affect:  Congruent  Thought Process:  Goal Directed and Descriptions of Associations: Intact  Orientation:  Full (Time, Place, and Person)  Thought Content: Logical   Suicidal Thoughts:  No  Homicidal Thoughts:  No  Memory:  Immediate;   Fair Recent;   Fair Remote;   Fair  Judgement:  Fair  Insight:  Fair  Psychomotor Activity:  Normal  Concentration:  Concentration: Fair and Attention Span: Fair  Recall:  [employer] of Knowledge: Fair  Language: Fair  Akathisia:  No  Handed:  Right  AIMS (if indicated): UTA  Assets:  Communication Skills Desire for Improvement Housing Social Support  ADL's:  Intact  Cognition: WNL  Sleep:  restless on and off   Screenings: Mini-Mental     Clinical Support from 10/08/2018 in [facility], [place] from 10/05/2017 in [facility], [facility]  Total Score (max 30 points )  27  30    PHQ2-9     Office Visit from 03/03/2019 in [facility], [facility] Office Visit from 01/16/2019 in [facility], [facility] Office Visit from 11/15/2018 in [facility], [place] from 10/08/2018 in [facility], [facility] Office Visit from 07/04/2018 in [facility], [facility]  PHQ-2 Total Score  0  0  0  0  0       Assessment and Plan: EPIFANIO DUCHATEAU  Is a 54 year old Caucasian male, disabled, lives in [place], divorced, has a history of PTSD, MDD, insomnia, diabetes melitis, 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  Patient is biologically predisposed given his multiple medical problems, history of trauma.  Patient  with psychosocial stressors of several deaths in the family, his health issues.  Patient is currently making progress however continues to struggle with sleep.  Plan as noted below.  Plan MDD in partial remission Lexapro 20 mg p.o. daily  PTSD-improving CBT as needed Lexapro as prescribed  Insomnia-restless Temazepam 30 mg p.o. nightly Continue CPAP for OSA Start hydroxyzine 10 to 20 mg p.o. nightly as needed 1-2 times a week if he wakes up in the middle of the night.  Tobacco use disorder-unstable Provided counseling.  Follow-up in clinic in 6 to 8 weeks or sooner if needed.  I have spent atleast 20 minutes non face to face with patient today. More than 50 % of the time was spent for preparing to see the patient ( e.g., review of test, records ), ordering medications and test ,psychoeducation and supportive psychotherapy and care coordination,as well as documenting clinical information in electronic health record. This note was generated in part or whole with voice recognition software. Voice recognition is usually quite accurate but there are transcription errors that can and very often do occur. I apologize for any typographical errors that were not detected and corrected.       Ursula Alert, MD 09/04/2019, 12:23 PM

## 2019-09-30 ENCOUNTER — Other Ambulatory Visit: Payer: Self-pay

## 2019-09-30 DIAGNOSIS — I10 Essential (primary) hypertension: Secondary | ICD-10-CM

## 2019-09-30 MED ORDER — METOPROLOL TARTRATE 100 MG PO TABS: 100.0000 mg | ORAL_TABLET | Freq: Two times a day (BID) | ORAL | 0 refills | Status: DC

## 2019-10-15 ENCOUNTER — Telehealth: Payer: Self-pay

## 2019-10-15 NOTE — Telephone Encounter (Signed)
Confirmed and screened for 10-17-19 ov. Has not been vaccinated.

## 2019-10-17 ENCOUNTER — Other Ambulatory Visit: Payer: Self-pay

## 2019-10-17 ENCOUNTER — Encounter: Payer: Self-pay | Admitting: Nurse Practitioner

## 2019-10-17 ENCOUNTER — Ambulatory Visit (INDEPENDENT_AMBULATORY_CARE_PROVIDER_SITE_OTHER): Payer: Medicare Other | Admitting: Nurse Practitioner

## 2019-10-17 VITALS — BP 141/87 | HR 82 | Temp 97.6°F | Resp 16 | Ht 69.0 in | Wt 224.0 lb

## 2019-10-17 DIAGNOSIS — B351 Tinea unguium: Secondary | ICD-10-CM

## 2019-10-17 DIAGNOSIS — F17210 Nicotine dependence, cigarettes, uncomplicated: Secondary | ICD-10-CM | POA: Diagnosis not present

## 2019-10-17 DIAGNOSIS — R300 Dysuria: Secondary | ICD-10-CM | POA: Diagnosis not present

## 2019-10-17 DIAGNOSIS — E1165 Type 2 diabetes mellitus with hyperglycemia: Secondary | ICD-10-CM | POA: Diagnosis not present

## 2019-10-17 DIAGNOSIS — L600 Ingrowing nail: Secondary | ICD-10-CM

## 2019-10-17 DIAGNOSIS — Z0001 Encounter for general adult medical examination with abnormal findings: Secondary | ICD-10-CM | POA: Diagnosis not present

## 2019-10-17 DIAGNOSIS — I10 Essential (primary) hypertension: Secondary | ICD-10-CM | POA: Diagnosis not present

## 2019-10-17 LAB — POCT GLYCOSYLATED HEMOGLOBIN (HGB A1C): Hemoglobin A1C: 8.6 % — AB (ref 4.0–5.6)

## 2019-10-17 MED ORDER — DAPAGLIFLOZIN PROPANEDIOL 10 MG PO TABS: 10.0000 mg | ORAL_TABLET | Freq: Every day | ORAL | 3 refills | Status: DC

## 2019-10-17 NOTE — Progress Notes (Signed)
[facility] [place], [place] [address]  Internal MEDICINE  Office Visit Note  Patient Name: Raymond [name]  [id number]  [id number]  Date of Service: 10/26/2019   Pt is here for routine health maintenance examination   Chief Complaint  Patient presents with  . Medicare Wellness  . Diabetes  . Depression  . Gastroesophageal Reflux  . Hypertension  . Hyperlipidemia  . Quality Metric Gaps    Opthamology  . Foot Problem    Toes feel numb and stiff; hurt to bend; on both feet     The patient is here for health maintenance exam. The patient is diabetic. Currently taking toujeo 64 units daily and using sliding scale for mealtime coverage. He takes janumet by mouth. Should be taking farxiga 10mg , but states that he has not been taking that. In fact, he is not sure if that was ever filled. His HgbA1c is 8.6 today which is big improvement from 11.7 at the most recent check. He is due to have diabetic eye exam. He would also like to be referred to podiatry again. Is having trouble with thick, ingrowing toenails on both feet.  The patient states that he continues to smoke. Has cut back on his smoking, but still smoking about 1.5 packs of cigarettes per day.     Current Medication: Outpatient Encounter Medications as of 10/17/2019  Medication Sig  . ACCU-CHEK FASTCLIX LANCETS MISC yse as directed twice a day  . albuterol (PROAIR HFA) 108 (90 Base) MCG/ACT inhaler Inhale 2-4 puffs by mouth Every 4-6 hours as needed for wheezing, cough, and/or shortness of breath  . aspirin EC 81 MG tablet Take 81 mg by mouth daily at 3 pm.   . atorvastatin (LIPITOR) 10 MG tablet Take 1 tablet (10 mg total) by mouth daily.  . dapagliflozin propanediol (FARXIGA) 10 MG TABS tablet Take 1 tablet (10 mg total) by mouth daily.  Marland Kitchen escitalopram (LEXAPRO) 20 MG tablet Take 1 tablet (20 mg total) by mouth daily.  . famotidine (PEPCID) 20 MG tablet Take 1 tablet (20 mg total) by mouth 2  (two) times daily.  Marland Kitchen gabapentin (NEURONTIN) 100 MG capsule TAKE 1 CAPSULE BY MOUTH THREE TIMES DAILY  . glucose blood (ACCU-CHEK AVIVA PLUS) test strip Blood sugar testing TID and as needed. E11.65  . hydrOXYzine (ATARAX/VISTARIL) 10 MG tablet Take 1-2 tablets (10-20 mg total) by mouth at bedtime as needed. FOR SLEEP  . Insulin Glargine, 2 Unit Dial, (TOUJEO MAX SOLOSTAR) 300 UNIT/ML SOPN Inject 60 Units into the skin daily. (Patient taking differently: Inject 64 Units into the skin at bedtime. )  . insulin lispro (HUMALOG KWIKPEN) 100 UNIT/ML KwikPen Use as directed per  sliding scale max units 8-10 per meal (Patient taking differently: Inject 12-15 Units into the skin 3 (three) times daily between meals. Sliding Scale Insulin)  . metoprolol tartrate (LOPRESSOR) 100 MG tablet Take 1 tablet (100 mg total) by mouth 2 (two) times daily.  . mometasone-formoterol (DULERA) 100-5 MCG/ACT AERO Inhale 2 puffs into the lungs 2 (two) times daily.  . nitroGLYCERIN (NITROSTAT) 0.4 MG SL tablet Place 0.4 mg under the tongue every 5 (five) minutes x 3 doses as needed for chest pain.   . pantoprazole (PROTONIX) 40 MG tablet Take 1 tablet (40 mg total) by mouth daily.  . predniSONE (DELTASONE) 10 MG tablet Use per dose pack  . sitaGLIPtin-metformin (JANUMET) 50-500 MG tablet Take 1 tablet by mouth 2 (two) times daily.  . temazepam (  RESTORIL) 30 MG capsule Take 1 capsule (30 mg total) by mouth at bedtime as needed for sleep.  . [DISCONTINUED] dapagliflozin propanediol (FARXIGA) 10 MG TABS tablet Take 10 mg by mouth daily.   No facility-administered encounter medications on file as of 10/17/2019.    Surgical History: Past Surgical History:  Procedure Laterality Date  .  ingrown toenail removal    . CHOLECYSTECTOMY N/A 01/23/2019   Procedure: LAPAROSCOPIC CHOLECYSTECTOMY;  Surgeon: Olean Ree, MD;  Location: [facility] ORS;  Service: General;  Laterality: N/A;  . CLAVICLE SURGERY Right   . COLONOSCOPY  2017  .  MOUTH SURGERY     REMOVED ALL TEETH  . SHOULDER SURGERY Right   . UPPER GI ENDOSCOPY  2017    Medical History: Past Medical History:  Diagnosis Date  . Anxiety   . Arthritis    NECK  . Bronchitis   . Depression   . Diabetes mellitus without complication ([place])   . 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 Dyspnea    DUE TO GALLBLADDER PER PT  . Family history of adverse reaction to anesthesia    N/V, TROUBLE BREATHING COMING OUT OF ANESTHESIA  . Fatty liver   . GERD (gastroesophageal reflux disease)   . Headache    MIGRAINES  . Hyperlipidemia   . Hypertension   . Irregular heart beat unk  . Myocardial infarction ([place]) 2010   MILD   . PTSD (post-traumatic stress disorder)   . Sleep apnea    USES CPAP  . Tachycardia     Family History: Family History  Problem Relation Age of Onset  . Hypertension Mother   . Diabetes type II Mother   . Diabetes Mother   . COPD Father   . Cancer Father   . Heart disease Father   . Diabetes Sister   . Anxiety disorder Sister   . Depression Sister   . Diabetes Brother   . Anxiety disorder Brother   . Depression Brother   . Diabetes Maternal Aunt   . Diabetes Sister   . Anxiety disorder Sister   . Depression Sister   . Diabetes Sister   . Anxiety disorder Sister   . Depression Sister   . Diabetes Sister   . Anxiety disorder Sister   . Depression Sister   . Colon cancer Maternal Uncle       Review of Systems  Constitutional: Negative for activity change, chills, fatigue and unexpected weight change.  HENT: Negative for congestion, postnasal drip, rhinorrhea, sneezing and sore throat.   Respiratory: Negative for cough, chest tightness, shortness of breath and wheezing.   Cardiovascular: Negative for chest pain and palpitations.  Gastrointestinal: Negative for abdominal pain, constipation, diarrhea, nausea and vomiting.  Endocrine: Negative for cold intolerance, heat intolerance, polydipsia and polyuria.       Blood  sugar improved from most recent visit.   Genitourinary: Negative for dysuria and frequency.  Musculoskeletal: Negative for arthralgias, back pain, joint swelling and neck pain.  Skin: Negative for rash.  Neurological: Negative.  Negative for tremors and numbness.  Hematological: Negative for adenopathy. Does not bruise/bleed easily.  Psychiatric/Behavioral: Negative for behavioral problems (Depression), sleep disturbance and suicidal ideas. The patient is not nervous/anxious.    Today's Vitals   10/17/19 0958  BP: (!) 141/87  Pulse: 82  Resp: 16  Temp: 97.6 F (36.4 C)  SpO2: 96%  Weight: 224 lb (101.6 kg)  Height: 5\' 9"  (1.753 m)   Body mass index is 33.08  kg/m.  Physical Exam Vitals and nursing note reviewed.  Constitutional:      General: He is not in acute distress.    Appearance: Normal appearance. He is well-developed. He is not diaphoretic.  HENT:     Head: Normocephalic and atraumatic.     Nose: Nose normal.     Mouth/Throat:     Pharynx: No oropharyngeal exudate.  Eyes:     Pupils: Pupils are equal, round, and reactive to light.  Neck:     Thyroid: No thyromegaly.     Vascular: No carotid bruit or JVD.     Trachea: No tracheal deviation.  Cardiovascular:     Rate and Rhythm: Normal rate and regular rhythm.     Pulses: Normal pulses.          Dorsalis pedis pulses are 2+ on the right side and 2+ on the left side.       Posterior tibial pulses are 2+ on the right side and 2+ on the left side.     Heart sounds: Normal heart sounds. No murmur heard.  No friction rub. No gallop.   Pulmonary:     Effort: Pulmonary effort is normal. No respiratory distress.     Breath sounds: Normal breath sounds. No wheezing or rales.  Chest:     Chest wall: No tenderness.  Abdominal:     General: Bowel sounds are normal.     Palpations: Abdomen is soft.     Tenderness: There is no abdominal tenderness.  Musculoskeletal:        General: Normal range of motion.     Cervical  back: Normal range of motion and neck supple.     Right foot: Normal range of motion. No deformity or bunion.     Left foot: Normal range of motion. No deformity or bunion.  Feet:     Right foot:     Protective Sensation: 10 sites tested. 10 sites sensed.     Skin integrity: Skin integrity normal.     Toenail Condition: Right toenails are abnormally thick and ingrown.     Left foot:     Protective Sensation: 10 sites tested. 10 sites sensed.     Skin integrity: Skin integrity normal.     Toenail Condition: Left toenails are abnormally thick and ingrown.  Lymphadenopathy:     Cervical: No cervical adenopathy.  Skin:    General: Skin is warm and dry.  Neurological:     General: No focal deficit present.     Mental Status: He is alert and oriented to person, place, and time.     Cranial Nerves: No cranial nerve deficit.  Psychiatric:        Mood and Affect: Mood normal.        Behavior: Behavior normal.        Thought Content: Thought content normal.        Judgment: Judgment normal.    Depression screen [facility] 2/9 03/03/2019 01/16/2019 11/15/2018 10/08/2018 07/04/2018  Decreased Interest 0 0 0 0 0  Down, Depressed, Hopeless 0 0 0 0 0  PHQ - 2 Score 0 0 0 0 0  Altered sleeping - - - - -  Tired, decreased energy - - - - -  Change in appetite - - - - -  Feeling bad or failure about yourself  - - - - -  Trouble concentrating - - - - -  Moving slowly or fidgety/restless - - - - -  Suicidal thoughts - - - - -  PHQ-9 Score - - - - -  Difficult doing work/chores - - - - -    Functional Status Survey: Is the patient deaf or have difficulty hearing?: No Does the patient have difficulty seeing, even when wearing glasses/contacts?: Yes (Has blurred vision) Does the patient have difficulty concentrating, remembering, or making decisions?: No Does the patient have difficulty walking or climbing stairs?: Yes Does the patient have difficulty dressing or bathing?: No Does the patient have  difficulty doing errands alone such as visiting a doctor's office or shopping?: No  MMSE - Mini Mental State Exam 10/17/2019 10/08/2018 10/05/2017  Orientation to time 5 5 5   Orientation to Place 5 5 5   Registration 3 0 3  Attention/ Calculation 5 5 5   Recall 3 3 3   Language- name 2 objects 2 2 2   Language- repeat 1 1 1   Language- follow 3 step command 3 3 3   Language- read & follow direction 1 1 1   Write a sentence 1 1 1   Copy design 1 1 1   Total score 30 27 30     Fall Risk  10/17/2019 03/03/2019 02/06/2019 01/17/2019 01/16/2019  Falls in the past year? 0 0 0 0 0  Number falls in past yr: - 0 - - -  Injury with Fall? - 0 - - -  Risk Factor Category  - - - - -  Risk for fall due to : - - - - -  Risk for fall due to: Comment - - - - -  Follow up - - - - -      LABS: Recent Results (from the past 2160 hour(s))  Urinalysis, Routine w reflex microscopic     Status: Abnormal   Collection Time: 10/17/19 10:00 AM  Result Value Ref Range   Specific Gravity, UA 1.022 1.005 - 1.030   pH, UA 5.5 5.0 - 7.5   Color, UA Yellow Yellow   Appearance Ur Clear Clear   Leukocytes,UA Negative Negative   Protein,UA 1+ (A) Negative/Trace   Glucose, UA 2+ (A) Negative   Ketones, UA Negative Negative   RBC, UA Negative Negative   Bilirubin, UA Negative Negative   Urobilinogen, Ur 0.2 0.2 - 1.0 mg/dL   Nitrite, UA Negative Negative   Microscopic Examination See below:     Comment: Microscopic was indicated and was performed.  Microscopic Examination     Status: None   Collection Time: 10/17/19 10:00 AM   Urine  Result Value Ref Range   WBC, UA None seen 0 - 5 /hpf   RBC None seen 0 - 2 /hpf   Epithelial Cells (non renal) None seen 0 - 10 /hpf   Casts None seen None seen /lpf   Bacteria, UA None seen None seen/Few  POCT HgB A1C     Status: Abnormal   Collection Time: 10/17/19 10:27 AM  Result Value Ref Range   Hemoglobin A1C 8.6 (A) 4.0 - 5.6 %   HbA1c POC (<> result, manual entry)      HbA1c, POC (prediabetic range)     HbA1c, POC (controlled diabetic range)      Assessment/Plan:  1. Encounter for general adult medical examination with abnormal findings Annual health maintenance exam today.   2. Uncontrolled type 2 diabetes mellitus with hyperglycemia (HCC) - POCT HgB A1C 8.6 today. Add back farxiga 10mg  daily. Continue other diabetic medications as prescribed. Refer for diabetic eye exam.  - Ambulatory referral to Ophthalmology - dapagliflozin propanediol (FARXIGA) 10 MG TABS tablet;  Take 1 tablet (10 mg total) by mouth daily.  Dispense: 30 tablet; Refill: 3  3. Onychomycosis with ingrown toenail Refer to podiatry for diabetic foot care and further evaluation and treatment.  - Ambulatory referral to Podiatry  4. Essential hypertension Stable. Continue bp medication as prescribed   5. Cigarette nicotine dependence without complication Patient continues to try and cut back his smoking. Encouraged him to continue cutting back until he eventually quits smoking altogether.   6. Dysuria - Urinalysis, Routine w reflex microscopic   General Counseling: Lauri verbalizes understanding of the findings of todays visit and agrees with plan of treatment. I have discussed any further diagnostic evaluation that may be needed or ordered today. We also reviewed his medications today. he has been encouraged to call the office with any questions or concerns that should arise related to todays visit.    Counseling:  Diabetes Counseling:  1. Addition of ACE inh/ ARB'S for nephroprotection. Microalbumin is updated  2. Diabetic foot care, prevention of complications. Podiatry consult 3. Exercise and lose weight.  4. Diabetic eye examination, Diabetic eye exam is updated  5. Monitor blood sugar closlely. nutrition counseling.  6. Sign and symptoms of hypoglycemia including shaking sweating,confusion and headaches.  This patient was seen by Leretha Pol FNP Collaboration with  Dr Lavera Guise as a part of collaborative care agreement  Orders Placed This Encounter  Procedures  . Microscopic Examination  . Urinalysis, Routine w reflex microscopic  . Ambulatory referral to Ophthalmology  . Ambulatory referral to Podiatry  . POCT HgB A1C    Meds ordered this encounter  Medications  . dapagliflozin propanediol (FARXIGA) 10 MG TABS tablet    Sig: Take 1 tablet (10 mg total) by mouth daily.    Dispense:  30 tablet    Refill:  3    Order Specific Question:   Supervising Provider    Answer:   Lavera Guise [[id number]]    Total time spent: 39 Minutes  Time spent includes review of chart, medications, test results, and follow up plan with the patient.     Lavera Guise, MD  Internal Medicine

## 2019-10-18 LAB — URINALYSIS, ROUTINE W REFLEX MICROSCOPIC
Bilirubin, UA: NEGATIVE
Ketones, UA: NEGATIVE
Leukocytes,UA: NEGATIVE
Nitrite, UA: NEGATIVE
RBC, UA: NEGATIVE
Specific Gravity, UA: 1.022 (ref 1.005–1.030)
Urobilinogen, Ur: 0.2 mg/dL (ref 0.2–1.0)
pH, UA: 5.5 (ref 5.0–7.5)

## 2019-10-18 LAB — MICROSCOPIC EXAMINATION
Bacteria, UA: NONE SEEN
Casts: NONE SEEN /lpf
Epithelial Cells (non renal): NONE SEEN /hpf (ref 0–10)
RBC, Urine: NONE SEEN /hpf (ref 0–2)
WBC, UA: NONE SEEN /hpf (ref 0–5)

## 2019-10-30 ENCOUNTER — Encounter: Payer: Self-pay | Admitting: Podiatry

## 2019-10-30 ENCOUNTER — Telehealth (INDEPENDENT_AMBULATORY_CARE_PROVIDER_SITE_OTHER): Payer: Medicare Other | Admitting: Psychiatry

## 2019-10-30 ENCOUNTER — Other Ambulatory Visit: Payer: Self-pay

## 2019-10-30 ENCOUNTER — Ambulatory Visit (INDEPENDENT_AMBULATORY_CARE_PROVIDER_SITE_OTHER): Payer: Medicare Other | Admitting: Podiatry

## 2019-10-30 ENCOUNTER — Encounter: Payer: Self-pay | Admitting: Psychiatry

## 2019-10-30 DIAGNOSIS — F3341 Major depressive disorder, recurrent, in partial remission: Secondary | ICD-10-CM | POA: Insufficient documentation

## 2019-10-30 DIAGNOSIS — M79676 Pain in unspecified toe(s): Secondary | ICD-10-CM

## 2019-10-30 DIAGNOSIS — E0843 Diabetes mellitus due to underlying condition with diabetic autonomic (poly)neuropathy: Secondary | ICD-10-CM | POA: Diagnosis not present

## 2019-10-30 DIAGNOSIS — F17200 Nicotine dependence, unspecified, uncomplicated: Secondary | ICD-10-CM | POA: Diagnosis not present

## 2019-10-30 DIAGNOSIS — F4310 Post-traumatic stress disorder, unspecified: Secondary | ICD-10-CM | POA: Diagnosis not present

## 2019-10-30 DIAGNOSIS — B351 Tinea unguium: Secondary | ICD-10-CM | POA: Diagnosis not present

## 2019-10-30 DIAGNOSIS — F3342 Major depressive disorder, recurrent, in full remission: Secondary | ICD-10-CM | POA: Diagnosis not present

## 2019-10-30 DIAGNOSIS — F5105 Insomnia due to other mental disorder: Secondary | ICD-10-CM | POA: Diagnosis not present

## 2019-10-30 MED ORDER — NICOTINE 14 MG/24HR TD PT24: 14.0000 mg | MEDICATED_PATCH | Freq: Every day | TRANSDERMAL | 0 refills | Status: DC

## 2019-10-30 MED ORDER — ESCITALOPRAM OXALATE 20 MG PO TABS: 20.0000 mg | ORAL_TABLET | Freq: Every day | ORAL | 2 refills | Status: DC

## 2019-10-30 MED ORDER — HYDROXYZINE HCL 10 MG PO TABS: 10.0000 mg | ORAL_TABLET | Freq: Every evening | ORAL | 2 refills | Status: DC | PRN

## 2019-10-30 NOTE — Progress Notes (Signed)
This patient returns to my office for at risk foot care.  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  He says he has previously been treated with gabapentin.  He says the neuropathy has been worsening.   This patient is unable to cut nails himself since the patient cannot reach his nails.These nails are painful walking and wearing shoes.  This patient presents for at risk foot care today.  General Appearance  Alert, conversant and in no acute stress.  Vascular  Dorsalis pedis and posterior tibial  pulses are palpable  bilaterally.  Capillary return is within normal limits  bilaterally. Temperature is within normal limits  bilaterally.  Neurologic  Senn-Weinstein monofilament wire test within normal limits  bilaterally. Muscle power within normal limits bilaterally.  Nails Thick disfigured discolored nails with subungual debris  from hallux to fifth toes bilaterally.  Pincer nails noted.  No evidence of bacterial infection or drainage bilaterally.  Orthopedic  No limitations of motion  feet .  No crepitus or effusions noted.  No bony pathology or digital deformities noted.  Skin  normotropic skin with no porokeratosis noted bilaterally.  No signs of infections or ulcers noted.     Onychomycosis  Pain in right toes  Pain in left toes  Consent was obtained for treatment procedures.   Mechanical debridement of nails 1-5  bilaterally performed with a nail nipper.  Filed with dremel without incident.  Discussed neuropathy with this patient.  Told this patient he needs to make an appointment with Dr. Amalia Hailey for further evaluation of the medication to control his neuropathy or make an appointment with his medical doctor.  Return to the clinic in 3 months for nail care.   Return office visit    3 months                  Told patient to return for periodic  foot care and evaluation due to potential at risk complications.   Gardiner Barefoot DPM

## 2019-10-30 NOTE — Progress Notes (Signed)
Provider Location : [facility] Patient Location : Home  Virtual Visit via Video Note  I connected with Raymond [name] on 10/30/19 at  9:40 AM EDT by a video enabled telemedicine application and verified that I am speaking with the correct person using two identifiers.   I discussed the limitations of evaluation and management by telemedicine and the availability of in person appointments. The patient expressed understanding and agreed to proceed.    I discussed the assessment and treatment plan with the patient. The patient was provided an opportunity to ask questions and all were answered. The patient agreed with the plan and demonstrated an understanding of the instructions.   The patient was advised to call back or seek an in-person evaluation if the symptoms worsen or if the condition fails to improve as anticipated.   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  Patient today reports he is currently making progress with regards to his mood.  He denies any mood swings.  He denies any significant anxiety.  He however reports he continues to have sleep problems on and off.  He reports at least couple of times a week he struggles with sleep.  Last night he could not sleep since he had a lot of racing thoughts.  He does report situational stressors which could have contributed to the same.  He did have hydroxyzine available which does help him to go back to sleep however he did not take it last night.  He currently takes temazepam as well as melatonin.  He does have neuropathy as well as pain due to that which could also be contributing to his difficulty  falling asleep on and off.  He is scheduled to see a specialist for the same soon.  Patient denies any suicidality, homicidality or perceptual disturbances.  Patient does report lightheadedness on and off due to uncontrolled diabetes.  He reports he has upcoming appointment with his provider for evaluation.  Patient continues to smoke cigarettes and is willing to cut back.  Discussed nicotine patches.  Patient is awaiting a callback from his new therapist since his previous therapist left.  Discussed with patient to contact the office to schedule an appointment.  Patient denies any other concerns today.  Visit Diagnosis:    ICD-10-CM   1. PTSD (post-traumatic stress disorder)  F43.10 escitalopram (LEXAPRO) 20 MG tablet  2. MDD (major depressive disorder), recurrent, in full remission ([place])  F33.42 escitalopram (LEXAPRO) 20 MG tablet  3. Insomnia due to mental disorder  F51.05 hydrOXYzine (ATARAX/VISTARIL) 10 MG tablet  4. Tobacco use disorder  F17.200 nicotine (NICODERM CQ - DOSED IN MG/24 HOURS) 14 mg/24hr patch    Past Psychiatric History: I have reviewed past psychiatric history from my progress note on 05/23/2018.  Past trials of Prozac, Zoloft, Celexa, Tegretol, doxepin, Klonopin, Seroquel, mirtazapine, Belsomra, Lunesta, Ativan, Rozerem.   Past Medical History:  Past Medical History:  Diagnosis Date   Anxiety    Arthritis    NECK   Bronchitis    Depression    Diabetes mellitus without complication (HCC)    Diastasis of rectus abdominis 06/19/2018   Dyspnea    DUE TO  GALLBLADDER PER PT   Family history of adverse reaction to anesthesia    N/V, TROUBLE BREATHING COMING OUT OF ANESTHESIA   Fatty liver    GERD (gastroesophageal reflux disease)    Headache    MIGRAINES   Hyperlipidemia    Hypertension    Irregular heart beat unk   Myocardial infarction ([place]) 2010   MILD    PTSD (post-traumatic stress disorder)    Sleep apnea    USES CPAP    Tachycardia     Past Surgical History:  Procedure Laterality Date    ingrown toenail removal     CHOLECYSTECTOMY N/A 01/23/2019   Procedure: LAPAROSCOPIC CHOLECYSTECTOMY;  Surgeon: Olean Ree, MD;  Location: [facility] ORS;  Service: General;  Laterality: N/A;   CLAVICLE SURGERY Right    COLONOSCOPY  2017   MOUTH SURGERY     REMOVED ALL TEETH   SHOULDER SURGERY Right    UPPER GI ENDOSCOPY  2017    Family Psychiatric History: I have reviewed family psychiatric history from my progress note on 05/23/2018  Family History:  Family History  Problem Relation Age of Onset   Hypertension Mother    Diabetes type II Mother    Diabetes Mother    COPD Father    Cancer Father    Heart disease Father    Diabetes Sister    Anxiety disorder Sister    Depression Sister    Diabetes Brother    Anxiety disorder Brother    Depression Brother    Diabetes Maternal Aunt    Diabetes Sister    Anxiety disorder Sister    Depression Sister    Diabetes Sister    Anxiety disorder Sister    Depression Sister    Diabetes Sister    Anxiety disorder Sister    Depression Sister    Colon cancer Maternal Uncle     Social History: Reviewed social history from my progress note on 05/23/2018 Social History   Socioeconomic History   Marital status: Divorced    Spouse name: Not on file   Number of children: 4   Years of education: Not on file   Highest education level: 8th grade  Occupational History   Not on file  Tobacco Use   Smoking status: Current Every Day Smoker    Packs/day: 1.50    Years: 40.00    Pack years: 60.00    Types: Cigarettes   Smokeless tobacco: Former Systems developer    Types: Chew   Tobacco comment: Reports has tried multiple things to help quit and cut back.   Vaping Use   Vaping Use: Former  Substance and Sexual Activity   Alcohol use: No    Alcohol/week: 0.0 standard drinks   Drug use: No   Sexual activity: Not Currently  Other Topics  Concern   Not on file  Social History Narrative   Not on file   Social Determinants of Health   Financial Resource Strain:    Difficulty of Paying Living Expenses:   Food Insecurity:    Worried About Charity fundraiser in the Last Year:    Arboriculturist in the Last Year:   Transportation Needs:    Film/video editor (Medical):    Lack of Transportation (Non-Medical):   Physical Activity:    Days of Exercise per Week:    Minutes of Exercise per Session:   Stress:    Feeling of Stress :   Social Connections:    Frequency of  Communication with Friends and Family:    Frequency of Social Gatherings with Friends and Family:    Attends Religious Services:    Active Member of Clubs or Organizations:    Attends Archivist Meetings:    Marital Status:     Allergies:  Allergies  Allergen Reactions   Onion Anaphylaxis   Hydrocodone    Norco [Hydrocodone-Acetaminophen] Itching   Hydrocodone-Acetaminophen Itching   Nickel Rash    Metabolic Disorder Labs: Lab Results  Component Value Date   HGBA1C 8.6 (A) 10/17/2019   MPG 294.83 05/31/2017   No results found for: PROLACTIN Lab Results  Component Value Date   CHOL 134 07/15/2015   TRIG 227 (H) 07/15/2015   HDL 28 (L) 07/15/2015   CHOLHDL 4.8 07/15/2015   VLDL 26 12/09/2012   LDLCALC 61 07/15/2015   LDLCALC 96 12/09/2012   Lab Results  Component Value Date   TSH 4.120 06/10/2018   TSH 2.000 07/15/2015    Therapeutic Level Labs: No results found for: LITHIUM No results found for: VALPROATE No components found for:  CBMZ  Current Medications: Current Outpatient Medications  Medication Sig Dispense Refill   ACCU-CHEK FASTCLIX LANCETS MISC yse as directed twice a day 100 each 1   albuterol (PROAIR HFA) 108 (90 Base) MCG/ACT inhaler Inhale 2-4 puffs by mouth Every 4-6 hours as needed for wheezing, cough, and/or shortness of breath 3 Inhaler 1   aspirin EC 81 MG tablet Take 81  mg by mouth daily at 3 pm.      atorvastatin (LIPITOR) 10 MG tablet Take 1 tablet (10 mg total) by mouth daily. 90 tablet 1   dapagliflozin propanediol (FARXIGA) 10 MG TABS tablet Take 1 tablet (10 mg total) by mouth daily. 30 tablet 3   escitalopram (LEXAPRO) 20 MG tablet Take 1 tablet (20 mg total) by mouth daily. 30 tablet 2   famotidine (PEPCID) 20 MG tablet Take 1 tablet (20 mg total) by mouth 2 (two) times daily. 60 tablet 3   gabapentin (NEURONTIN) 100 MG capsule TAKE 1 CAPSULE BY MOUTH THREE TIMES DAILY 90 capsule 2   glucose blood (ACCU-CHEK AVIVA PLUS) test strip Blood sugar testing TID and as needed. E11.65 100 each 12   hydrOXYzine (ATARAX/VISTARIL) 10 MG tablet Take 1-2 tablets (10-20 mg total) by mouth at bedtime as needed. FOR SLEEP 30 tablet 2   Insulin Glargine, 2 Unit Dial, (TOUJEO MAX SOLOSTAR) 300 UNIT/ML SOPN Inject 60 Units into the skin daily. (Patient taking differently: Inject 64 Units into the skin at bedtime. ) 3 pen 5   insulin lispro (HUMALOG KWIKPEN) 100 UNIT/ML KwikPen Use as directed per  sliding scale max units 8-10 per meal (Patient taking differently: Inject 12-15 Units into the skin 3 (three) times daily between meals. Sliding Scale Insulin) 15 mL 2   metoprolol tartrate (LOPRESSOR) 100 MG tablet Take 1 tablet (100 mg total) by mouth 2 (two) times daily. 180 tablet 0   mometasone-formoterol (DULERA) 100-5 MCG/ACT AERO Inhale 2 puffs into the lungs 2 (two) times daily. 13 g 2   nicotine (NICODERM CQ - DOSED IN MG/24 HOURS) 14 mg/24hr patch Place 1 patch (14 mg total) onto the skin daily. 28 patch 0   nitroGLYCERIN (NITROSTAT) 0.4 MG SL tablet Place 0.4 mg under the tongue every 5 (five) minutes x 3 doses as needed for chest pain.      pantoprazole (PROTONIX) 40 MG tablet Take 1 tablet (40 mg total) by mouth daily. 30 tablet  5   predniSONE (DELTASONE) 10 MG tablet Use per dose pack 21 tablet 0   sitaGLIPtin-metformin (JANUMET) 50-500 MG tablet Take 1  tablet by mouth 2 (two) times daily. 60 tablet 2   temazepam (RESTORIL) 30 MG capsule Take 1 capsule (30 mg total) by mouth at bedtime as needed for sleep. 30 capsule 3   No current facility-administered medications for this visit.     Musculoskeletal: Strength & Muscle Tone: UTA Gait & Station: normal Patient leans: N/A  Psychiatric Specialty Exam: Review of Systems  Musculoskeletal: Positive for myalgias.  Neurological: Positive for light-headedness.  Psychiatric/Behavioral: Positive for sleep disturbance.  All other systems reviewed and are negative.   There were no vitals taken for this visit.There is no height or weight on file to calculate BMI.  General Appearance: Casual  Eye Contact:  Fair  Speech:  Clear and Coherent  Volume:  Normal  Mood:  Euthymic  Affect:  Congruent  Thought Process:  Goal Directed and Descriptions of Associations: Intact  Orientation:  Full (Time, Place, and Person)  Thought Content: Logical   Suicidal Thoughts:  No  Homicidal Thoughts:  No  Memory:  Immediate;   Fair Recent;   Fair Remote;   Fair  Judgement:  Fair  Insight:  Fair  Psychomotor Activity:  Normal  Concentration:  Concentration: Fair and Attention Span: Fair  Recall:  [employer] of Knowledge: Fair  Language: Fair  Akathisia:  No  Handed:  Right  AIMS (if indicated): UTA  Assets:  Communication Skills Desire for Improvement Housing Social Support  ADL's:  Intact  Cognition: WNL  Sleep:  Poor   Screenings: Mini-Mental     Clinical Support from 10/17/2019 in [facility] [place] from 10/08/2018 in [facility], [place] from 10/05/2017 in [facility], [facility]  Total Score (max 30 points ) 30 27 30     PHQ2-9     Office Visit from 03/03/2019 in [facility], [facility] Office Visit from 01/16/2019 in [facility], [facility] Office Visit from 11/15/2018 in [facility], [place] from  10/08/2018 in [facility], [facility] Office Visit from 07/04/2018 in [facility], [facility]  PHQ-2 Total Score 0 0 0 0 0       Assessment and Plan: Raymond [name] is a 54 year old Caucasian male, disabled, lives in [place], divorced, has a history of PTSD, MDD, insomnia, diabetes melitis, 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  Patient is biologically predisposed given his multiple medical problems, history of trauma.  Patient with psychosocial stressors of several deaths in the family and his own health issues.  Patient continues to struggle with sleep and will benefit from following medication readjustment.  Plan MDD in full remission Lexapro 20 mg p.o. daily  PTSD-improving Discussed with patient to restart CBT. I have also sent communication to staff to schedule the patient with our new therapist Lexapro as prescribed  Insomnia-restless Patient is currently awaiting evaluation for his neuropathy which also has an impact on his sleep Temazepam 30 mg p.o. nightly Melatonin as needed Hydroxyzine 10 to 20 mg p.o. nightly as needed Continue CPAP for OSA  Tobacco use disorder-unstable Start nicotine patch 14 mg p.o. daily Provided counseling  Follow-up in clinic in 6 to 7 weeks or sooner if needed.  I have spent atleast 20 minutes non- face to face with patient today. More than 50 % of the time  was spent for preparing to see the patient ( e.g., review of test, records ), ordering medications and test ,psychoeducation and supportive psychotherapy and care coordination,as well as documenting clinical information in electronic health record. This note was generated in part or whole with voice recognition software. Voice recognition is usually quite accurate but there are transcription errors that can and very often do occur. I apologize for any typographical errors that were not detected and  corrected.       Ursula Alert, MD 10/30/2019, 12:25 PM

## 2019-11-05 ENCOUNTER — Other Ambulatory Visit: Payer: Self-pay

## 2019-11-05 ENCOUNTER — Ambulatory Visit (INDEPENDENT_AMBULATORY_CARE_PROVIDER_SITE_OTHER): Payer: Medicare Other | Admitting: Licensed Clinical Social Worker

## 2019-11-05 ENCOUNTER — Encounter: Payer: Self-pay | Admitting: Licensed Clinical Social Worker

## 2019-11-05 DIAGNOSIS — F331 Major depressive disorder, recurrent, moderate: Secondary | ICD-10-CM | POA: Diagnosis not present

## 2019-11-05 DIAGNOSIS — F4310 Post-traumatic stress disorder, unspecified: Secondary | ICD-10-CM | POA: Diagnosis not present
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
## 2019-11-06 ENCOUNTER — Telehealth: Payer: Self-pay

## 2019-11-06 NOTE — Telephone Encounter (Signed)
Parking placard paperwork complete and ready for pickup at front desk.Patient advised.

## 2019-11-19 ENCOUNTER — Ambulatory Visit (INDEPENDENT_AMBULATORY_CARE_PROVIDER_SITE_OTHER): Payer: Medicare Other | Admitting: Licensed Clinical Social Worker

## 2019-11-19 ENCOUNTER — Other Ambulatory Visit: Payer: Self-pay

## 2019-11-19 ENCOUNTER — Encounter: Payer: Self-pay | Admitting: Licensed Clinical Social Worker

## 2019-11-19 DIAGNOSIS — F331 Major depressive disorder, recurrent, moderate: Secondary | ICD-10-CM

## 2019-11-19 DIAGNOSIS — F4310 Post-traumatic stress disorder, unspecified: Secondary | ICD-10-CM | POA: Diagnosis not present

## 2019-11-19 NOTE — Progress Notes (Signed)
Patient Location: Home  Provider Location: Home Office   Virtual Visit via Video Note  I connected with Raymond [name] on 11/19/19 at  9:00 AM EDT by a video enabled telemedicine application and verified that I am speaking with the correct person using two identifiers.   I discussed the limitations of evaluation and management by telemedicine and the availability of in person appointments. The patient expressed understanding and agreed to proceed.  THERAPY PROGRESS NOTE  Session Time: 29 Minutes  Participation Level: Active  Behavioral Response: CasualAlertDepressed  Type of Therapy: Individual Therapy  Treatment Goals addressed: Coping  Interventions: CBT  Summary: Raymond [name] is a 54 y.o. male who presents with PTSD and depression sxs. Pt reported that this week has been a little rough. Pt described a conversation he had with son about patient's trauma hx. Pt reported that it is difficult to communicate with others who have not experienced trauma and explain why he never told anyone what happened when he was younger or "did anything about it". Pt recognized through participating in previous CBT group that his reaction to the trauma is "normal" and made ties to his experiences that led him to becoming "a people pleaser". Pt identified triggers, personal insights and the struggle to break out of the people pleasing role.  Suicidal/Homicidal: No  Therapist Response: Therapist met with patient for first session since CCA. Therapist and patient reviewed treatment plan and goals. Pt in agreement. Therapist and patient discussed issues involving some family conflict and unhealthy dynamics. Therapist encouraged patient to identify thought process that supports sxs and provided feedback. Therapist provided psychoeducation around patient's diagnoses and responses to trauma and triggers. Pt was receptive.  Plan: Return again in 2 weeks.  Diagnosis: Axis I: Post Traumatic Stress  Disorder and MDD, Recurrent, Moderate    Axis II: N/A  Josephine Igo, LCSW, LCAS 11/19/2019

## 2019-11-21 ENCOUNTER — Telehealth: Payer: Self-pay

## 2019-11-21 NOTE — Telephone Encounter (Signed)
I believe he does see a mental health provider for depression and PTSD. Will you call him to see if he is doing ok and find out when next appointment is with his mental health provider.

## 2019-11-21 NOTE — Telephone Encounter (Signed)
Spoke to pt and he advised that he does see a mental health provider and he had an appt on 11/19/19 and has a next appt next week he thinks on Wed.  He stated that he was doing ok and thanked me for [place] checking in on him.  At the time of call he advised he is taking his medications and was playing video games with his nephew.  dbs

## 2019-12-02 DIAGNOSIS — E119 Type 2 diabetes mellitus without complications: Secondary | ICD-10-CM | POA: Diagnosis not present

## 2019-12-03 ENCOUNTER — Encounter: Payer: Self-pay | Admitting: Licensed Clinical Social Worker

## 2019-12-03 ENCOUNTER — Ambulatory Visit (INDEPENDENT_AMBULATORY_CARE_PROVIDER_SITE_OTHER): Payer: Medicare Other | Admitting: Licensed Clinical Social Worker

## 2019-12-03 ENCOUNTER — Other Ambulatory Visit: Payer: Self-pay

## 2019-12-03 DIAGNOSIS — F4310 Post-traumatic stress disorder, unspecified: Secondary | ICD-10-CM

## 2019-12-03 DIAGNOSIS — F331 Major depressive disorder, recurrent, moderate: Secondary | ICD-10-CM

## 2019-12-03 NOTE — Progress Notes (Signed)
Patient Location: Home  Provider Location: Home Office   Virtual Visit via Video Note  I connected with Raymond [name] on 12/03/19 at 10:00 AM EDT by a video enabled telemedicine application and verified that I am speaking with the correct person using two identifiers.   I discussed the limitations of evaluation and management by telemedicine and the availability of in person appointments. The patient expressed understanding and agreed to proceed.  THERAPY PROGRESS NOTE  Session Time: 60 Minutes  Participation Level: Active  Behavioral Response: CasualAlertDepressed  Type of Therapy: Individual Therapy  Treatment Goals addressed: Coping  Interventions: CBT  Summary: Raymond [name] is a 54 y.o. male who presents with PTSD and depression sxs. Pt reported not feeling understood by others and provided examples of situations that supported this. Pt reported chronically suicidal thoughts in "back of my mind", but at the same time fears death and denies any intent or plan to harm self. Pt reported he couldn't do that to his family. Pt reported calling suicide hotline "just to have someone to talk to". Pt discussed past regrets and current coping skills as well as wanting to find other passions and ways to engage socially with others..   Suicidal/Homicidal: Yeswithout intent/plan  Therapist Response: Therapist met with patient for follow up session. Therapist and patient processed negative thoughts and feelings. Therapist encouraged patient to engage in interests and develop a supportive network. Pt was receptive. Therapist assigned patient homework of identifying things he is willing to pursue from the pleasant activities list.  Plan: Return again in 2 weeks.  Diagnosis: Axis I: Post Traumatic Stress Disorder and MDD, Recurrent, Moderate    Axis II: N/A   P O'Reilly, LCSW, LCAS 12/03/2019  

## 2019-12-13 ENCOUNTER — Other Ambulatory Visit: Payer: Self-pay | Admitting: Adult Health

## 2019-12-13 DIAGNOSIS — E1165 Type 2 diabetes mellitus with hyperglycemia: Secondary | ICD-10-CM

## 2019-12-17 ENCOUNTER — Other Ambulatory Visit: Payer: Self-pay

## 2019-12-17 ENCOUNTER — Ambulatory Visit: Payer: Medicare Other | Admitting: Licensed Clinical Social Worker

## 2020-01-05 ENCOUNTER — Other Ambulatory Visit: Payer: Self-pay

## 2020-01-05 ENCOUNTER — Encounter: Payer: Self-pay | Admitting: Psychiatry

## 2020-01-05 ENCOUNTER — Telehealth (INDEPENDENT_AMBULATORY_CARE_PROVIDER_SITE_OTHER): Payer: Medicare Other | Admitting: Psychiatry

## 2020-01-05 DIAGNOSIS — F17200 Nicotine dependence, unspecified, uncomplicated: Secondary | ICD-10-CM | POA: Diagnosis not present

## 2020-01-05 DIAGNOSIS — F5105 Insomnia due to other mental disorder: Secondary | ICD-10-CM

## 2020-01-05 DIAGNOSIS — F3342 Major depressive disorder, recurrent, in full remission: Secondary | ICD-10-CM | POA: Diagnosis not present

## 2020-01-05 DIAGNOSIS — F4310 Post-traumatic stress disorder, unspecified: Secondary | ICD-10-CM

## 2020-01-05 MED ORDER — TEMAZEPAM 30 MG PO CAPS: 30.0000 mg | ORAL_CAPSULE | Freq: Every evening | ORAL | 3 refills | Status: DC | PRN

## 2020-01-05 MED ORDER — DULOXETINE HCL 30 MG PO CPEP: 30.0000 mg | ORAL_CAPSULE | Freq: Every day | ORAL | 1 refills | Status: DC

## 2020-01-05 MED ORDER — ESCITALOPRAM OXALATE 20 MG PO TABS: 10.0000 mg | ORAL_TABLET | Freq: Every day | ORAL | 2 refills | Status: DC

## 2020-01-05 NOTE — Progress Notes (Signed)
Provider Location : [facility] Patient Location : Home  Participants: Patient , Provider  Virtual Visit via Video Note  I connected with Raymond [name] on 01/05/20 at  2:00 PM EDT by a video enabled telemedicine application and verified that I am speaking with the correct person using two identifiers.   I discussed the limitations of evaluation and management by telemedicine and the availability of in person appointments. The patient expressed understanding and agreed to proceed.   I discussed the assessment and treatment plan with the patient. The patient was provided an opportunity to ask questions and all were answered. The patient agreed with the plan and demonstrated an understanding of the instructions.   The patient was advised to call back or seek an in-person evaluation if the symptoms worsen or if the condition fails to improve as anticipated.     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  Patient today reports he is currently struggling with sleep issues.  He reports he struggles with neuropathy pain and the pain is so severe most of the time he feels it is better if he can just cut off his legs and stop feeling this pain.  He is unable to sleep at all due to the same.  The temazepam used to help before he had this much pain.  It does not help with his sleep anymore since he is struggling with pain at this time.  He does take gabapentin and reports that does not help him much with his pain.  He continues to take Lexapro and denies side effects.  Patient reports mood wise he is okay except for his pain from the neuropathy which does  make him anxious.  Patient denies any suicidality, homicidality or perceptual disturbances.  Patient denies any other concerns today.  Visit Diagnosis:    ICD-10-CM   1. PTSD (post-traumatic stress disorder)  F43.10 escitalopram (LEXAPRO) 20 MG tablet    DULoxetine (CYMBALTA) 30 MG capsule  2. MDD (major depressive disorder), recurrent, in full remission ([place])  F33.42 escitalopram (LEXAPRO) 20 MG tablet    DULoxetine (CYMBALTA) 30 MG capsule  3. Insomnia due to mental disorder  F51.05 temazepam (RESTORIL) 30 MG capsule  4. Tobacco use disorder  F17.200     Past Psychiatric History: I have reviewed past psychiatric history from my progress note on 05/23/2018.  Past trials of Prozac, Zoloft, Celexa, Tegretol, doxepin, Klonopin, Seroquel, mirtazapine, Belsomra, Lunesta, Ativan, Rozerem  Past Medical History:  Past Medical History:  Diagnosis Date  . Anxiety   . Arthritis    NECK  . Bronchitis   . Depression   . Diabetes mellitus without complication ([place])   . 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 Dyspnea    DUE TO GALLBLADDER PER PT  . Family history of adverse reaction to anesthesia    N/V, TROUBLE BREATHING COMING OUT OF ANESTHESIA  . Fatty liver   . GERD (gastroesophageal reflux disease)   . Headache    MIGRAINES  . Hyperlipidemia   . Hypertension   . Irregular heart beat unk  . Myocardial infarction ([place]) 2010   MILD   . PTSD (post-traumatic stress disorder)   .  Sleep apnea    USES CPAP  . Tachycardia     Past Surgical History:  Procedure Laterality Date  .  ingrown toenail removal    . CHOLECYSTECTOMY N/A 01/23/2019   Procedure: LAPAROSCOPIC CHOLECYSTECTOMY;  Surgeon: Olean Ree, [name];  Location: [facility] ORS;  Service: General;  Laterality: N/A;  . CLAVICLE SURGERY Right   . COLONOSCOPY  2017  . MOUTH SURGERY     REMOVED ALL TEETH  . SHOULDER SURGERY Right   . UPPER GI ENDOSCOPY  2017    Family Psychiatric History: I have reviewed family psychiatric  history from my progress note on 05/23/2018  Family History:  Family History  Problem Relation Age of Onset  . Hypertension Mother   . Diabetes type II Mother   . Diabetes Mother   . COPD Father   . Cancer Father   . Heart disease Father   . Diabetes Sister   . Anxiety disorder Sister   . Depression Sister   . Diabetes Brother   . Anxiety disorder Brother   . Depression Brother   . Diabetes Maternal Aunt   . Diabetes Sister   . Anxiety disorder Sister   . Depression Sister   . Diabetes Sister   . Anxiety disorder Sister   . Depression Sister   . Diabetes Sister   . Anxiety disorder Sister   . Depression Sister   . Colon cancer Maternal Uncle     Social History: Reviewed social history from my progress note on 05/23/2018 Social History   Socioeconomic History  . Marital status: Divorced    Spouse name: Not on file  . Number of children: 4  . Years of education: Not on file  . Highest education level: 8th grade  Occupational History  . Not on file  Tobacco Use  . Smoking status: Current Every Day Smoker    Packs/day: 1.50    Years: 40.00    Pack years: 60.00    Types: Cigarettes  . Smokeless tobacco: Former Systems developer    Types: Chew  . Tobacco comment: Reports has tried multiple things to help quit and cut back.   Vaping Use  . Vaping Use: Former  Substance and Sexual Activity  . Alcohol use: No    Alcohol/week: 0.0 standard drinks  . Drug use: No  . Sexual activity: Not Currently  Other Topics Concern  . Not on file  Social History Narrative  . Not on file   Social Determinants of Health   Financial Resource Strain:   . Difficulty of Paying Living Expenses: Not on file  Food Insecurity:   . Worried About Charity fundraiser in the Last Year: Not on file  . Ran Out of Food in the Last Year: Not on file  Transportation Needs:   . Lack of Transportation (Medical): Not on file  . Lack of Transportation (Non-Medical): Not on file  Physical Activity:   . Days  of Exercise per Week: Not on file  . Minutes of Exercise per Session: Not on file  Stress:   . Feeling of Stress : Not on file  Social Connections:   . Frequency of Communication with Friends and Family: Not on file  . Frequency of Social Gatherings with Friends and Family: Not on file  . Attends Religious Services: Not on file  . Active Member of Clubs or Organizations: Not on file  . Attends Archivist Meetings: Not on file  . Marital Status: Not on file  Allergies:  Allergies  Allergen Reactions  . Onion Anaphylaxis  . Hydrocodone   . Norco [Hydrocodone-Acetaminophen] Itching  . Hydrocodone-Acetaminophen Itching  . Nickel Rash    Metabolic Disorder Labs: Lab Results  Component Value Date   HGBA1C 8.6 (A) 10/17/2019   MPG 294.83 05/31/2017   No results found for: PROLACTIN Lab Results  Component Value Date   CHOL 134 07/15/2015   TRIG 227 (H) 07/15/2015   HDL 28 (L) 07/15/2015   CHOLHDL 4.8 07/15/2015   VLDL 26 12/09/2012   LDLCALC 61 07/15/2015   LDLCALC 96 12/09/2012   Lab Results  Component Value Date   TSH 4.120 06/10/2018   TSH 2.000 07/15/2015    Therapeutic Level Labs: No results found for: LITHIUM No results found for: VALPROATE No components found for:  CBMZ  Current Medications: Current Outpatient Medications  Medication Sig Dispense Refill  . ACCU-CHEK FASTCLIX LANCETS MISC yse as directed twice a day 100 each 1  . albuterol (PROAIR HFA) 108 (90 Base) MCG/ACT inhaler Inhale 2-4 puffs by mouth Every 4-6 hours as needed for wheezing, cough, and/or shortness of breath 3 Inhaler 1  . aspirin EC 81 MG tablet Take 81 mg by mouth daily at 3 pm.     . atorvastatin (LIPITOR) 10 MG tablet Take 1 tablet (10 mg total) by mouth daily. 90 tablet 1  . dapagliflozin propanediol (FARXIGA) 10 MG TABS tablet Take 1 tablet (10 mg total) by mouth daily. 30 tablet 3  . DULoxetine (CYMBALTA) 30 MG capsule Take 1 capsule (30 mg total) by mouth daily. 30  capsule 1  . escitalopram (LEXAPRO) 20 MG tablet Take 0.5 tablets (10 mg total) by mouth daily. 15 tablet 2  . famotidine (PEPCID) 20 MG tablet Take 1 tablet (20 mg total) by mouth 2 (two) times daily. 60 tablet 3  . gabapentin (NEURONTIN) 100 MG capsule TAKE 1 CAPSULE BY MOUTH THREE TIMES DAILY 90 capsule 2  . glucose blood (ACCU-CHEK AVIVA PLUS) test strip Blood sugar testing TID and as needed. E11.65 100 each 12  . hydrOXYzine (ATARAX/VISTARIL) 10 MG tablet Take 1-2 tablets (10-20 mg total) by mouth at bedtime as needed. FOR SLEEP 30 tablet 2  . Insulin Glargine, 2 Unit Dial, (TOUJEO MAX SOLOSTAR) 300 UNIT/ML SOPN Inject 60 Units into the skin daily. (Patient taking differently: Inject 64 Units into the skin at bedtime. ) 3 pen 5  . insulin lispro (HUMALOG KWIKPEN) 100 UNIT/ML KwikPen Use as directed per  sliding scale max units 8-10 per meal (Patient taking differently: Inject 12-15 Units into the skin 3 (three) times daily between meals. Sliding Scale Insulin) 15 mL 2  . JANUMET 50-500 MG tablet Take 1 tablet by mouth twice daily 180 tablet 0  . metoprolol tartrate (LOPRESSOR) 100 MG tablet Take 1 tablet (100 mg total) by mouth 2 (two) times daily. 180 tablet 0  . mometasone-formoterol (DULERA) 100-5 MCG/ACT AERO Inhale 2 puffs into the lungs 2 (two) times daily. 13 g 2  . nicotine (NICODERM CQ - DOSED IN MG/24 HOURS) 14 mg/24hr patch Place 1 patch (14 mg total) onto the skin daily. 28 patch 0  . nitroGLYCERIN (NITROSTAT) 0.4 MG SL tablet Place 0.4 mg under the tongue every 5 (five) minutes x 3 doses as needed for chest pain.     . pantoprazole (PROTONIX) 40 MG tablet Take 1 tablet (40 mg total) by mouth daily. 30 tablet 5  . predniSONE (DELTASONE) 10 MG tablet Use per dose pack 21  tablet 0  . temazepam (RESTORIL) 30 MG capsule Take 1 capsule (30 mg total) by mouth at bedtime as needed for sleep. 30 capsule 3   No current facility-administered medications for this visit.      Musculoskeletal: Strength & Muscle Tone: UTA Gait & Station: normal Patient leans: N/A  Psychiatric Specialty Exam: Review of Systems  Musculoskeletal:       Pain due to neuropathy - BL Feet  Psychiatric/Behavioral: Positive for sleep disturbance. The patient is nervous/anxious.   All other systems reviewed and are negative.   There were no vitals taken for this visit.There is no height or weight on file to calculate BMI.  General Appearance: Casual  Eye Contact:  Fair  Speech:  Normal Rate  Volume:  Normal  Mood:  Anxious  Affect:  Congruent  Thought Process:  Goal Directed and Descriptions of Associations: Intact  Orientation:  Full (Time, Place, and Person)  Thought Content: Logical   Suicidal Thoughts:  No  Homicidal Thoughts:  No  Memory:  Immediate;   Fair Recent;   Fair Remote;   Fair  Judgement:  Fair  Insight:  Fair  Psychomotor Activity:  Normal  Concentration:  Concentration: Fair and Attention Span: Fair  Recall:  [employer] of Knowledge: Fair  Language: Fair  Akathisia:  No  Handed:  Right  AIMS (if indicated):UTA  Assets:  Communication Skills Desire for Improvement Housing Social Support  ADL's:  Intact  Cognition: WNL  Sleep:  Poor   Screenings: Mini-Mental     Clinical Support from 10/17/2019 in [facility], [place] from 10/08/2018 in [facility], [place] from 10/05/2017 in [facility], [facility]  Total Score (max 30 points ) 30 27 30     PHQ2-9     Office Visit from 03/03/2019 in [facility], [facility] Office Visit from 01/16/2019 in [facility], [facility] Office Visit from 11/15/2018 in [facility], [place] from 10/08/2018 in [facility], [facility] Office Visit from 07/04/2018 in [facility], [facility]  PHQ-2 Total Score 0 0 0 0 0       Assessment and Plan: FAUSTO SAMPEDRO is a 53 year old Caucasian male, lives in [place],  divorced, disabled, has a history of PTSD, MDD, insomnia, diabetes melitis, 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  Patient is biologically predisposed given his multiple health problems, history of trauma.  Patient with psychosocial stressors of several deaths in the family, his own health issues.  Patient is currently struggling with sleep issues more so because of his neuropathy pain.  Discussed plan as noted below.  Plan MDD in full remission Lexapro as prescribed  PTSD-improving We'll continue Lexapro.  Insomnia-unstable due to neuropathy pain. We'll reduce Lexapro to 10 mg p.o. daily. Start Cymbalta 30 mg p.o. daily. If he tolerates Cymbalta well, long-term plan is to taper him off of Lexapro and increase the Cymbalta since Cymbalta will also help with his neuropathy pain. Continue temazepam 30 mg p.o. nightly Hydroxyzine 10 to 20 mg p.o. nightly as needed Continue CPAP for OSA  Tobacco use disorder-unstable Provided counseling.  Follow-up in clinic in 4 weeks or sooner if needed.  I have spent atleast 20 minutes face to face with patient today. More than 50 % of the time was spent for preparing to see the patient ( e.g., review of test, records ), obtaining and to review and separately obtained history ,  ordering medications and test ,psychoeducation and supportive psychotherapy and care coordination,as well as documenting clinical information in electronic health record. This note was generated in part or whole with voice recognition software. Voice recognition is usually quite accurate but there are transcription errors that can and very often do occur. I apologize for any typographical errors that were not detected and corrected.       Ursula Alert, [name] 01/06/2020, 8:22 AM

## 2020-01-16 ENCOUNTER — Encounter: Payer: Self-pay | Admitting: Nurse Practitioner

## 2020-01-16 ENCOUNTER — Ambulatory Visit (INDEPENDENT_AMBULATORY_CARE_PROVIDER_SITE_OTHER): Payer: Medicare Other | Admitting: Nurse Practitioner

## 2020-01-16 ENCOUNTER — Other Ambulatory Visit: Payer: Self-pay

## 2020-01-16 VITALS — BP 152/91 | HR 84 | Temp 97.7°F | Resp 16 | Ht 69.0 in | Wt 221.6 lb

## 2020-01-16 DIAGNOSIS — I10 Essential (primary) hypertension: Secondary | ICD-10-CM | POA: Diagnosis not present

## 2020-01-16 DIAGNOSIS — Z9989 Dependence on other enabling machines and devices: Secondary | ICD-10-CM | POA: Diagnosis not present

## 2020-01-16 DIAGNOSIS — F4310 Post-traumatic stress disorder, unspecified: Secondary | ICD-10-CM | POA: Diagnosis not present

## 2020-01-16 DIAGNOSIS — E1165 Type 2 diabetes mellitus with hyperglycemia: Secondary | ICD-10-CM

## 2020-01-16 DIAGNOSIS — G4733 Obstructive sleep apnea (adult) (pediatric): Secondary | ICD-10-CM

## 2020-01-16 LAB — POCT GLYCOSYLATED HEMOGLOBIN (HGB A1C): Hemoglobin A1C: 8.4 % — AB (ref 4.0–5.6)

## 2020-01-16 MED ORDER — TOUJEO MAX SOLOSTAR 300 UNIT/ML ~~LOC~~ SOPN: 70.0000 [IU] | PEN_INJECTOR | Freq: Every day | SUBCUTANEOUS | 3 refills | Status: DC

## 2020-01-16 MED ORDER — METOPROLOL TARTRATE 100 MG PO TABS: 100.0000 mg | ORAL_TABLET | Freq: Two times a day (BID) | ORAL | 0 refills | Status: DC

## 2020-01-16 MED ORDER — INSULIN PEN NEEDLE 32G X 4 MM MISC: 5 refills | Status: AC

## 2020-01-16 MED ORDER — ACCU-CHEK AVIVA PLUS VI STRP: ORAL_STRIP | 12 refills | Status: DC

## 2020-01-16 NOTE — Progress Notes (Signed)
[facility] [place], [place] [address]  Internal MEDICINE  Office Visit Note  Patient Name: Raymond [name]  [id number]  [id number]  Date of Service: 02/01/2020  Chief Complaint  Patient presents with   Follow-up    cymbalta giving headches and cant sleep at night   Diabetes   Gastroesophageal Reflux   Hyperlipidemia   Hypertension   Depression    The patient is here for routine follow up visit. Blood sugars improving but still elevated. HgbA1c is 8.4 today, down from 8.6 at his most recent visit. The patient's blood pressure is mildly elevated today. He sees a mental health provider on routine basis due to PTSD and MDD. The patient is due to have routine, fasting labs. He has no new concerns or complaints today.       Current Medication: Outpatient Encounter Medications as of 01/16/2020  Medication Sig   ACCU-CHEK FASTCLIX LANCETS MISC yse as directed twice a day   albuterol (PROAIR HFA) 108 (90 Base) MCG/ACT inhaler Inhale 2-4 puffs by mouth Every 4-6 hours as needed for wheezing, cough, and/or shortness of breath   aspirin EC 81 MG tablet Take 81 mg by mouth daily at 3 pm.    atorvastatin (LIPITOR) 10 MG tablet Take 1 tablet (10 mg total) by mouth daily.   dapagliflozin propanediol (FARXIGA) 10 MG TABS tablet Take 1 tablet (10 mg total) by mouth daily.   DULoxetine (CYMBALTA) 30 MG capsule Take 1 capsule (30 mg total) by mouth daily.   escitalopram (LEXAPRO) 20 MG tablet Take 0.5 tablets (10 mg total) by mouth daily.   famotidine (PEPCID) 20 MG tablet Take 1 tablet (20 mg total) by mouth 2 (two) times daily.   gabapentin (NEURONTIN) 100 MG capsule TAKE 1 CAPSULE BY MOUTH THREE TIMES DAILY   glucose blood (ACCU-CHEK AVIVA PLUS) test strip Blood sugar testing TID and as needed. E11.65   hydrOXYzine (ATARAX/VISTARIL) 10 MG tablet Take 1-2 tablets (10-20 mg total) by mouth at bedtime as needed. FOR SLEEP   insulin glargine, 2 Unit  Dial, (TOUJEO MAX SOLOSTAR) 300 UNIT/ML Solostar Pen Inject 70 Units into the skin daily. Patient to increase dose to 70 units at bedtime   insulin lispro (HUMALOG KWIKPEN) 100 UNIT/ML KwikPen Use as directed per  sliding scale max units 8-10 per meal (Patient taking differently: Inject 12-15 Units into the skin 3 (three) times daily between meals. Sliding Scale Insulin)   JANUMET 50-500 MG tablet Take 1 tablet by mouth twice daily   metoprolol tartrate (LOPRESSOR) 100 MG tablet Take 1 tablet (100 mg total) by mouth 2 (two) times daily.   mometasone-formoterol (DULERA) 100-5 MCG/ACT AERO Inhale 2 puffs into the lungs 2 (two) times daily.   nicotine (NICODERM CQ - DOSED IN MG/24 HOURS) 14 mg/24hr patch Place 1 patch (14 mg total) onto the skin daily.   nitroGLYCERIN (NITROSTAT) 0.4 MG SL tablet Place 0.4 mg under the tongue every 5 (five) minutes x 3 doses as needed for chest pain.    pantoprazole (PROTONIX) 40 MG tablet Take 1 tablet (40 mg total) by mouth daily.   predniSONE (DELTASONE) 10 MG tablet Use per dose pack   temazepam (RESTORIL) 30 MG capsule Take 1 capsule (30 mg total) by mouth at bedtime as needed for sleep.   [DISCONTINUED] glucose blood (ACCU-CHEK AVIVA PLUS) test strip Blood sugar testing TID and as needed. E11.65   [DISCONTINUED] Insulin Glargine, 2 Unit Dial, (TOUJEO MAX SOLOSTAR) 300 UNIT/ML SOPN Inject  60 Units into the skin daily. (Patient taking differently: Inject 64 Units into the skin at bedtime. )   [DISCONTINUED] metoprolol tartrate (LOPRESSOR) 100 MG tablet Take 1 tablet (100 mg total) by mouth 2 (two) times daily.   Insulin Pen Needle 32G X 4 MM MISC Use with daily dose insulin and with sliding scale insulin as needed.   No facility-administered encounter medications on file as of 01/16/2020.    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   Dyspnea    DUE TO GALLBLADDER PER PT   Family history of adverse reaction to anesthesia    N/V, TROUBLE BREATHING COMING OUT OF ANESTHESIA   Fatty liver    GERD (gastroesophageal reflux disease)    Headache    MIGRAINES   Hyperlipidemia    Hypertension    Irregular heart beat unk   Myocardial infarction ([place]) 2010   MILD    PTSD (post-traumatic stress disorder)    Sleep apnea    USES CPAP   Tachycardia     Family History: Family History  Problem Relation Age of Onset   Hypertension Mother    Diabetes type II Mother    Diabetes Mother    COPD Father    Cancer Father    Heart disease Father    Diabetes Sister    Anxiety disorder Sister    Depression Sister    Diabetes Brother    Anxiety disorder Brother    Depression Brother    Diabetes Maternal Aunt    Diabetes Sister    Anxiety disorder Sister    Depression Sister    Diabetes Sister    Anxiety disorder Sister    Depression Sister    Diabetes Sister    Anxiety disorder Sister    Depression Sister    Colon cancer Maternal Uncle     Social History   Socioeconomic History   Marital status: Divorced    Spouse name: Not on file   Number of children: 4   Years of education: Not on file   Highest education level: 8th grade  Occupational History   Not on file  Tobacco Use   Smoking status: Current Every Day Smoker    Packs/day: 1.50    Years: 40.00    Pack years: 60.00    Types: Cigarettes    Smokeless tobacco: Former Systems developer    Types: Chew   Tobacco comment: Reports has tried multiple things to help quit and cut back.   Vaping Use   Vaping Use: Former  Substance and Sexual Activity   Alcohol use: No    Alcohol/week: 0.0 standard drinks   Drug use: No   Sexual activity: Not Currently  Other Topics Concern   Not on file  Social History Narrative   Not on file   Social Determinants of  Health   Financial Resource Strain:    Difficulty of Paying Living Expenses: Not on file  Food Insecurity:    Worried About [place] in the Last Year: Not on file   Ran Out of Food in the Last Year: Not on file  Transportation Needs:    Lack of Transportation (Medical): Not on file   Lack of Transportation (Non-Medical): Not on file  Physical Activity:    Days of Exercise per Week: Not on file   Minutes of Exercise per Session: Not on file  Stress:    Feeling of Stress : Not on file  Social Connections:    Frequency of Communication with Friends and Family: Not on file   Frequency of Social Gatherings with Friends and Family: Not on file   Attends Religious Services: Not on file   Active Member of Clubs or Organizations: Not on file   Attends Archivist Meetings: Not on file   Marital Status: Not on file  Intimate Partner Violence:    Fear of Current or Ex-Partner: Not on file   Emotionally Abused: Not on file   Physically Abused: Not on file   Sexually Abused: Not on file      Review of Systems  Constitutional: Negative for activity change, chills, fatigue and unexpected weight change.  HENT: Negative for congestion, postnasal drip, rhinorrhea, sneezing and sore throat.   Respiratory: Negative for cough, chest tightness, shortness of breath and wheezing.   Cardiovascular: Negative for chest pain and palpitations.       Elevated blood pressure today, but generally well controlled.   Gastrointestinal: Negative for abdominal pain,  constipation, diarrhea, nausea and vomiting.  Endocrine: Negative for cold intolerance, heat intolerance, polydipsia and polyuria.       Blood sugars mildly improved since his last visit.   Musculoskeletal: Negative for arthralgias, back pain, joint swelling and neck pain.  Skin: Negative for rash.  Allergic/Immunologic: Negative for environmental allergies.  Neurological: Negative for dizziness, tremors, numbness and headaches.  Hematological: Negative for adenopathy. Does not bruise/bleed easily.  Psychiatric/Behavioral: Positive for dysphoric mood. Negative for behavioral problems (Depression), sleep disturbance and suicidal ideas. The patient is nervous/anxious.        Patient sees psychiatry on routine basis    Today's Vitals   01/16/20 0957  BP: (!) 152/91  Pulse: 84  Resp: 16  Temp: 97.7 F (36.5 C)  SpO2: 97%  Weight: 221 lb 9.6 oz (100.5 kg)  Height: 5\' 9"  (1.753 m)   Body mass index is 32.72 kg/m.  Physical Exam Vitals and nursing note reviewed.  Constitutional:      General: He is not in acute distress.    Appearance: Normal appearance. He is well-developed. He is obese. He is not diaphoretic.  HENT:     Head: Normocephalic and atraumatic.     Nose: Nose normal.     Mouth/Throat:     Pharynx: No oropharyngeal exudate.  Eyes:     Pupils: Pupils are equal, round, and reactive to light.  Neck:     Thyroid: No thyromegaly.     Vascular: No carotid bruit or JVD.     Trachea: No tracheal deviation.  Cardiovascular:     Rate and Rhythm: Normal rate and regular rhythm.     Heart sounds: Normal heart sounds. No murmur heard.  No friction rub. No gallop.   Pulmonary:     Effort: Pulmonary effort is normal. No respiratory distress.  Breath sounds: Normal breath sounds. No wheezing or rales.  Chest:     Chest wall: No tenderness.  Abdominal:     Palpations: Abdomen is soft.  Musculoskeletal:        General: Normal range of motion.     Cervical back: Normal  range of motion and neck supple.  Lymphadenopathy:     Cervical: No cervical adenopathy.  Skin:    General: Skin is warm and dry.  Neurological:     General: No focal deficit present.     Mental Status: He is alert and oriented to person, place, and time.     Cranial Nerves: No cranial nerve deficit.  Psychiatric:        Mood and Affect: Mood normal.        Behavior: Behavior normal.        Thought Content: Thought content normal.        Judgment: Judgment normal.   Assessment/Plan: 1. Uncontrolled type 2 diabetes mellitus with hyperglycemia (HCC) - POCT HgB A1C 8.4 today. Increase dose of toujeo to 70units at bedtime. Use regular insulin for mealtime coverage as needed and as prescribed. Monitor closely.  - Insulin Pen Needle 32G X 4 MM MISC; Use with daily dose insulin and with sliding scale insulin as needed.  Dispense: 100 each; Refill: 5 - glucose blood (ACCU-CHEK AVIVA PLUS) test strip; Blood sugar testing TID and as needed. E11.65  Dispense: 100 each; Refill: 12 - insulin glargine, 2 Unit Dial, (TOUJEO MAX SOLOSTAR) 300 UNIT/ML Solostar Pen; Inject 70 Units into the skin daily. Patient to increase dose to 70 units at bedtime  Dispense: 10 mL; Refill: 3  2. Essential hypertension bp generally stable. Continue bp medication as prescribed.  - metoprolol tartrate (LOPRESSOR) 100 MG tablet; Take 1 tablet (100 mg total) by mouth 2 (two) times daily.  Dispense: 180 tablet; Refill: 0  3. OSA on CPAP Continue regular visits with Dr. Devona Konig for CPAP management.   4. PTSD (post-traumatic stress disorder) Continue regular visits with psychiatry as scheduled.   General Counseling: Viet verbalizes understanding of the findings of todays visit and agrees with plan of treatment. I have discussed any further diagnostic evaluation that may be needed or ordered today. We also reviewed his medications today. he has been encouraged to call the office with any questions or concerns that  should arise related to todays visit.  Diabetes Counseling:  1. Addition of ACE inh/ ARB'S for nephroprotection. Microalbumin is updated  2. Diabetic foot care, prevention of complications. Podiatry consult 3. Exercise and lose weight.  4. Diabetic eye examination, Diabetic eye exam is updated  5. Monitor blood sugar closlely. nutrition counseling.  6. Sign and symptoms of hypoglycemia including shaking sweating,confusion and headaches.  This patient was seen by [place] with Dr Lavera Guise as a part of collaborative care agreement  Orders Placed This Encounter  Procedures   POCT HgB A1C    Meds ordered this encounter  Medications   Insulin Pen Needle 32G X 4 MM MISC    Sig: Use with daily dose insulin and with sliding scale insulin as needed.    Dispense:  100 each    Refill:  5    Order Specific Question:   Supervising Provider    Answer:   Lavera Guise [[id number]]   metoprolol tartrate (LOPRESSOR) 100 MG tablet    Sig: Take 1 tablet (100 mg total) by mouth 2 (two) times daily.  Dispense:  180 tablet    Refill:  0    Increased dose    Order Specific Question:   Supervising Provider    Answer:   Lavera Guise [[id number]]   glucose blood (ACCU-CHEK AVIVA PLUS) test strip    Sig: Blood sugar testing TID and as needed. E11.65    Dispense:  100 each    Refill:  12    Order Specific Question:   Supervising Provider    Answer:   Lavera Guise [[id number]]   insulin glargine, 2 Unit Dial, (TOUJEO MAX SOLOSTAR) 300 UNIT/ML Solostar Pen    Sig: Inject 70 Units into the skin daily. Patient to increase dose to 70 units at bedtime    Dispense:  10 mL    Refill:  3    Order Specific Question:   Supervising Provider    Answer:   Lavera Guise [[id number]]    Total time spent: 30 Minutes   Time spent includes review of chart, medications, test results, and follow up plan with the patient.      Dr Lavera Guise Internal medicine

## 2020-02-01 ENCOUNTER — Encounter: Payer: Self-pay | Admitting: Nurse Practitioner

## 2020-02-06 ENCOUNTER — Encounter: Payer: Self-pay | Admitting: Psychiatry

## 2020-02-06 ENCOUNTER — Other Ambulatory Visit: Payer: Self-pay

## 2020-02-06 ENCOUNTER — Telehealth (INDEPENDENT_AMBULATORY_CARE_PROVIDER_SITE_OTHER): Payer: Medicare Other | Admitting: Psychiatry

## 2020-02-06 DIAGNOSIS — F3342 Major depressive disorder, recurrent, in full remission: Secondary | ICD-10-CM | POA: Diagnosis not present

## 2020-02-06 DIAGNOSIS — F5105 Insomnia due to other mental disorder: Secondary | ICD-10-CM | POA: Diagnosis not present

## 2020-02-06 DIAGNOSIS — F17200 Nicotine dependence, unspecified, uncomplicated: Secondary | ICD-10-CM

## 2020-02-06 DIAGNOSIS — F4310 Post-traumatic stress disorder, unspecified: Secondary | ICD-10-CM

## 2020-02-06 MED ORDER — DULOXETINE HCL 60 MG PO CPEP: 60.0000 mg | ORAL_CAPSULE | Freq: Every day | ORAL | 1 refills | Status: DC

## 2020-02-06 MED ORDER — NICOTINE POLACRILEX 4 MG MT GUM: 4.0000 mg | CHEWING_GUM | OROMUCOSAL | 0 refills | Status: DC | PRN

## 2020-02-06 NOTE — Progress Notes (Signed)
Provider Location : [facility] Patient Location : Home  Participants: Patient , Provider  Virtual Visit via Video Note  I connected with Raymond [name] on 02/06/20 at 11:40 AM EDT by a video enabled telemedicine application and verified that I am speaking with the correct person using two identifiers.   I discussed the limitations of evaluation and management by telemedicine and the availability of in person appointments. The patient expressed understanding and agreed to proceed.     I discussed the assessment and treatment plan with the patient. The patient was provided an opportunity to ask questions and all were answered. The patient agreed with the plan and demonstrated an understanding of the instructions.   The patient was advised to call back or seek an in-person evaluation if the symptoms worsen or if the condition fails to improve as anticipated.   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  Patient today reports since being on the Cymbalta he has noticed a big difference in his neuropathic pain of his feet.  He however reports when he took the Cymbalta towards the end of the day it kept him up at night.  He currently takes it during the day.  That does seem to help with sleep at night.  He continues to believe the temazepam as overall helping with his sleep.  Patient does report some anxiety symptoms on and off.  Patient denies any suicidality, homicidality or perceptual disturbances.  Patient continues to smoke cigarettes however is working on cutting back.  He is interested in treatment for the  same.  Patient denies any other concerns today.  Visit Diagnosis:    ICD-10-CM   1. MDD (major depressive disorder), recurrent, in full remission ([place])  F33.42 DULoxetine (CYMBALTA) 60 MG capsule  2. PTSD (post-traumatic stress disorder)  F43.10 DULoxetine (CYMBALTA) 60 MG capsule  3. Insomnia due to mental disorder  F51.05   4. Tobacco use disorder  F17.200 nicotine polacrilex (NICORETTE) 4 MG gum    Past Psychiatric History: I have reviewed past psychiatric history from my progress note on 05/23/2018.  Past trials of Prozac, Zoloft, Celexa, Tegretol, doxepin, Klonopin, Seroquel, mirtazapine, Belsomra, Lunesta, Ativan, Rozerem, Lexapro  Past Medical History:  Past Medical History:  Diagnosis Date  . Anxiety   . Arthritis    NECK  . Bronchitis   . Depression   . Diabetes mellitus without complication ([place])   . 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 Dyspnea    DUE TO GALLBLADDER PER PT  . Family history of adverse reaction to anesthesia    N/V, TROUBLE BREATHING COMING OUT OF ANESTHESIA  . Fatty liver   . GERD (gastroesophageal reflux disease)   . Headache    MIGRAINES  . Hyperlipidemia   . Hypertension   . Irregular heart beat unk  . Myocardial infarction ([place]) 2010   MILD   . PTSD (post-traumatic stress disorder)   . Sleep apnea    USES CPAP  . Tachycardia     Past Surgical History:  Procedure Laterality Date  .  ingrown toenail removal    . CHOLECYSTECTOMY N/A 01/23/2019   Procedure: LAPAROSCOPIC CHOLECYSTECTOMY;  Surgeon: Olean Ree, MD;  Location: [facility] ORS;  Service: General;  Laterality: N/A;  . CLAVICLE SURGERY Right   . COLONOSCOPY  2017  . MOUTH SURGERY     REMOVED ALL TEETH  . SHOULDER SURGERY Right   . UPPER GI ENDOSCOPY  2017    Family Psychiatric History: I have reviewed family psychiatric history from my progress note on 05/23/2018  Family History:  Family History  Problem Relation Age of Onset  . Hypertension Mother   . Diabetes type II  Mother   . Diabetes Mother   . COPD Father   . Cancer Father   . Heart disease Father   . Diabetes Sister   . Anxiety disorder Sister   . Depression Sister   . Diabetes Brother   . Anxiety disorder Brother   . Depression Brother   . Diabetes Maternal Aunt   . Diabetes Sister   . Anxiety disorder Sister   . Depression Sister   . Diabetes Sister   . Anxiety disorder Sister   . Depression Sister   . Diabetes Sister   . Anxiety disorder Sister   . Depression Sister   . Colon cancer Maternal Uncle     Social History: Reviewed social history from my progress note on 05/23/2018 Social History   Socioeconomic History  . Marital status: Divorced    Spouse name: Not on file  . Number of children: 4  . Years of education: Not on file  . Highest education level: 8th grade  Occupational History  . Not on file  Tobacco Use  . Smoking status: Current Every Day Smoker    Packs/day: 1.50    Years: 40.00    Pack years: 60.00    Types: Cigarettes  . Smokeless tobacco: Former Systems developer    Types: Chew  . Tobacco comment: Reports has tried multiple things to help quit and cut back.   Vaping Use  . Vaping Use: Former  Substance and Sexual Activity  . Alcohol use: No    Alcohol/week: 0.0 standard drinks  . Drug use: No  . Sexual activity: Not Currently  Other Topics Concern  . Not on file  Social History Narrative  . Not on file   Social Determinants of Health   Financial Resource Strain:   . Difficulty of Paying Living Expenses: Not on file  Food Insecurity:   . Worried About Charity fundraiser in the Last Year: Not on file  . Ran Out of Food in the Last Year: Not on file  Transportation Needs:   . Lack of Transportation (Medical): Not on file  . Lack of Transportation (Non-Medical): Not on file  Physical Activity:   . Days of Exercise per Week: Not on file  . Minutes of Exercise per Session: Not on file  Stress:   . Feeling of Stress : Not on file  Social Connections:   .  Frequency of Communication with Friends and Family: Not on file  . Frequency of Social Gatherings with Friends and Family: Not on file  . Attends Religious Services: Not on file  . Active Member of Clubs or Organizations: Not on file  . Attends Archivist Meetings: Not on file  . Marital Status: Not on file    Allergies:  Allergies  Allergen Reactions  . Onion Anaphylaxis  . Hydrocodone   . Norco [Hydrocodone-Acetaminophen] Itching  . Hydrocodone-Acetaminophen Itching  . Nickel Rash    Metabolic Disorder Labs: Lab Results  Component Value  Date   HGBA1C 8.4 (A) 01/16/2020   MPG 294.83 05/31/2017   No results found for: PROLACTIN Lab Results  Component Value Date   CHOL 134 07/15/2015   TRIG 227 (H) 07/15/2015   HDL 28 (L) 07/15/2015   CHOLHDL 4.8 07/15/2015   VLDL 26 12/09/2012   LDLCALC 61 07/15/2015   LDLCALC 96 12/09/2012   Lab Results  Component Value Date   TSH 4.120 06/10/2018   TSH 2.000 07/15/2015    Therapeutic Level Labs: No results found for: LITHIUM No results found for: VALPROATE No components found for:  CBMZ  Current Medications: Current Outpatient Medications  Medication Sig Dispense Refill  . ACCU-CHEK FASTCLIX LANCETS MISC yse as directed twice a day 100 each 1  . albuterol (PROAIR HFA) 108 (90 Base) MCG/ACT inhaler Inhale 2-4 puffs by mouth Every 4-6 hours as needed for wheezing, cough, and/or shortness of breath 3 Inhaler 1  . aspirin EC 81 MG tablet Take 81 mg by mouth daily at 3 pm.     . atorvastatin (LIPITOR) 10 MG tablet Take 1 tablet (10 mg total) by mouth daily. 90 tablet 1  . dapagliflozin propanediol (FARXIGA) 10 MG TABS tablet Take 1 tablet (10 mg total) by mouth daily. 30 tablet 3  . DULoxetine (CYMBALTA) 60 MG capsule Take 1 capsule (60 mg total) by mouth daily. 30 capsule 1  . famotidine (PEPCID) 20 MG tablet Take 1 tablet (20 mg total) by mouth 2 (two) times daily. 60 tablet 3  . gabapentin (NEURONTIN) 100 MG capsule  TAKE 1 CAPSULE BY MOUTH THREE TIMES DAILY 90 capsule 2  . glucose blood (ACCU-CHEK AVIVA PLUS) test strip Blood sugar testing TID and as needed. E11.65 100 each 12  . hydrOXYzine (ATARAX/VISTARIL) 10 MG tablet Take 1-2 tablets (10-20 mg total) by mouth at bedtime as needed. FOR SLEEP 30 tablet 2  . insulin glargine, 2 Unit Dial, (TOUJEO MAX SOLOSTAR) 300 UNIT/ML Solostar Pen Inject 70 Units into the skin daily. Patient to increase dose to 70 units at bedtime 10 mL 3  . insulin lispro (HUMALOG KWIKPEN) 100 UNIT/ML KwikPen Use as directed per  sliding scale max units 8-10 per meal (Patient taking differently: Inject 12-15 Units into the skin 3 (three) times daily between meals. Sliding Scale Insulin) 15 mL 2  . Insulin Pen Needle 32G X 4 MM MISC Use with daily dose insulin and with sliding scale insulin as needed. 100 each 5  . JANUMET 50-500 MG tablet Take 1 tablet by mouth twice daily 180 tablet 0  . metoprolol tartrate (LOPRESSOR) 100 MG tablet Take 1 tablet (100 mg total) by mouth 2 (two) times daily. 180 tablet 0  . mometasone-formoterol (DULERA) 100-5 MCG/ACT AERO Inhale 2 puffs into the lungs 2 (two) times daily. 13 g 2  . nicotine (NICODERM CQ - DOSED IN MG/24 HOURS) 14 mg/24hr patch Place 1 patch (14 mg total) onto the skin daily. 28 patch 0  . nicotine polacrilex (NICORETTE) 4 MG gum Take 1 each (4 mg total) by mouth as needed for smoking cessation. 100 tablet 0  . nitroGLYCERIN (NITROSTAT) 0.4 MG SL tablet Place 0.4 mg under the tongue every 5 (five) minutes x 3 doses as needed for chest pain.     . pantoprazole (PROTONIX) 40 MG tablet Take 1 tablet (40 mg total) by mouth daily. 30 tablet 5  . predniSONE (DELTASONE) 10 MG tablet Use per dose pack 21 tablet 0  . temazepam (RESTORIL) 30 MG capsule Take  1 capsule (30 mg total) by mouth at bedtime as needed for sleep. 30 capsule 3   No current facility-administered medications for this visit.     Musculoskeletal: Strength & Muscle Tone:  UTA Gait & Station: normal Patient leans: N/A  Psychiatric Specialty Exam: Review of Systems  Musculoskeletal:       BL feet pain - due to neuropathy - improving  Psychiatric/Behavioral: The patient is nervous/anxious.   All other systems reviewed and are negative.   There were no vitals taken for this visit.There is no height or weight on file to calculate BMI.  General Appearance: Casual  Eye Contact:  Fair  Speech:  Clear and Coherent  Volume:  Normal  Mood:  Anxious  Affect:  Congruent  Thought Process:  Goal Directed and Descriptions of Associations: Intact  Orientation:  Full (Time, Place, and Person)  Thought Content: Logical   Suicidal Thoughts:  No  Homicidal Thoughts:  No  Memory:  Immediate;   Fair Recent;   Fair Remote;   Fair  Judgement:  Fair  Insight:  Fair  Psychomotor Activity:  Normal  Concentration:  Concentration: Fair and Attention Span: Fair  Recall:  [employer] of Knowledge: Fair  Language: Fair  Akathisia:  No  Handed:  Right  AIMS (if indicated): UTA  Assets:  Communication Skills Desire for Improvement Housing Social Support  ADL's:  Intact  Cognition: WNL  Sleep:  Fair   Screenings: Mini-Mental     Clinical Support from 10/17/2019 in [facility], [place] from 10/08/2018 in [facility], [place] from 10/05/2017 in [facility], [facility]  Total Score (max 30 points ) 30 27 30     PHQ2-9     Office Visit from 01/16/2020 in [facility], [facility] Office Visit from 03/03/2019 in [facility], [facility] Office Visit from 01/16/2019 in [facility], [facility] Office Visit from 11/15/2018 in [facility], [place] from 10/08/2018 in [facility], [facility]  PHQ-2 Total Score 0 0 0 0 0       Assessment and Plan: Raymond [name] is a 54 year old Caucasian male, lives in [place], divorced, disabled, has been a history of PTSD, MDD, insomnia,  diabetes melitis, 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  Patient is biologically predisposed given his multiple health problems, history of trauma.  Patient with psychosocial stressors of several deaths in the family, his own health issues.  Patient with anxiety symptoms and is currently making progress on the Cymbalta which also helps with his neuropathy pain.  Plan as noted below.  Plan MDD in full remission Cymbalta as prescribed  PTSD-improving Will increase Cymbalta to 60 mg p.o. daily.  Advised patient to take it during the day. Discontinue Lexapro.  Insomnia-improving Continue temazepam 30 mg p.o. nightly I have reviewed  controlled substance database. Hydroxyzine 10 to 20 mg p.o. nightly as needed Continue CPAP for OSA  Tobacco use disorder-improving We will start nicotine gum. Provided information for smoking cessation classes.  He will let Probation officer know if he is interested.  Follow-up in clinic in 6 weeks or sooner if needed.  I have spent atleast 20 minutes face to face by video with patient today. More than 50 % of the time was spent for preparing to see the patient ( e.g., review of test, records ), ordering medications and test ,psychoeducation and supportive psychotherapy and care coordination,as well as documenting clinical  information in electronic health record. This note was generated in part or whole with voice recognition software. Voice recognition is usually quite accurate but there are transcription errors that can and very often do occur. I apologize for any typographical errors that were not detected and corrected.        Ursula Alert, MD 02/06/2020, 12:00 PM

## 2020-02-11 ENCOUNTER — Telehealth: Payer: Self-pay | Admitting: Psychiatry

## 2020-02-11 DIAGNOSIS — F17200 Nicotine dependence, unspecified, uncomplicated: Secondary | ICD-10-CM

## 2020-02-11 DIAGNOSIS — F3342 Major depressive disorder, recurrent, in full remission: Secondary | ICD-10-CM

## 2020-02-11 DIAGNOSIS — F4310 Post-traumatic stress disorder, unspecified: Secondary | ICD-10-CM

## 2020-02-11 MED ORDER — NICOTINE 14 MG/24HR TD PT24: 14.0000 mg | MEDICATED_PATCH | Freq: Every day | TRANSDERMAL | 0 refills | Status: DC

## 2020-02-11 MED ORDER — DULOXETINE HCL 60 MG PO CPEP: 60.0000 mg | ORAL_CAPSULE | Freq: Every day | ORAL | 1 refills | Status: DC

## 2020-02-11 NOTE — Telephone Encounter (Signed)
I have sent Cymbalta to pharmacy again due to E prescribing error previously.

## 2020-03-08 ENCOUNTER — Telehealth (INDEPENDENT_AMBULATORY_CARE_PROVIDER_SITE_OTHER): Payer: Medicare Other | Admitting: Psychiatry

## 2020-03-08 ENCOUNTER — Telehealth: Payer: Self-pay

## 2020-03-08 ENCOUNTER — Encounter: Payer: Self-pay | Admitting: Psychiatry

## 2020-03-08 ENCOUNTER — Other Ambulatory Visit: Payer: Self-pay

## 2020-03-08 DIAGNOSIS — F17200 Nicotine dependence, unspecified, uncomplicated: Secondary | ICD-10-CM | POA: Diagnosis not present

## 2020-03-08 DIAGNOSIS — F5105 Insomnia due to other mental disorder: Secondary | ICD-10-CM | POA: Diagnosis not present

## 2020-03-08 DIAGNOSIS — F3342 Major depressive disorder, recurrent, in full remission: Secondary | ICD-10-CM | POA: Diagnosis not present

## 2020-03-08 DIAGNOSIS — Z79899 Other long term (current) drug therapy: Secondary | ICD-10-CM

## 2020-03-08 DIAGNOSIS — F4310 Post-traumatic stress disorder, unspecified: Secondary | ICD-10-CM | POA: Diagnosis not present

## 2020-03-08 MED ORDER — BUPROPION HCL 75 MG PO TABS: 75.0000 mg | ORAL_TABLET | Freq: Every day | ORAL | 1 refills | Status: DC

## 2020-03-08 NOTE — Progress Notes (Signed)
Virtual Visit via Video Note  I connected with Raymond [name] on 03/08/20 at 11:40 AM EST by a video enabled telemedicine application and verified that I am speaking with the correct person using two identifiers. I discussed the limitations of evaluation and management by telemedicine and the availability of in person appointments. The patient expressed understanding and agreed to proceed. Location Provider Location : [facility] Patient Location : Home  Participants: Patient , Provider    I discussed the assessment and treatment plan with the patient. The patient was provided an opportunity to ask questions and all were answered. The patient agreed with the plan and demonstrated an understanding of the instructions. The patient was advised to call back or seek an in-person evaluation if the symptoms worsen or if the condition fails to improve as anticipated.   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  Patient today reports the Cymbalta has made a big difference with his neuropathy pain.  He reports he also believes his mood symptoms as improved however continues to struggle with lack of motivation on and off.  Patient reports sleep is good since the neuropathy pain has improved.  Patient continues to be compliant on his medications.   Patient does report difficulty with urination.  Unknown if this is a side effect of Cymbalta.  He is not interested in stopping the Cymbalta at this time.  He wants to talk to his primary care provider.  Patient reports he is interested in cutting back on smoking cigarettes.  Discussed  Wellbutrin for his lack of motivation as well as cigarette smoking.  He is willing to try it.  Patient denies any suicidality, homicidality or perceptual disturbances.    Visit Diagnosis:    ICD-10-CM   1. MDD (major depressive disorder), recurrent, in full remission ([place])  F33.42 buPROPion (WELLBUTRIN) 75 MG tablet  2. PTSD (post-traumatic stress disorder)  F43.10   3. Tobacco use disorder  F17.200   4. Insomnia due to mental disorder  F51.05   5. High risk medication use  Z79.899 CMP and Liver    Past Psychiatric History: I have reviewed past psychiatric history from my progress note on 05/23/2018.  Past trials of Prozac, Zoloft, Celexa, Tegretol, doxepin, Klonopin, Seroquel, mirtazapine, Belsomra, Lunesta, Ativan, Rozerem, Lexapro  Past Medical History:  Past Medical History:  Diagnosis Date  . Anxiety   . Arthritis    NECK  . Bronchitis   . Depression   . Diabetes mellitus without complication ([place])   . 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 Dyspnea    DUE TO GALLBLADDER PER PT  . Family history of adverse reaction to anesthesia    N/V, TROUBLE BREATHING COMING OUT OF ANESTHESIA  . Fatty liver   . GERD (gastroesophageal reflux disease)   . Headache    MIGRAINES  . Hyperlipidemia   . Hypertension   . Irregular heart beat unk  . Myocardial infarction ([place]) 2010   MILD   . PTSD (post-traumatic stress disorder)   . Sleep apnea    USES CPAP  . Tachycardia     Past Surgical History:  Procedure Laterality Date  .  ingrown toenail removal    . CHOLECYSTECTOMY N/A 01/23/2019   Procedure: LAPAROSCOPIC CHOLECYSTECTOMY;  Surgeon: Olean Ree, MD;  Location: [facility] ORS;  Service: General;  Laterality: N/A;  . CLAVICLE SURGERY Right   . COLONOSCOPY  2017  . MOUTH SURGERY     REMOVED ALL TEETH  . SHOULDER SURGERY Right   . UPPER GI ENDOSCOPY  2017    Family Psychiatric History: I have reviewed family psychiatric history from my progress note on 05/23/2018  Family  History:  Family History  Problem Relation Age of Onset  . Hypertension Mother   . Diabetes type II Mother   . Diabetes Mother   . COPD Father   . Cancer Father   . Heart disease Father   . Diabetes Sister   . Anxiety disorder Sister   . Depression Sister   . Diabetes Brother   . Anxiety disorder Brother   . Depression Brother   . Diabetes Maternal Aunt   . Diabetes Sister   . Anxiety disorder Sister   . Depression Sister   . Diabetes Sister   . Anxiety disorder Sister   . Depression Sister   . Diabetes Sister   . Anxiety disorder Sister   . Depression Sister   . Colon cancer Maternal Uncle     Social History: Reviewed social history from my progress note on 05/23/2018 Social History   Socioeconomic History  . Marital status: Divorced    Spouse name: Not on file  . Number of children: 4  . Years of education: Not on file  . Highest education level: 8th grade  Occupational History  . Not on file  Tobacco Use  . Smoking status: Current Every Day Smoker    Packs/day: 1.50    Years: 40.00    Pack years: 60.00    Types: Cigarettes  . Smokeless tobacco: Former Systems developer    Types: Chew  . Tobacco comment: Reports has tried multiple things to help quit and cut back.   Vaping Use  . Vaping Use: Former  Substance and Sexual Activity  . Alcohol use: No    Alcohol/week: 0.0 standard drinks  . Drug use: No  . Sexual activity: Not Currently  Other Topics Concern  . Not on file  Social History Narrative  . Not on file   Social Determinants of Health   Financial Resource Strain:   . Difficulty of Paying Living Expenses: Not on file  Food Insecurity:   . Worried About Charity fundraiser in the Last Year: Not on file  . Ran Out of Food in the Last Year: Not on file  Transportation Needs:   . Lack of Transportation (Medical): Not on file  . Lack of Transportation (Non-Medical): Not on file  Physical Activity:   . Days of Exercise per Week: Not on file  . Minutes of  Exercise per Session: Not on file  Stress:   . Feeling of Stress : Not on file  Social Connections:   . Frequency of Communication with Friends and Family: Not on file  . Frequency of Social Gatherings with Friends and Family: Not on file  . Attends Religious Services: Not on file  . Active Member of Clubs or Organizations: Not on file  . Attends Archivist Meetings: Not on file  . Marital Status: Not on file    Allergies:  Allergies  Allergen Reactions  . Onion Anaphylaxis  . Hydrocodone   . Norco [Hydrocodone-Acetaminophen] Itching  . Hydrocodone-Acetaminophen  Itching  . Nickel Rash    Metabolic Disorder Labs: Lab Results  Component Value Date   HGBA1C 8.4 (A) 01/16/2020   MPG 294.83 05/31/2017   No results found for: PROLACTIN Lab Results  Component Value Date   CHOL 134 07/15/2015   TRIG 227 (H) 07/15/2015   HDL 28 (L) 07/15/2015   CHOLHDL 4.8 07/15/2015   VLDL 26 12/09/2012   LDLCALC 61 07/15/2015   LDLCALC 96 12/09/2012   Lab Results  Component Value Date   TSH 4.120 06/10/2018   TSH 2.000 07/15/2015    Therapeutic Level Labs: No results found for: LITHIUM No results found for: VALPROATE No components found for:  CBMZ  Current Medications: Current Outpatient Medications  Medication Sig Dispense Refill  . ACCU-CHEK FASTCLIX LANCETS MISC yse as directed twice a day 100 each 1  . albuterol (PROAIR HFA) 108 (90 Base) MCG/ACT inhaler Inhale 2-4 puffs by mouth Every 4-6 hours as needed for wheezing, cough, and/or shortness of breath 3 Inhaler 1  . aspirin EC 81 MG tablet Take 81 mg by mouth daily at 3 pm.     . atorvastatin (LIPITOR) 10 MG tablet Take 1 tablet (10 mg total) by mouth daily. 90 tablet 1  . buPROPion (WELLBUTRIN) 75 MG tablet Take 1 tablet (75 mg total) by mouth daily. 30 tablet 1  . dapagliflozin propanediol (FARXIGA) 10 MG TABS tablet Take 1 tablet (10 mg total) by mouth daily. 30 tablet 3  . DULoxetine (CYMBALTA) 60 MG capsule Take  1 capsule (60 mg total) by mouth daily. 30 capsule 1  . famotidine (PEPCID) 20 MG tablet Take 1 tablet (20 mg total) by mouth 2 (two) times daily. 60 tablet 3  . gabapentin (NEURONTIN) 100 MG capsule TAKE 1 CAPSULE BY MOUTH THREE TIMES DAILY 90 capsule 2  . glucose blood (ACCU-CHEK AVIVA PLUS) test strip Blood sugar testing TID and as needed. E11.65 100 each 12  . hydrOXYzine (ATARAX/VISTARIL) 10 MG tablet Take 1-2 tablets (10-20 mg total) by mouth at bedtime as needed. FOR SLEEP 30 tablet 2  . insulin glargine, 2 Unit Dial, (TOUJEO MAX SOLOSTAR) 300 UNIT/ML Solostar Pen Inject 70 Units into the skin daily. Patient to increase dose to 70 units at bedtime 10 mL 3  . insulin lispro (HUMALOG KWIKPEN) 100 UNIT/ML KwikPen Use as directed per  sliding scale max units 8-10 per meal (Patient taking differently: Inject 12-15 Units into the skin 3 (three) times daily between meals. Sliding Scale Insulin) 15 mL 2  . Insulin Pen Needle 32G X 4 MM MISC Use with daily dose insulin and with sliding scale insulin as needed. 100 each 5  . JANUMET 50-500 MG tablet Take 1 tablet by mouth twice daily 180 tablet 0  . metoprolol tartrate (LOPRESSOR) 100 MG tablet Take 1 tablet (100 mg total) by mouth 2 (two) times daily. 180 tablet 0  . mometasone-formoterol (DULERA) 100-5 MCG/ACT AERO Inhale 2 puffs into the lungs 2 (two) times daily. 13 g 2  . nicotine (NICODERM CQ - DOSED IN MG/24 HOURS) 14 mg/24hr patch Place 1 patch (14 mg total) onto the skin daily. 28 patch 0  . nicotine polacrilex (NICORETTE) 4 MG gum Take 1 each (4 mg total) by mouth as needed for smoking cessation. 100 tablet 0  . nitroGLYCERIN (NITROSTAT) 0.4 MG SL tablet Place 0.4 mg under the tongue every 5 (five) minutes x 3 doses as needed for chest pain.     . pantoprazole (PROTONIX) 40 MG  tablet Take 1 tablet (40 mg total) by mouth daily. 30 tablet 5  . predniSONE (DELTASONE) 10 MG tablet Use per dose pack 21 tablet 0  . temazepam (RESTORIL) 30 MG capsule  Take 1 capsule (30 mg total) by mouth at bedtime as needed for sleep. 30 capsule 3   No current facility-administered medications for this visit.     Musculoskeletal: Strength & Muscle Tone: UTA Gait & Station: normal Patient leans: N/A  Psychiatric Specialty Exam: Review of Systems  Genitourinary: Positive for difficulty urinating and dysuria.  Neurological:       Neuropathic pain of feet  Psychiatric/Behavioral:       Lack of motivation  All other systems reviewed and are negative.   There were no vitals taken for this visit.There is no height or weight on file to calculate BMI.  General Appearance: Casual  Eye Contact:  Fair  Speech:  Clear and Coherent  Volume:  Normal  Mood:  Lack of motivation, otherwise reports mood as stable  Affect:  Congruent  Thought Process:  Goal Directed and Descriptions of Associations: Intact  Orientation:  Full (Time, Place, and Person)  Thought Content: Logical   Suicidal Thoughts:  No  Homicidal Thoughts:  No  Memory:  Immediate;   Fair Recent;   Fair Remote;   Fair  Judgement:  Fair  Insight:  Fair  Psychomotor Activity:  Normal  Concentration:  Concentration: Fair and Attention Span: Fair  Recall:  [employer] of Knowledge: Fair  Language: Fair  Akathisia:  No  Handed:  Right  AIMS (if indicated): UTA  Assets:  Communication Skills Desire for Improvement Housing Social Support  ADL's:  Intact  Cognition: WNL  Sleep:  improving   Screenings: Mini-Mental     Clinical Support from 10/17/2019 in [facility], [place] from 10/08/2018 in [facility], [place] from 10/05/2017 in [facility], [facility]  Total Score (max 30 points ) 30 27 30     PHQ2-9     Office Visit from 01/16/2020 in [facility], [facility] Office Visit from 03/03/2019 in [facility], [facility] Office Visit from 01[facility]16/2019 in [facility], [facility] Office Visit from 11[facility]15/2018 in [facility], [place] from 10/08/2018 in [facility], [facility]  PHQ-2 Total Score 0 0 0 0 0       Assessment and Plan: Raymond [name] is a 54 year old Caucasian male, lives in [place] divorced, disabled, has a history of PTSD, MDD, insomnia, diabetes melitis, 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  Patient is biologically predisposed given his multiple health problems, history of trauma.  Patient with psychosocial stressors of several deaths in the family, his own health issues.  Patient is currently struggling with lack of motivation as well as urinary difficulty.  He will benefit from the following plan.  Plan MDD in remission Cymbalta as prescribed  PTSD-improving Cymbalta 60 mg p.o. daily. Start Wellbutrin 75 mg p.o. daily in the morning.   Insomnia-improving Cymbalta does help with neuropathy pain which has helped him to sleep better. Temazepam 30 mg p.o. nightly Hydroxyzine 20 mg p.o. nightly as needed Continue CPAP for OSA  Tobacco use disorder-improving Start Wellbutrin as discussed above.  High risk medication use-will order CMP, liver function since he is on Cymbalta.  Patient advised to follow-up with primary care provider about difficulty with urination.  Discussed to discontinue Cymbalta however he is not  agreeable.  Follow-up in clinic in 3 weeks or sooner if needed.  I have spent atleast 20 minutes face to face by video with patient today. More than 50 % of the time was spent for preparing to see the patient ( e.g., review of test, records ), ordering medications and test ,psychoeducation and supportive psychotherapy and care coordination,as well as documenting clinical information in electronic health record. This note was generated in part or whole with voice recognition software. Voice recognition is usually quite accurate but there are transcription errors that can and very often do  occur. I apologize for any typographical errors that were not detected and corrected.        Raymond Alert, MD 03/08/2020, 1:02 PM

## 2020-03-08 NOTE — Telephone Encounter (Signed)
faxed and confirmed cmp and liver order dx z79.899

## 2020-03-09 ENCOUNTER — Other Ambulatory Visit
Admission: RE | Admit: 2020-03-09 | Discharge: 2020-03-09 | Disposition: A | Discharge status: Home / Self Care | Payer: Medicare Other | Attending: Psychiatry | Admitting: Psychiatry

## 2020-03-09 DIAGNOSIS — Z79899 Other long term (current) drug therapy: Secondary | ICD-10-CM | POA: Insufficient documentation

## 2020-03-09 LAB — COMPREHENSIVE METABOLIC PANEL
ALT: 26 U/L (ref 0–44)
AST: 28 U/L (ref 15–41)
Albumin: 4.1 g/dL (ref 3.5–5.0)
Alkaline Phosphatase: 109 U/L (ref 38–126)
Anion gap: 14 (ref 5–15)
BUN: 14 mg/dL (ref 6–20)
CO2: 21 mmol/L — ABNORMAL LOW (ref 22–32)
Calcium: 9.5 mg/dL (ref 8.9–10.3)
Chloride: 98 mmol/L (ref 98–111)
Creatinine, Ser: 1.05 mg/dL (ref 0.61–1.24)
GFR, Estimated: >60 mL/min (ref >60)
Glucose, Bld: 291 mg/dL — ABNORMAL HIGH (ref 70–99)
Potassium: 4.1 mmol/L (ref 3.5–5.1)
Sodium: 133 mmol/L — ABNORMAL LOW (ref 135–145)
Total Bilirubin: 0.6 mg/dL (ref 0.3–1.2)
Total Protein: 8.5 g/dL — ABNORMAL HIGH (ref 6.5–8.1)

## 2020-03-09 LAB — BILIRUBIN, DIRECT: Bilirubin, Direct: <0.1 mg/dL (ref 0.0–0.2)

## 2020-03-11 ENCOUNTER — Other Ambulatory Visit: Payer: Self-pay

## 2020-03-11 MED ORDER — ATORVASTATIN CALCIUM 10 MG PO TABS: 10.0000 mg | ORAL_TABLET | Freq: Every day | ORAL | 1 refills | Status: DC

## 2020-04-07 ENCOUNTER — Telehealth (INDEPENDENT_AMBULATORY_CARE_PROVIDER_SITE_OTHER): Payer: Medicare Other | Admitting: Psychiatry

## 2020-04-07 ENCOUNTER — Encounter: Payer: Self-pay | Admitting: Psychiatry

## 2020-04-07 ENCOUNTER — Other Ambulatory Visit: Payer: Self-pay

## 2020-04-07 DIAGNOSIS — F17200 Nicotine dependence, unspecified, uncomplicated: Secondary | ICD-10-CM

## 2020-04-07 DIAGNOSIS — F4310 Post-traumatic stress disorder, unspecified: Secondary | ICD-10-CM

## 2020-04-07 DIAGNOSIS — F5105 Insomnia due to other mental disorder: Secondary | ICD-10-CM | POA: Diagnosis not present

## 2020-04-07 DIAGNOSIS — Z79899 Other long term (current) drug therapy: Secondary | ICD-10-CM

## 2020-04-07 DIAGNOSIS — F3342 Major depressive disorder, recurrent, in full remission: Secondary | ICD-10-CM | POA: Diagnosis not present

## 2020-04-07 MED ORDER — TEMAZEPAM 30 MG PO CAPS: 30.0000 mg | ORAL_CAPSULE | Freq: Every evening | ORAL | 3 refills | Status: DC | PRN

## 2020-04-07 MED ORDER — HYDROXYZINE HCL 10 MG PO TABS: 10.0000 mg | ORAL_TABLET | Freq: Every evening | ORAL | 2 refills | Status: DC | PRN

## 2020-04-07 MED ORDER — DULOXETINE HCL 60 MG PO CPEP: 60.0000 mg | ORAL_CAPSULE | Freq: Every day | ORAL | 1 refills | Status: DC

## 2020-04-07 NOTE — Progress Notes (Signed)
Virtual Visit via Video Note  I connected with Raymond [name] on 04/07/20 at 11:00 AM EST by a video enabled telemedicine application and verified that I am speaking with the correct person using two identifiers.  Location Provider Location : [facility] Patient Location : Home  Participants: Patient , Provider   I discussed the limitations of evaluation and management by telemedicine and the availability of in person appointments. The patient expressed understanding and agreed to proceed.   I discussed the assessment and treatment plan with the patient. The patient was provided an opportunity to ask questions and all were answered. The patient agreed with the plan and demonstrated an understanding of the instructions.   The patient was advised to call back or seek an in-person evaluation if the symptoms worsen or if the condition fails to improve as anticipated.    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  Patient today reports he is currently compliant on the Wellbutrin which was recently started.  He denies any side effects.  He reports he may have noticed some improvement in his energy level however he is aware that he has to give the medication more time since he just started taking it.  He is not interested in a dosage increase yet and wants to get this dose more time.  He reports the Wellbutrin has also helped him to reduce the amount of cigarettes he smokes.  He reports he has been able to cut back to 1 pack/day.  That is an improvement for him.  Patient reports he continues to take Cymbalta which has helped him  tremendously with his neuropathy pain.  Patient denies any suicidality, homicidality or perceptual disturbances.  He does report increased frequency of urination, this does affect his sleep at night.  He is planning to talk to his primary care provider about the same.  Patient denies any other concerns today.  Visit Diagnosis:    ICD-10-CM   1. MDD (major depressive disorder), recurrent, in full remission ([place])  F33.42 DULoxetine (CYMBALTA) 60 MG capsule  2. PTSD (post-traumatic stress disorder)  F43.10 DULoxetine (CYMBALTA) 60 MG capsule  3. Tobacco use disorder  F17.200   4. Insomnia due to mental disorder  F51.05 temazepam (RESTORIL) 30 MG capsule    hydrOXYzine (ATARAX/VISTARIL) 10 MG tablet  5. High risk medication use  Z79.899     Past Psychiatric History: I have reviewed past psychiatric history from my progress note on 05/23/2018.  Past trials of Prozac, Zoloft, Celexa, Tegretol, doxepin, Klonopin, Seroquel, mirtazapine, Belsomra, Lunesta, Ativan, Rozerem, Lexapro  Past Medical History:  Past Medical History:  Diagnosis Date  . Anxiety   . Arthritis    NECK  . Bronchitis   . Depression   . Diabetes mellitus without complication ([place])   . 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 Dyspnea    DUE TO GALLBLADDER PER PT  . Family history of adverse reaction to anesthesia    N/V, TROUBLE BREATHING COMING OUT OF ANESTHESIA  . Fatty liver   . GERD (gastroesophageal reflux disease)   . Headache    MIGRAINES  . Hyperlipidemia   . Hypertension   .  Irregular heart beat unk  . Myocardial infarction ([place]) 2010   MILD   . PTSD (post-traumatic stress disorder)   . Sleep apnea    USES CPAP  . Tachycardia     Past Surgical History:  Procedure Laterality Date  .  ingrown toenail removal    . CHOLECYSTECTOMY N/A 01/23/2019   Procedure: LAPAROSCOPIC CHOLECYSTECTOMY;  Surgeon: Olean Ree, MD;  Location: [facility] ORS;  Service: General;  Laterality: N/A;  . CLAVICLE SURGERY Right    . COLONOSCOPY  2017  . MOUTH SURGERY     REMOVED ALL TEETH  . SHOULDER SURGERY Right   . UPPER GI ENDOSCOPY  2017    Family Psychiatric History: I have reviewed family psychiatric history from my progress note on 05/23/2018  Family History:  Family History  Problem Relation Age of Onset  . Hypertension Mother   . Diabetes type II Mother   . Diabetes Mother   . COPD Father   . Cancer Father   . Heart disease Father   . Diabetes Sister   . Anxiety disorder Sister   . Depression Sister   . Diabetes Brother   . Anxiety disorder Brother   . Depression Brother   . Diabetes Maternal Aunt   . Diabetes Sister   . Anxiety disorder Sister   . Depression Sister   . Diabetes Sister   . Anxiety disorder Sister   . Depression Sister   . Diabetes Sister   . Anxiety disorder Sister   . Depression Sister   . Colon cancer Maternal Uncle     Social History: Reviewed social history from my progress note on 05/23/2018 Social History   Socioeconomic History  . Marital status: Divorced    Spouse name: Not on file  . Number of children: 4  . Years of education: Not on file  . Highest education level: 8th grade  Occupational History  . Not on file  Tobacco Use  . Smoking status: Current Every Day Smoker    Packs/day: 1.00    Years: 40.00    Pack years: 40.00    Types: Cigarettes  . Smokeless tobacco: Former Systems developer    Types: Chew  . Tobacco comment: 1 pack per day reported 04/07/2020  Vaping Use  . Vaping Use: Former  Substance and Sexual Activity  . Alcohol use: No    Alcohol/week: 0.0 standard drinks  . Drug use: No  . Sexual activity: Not Currently  Other Topics Concern  . Not on file  Social History Narrative  . Not on file   Social Determinants of Health   Financial Resource Strain: Not on file  Food Insecurity: Not on file  Transportation Needs: Not on file  Physical Activity: Not on file  Stress: Not on file  Social Connections: Not on file    Allergies:   Allergies  Allergen Reactions  . Onion Anaphylaxis  . Hydrocodone   . Norco [Hydrocodone-Acetaminophen] Itching  . Hydrocodone-Acetaminophen Itching  . Nickel Rash    Metabolic Disorder Labs: Lab Results  Component Value Date   HGBA1C 8.4 (A) 01/16/2020   MPG 294.83 05/31/2017   No results found for: PROLACTIN Lab Results  Component Value Date   CHOL 134 07/15/2015   TRIG 227 (H) 07/15/2015   HDL 28 (L) 07/15/2015   CHOLHDL 4.8 07/15/2015   VLDL 26 12/09/2012   LDLCALC 61 07/15/2015   LDLCALC 96 12/09/2012   Lab Results  Component Value Date   TSH 4.120 06/10/2018  TSH 2.000 07/15/2015    Therapeutic Level Labs: No results found for: LITHIUM No results found for: VALPROATE No components found for:  CBMZ  Current Medications: Current Outpatient Medications  Medication Sig Dispense Refill  . buPROPion (WELLBUTRIN) 75 MG tablet Take 1 tablet (75 mg total) by mouth daily. 30 tablet 1  . ACCU-CHEK FASTCLIX LANCETS MISC yse as directed twice a day 100 each 1  . albuterol (PROAIR HFA) 108 (90 Base) MCG/ACT inhaler Inhale 2-4 puffs by mouth Every 4-6 hours as needed for wheezing, cough, and/or shortness of breath 3 Inhaler 1  . aspirin EC 81 MG tablet Take 81 mg by mouth daily at 3 pm.     . atorvastatin (LIPITOR) 10 MG tablet Take 1 tablet (10 mg total) by mouth daily. 90 tablet 1  . dapagliflozin propanediol (FARXIGA) 10 MG TABS tablet Take 1 tablet (10 mg total) by mouth daily. 30 tablet 3  . DULoxetine (CYMBALTA) 60 MG capsule Take 1 capsule (60 mg total) by mouth daily. 30 capsule 1  . famotidine (PEPCID) 20 MG tablet Take 1 tablet (20 mg total) by mouth 2 (two) times daily. 60 tablet 3  . glucose blood (ACCU-CHEK AVIVA PLUS) test strip Blood sugar testing TID and as needed. E11.65 100 each 12  . hydrOXYzine (ATARAX/VISTARIL) 10 MG tablet Take 1-2 tablets (10-20 mg total) by mouth at bedtime as needed. FOR SLEEP 30 tablet 2  . insulin glargine, 2 Unit Dial, (TOUJEO  MAX SOLOSTAR) 300 UNIT/ML Solostar Pen Inject 70 Units into the skin daily. Patient to increase dose to 70 units at bedtime 10 mL 3  . insulin lispro (HUMALOG KWIKPEN) 100 UNIT/ML KwikPen Use as directed per  sliding scale max units 8-10 per meal (Patient taking differently: Inject 12-15 Units into the skin 3 (three) times daily between meals. Sliding Scale Insulin) 15 mL 2  . Insulin Pen Needle 32G X 4 MM MISC Use with daily dose insulin and with sliding scale insulin as needed. 100 each 5  . JANUMET 50-500 MG tablet Take 1 tablet by mouth twice daily 180 tablet 0  . metoprolol tartrate (LOPRESSOR) 100 MG tablet Take 1 tablet (100 mg total) by mouth 2 (two) times daily. 180 tablet 0  . mometasone-formoterol (DULERA) 100-5 MCG/ACT AERO Inhale 2 puffs into the lungs 2 (two) times daily. 13 g 2  . nicotine (NICODERM CQ - DOSED IN MG/24 HOURS) 14 mg/24hr patch Place 1 patch (14 mg total) onto the skin daily. 28 patch 0  . nicotine polacrilex (NICORETTE) 4 MG gum Take 1 each (4 mg total) by mouth as needed for smoking cessation. 100 tablet 0  . nitroGLYCERIN (NITROSTAT) 0.4 MG SL tablet Place 0.4 mg under the tongue every 5 (five) minutes x 3 doses as needed for chest pain.     . pantoprazole (PROTONIX) 40 MG tablet Take 1 tablet (40 mg total) by mouth daily. 30 tablet 5  . predniSONE (DELTASONE) 10 MG tablet Use per dose pack 21 tablet 0  . temazepam (RESTORIL) 30 MG capsule Take 1 capsule (30 mg total) by mouth at bedtime as needed for sleep. 30 capsule 3   No current facility-administered medications for this visit.     Musculoskeletal: Strength & Muscle Tone: UTA Gait & Station: normal Patient leans: N/A  Psychiatric Specialty Exam: Review of Systems  Genitourinary: Positive for frequency.  Musculoskeletal:       Foot pain - improving  Psychiatric/Behavioral: Positive for sleep disturbance. Negative for suicidal ideas.  All other systems reviewed and are negative.   There were no vitals  taken for this visit.There is no height or weight on file to calculate BMI.  General Appearance: Casual  Eye Contact:  Fair  Speech:  Clear and Coherent  Volume:  Normal  Mood:  Denies sadness, struggles with lack of motivation  Affect:  Congruent  Thought Process:  Goal Directed and Descriptions of Associations: Intact  Orientation:  Full (Time, Place, and Person)  Thought Content: Logical   Suicidal Thoughts:  No  Homicidal Thoughts:  No  Memory:  Immediate;   Fair Recent;   Fair Remote;   Fair  Judgement:  Fair  Insight:  Fair  Psychomotor Activity:  Normal  Concentration:  Concentration: Fair and Attention Span: Fair  Recall:  [employer] of Knowledge: Fair  Language: Fair  Akathisia:  No  Handed:  Right  AIMS (if indicated): UTA  Assets:  Communication Skills Desire for Improvement Housing Social Support  ADL's:  Intact  Cognition: WNL  Sleep:  Restless due to frequent urination at night   Screenings: [place] [facility] 10/17/[facility] in [facility] [place] from 10/08/2018 in [facility], [place] from 10/05/2017 in [facility], [facility]  Total Score (max 30 points ) 30 27 30     PHQ2-9   [place] Visit from 01/16/2020 in [facility], [facility] Office Visit from 03/03/2019 in [facility], [facility] Office Visit from 01/16/2019 in [facility], [facility] Office Visit from 11/15/2018 in [facility], [place] from 10/08/2018 in [facility], [facility]  PHQ-2 Total Score 0 0 0 0 0       Assessment and Plan: MARTEZ WEIAND is a 54 year old Caucasian male, lives in [place], divorced, disabled, has a history of PTSD, MDD, insomnia, diabetes melitis, 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  Patient is biologically predisposed given his multiple health problems, history of  trauma.  Patient with psychosocial stressors of several deaths in the family, his own health issues.  Patient continues to struggle with lack of motivation.  Plan as noted below.  Plan MDD-unstable Cymbalta as prescribed Wellbutrin 75 mg p.o. daily in the morning- wants to give it more time.  Insomnia-restless due to frequent urination Continue temazepam 30 mg p.o. nightly Continue CPAP for OSA Hydroxyzine 10 to 20 mg at bedtime as needed. Patient to have PMD visit for frequent urination.  PTSD-improving Cymbalta as prescribed at 60 mg p.o. daily.  Tobacco use disorder-improving Wellbutrin 75 mg p.o. daily in the morning  High risk medication use-I have reviewed the following labs-CMP dated 03/09/2020-AST/ALT-slightly elevated however chronic.  Patient to follow-up with his primary care provider.  Follow-up in clinic in 1 month or sooner if needed.  I have spent atleast 20 minutes face to face by video with patient today. More than 50 % of the time was spent for preparing to see the patient ( e.g., review of test, records ), ordering medications and test ,psychoeducation and supportive psychotherapy and care coordination,as well as documenting clinical information in electronic health record,interpreting and communication of test results This note was generated in part or whole with voice recognition software. Voice recognition is usually quite accurate but there are transcription errors that can and very often do occur. I apologize for any typographical errors that were not detected and corrected.  Ursula Alert, MD 04/07/2020, 5:34 PM

## 2020-04-08 ENCOUNTER — Other Ambulatory Visit: Payer: Self-pay | Admitting: Adult Health

## 2020-04-08 DIAGNOSIS — K219 Gastro-esophageal reflux disease without esophagitis: Secondary | ICD-10-CM

## 2020-04-09 ENCOUNTER — Other Ambulatory Visit: Payer: Self-pay

## 2020-04-09 DIAGNOSIS — E1165 Type 2 diabetes mellitus with hyperglycemia: Secondary | ICD-10-CM

## 2020-04-09 MED ORDER — PANTOPRAZOLE SODIUM 40 MG PO TBEC: 40.0000 mg | DELAYED_RELEASE_TABLET | Freq: Every day | ORAL | 5 refills | Status: DC

## 2020-04-09 MED ORDER — DAPAGLIFLOZIN PROPANEDIOL 10 MG PO TABS: 10.0000 mg | ORAL_TABLET | Freq: Every day | ORAL | 0 refills | Status: DC

## 2020-04-12 ENCOUNTER — Encounter: Payer: Self-pay | Admitting: Nurse Practitioner

## 2020-04-12 ENCOUNTER — Ambulatory Visit (INDEPENDENT_AMBULATORY_CARE_PROVIDER_SITE_OTHER): Payer: Medicare Other | Admitting: Nurse Practitioner

## 2020-04-12 ENCOUNTER — Other Ambulatory Visit: Payer: Self-pay

## 2020-04-12 VITALS — BP 136/82 | HR 100 | Temp 97.4°F | Resp 16 | Ht 69.0 in | Wt 224.4 lb

## 2020-04-12 DIAGNOSIS — I209 Angina pectoris, unspecified: Secondary | ICD-10-CM

## 2020-04-12 DIAGNOSIS — I10 Essential (primary) hypertension: Secondary | ICD-10-CM

## 2020-04-12 DIAGNOSIS — Z9119 Patient's noncompliance with other medical treatment and regimen: Secondary | ICD-10-CM | POA: Diagnosis not present

## 2020-04-12 DIAGNOSIS — K219 Gastro-esophageal reflux disease without esophagitis: Secondary | ICD-10-CM | POA: Diagnosis not present

## 2020-04-12 DIAGNOSIS — Z91199 Patient's noncompliance with other medical treatment and regimen due to unspecified reason: Secondary | ICD-10-CM

## 2020-04-12 DIAGNOSIS — E1165 Type 2 diabetes mellitus with hyperglycemia: Secondary | ICD-10-CM

## 2020-04-12 DIAGNOSIS — F4310 Post-traumatic stress disorder, unspecified: Secondary | ICD-10-CM

## 2020-04-12 LAB — POCT GLYCOSYLATED HEMOGLOBIN (HGB A1C): Hemoglobin A1C: 11.1 % — AB (ref 4.0–5.6)

## 2020-04-12 MED ORDER — NITROGLYCERIN 0.4 MG SL SUBL: 0.4000 mg | SUBLINGUAL_TABLET | SUBLINGUAL | 1 refills | Status: DC | PRN

## 2020-04-12 MED ORDER — JANUMET 50-500 MG PO TABS: 1.0000 | ORAL_TABLET | Freq: Two times a day (BID) | ORAL | 1 refills | Status: DC

## 2020-04-12 MED ORDER — FAMOTIDINE 20 MG PO TABS: 20.0000 mg | ORAL_TABLET | Freq: Two times a day (BID) | ORAL | 1 refills | Status: DC

## 2020-04-12 MED ORDER — METOPROLOL TARTRATE 100 MG PO TABS: 100.0000 mg | ORAL_TABLET | Freq: Two times a day (BID) | ORAL | 1 refills | Status: DC

## 2020-04-12 MED ORDER — PANTOPRAZOLE SODIUM 40 MG PO TBEC: 40.0000 mg | DELAYED_RELEASE_TABLET | Freq: Every day | ORAL | 1 refills | Status: DC

## 2020-04-12 NOTE — Progress Notes (Signed)
[facility] [place],  [address]  Internal MEDICINE  Office Visit Note  Patient Name: Raymond [name]  [id number]  [id number]  Date of Service: 04/12/2020  Chief Complaint  Patient presents with  . Follow-up    Difficultly urinating for a month, frequency and difficult to get started, feet hurt still, toes feel like something is wrapped around them really tight and difficult to bend  . Hypertension  . Hyperlipidemia  . Gastroesophageal Reflux  . Depression  . Diabetes  . Sleep Apnea  . Anxiety    The patient is here for routine follow up. -blood sugars running very elevated. Admits he has not been taking any of his daytime medications for diabetes. He states that he has been taking the toujeo 70units at night and also the farxiga (though this has been out for about a week). Has not taken Janumet or sliding scale for mealtime coverage in a few months. He admits to having increasd thirst and increased need to urinate.  -therapist has added duloxetine to help with depression/mood/nervie issues. She has also added wellbutrin during the day to help with mood and smoking cessation.  -having some urinary hesitancy which started when these medications were started.  -blood pressure well controlled -does get short of breath with exertion, but otherwise, breathing well managed.       Current Medication: Outpatient Encounter Medications as of 04/12/2020  Medication Sig  . ACCU-CHEK FASTCLIX LANCETS MISC yse as directed twice a day  . albuterol (PROAIR HFA) 108 (90 Base) MCG/ACT inhaler Inhale 2-4 puffs by mouth Every 4-6 hours as needed for wheezing, cough, and/or shortness of breath  . aspirin EC 81 MG tablet Take 81 mg by mouth daily at 3 pm.   . atorvastatin (LIPITOR) 10 MG tablet Take 1 tablet (10 mg total) by mouth daily.  Marland Kitchen buPROPion (WELLBUTRIN) 75 MG tablet Take 1 tablet (75 mg total) by mouth daily.  . dapagliflozin propanediol (FARXIGA) 10 MG  TABS tablet Take 1 tablet (10 mg total) by mouth daily.  . DULoxetine (CYMBALTA) 60 MG capsule Take 1 capsule (60 mg total) by mouth daily.  Marland Kitchen glucose blood (ACCU-CHEK AVIVA PLUS) test strip Blood sugar testing TID and as needed. E11.65  . hydrOXYzine (ATARAX/VISTARIL) 10 MG tablet Take 1-2 tablets (10-20 mg total) by mouth at bedtime as needed. FOR SLEEP  . insulin glargine, 2 Unit Dial, (TOUJEO MAX SOLOSTAR) 300 UNIT/ML Solostar Pen Inject 70 Units into the skin daily. Patient to increase dose to 70 units at bedtime  . insulin lispro (HUMALOG KWIKPEN) 100 UNIT/ML KwikPen Use as directed per  sliding scale max units 8-10 per meal (Patient taking differently: Inject 12-15 Units into the skin 3 (three) times daily between meals. Sliding Scale Insulin)  . Insulin Pen Needle 32G X 4 MM MISC Use with daily dose insulin and with sliding scale insulin as needed.  . mometasone-formoterol (DULERA) 100-5 MCG/ACT AERO Inhale 2 puffs into the lungs 2 (two) times daily.  . nicotine (NICODERM CQ - DOSED IN MG/24 HOURS) 14 mg/24hr patch Place 1 patch (14 mg total) onto the skin daily.  . nicotine polacrilex (NICORETTE) 4 MG gum Take 1 each (4 mg total) by mouth as needed for smoking cessation.  . predniSONE (DELTASONE) 10 MG tablet Use per dose pack  . temazepam (RESTORIL) 30 MG capsule Take 1 capsule (30 mg total) by mouth at bedtime as needed for sleep.  . [DISCONTINUED] famotidine (PEPCID) 20 MG tablet Take  1 tablet (20 mg total) by mouth 2 (two) times daily.  . [DISCONTINUED] JANUMET 50-500 MG tablet Take 1 tablet by mouth twice daily  . [DISCONTINUED] metoprolol tartrate (LOPRESSOR) 100 MG tablet Take 1 tablet (100 mg total) by mouth 2 (two) times daily.  . [DISCONTINUED] nitroGLYCERIN (NITROSTAT) 0.4 MG SL tablet Place 0.4 mg under the tongue every 5 (five) minutes x 3 doses as needed for chest pain.   . [DISCONTINUED] pantoprazole (PROTONIX) 40 MG tablet Take 1 tablet (40 mg total) by mouth daily.  .  famotidine (PEPCID) 20 MG tablet Take 1 tablet (20 mg total) by mouth 2 (two) times daily.  . metoprolol tartrate (LOPRESSOR) 100 MG tablet Take 1 tablet (100 mg total) by mouth 2 (two) times daily.  . nitroGLYCERIN (NITROSTAT) 0.4 MG SL tablet Place 1 tablet (0.4 mg total) under the tongue every 5 (five) minutes x 3 doses as needed for chest pain.  . pantoprazole (PROTONIX) 40 MG tablet Take 1 tablet (40 mg total) by mouth daily.  . sitaGLIPtin-metformin (JANUMET) 50-500 MG tablet Take 1 tablet by mouth 2 (two) times daily.   No facility-administered encounter medications on file as of 04/12/2020.    Surgical History: Past Surgical History:  Procedure Laterality Date  .  ingrown toenail removal    . CHOLECYSTECTOMY N/A 01/23/2019   Procedure: LAPAROSCOPIC CHOLECYSTECTOMY;  Surgeon: Olean Ree, MD;  Location: [facility] ORS;  Service: General;  Laterality: N/A;  . CLAVICLE SURGERY Right   . COLONOSCOPY  2017  . MOUTH SURGERY     REMOVED ALL TEETH  . SHOULDER SURGERY Right   . UPPER GI ENDOSCOPY  2017    Medical History: Past Medical History:  Diagnosis Date  . Anxiety   . Arthritis    NECK  . Bronchitis   . Depression   . Diabetes mellitus without complication ([place])   . 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 Dyspnea    DUE TO GALLBLADDER PER PT  . Family history of adverse reaction to anesthesia    N/V, TROUBLE BREATHING COMING OUT OF ANESTHESIA  . Fatty liver   . GERD (gastroesophageal reflux disease)   . Headache    MIGRAINES  . Hyperlipidemia   . Hypertension   . Irregular heart beat unk  . Myocardial infarction ([place]) 2010   MILD   . PTSD (post-traumatic stress disorder)   . Sleep apnea    USES CPAP  . Tachycardia     Family History: Family History  Problem Relation Age of Onset  . Hypertension Mother   . Diabetes type II Mother   . Diabetes Mother   . COPD Father   . Cancer Father   . Heart disease Father   . Diabetes Sister   . Anxiety disorder  Sister   . Depression Sister   . Diabetes Brother   . Anxiety disorder Brother   . Depression Brother   . Diabetes Maternal Aunt   . Diabetes Sister   . Anxiety disorder Sister   . Depression Sister   . Diabetes Sister   . Anxiety disorder Sister   . Depression Sister   . Diabetes Sister   . Anxiety disorder Sister   . Depression Sister   . Colon cancer Maternal Uncle     Social History   Socioeconomic History  . Marital status: Divorced    Spouse name: Not on file  . Number of children: 4  . Years of education: Not on file  . Highest  education level: 8th grade  Occupational History  . Not on file  Tobacco Use  . Smoking status: Current Every Day Smoker    Packs/day: 1.00    Years: 40.00    Pack years: 40.00    Types: Cigarettes  . Smokeless tobacco: Former Systems developer    Types: Chew  . Tobacco comment: 1 pack per day reported 04/07/2020  Vaping Use  . Vaping Use: Former  Substance and Sexual Activity  . Alcohol use: No    Alcohol/week: 0.0 standard drinks  . Drug use: No  . Sexual activity: Not Currently  Other Topics Concern  . Not on file  Social History Narrative  . Not on file   Social Determinants of Health   Financial Resource Strain: Not on file  Food Insecurity: Not on file  Transportation Needs: Not on file  Physical Activity: Not on file  Stress: Not on file  Social Connections: Not on file  Intimate Partner Violence: Not on file      Review of Systems  Constitutional: Negative for activity change, chills, fatigue and unexpected weight change.  HENT: Negative for congestion, postnasal drip, rhinorrhea, sneezing and sore throat.   Respiratory: Positive for shortness of breath. Negative for cough and chest tightness.        Mostly with exertion.   Cardiovascular: Negative for chest pain and palpitations.       Blood pressure well managed.   Gastrointestinal: Negative for abdominal pain, constipation, diarrhea, nausea and vomiting.  Endocrine:  Positive for polydipsia and polyuria. Negative for cold intolerance and heat intolerance.       Blood sugars uncontrolled. Patient admits not taking medication as prescribed. Not following proper diet.  Genitourinary: Positive for frequency.  Musculoskeletal: Negative for arthralgias, back pain, joint swelling and neck pain.  Skin: Negative for rash.  Allergic/Immunologic: Positive for environmental allergies.  Neurological: Negative for dizziness, tremors, numbness and headaches.  Hematological: Negative for adenopathy. Does not bruise/bleed easily.  Psychiatric/Behavioral: Positive for dysphoric mood. Negative for behavioral problems (Depression), sleep disturbance and suicidal ideas. The patient is nervous/anxious.        Patient seeing psychiatry on routine basis. New medications added.    Today's Vitals   04/12/20 1049  BP: 136/82  Pulse: 100  Resp: 16  Temp: (!) 97.4 F (36.3 C)  SpO2: 97%  Weight: 224 lb 6.4 oz (101.8 kg)  Height: 5\' 9"  (1.753 m)   Body mass index is 33.14 kg/m.  Physical Exam Vitals and nursing note reviewed.  Constitutional:      General: He is not in acute distress.    Appearance: Normal appearance. He is well-developed and well-nourished. He is obese. He is not diaphoretic.  HENT:     Head: Normocephalic and atraumatic.     Mouth/Throat:     Mouth: Oropharynx is clear and moist.     Pharynx: No oropharyngeal exudate.  Eyes:     Extraocular Movements: EOM normal.     Pupils: Pupils are equal, round, and reactive to light.  Neck:     Thyroid: No thyromegaly.     Vascular: No carotid bruit or JVD.     Trachea: No tracheal deviation.  Cardiovascular:     Rate and Rhythm: Normal rate and regular rhythm.     Heart sounds: Normal heart sounds. No murmur heard. No friction rub. No gallop.   Pulmonary:     Effort: Pulmonary effort is normal. No respiratory distress.     Breath sounds: Normal breath  sounds. No wheezing or rales.  Chest:     Chest  wall: No tenderness.  Abdominal:     General: Bowel sounds are normal.     Palpations: Abdomen is soft.     Tenderness: There is no abdominal tenderness.  Musculoskeletal:        General: Normal range of motion.     Cervical back: Normal range of motion and neck supple.  Lymphadenopathy:     Cervical: No cervical adenopathy.  Skin:    General: Skin is warm and dry.  Neurological:     General: No focal deficit present.     Mental Status: He is alert and oriented to person, place, and time.     Cranial Nerves: No cranial nerve deficit.  Psychiatric:        Mood and Affect: Mood and affect and mood normal.        Behavior: Behavior normal.        Thought Content: Thought content normal.        Judgment: Judgment normal.    Assessment/Plan: 1. Uncontrolled type 2 diabetes mellitus with hyperglycemia (HCC) - POCT HgB A1C 11.1 today. Patient not taking medications as prescribed. States that he has continued to take toujeo at night along with farxiga, which was just refilled. Has not taken Janumet or sliding scale mealtime coverage in some time. Refilled Janumet today. Advised him to start checking blood sugars prior to each meal and dosing regular insulin as indicated per sliding scale.  - sitaGLIPtin-metformin (JANUMET) 50-500 MG tablet; Take 1 tablet by mouth 2 (two) times daily.  Dispense: 180 tablet; Refill: 1  2. Noncompliance with diabetes treatment Patient not taking medications as prescribed. States that he has continued to take toujeo at night along with farxiga, which was just refilled. Has not taken Janumet or sliding scale mealtime coverage in some time. Refilled Janumet today. Advised him to start checking blood sugars prior to each meal and dosing regular insulin as indicated per sliding scale.  Discussed importance of taking all medications as prescribed and risks associated with continuing to let blood sugars go uncontrolled. He voiced understanding and states that he is going  to do much better with this    3. Gastroesophageal reflux disease without esophagitis Patient should continue prescribed protonix daily and pepcid 20mg  twice daily as needed. New prescriptions were sent for both medications today.  - famotidine (PEPCID) 20 MG tablet; Take 1 tablet (20 mg total) by mouth 2 (two) times daily.  Dispense: 180 tablet; Refill: 1 - pantoprazole (PROTONIX) 40 MG tablet; Take 1 tablet (40 mg total) by mouth daily.  Dispense: 90 tablet; Refill: 1  4. Essential hypertension Stable. Continue all medications as prescribed  - metoprolol tartrate (LOPRESSOR) 100 MG tablet; Take 1 tablet (100 mg total) by mouth 2 (two) times daily.  Dispense: 180 tablet; Refill: 1  5. Angina pectoris [facility]) May use sublingual nitroglycerin as needed and as prescribed. Has not needed in over a year, however, current prescription is long expired. Refill provided today.  - nitroGLYCERIN (NITROSTAT) 0.4 MG SL tablet; Place 1 tablet (0.4 mg total) under the tongue every 5 (five) minutes x 3 doses as needed for chest pain.  Dispense: 30 tablet; Refill: 1  6. PTSD (post-traumatic stress disorder) Patient should continue regular visits with psychiatry as scheduled.   General Counseling: Curtez verbalizes understanding of the findings of todays visit and agrees with plan of treatment. I have discussed any further diagnostic evaluation that may be  needed or ordered today. We also reviewed his medications today. he has been encouraged to call the office with any questions or concerns that should arise related to todays visit.  Diabetes Counseling:  1. Addition of ACE inh/ ARB'S for nephroprotection. Microalbumin is updated  2. Diabetic foot care, prevention of complications. Podiatry consult 3. Exercise and lose weight.  4. Diabetic eye examination, Diabetic eye exam is updated  5. Monitor blood sugar closlely. nutrition counseling.  6. Sign and symptoms of hypoglycemia including shaking  sweating,confusion and headaches.  Counseling: Adherence of Medical Therapy: The patient understands that it is the responsibility of the patient to complete all prescribed medications, all recommended testing, including but not limited to, laboratory studies and imaging. The patient further understands the need to keep all scheduled follow-up visits and to inform the office immediately of any changes in their medical condition. The patient understands that the success of treatment in large part depends on the patient's willingness to complete the therapeutic regimen and to work in partnership with the designated health-care providers.   This patient was seen by Leretha Pol FNP Collaboration with Dr Lavera Guise as a part of collaborative care agreement  Orders Placed This Encounter  Procedures  . POCT HgB A1C    Meds ordered this encounter  Medications  . famotidine (PEPCID) 20 MG tablet    Sig: Take 1 tablet (20 mg total) by mouth 2 (two) times daily.    Dispense:  180 tablet    Refill:  1    To replace rx for ranitidine    Order Specific Question:   Supervising Provider    Answer:   Lavera Guise [[id number]]  . sitaGLIPtin-metformin (JANUMET) 50-500 MG tablet    Sig: Take 1 tablet by mouth 2 (two) times daily.    Dispense:  180 tablet    Refill:  1    Order Specific Question:   Supervising Provider    Answer:   Lavera Guise [[id number]]  . metoprolol tartrate (LOPRESSOR) 100 MG tablet    Sig: Take 1 tablet (100 mg total) by mouth 2 (two) times daily.    Dispense:  180 tablet    Refill:  1    Increased dose    Order Specific Question:   Supervising Provider    Answer:   Lavera Guise [[id number]]  . pantoprazole (PROTONIX) 40 MG tablet    Sig: Take 1 tablet (40 mg total) by mouth daily.    Dispense:  90 tablet    Refill:  1    Order Specific Question:   Supervising Provider    Answer:   Lavera Guise [[id number]]  . nitroGLYCERIN (NITROSTAT) 0.4 MG SL tablet    Sig: Place 1 tablet (0.4 mg  total) under the tongue every 5 (five) minutes x 3 doses as needed for chest pain.    Dispense:  30 tablet    Refill:  1    Order Specific Question:   Supervising Provider    Answer:   Lavera Guise [[id number]]    Total time spent: 30 Minutes   Time spent includes review of chart, medications, test results, and follow up plan with the patient.      Dr Lavera Guise Internal medicine

## 2020-05-06 ENCOUNTER — Other Ambulatory Visit: Payer: Self-pay | Admitting: Psychiatry

## 2020-05-06 DIAGNOSIS — F5105 Insomnia due to other mental disorder: Secondary | ICD-10-CM

## 2020-05-07 ENCOUNTER — Telehealth: Payer: Medicare Other | Admitting: Psychiatry

## 2020-05-27 ENCOUNTER — Telehealth (INDEPENDENT_AMBULATORY_CARE_PROVIDER_SITE_OTHER): Payer: Medicare Other | Admitting: Psychiatry

## 2020-05-27 ENCOUNTER — Encounter: Payer: Self-pay | Admitting: Psychiatry

## 2020-05-27 ENCOUNTER — Other Ambulatory Visit: Payer: Self-pay

## 2020-05-27 DIAGNOSIS — F4310 Post-traumatic stress disorder, unspecified: Secondary | ICD-10-CM

## 2020-05-27 DIAGNOSIS — F17200 Nicotine dependence, unspecified, uncomplicated: Secondary | ICD-10-CM

## 2020-05-27 DIAGNOSIS — F5105 Insomnia due to other mental disorder: Secondary | ICD-10-CM | POA: Diagnosis not present

## 2020-05-27 DIAGNOSIS — F330 Major depressive disorder, recurrent, mild: Secondary | ICD-10-CM | POA: Diagnosis not present

## 2020-05-27 MED ORDER — DULOXETINE HCL 60 MG PO CPEP
60.0000 mg | ORAL_CAPSULE | Freq: Every day | ORAL | 1 refills | Status: DC
Start: 2020-05-27 — End: 2020-08-16

## 2020-05-27 MED ORDER — BUPROPION HCL ER (SR) 150 MG PO TB12: 150.0000 mg | ORAL_TABLET | Freq: Every day | ORAL | 1 refills | Status: DC

## 2020-05-27 NOTE — Progress Notes (Unsigned)
Virtual Visit via Video Note  I connected with Raymond [name] on 05/27/20 at  3:45 PM EST by a video enabled telemedicine application and verified that I am speaking with the correct person using two identifiers.  Location Provider Location : [facility] Patient Location : Home  Participants: Patient , Provider   I discussed the limitations of evaluation and management by telemedicine and the availability of in person appointments. The patient expressed understanding and agreed to proceed.   I discussed the assessment and treatment plan with the patient. The patient was provided an opportunity to ask questions and all were answered. The patient agreed with the plan and demonstrated an understanding of the instructions.   The patient was advised to call back or seek an in-person evaluation if the symptoms worsen or if the condition fails to improve as anticipated.    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  Patient today reports he is currently struggling with severe neuropathic pain of his bilateral feet.  Patient reports the Cymbalta does help with the burning sensation that he has however he currently has severe cramps which is getting worse.  Patient reports this does make him more anxious and also affect his sleep.  He was sleeping good on the temazepam however with cramps he has been unable to sleep through the night.  He has reached out to his providers for management of his pain.  Patient does report sadness due to his current health issues.  However overall his current combination of medications are beneficial for his  depressive symptoms and he is making progress.  He denies any suicidality, homicidality or perceptual disturbances.  He reports he continues to smoke cigarettes.  He cannot use nicotine patches since he is allergic to it, he cannot use gum since he does not like the taste.  He is interested in increasing the Wellbutrin dosage.  Patient denies any other concerns today.  Visit Diagnosis:    ICD-10-CM   1. MDD (major depressive disorder), recurrent episode, mild (HCC)  F33.0 buPROPion (WELLBUTRIN SR) 150 MG 12 hr tablet    DULoxetine (CYMBALTA) 60 MG capsule   improving  2. PTSD (post-traumatic stress disorder)  F43.10 DULoxetine (CYMBALTA) 60 MG capsule  3. Tobacco use disorder  F17.200 buPROPion (WELLBUTRIN SR) 150 MG 12 hr tablet  4. Insomnia due to mental disorder  F51.05     Past Psychiatric History: I have reviewed past psychiatric history from my progress note on 05/23/2018.  Past trials of Prozac, Zoloft, Celexa, Tegretol, doxepin, Klonopin, Seroquel, mirtazapine, Belsomra, Lunesta, Ativan, Rozerem, Lexapro  Past Medical History:  Past Medical History:  Diagnosis Date  . Anxiety   . Arthritis    NECK  . Bronchitis   . Depression   . Diabetes mellitus without complication ([place])   . 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 Dyspnea    DUE TO GALLBLADDER PER PT  . Family history of adverse reaction to anesthesia    N/V, TROUBLE BREATHING COMING OUT OF ANESTHESIA  . Fatty liver   . GERD (gastroesophageal reflux disease)   . Headache    MIGRAINES  . Hyperlipidemia   .  Hypertension   . Irregular heart beat unk  . Myocardial infarction ([place]) 2010   MILD   . PTSD (post-traumatic stress disorder)   . Sleep apnea    USES CPAP  . Tachycardia     Past Surgical History:  Procedure Laterality Date  .  ingrown toenail removal    . CHOLECYSTECTOMY N/A 01/23/2019   Procedure: LAPAROSCOPIC CHOLECYSTECTOMY;  Surgeon: Olean Ree, MD;  Location: [facility] ORS;  Service: General;   Laterality: N/A;  . CLAVICLE SURGERY Right   . COLONOSCOPY  2017  . MOUTH SURGERY     REMOVED ALL TEETH  . SHOULDER SURGERY Right   . UPPER GI ENDOSCOPY  2017    Family Psychiatric History: I have reviewed family psychiatric history from my progress note on 05/23/2018  Family History:  Family History  Problem Relation Age of Onset  . Hypertension Mother   . Diabetes type II Mother   . Diabetes Mother   . COPD Father   . Cancer Father   . Heart disease Father   . Diabetes Sister   . Anxiety disorder Sister   . Depression Sister   . Diabetes Brother   . Anxiety disorder Brother   . Depression Brother   . Diabetes Maternal Aunt   . Diabetes Sister   . Anxiety disorder Sister   . Depression Sister   . Diabetes Sister   . Anxiety disorder Sister   . Depression Sister   . Diabetes Sister   . Anxiety disorder Sister   . Depression Sister   . Colon cancer Maternal Uncle     Social History: I have reviewed social history from my progress note on 05/23/2018 Social History   Socioeconomic History  . Marital status: Divorced    Spouse name: Not on file  . Number of children: 4  . Years of education: Not on file  . Highest education level: 8th grade  Occupational History  . Not on file  Tobacco Use  . Smoking status: Current Every Day Smoker    Packs/day: 1.00    Years: 40.00    Pack years: 40.00    Types: Cigarettes  . Smokeless tobacco: Former Systems developer    Types: Chew  . Tobacco comment: 1 pack per day reported 04/07/2020  Vaping Use  . Vaping Use: Former  Substance and Sexual Activity  . Alcohol use: No    Alcohol/week: 0.0 standard drinks  . Drug use: No  . Sexual activity: Not Currently  Other Topics Concern  . Not on file  Social History Narrative  . Not on file   Social Determinants of Health   Financial Resource Strain: Not on file  Food Insecurity: Not on file  Transportation Needs: Not on file  Physical Activity: Not on file  Stress: Not on file   Social Connections: Not on file    Allergies:  Allergies  Allergen Reactions  . Onion Anaphylaxis  . Hydrocodone   . Norco [Hydrocodone-Acetaminophen] Itching  . Hydrocodone-Acetaminophen Itching  . Nickel Rash    Metabolic Disorder Labs: Lab Results  Component Value Date   HGBA1C 11.1 (A) 04/12/2020   MPG 294.83 05/31/2017   No results found for: PROLACTIN Lab Results  Component Value Date   CHOL 134 07/15/2015   TRIG 227 (H) 07/15/2015   HDL 28 (L) 07/15/2015   CHOLHDL 4.8 07/15/2015   VLDL 26 12/09/2012   LDLCALC 61 07/15/2015   LDLCALC 96 12/09/2012   Lab Results  Component Value Date  TSH 4.120 06/10/2018   TSH 2.000 07/15/2015    Therapeutic Level Labs: No results found for: LITHIUM No results found for: VALPROATE No components found for:  CBMZ  Current Medications: Current Outpatient Medications  Medication Sig Dispense Refill  . buPROPion (WELLBUTRIN SR) 150 MG 12 hr tablet Take 1 tablet (150 mg total) by mouth daily. 30 tablet 1  . ACCU-CHEK FASTCLIX LANCETS MISC yse as directed twice a day 100 each 1  . albuterol (PROAIR HFA) 108 (90 Base) MCG/ACT inhaler Inhale 2-4 puffs by mouth Every 4-6 hours as needed for wheezing, cough, and/or shortness of breath 3 Inhaler 1  . aspirin EC 81 MG tablet Take 81 mg by mouth daily at 3 pm.     . atorvastatin (LIPITOR) 10 MG tablet Take 1 tablet (10 mg total) by mouth daily. 90 tablet 1  . dapagliflozin propanediol (FARXIGA) 10 MG TABS tablet Take 1 tablet (10 mg total) by mouth daily. 30 tablet 0  . DULoxetine (CYMBALTA) 60 MG capsule Take 1 capsule (60 mg total) by mouth daily. 30 capsule 1  . famotidine (PEPCID) 20 MG tablet Take 1 tablet (20 mg total) by mouth 2 (two) times daily. 180 tablet 1  . glucose blood (ACCU-CHEK AVIVA PLUS) test strip Blood sugar testing TID and as needed. E11.65 100 each 12  . hydrOXYzine (ATARAX/VISTARIL) 10 MG tablet Take 1-2 tablets (10-20 mg total) by mouth at bedtime as needed.  FOR SLEEP 30 tablet 2  . insulin glargine, 2 Unit Dial, (TOUJEO MAX SOLOSTAR) 300 UNIT/ML Solostar Pen Inject 70 Units into the skin daily. Patient to increase dose to 70 units at bedtime 10 mL 3  . insulin lispro (HUMALOG KWIKPEN) 100 UNIT/ML KwikPen Use as directed per  sliding scale max units 8-10 per meal (Patient taking differently: Inject 12-15 Units into the skin 3 (three) times daily between meals. Sliding Scale Insulin) 15 mL 2  . Insulin Pen Needle 32G X 4 MM MISC Use with daily dose insulin and with sliding scale insulin as needed. 100 each 5  . metoprolol tartrate (LOPRESSOR) 100 MG tablet Take 1 tablet (100 mg total) by mouth 2 (two) times daily. 180 tablet 1  . mometasone-formoterol (DULERA) 100-5 MCG/ACT AERO Inhale 2 puffs into the lungs 2 (two) times daily. 13 g 2  . nicotine polacrilex (NICORETTE) 4 MG gum Take 1 each (4 mg total) by mouth as needed for smoking cessation. 100 tablet 0  . nitroGLYCERIN (NITROSTAT) 0.4 MG SL tablet Place 1 tablet (0.4 mg total) under the tongue every 5 (five) minutes x 3 doses as needed for chest pain. 30 tablet 1  . pantoprazole (PROTONIX) 40 MG tablet Take 1 tablet (40 mg total) by mouth daily. 90 tablet 1  . predniSONE (DELTASONE) 10 MG tablet Use per dose pack 21 tablet 0  . sitaGLIPtin-metformin (JANUMET) 50-500 MG tablet Take 1 tablet by mouth 2 (two) times daily. 180 tablet 1  . temazepam (RESTORIL) 30 MG capsule TAKE 1 CAPSULE BY MOUTH AT BEDTIME AS NEEDED FOR SLEEP 30 capsule 2   No current facility-administered medications for this visit.     Musculoskeletal: Strength & Muscle Tone: UTA Gait & Station: UTA Patient leans: N/A  Psychiatric Specialty Exam: Review of Systems  Musculoskeletal:       Foot pain and muscle cramps - likely due to neuropathy  Psychiatric/Behavioral: Positive for sleep disturbance. The patient is nervous/anxious.   All other systems reviewed and are negative.   There were no  vitals taken for this  visit.There is no height or weight on file to calculate BMI.  General Appearance: Casual  Eye Contact:  Fair  Speech:  Clear and Coherent  Volume:  Normal  Mood:  Anxious  Affect:  Congruent  Thought Process:  Goal Directed and Descriptions of Associations: Intact  Orientation:  Full (Time, Place, and Person)  Thought Content: Logical   Suicidal Thoughts:  No  Homicidal Thoughts:  No  Memory:  Immediate;   Fair Recent;   Fair Remote;   Fair  Judgement:  Fair  Insight:  Fair  Psychomotor Activity:  Normal  Concentration:  Concentration: Fair and Attention Span: Fair  Recall:  [employer] of Knowledge: Fair  Language: Fair  Akathisia:  No  Handed:  Right  AIMS (if indicated): UTA  Assets:  Communication Skills Desire for Improvement Housing Social Support  ADL's:  Intact  Cognition: WNL  Sleep:  Restless   Screenings: Mini-Mental   Flowsheet Row Clinical Support from 10/17/2019 in [facility], [place] from 10/08/2018 in [facility], [place] from 10/05/2017 in [facility], [facility]  Total Score (max 30 points ) 30 27 30     PHQ2-9   [place] Visit from 04/12/2020 in [facility], [facility] Office Visit from 01/16/2020 in [facility], [facility] Office Visit from 03/03/2019 in [facility], [facility] Office Visit from 01/16/2019 in [facility], [facility] Office Visit from 11/15/2018 in [facility], [facility]  PHQ-2 Total Score 0 0 0 0 0       Assessment and Plan: Raymond [name] is a 55 year old Caucasian male, lives in [place], divorced, disabled, has a history of PTSD, MDD, insomnia, diabetes melitis, 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  Patient is biologically predisposed given his multiple health problems, history of trauma.  Patient with psychosocial stressors of several deaths in the family, his own health  issues.  Patient is currently struggling with pain likely due to neuropathy with muscle cramps which does have an impact on his sleep.  Patient is also interested in smoking cessation.  Plan as noted below.  Plan MDD-improving Cymbalta as prescribed Increase Wellbutrin SR 150 mg p.o. daily in the morning. Will not give twice a day dosage at this time since patient does have sleep problems and also history of severe anxiety symptoms.  Insomnia-unstable due to pain Continue temazepam 30 mg p.o. nightly Continue CPAP for OSA Hydroxyzine 10 to 20 mg at bedtime as needed Patient will benefit from sufficient pain management.  PTSD-improving Cymbalta as prescribed  Tobacco use disorder-unstable Increase Wellbutrin SR 150 mg p.o. daily in the morning. We will reevaluate patient and will consider increasing dosage, twice a day dosage in the future.  Follow-up in clinic in 4 weeks or sooner if needed.  I have spent atleast 20 minutes face to face by video with patient today. More than 50 % of the time was spent for preparing to see the patient ( e.g., review of test, records ), obtaining and to review and separately obtained history , ordering medications and test ,psychoeducation and supportive psychotherapy and care coordination,as well as documenting clinical information in electronic health record. This note was generated in part or whole with voice recognition software. Voice recognition is usually quite accurate but there are transcription errors that can and very often do occur. I apologize for any typographical errors that were not detected and  corrected.        Ursula Alert, MD 05/28/2020, 8:32 AM

## 2020-06-23 ENCOUNTER — Telehealth (INDEPENDENT_AMBULATORY_CARE_PROVIDER_SITE_OTHER): Payer: Medicare Other | Admitting: Psychiatry

## 2020-06-23 ENCOUNTER — Encounter: Payer: Self-pay | Admitting: Psychiatry

## 2020-06-23 ENCOUNTER — Other Ambulatory Visit: Payer: Self-pay

## 2020-06-23 DIAGNOSIS — F330 Major depressive disorder, recurrent, mild: Secondary | ICD-10-CM | POA: Insufficient documentation

## 2020-06-23 DIAGNOSIS — F3342 Major depressive disorder, recurrent, in full remission: Secondary | ICD-10-CM | POA: Diagnosis not present

## 2020-06-23 DIAGNOSIS — F4310 Post-traumatic stress disorder, unspecified: Secondary | ICD-10-CM

## 2020-06-23 DIAGNOSIS — F5105 Insomnia due to other mental disorder: Secondary | ICD-10-CM | POA: Diagnosis not present

## 2020-06-23 DIAGNOSIS — F17200 Nicotine dependence, unspecified, uncomplicated: Secondary | ICD-10-CM | POA: Diagnosis not present

## 2020-06-23 NOTE — Progress Notes (Signed)
Virtual Visit via Video Note  I connected with Raymond [name] on 06/23/20 at  9:00 AM EST by a video enabled telemedicine application and verified that I am speaking with the correct person using two identifiers. Location Provider Location : [facility] Patient Location : Home  Participants: Patient , Provider    I discussed the limitations of evaluation and management by telemedicine and the availability of in person appointments. The patient expressed understanding and agreed to proceed   I discussed the assessment and treatment plan with the patient. The patient was provided an opportunity to ask questions and all were answered. The patient agreed with the plan and demonstrated an understanding of the instructions.   The patient was advised to call back or seek an in-person evaluation if the symptoms worsen or if the condition fails to improve as anticipated.   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  Patient today reports since starting the Wellbutrin he has noticed an improvement in his mood symptoms.  He does not feel as depressed as he used to before.  He feels motivated to do things around the house.  He reports sleep as okay on the current medication regimen.  He does wake up in between due to increased frequency of urination due to current medications he is on.  However he is able to fall back asleep quickly.  Patient reports he continues to struggle with his neuropathy.  He does not have any pain anymore however he feels as though he has stiffness and numbness of his feet.  He has upcoming appointment with his  providers.  Patient reports that Wellbutrin did not help him to slow down smoking cigarettes.  He reports it is likely his smoking may have increased.  He however reports he wants to stay on the Wellbutrin since it is helpful for his mood.  He is planning to try nicotine lozenges for his smoking cessation.  Patient denies any suicidality, homicidality or perceptual disturbances.  Patient denies any other concerns today.  Visit Diagnosis:    ICD-10-CM   1. MDD (major depressive disorder), recurrent, in full remission ([place])  F33.42   2. PTSD (post-traumatic stress disorder)  F43.10   3. Tobacco use disorder  F17.200   4. Insomnia due to mental disorder  F51.05     Past Psychiatric History: I have reviewed past psychiatric history from my progress note on 05/23/2018.  Past trials of Prozac, Zoloft, Celexa, Tegretol, doxepin, Klonopin, Seroquel, mirtazapine, Belsomra, Lunesta, Ativan, Rozerem, Lexapro  Past Medical History:  Past Medical History:  Diagnosis Date  . Anxiety   . Arthritis    NECK  . Bronchitis   . Depression   . Diabetes mellitus without complication ([place])   . 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 Dyspnea    DUE TO GALLBLADDER PER PT  . Family history of adverse reaction to anesthesia    N/V, TROUBLE BREATHING COMING OUT OF ANESTHESIA  . Fatty liver   . GERD (gastroesophageal reflux disease)   . Headache    MIGRAINES  . Hyperlipidemia   . Hypertension   . Irregular heart beat unk  . Myocardial  infarction ([place]) 2010   MILD   . PTSD (post-traumatic stress disorder)   . Sleep apnea    USES CPAP  . Tachycardia     Past Surgical History:  Procedure Laterality Date  .  ingrown toenail removal    . CHOLECYSTECTOMY N/A 01/23/2019   Procedure: LAPAROSCOPIC CHOLECYSTECTOMY;  Surgeon: Olean Ree, MD;  Location: [facility] ORS;  Service: General;  Laterality: N/A;  . CLAVICLE SURGERY Right   . COLONOSCOPY  2017  . MOUTH SURGERY     REMOVED ALL TEETH  . SHOULDER  SURGERY Right   . UPPER GI ENDOSCOPY  2017    Family Psychiatric History: I have reviewed family psychiatric history from my progress note on 05/23/2018  Family History:  Family History  Problem Relation Age of Onset  . Hypertension Mother   . Diabetes type II Mother   . Diabetes Mother   . COPD Father   . Cancer Father   . Heart disease Father   . Diabetes Sister   . Anxiety disorder Sister   . Depression Sister   . Diabetes Brother   . Anxiety disorder Brother   . Depression Brother   . Diabetes Maternal Aunt   . Diabetes Sister   . Anxiety disorder Sister   . Depression Sister   . Diabetes Sister   . Anxiety disorder Sister   . Depression Sister   . Diabetes Sister   . Anxiety disorder Sister   . Depression Sister   . Colon cancer Maternal Uncle     Social History: I have reviewed social history from my progress note on 05/23/2018 Social History   Socioeconomic History  . Marital status: Divorced    Spouse name: Not on file  . Number of children: 4  . Years of education: Not on file  . Highest education level: 8th grade  Occupational History  . Not on file  Tobacco Use  . Smoking status: Current Every Day Smoker    Packs/day: 1.00    Years: 40.00    Pack years: 40.00    Types: Cigarettes  . Smokeless tobacco: Former Systems developer    Types: Chew  . Tobacco comment: 1 pack per day reported 04/07/2020  Vaping Use  . Vaping Use: Former  Substance and Sexual Activity  . Alcohol use: No    Alcohol/week: 0.0 standard drinks  . Drug use: No  . Sexual activity: Not Currently  Other Topics Concern  . Not on file  Social History Narrative  . Not on file   Social Determinants of Health   Financial Resource Strain: Not on file  Food Insecurity: Not on file  Transportation Needs: Not on file  Physical Activity: Not on file  Stress: Not on file  Social Connections: Not on file    Allergies:  Allergies  Allergen Reactions  . Onion Anaphylaxis  . Hydrocodone    . Norco [Hydrocodone-Acetaminophen] Itching  . Hydrocodone-Acetaminophen Itching  . Nickel Rash    Metabolic Disorder Labs: Lab Results  Component Value Date   HGBA1C 11.1 (A) 04/12/2020   MPG 294.83 05/31/2017   No results found for: PROLACTIN Lab Results  Component Value Date   CHOL 134 07/15/2015   TRIG 227 (H) 07/15/2015   HDL 28 (L) 07/15/2015   CHOLHDL 4.8 07/15/2015   VLDL 26 12/09/2012   LDLCALC 61 07/15/2015   LDLCALC 96 12/09/2012   Lab Results  Component Value Date   TSH 4.120 06/10/2018   TSH 2.000 07/15/2015  Therapeutic Level Labs: No results found for: LITHIUM No results found for: VALPROATE No components found for:  CBMZ  Current Medications: Current Outpatient Medications  Medication Sig Dispense Refill  . ACCU-CHEK FASTCLIX LANCETS MISC yse as directed twice a day 100 each 1  . albuterol (PROAIR HFA) 108 (90 Base) MCG/ACT inhaler Inhale 2-4 puffs by mouth Every 4-6 hours as needed for wheezing, cough, and/or shortness of breath 3 Inhaler 1  . aspirin EC 81 MG tablet Take 81 mg by mouth daily at 3 pm.     . atorvastatin (LIPITOR) 10 MG tablet Take 1 tablet (10 mg total) by mouth daily. 90 tablet 1  . buPROPion (WELLBUTRIN SR) 150 MG 12 hr tablet Take 1 tablet (150 mg total) by mouth daily. 30 tablet 1  . dapagliflozin propanediol (FARXIGA) 10 MG TABS tablet Take 1 tablet (10 mg total) by mouth daily. 30 tablet 0  . DULoxetine (CYMBALTA) 60 MG capsule Take 1 capsule (60 mg total) by mouth daily. 30 capsule 1  . famotidine (PEPCID) 20 MG tablet Take 1 tablet (20 mg total) by mouth 2 (two) times daily. 180 tablet 1  . glucose blood (ACCU-CHEK AVIVA PLUS) test strip Blood sugar testing TID and as needed. E11.65 100 each 12  . hydrOXYzine (ATARAX/VISTARIL) 10 MG tablet Take 1-2 tablets (10-20 mg total) by mouth at bedtime as needed. FOR SLEEP 30 tablet 2  . insulin glargine, 2 Unit Dial, (TOUJEO MAX SOLOSTAR) 300 UNIT/ML Solostar Pen Inject 70 Units into  the skin daily. Patient to increase dose to 70 units at bedtime 10 mL 3  . insulin lispro (HUMALOG KWIKPEN) 100 UNIT/ML KwikPen Use as directed per  sliding scale max units 8-10 per meal (Patient taking differently: Inject 12-15 Units into the skin 3 (three) times daily between meals. Sliding Scale Insulin) 15 mL 2  . Insulin Pen Needle 32G X 4 MM MISC Use with daily dose insulin and with sliding scale insulin as needed. 100 each 5  . metoprolol tartrate (LOPRESSOR) 100 MG tablet Take 1 tablet (100 mg total) by mouth 2 (two) times daily. 180 tablet 1  . mometasone-formoterol (DULERA) 100-5 MCG/ACT AERO Inhale 2 puffs into the lungs 2 (two) times daily. 13 g 2  . nicotine polacrilex (NICORETTE) 4 MG gum Take 1 each (4 mg total) by mouth as needed for smoking cessation. 100 tablet 0  . nitroGLYCERIN (NITROSTAT) 0.4 MG SL tablet Place 1 tablet (0.4 mg total) under the tongue every 5 (five) minutes x 3 doses as needed for chest pain. 30 tablet 1  . pantoprazole (PROTONIX) 40 MG tablet Take 1 tablet (40 mg total) by mouth daily. 90 tablet 1  . predniSONE (DELTASONE) 10 MG tablet Use per dose pack 21 tablet 0  . sitaGLIPtin-metformin (JANUMET) 50-500 MG tablet Take 1 tablet by mouth 2 (two) times daily. 180 tablet 1  . temazepam (RESTORIL) 30 MG capsule TAKE 1 CAPSULE BY MOUTH AT BEDTIME AS NEEDED FOR SLEEP 30 capsule 2   No current facility-administered medications for this visit.     Musculoskeletal: Strength & Muscle Tone: UTA Gait & Station: normal Patient leans: N/A  Psychiatric Specialty Exam: Review of Systems  Genitourinary: Positive for frequency.  Musculoskeletal:       BL Feet numbness and stiffness  Psychiatric/Behavioral: The patient is nervous/anxious.   All other systems reviewed and are negative.   There were no vitals taken for this visit.There is no height or weight on file to calculate BMI.  General Appearance: Casual  Eye Contact:  Fair  Speech:  Clear and Coherent   Volume:  Normal  Mood:  Anxious coping  Affect:  Congruent  Thought Process:  Goal Directed and Descriptions of Associations: Intact  Orientation:  Full (Time, Place, and Person)  Thought Content: Logical   Suicidal Thoughts:  No  Homicidal Thoughts:  No  Memory:  Immediate;   Fair Recent;   Fair Remote;   Fair  Judgement:  Fair  Insight:  Fair  Psychomotor Activity:  Normal  Concentration:  Concentration: Fair and Attention Span: Fair  Recall:  [employer] of Knowledge: Fair  Language: Fair  Akathisia:  No  Handed:  Right  AIMS (if indicated): UTA  Assets:  Communication Skills Desire for Improvement Housing Social Support  ADL's:  Intact  Cognition: WNL  Sleep:  Restless due to urinary problem   Screenings: Mini-Mental   Flowsheet Row Clinical Support from 10/17/2019 in [facility] [place] from 10/08/2018 in [facility], [place] from 10/05/2017 in [facility], [facility]  Total Score (max 30 points ) 30 27 30     PHQ2-9   Flowsheet Row Video Visit from 06/23/2020 in [place] Office Visit from 04/12/2020 in [facility], [facility] Office Visit from 01/16/2020 in [facility], [facility] Office Visit from 03/03/2019 in [facility], [facility] Office Visit from 01/16/2019 in [facility], [facility]  PHQ-2 Total Score 1 0 0 0 0    Flowsheet Row Video Visit from 06/23/2020 in [place] CATEGORY Low Risk       Assessment and Plan: Raymond [name] is a 55 year old Caucasian male, lives in [place], divorced, disabled, has a history of PTSD, MDD, insomnia, diabetes melitis, 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  Patient is biologically predisposed given his multiple health problems, history of trauma.  Patient with psychosocial stressors of several deaths in the family,  his own health issues.  Patient continues to struggle with neuropathy and has upcoming appointment with providers.  Patient otherwise is currently making progress with regards to his mood.  Plan as noted below.  Plan MDD-in remission Cymbalta as prescribed Wellbutrin SR 150 mg p.o. daily in the morning  Insomnia-improving Temazepam 30 mg p.o. nightly Continue CPAP for OSA Hydroxyzine 10 to 20 mg p.o. nightly as needed  PTSD-stable Cymbalta as prescribed  Tobacco use disorder-unstable Wellbutrin SR 150 mg p.o. daily in the morning. Patient is planning to start nicotine lozenges. Offered referral for smoking cessation classes, patient declines.  Follow-up in clinic in 6 to 8 weeks or sooner if needed.  This note was generated in part or whole with voice recognition software. Voice recognition is usually quite accurate but there are transcription errors that can and very often do occur. I apologize for any typographical errors that were not detected and corrected.       Ursula Alert, MD 06/23/2020, 9:07 PM

## 2020-07-07 IMAGING — NM NM HEPATOBILIARY IMAGE, INC GB
3 series · 8 of 8 positions shown · non-contrast
Comparison: CT 12/13/2018

CLINICAL DATA: Chronic abdominal pain.

EXAM:
NUCLEAR MEDICINE HEPATOBILIARY IMAGING
TECHNIQUE: Sequential images of the abdomen were obtained [DATE] minutes
following intravenous administration of radiopharmaceutical.
RADIOPHARMACEUTICALS:  4.8 mCi 6c-88m  Choletec IV

[Series 1000: hepatobiliary scan · 9.59mm/px · 6 of 60 frames shown]
[frame 6/60]
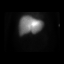
[frame 16/60]
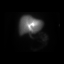
[frame 26/60]
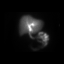
[frame 36/60]
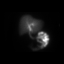
[frame 46/60]
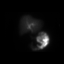
[frame 56/60]
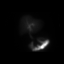

[Series 1000: gallbladder delays · 3.30mm/px · 1 of 1 slices shown (1 of 2)]
[im 1/1]
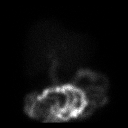

[Series 1000: gallbladder delays · 3.30mm/px · 1 of 1 slices shown (2 of 2)]
[im 1/1]
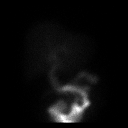

[8 of 8 positions shown; findings below may reference images not displayed]

FINDINGS: Prompt clearance of radiotracer from the blood pool and homogeneous
uptake within liver parenchyma. Counts are excreted into the common
bile duct by 20 minutes. Counts continue to flow into the
extrahepatic ducts and small bowel over the course of 90 minutes.
The gallbladder does not fill by 90 minutes. Patient cannot receive
IV morphine as no driver available.

Gallbladder ejection fraction could not be calculated as no filling
was demonstrated.
IMPRESSION: Non filling of the gallbladder over 90 minutes. Gallbladder
contracted on comparison CT 12/13/2018. Findings could indicate
chronic cholecystitis. Patient does not have symptomology consistent
with acute cholecystitis.

## 2020-07-12 ENCOUNTER — Ambulatory Visit (INDEPENDENT_AMBULATORY_CARE_PROVIDER_SITE_OTHER): Payer: Medicare Other | Admitting: Hospice and Palliative Medicine

## 2020-07-12 ENCOUNTER — Other Ambulatory Visit: Payer: Self-pay

## 2020-07-12 ENCOUNTER — Encounter: Payer: Self-pay | Admitting: Hospice and Palliative Medicine

## 2020-07-12 VITALS — BP 130/80 | HR 80 | Temp 97.8°F | Resp 16 | Ht 69.0 in | Wt 232.2 lb

## 2020-07-12 DIAGNOSIS — E1165 Type 2 diabetes mellitus with hyperglycemia: Secondary | ICD-10-CM | POA: Diagnosis not present

## 2020-07-12 DIAGNOSIS — F17219 Nicotine dependence, cigarettes, with unspecified nicotine-induced disorders: Secondary | ICD-10-CM

## 2020-07-12 DIAGNOSIS — Z125 Encounter for screening for malignant neoplasm of prostate: Secondary | ICD-10-CM

## 2020-07-12 DIAGNOSIS — Z122 Encounter for screening for malignant neoplasm of respiratory organs: Secondary | ICD-10-CM | POA: Diagnosis not present

## 2020-07-12 DIAGNOSIS — R5383 Other fatigue: Secondary | ICD-10-CM

## 2020-07-12 DIAGNOSIS — Z1211 Encounter for screening for malignant neoplasm of colon: Secondary | ICD-10-CM

## 2020-07-12 LAB — POCT GLYCOSYLATED HEMOGLOBIN (HGB A1C): Hemoglobin A1C: 10.7 % — AB (ref 4.0–5.6)

## 2020-07-12 MED ORDER — FREESTYLE LIBRE 2 READER DEVI: 0 refills | Status: DC

## 2020-07-12 NOTE — Progress Notes (Signed)
[facility] [place], [place] [address]  Internal MEDICINE  Office Visit Note  Patient Name: Raymond [name]  [id number]  [id number]  Date of Service: 07/12/2020  Chief Complaint  Patient presents with  . Depression  . Hypertension  . Hyperlipidemia  . Anxiety  . Quality Metric Gaps    Colonoscopy and covid shot      HPI Patient is here for routine follow-up -DM-glucose levels continue to be elevated, getting tired of injections and pricking finger multiple times per day to check glucose levels Will occasionally miss mealtime insulin coverage -Shortness of breath, continues to smoke about 1-1.5 packs per day, smoking since he was 55 years old, has not had recent pulmonary work-up Has had cardiac work-up which in unrevealing for cause of shortness of breath  Followed by psychiatry--recently started on Cymbalta for depression and to help control pain related to neuropathy Since starting has noticed improvement in his symptoms but is having urinary symptoms--known side effect, having difficulty initiating stream--willing to deal with urinary symptoms since there is a noticeable improvement in his pain  Due for screening colonoscopy  Current Medication: Outpatient Encounter Medications as of 07/12/2020  Medication Sig  . ACCU-CHEK FASTCLIX LANCETS MISC yse as directed twice a day  . albuterol (PROAIR HFA) 108 (90 Base) MCG/ACT inhaler Inhale 2-4 puffs by mouth Every 4-6 hours as needed for wheezing, cough, and/or shortness of breath  . aspirin EC 81 MG tablet Take 81 mg by mouth daily at 3 pm.   . atorvastatin (LIPITOR) 10 MG tablet Take 1 tablet (10 mg total) by mouth daily.  Marland Kitchen buPROPion (WELLBUTRIN SR) 150 MG 12 hr tablet Take 1 tablet (150 mg total) by mouth daily.  . dapagliflozin propanediol (FARXIGA) 10 MG TABS tablet Take 1 tablet (10 mg total) by mouth daily.  . DULoxetine (CYMBALTA) 60 MG capsule Take 1 capsule (60 mg total) by mouth daily.  .  famotidine (PEPCID) 20 MG tablet Take 1 tablet (20 mg total) by mouth 2 (two) times daily.  Marland Kitchen glucose blood (ACCU-CHEK AVIVA PLUS) test strip Blood sugar testing TID and as needed. E11.65  . hydrOXYzine (ATARAX/VISTARIL) 10 MG tablet Take 1-2 tablets (10-20 mg total) by mouth at bedtime as needed. FOR SLEEP  . insulin glargine, 2 Unit Dial, (TOUJEO MAX SOLOSTAR) 300 UNIT/ML Solostar Pen Inject 70 Units into the skin daily. Patient to increase dose to 70 units at bedtime  . insulin lispro (HUMALOG KWIKPEN) 100 UNIT/ML KwikPen Use as directed per  sliding scale max units 8-10 per meal (Patient taking differently: Inject 12-15 Units into the skin 3 (three) times daily between meals. Sliding Scale Insulin)  . Insulin Pen Needle 32G X 4 MM MISC Use with daily dose insulin and with sliding scale insulin as needed.  . metoprolol tartrate (LOPRESSOR) 100 MG tablet Take 1 tablet (100 mg total) by mouth 2 (two) times daily.  . mometasone-formoterol (DULERA) 100-5 MCG/ACT AERO Inhale 2 puffs into the lungs 2 (two) times daily.  . nicotine polacrilex (NICORETTE) 4 MG gum Take 1 each (4 mg total) by mouth as needed for smoking cessation.  . nitroGLYCERIN (NITROSTAT) 0.4 MG SL tablet Place 1 tablet (0.4 mg total) under the tongue every 5 (five) minutes x 3 doses as needed for chest pain.  . pantoprazole (PROTONIX) 40 MG tablet Take 1 tablet (40 mg total) by mouth daily.  . sitaGLIPtin-metformin (JANUMET) 50-500 MG tablet Take 1 tablet by mouth 2 (two) times daily.  Marland Kitchen  temazepam (RESTORIL) 30 MG capsule TAKE 1 CAPSULE BY MOUTH AT BEDTIME AS NEEDED FOR SLEEP  . [DISCONTINUED] Continuous Blood Gluc Receiver (FREESTYLE LIBRE 2 READER) DEVI by Does not apply route.  . [DISCONTINUED] predniSONE (DELTASONE) 10 MG tablet Use per dose pack   No facility-administered encounter medications on file as of 07/12/2020.    Surgical History: Past Surgical History:  Procedure Laterality Date  .  ingrown toenail removal    .  CHOLECYSTECTOMY N/A 01/23/2019   Procedure: LAPAROSCOPIC CHOLECYSTECTOMY;  Surgeon: Olean Ree, MD;  Location: [facility] ORS;  Service: General;  Laterality: N/A;  . CLAVICLE SURGERY Right   . COLONOSCOPY  2017  . MOUTH SURGERY     REMOVED ALL TEETH  . SHOULDER SURGERY Right   . UPPER GI ENDOSCOPY  2017    Medical History: Past Medical History:  Diagnosis Date  . Anxiety   . Arthritis    NECK  . Bronchitis   . Depression   . Diabetes mellitus without complication ([place])   . 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 Dyspnea    DUE TO GALLBLADDER PER PT  . Family history of adverse reaction to anesthesia    N/V, TROUBLE BREATHING COMING OUT OF ANESTHESIA  . Fatty liver   . GERD (gastroesophageal reflux disease)   . Headache    MIGRAINES  . Hyperlipidemia   . Hypertension   . Irregular heart beat unk  . Myocardial infarction ([place]) 2010   MILD   . PTSD (post-traumatic stress disorder)   . Sleep apnea    USES CPAP  . Tachycardia     Family History: Family History  Problem Relation Age of Onset  . Hypertension Mother   . Diabetes type II Mother   . Diabetes Mother   . COPD Father   . Cancer Father   . Heart disease Father   . Diabetes Sister   . Anxiety disorder Sister   . Depression Sister   . Diabetes Brother   . Anxiety disorder Brother   . Depression Brother   . Diabetes Maternal Aunt   . Diabetes Sister   . Anxiety disorder Sister   . Depression Sister   . Diabetes Sister   . Anxiety disorder Sister   . Depression Sister   . Diabetes Sister   . Anxiety disorder Sister   . Depression Sister   . Colon cancer Maternal Uncle     Social History   Socioeconomic History  . Marital status: Divorced    Spouse name: Not on file  . Number of children: 4  . Years of education: Not on file  . Highest education level: 8th grade  Occupational History  . Not on file  Tobacco Use  . Smoking status: Current Every Day Smoker    Packs/day: 1.00    Years: 40.00     Pack years: 40.00    Types: Cigarettes  . Smokeless tobacco: Former Systems developer    Types: Chew  . Tobacco comment: 1 pack per day reported 04/07/2020  Vaping Use  . Vaping Use: Former  Substance and Sexual Activity  . Alcohol use: No    Alcohol/week: 0.0 standard drinks  . Drug use: No  . Sexual activity: Not Currently  Other Topics Concern  . Not on file  Social History Narrative  . Not on file   Social Determinants of Health   Financial Resource Strain: Not on file  Food Insecurity: Not on file  Transportation Needs: Not on file  Physical Activity: Not on file  Stress: Not on file  Social Connections: Not on file  Intimate Partner Violence: Not on file      Review of Systems  Constitutional: Negative for chills, fatigue and unexpected weight change.  HENT: Negative for congestion, postnasal drip, rhinorrhea, sneezing and sore throat.   Eyes: Negative for redness.  Respiratory: Positive for shortness of breath. Negative for cough and chest tightness.   Cardiovascular: Negative for chest pain and palpitations.  Gastrointestinal: Negative for abdominal pain, constipation, diarrhea, nausea and vomiting.  Genitourinary: Negative for dysuria and frequency.  Musculoskeletal: Negative for arthralgias, back pain, joint swelling and neck pain.  Skin: Negative for rash.  Neurological: Negative for tremors and numbness.  Hematological: Negative for adenopathy. Does not bruise/bleed easily.  Psychiatric/Behavioral: Positive for behavioral problems (Depression). Negative for sleep disturbance and suicidal ideas. The patient is nervous/anxious.     Vital Signs: BP 130/80   Pulse 80   Temp 97.8 F (36.6 C)   Resp 16   Ht 5\' 9"  (1.753 m)   Wt 232 lb 3.2 oz (105.3 kg)   SpO2 96%   BMI 34.29 kg/m    Physical Exam Vitals reviewed.  Constitutional:      Appearance: Normal appearance. He is normal weight.  Cardiovascular:     Rate and Rhythm: Normal rate and regular rhythm.      Pulses: Normal pulses.     Heart sounds: Normal heart sounds.  Pulmonary:     Effort: Pulmonary effort is normal.     Breath sounds: Normal breath sounds.  Abdominal:     General: Abdomen is flat.     Palpations: Abdomen is soft.  Musculoskeletal:        General: Normal range of motion.     Cervical back: Normal range of motion.  Skin:    General: Skin is warm.  Neurological:     General: No focal deficit present.     Mental Status: He is alert and oriented to person, place, and time. Mental status is at baseline.  Psychiatric:        Mood and Affect: Mood normal.        Behavior: Behavior normal.        Thought Content: Thought content normal.        Judgment: Judgment normal.    Assessment/Plan: 1. Uncontrolled type 2 diabetes mellitus with hyperglycemia (HCC) A1C slightly improved at 10.7, set up with continuous glucose monitoring in office to reduce needle sticks Encouraged to adhere to insulin therapy and avoid missing doses Consider endocrinology referral//insulin pump - POCT HgB A1C  2. Cigarette nicotine dependence with nicotine-induced disorder Obtain updated PFT for ongoing cigarette smoking and shortness of breath Low dose CT - Pulmonary function test; Future - Ambulatory Referral for Lung Cancer Scre  3. Screening for colon cancer - Ambulatory referral to Gastroenterology  4. Screening for prostate cancer - PSA, total and free  5. Screening for lung cancer Referral placed for low dose CT  6. Other fatigue - CBC w/Diff/Platelet - Comprehensive Metabolic Panel (CMET) - Lipid Panel With LDL/HDL Ratio - TSH + free T4 - PSA, total and free  General Counseling: Rishikesh verbalizes understanding of the findings of todays visit and agrees with plan of treatment. I have discussed any further diagnostic evaluation that may be needed or ordered today. We also reviewed his medications today. he has been encouraged to call the office with any questions or concerns  that should arise related to  todays visit.    Orders Placed This Encounter  Procedures  . CBC w/Diff/Platelet  . Comprehensive Metabolic Panel (CMET)  . Lipid Panel With LDL/HDL Ratio  . TSH + free T4  . PSA, total and free  . Ambulatory Referral for Lung Cancer Scre  . Ambulatory referral to Gastroenterology  . POCT HgB A1C  . Pulmonary function test    Time spent: 30 Minutes Time spent includes review of chart, medications, test results and follow-up plan with the patient.  This patient was seen by Theodoro Grist AGNP-C in Collaboration with Dr Lavera Guise as a part of collaborative care agreement     Tanna Furry. Aeden Matranga AGNP-C Internal medicine

## 2020-07-13 ENCOUNTER — Other Ambulatory Visit: Payer: Self-pay

## 2020-07-13 MED ORDER — FREESTYLE LIBRE 14 DAY SENSOR MISC: 1.0000 | 12 refills | Status: DC

## 2020-07-13 MED ORDER — FREESTYLE LIBRE 2 READER DEVI: 5 refills | Status: DC

## 2020-07-16 ENCOUNTER — Telehealth: Payer: Self-pay | Admitting: Internal Medicine

## 2020-07-16 NOTE — Progress Notes (Signed)
  Chronic Care Management   Note  07/16/2020 Name: JAHON BART MRN: [id number] DOB: 1965/12/13  DORIEN MAYOTTE is a 55 y.o. year old male who is a primary care patient of Lavera Guise, MD. I reached out to [name] by phone today in response to a referral sent by Mr. Franki Cabot Tosi's PCP, Lavera Guise, MD.   Mr. Delpriore was given information about Chronic Care Management services today including:  1. CCM service includes personalized support from designated clinical staff supervised by his physician, including individualized plan of care and coordination with other care providers 2. 24/7 contact phone numbers for assistance for urgent and routine care needs. 3. Service will only be billed when office clinical staff spend 20 minutes or more in a month to coordinate care. 4. Only one practitioner may furnish and bill the service in a calendar month. 5. The patient may stop CCM services at any time (effective at the end of the month) by phone call to the office staff.   Patient agreed to services and verbal consent obtained.   Follow up plan:   Carley Perdue [employer] Scheduler

## 2020-07-18 IMAGING — US US EXTREM LOW VENOUS*L*
1 series · 13 of 24 positions shown · non-contrast
Comparison: None.

CLINICAL DATA: Leg pain after surgery



[Series 1: us extrem low venous*left* · 13 of 34 slices shown]
[im 1/34]
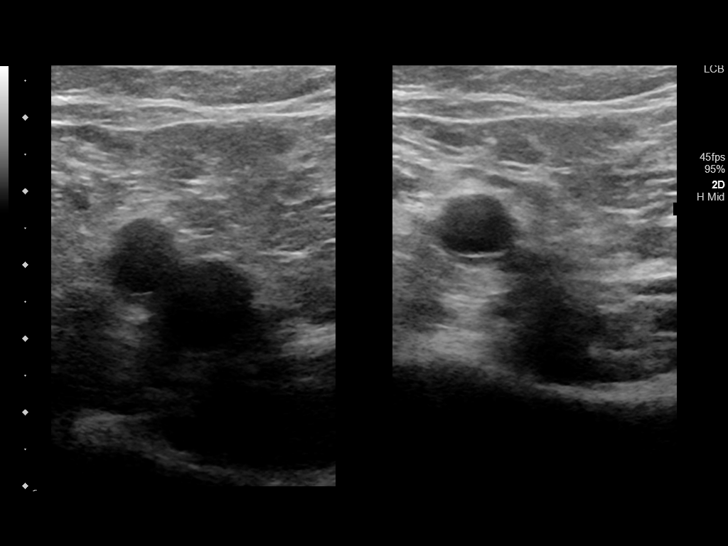
[im 3/34]
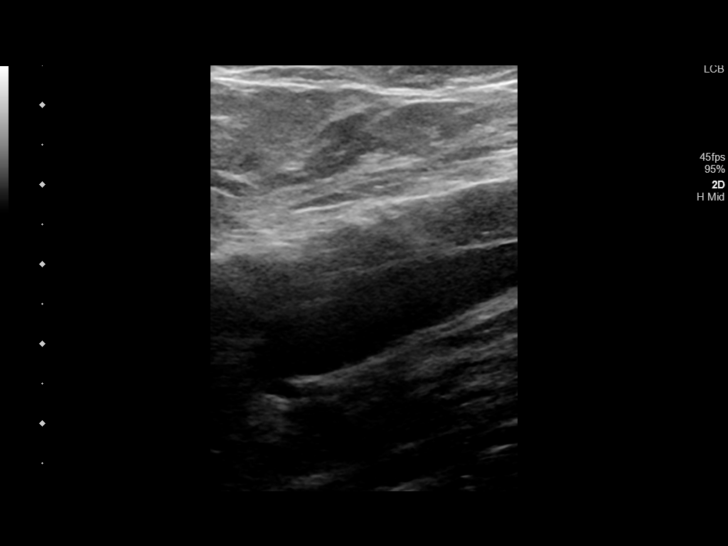
[im 6/34]
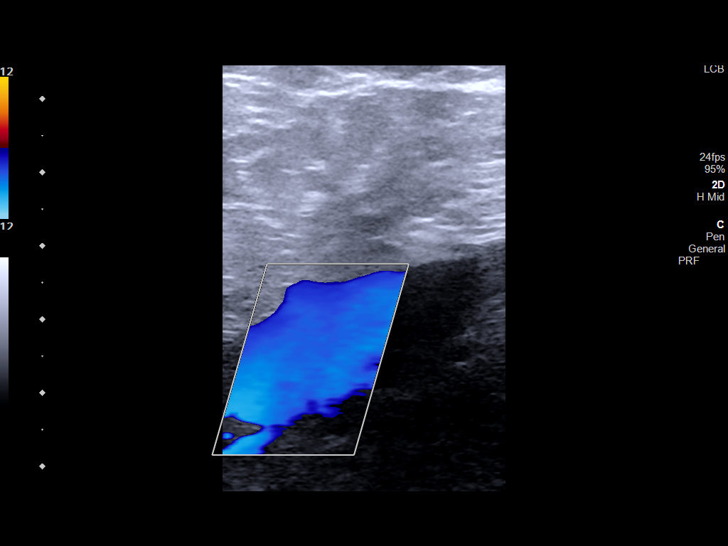
[im 9/34]
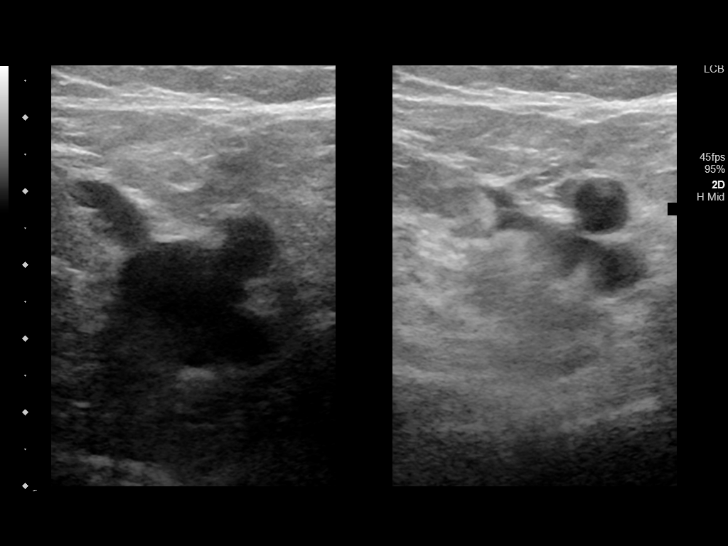
[im 12/34]
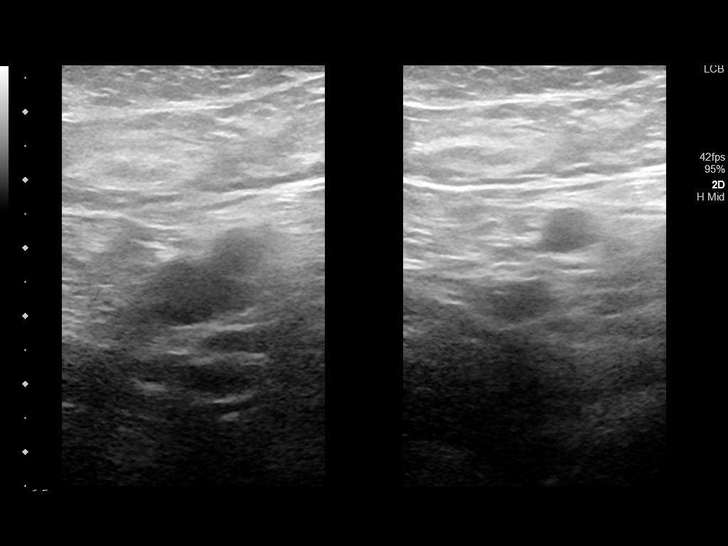
[im 15/34]
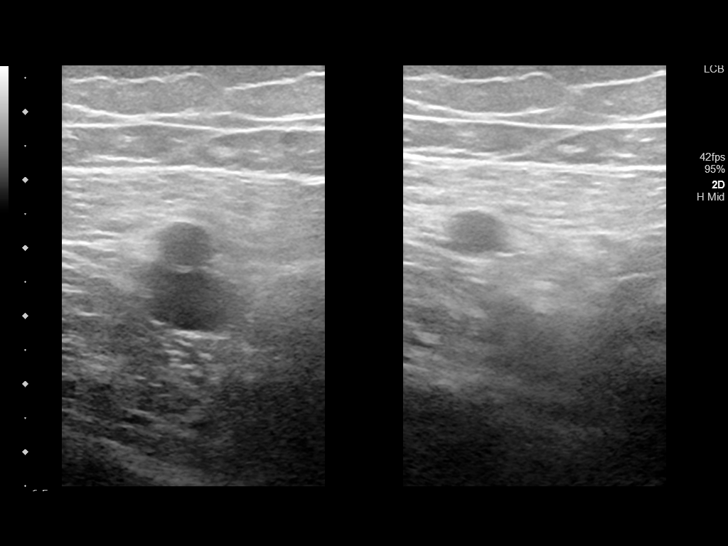
[im 18/34]
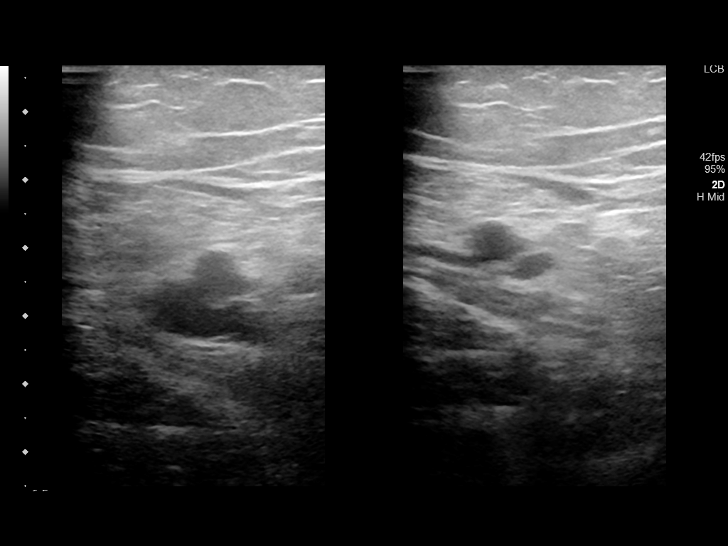
[im 19/34]
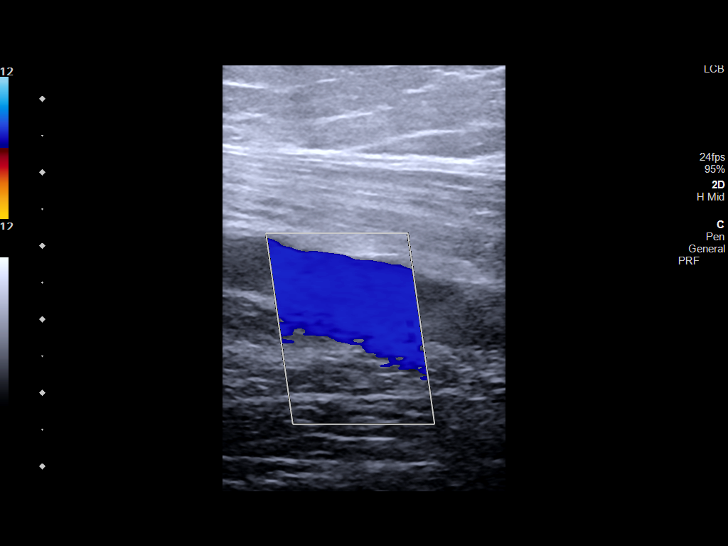
[im 22/34]
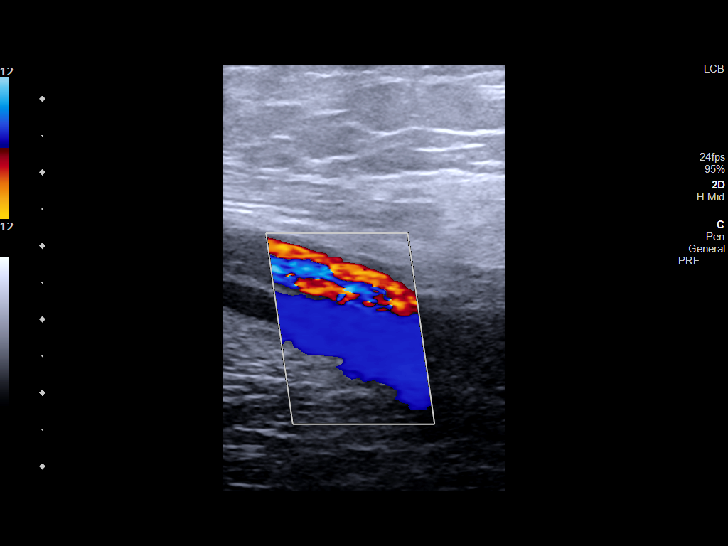
[im 25/34]
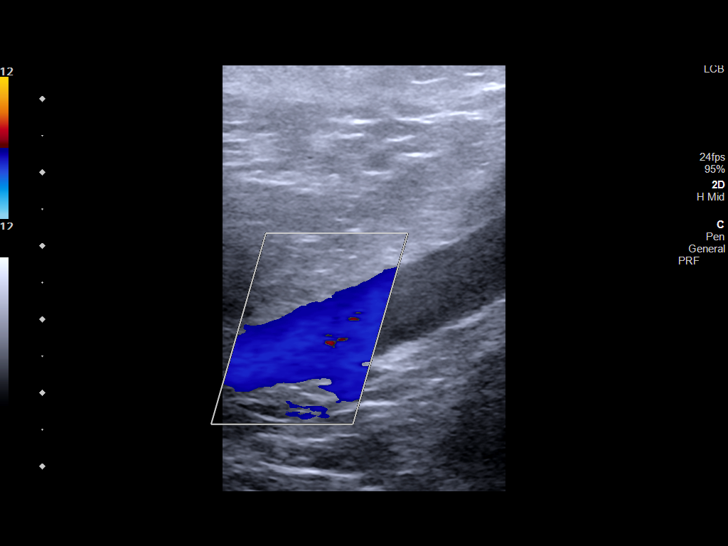
[im 28/34]
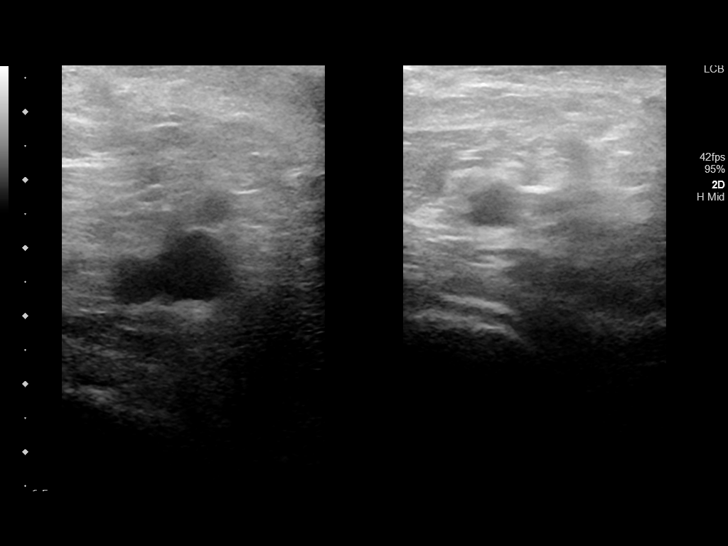
[im 31/34]
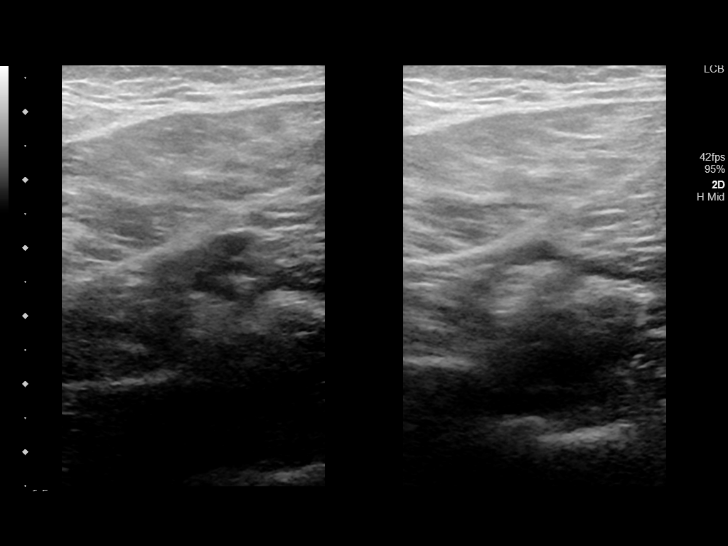
[im 34/34]
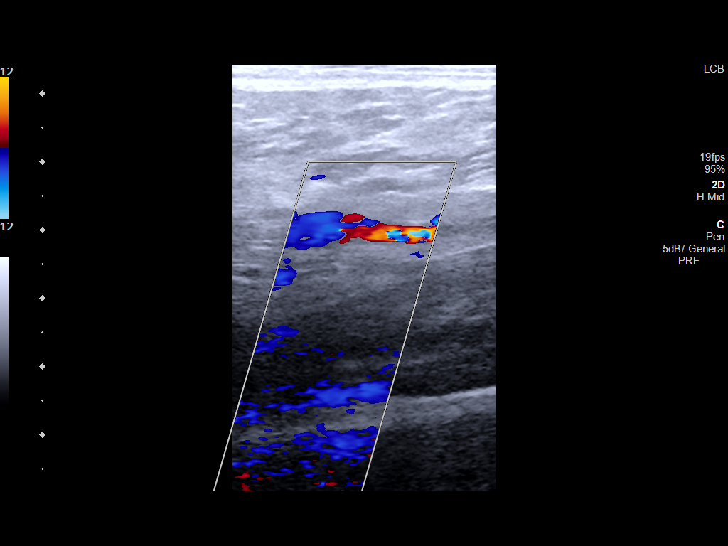

[13 of 24 positions shown; findings below may reference images not displayed]

FINDINGS: Contralateral Common Femoral Vein: Respiratory phasicity is normal
and symmetric with the symptomatic side. No evidence of thrombus.
Normal compressibility.

Common Femoral Vein: No evidence of thrombus. Normal
compressibility, respiratory phasicity and response to augmentation.

Saphenofemoral Junction: No evidence of thrombus. Normal
compressibility and flow on color Doppler imaging.

Profunda Femoral Vein: No evidence of thrombus. Normal
compressibility and flow on color Doppler imaging.

Femoral Vein: No evidence of thrombus. Normal compressibility,
respiratory phasicity and response to augmentation.

Popliteal Vein: No evidence of thrombus. Normal compressibility,
respiratory phasicity and response to augmentation.

Calf Veins: No evidence of thrombus. Normal compressibility and flow
on color Doppler imaging.

Superficial Great Saphenous Vein: No evidence of thrombus. Normal
compressibility.

Venous Reflux:  None.

Other Findings:  None.
IMPRESSION: No evidence of deep venous thrombosis.

## 2020-07-27 ENCOUNTER — Telehealth: Payer: Self-pay | Admitting: *Deleted

## 2020-07-27 DIAGNOSIS — Z122 Encounter for screening for malignant neoplasm of respiratory organs: Secondary | ICD-10-CM

## 2020-07-27 DIAGNOSIS — F17200 Nicotine dependence, unspecified, uncomplicated: Secondary | ICD-10-CM

## 2020-07-27 DIAGNOSIS — Z87891 Personal history of nicotine dependence: Secondary | ICD-10-CM

## 2020-07-27 NOTE — Telephone Encounter (Signed)
Received referral for initial lung cancer screening scan. Contacted patient and obtained smoking history,(current every day smoker, 1 ppd x 40 yrs) as well as answering questions related to screening process. Patient denies signs of lung cancer such as weight loss or hemoptysis. Patient denies comorbidity that would prevent curative treatment if lung cancer were found. Patient is scheduled for shared decision making visit and CT scan on 08/11/20 at 10:45 am. Patient has PFT's scheduled the same day at [facility] at 10:00 am.

## 2020-08-04 ENCOUNTER — Other Ambulatory Visit: Payer: Self-pay | Admitting: Psychiatry

## 2020-08-04 DIAGNOSIS — F5105 Insomnia due to other mental disorder: Secondary | ICD-10-CM

## 2020-08-06 NOTE — Progress Notes (Signed)
Chronic Care Management Pharmacy Note  08/17/2020 Name:  Raymond [name] MRN:  [id number] DOB:  May 21, 1965  Subjective: Raymond [name] is an 55 y.o. year old male who is a primary patient of Humphrey Rolls Timoteo Gaul, MD.  The CCM team was consulted for assistance with disease management and care coordination needs.    Engaged with patient face to face for follow up visit in response to provider referral for pharmacy case management and/or care coordination services.   Consent to Services:  The patient was given the following information about Chronic Care Management services today, agreed to services, and gave verbal consent: 1. CCM service includes personalized support from designated clinical staff supervised by the primary care provider, including individualized plan of care and coordination with other care providers 2. 24/7 contact phone numbers for assistance for urgent and routine care needs. 3. Service will only be billed when office clinical staff spend 20 minutes or more in a month to coordinate care. 4. Only one practitioner may furnish and bill the service in a calendar month. 5.The patient may stop CCM services at any time (effective at the end of the month) by phone call to the office staff. 6. The patient will be responsible for cost sharing (co-pay) of up to 20% of the service fee (after annual deductible is met). Patient agreed to services and consent obtained.  Patient Care Team: Lavera Guise, MD as PCP - General (Internal Medicine) Edythe Clarity, [facility] as Pharmacist (Pharmacist)  Recent office visits: 07/12/20 [place]) - reportedly occasionally misses mealtime insulin, recently started Cymbalta for nerve pain.  No med changes at this time, patient was set up with CGM to reduce sticks.  12/20/2 ([place]) - compliance is an issue. No medication changes Recent consult visits: 06/23/20 ([place]) - no med changes, patient plans to start lozenges to smoking  cessation.  04/07/20 ([place]) - No medication changes at this visit, continue as previous.  Hospital visits: None in previous 6 months   Objective:  Lab Results  Component Value Date   CREATININE 1.05 03/09/2020   BUN 14 03/09/2020   GFRNONAA >60 03/09/2020   GFRAA >60 01/25/2019   NA 133 (L) 03/09/2020   K 4.1 03/09/2020   CALCIUM 9.5 03/09/2020   CO2 21 (L) 03/09/2020   GLUCOSE 291 (H) 03/09/2020    Lab Results  Component Value Date/Time   HGBA1C 10.7 (A) 07/12/2020 09:03 AM   HGBA1C 11.1 (A) 04/12/2020 11:08 AM   HGBA1C 11.9 (H) 05/31/2017 04:49 AM   HGBA1C 8.5 (H) 07/15/2015 06:32 PM   HGBA1C 6.2 12/09/2012 05:30 AM    Last diabetic Eye exam: No results found for: HMDIABEYEEXA  Last diabetic Foot exam: No results found for: HMDIABFOOTEX   Lab Results  Component Value Date   CHOL 134 07/15/2015   HDL 28 (L) 07/15/2015   LDLCALC 61 07/15/2015   TRIG 227 (H) 07/15/2015   CHOLHDL 4.8 07/15/2015    Hepatic Function Latest Ref Rng & Units 03/09/2020 01/25/2019 11/13/2018  Total Protein 6.5 - 8.1 g/dL 8.5(H) 8.1 7.6  Albumin 3.5 - 5.0 g/dL 4.1 3.8 4.4  AST 15 - 41 U/L 28 52(H) 57(H)  ALT 0 - 44 U/L 26 73(H) 47(H)  Alk Phosphatase 38 - 126 U/L 109 104 125(H)  Total Bilirubin 0.3 - 1.2 mg/dL 0.6 1.1 <0.2  Bilirubin, Direct 0.0 - 0.2 mg/dL <0.1 - -    Lab Results  Component Value Date/Time   TSH 4.120 06/10/2018  11:36 AM   TSH 2.000 07/15/2015 06:32 PM    CBC Latest Ref Rng & Units 01/25/2019 01/20/2019 08/20/2018  WBC 4.0 - 10.5 K/uL 11.9(H) 11.4(H) 9.2  Hemoglobin 13.0 - 17.0 g/dL 14.7 15.1 14.7  Hematocrit 39.0 - 52.0 % 43.1 43.8 43.1  Platelets 150 - 400 K/uL 189 214 214    No results found for: VD25OH  Clinical ASCVD: Yes  The ASCVD Risk score Mikey Bussing DC Jr., et al., 2013) failed to calculate for the following reasons:   Cannot find a previous HDL lab   Cannot find a previous total cholesterol lab    Depression screen [facility] 2/9 07/12/2020 06/23/2020  04/12/2020  Decreased Interest 0 0 0  Down, Depressed, Hopeless 0 1 0  PHQ - 2 Score 0 1 0  Altered sleeping - - -  Tired, decreased energy - - -  Change in appetite - - -  Feeling bad or failure about yourself  - - -  Trouble concentrating - - -  Moving slowly or fidgety/restless - - -  Suicidal thoughts - - -  PHQ-9 Score - - -  Difficult doing work/chores - - -      Social History   Tobacco Use  Smoking Status Current Every Day Smoker  . Packs/day: 1.00  . Years: 40.00  . Pack years: 40.00  . Types: Cigarettes  Smokeless Tobacco Former Systems developer  . Types: Chew  Tobacco Comment   1 pack per day reported 04/07/2020   BP Readings from Last 3 Encounters:  08/09/20 (!) 151/91  07/12/20 130/80  04/12/20 136/82   Pulse Readings from Last 3 Encounters:  08/09/20 76  07/12/20 80  04/12/20 100   Wt Readings from Last 3 Encounters:  08/11/20 233 lb (105.7 kg)  08/09/20 234 lb 6.4 oz (106.3 kg)  07/12/20 232 lb 3.2 oz (105.3 kg)   BMI Readings from Last 3 Encounters:  08/11/20 34.41 kg/m  08/09/20 34.61 kg/m  07/12/20 34.29 kg/m    Assessment/Interventions: Review of patient past medical history, allergies, medications, health status, including review of consultants reports, laboratory and other test data, was performed as part of comprehensive evaluation and provision of chronic care management services.   SDOH:  (Social Determinants of Health) assessments and interventions performed: Yes   Financial Resource Strain: Low Risk   . Difficulty of Paying Living Expenses: Not very hard    SDOH Screenings   Alcohol Screen: Low Risk   . Last Alcohol Screening Score (AUDIT): 0  Depression (PHQ2-9): Low Risk   . PHQ-2 Score: 0  Financial Resource Strain: Low Risk   . Difficulty of Paying Living Expenses: Not very hard  Food Insecurity: Not on file  Housing: Not on file  Physical Activity: Not on file  Social Connections: Not on file  Stress: Not on file  Tobacco  Use: High Risk  . Smoking Tobacco Use: Current Every Day Smoker  . Smokeless Tobacco Use: Former Soil scientist Needs: Not on file    [place]  Allergies  Allergen Reactions  . Onion Anaphylaxis  . Hydrocodone   . Norco [Hydrocodone-Acetaminophen] Itching  . Hydrocodone-Acetaminophen Itching  . Nickel Rash    Medications Reviewed Today    Reviewed by Edythe Clarity, [facility] (Pharmacist) on 08/17/20 at 0954  Med List Status: <None>  Medication Order Taking? Sig Documenting Provider Last Dose Status Informant  ACCU-CHEK FASTCLIX LANCETS MISC [id number] Yes yse as directed twice a day Ronnell Freshwater, NP Taking Active  Self  albuterol (PROAIR HFA) 108 (90 Base) MCG/ACT inhaler [id number] Yes Inhale 2-4 puffs by mouth Every 4-6 hours as needed for wheezing, cough, and/or shortness of breath Ronnell Freshwater, NP Taking Active Self  aspirin EC 81 MG tablet [id number] Yes Take 81 mg by mouth daily at 3 pm.  [provider] Taking Active Self  atorvastatin (LIPITOR) 10 MG tablet [id number] Yes Take 1 tablet (10 mg total) by mouth daily. Ronnell Freshwater, NP Taking Active Self  buPROPion [facility] SR) 150 MG 12 hr tablet [id number] Yes Take 1 tablet (150 mg total) by mouth 2 (two) times daily. Ursula Alert, MD Taking Active   colestipol (COLESTID) 1 g tablet [id number] Yes Take 2 tablets (2 g total) by mouth daily for 30 doses. Virgel Manifold, MD Taking Active   Continuous Blood Gluc Receiver (FREESTYLE LIBRE 2 READER) DEVI [id number] Yes Use as directed to read blood sugar 4 times a day  Dx E11.65 Lavera Guise, MD Taking Active Self  Continuous Blood Gluc Sensor (FREESTYLE LIBRE [place]) [place] [id number] Yes 1 each by Does not apply route every 14 (fourteen) days. Apply to skin every 14 days E11.65 Lavera Guise, MD Taking Active Self  dapagliflozin propanediol (FARXIGA) 10 MG TABS tablet [id number] No Take 1 tablet (10 mg total) by mouth daily.  Patient not  taking: Reported on 08/17/2020   Ronnell Freshwater, NP Not Taking Active Self  DULoxetine (CYMBALTA) 60 MG capsule [id number] Yes Take 1 capsule (60 mg total) by mouth daily. Ursula Alert, MD Taking Active   famotidine (PEPCID) 20 MG tablet [id number] Yes Take 1 tablet (20 mg total) by mouth 2 (two) times daily. Ronnell Freshwater, NP Taking Active Self  glucose blood (ACCU-CHEK AVIVA PLUS) test strip [id number] Yes Blood sugar testing TID and as needed. E11.65 Ronnell Freshwater, NP Taking Active Self  hydrOXYzine (ATARAX/VISTARIL) 10 MG tablet [id number] Yes Take 1-2 tablets (10-20 mg total) by mouth at bedtime as needed. FOR SLEEP Ursula Alert, MD Taking Active   insulin glargine, 2 Unit Dial, (TOUJEO MAX SOLOSTAR) 300 UNIT/ML Solostar Pen [id number] Yes Inject 70 Units into the skin daily. Patient to increase dose to 70 units at bedtime Ronnell Freshwater, NP Taking Active Self  insulin lispro (HUMALOG KWIKPEN) 100 UNIT/ML KwikPen [id number] Yes Use as directed per  sliding scale max units 8-10 per meal  Patient taking differently: Inject 12-15 Units into the skin 3 (three) times daily between meals. Sliding Scale Insulin   Ronnell Freshwater, NP Taking Active Self  Insulin Pen Needle 32G X 4 MM MISC [id number] Yes Use with daily dose insulin and with sliding scale insulin as needed. Ronnell Freshwater, NP Taking Active Self  metoprolol tartrate (LOPRESSOR) 100 MG tablet [id number] Yes Take 1 tablet (100 mg total) by mouth 2 (two) times daily. Ronnell Freshwater, NP Taking Active Self  mometasone-formoterol (DULERA) 100-5 MCG/ACT Hollie Salk [id number] Yes Inhale 2 puffs into the lungs 2 (two) times daily. Kendell Bane, NP Taking Active Self  Na Sulfate-K Sulfate-Mg Sulf 17.5-3.13-1.6 GM/177ML SOLN [id number] Yes At 5 PM the day before procedure take 1 bottle and 5 hours before procedure take 1 bottle. Virgel Manifold, MD Taking Active   nicotine polacrilex (NICORETTE) 4 MG gum [id number] Yes Take 1 each (4  mg total) by mouth as needed for smoking cessation. Ursula Alert, MD Taking Active Self           Med Note Aretha Parrot, MICHELLE  S   Mon Aug 09, 2020  1:29 PM) Not taking due to cost of rx.    nitroGLYCERIN (NITROSTAT) 0.4 MG SL tablet [id number] Yes Place 1 tablet (0.4 mg total) under the tongue every 5 (five) minutes x 3 doses as needed for chest pain. Ronnell Freshwater, NP Taking Active Self  pantoprazole (PROTONIX) 40 MG tablet [id number] Yes Take 1 tablet (40 mg total) by mouth daily. Ronnell Freshwater, NP Taking Active Self  sitaGLIPtin-metformin (JANUMET) 50-500 MG tablet [id number] Yes Take 1 tablet by mouth 2 (two) times daily. Ronnell Freshwater, NP Taking Active Self  temazepam (RESTORIL) 30 MG capsule [id number] Yes Take 1 capsule (30 mg total) by mouth at bedtime. Ursula Alert, MD Taking Active           Patient Active Problem List   Diagnosis Date Noted  . MDD (major depressive disorder), recurrent episode, mild ([place]) 06/23/2020  . Noncompliance with diabetes treatment 04/12/2020  . Angina pectoris ([place]) 04/12/2020  . MDD (major depressive disorder), recurrent, in full remission ([place]) 01/05/2020  . MDD (major depressive disorder), recurrent, in partial remission ([place]) 10/30/2019  . Tobacco use disorder 07/02/2019  . Right upper quadrant pain   . Encounter for other preprocedural examination 01/20/2019  . PTSD (post-traumatic stress disorder) 01/20/2019  . MDD (major depressive disorder), recurrent episode, moderate ([place]) 01/20/2019  . Insomnia due to mental disorder 01/20/2019  . Calculus of gallbladder with acute on chronic cholecystitis without obstruction 11/15/2018  . Generalized abdominal pain 10/08/2018  . Local superficial swelling, mass or lump 10/08/2018  . Enterocolitis 08/16/2018  . Left upper quadrant abdominal pain of unknown etiology 08/16/2018  . Diastasis recti 06/19/2018  . Generalized anxiety disorder 04/01/2018  . Clotted blood in stool 12/31/2017  .  Diarrhea 12/31/2017  . Encounter for general adult medical examination with abnormal findings 10/27/2017  . Uncontrolled type 2 diabetes mellitus with hyperglycemia ([place]) 10/27/2017  . Onychomycosis with ingrown toenail 10/27/2017  . Dysuria 10/27/2017  . Uncontrolled type 2 diabetes mellitus with hypoglycemia without coma ([place]) 07/08/2017  . Shortness of breath 07/08/2017  . Cigarette nicotine dependence without complication 51/76/1607  . Influenza A 05/30/2017  . OSA on CPAP 08/12/2015  . Cervical spondylosis 06/14/2015  . Essential hypertension 04/08/2015  . Depression 04/08/2015  . Gastroesophageal reflux disease without esophagitis 01/05/2015  . 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  Conditions to be addressed/monitored:  HTN, GERD, Type II DM w/neuropathy, Depression, Tobacco use disorder  Care Plan : General Pharmacy (Adult)  Updates made by Edythe Clarity, RPH since 08/17/2020 12:00 AM    Problem: HTN, HLD, DM, GERD. Tobacco use   Priority: High  Onset Date: 08/17/2020    Long-Range Goal: Patient-Specific Goal   Start Date: 08/17/2020  Expected End Date: 02/16/2021  This Visit's Progress: On track  Priority: High  Note:   Current Barriers:  . Unable to achieve control of glucose.  . Does not adhere to prescribed medication regimen  Pharmacist Clinical Goal(s):  Marland Kitchen Patient will achieve control of glucose as evidenced by A1c testing . adhere to plan to optimize therapeutic regimen for diabetes as evidenced by report of adherence to recommended medication management changes . adhere to prescribed medication regimen as evidenced by fill dates. through collaboration with PharmD and provider.   Interventions: . 1:1 collaboration with Lavera Guise, MD regarding development and update of comprehensive plan  of care as evidenced by provider attestation and  co-signature . Inter-disciplinary care team collaboration (see longitudinal plan of care) . Comprehensive medication review performed; medication list updated in electronic medical record  Hypertension (BP goal <130/80) -Controlled -Current treatment: . Metoprolol tartrate 191m BID -Medications previously tried: none noted -Current home readings: patient reports most are around 130/80.  Did not have specific logs with him today -Current dietary habits: now using his air fryer a lot more.  He does not fry in butter or oil as much as he used to.  Trying to avoid carbohydrates as much as possible.  He does report sodas are his weakness.  Usually drinks about 5 to 6 sodas per day. -Current exercise habits: will go outside and walk around with his dogs 4 to 5 times per day.  Used to have a membership to [employer] has not been back since [place] -Denies hypotensive/hypertensive symptoms -Educated on BP goals and benefits of medications for prevention of heart attack, stroke and kidney damage; Daily salt intake goal < 2300 mg; Exercise goal of 150 minutes per week; Importance of home blood pressure monitoring; -Counseled to monitor BP at home daily, document, and provide log at future appointments -Recommended to continue current medication Recommended walking plan for 15-20 minutes per day, either outside or back in the gym.  Diabetes (A1c goal <8%) - initial goal.  Would eventually like to see him < 7. -Uncontrolled -Current medications: . Janumet 50-5074mtwice daily . Toujeo 70 units into the skin hs . Humalog 8-10 units per meal as sliding scale -Medications previously tried: FaIran-Current home glucose readings . fasting glucose: did not bring logs but reports all his readings have been "high" lately . post prandial glucose:  -Reports hypoglycemic/hyperglycemic symptoms - numbess in feet, he sometimes cannot feel his dogs nibbling at his toes -Current meal patterns: SODAS 5 to  6 per day, trying to limit breads with his meats, using air fryer vs eating regular fried foods -Current exercise: walking dogs -Educated on A1c and blood sugar goals; Exercise goal of 150 minutes per week; Benefits of weight loss; Benefits of routine self-monitoring of blood sugar; Continuous glucose monitoring;  -He reports that his blood sugar was really well controlled when he was using the trial of Freestyle libre.  Has been having issues getting this approved and has seen his blood sugar control decline as he often does not prick his finger while he is out and this means he does not take his sliding scale injection. -Also unsure if he should still be taking FaIranhe reports someone took him off of it but I cannot find documentation of that. -Counseled to check feet daily and get yearly eye exams -Recommended to continue current medication Collaborate with PCP to determine if patient should still be taking FaIran Will work with DME suppliers to get patient [employer] he qualifies based on number of injections.  Really encouraged patient to find replacement for sodas or at least limit use.  Monitoring plan initiated moving forward. 20 Minutes of documentation spent on application to [employer] for DME supplies and [employer]/sensors.  Tobacco use (Goal Smoking cessation) -Uncontrolled -Previous quit attempts: none -Current treatment  . Bupropoion [place] 15052mID  -Patient triggers include: boredom  -Patient smoking 1.5 ppd currently.  He wants to quit but has not had luck with patch or gum.  Is allergic to adhesive on patch. -Recommended hard candies, straws, etc to use as  a replacelement.  GERD (Goal: Minimize symptoms) -Controlled -Current treatment  . Pantoprazole 82m daily . Famotidine 237mBID -Medications previously tried: none noted -Recently discovered he has hiatal hernia. -Denies any symptoms except for when he lays down at night.    -Recommended to continue current medication Recommended elevat head of the bed when he lays down.   Patient Goals/Self-Care Activities . Patient will:  - take medications as prescribed focus on medication adherence by pill count check glucose daily, document, and provide at future appointments check blood pressure daily, document, and provide at future appointments engage in dietary modifications by limiting soda intake  Follow Up Plan: The care management team will reach out to the patient again over the next 90 days.         Medication Assistance: None required.  Patient affirms current coverage meets needs.  [facility]one: [phone]: [phone] pill box? Yes Pt endorses 90% compliance  We discussed: Benefits of medication synchronization, packaging and delivery as well as enhanced pharmacist oversight with [employer]. Patient decided to: Continue current medication management strategy  Care Plan and Follow Up Patient Decision:  Patient agrees to Care Plan and Follow-up.  Plan: The care management team will reach out to the patient again over the next 90 days.  ChBeverly MilchPharmD Clinical Pharmacist NoPacific Orange Hospital, [phone]

## 2020-08-09 ENCOUNTER — Other Ambulatory Visit: Payer: Self-pay

## 2020-08-09 ENCOUNTER — Encounter: Payer: Self-pay | Admitting: Gastroenterology

## 2020-08-09 ENCOUNTER — Ambulatory Visit: Payer: Medicare Other | Admitting: Gastroenterology

## 2020-08-09 VITALS — BP 151/91 | HR 76 | Temp 97.8°F | Ht 69.0 in | Wt 234.4 lb

## 2020-08-09 DIAGNOSIS — Z86010 Personal history of colonic polyps: Secondary | ICD-10-CM

## 2020-08-09 DIAGNOSIS — K760 Fatty (change of) liver, not elsewhere classified: Secondary | ICD-10-CM

## 2020-08-09 DIAGNOSIS — K625 Hemorrhage of anus and rectum: Secondary | ICD-10-CM | POA: Diagnosis not present

## 2020-08-09 DIAGNOSIS — R197 Diarrhea, unspecified: Secondary | ICD-10-CM | POA: Diagnosis not present

## 2020-08-09 DIAGNOSIS — K2270 Barrett's esophagus without dysplasia: Secondary | ICD-10-CM

## 2020-08-09 MED ORDER — COLESTIPOL HCL 1 G PO TABS: 2.0000 g | ORAL_TABLET | Freq: Every day | ORAL | 2 refills | Status: DC

## 2020-08-09 MED ORDER — NA SULFATE-K SULFATE-MG SULF 17.5-3.13-1.6 GM/177ML PO SOLN: ORAL | 0 refills | Status: DC

## 2020-08-09 NOTE — Progress Notes (Signed)
[name], MD [address]  [place]  [place], [place] [address]  Main: [phone]  Fax: [phone]   Primary Care Physician: Lavera Guise, MD   Chief Complaint  Patient presents with  . Gastroesophageal Reflux  . Abdominal Pain    HPI: Raymond [name] is a 55 y.o. male with history of rectal bleeding from internal hemorrhoids, which resolved after banding with Dr. Marius Ditch, presents for follow-up.  Patient states record he has noted red blood in tissue paper again recently.  However, states he has anywhere from 1-3 bowel movements a day, that are usually loose since his cholecystectomy.  He states he notices the blood on the days where he has 3 bowel movements a day.  No nausea or vomiting.  Is taking combination of PPI and H2 RA and continues to report breakthrough symptoms despite this.  No dysphagia.  No reflux.  Previous work-up: Patient has had previous work-up with EGD and colonoscopy in 2017 at [facility]. At that time he reported hematochezia as well. 4 subcentimeter polyps were removed and were hyperplastic. Internal nonbleeding hemorrhoids were reported. EGD showed grade B reflux esophagitis. Esophageal mucosal changes suspicious for Barrett's were also reported. Pathology report showed no intestinal metaplasia. 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  Also has history of fatty liver  Current Outpatient Medications  Medication Sig Dispense Refill  . ACCU-CHEK FASTCLIX LANCETS MISC yse as directed twice a day 100 each 1  . albuterol (PROAIR HFA) 108 (90 Base) MCG/ACT inhaler Inhale 2-4 puffs by mouth Every 4-6 hours as needed for wheezing, cough, and/or shortness of breath 3 Inhaler 1  . aspirin EC 81 MG tablet Take 81 mg by mouth daily at 3 pm.     . atorvastatin (LIPITOR) 10 MG tablet Take 1 tablet (10 mg total) by mouth daily. 90 tablet 1  . buPROPion (WELLBUTRIN SR) 150 MG 12 hr tablet Take 1 tablet (150 mg total) by mouth  daily. 30 tablet 1  . colestipol (COLESTID) 1 g tablet Take 2 tablets (2 g total) by mouth daily for 30 doses. 60 tablet 2  . Continuous Blood Gluc Receiver (FREESTYLE LIBRE 2 READER) DEVI Use as directed to read blood sugar 4 times a day  Dx E11.65 1 each 5  . Continuous Blood Gluc Sensor (FREESTYLE LIBRE 14 DAY SENSOR) MISC 1 each by Does not apply route every 14 (fourteen) days. Apply to skin every 14 days E11.65 6 each 12  . dapagliflozin propanediol (FARXIGA) 10 MG TABS tablet Take 1 tablet (10 mg total) by mouth daily. 30 tablet 0  . DULoxetine (CYMBALTA) 60 MG capsule Take 1 capsule (60 mg total) by mouth daily. 30 capsule 1  . famotidine (PEPCID) 20 MG tablet Take 1 tablet (20 mg total) by mouth 2 (two) times daily. 180 tablet 1  . glucose blood (ACCU-CHEK AVIVA PLUS) test strip Blood sugar testing TID and as needed. E11.65 100 each 12  . insulin glargine, 2 Unit Dial, (TOUJEO MAX SOLOSTAR) 300 UNIT/ML Solostar Pen Inject 70 Units into the skin daily. Patient to increase dose to 70 units at bedtime 10 mL 3  . insulin lispro (HUMALOG KWIKPEN) 100 UNIT/ML KwikPen Use as directed per  sliding scale max units 8-10 per meal (Patient taking differently: Inject 12-15 Units into the skin 3 (three) times daily between meals. Sliding Scale Insulin) 15 mL 2  . Insulin Pen Needle 32G X 4 MM MISC Use with daily dose insulin  and with sliding scale insulin as needed. 100 each 5  . metoprolol tartrate (LOPRESSOR) 100 MG tablet Take 1 tablet (100 mg total) by mouth 2 (two) times daily. 180 tablet 1  . mometasone-formoterol (DULERA) 100-5 MCG/ACT AERO Inhale 2 puffs into the lungs 2 (two) times daily. 13 g 2  . nitroGLYCERIN (NITROSTAT) 0.4 MG SL tablet Place 1 tablet (0.4 mg total) under the tongue every 5 (five) minutes x 3 doses as needed for chest pain. 30 tablet 1  . pantoprazole (PROTONIX) 40 MG tablet Take 1 tablet (40 mg total) by mouth daily. 90 tablet 1  . sitaGLIPtin-metformin (JANUMET) 50-500 MG  tablet Take 1 tablet by mouth 2 (two) times daily. 180 tablet 1  . temazepam (RESTORIL) 30 MG capsule TAKE 1 CAPSULE BY MOUTH AT BEDTIME AS NEEDED FOR SLEEP 7 capsule 0  . hydrOXYzine (ATARAX/VISTARIL) 10 MG tablet Take 1-2 tablets (10-20 mg total) by mouth at bedtime as needed. FOR SLEEP (Patient not taking: Reported on 08/09/2020) 30 tablet 2  . Na Sulfate-K Sulfate-Mg Sulf 17.5-3.13-1.6 GM/177ML SOLN At 5 PM the day before procedure take 1 bottle and 5 hours before procedure take 1 bottle. 354 mL 0  . nicotine polacrilex (NICORETTE) 4 MG gum Take 1 each (4 mg total) by mouth as needed for smoking cessation. (Patient not taking: Reported on 08/09/2020) 100 tablet 0   No current facility-administered medications for this visit.    Allergies as of 08/09/2020 - Review Complete 08/09/2020  Allergen Reaction Noted  . Onion Anaphylaxis 03/30/2015  . Hydrocodone  01/13/2014  . Norco [hydrocodone-acetaminophen] Itching 11/02/2014  . Hydrocodone-acetaminophen Itching 11/02/2014  . Nickel Rash 01/20/2019    ROS:  General: Negative for anorexia, weight loss, fever, chills, fatigue, weakness. ENT: Negative for hoarseness, difficulty swallowing , nasal congestion. CV: Negative for chest pain, angina, palpitations, dyspnea on exertion, peripheral edema.  Respiratory: Negative for dyspnea at rest, dyspnea on exertion, cough, sputum, wheezing.  GI: See history of present illness. GU:  Negative for dysuria, hematuria, urinary incontinence, urinary frequency, nocturnal urination.  Endo: Negative for unusual weight change.    Physical Examination:   BP (!) 151/91   Pulse 76   Temp 97.8 F (36.6 C) (Oral)   Ht 5\' 9"  (1.753 m)   Wt 234 lb 6.4 oz (106.3 kg)   BMI 34.61 kg/m   General: Well-nourished, well-developed in no acute distress.  Eyes: No icterus. Conjunctivae pink. Mouth: Oropharyngeal mucosa moist and pink , no lesions erythema or exudate. Neck: Supple, Trachea midline Abdomen: Bowel  sounds are normal, nontender, nondistended, no hepatosplenomegaly or masses, no abdominal bruits or hernia , no rebound or guarding.   Extremities: No lower extremity edema. No clubbing or deformities. Neuro: Alert and oriented x 3.  Grossly intact. Skin: Warm and dry, no jaundice.   Psych: Alert and cooperative, normal mood and affect.   Labs: CMP     Component Value Date/Time   NA 133 (L) 03/09/2020 1248   NA 138 11/13/2018 1442   NA 138 11/28/2013 1506   K 4.1 03/09/2020 1248   K 3.7 11/28/2013 1506   CL 98 03/09/2020 1248   CL 105 11/28/2013 1506   CO2 21 (L) 03/09/2020 1248   CO2 26 11/28/2013 1506   GLUCOSE 291 (H) 03/09/2020 1248   GLUCOSE 163 (H) 11/28/2013 1506   BUN 14 03/09/2020 1248   BUN 9 11/13/2018 1442   BUN 12 11/28/2013 1506   CREATININE 1.05 03/09/2020 1248   CREATININE  1.25 11/28/2013 1506   CALCIUM 9.5 03/09/2020 1248   CALCIUM 8.5 11/28/2013 1506   PROT 8.5 (H) 03/09/2020 1248   PROT 7.6 11/13/2018 1442   PROT 8.0 12/08/2012 0914   ALBUMIN 4.1 03/09/2020 1248   ALBUMIN 4.4 11/13/2018 1442   ALBUMIN 4.1 12/08/2012 0914   AST 28 03/09/2020 1248   AST 28 12/08/2012 0914   ALT 26 03/09/2020 1248   ALT 42 12/08/2012 0914   ALKPHOS 109 03/09/2020 1248   ALKPHOS 118 12/08/2012 0914   BILITOT 0.6 03/09/2020 1248   BILITOT <0.2 11/13/2018 1442   BILITOT 0.3 12/08/2012 0914   GFRNONAA >60 03/09/2020 1248   GFRNONAA >60 11/28/2013 1506   GFRAA >60 01/25/2019 0623   GFRAA >60 11/28/2013 1506   Lab Results  Component Value Date   WBC 11.9 (H) 01/25/2019   HGB 14.7 01/25/2019   HCT 43.1 01/25/2019   MCV 89.8 01/25/2019   PLT 189 01/25/2019    Imaging Studies: No results found.  Assessment and Plan:   Raymond [name] is a 55 y.o. y/o male previously seen for rectal bleeding, which resolved after in clinic hemorrhoid banding, with recurrence of symptoms, but denies any constipation, and rather reporting bright red blood on tissue paper, after  loose bowel movements since his cholecystectomy  Start Colestid  Patient due for polyp surveillance  With ongoing symptoms of breakthrough reflux despite medications and previously noted grade B esophagitis further evaluation with EGD indicated  Patient educated extensively on acid reflux lifestyle modification, including buying a bed wedge, not eating 3 hrs before bedtime, diet modifications, and handout given for the same.   I have discussed alternative options, risks & benefits,  which include, but are not limited to, bleeding, infection, perforation,respiratory complication & drug reaction.  The patient agrees with this plan & written consent will be obtained.    Finding of fatty liver on imaging discussed with patient Diet, weight loss, and exercise encouraged along with avoiding hepatotoxic drugs including alcohol Risk of progression to cirrhosis if above measures are not instituted were discussed as well, and patient verbalized understanding  Most recent liver enzymes are normal    Dr [name]

## 2020-08-10 ENCOUNTER — Telehealth: Payer: Self-pay

## 2020-08-10 DIAGNOSIS — F5105 Insomnia due to other mental disorder: Secondary | ICD-10-CM

## 2020-08-10 MED ORDER — TEMAZEPAM 30 MG PO CAPS: 30.0000 mg | ORAL_CAPSULE | Freq: Every day | ORAL | 2 refills | Status: DC

## 2020-08-10 NOTE — Telephone Encounter (Signed)
I have sent temazepam to pharmacy.

## 2020-08-10 NOTE — Telephone Encounter (Signed)
pt needs a refill on his temazepam, he states he has two left.

## 2020-08-11 ENCOUNTER — Ambulatory Visit: Payer: Medicare Other | Admitting: Internal Medicine

## 2020-08-11 ENCOUNTER — Other Ambulatory Visit: Payer: Self-pay

## 2020-08-11 ENCOUNTER — Inpatient Hospital Stay: Payer: Medicare Other | Attending: Oncology | Admitting: Oncology

## 2020-08-11 ENCOUNTER — Ambulatory Visit
Admission: RE | Admit: 2020-08-11 | Discharge: 2020-08-11 | Disposition: A | Discharge status: Home / Self Care | Payer: Medicare Other | Source: Ambulatory Visit | Attending: Oncology | Admitting: Oncology

## 2020-08-11 DIAGNOSIS — R0602 Shortness of breath: Secondary | ICD-10-CM | POA: Diagnosis not present

## 2020-08-11 DIAGNOSIS — F17210 Nicotine dependence, cigarettes, uncomplicated: Secondary | ICD-10-CM | POA: Diagnosis not present

## 2020-08-11 DIAGNOSIS — F17200 Nicotine dependence, unspecified, uncomplicated: Secondary | ICD-10-CM | POA: Diagnosis not present

## 2020-08-11 DIAGNOSIS — Z122 Encounter for screening for malignant neoplasm of respiratory organs: Secondary | ICD-10-CM | POA: Diagnosis not present

## 2020-08-11 DIAGNOSIS — Z87891 Personal history of nicotine dependence: Secondary | ICD-10-CM

## 2020-08-11 DIAGNOSIS — F17219 Nicotine dependence, cigarettes, with unspecified nicotine-induced disorders: Secondary | ICD-10-CM

## 2020-08-11 LAB — PULMONARY FUNCTION TEST

## 2020-08-11 NOTE — Progress Notes (Signed)
Virtual Visit via Video Note  I connected with Mr. Kissick on 08/11/20 at 10:45 AM EDT by a video enabled telemedicine application and verified that I am speaking with the correct person using two identifiers.  Location: Patient: [facility] Provider: Clinic    I discussed the limitations of evaluation and management by telemedicine and the availability of in person appointments. The patient expressed understanding and agreed to proceed.  I discussed the assessment and treatment plan with the patient. The patient was provided an opportunity to ask questions and all were answered. The patient agreed with the plan and demonstrated an understanding of the instructions.   The patient was advised to call back or seek an in-person evaluation if the symptoms worsen or if the condition fails to improve as anticipated.   In accordance with CMS guidelines, patient has met eligibility criteria including age, absence of signs or symptoms of lung cancer.  Social History   Tobacco Use  . Smoking status: Current Every Day Smoker    Packs/day: 1.00    Years: 40.00    Pack years: 40.00    Types: Cigarettes  . Smokeless tobacco: Former Systems developer    Types: Chew  . Tobacco comment: 1 pack per day reported 04/07/2020  Vaping Use  . Vaping Use: Former  Substance Use Topics  . Alcohol use: No    Alcohol/week: 0.0 standard drinks  . Drug use: No      A shared decision-making session was conducted prior to the performance of CT scan. This includes one or more decision aids, includes benefits and harms of screening, follow-up diagnostic testing, over-diagnosis, false positive rate, and total radiation exposure.   Counseling on the importance of adherence to annual lung cancer LDCT screening, impact of co-morbidities, and ability or willingness to undergo diagnosis and treatment is imperative for compliance of the program.   Counseling on the importance of continued smoking cessation for former smokers; the  importance of smoking cessation for current smokers, and information about tobacco cessation interventions have been given to patient including [place] and [facility] quit [place] programs.   Written order for lung cancer screening with LDCT has been given to the patient and any and all questions have been answered to the best of my abilities.    Yearly follow up will be coordinated by Burgess Estelle, Thoracic Navigator.  I provided 15 minutes of face-to-face video visit time during this encounter, and > 50% was spent counseling as documented under my assessment & plan.   Jacquelin Hawking, NP

## 2020-08-12 ENCOUNTER — Telehealth: Payer: Self-pay | Admitting: Pharmacist

## 2020-08-12 NOTE — Progress Notes (Addendum)
Chronic Care Management Pharmacy Assistant   Name: Raymond [name]  MRN: [id number] DOB: 01/12/1966  Reason for Encounter: Initial Questions    Medications: Outpatient Encounter Medications as of 08/12/2020  Medication Sig Note   ACCU-CHEK FASTCLIX LANCETS MISC yse as directed twice a day    albuterol (PROAIR HFA) 108 (90 Base) MCG/ACT inhaler Inhale 2-4 puffs by mouth Every 4-6 hours as needed for wheezing, cough, and/or shortness of breath    aspirin EC 81 MG tablet Take 81 mg by mouth daily at 3 pm.     atorvastatin (LIPITOR) 10 MG tablet Take 1 tablet (10 mg total) by mouth daily.    buPROPion (WELLBUTRIN SR) 150 MG 12 hr tablet Take 1 tablet (150 mg total) by mouth daily.    colestipol (COLESTID) 1 g tablet Take 2 tablets (2 g total) by mouth daily for 30 doses.    Continuous Blood Gluc Receiver (FREESTYLE LIBRE 2 READER) DEVI Use as directed to read blood sugar 4 times a day  Dx E11.65    Continuous Blood Gluc Sensor (FREESTYLE LIBRE 14 DAY SENSOR) MISC 1 each by Does not apply route every 14 (fourteen) days. Apply to skin every 14 days E11.65    dapagliflozin propanediol (FARXIGA) 10 MG TABS tablet Take 1 tablet (10 mg total) by mouth daily.    DULoxetine (CYMBALTA) 60 MG capsule Take 1 capsule (60 mg total) by mouth daily.    famotidine (PEPCID) 20 MG tablet Take 1 tablet (20 mg total) by mouth 2 (two) times daily.    glucose blood (ACCU-CHEK AVIVA PLUS) test strip Blood sugar testing TID and as needed. E11.65    hydrOXYzine (ATARAX/VISTARIL) 10 MG tablet Take 1-2 tablets (10-20 mg total) by mouth at bedtime as needed. FOR SLEEP (Patient not taking: Reported on 08/09/2020)    insulin glargine, 2 Unit Dial, (TOUJEO MAX SOLOSTAR) 300 UNIT/ML Solostar Pen Inject 70 Units into the skin daily. Patient to increase dose to 70 units at bedtime    insulin lispro (HUMALOG KWIKPEN) 100 UNIT/ML KwikPen Use as directed per  sliding scale max units 8-10 per meal (Patient taking differently:  Inject 12-15 Units into the skin 3 (three) times daily between meals. Sliding Scale Insulin)    Insulin Pen Needle 32G X 4 MM MISC Use with daily dose insulin and with sliding scale insulin as needed.    metoprolol tartrate (LOPRESSOR) 100 MG tablet Take 1 tablet (100 mg total) by mouth 2 (two) times daily.    mometasone-formoterol (DULERA) 100-5 MCG/ACT AERO Inhale 2 puffs into the lungs 2 (two) times daily.    Na Sulfate-K Sulfate-Mg Sulf 17.5-3.13-1.6 GM/177ML SOLN At 5 PM the day before procedure take 1 bottle and 5 hours before procedure take 1 bottle.    nicotine polacrilex (NICORETTE) 4 MG gum Take 1 each (4 mg total) by mouth as needed for smoking cessation. (Patient not taking: Reported on 08/09/2020) 08/09/2020: Not taking due to cost of rx.     nitroGLYCERIN (NITROSTAT) 0.4 MG SL tablet Place 1 tablet (0.4 mg total) under the tongue every 5 (five) minutes x 3 doses as needed for chest pain.    pantoprazole (PROTONIX) 40 MG tablet Take 1 tablet (40 mg total) by mouth daily.    sitaGLIPtin-metformin (JANUMET) 50-500 MG tablet Take 1 tablet by mouth 2 (two) times daily.    temazepam (RESTORIL) 30 MG capsule Take 1 capsule (30 mg total) by mouth at bedtime.    No facility-administered encounter medications  on file as of 08/12/2020.    Have you seen any other providers since your last visit? Patient stated he has recently seen his gastrologist and Oncology.  Any changes in your medications or health? Patient stated he got his gallbladder has been removed and a recent LDCT lung cancer with new findings.   Any side effects from any medications? Patient stated no.  Do you have an symptoms or problems not managed by your medications? Patient stated his acid reflux and shortness of breath.  Any concerns about your health right now? Patient stated his shortness of breath.   Has your provider asked that you check blood pressure, blood sugar, or follow special diet at home? Patient stated he has  been asked to monitor blood pressure, blood sugar, and to follow a special diet at home. He stated he checks his blood sugar 3 times a day and insulin shots 4 times day, he checks his blood pressure two times daily and watches his food intake at home.  Do you get any type of exercise on a regular basis? Patient stated no.  Can you think of a goal you would like to reach for your health? Patient stated to breathe better.   Do you have any problems getting your medications? Patient stated no.  Is there anything that you would like to discuss during the appointment? Patient stated no.  Please bring medications and supplements to appointment, patient reminded of face to face appointment on 4/26 at 9 am.   Star Rating Drugs: Atorvastatin 10 mg 90 DS 06/06/20   Follow-Up:Pharmacist Review  Charlann Lange, RMA Clinical Pharmacist Assistant [phone]  7 minutes spent in review, coordination, and documentation.  Reviewed by: Beverly Milch, PharmD Clinical Pharmacist [facility] [phone]

## 2020-08-16 ENCOUNTER — Encounter: Payer: Self-pay | Admitting: Psychiatry

## 2020-08-16 ENCOUNTER — Other Ambulatory Visit: Payer: Self-pay

## 2020-08-16 ENCOUNTER — Telehealth: Payer: Self-pay | Admitting: *Deleted

## 2020-08-16 ENCOUNTER — Telehealth (INDEPENDENT_AMBULATORY_CARE_PROVIDER_SITE_OTHER): Payer: Medicare Other | Admitting: Psychiatry

## 2020-08-16 DIAGNOSIS — F17200 Nicotine dependence, unspecified, uncomplicated: Secondary | ICD-10-CM

## 2020-08-16 DIAGNOSIS — F5105 Insomnia due to other mental disorder: Secondary | ICD-10-CM

## 2020-08-16 DIAGNOSIS — F3342 Major depressive disorder, recurrent, in full remission: Secondary | ICD-10-CM | POA: Diagnosis not present

## 2020-08-16 DIAGNOSIS — F4310 Post-traumatic stress disorder, unspecified: Secondary | ICD-10-CM

## 2020-08-16 MED ORDER — DULOXETINE HCL 60 MG PO CPEP: 60.0000 mg | ORAL_CAPSULE | Freq: Every day | ORAL | 1 refills | Status: DC

## 2020-08-16 MED ORDER — HYDROXYZINE HCL 10 MG PO TABS: 10.0000 mg | ORAL_TABLET | Freq: Every evening | ORAL | 2 refills | Status: DC | PRN

## 2020-08-16 MED ORDER — BUPROPION HCL ER (SR) 150 MG PO TB12: 150.0000 mg | ORAL_TABLET | Freq: Two times a day (BID) | ORAL | 1 refills | Status: DC

## 2020-08-16 NOTE — Patient Instructions (Signed)
Bupropion sustained-release tablets (Depression/Mood Disorders) What is this medicine? BUPROPION (byoo PROE pee on) is used to treat depression. This medicine may be used for other purposes; ask your health care provider or pharmacist if you have questions. COMMON BRAND NAME(S): Budeprion SR, Wellbutrin SR What should I tell my health care provider before I take this medicine? They need to know if you have any of these conditions:  an eating disorder, such as anorexia or bulimia  bipolar disorder or psychosis  diabetes or high blood sugar, treated with medication  glaucoma  head injury or brain tumor  heart disease, previous heart attack, or irregular heart beat  high blood pressure  kidney or liver disease  seizures  suicidal thoughts or a previous suicide attempt  Tourette's syndrome  weight loss  an unusual or allergic reaction to bupropion, other medicines, foods, dyes, or preservatives  breast-feeding  pregnant or trying to become pregnant How should I use this medicine? Take this medicine by mouth with a glass of water. Follow the directions on the prescription label. You can take it with or without food. If it upsets your stomach, take it with food. Do not cut, crush or chew this medicine. Take your medicine at regular intervals. If you take this medicine more than once a day, take your second dose at least 8 hours after you take your first dose. To limit difficulty in sleeping, avoid taking this medicine at bedtime. Do not take your medicine more often than directed. Do not stop taking this medicine suddenly except upon the advice of your doctor. Stopping this medicine too quickly may cause serious side effects or your condition may worsen. A special MedGuide will be given to you by the pharmacist with each prescription and refill. Be sure to read this information carefully each time. Talk to your pediatrician regarding the use of this medicine in children. Special  care may be needed. Overdosage: If you think you have taken too much of this medicine contact a poison control center or emergency room at once. NOTE: This medicine is only for you. Do not share this medicine with others. What if I miss a dose? If you miss a dose, skip the missed dose and take your next tablet at the regular time. There should be at least 8 hours between doses. Do not take double or extra doses. What may interact with this medicine? Do not take this medicine with any of the following medications:  linezolid  MAOIs like Azilect, Carbex, Eldepryl, Marplan, Nardil, and Parnate  methylene blue (injected into a vein)  other medicines that contain bupropion like Zyban This medicine may also interact with the following medications:  alcohol  certain medicines for anxiety or sleep  certain medicines for blood pressure like metoprolol, propranolol  certain medicines for depression or psychotic disturbances  certain medicines for HIV or AIDS like efavirenz, lopinavir, nelfinavir, ritonavir  certain medicines for irregular heart beat like propafenone, flecainide  certain medicines for Parkinson's disease like amantadine, levodopa  certain medicines for seizures like carbamazepine, phenytoin, phenobarbital  cimetidine  clopidogrel  cyclophosphamide  digoxin  furazolidone  isoniazid  nicotine  orphenadrine  procarbazine  steroid medicines like prednisone or cortisone  stimulant medicines for attention disorders, weight loss, or to stay awake  tamoxifen  theophylline  thiotepa  ticlopidine  tramadol  warfarin This list may not describe all possible interactions. Give your health care provider a list of all the medicines, herbs, non-prescription drugs, or dietary supplements you use. Also  tell them if you smoke, drink alcohol, or use illegal drugs. Some items may interact with your medicine. What should I watch for while using this medicine? Tell  your doctor if your symptoms do not get better or if they get worse. Visit your doctor or healthcare provider for regular checks on your progress. Because it may take several weeks to see the full effects of this medicine, it is important to continue your treatment as prescribed by your doctor. This medicine may cause serious skin reactions. They can happen weeks to months after starting the medicine. Contact your healthcare provider right away if you notice fevers or flu-like symptoms with a rash. The rash may be red or purple and then turn into blisters or peeling of the skin. Or, you might notice a red rash with swelling of the face, lips or lymph nodes in your neck or under your arms. Patients and their families should watch out for new or worsening thoughts of suicide or depression. Also watch out for sudden changes in feelings such as feeling anxious, agitated, panicky, irritable, hostile, aggressive, impulsive, severely restless, overly excited and hyperactive, or not being able to sleep. If this happens, especially at the beginning of treatment or after a change in dose, call your healthcare provider. Avoid alcoholic drinks while taking this medicine. Drinking excessive alcoholic beverages, using sleeping or anxiety medicines, or quickly stopping the use of these agents while taking this medicine may increase your risk for a seizure. Do not drive or use heavy machinery until you know how this medicine affects you. This medicine can impair your ability to perform these tasks. Do not take this medicine close to bedtime. It may prevent you from sleeping. Your mouth may get dry. Chewing sugarless gum or sucking hard candy, and drinking plenty of water may help. Contact your doctor if the problem does not go away or is severe. What side effects may I notice from receiving this medicine? Side effects that you should report to your doctor or health care professional as soon as possible:  allergic  reactions like skin rash, itching or hives, swelling of the face, lips, or tongue  breathing problems  changes in vision  confusion  elevated mood, decreased need for sleep, racing thoughts, impulsive behavior  fast or irregular heartbeat  hallucinations, loss of contact with reality  increased blood pressure  rash, fever, and swollen lymph nodes  redness, blistering, peeling or loosening of the skin, including inside the mouth  seizures  suicidal thoughts or other mood changes  unusually weak or tired  vomiting Side effects that usually do not require medical attention (report to your doctor or health care professional if they continue or are bothersome):  constipation  headache  loss of appetite  nausea  tremors  weight loss This list may not describe all possible side effects. Call your doctor for medical advice about side effects. You may report side effects to FDA at 1-800-FDA-1088. Where should I keep my medicine? Keep out of the reach of children. Store at room temperature between 20 and 25 degrees C (68 and 77 degrees F), away from direct sunlight and moisture. Keep tightly closed. Throw away any unused medicine after the expiration date. NOTE: This sheet is a summary. It may not cover all possible information. If you have questions about this medicine, talk to your doctor, pharmacist, or health care provider.  2021 [employer] (2018-07-04 13:55:14) Bupropion tablets (Depression/Mood Disorders) What is this medicine? BUPROPION (byoo PROE pee on)  is used to treat depression. This medicine may be used for other purposes; ask your health care provider or pharmacist if you have questions. COMMON BRAND NAME(S): Wellbutrin What should I tell my health care provider before I take this medicine? They need to know if you have any of these conditions:  an eating disorder, such as anorexia or bulimia  bipolar disorder or psychosis  diabetes or high  blood sugar, treated with medication  glaucoma  heart disease, previous heart attack, or irregular heart beat  head injury or brain tumor  high blood pressure  kidney or liver disease  seizures  suicidal thoughts or a previous suicide attempt  Tourette's syndrome  weight loss  an unusual or allergic reaction to bupropion, other medicines, foods, dyes, or preservatives  breast-feeding  pregnant or trying to become pregnant How should I use this medicine? Take this medicine by mouth with a glass of water. Follow the directions on the prescription label. You can take it with or without food. If it upsets your stomach, take it with food. Take your medicine at regular intervals. Do not take your medicine more often than directed. Do not stop taking this medicine suddenly except upon the advice of your doctor. Stopping this medicine too quickly may cause serious side effects or your condition may worsen. A special MedGuide will be given to you by the pharmacist with each prescription and refill. Be sure to read this information carefully each time. Talk to your pediatrician regarding the use of this medicine in children. Special care may be needed. Overdosage: If you think you have taken too much of this medicine contact a poison control center or emergency room at once. NOTE: This medicine is only for you. Do not share this medicine with others. What if I miss a dose? If you miss a dose, take it as soon as you can. If it is less than four hours to your next dose, take only that dose and skip the missed dose. Do not take double or extra doses. What may interact with this medicine? Do not take this medicine with any of the following medications:  linezolid  MAOIs like Azilect, Carbex, Eldepryl, Marplan, Nardil, and Parnate  methylene blue (injected into a vein)  other medicines that contain bupropion like Zyban This medicine may also interact with the following  medications:  alcohol  certain medicines for anxiety or sleep  certain medicines for blood pressure like metoprolol, propranolol  certain medicines for depression or psychotic disturbances  certain medicines for HIV or AIDS like efavirenz, lopinavir, nelfinavir, ritonavir  certain medicines for irregular heart beat like propafenone, flecainide  certain medicines for Parkinson's disease like amantadine, levodopa  certain medicines for seizures like carbamazepine, phenytoin, phenobarbital  cimetidine  clopidogrel  cyclophosphamide  digoxin  furazolidone  isoniazid  nicotine  orphenadrine  procarbazine  steroid medicines like prednisone or cortisone  stimulant medicines for attention disorders, weight loss, or to stay awake  tamoxifen  theophylline  thiotepa  ticlopidine  tramadol  warfarin This list may not describe all possible interactions. Give your health care provider a list of all the medicines, herbs, non-prescription drugs, or dietary supplements you use. Also tell them if you smoke, drink alcohol, or use illegal drugs. Some items may interact with your medicine. What should I watch for while using this medicine? Tell your doctor if your symptoms do not get better or if they get worse. Visit your doctor or healthcare provider for regular checks on your progress.  Because it may take several weeks to see the full effects of this medicine, it is important to continue your treatment as prescribed by your doctor. This medicine may cause serious skin reactions. They can happen weeks to months after starting the medicine. Contact your healthcare provider right away if you notice fevers or flu-like symptoms with a rash. The rash may be red or purple and then turn into blisters or peeling of the skin. Or, you might notice a red rash with swelling of the face, lips or lymph nodes in your neck or under your arms. Patients and their families should watch out for new  or worsening thoughts of suicide or depression. Also watch out for sudden changes in feelings such as feeling anxious, agitated, panicky, irritable, hostile, aggressive, impulsive, severely restless, overly excited and hyperactive, or not being able to sleep. If this happens, especially at the beginning of treatment or after a change in dose, call your healthcare provider. Avoid alcoholic drinks while taking this medicine. Drinking excessive alcoholic beverages, using sleeping or anxiety medicines, or quickly stopping the use of these agents while taking this medicine may increase your risk for a seizure. Do not drive or use heavy machinery until you know how this medicine affects you. This medicine can impair your ability to perform these tasks. Do not take this medicine close to bedtime. It may prevent you from sleeping. Your mouth may get dry. Chewing sugarless gum or sucking hard candy, and drinking plenty of water may help. Contact your doctor if the problem does not go away or is severe. What side effects may I notice from receiving this medicine? Side effects that you should report to your doctor or health care professional as soon as possible:  allergic reactions like skin rash, itching or hives, swelling of the face, lips, or tongue  breathing problems  changes in vision  confusion  elevated mood, decreased need for sleep, racing thoughts, impulsive behavior  fast or irregular heartbeat  hallucinations, loss of contact with reality  increased blood pressure  rash, fever, and swollen lymph nodes  redness, blistering, peeling, or loosening of the skin, including inside the mouth  seizures  suicidal thoughts or other mood changes  unusually weak or tired  vomiting Side effects that usually do not require medical attention (report to your doctor or health care professional if they continue or are bothersome):  constipation  headache  loss of  appetite  nausea  tremors  weight loss This list may not describe all possible side effects. Call your doctor for medical advice about side effects. You may report side effects to FDA at 1-800-FDA-1088. Where should I keep my medicine? Keep out of the reach of children. Store at room temperature between 20 and 25 degrees C (68 and 77 degrees F), away from direct sunlight and moisture. Keep tightly closed. Throw away any unused medicine after the expiration date. NOTE: This sheet is a summary. It may not cover all possible information. If you have questions about this medicine, talk to your doctor, pharmacist, or health care provider.  2021 [employer] (2018-07-04 14:02:47)

## 2020-08-16 NOTE — Progress Notes (Signed)
Virtual Visit via Video Note  I connected with Raymond [name] on 08/16/20 at 10:00 AM EDT by a video enabled telemedicine application and verified that I am speaking with the correct person using two identifiers.  Location Provider Location : [facility] Patient Location : Home  Participants: Patient , Provider   I discussed the limitations of evaluation and management by telemedicine and the availability of in person appointments. The patient expressed understanding and agreed to proceed.    I discussed the assessment and treatment plan with the patient. The patient was provided an opportunity to ask questions and all were answered. The patient agreed with the plan and demonstrated an understanding of the instructions.   The patient was advised to call back or seek an in-person evaluation if the symptoms worsen or if the condition fails to improve as anticipated.   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  Patient today reports he is currently coping with his anxiety symptoms.  Overall he is doing well with regards to his mood on the current medication regimen.  He reports sleep is okay on the temazepam and he takes the hydroxyzine as needed which helps.  He denies side effects to medications.  Patient reports he continues to smoke and he does not believe the Wellbutrin at this dosage is helpful.  He is interested in dosage increase.  Patient denies any other concerns today.  Visit Diagnosis:    ICD-10-CM   1. MDD (major depressive disorder), recurrent, in full remission ([place])  F33.42 buPROPion (WELLBUTRIN SR) 150 MG  12 hr tablet    DULoxetine (CYMBALTA) 60 MG capsule  2. PTSD (post-traumatic stress disorder)  F43.10 DULoxetine (CYMBALTA) 60 MG capsule  3. Tobacco use disorder  F17.200 buPROPion (WELLBUTRIN SR) 150 MG 12 hr tablet  4. Insomnia due to mental disorder  F51.05 hydrOXYzine (ATARAX/VISTARIL) 10 MG tablet    Past Psychiatric History: I have reviewed past psychiatric history from progress note on 05/23/2018.  Past trials of Prozac, Zoloft, Celexa, Tegretol, doxepin, Klonopin, Seroquel, mirtazapine, Belsomra, Lunesta, Ativan, Rozerem, Lexapro  Past Medical History:  Past Medical History:  Diagnosis Date  . Anxiety   . Arthritis    NECK  . Bronchitis   . Depression   . Diabetes mellitus without complication ([place])   . 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 Dyspnea    DUE TO GALLBLADDER PER PT  . Family history of adverse reaction to anesthesia    N/V, TROUBLE BREATHING COMING OUT OF ANESTHESIA  . Fatty liver   . GERD (gastroesophageal reflux disease)   . Headache    MIGRAINES  . Hyperlipidemia   . Hypertension   . Irregular heart beat unk  . Myocardial infarction ([place]) 2010   MILD   . PTSD (post-traumatic stress disorder)   . Sleep apnea    USES CPAP  . Tachycardia     Past Surgical History:  Procedure Laterality Date  .  ingrown toenail removal    . CHOLECYSTECTOMY N/A 01/23/2019   Procedure: LAPAROSCOPIC CHOLECYSTECTOMY;  Surgeon: Olean Ree, MD;  Location: [facility] ORS;  Service: General;  Laterality: N/A;  . CLAVICLE SURGERY  Right   . COLONOSCOPY  2017  . MOUTH SURGERY     REMOVED ALL TEETH  . SHOULDER SURGERY Right   . UPPER GI ENDOSCOPY  2017    Family Psychiatric History: I have reviewed family psychiatric history from progress note on 05/23/2018  Family History:  Family History  Problem Relation Age of Onset  . Hypertension Mother   . Diabetes type II Mother   . Diabetes Mother   . COPD Father   . Cancer Father   . Heart disease Father   . Diabetes  Sister   . Anxiety disorder Sister   . Depression Sister   . Diabetes Brother   . Anxiety disorder Brother   . Depression Brother   . Diabetes Maternal Aunt   . Diabetes Sister   . Anxiety disorder Sister   . Depression Sister   . Diabetes Sister   . Anxiety disorder Sister   . Depression Sister   . Diabetes Sister   . Anxiety disorder Sister   . Depression Sister   . Colon cancer Maternal Uncle     Social History: Reviewed social history from progress note on 05/23/2018 Social History   Socioeconomic History  . Marital status: Divorced    Spouse name: Not on file  . Number of children: 4  . Years of education: Not on file  . Highest education level: 8th grade  Occupational History  . Not on file  Tobacco Use  . Smoking status: Current Every Day Smoker    Packs/day: 1.00    Years: 40.00    Pack years: 40.00    Types: Cigarettes  . Smokeless tobacco: Former Systems developer    Types: Chew  . Tobacco comment: 1 pack per day reported 04/07/2020  Vaping Use  . Vaping Use: Former  Substance and Sexual Activity  . Alcohol use: No    Alcohol/week: 0.0 standard drinks  . Drug use: No  . Sexual activity: Not Currently  Other Topics Concern  . Not on file  Social History Narrative  . Not on file   Social Determinants of Health   Financial Resource Strain: Low Risk   . Difficulty of Paying Living Expenses: Not very hard  Food Insecurity: Not on file  Transportation Needs: Not on file  Physical Activity: Not on file  Stress: Not on file  Social Connections: Not on file    Allergies:  Allergies  Allergen Reactions  . Onion Anaphylaxis  . Hydrocodone   . Norco [Hydrocodone-Acetaminophen] Itching  . Hydrocodone-Acetaminophen Itching  . Nickel Rash    Metabolic Disorder Labs: Lab Results  Component Value Date   HGBA1C 10.7 (A) 07/12/2020   MPG 294.83 05/31/2017   No results found for: PROLACTIN Lab Results  Component Value Date   CHOL 134 07/15/2015   TRIG 227 (H)  07/15/2015   HDL 28 (L) 07/15/2015   CHOLHDL 4.8 07/15/2015   VLDL 26 12/09/2012   LDLCALC 61 07/15/2015   LDLCALC 96 12/09/2012   Lab Results  Component Value Date   TSH 4.120 06/10/2018   TSH 2.000 07/15/2015    Therapeutic Level Labs: No results found for: LITHIUM No results found for: VALPROATE No components found for:  CBMZ  Current Medications: Current Outpatient Medications  Medication Sig Dispense Refill  . ACCU-CHEK FASTCLIX LANCETS MISC yse as directed twice a day 100 each 1  . albuterol (PROAIR HFA) 108 (90 Base) MCG/ACT inhaler Inhale 2-4 puffs by mouth Every 4-6 hours as needed for wheezing,  cough, and/or shortness of breath 3 Inhaler 1  . aspirin EC 81 MG tablet Take 81 mg by mouth daily at 3 pm.     . atorvastatin (LIPITOR) 10 MG tablet Take 1 tablet (10 mg total) by mouth daily. 90 tablet 1  . buPROPion (WELLBUTRIN SR) 150 MG 12 hr tablet Take 1 tablet (150 mg total) by mouth 2 (two) times daily. 60 tablet 1  . colestipol (COLESTID) 1 g tablet Take 2 tablets (2 g total) by mouth daily for 30 doses. 60 tablet 2  . Continuous Blood Gluc Receiver (FREESTYLE LIBRE 2 READER) DEVI Use as directed to read blood sugar 4 times a day  Dx E11.65 1 each 5  . Continuous Blood Gluc Sensor (FREESTYLE LIBRE 14 DAY SENSOR) MISC 1 each by Does not apply route every 14 (fourteen) days. Apply to skin every 14 days E11.65 6 each 12  . dapagliflozin propanediol (FARXIGA) 10 MG TABS tablet Take 1 tablet (10 mg total) by mouth daily. (Patient not taking: Reported on 08/17/2020) 30 tablet 0  . DULoxetine (CYMBALTA) 60 MG capsule Take 1 capsule (60 mg total) by mouth daily. 30 capsule 1  . famotidine (PEPCID) 20 MG tablet Take 1 tablet (20 mg total) by mouth 2 (two) times daily. 180 tablet 1  . glucose blood (ACCU-CHEK AVIVA PLUS) test strip Blood sugar testing TID and as needed. E11.65 100 each 12  . hydrOXYzine (ATARAX/VISTARIL) 10 MG tablet Take 1-2 tablets (10-20 mg total) by mouth at  bedtime as needed. FOR SLEEP 30 tablet 2  . insulin glargine, 2 Unit Dial, (TOUJEO MAX SOLOSTAR) 300 UNIT/ML Solostar Pen Inject 70 Units into the skin daily. Patient to increase dose to 70 units at bedtime 10 mL 3  . insulin lispro (HUMALOG KWIKPEN) 100 UNIT/ML KwikPen Use as directed per  sliding scale max units 8-10 per meal (Patient taking differently: Inject 12-15 Units into the skin 3 (three) times daily between meals. Sliding Scale Insulin) 15 mL 2  . Insulin Pen Needle 32G X 4 MM MISC Use with daily dose insulin and with sliding scale insulin as needed. 100 each 5  . metoprolol tartrate (LOPRESSOR) 100 MG tablet Take 1 tablet (100 mg total) by mouth 2 (two) times daily. 180 tablet 1  . mometasone-formoterol (DULERA) 100-5 MCG/ACT AERO Inhale 2 puffs into the lungs 2 (two) times daily. 13 g 2  . Na Sulfate-K Sulfate-Mg Sulf 17.5-3.13-1.6 GM/177ML SOLN At 5 PM the day before procedure take 1 bottle and 5 hours before procedure take 1 bottle. 354 mL 0  . nicotine polacrilex (NICORETTE) 4 MG gum Take 1 each (4 mg total) by mouth as needed for smoking cessation. 100 tablet 0  . nitroGLYCERIN (NITROSTAT) 0.4 MG SL tablet Place 1 tablet (0.4 mg total) under the tongue every 5 (five) minutes x 3 doses as needed for chest pain. 30 tablet 1  . pantoprazole (PROTONIX) 40 MG tablet Take 1 tablet (40 mg total) by mouth daily. 90 tablet 1  . sitaGLIPtin-metformin (JANUMET) 50-500 MG tablet Take 1 tablet by mouth 2 (two) times daily. 180 tablet 1  . temazepam (RESTORIL) 30 MG capsule Take 1 capsule (30 mg total) by mouth at bedtime. 30 capsule 2   No current facility-administered medications for this visit.     Musculoskeletal: Strength & Muscle Tone: UTA Gait & Station: UTA Patient leans: N/A  Psychiatric Specialty Exam: Review of Systems  Psychiatric/Behavioral: Negative for agitation, behavioral problems, confusion, decreased concentration, dysphoric mood, hallucinations,  self-injury, sleep  disturbance and suicidal ideas. The patient is nervous/anxious. The patient is not hyperactive.   All other systems reviewed and are negative.   There were no vitals taken for this visit.There is no height or weight on file to calculate BMI.  General Appearance: Casual  Eye Contact:  Fair  Speech:  Clear and Coherent  Volume:  Normal   Mood: Anxious coping well  Affect:  Congruent  Thought Process:  Goal Directed and Descriptions of Associations: Intact  Orientation:  Full (Time, Place, and Person)  Thought Content: Logical   Suicidal Thoughts:  No  Homicidal Thoughts:  No  Memory:  Immediate;   Fair Recent;   Fair Remote;   Fair  Judgement:  Fair  Insight:  Fair  Psychomotor Activity:  Normal  Concentration:  Concentration: Fair and Attention Span: Fair  Recall:  [employer] of Knowledge: Fair  Language: Fair  Akathisia:  No  Handed:  Right  AIMS (if indicated): UTA  Assets:  Communication Skills Desire for Improvement Housing Social Support  ADL's:  Intact  Cognition: WNL  Sleep:  Fair   Screenings: Mini-Mental   Flowsheet Row Clinical Support from 10/17/2019 in [facility], [place] from 10/08/2018 in [facility] [place] from 10/05/2017 in [facility], [facility]  Total Score (max 30 points ) 30 27 30     PHQ2-9   [place] Visit from 07/12/2020 in [facility] Video Visit from 06/23/2020 in [place] Office Visit from 04/12/2020 in [facility], [facility] Office Visit from 01/16/2020 in [facility] Office Visit from 03/03/2019 in [facility], [facility]  PHQ-2 Total Score 0 1 0 0 0    Flowsheet Row Video Visit from 06/23/2020 in [place] Low Risk       Assessment and Plan: XAVIOR NIAZI is a 55 year old Caucasian male, lives in [place], divorced, disabled, has a history  of PTSD, MDD, insomnia, diabetes mellitus, 6 cranial nerve palsy, history of vision loss, neuropathy was evaluated by telemedicine today.  Patient is biologically predisposed given multiple health problems, history of trauma.  Patient with psychosocial stressors of several deaths in the family, his own health issues.  Patient does report mood and sleep is improved.  Discussed plan as noted below.  Plan MDD in remission Cymbalta as prescribed Wellbutrin as prescribed  Insomnia-improving Temazepam 30 mg p.o. nightly Continue CPAP for OSA Hydroxyzine 10-20 mg p.o. nightly as needed  PTSD-stable Cymbalta as prescribed  Tobacco use disorder-unstable Increase Wellbutrin SR to 150 mg p.o. twice daily Offered smoking cessation classes in the past-declined  Provided drug to drug interaction of Wellbutrin with his medications like metoprolol.  Also advised patient to let writer know if he has any side effects or if the Wellbutrin affects his sleep.  Follow-up in clinic in 3 to 4 weeks or sooner if needed.  This note was generated in part or whole with voice recognition software. Voice recognition is usually quite accurate but there are transcription errors that can and very often do occur. I apologize for any typographical errors that were not detected and corrected.        Ursula Alert, MD 08/17/2020, 6:46 PM

## 2020-08-16 NOTE — Telephone Encounter (Signed)
Notified patient of LDCT lung cancer screening program results with recommendation for 12 month follow up imaging. Also notified of incidental findings noted below and is encouraged to discuss further with PCP who will receive a copy of this note and/or the CT report. Patient verbalizes understanding. Patient is aware of need for discussion with his PCP regarding his cardiac health and possible pulmonary artery hypertension.   IMPRESSION: 1. Lung-RADS 2, benign appearance or behavior. Continue annual screening with low-dose chest CT without contrast in 12 months. 2. One vessel coronary atherosclerosis. 3. Small hiatal hernia. 4. Aortic Atherosclerosis (ICD10-I70.0) and Emphysema (ICD10-J43.9).

## 2020-08-17 ENCOUNTER — Ambulatory Visit: Payer: Medicare Other | Admitting: Pharmacist

## 2020-08-17 DIAGNOSIS — F17210 Nicotine dependence, cigarettes, uncomplicated: Secondary | ICD-10-CM

## 2020-08-17 DIAGNOSIS — K219 Gastro-esophageal reflux disease without esophagitis: Secondary | ICD-10-CM

## 2020-08-17 DIAGNOSIS — E756 Lipid storage disorder, unspecified: Secondary | ICD-10-CM | POA: Diagnosis not present

## 2020-08-17 DIAGNOSIS — E11649 Type 2 diabetes mellitus with hypoglycemia without coma: Secondary | ICD-10-CM

## 2020-08-17 DIAGNOSIS — R5383 Other fatigue: Secondary | ICD-10-CM | POA: Diagnosis not present

## 2020-08-17 DIAGNOSIS — E782 Mixed hyperlipidemia: Secondary | ICD-10-CM | POA: Diagnosis not present

## 2020-08-17 NOTE — Patient Instructions (Addendum)
Visit Information  Goals Addressed            This Visit's Progress   . Monitor and Manage My Blood Sugar-Diabetes Type 2       Timeframe:  Long-Range Goal Priority:  High Start Date:  08/17/20                           Expected End Date:   02/16/21                    Follow Up Date 11/16/20   - check blood sugar at prescribed times - check blood sugar if I feel it is too high or too low - enter blood sugar readings and medication or insulin into daily log - take the blood sugar log to all doctor visits    Why is this important?    Checking your blood sugar at home helps to keep it from getting very high or very low.   Writing the results in a diary or log helps the doctor know how to care for you.   Your blood sugar log should have the time, date and the results.   Also, write down the amount of insulin or other medicine that you take.   Other information, like what you ate, exercise done and how you were feeling, will also be helpful.     Notes: Pharmacist working on CGM from [place].      Patient Care Plan: General Pharmacy (Adult)    Problem Identified: HTN, HLD, DM, GERD. Tobacco use   Priority: High  Onset Date: 08/17/2020    Long-Range Goal: Patient-Specific Goal   Start Date: 08/17/2020  Expected End Date: 02/16/2021  This Visit's Progress: On track  Priority: High  Note:   Current Barriers:  . Unable to achieve control of glucose.  . Does not adhere to prescribed medication regimen  Pharmacist Clinical Goal(s):  Marland Kitchen Patient will achieve control of glucose as evidenced by A1c testing . adhere to plan to optimize therapeutic regimen for diabetes as evidenced by report of adherence to recommended medication management changes . adhere to prescribed medication regimen as evidenced by fill dates. through collaboration with PharmD and provider.   Interventions: . 1:1 collaboration with Lavera Guise, MD regarding development and update of comprehensive plan of  care as evidenced by provider attestation and co-signature . Inter-disciplinary care team collaboration (see longitudinal plan of care) . Comprehensive medication review performed; medication list updated in electronic medical record  Hypertension (BP goal <130/80) -Controlled -Current treatment: . Metoprolol tartrate 100mg  BID -Medications previously tried: none noted -Current home readings: patient reports most are around 130/80.  Did not have specific logs with him today -Current dietary habits: now using his air fryer a lot more.  He does not fry in butter or oil as much as he used to.  Trying to avoid carbohydrates as much as possible.  He does report sodas are his weakness.  Usually drinks about 5 to 6 sodas per day. -Current exercise habits: will go outside and walk around with his dogs 4 to 5 times per day.  Used to have a membership to [employer] but has not been back since [place]. -Denies hypotensive/hypertensive symptoms -Educated on BP goals and benefits of medications for prevention of heart attack, stroke and kidney damage; Daily salt intake goal < 2300 mg; Exercise goal of 150 minutes per week; Importance of home blood pressure monitoring; -Counseled  to monitor BP at home daily, document, and provide log at future appointments -Recommended to continue current medication Recommended walking plan for 15-20 minutes per day, either outside or back in the gym.  Diabetes (A1c goal <8%) - initial goal.  Would eventually like to see him < 7. -Uncontrolled -Current medications: . Janumet 50-500mg  twice daily . Toujeo 70 units into the skin hs . Humalog 8-10 units per meal as sliding scale -Medications previously tried: [place]  -Current home glucose readings . fasting glucose: did not bring logs but reports all his readings have been "high" lately . post prandial glucose:  -Reports hypoglycemic/hyperglycemic symptoms - numbess in feet, he sometimes cannot feel his dogs nibbling at  his toes -Current meal patterns: SODAS 5 to 6 per day, trying to limit breads with his meats, using air fryer vs eating regular fried foods -Current exercise: walking dogs -Educated on A1c and blood sugar goals; Exercise goal of 150 minutes per week; Benefits of weight loss; Benefits of routine self-monitoring of blood sugar; Continuous glucose monitoring;  -He reports that his blood sugar was really well controlled when he was using the trial of Freestyle libre.  Has been having issues getting this approved and has seen his blood sugar control decline as he often does not prick his finger while he is out and this means he does not take his sliding scale injection. -Also unsure if he should still be taking [place], he reports someone took him off of it but I cannot find documentation of that. -Counseled to check feet daily and get yearly eye exams -Recommended to continue current medication Collaborate with PCP to determine if patient should still be taking [place].  Will work with DME suppliers to get patient [employer] as he qualifies based on number of injections.  Really encouraged patient to find replacement for sodas or at least limit use.  Monitoring plan initiated moving forward. 20 Minutes of documentation spent on application to [employer] health for DME supplies and [employer] reader/sensors.  Tobacco use (Goal Smoking cessation) -Uncontrolled -Previous quit attempts: none -Current treatment  . Bupropoion SR 150mg  BID  -Patient triggers include: boredom  -Patient smoking 1.5 ppd currently.  He wants to quit but has not had luck with patch or gum.  Is allergic to adhesive on patch. -Recommended hard candies, straws, etc to use as a replacelement.  GERD (Goal: Minimize symptoms) -Controlled -Current treatment  . Pantoprazole 40mg  daily . Famotidine 20mg  BID -Medications previously tried: none noted -Recently discovered he has hiatal hernia. -Denies any symptoms  except for when he lays down at night.   -Recommended to continue current medication Recommended elevat head of the bed when he lays down.   Patient Goals/Self-Care Activities . Patient will:  - take medications as prescribed focus on medication adherence by pill count check glucose daily, document, and provide at future appointments check blood pressure daily, document, and provide at future appointments engage in dietary modifications by limiting soda intake  Follow Up Plan: The care management team will reach out to the patient again over the next 90 days.        Mr. Ly was given information about Chronic Care Management services today including:  1. CCM service includes personalized support from designated clinical staff supervised by his physician, including individualized plan of care and coordination with other care providers 2. 24/7 contact phone numbers for assistance for urgent and routine care needs. 3. Standard insurance, coinsurance, copays and deductibles apply for chronic care  management only during months in which we provide at least 20 minutes of these services. Most insurances cover these services at 100%, however patients may be responsible for any copay, coinsurance and/or deductible if applicable. This service may help you avoid the need for more expensive face-to-face services. 4. Only one practitioner may furnish and bill the service in a calendar month. 5. The patient may stop CCM services at any time (effective at the end of the month) by phone call to the office staff.  Patient agreed to services and verbal consent obtained.   The patient verbalized understanding of instructions, educational materials, and care plan provided today and agreed to receive a mailed copy of patient instructions, educational materials, and care plan.  Telephone follow up appointment with pharmacy team member scheduled for: 2 months  Edythe Clarity, [facility]

## 2020-08-17 NOTE — Procedures (Signed)
[facility] [address] [place], [address]    Complete Pulmonary Function Testing Interpretation:  FINDINGS:  Forced vital capacity is normal the FEV1 is normal.  F1 FVC ratio was mildly decreased.  Total capacity was normal residual volume is decreased residual in total lung capacity ratio was decreased.  FRC was decreased.  DLCO is normal.  Postbronchodilator no significant change in FEV1  IMPRESSION:  This pulmonary function study is within normal limits  Allyne Gee, MD [facility] Pulmonary Critical Care Medicine Sleep Medicine

## 2020-08-18 LAB — COMPREHENSIVE METABOLIC PANEL
ALT: 52 IU/L — ABNORMAL HIGH (ref 0–44)
AST: 33 IU/L (ref 0–40)
Albumin/Globulin Ratio: 1.1 — ABNORMAL LOW (ref 1.2–2.2)
Albumin: 3.9 g/dL (ref 3.8–4.9)
Alkaline Phosphatase: 143 IU/L — ABNORMAL HIGH (ref 44–121)
BUN/Creatinine Ratio: 13 (ref 9–20)
BUN: 14 mg/dL (ref 6–24)
Bilirubin Total: 0.2 mg/dL (ref 0.0–1.2)
CO2: 22 mmol/L (ref 20–29)
Calcium: 10.2 mg/dL (ref 8.7–10.2)
Chloride: 96 mmol/L (ref 96–106)
Creatinine, Ser: 1.06 mg/dL (ref 0.76–1.27)
Globulin, Total: 3.7 g/dL (ref 1.5–4.5)
Glucose: 400 mg/dL — ABNORMAL HIGH (ref 65–99)
Potassium: 5.1 mmol/L (ref 3.5–5.2)
Sodium: 133 mmol/L — ABNORMAL LOW (ref 134–144)
Total Protein: 7.6 g/dL (ref 6.0–8.5)
eGFR: 83 mL/min/{1.73_m2} (ref >59)

## 2020-08-18 LAB — LIPID PANEL WITH LDL/HDL RATIO
Cholesterol, Total: 280 mg/dL — ABNORMAL HIGH (ref 100–199)
HDL: 31 mg/dL — ABNORMAL LOW (ref >39)
LDL Chol Calc (NIH): 153 mg/dL — ABNORMAL HIGH (ref 0–99)
LDL/HDL Ratio: 4.9 ratio — ABNORMAL HIGH (ref 0.0–3.6)
Triglycerides: 501 mg/dL — ABNORMAL HIGH (ref 0–149)
VLDL Cholesterol Cal: 96 mg/dL — ABNORMAL HIGH (ref 5–40)

## 2020-08-18 LAB — CBC WITH DIFFERENTIAL/PLATELET
Basophils Absolute: 0.1 10*3/uL (ref 0.0–0.2)
Basos: 1 %
EOS (ABSOLUTE): 0.2 10*3/uL (ref 0.0–0.4)
Eos: 2 %
Hematocrit: 45.9 % (ref 37.5–51.0)
Hemoglobin: 15.6 g/dL (ref 13.0–17.7)
Immature Grans (Abs): 0.1 10*3/uL (ref 0.0–0.1)
Immature Granulocytes: 1 %
Lymphocytes Absolute: 3.1 10*3/uL (ref 0.7–3.1)
Lymphs: 36 %
MCH: 30.5 pg (ref 26.6–33.0)
MCHC: 34 g/dL (ref 31.5–35.7)
MCV: 90 fL (ref 79–97)
Monocytes Absolute: 0.7 10*3/uL (ref 0.1–0.9)
Monocytes: 8 %
Neutrophils Absolute: 4.5 10*3/uL (ref 1.4–7.0)
Neutrophils: 52 %
Platelets: 213 10*3/uL (ref 150–450)
RBC: 5.11 x10E6/uL (ref 4.14–5.80)
RDW: 13.1 % (ref 11.6–15.4)
WBC: 8.5 10*3/uL (ref 3.4–10.8)

## 2020-08-18 LAB — PSA, TOTAL AND FREE
PSA, Free Pct: 18 %
PSA, Free: 0.18 ng/mL
Prostate Specific Ag, Serum: 1 ng/mL (ref 0.0–4.0)

## 2020-08-18 LAB — TSH+FREE T4
Free T4: 1.08 ng/dL (ref 0.82–1.77)
TSH: 1.89 u[IU]/mL (ref 0.450–4.500)

## 2020-08-18 NOTE — Progress Notes (Signed)
Will get patient scheduled to review lab abnormalities prior to his next scheduled follow-up.

## 2020-08-19 ENCOUNTER — Telehealth: Payer: Self-pay

## 2020-08-19 NOTE — Telephone Encounter (Signed)
Noted  

## 2020-08-19 NOTE — Telephone Encounter (Signed)
Called [facility] back on rx for freestyle libre for pt and they advised they needed futher documentation on pt's insulin medication usuage, spoke to [place] and I informed him that I will send the last office note to [phone]

## 2020-08-21 DIAGNOSIS — E1165 Type 2 diabetes mellitus with hyperglycemia: Secondary | ICD-10-CM | POA: Diagnosis not present

## 2020-08-21 DIAGNOSIS — I10 Essential (primary) hypertension: Secondary | ICD-10-CM | POA: Diagnosis not present

## 2020-08-21 DIAGNOSIS — K219 Gastro-esophageal reflux disease without esophagitis: Secondary | ICD-10-CM | POA: Diagnosis not present

## 2020-08-25 ENCOUNTER — Ambulatory Visit: Payer: Medicare Other | Admitting: Registered Nurse

## 2020-08-25 ENCOUNTER — Encounter: Payer: Self-pay | Admitting: Gastroenterology

## 2020-08-25 ENCOUNTER — Ambulatory Visit
Admission: RE | Admit: 2020-08-25 | Discharge: 2020-08-25 | Disposition: A | Discharge status: Home / Self Care | Payer: Medicare Other | Attending: Gastroenterology | Admitting: Gastroenterology

## 2020-08-25 ENCOUNTER — Encounter
Admission: RE | Disposition: A | Discharge status: Home / Self Care | Payer: Self-pay | Source: Home / Self Care | Attending: Gastroenterology

## 2020-08-25 DIAGNOSIS — Z886 Allergy status to analgesic agent status: Secondary | ICD-10-CM | POA: Diagnosis not present

## 2020-08-25 DIAGNOSIS — K2100 Gastro-esophageal reflux disease with esophagitis, without bleeding: Secondary | ICD-10-CM | POA: Diagnosis not present

## 2020-08-25 DIAGNOSIS — Z91048 Other nonmedicinal substance allergy status: Secondary | ICD-10-CM | POA: Insufficient documentation

## 2020-08-25 DIAGNOSIS — K3189 Other diseases of stomach and duodenum: Secondary | ICD-10-CM | POA: Diagnosis not present

## 2020-08-25 DIAGNOSIS — F17210 Nicotine dependence, cigarettes, uncomplicated: Secondary | ICD-10-CM | POA: Diagnosis not present

## 2020-08-25 DIAGNOSIS — Z91018 Allergy to other foods: Secondary | ICD-10-CM | POA: Insufficient documentation

## 2020-08-25 DIAGNOSIS — Z885 Allergy status to narcotic agent status: Secondary | ICD-10-CM | POA: Insufficient documentation

## 2020-08-25 DIAGNOSIS — K2970 Gastritis, unspecified, without bleeding: Secondary | ICD-10-CM | POA: Diagnosis not present

## 2020-08-25 DIAGNOSIS — K635 Polyp of colon: Secondary | ICD-10-CM

## 2020-08-25 DIAGNOSIS — D125 Benign neoplasm of sigmoid colon: Secondary | ICD-10-CM | POA: Insufficient documentation

## 2020-08-25 DIAGNOSIS — E785 Hyperlipidemia, unspecified: Secondary | ICD-10-CM | POA: Diagnosis not present

## 2020-08-25 DIAGNOSIS — E119 Type 2 diabetes mellitus without complications: Secondary | ICD-10-CM | POA: Diagnosis not present

## 2020-08-25 DIAGNOSIS — K319 Disease of stomach and duodenum, unspecified: Secondary | ICD-10-CM

## 2020-08-25 DIAGNOSIS — K648 Other hemorrhoids: Secondary | ICD-10-CM | POA: Insufficient documentation

## 2020-08-25 DIAGNOSIS — Z7982 Long term (current) use of aspirin: Secondary | ICD-10-CM | POA: Diagnosis not present

## 2020-08-25 DIAGNOSIS — Z1211 Encounter for screening for malignant neoplasm of colon: Secondary | ICD-10-CM | POA: Diagnosis not present

## 2020-08-25 DIAGNOSIS — K2270 Barrett's esophagus without dysplasia: Secondary | ICD-10-CM

## 2020-08-25 DIAGNOSIS — K5520 Angiodysplasia of colon without hemorrhage: Secondary | ICD-10-CM | POA: Diagnosis not present

## 2020-08-25 DIAGNOSIS — Z9049 Acquired absence of other specified parts of digestive tract: Secondary | ICD-10-CM | POA: Diagnosis not present

## 2020-08-25 DIAGNOSIS — Z794 Long term (current) use of insulin: Secondary | ICD-10-CM | POA: Insufficient documentation

## 2020-08-25 DIAGNOSIS — Z79899 Other long term (current) drug therapy: Secondary | ICD-10-CM | POA: Diagnosis not present

## 2020-08-25 DIAGNOSIS — Z86010 Personal history of colonic polyps: Secondary | ICD-10-CM | POA: Diagnosis not present

## 2020-08-25 DIAGNOSIS — Z7951 Long term (current) use of inhaled steroids: Secondary | ICD-10-CM | POA: Diagnosis not present

## 2020-08-25 DIAGNOSIS — L539 Erythematous condition, unspecified: Secondary | ICD-10-CM | POA: Diagnosis not present

## 2020-08-25 HISTORY — PX: COLONOSCOPY WITH PROPOFOL: SHX5780

## 2020-08-25 HISTORY — DX: Atherosclerotic heart disease of native coronary artery without angina pectoris: I25.10

## 2020-08-25 HISTORY — PX: ESOPHAGOGASTRODUODENOSCOPY (EGD) WITH PROPOFOL: SHX5813

## 2020-08-25 HISTORY — DX: Emphysema, unspecified: J43.9

## 2020-08-25 LAB — GLUCOSE, CAPILLARY: Glucose-Capillary: 214 mg/dL — ABNORMAL HIGH (ref 70–99)

## 2020-08-25 SURGERY — ESOPHAGOGASTRODUODENOSCOPY (EGD) WITH PROPOFOL
Anesthesia: General

## 2020-08-25 MED ORDER — PROPOFOL 10 MG/ML IV BOLUS
INTRAVENOUS | Status: DC | PRN
  Administered 2020-08-25: 90 mg via INTRAVENOUS

## 2020-08-25 MED ORDER — SODIUM CHLORIDE 0.9 % IV SOLN: INTRAVENOUS | Status: DC

## 2020-08-25 MED ORDER — PROPOFOL 500 MG/50ML IV EMUL
INTRAVENOUS | Status: AC
  Filled 2020-08-25: qty 50

## 2020-08-25 MED ORDER — DEXMEDETOMIDINE (PRECEDEX) IN NS 20 MCG/5ML (4 MCG/ML) IV SYRINGE
PREFILLED_SYRINGE | INTRAVENOUS | Status: DC | PRN
  Administered 2020-08-25: 12 ug via INTRAVENOUS
  Administered 2020-08-25: 8 ug via INTRAVENOUS

## 2020-08-25 MED ORDER — GLYCOPYRROLATE 0.2 MG/ML IJ SOLN
INTRAMUSCULAR | Status: DC | PRN
  Administered 2020-08-25: .2 mg via INTRAVENOUS

## 2020-08-25 MED ORDER — PROPOFOL 500 MG/50ML IV EMUL
INTRAVENOUS | Status: DC | PRN
  Administered 2020-08-25: 150 ug/kg/min via INTRAVENOUS

## 2020-08-25 MED ORDER — PHENYLEPHRINE HCL (PRESSORS) 10 MG/ML IV SOLN
INTRAVENOUS | Status: DC | PRN
  Administered 2020-08-25: 100 ug via INTRAVENOUS

## 2020-08-25 MED ORDER — LIDOCAINE HCL (CARDIAC) PF 100 MG/5ML IV SOSY
PREFILLED_SYRINGE | INTRAVENOUS | Status: DC | PRN
  Administered 2020-08-25: 40 mg via INTRAVENOUS

## 2020-08-25 NOTE — Transfer of Care (Signed)
Immediate Anesthesia Transfer of Care Note  Patient: Raymond [name]  Procedure(s) Performed: Procedure(s): ESOPHAGOGASTRODUODENOSCOPY (EGD) WITH PROPOFOL (N/A) COLONOSCOPY WITH PROPOFOL (N/A)  Patient Location: PACU and Endoscopy Unit  Anesthesia Type:General  Level of Consciousness: sedated  Airway & Oxygen Therapy: Patient Spontanous Breathing and Patient connected to nasal cannula oxygen  Post-op Assessment: Report given to RN and Post -op Vital signs reviewed and stable  Post vital signs: Reviewed and stable  Last Vitals:  Vitals:   08/25/20 1000 08/25/20 1010  BP: 108/80 128/79  Pulse: 86 75  Resp: (!) 21 (!) 27  Temp:    SpO2: 74% 82%    Complications: No apparent anesthesia complications

## 2020-08-25 NOTE — Anesthesia Preprocedure Evaluation (Addendum)
Anesthesia Evaluation  Patient identified by MRN, date of birth, ID band Patient awake    Reviewed: Allergy & Precautions, H&P , NPO status , reviewed documented beta blocker date and time   Airway Mallampati: III  TM Distance: >3 FB Neck ROM: full    Dental  (+) Edentulous Upper, Edentulous Lower, Upper Dentures, Lower Dentures   Pulmonary shortness of breath and with exertion, sleep apnea and Continuous Positive Airway Pressure Ventilation , COPD, neg recent URI, Current Smoker and Patient abstained from smoking.,    Pulmonary exam normal        Cardiovascular hypertension, (-) angina+ CAD and + Past MI  (-) Cardiac Stents and (-) CABG Normal cardiovascular exam(-) dysrhythmias (-) Valvular Problems/Murmurs     Neuro/Psych  Headaches, neg Seizures PSYCHIATRIC DISORDERS Anxiety Depression  Neuromuscular disease    GI/Hepatic Neg liver ROS, GERD  Medicated and Poorly Controlled,  Endo/Other  diabetes  Renal/GU negative Renal ROS     Musculoskeletal  (+) Arthritis ,   Abdominal   Peds  Hematology   Anesthesia Other Findings Past Medical History: No date: Anxiety No date: Arthritis     Comment:  NECK No date: Bronchitis No date: Depression No date: Diabetes mellitus without complication ([place]) 49/70/2637: Diastasis of rectus abdominis No date: Dyspnea     Comment:  DUE TO GALLBLADDER PER PT No date: Family history of adverse reaction to anesthesia     Comment:  N/V, TROUBLE BREATHING COMING OUT OF ANESTHESIA No date: Fatty liver No date: GERD (gastroesophageal reflux disease) No date: Headache     Comment:  MIGRAINES No date: Hyperlipidemia No date: Hypertension unk: Irregular heart beat 2010: Myocardial infarction ([place])     Comment:  MILD  No date: PTSD (post-traumatic stress disorder) No date: Sleep apnea     Comment:  USES CPAP No date: Tachycardia Past Surgical History: No date:  ingrown toenail  removal No date: CLAVICLE SURGERY; Right 2017: COLONOSCOPY No date: MOUTH SURGERY     Comment:  REMOVED ALL TEETH No date: SHOULDER SURGERY; Right 2017: UPPER GI ENDOSCOPY BMI    Body Mass Index: 36.79 kg/m     Reproductive/Obstetrics                            Anesthesia Physical  Anesthesia Plan  ASA: III  Anesthesia Plan: General   Post-op Pain Management:    Induction: Intravenous  PONV Risk Score and Plan: 2 and TIVA and Propofol infusion  Airway Management Planned: Nasal Cannula  Additional Equipment:   Intra-op Plan:   Post-operative Plan: Extubation in OR  Informed Consent: I have reviewed the patients History and Physical, chart, labs and discussed the procedure including the risks, benefits and alternatives for the proposed anesthesia with the patient or authorized representative who has indicated his/her understanding and acceptance.     Dental Advisory Given  Plan Discussed with: CRNA  Anesthesia Plan Comments:        Anesthesia Quick Evaluation

## 2020-08-25 NOTE — Anesthesia Postprocedure Evaluation (Signed)
Anesthesia Post Note  Patient: Raymond [name]  Procedure(s) Performed: ESOPHAGOGASTRODUODENOSCOPY (EGD) WITH PROPOFOL (N/A ) COLONOSCOPY WITH PROPOFOL (N/A )  Patient location during evaluation: Endoscopy Anesthesia Type: General Level of consciousness: awake and alert Pain management: pain level controlled Vital Signs Assessment: post-procedure vital signs reviewed and stable Respiratory status: spontaneous breathing, nonlabored ventilation, respiratory function stable and patient connected to nasal cannula oxygen Cardiovascular status: blood pressure returned to baseline and stable Postop Assessment: no apparent nausea or vomiting Anesthetic complications: no   No complications documented.   Last Vitals:  Vitals:   08/25/20 1010 08/25/20 1020  BP: 128/79 125/84  Pulse: 75 75  Resp: (!) 27 (!) 22  Temp:    SpO2: 97% 96%    Last Pain:  Vitals:   08/25/20 0848  TempSrc: Temporal  PainSc: 0-No pain                 Martha Clan

## 2020-08-25 NOTE — Op Note (Addendum)
[facility] Patient Name: Raymond [name] Procedure Date: 08/25/2020 9:03 AM MRN: [id number] Account #: [id number] Date of Birth: Aug 22, 1965 Admit Type: Outpatient Age: 55 Room: [facility] ENDO ROOM 3 Gender: Male Note Status: Finalized Procedure:             Colonoscopy Indications:           Screening for colorectal malignant neoplasm Providers:             Tynlee Bayle B. Bonna Gains MD, MD Medicines:             Monitored Anesthesia Care Complications:         No immediate complications. Procedure:             Pre-Anesthesia Assessment:                        - ASA Grade Assessment: II - A patient with mild                         systemic disease.                        - Prior to the procedure, a History and Physical was                         performed, and patient medications, allergies and                         sensitivities were reviewed. The patient's tolerance                         of previous anesthesia was reviewed.                        - The risks and benefits of the procedure and the                         sedation options and risks were discussed with the                         patient. All questions were answered and informed                         consent was obtained.                        - Patient identification and proposed procedure were                         verified prior to the procedure by the physician, the                         nurse, the anesthesiologist, the anesthetist and the                         technician. The procedure was verified in the                         procedure room.  After obtaining informed consent, the colonoscope was                         passed under direct vision. Throughout the procedure,                         the patient's blood pressure, pulse, and oxygen                         saturations were monitored continuously. The                         Colonoscope  was introduced through the anus and                         advanced to the the cecum, identified by appendiceal                         orifice and ileocecal valve. The colonoscopy was                         performed with ease. The patient tolerated the                         procedure well. The quality of the bowel preparation                         was fair. Findings:      The perianal and digital rectal examinations were normal.      A 6 mm polyp was found in the sigmoid colon. The polyp was sessile. The       polyp was removed with a cold snare. Resection and retrieval were       complete.      A single small angioectasia without bleeding was found in the cecum. No       indication for treatment given this was an incidental finding.      The exam was otherwise without abnormality.      The rectum, sigmoid colon, descending colon, transverse colon, ascending       colon and cecum appeared normal.      Non-bleeding internal hemorrhoids were found during retroflexion.      No additional abnormalities were found on retroflexion. Impression:            - Preparation of the colon was fair.                        - One 6 mm polyp in the sigmoid colon, removed with a                         cold snare. Resected and retrieved.                        - A single non-bleeding colonic angioectasia.                        - The examination was otherwise normal.                        - The rectum, sigmoid  colon, descending colon,                         transverse colon, ascending colon and cecum are normal.                        - Non-bleeding internal hemorrhoids. Recommendation:        - Await pathology results.                        - Discharge patient to home (with escort).                        - Advance diet as tolerated.                        - Continue present medications.                        - Repeat colonoscopy in 3 years, with 2 day prep.                        - The  findings and recommendations were discussed with                         the patient.                        - The findings and recommendations were discussed with                         the patient's family.                        - Return to primary care physician as previously                         scheduled.                        - High fiber diet. Procedure Code(s):     --- Professional ---                        [phone], Colonoscopy, flexible; with removal of                         tumor(s), polyp(s), or other lesion(s) by snare                         technique Diagnosis Code(s):     --- Professional ---                        Z12.11, Encounter for screening for malignant neoplasm                         of colon                        K63.5, Polyp of colon CPT copyright 2019 American Medical Association. All rights reserved. The codes documented in this report are preliminary and upon coder review may  be revised to meet current compliance requirements.  [name], MD Margretta Sidle B. Bonna Gains MD, MD 08/25/2020 10:01:45 AM This report has been signed electronically. Number of Addenda: 0 Note Initiated On: 08/25/2020 9:03 AM Scope Withdrawal Time: 0 hours 16 minutes 19 seconds  Total Procedure Duration: 0 hours 19 minutes 4 seconds  Estimated Blood Loss:  Estimated blood loss: none.      [facility]

## 2020-08-25 NOTE — Op Note (Signed)
[facility] Patient Name: Raymond [name] Procedure Date: 08/25/2020 9:04 AM MRN: [id number] Account #: [id number] Date of Birth: 1965/09/04 Admit Type: Outpatient Age: 55 Room: [facility] ENDO ROOM 3 Gender: Male Note Status: Finalized Procedure:             Upper GI endoscopy Indications:           Heartburn, Follow-up of reflux esophagitis Providers:             Shariq Puig B. Bonna Gains MD, MD Medicines:             Monitored Anesthesia Care Complications:         No immediate complications. Procedure:             Pre-Anesthesia Assessment:                        - Prior to the procedure, a History and Physical was                         performed, and patient medications, allergies and                         sensitivities were reviewed. The patient's tolerance                         of previous anesthesia was reviewed.                        - The risks and benefits of the procedure and the                         sedation options and risks were discussed with the                         patient. All questions were answered and informed                         consent was obtained.                        - Patient identification and proposed procedure were                         verified prior to the procedure by the physician, the                         nurse, the anesthesiologist, the anesthetist and the                         technician. The procedure was verified in the                         procedure room.                        - ASA Grade Assessment: II - A patient with mild                         systemic disease.  After obtaining informed consent, the endoscope was                         passed under direct vision. Throughout the procedure,                         the patient's blood pressure, pulse, and oxygen                         saturations were monitored continuously. The Endoscope                          was introduced through the mouth, and advanced to the                         second part of duodenum. The upper GI endoscopy was                         accomplished with ease. The patient tolerated the                         procedure well. Findings:      Salmon-colored mucosa was present. No other visible abnormalities were       present. Mucosa was biopsied with a cold forceps for histology.      The exam of the esophagus was otherwise normal.      Patchy mildly erythematous mucosa without bleeding was found in the       gastric antrum. Biopsies were taken with a cold forceps for histology.       Biopsies were obtained in the gastric body, at the incisura and in the       gastric antrum with cold forceps for histology.      The exam of the stomach was otherwise normal.      The duodenal bulb, second portion of the duodenum and examined duodenum       were normal. Impression:            - Salmon-colored mucosa classified as Barrett's stage                         C1-M1 per Prague criteria. Biopsied.                        - Erythematous mucosa in the antrum. Biopsied.                        - Normal duodenal bulb, second portion of the duodenum                         and examined duodenum.                        - Biopsies were obtained in the gastric body, at the                         incisura and in the gastric antrum. Recommendation:        - Take prescribed proton pump inhibitor or H2 blocker                         (  antacid) medications 30 - 60 minutes before meals.                        - Follow an antireflux regimen.                        - Await pathology results.                        - Discharge patient to home (with escort).                        - Advance diet as tolerated.                        - Continue present medications.                        - Patient has a contact number available for                         emergencies. The signs and symptoms of  potential                         delayed complications were discussed with the patient.                         Return to normal activities tomorrow. Written                         discharge instructions were provided to the patient.                        - Discharge patient to home (with escort).                        - The findings and recommendations were discussed with                         the patient.                        - The findings and recommendations were discussed with                         the patient's family. Procedure Code(s):     --- Professional ---                        [phone], Esophagogastroduodenoscopy, flexible,                         transoral; with biopsy, single or multiple Diagnosis Code(s):     --- Professional ---                        K22.70, Barrett's esophagus without dysplasia                        K31.89, Other diseases of stomach and duodenum  R12, Heartburn                        K21.00, Gastro-esophageal reflux disease with                         esophagitis, without bleeding CPT copyright 2019 American Medical Association. All rights reserved. The codes documented in this report are preliminary and upon coder review may  be revised to meet current compliance requirements.  [name], MD Margretta Sidle B. Bonna Gains MD, MD 08/25/2020 9:31:06 AM This report has been signed electronically. Number of Addenda: 0 Note Initiated On: 08/25/2020 9:04 AM Estimated Blood Loss:  Estimated blood loss: none.      [facility]

## 2020-08-25 NOTE — H&P (Signed)
Raymond Antigua, MD [address], [place], [place], [place], [address] [address], [place], [place], [place], [address] Phone: [phone]  Fax: [phone]  Primary Care Physician:  Lavera Guise, MD   Pre-Procedure History & Physical: HPI:  Raymond [name] is a 55 y.o. male is here for a colonoscopy and EGD.   Past Medical History:  Diagnosis Date  . Anxiety   . Arthritis    NECK  . Bronchitis   . Coronary artery disease   . Depression   . Diabetes mellitus without complication ([place])   . 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 Dyspnea    DUE TO GALLBLADDER PER PT  . Emphysema lung ([place])   . Family history of adverse reaction to anesthesia    N/V, TROUBLE BREATHING COMING OUT OF ANESTHESIA  . Fatty liver   . GERD (gastroesophageal reflux disease)   . Headache    MIGRAINES  . Hyperlipidemia   . Hypertension   . Irregular heart beat unk  . Myocardial infarction ([place]) 2010   MILD   . PTSD (post-traumatic stress disorder)   . Sleep apnea    USES CPAP  . Tachycardia     Past Surgical History:  Procedure Laterality Date  .  ingrown toenail removal    . CHOLECYSTECTOMY N/A 01/23/2019   Procedure: LAPAROSCOPIC CHOLECYSTECTOMY;  Surgeon: Olean Ree, MD;  Location: [facility] ORS;  Service: General;  Laterality: N/A;  . CLAVICLE SURGERY Right   . COLONOSCOPY  2017  . MOUTH SURGERY     REMOVED ALL TEETH  . SHOULDER SURGERY Right   . UPPER GI ENDOSCOPY  2017    Prior to Admission medications   Medication Sig Start Date End Date Taking? Authorizing Provider  albuterol (PROAIR HFA) 108 (90 Base) MCG/ACT inhaler Inhale 2-4 puffs by mouth Every 4-6 hours as needed for wheezing, cough, and/or shortness of breath 07/31/18  Yes Ronnell Freshwater, NP  aspirin EC 81 MG tablet Take 81 mg by mouth daily at 3 pm.  01/12/16  Yes [provider]  atorvastatin (LIPITOR) 10 MG tablet Take 1 tablet (10 mg total) by mouth daily. 03/11/20  Yes Boscia, Greer Ee, NP   buPROPion (WELLBUTRIN SR) 150 MG 12 hr tablet Take 1 tablet (150 mg total) by mouth 2 (two) times daily. 08/16/20  Yes Ursula Alert, MD  colestipol (COLESTID) 1 g tablet Take 2 tablets (2 g total) by mouth daily for 30 doses. 08/09/20 09/08/20 Yes Virgel Manifold, MD  DULoxetine (CYMBALTA) 60 MG capsule Take 1 capsule (60 mg total) by mouth daily. 08/16/20  Yes Ursula Alert, MD  famotidine (PEPCID) 20 MG tablet Take 1 tablet (20 mg total) by mouth 2 (two) times daily. 04/12/20  Yes Boscia, Greer Ee, NP  hydrOXYzine (ATARAX/VISTARIL) 10 MG tablet Take 1-2 tablets (10-20 mg total) by mouth at bedtime as needed. FOR SLEEP 08/16/20  Yes Eappen, Ria Clock, MD  insulin glargine, 2 Unit Dial, (TOUJEO MAX SOLOSTAR) 300 UNIT/ML Solostar Pen Inject 70 Units into the skin daily. Patient to increase dose to 70 units at bedtime 01/16/20  Yes Boscia, Heather E, NP  metoprolol tartrate (LOPRESSOR) 100 MG tablet Take 1 tablet (100 mg total) by mouth 2 (two) times daily. 04/12/20  Yes Boscia, Heather E, NP  mometasone-formoterol (DULERA) 100-5 MCG/ACT AERO Inhale 2 puffs into the lungs 2 (two) times daily. 05/14/19  Yes Scarboro, Audie Clear, NP  sitaGLIPtin-metformin (JANUMET) 50-500 MG tablet Take 1 tablet by mouth 2 (two)  times daily. 04/12/20  Yes Boscia, Heather E, NP  temazepam (RESTORIL) 30 MG capsule Take 1 capsule (30 mg total) by mouth at bedtime. 08/10/20  Yes Ursula Alert, MD  ACCU-CHEK FASTCLIX LANCETS MISC yse as directed twice a day 05/07/17   Ronnell Freshwater, NP  Continuous Blood Gluc Receiver (FREESTYLE LIBRE 2 READER) DEVI Use as directed to read blood sugar 4 times a day  Dx E11.65 07/13/20   Lavera Guise, MD  Continuous Blood Gluc Sensor (FREESTYLE LIBRE 14 DAY SENSOR) MISC 1 each by Does not apply route every 14 (fourteen) days. Apply to skin every 14 days E11.65 07/13/20   Lavera Guise, MD  dapagliflozin propanediol (FARXIGA) 10 MG TABS tablet Take 1 tablet (10 mg total) by mouth daily. Patient  not taking: Reported on 08/17/2020 04/09/20   Ronnell Freshwater, NP  glucose blood (ACCU-CHEK AVIVA PLUS) test strip Blood sugar testing TID and as needed. E11.65 01/16/20   Ronnell Freshwater, NP  insulin lispro (HUMALOG KWIKPEN) 100 UNIT/ML KwikPen Use as directed per  sliding scale max units 8-10 per meal Patient taking differently: Inject 12-15 Units into the skin 3 (three) times daily between meals. Sliding Scale Insulin 12/03/18   Leretha Pol E, NP  Insulin Pen Needle 32G X 4 MM MISC Use with daily dose insulin and with sliding scale insulin as needed. 01/16/20   Boscia, Greer Ee, NP  Na Sulfate-K Sulfate-Mg Sulf 17.5-3.13-1.6 GM/177ML SOLN At 5 PM the day before procedure take 1 bottle and 5 hours before procedure take 1 bottle. 08/09/20   Virgel Manifold, MD  nicotine polacrilex (NICORETTE) 4 MG gum Take 1 each (4 mg total) by mouth as needed for smoking cessation. 02/06/20   Ursula Alert, MD  nitroGLYCERIN (NITROSTAT) 0.4 MG SL tablet Place 1 tablet (0.4 mg total) under the tongue every 5 (five) minutes x 3 doses as needed for chest pain. 04/12/20   Ronnell Freshwater, NP  pantoprazole (PROTONIX) 40 MG tablet Take 1 tablet (40 mg total) by mouth daily. 04/12/20   Ronnell Freshwater, NP    Allergies as of 08/09/2020 - Review Complete 08/09/2020  Allergen Reaction Noted  . Onion Anaphylaxis 03/30/2015  . Hydrocodone  01/13/2014  . Norco [hydrocodone-acetaminophen] Itching 11/02/2014  . Hydrocodone-acetaminophen Itching 11/02/2014  . Nickel Rash 01/20/2019    Family History  Problem Relation Age of Onset  . Hypertension Mother   . Diabetes type II Mother   . Diabetes Mother   . COPD Father   . Cancer Father   . Heart disease Father   . Diabetes Sister   . Anxiety disorder Sister   . Depression Sister   . Diabetes Brother   . Anxiety disorder Brother   . Depression Brother   . Diabetes Maternal Aunt   . Diabetes Sister   . Anxiety disorder Sister   . Depression Sister    . Diabetes Sister   . Anxiety disorder Sister   . Depression Sister   . Diabetes Sister   . Anxiety disorder Sister   . Depression Sister   . Colon cancer Maternal Uncle     Social History   Socioeconomic History  . Marital status: Divorced    Spouse name: Not on file  . Number of children: 4  . Years of education: Not on file  . Highest education level: 8th grade  Occupational History  . Not on file  Tobacco Use  . Smoking status: Current Every Day Smoker  Packs/day: 1.00    Years: 40.00    Pack years: 40.00    Types: Cigarettes  . Smokeless tobacco: Former Systems developer    Types: Chew  . Tobacco comment: 1 pack per day reported 04/07/2020  Vaping Use  . Vaping Use: Former  Substance and Sexual Activity  . Alcohol use: No    Alcohol/week: 0.0 standard drinks  . Drug use: No  . Sexual activity: Not Currently  Other Topics Concern  . Not on file  Social History Narrative  . Not on file   Social Determinants of Health   Financial Resource Strain: Low Risk   . Difficulty of Paying Living Expenses: Not very hard  Food Insecurity: Not on file  Transportation Needs: Not on file  Physical Activity: Not on file  Stress: Not on file  Social Connections: Not on file  Intimate Partner Violence: Not on file    Review of Systems: See HPI, otherwise negative ROS  Physical Exam: BP (!) 146/83   Pulse 93   Temp (!) 97.3 F (36.3 C) (Temporal)   Resp 20   Ht 5\' 9"  (1.753 m)   Wt 105.7 kg   SpO2 98%   BMI 34.41 kg/m  General:   Alert,  pleasant and cooperative in NAD Head:  Normocephalic and atraumatic. Neck:  Supple; no masses or thyromegaly. Lungs:  Clear throughout to auscultation, normal respiratory effort.    Heart:  +S1, +S2, Regular rate and rhythm, No edema. Abdomen:  Soft, nontender and nondistended. Normal bowel sounds, without guarding, and without rebound.   Neurologic:  Alert and  oriented x4;  grossly normal neurologically.  Impression/Plan: GASPARE NETZEL is here for a colonoscopy to be performed for average risk screening and EGD for Acid Reflux, esophagitis.  Risks, benefits, limitations, and alternatives regarding the procedures have been reviewed with the patient.  Questions have been answered.  All parties agreeable.   Virgel Manifold, MD  08/25/2020, 9:17 AM

## 2020-08-25 NOTE — Anesthesia Procedure Notes (Signed)
Date/Time: 08/25/2020 9:10 AM Performed by: Doreen Salvage, CRNA Pre-anesthesia Checklist: Patient identified, Emergency Drugs available, Suction available and Patient being monitored Patient Re-evaluated:Patient Re-evaluated prior to induction Oxygen Delivery Method: Nasal cannula Induction Type: IV induction Dental Injury: Teeth and Oropharynx as per pre-operative assessment  Comments: Nasal cannula with etCO2 monitoring

## 2020-08-26 ENCOUNTER — Encounter: Payer: Self-pay | Admitting: Gastroenterology

## 2020-08-26 LAB — SURGICAL PATHOLOGY

## 2020-08-27 ENCOUNTER — Encounter: Payer: Self-pay | Admitting: Gastroenterology

## 2020-08-31 ENCOUNTER — Encounter: Payer: Self-pay | Admitting: Nurse Practitioner

## 2020-08-31 ENCOUNTER — Ambulatory Visit: Payer: Medicare Other | Admitting: Internal Medicine

## 2020-08-31 ENCOUNTER — Other Ambulatory Visit: Payer: Self-pay

## 2020-08-31 ENCOUNTER — Ambulatory Visit (INDEPENDENT_AMBULATORY_CARE_PROVIDER_SITE_OTHER): Payer: Medicare Other | Admitting: Nurse Practitioner

## 2020-08-31 VITALS — BP 132/88 | HR 87 | Temp 98.6°F | Resp 16 | Ht 69.0 in | Wt 234.8 lb

## 2020-08-31 DIAGNOSIS — I700 Atherosclerosis of aorta: Secondary | ICD-10-CM | POA: Diagnosis not present

## 2020-08-31 DIAGNOSIS — I10 Essential (primary) hypertension: Secondary | ICD-10-CM | POA: Diagnosis not present

## 2020-08-31 DIAGNOSIS — E782 Mixed hyperlipidemia: Secondary | ICD-10-CM | POA: Diagnosis not present

## 2020-08-31 DIAGNOSIS — E1165 Type 2 diabetes mellitus with hyperglycemia: Secondary | ICD-10-CM

## 2020-08-31 DIAGNOSIS — J449 Chronic obstructive pulmonary disease, unspecified: Secondary | ICD-10-CM | POA: Diagnosis not present

## 2020-08-31 DIAGNOSIS — K219 Gastro-esophageal reflux disease without esophagitis: Secondary | ICD-10-CM

## 2020-08-31 MED ORDER — PANTOPRAZOLE SODIUM 40 MG PO TBEC: 40.0000 mg | DELAYED_RELEASE_TABLET | Freq: Every day | ORAL | 1 refills | Status: DC

## 2020-08-31 NOTE — Progress Notes (Signed)
[facility] [place], [place] [address]  Internal MEDICINE  Office Visit Note  Patient Name: Raymond [name]  [id number]  [id number]  Date of Service: 09/06/2020  Chief Complaint  Patient presents with  . Arthritis  . Anxiety  . Depression  . Diabetes  . Hyperlipidemia  . Rash  . COPD    HPI Pt is here on routine visit and to F/u labs Elevated lipid panel ascvd 45.2% Drinks regular sodas. Nonadherent to diet.  Diabetes is uncontrolled as well. Has been on multiple meds  Recent low dose CT results are here as well   Current Medication: Outpatient Encounter Medications as of 08/31/2020  Medication Sig Note  . ACCU-CHEK FASTCLIX LANCETS MISC yse as directed twice a day   . albuterol (PROAIR HFA) 108 (90 Base) MCG/ACT inhaler Inhale 2-4 puffs by mouth Every 4-6 hours as needed for wheezing, cough, and/or shortness of breath   . aspirin EC 81 MG tablet Take 81 mg by mouth daily at 3 pm.    . buPROPion (WELLBUTRIN SR) 150 MG 12 hr tablet Take 1 tablet (150 mg total) by mouth 2 (two) times daily.   . colestipol (COLESTID) 1 g tablet Take 2 tablets (2 g total) by mouth daily for 30 doses.   . Continuous Blood Gluc Receiver (FREESTYLE LIBRE 2 READER) DEVI Use as directed to read blood sugar 4 times a day  Dx E11.65   . Continuous Blood Gluc Sensor (FREESTYLE LIBRE 14 DAY SENSOR) MISC 1 each by Does not apply route every 14 (fourteen) days. Apply to skin every 14 days E11.65   . DULoxetine (CYMBALTA) 60 MG capsule Take 1 capsule (60 mg total) by mouth daily.   . famotidine (PEPCID) 20 MG tablet Take 1 tablet (20 mg total) by mouth 2 (two) times daily.   Marland Kitchen glucose blood (ACCU-CHEK AVIVA PLUS) test strip Blood sugar testing TID and as needed. E11.65   . hydrOXYzine (ATARAX/VISTARIL) 10 MG tablet Take 1-2 tablets (10-20 mg total) by mouth at bedtime as needed. FOR SLEEP   . insulin glargine, 2 Unit Dial, (TOUJEO MAX SOLOSTAR) 300 UNIT/ML Solostar Pen Inject 70  Units into the skin daily. Patient to increase dose to 70 units at bedtime   . Insulin Pen Needle 32G X 4 MM MISC Use with daily dose insulin and with sliding scale insulin as needed.   . metoprolol tartrate (LOPRESSOR) 100 MG tablet Take 1 tablet (100 mg total) by mouth 2 (two) times daily.   . mometasone-formoterol (DULERA) 100-5 MCG/ACT AERO Inhale 2 puffs into the lungs 2 (two) times daily.   . Na Sulfate-K Sulfate-Mg Sulf 17.5-3.13-1.6 GM/177ML SOLN At 5 PM the day before procedure take 1 bottle and 5 hours before procedure take 1 bottle.   . nicotine polacrilex (NICORETTE) 4 MG gum Take 1 each (4 mg total) by mouth as needed for smoking cessation. 08/09/2020: Not taking due to cost of rx.    . nitroGLYCERIN (NITROSTAT) 0.4 MG SL tablet Place 1 tablet (0.4 mg total) under the tongue every 5 (five) minutes x 3 doses as needed for chest pain.   Marland Kitchen temazepam (RESTORIL) 30 MG capsule Take 1 capsule (30 mg total) by mouth at bedtime.   . [DISCONTINUED] atorvastatin (LIPITOR) 10 MG tablet Take 1 tablet (10 mg total) by mouth daily.   . [DISCONTINUED] dapagliflozin propanediol (FARXIGA) 10 MG TABS tablet Take 1 tablet (10 mg total) by mouth daily.   . [DISCONTINUED] insulin lispro (  HUMALOG KWIKPEN) 100 UNIT/ML KwikPen Use as directed per  sliding scale max units 8-10 per meal (Patient taking differently: Inject 12-15 Units into the skin 3 (three) times daily between meals. Sliding Scale Insulin)   . [DISCONTINUED] pantoprazole (PROTONIX) 40 MG tablet Take 1 tablet (40 mg total) by mouth daily.   . [DISCONTINUED] sitaGLIPtin-metformin (JANUMET) 50-500 MG tablet Take 1 tablet by mouth 2 (two) times daily.   . pantoprazole (PROTONIX) 40 MG tablet Take 1 tablet (40 mg total) by mouth daily.    No facility-administered encounter medications on file as of 08/31/2020.    Surgical History: Past Surgical History:  Procedure Laterality Date  .  ingrown toenail removal    . CHOLECYSTECTOMY N/A 01/23/2019    Procedure: LAPAROSCOPIC CHOLECYSTECTOMY;  Surgeon: Olean Ree, MD;  Location: [facility] ORS;  Service: General;  Laterality: N/A;  . CLAVICLE SURGERY Right   . COLONOSCOPY  2017  . COLONOSCOPY WITH PROPOFOL N/A 08/25/2020   Procedure: COLONOSCOPY WITH PROPOFOL;  Surgeon: Virgel Manifold, MD;  Location: [facility] ENDOSCOPY;  Service: Endoscopy;  Laterality: N/A;  . ESOPHAGOGASTRODUODENOSCOPY (EGD) WITH PROPOFOL N/A 08/25/2020   Procedure: ESOPHAGOGASTRODUODENOSCOPY (EGD) WITH PROPOFOL;  Surgeon: Virgel Manifold, MD;  Location: [facility] ENDOSCOPY;  Service: Endoscopy;  Laterality: N/A;  . MOUTH SURGERY     REMOVED ALL TEETH  . SHOULDER SURGERY Right   . UPPER GI ENDOSCOPY  2017    Medical History: Past Medical History:  Diagnosis Date  . Anxiety   . Arthritis    NECK  . Bronchitis   . Coronary artery disease   . Depression   . Diabetes mellitus without complication ([place])   . 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 Dyspnea    DUE TO GALLBLADDER PER PT  . Emphysema lung ([place])   . Family history of adverse reaction to anesthesia    N/V, TROUBLE BREATHING COMING OUT OF ANESTHESIA  . Fatty liver   . GERD (gastroesophageal reflux disease)   . Headache    MIGRAINES  . Hyperlipidemia   . Hypertension   . Irregular heart beat unk  . Myocardial infarction ([place]) 2010   MILD   . PTSD (post-traumatic stress disorder)   . Sleep apnea    USES CPAP  . Tachycardia     Family History: Family History  Problem Relation Age of Onset  . Hypertension Mother   . Diabetes type II Mother   . Diabetes Mother   . COPD Father   . Cancer Father   . Heart disease Father   . Diabetes Sister   . Anxiety disorder Sister   . Depression Sister   . Diabetes Brother   . Anxiety disorder Brother   . Depression Brother   . Diabetes Maternal Aunt   . Diabetes Sister   . Anxiety disorder Sister   . Depression Sister   . Diabetes Sister   . Anxiety disorder Sister   . Depression Sister   .  Diabetes Sister   . Anxiety disorder Sister   . Depression Sister   . Colon cancer Maternal Uncle     Social History   Socioeconomic History  . Marital status: Divorced    Spouse name: Not on file  . Number of children: 4  . Years of education: Not on file  . Highest education level: 8th grade  Occupational History  . Not on file  Tobacco Use  . Smoking status: Current Every Day Smoker    Packs/day: 1.00  Years: 40.00    Pack years: 40.00    Types: Cigarettes  . Smokeless tobacco: Former Systems developer    Types: Chew  . Tobacco comment: 1 pack per day reported 04/07/2020  Vaping Use  . Vaping Use: Former  Substance and Sexual Activity  . Alcohol use: No    Alcohol/week: 0.0 standard drinks  . Drug use: No  . Sexual activity: Not Currently  Other Topics Concern  . Not on file  Social History Narrative  . Not on file   Social Determinants of Health   Financial Resource Strain: Low Risk   . Difficulty of Paying Living Expenses: Not very hard  Food Insecurity: Not on file  Transportation Needs: Not on file  Physical Activity: Not on file  Stress: Not on file  Social Connections: Not on file  Intimate Partner Violence: Not on file      Review of Systems  Constitutional: Negative for chills, fatigue and unexpected weight change.  HENT: Negative for congestion, postnasal drip, rhinorrhea, sneezing and sore throat.   Eyes: Negative for redness.  Respiratory: Negative for cough, chest tightness and shortness of breath.   Cardiovascular: Negative for chest pain and palpitations.  Gastrointestinal: Negative for abdominal pain, constipation, diarrhea, nausea and vomiting.  Genitourinary: Negative for dysuria and frequency.  Musculoskeletal: Negative for arthralgias, back pain, joint swelling and neck pain.  Skin: Negative for rash.  Neurological: Negative.  Negative for tremors and numbness.  Hematological: Negative for adenopathy. Does not bruise/bleed easily.   Psychiatric/Behavioral: Negative for behavioral problems (Depression), sleep disturbance and suicidal ideas. The patient is not nervous/anxious.     Vital Signs: BP 132/88   Pulse 87   Temp 98.6 F (37 C)   Resp 16   Ht 5\' 9"  (1.753 m)   Wt 234 lb 12.8 oz (106.5 kg)   SpO2 95%   BMI 34.67 kg/m    Physical Exam Constitutional:      General: He is not in acute distress.    Appearance: He is well-developed. He is not diaphoretic.  HENT:     Head: Normocephalic and atraumatic.     Mouth/Throat:     Pharynx: No oropharyngeal exudate.  Eyes:     Pupils: Pupils are equal, round, and reactive to light.  Neck:     Thyroid: No thyromegaly.     Vascular: No JVD.     Trachea: No tracheal deviation.  Cardiovascular:     Rate and Rhythm: Normal rate and regular rhythm.     Heart sounds: Normal heart sounds. No murmur heard. No friction rub. No gallop.   Pulmonary:     Effort: Pulmonary effort is normal. No respiratory distress.     Breath sounds: No wheezing or rales.  Chest:     Chest wall: No tenderness.  Abdominal:     General: Bowel sounds are normal.     Palpations: Abdomen is soft.  Musculoskeletal:        General: Normal range of motion.     Cervical back: Normal range of motion and neck supple.  Lymphadenopathy:     Cervical: No cervical adenopathy.  Skin:    General: Skin is warm and dry.  Neurological:     Mental Status: He is alert and oriented to person, place, and time.     Cranial Nerves: No cranial nerve deficit.  Psychiatric:        Behavior: Behavior normal.        Thought Content: Thought content normal.  Judgment: Judgment normal.      Assessment/Plan:  1. Uncontrolled type 2 diabetes mellitus with hyperglycemia (HCC) Samples - farxiga 10mg  daily  Trulicity injection every Tuesday ( Titrate 0.75 to 1.50 toujeo 70 units with supper dail, decrease to 60 units, DC humalog as well  Stop janumet Keep log of numbers   2. Benign  hypertension Continue all meds as before   3. Mixed hyperlipidemia Pt will need to be on a statin   4. Gastroesophageal reflux disease without esophagitis - pantoprazole (PROTONIX) 40 MG tablet; Take 1 tablet (40 mg total) by mouth daily.  Dispense: 90 tablet; Refill: 1  5. Aortic atherosclerosis (HCC) Pt will need to be on a statin  6. Chronic obstructive pulmonary disease, unspecified COPD type ([place]) CT results are as follows 1. Lung-RADS 2, benign appearance or behavior. Continue annual screening with low-dose chest CT without contrast in 12 months. 2. One vessel coronary atherosclerosis. 3. Small hiatal hernia. 4. Aortic Atherosclerosis (ICD10-I70.0) and Emphysema (ICD10-J43.9).  General Counseling: Davarius verbalizes understanding of the findings of todays visit and agrees with plan of treatment. I have discussed any further diagnostic evaluation that may be needed or ordered today. We also reviewed his medications today. he has been encouraged to call the office with any questions or concerns that should arise related to todays visit.      Meds ordered this encounter  Medications  . pantoprazole (PROTONIX) 40 MG tablet    Sig: Take 1 tablet (40 mg total) by mouth daily.    Dispense:  90 tablet    Refill:  1    Total time spent:40 Minutes Time spent includes review of chart, medications, test results, and follow up plan with the patient.   [place] Controlled Substance Database was reviewed by me.   Dr Lavera Guise Internal medicine

## 2020-09-01 ENCOUNTER — Telehealth: Payer: Self-pay

## 2020-09-01 NOTE — Telephone Encounter (Signed)
Pt walk in today that his fasting was 68  As per dr Humphrey Rolls advised him to take 60 units at units at night and see how he is doing and call us back

## 2020-09-02 ENCOUNTER — Telehealth: Payer: Self-pay

## 2020-09-02 NOTE — Telephone Encounter (Signed)
Pt called c/o his sugar dropping down to 68 last night and at 70 this morning.  Per DFK I called pt to check up on him and at the time his Bs is now 200.  Informed pt that if he had anymore issues to let us know.  At this time per DFK pt just needs to monitor his bs.

## 2020-09-03 ENCOUNTER — Telehealth (INDEPENDENT_AMBULATORY_CARE_PROVIDER_SITE_OTHER): Payer: Medicare Other | Admitting: Psychiatry

## 2020-09-03 ENCOUNTER — Other Ambulatory Visit: Payer: Self-pay

## 2020-09-03 ENCOUNTER — Encounter: Payer: Self-pay | Admitting: Psychiatry

## 2020-09-03 DIAGNOSIS — F17200 Nicotine dependence, unspecified, uncomplicated: Secondary | ICD-10-CM

## 2020-09-03 DIAGNOSIS — F3342 Major depressive disorder, recurrent, in full remission: Secondary | ICD-10-CM | POA: Diagnosis not present

## 2020-09-03 DIAGNOSIS — F5105 Insomnia due to other mental disorder: Secondary | ICD-10-CM

## 2020-09-03 DIAGNOSIS — F4310 Post-traumatic stress disorder, unspecified: Secondary | ICD-10-CM

## 2020-09-03 NOTE — Progress Notes (Signed)
Virtual Visit via Video Note  I connected with Raymond [name] on 09/03/20 at  9:40 AM EDT by a video enabled telemedicine application and verified that I am speaking with the correct person using two identifiers.  Location Provider Location : [facility] Patient Location : Home  Participants: Patient , Provider   I discussed the limitations of evaluation and management by telemedicine and the availability of in person appointments. The patient expressed understanding and agreed to proceed.   I discussed the assessment and treatment plan with the patient. The patient was provided an opportunity to ask questions and all were answered. The patient agreed with the plan and demonstrated an understanding of the instructions.   The patient was advised to call back or seek an in-person evaluation if the symptoms worsen or if the condition fails to improve as anticipated.   [place] MD OP Progress Note  09/03/2020 10:02 AM SHAWNDALE KILPATRICK  MRN:  [id number]  Chief Complaint:  Chief Complaint    Follow-up; Anxiety; Insomnia; Nicotine Dependence     HPI: Raymond [name] is a 55 year old male, divorced, has a history of MDD, PTSD, tobacco use disorder insomnia, neuropathy, uncontrolled diabetes, hyperlipidemia, hepatic steatosis, lives in [place] was evaluated by telemedicine today.  Patient reports he has noticed an improvement in his sleep.  He does wake up in between however he is able to take the hydroxyzine and he is able to fall back asleep quicker.  He wakes up 1-2 times at night.  He denies any side effects to temazepam.  Patient reports he has been trying to cut back on smoking and the Wellbutrin increased dosage helps to some extent.  He denies side effects.  Patient denies any significant sadness.  He is anxious about his health in general, does have continued neuropathy pain of his foot as well as uncontrolled diabetes.  That does make him anxious.  Patient reports he is trying to work  with his providers on same.  The Cymbalta has helped to some extent with the pain.  Patient denies any suicidality, homicidality or perceptual disturbances.  Patient denies any other concerns today.  Visit Diagnosis:    ICD-10-CM   1. MDD (major depressive disorder), recurrent, in full remission ([place])  F33.42   2. PTSD (post-traumatic stress disorder)  F43.10   3. Tobacco use disorder  F17.200   4. Insomnia due to mental disorder  F51.05     Past Psychiatric History: I have reviewed past psychiatric history from progress note on 05/23/2018.  Past trials of Prozac, Zoloft, Celexa, Tegretol, doxepin, Klonopin, Seroquel, mirtazapine, Belsomra, Lunesta, Ativan, Rozerem, Lexapro  Past Medical History:  Past Medical History:  Diagnosis Date  . Anxiety   . Arthritis    NECK  . Bronchitis   . Coronary artery disease   . Depression   . Diabetes mellitus without complication ([place])   . 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 Dyspnea    DUE TO GALLBLADDER PER PT  . Emphysema lung ([place])   . Family history of adverse reaction to anesthesia    N/V, TROUBLE BREATHING COMING OUT OF ANESTHESIA  . Fatty liver   . GERD (gastroesophageal reflux disease)   . Headache    MIGRAINES  . Hyperlipidemia   . Hypertension   . Irregular heart beat unk  . Myocardial infarction ([place]) 2010   MILD   . PTSD (post-traumatic stress disorder)   . Sleep apnea    USES CPAP  . Tachycardia  Past Surgical History:  Procedure Laterality Date  .  ingrown toenail removal    . CHOLECYSTECTOMY N/A 01/23/2019   Procedure: LAPAROSCOPIC CHOLECYSTECTOMY;  Surgeon: Olean Ree, MD;  Location: [facility] ORS;  Service: General;  Laterality: N/A;  . CLAVICLE SURGERY Right   . COLONOSCOPY  2017  . COLONOSCOPY WITH PROPOFOL N/A 08/25/2020   Procedure: COLONOSCOPY WITH PROPOFOL;  Surgeon: Virgel Manifold, MD;  Location: [facility] ENDOSCOPY;  Service: Endoscopy;  Laterality: N/A;  . ESOPHAGOGASTRODUODENOSCOPY (EGD) WITH  PROPOFOL N/A 08/25/2020   Procedure: ESOPHAGOGASTRODUODENOSCOPY (EGD) WITH PROPOFOL;  Surgeon: Virgel Manifold, MD;  Location: [facility] ENDOSCOPY;  Service: Endoscopy;  Laterality: N/A;  . MOUTH SURGERY     REMOVED ALL TEETH  . SHOULDER SURGERY Right   . UPPER GI ENDOSCOPY  2017    Family Psychiatric History: I have reviewed family psychiatric history from my progress note on 05/23/2018.  Family History:  Family History  Problem Relation Age of Onset  . Hypertension Mother   . Diabetes type II Mother   . Diabetes Mother   . COPD Father   . Cancer Father   . Heart disease Father   . Diabetes Sister   . Anxiety disorder Sister   . Depression Sister   . Diabetes Brother   . Anxiety disorder Brother   . Depression Brother   . Diabetes Maternal Aunt   . Diabetes Sister   . Anxiety disorder Sister   . Depression Sister   . Diabetes Sister   . Anxiety disorder Sister   . Depression Sister   . Diabetes Sister   . Anxiety disorder Sister   . Depression Sister   . Colon cancer Maternal Uncle     Social History: Reviewed social history from progress note on 05/23/2018. Social History   Socioeconomic History  . Marital status: Divorced    Spouse name: Not on file  . Number of children: 4  . Years of education: Not on file  . Highest education level: 8th grade  Occupational History  . Not on file  Tobacco Use  . Smoking status: Current Every Day Smoker    Packs/day: 1.00    Years: 40.00    Pack years: 40.00    Types: Cigarettes  . Smokeless tobacco: Former Systems developer    Types: Chew  . Tobacco comment: 1 pack per day reported 04/07/2020  Vaping Use  . Vaping Use: Former  Substance and Sexual Activity  . Alcohol use: No    Alcohol/week: 0.0 standard drinks  . Drug use: No  . Sexual activity: Not Currently  Other Topics Concern  . Not on file  Social History Narrative  . Not on file   Social Determinants of Health   Financial Resource Strain: Low Risk   . Difficulty of  Paying Living Expenses: Not very hard  Food Insecurity: Not on file  Transportation Needs: Not on file  Physical Activity: Not on file  Stress: Not on file  Social Connections: Not on file    Allergies:  Allergies  Allergen Reactions  . Onion Anaphylaxis  . Hydrocodone   . Norco [Hydrocodone-Acetaminophen] Itching  . Hydrocodone-Acetaminophen Itching  . Nickel Rash    Metabolic Disorder Labs: Lab Results  Component Value Date   HGBA1C 10.7 (A) 07/12/2020   MPG 294.83 05/31/2017   No results found for: PROLACTIN Lab Results  Component Value Date   CHOL 280 (H) 08/17/2020   TRIG 501 (H) 08/17/2020   HDL 31 (L) 08/17/2020  CHOLHDL 4.8 07/15/2015   VLDL 26 12/09/2012   LDLCALC 153 (H) 08/17/2020   LDLCALC 61 07/15/2015   Lab Results  Component Value Date   TSH 1.890 08/17/2020   TSH 4.120 06/10/2018    Therapeutic Level Labs: No results found for: LITHIUM No results found for: VALPROATE No components found for:  CBMZ  Current Medications: Current Outpatient Medications  Medication Sig Dispense Refill  . ACCU-CHEK FASTCLIX LANCETS MISC yse as directed twice a day 100 each 1  . albuterol (PROAIR HFA) 108 (90 Base) MCG/ACT inhaler Inhale 2-4 puffs by mouth Every 4-6 hours as needed for wheezing, cough, and/or shortness of breath 3 Inhaler 1  . aspirin EC 81 MG tablet Take 81 mg by mouth daily at 3 pm.     . buPROPion (WELLBUTRIN SR) 150 MG 12 hr tablet Take 1 tablet (150 mg total) by mouth 2 (two) times daily. 60 tablet 1  . colestipol (COLESTID) 1 g tablet Take 2 tablets (2 g total) by mouth daily for 30 doses. 60 tablet 2  . Continuous Blood Gluc Receiver (FREESTYLE LIBRE 2 READER) DEVI Use as directed to read blood sugar 4 times a day  Dx E11.65 1 each 5  . Continuous Blood Gluc Sensor (FREESTYLE LIBRE 14 DAY SENSOR) MISC 1 each by Does not apply route every 14 (fourteen) days. Apply to skin every 14 days E11.65 6 each 12  . DULoxetine (CYMBALTA) 60 MG capsule  Take 1 capsule (60 mg total) by mouth daily. 30 capsule 1  . famotidine (PEPCID) 20 MG tablet Take 1 tablet (20 mg total) by mouth 2 (two) times daily. 180 tablet 1  . glucose blood (ACCU-CHEK AVIVA PLUS) test strip Blood sugar testing TID and as needed. E11.65 100 each 12  . hydrOXYzine (ATARAX/VISTARIL) 10 MG tablet Take 1-2 tablets (10-20 mg total) by mouth at bedtime as needed. FOR SLEEP 30 tablet 2  . insulin glargine, 2 Unit Dial, (TOUJEO MAX SOLOSTAR) 300 UNIT/ML Solostar Pen Inject 70 Units into the skin daily. Patient to increase dose to 70 units at bedtime 10 mL 3  . Insulin Pen Needle 32G X 4 MM MISC Use with daily dose insulin and with sliding scale insulin as needed. 100 each 5  . metoprolol tartrate (LOPRESSOR) 100 MG tablet Take 1 tablet (100 mg total) by mouth 2 (two) times daily. 180 tablet 1  . mometasone-formoterol (DULERA) 100-5 MCG/ACT AERO Inhale 2 puffs into the lungs 2 (two) times daily. 13 g 2  . Na Sulfate-K Sulfate-Mg Sulf 17.5-3.13-1.6 GM/177ML SOLN At 5 PM the day before procedure take 1 bottle and 5 hours before procedure take 1 bottle. 354 mL 0  . nicotine polacrilex (NICORETTE) 4 MG gum Take 1 each (4 mg total) by mouth as needed for smoking cessation. 100 tablet 0  . nitroGLYCERIN (NITROSTAT) 0.4 MG SL tablet Place 1 tablet (0.4 mg total) under the tongue every 5 (five) minutes x 3 doses as needed for chest pain. 30 tablet 1  . pantoprazole (PROTONIX) 40 MG tablet Take 1 tablet (40 mg total) by mouth daily. 90 tablet 1  . temazepam (RESTORIL) 30 MG capsule Take 1 capsule (30 mg total) by mouth at bedtime. 30 capsule 2   No current facility-administered medications for this visit.     Musculoskeletal: Strength & Muscle Tone: UTA Gait & Station: UTA Patient leans: N/A  Psychiatric Specialty Exam: Review of Systems  Musculoskeletal:       BL foot pain  Psychiatric/Behavioral:  The patient is nervous/anxious.   All other systems reviewed and are negative.    There were no vitals taken for this visit.There is no height or weight on file to calculate BMI.  General Appearance: Casual  Eye Contact:  Fair  Speech:  Clear and Coherent  Volume:  Normal  Mood:  Anxious Coping well  Affect:  Congruent  Thought Process:  Goal Directed and Descriptions of Associations: Intact  Orientation:  Full (Time, Place, and Person)  Thought Content: Logical   Suicidal Thoughts:  No  Homicidal Thoughts:  No  Memory:  Immediate;   Fair Recent;   Fair Remote;   Fair  Judgement:  Fair  Insight:  Fair  Psychomotor Activity:  Normal  Concentration:  Concentration: Fair and Attention Span: Fair  Recall:  [employer] of Knowledge: Fair  Language: Fair  Akathisia:  No  Handed:  Right  AIMS (if indicated): UTA  Assets:  Communication Skills Desire for Improvement Housing Social Support  ADL's:  Intact  Cognition: WNL  Sleep:  Fair   Screenings: Mini-Mental   Flowsheet Row Clinical Support from 10/17/2019 in [facility], [place] from 10/08/2018 in [facility], [place] from 10/05/2017 in [facility], [facility]  Total Score (max 30 points ) 30 27 30     PHQ2-9   [place] Visit from 08/31/2020 in [facility], [facility] Office Visit from 07/12/2020 in [facility], [facility] Video Visit from 06/23/2020 in [place] Visit from 04/12/2020 in [facility], [facility] Office Visit from 01/16/2020 in [facility], [place]  PHQ-2 Total Score 0 0 1 0 0    Flowsheet Row Admission (Discharged) from 08/25/2020 in [place] Video Visit from 06/23/2020 in [place] No Risk Low Risk       Assessment and Plan: Raymond [name] is a 55 year old Caucasian male, lives in [place], divorced, disabled, has a history of PTSD, MDD, insomnia, diabetes mellitus, history of  vision loss, neuropathy was evaluated by telemedicine today.Patient with psychosocial stressors of several deaths in the family, his own health problems.  Patient is currently making progress however continues to struggle with pain as well as uncontrolled diabetes and will benefit from management of the same.  He will continue to follow-up with his providers.  Plan as noted below.  Plan  MDD in remission Cymbalta 60 mg daily. Wellbutrin as prescribed  Insomnia-improving Temazepam 30 mg p.o. nightly Continue CPAP for OSA Hydroxyzine 10 to 20 mg p.o. nightly as needed  PTSD-stable Cymbalta 60 mg p.o. daily  Tobacco use disorder-improving Wellbutrin SR 150 mg p.o. twice daily   Patient will follow up with his primary provider as well as is planning to schedule an appointment with podiatry.  He will benefit from sufficient pain management.  Follow-up in clinic in 2 months or sooner in office.  This note was generated in part or whole with voice recognition software. Voice recognition is usually quite accurate but there are transcription errors that can and very often do occur. I apologize for any typographical errors that were not detected and corrected.       Ursula Alert, MD 09/03/2020, 10:02 AM

## 2020-09-06 ENCOUNTER — Encounter: Payer: Self-pay | Admitting: Internal Medicine

## 2020-09-06 ENCOUNTER — Other Ambulatory Visit: Payer: Self-pay

## 2020-09-06 ENCOUNTER — Ambulatory Visit: Payer: Medicare Other | Admitting: Internal Medicine

## 2020-09-06 VITALS — BP 130/84 | HR 85 | Temp 97.8°F | Resp 16 | Ht 69.0 in | Wt 231.0 lb

## 2020-09-06 DIAGNOSIS — I5032 Chronic diastolic (congestive) heart failure: Secondary | ICD-10-CM | POA: Diagnosis not present

## 2020-09-06 DIAGNOSIS — J449 Chronic obstructive pulmonary disease, unspecified: Secondary | ICD-10-CM

## 2020-09-06 DIAGNOSIS — R0602 Shortness of breath: Secondary | ICD-10-CM

## 2020-09-06 DIAGNOSIS — F17210 Nicotine dependence, cigarettes, uncomplicated: Secondary | ICD-10-CM | POA: Diagnosis not present

## 2020-09-06 NOTE — Patient Instructions (Signed)
Chronic Obstructive Pulmonary Disease  Chronic obstructive pulmonary disease (COPD) is a long-term (chronic) lung problem. When you have COPD, it is hard for air to get in and out of your lungs. Usually the condition gets worse over time, and your lungs will never return to normal. There are things you can do to keep yourself as healthy as possible. What are the causes?  Smoking. This is the most common cause.  Certain genes passed from parent to child (inherited). What increases the risk?  Being exposed to secondhand smoke from cigarettes, pipes, or cigars.  Being exposed to chemicals and other irritants, such as fumes and dust in the work environment.  Having chronic lung conditions or infections. What are the signs or symptoms?  Shortness of breath, especially during physical activity.  A long-term cough with a large amount of thick mucus. Sometimes, the cough may not have any mucus (dry cough).  Wheezing.  Breathing quickly.  Skin that looks gray or blue, especially in the fingers, toes, or lips.  Feeling tired (fatigue).  Weight loss.  Chest tightness.  Having infections often.  Episodes when breathing symptoms become much worse (exacerbations). At the later stages of this disease, you may have swelling in the ankles, feet, or legs. How is this treated?  Taking medicines.  Quitting smoking, if you smoke.  Rehabilitation. This includes steps to make your body work better. It may involve a team of specialists.  Doing exercises.  Making changes to your diet.  Using oxygen.  Lung surgery.  Lung transplant.  Comfort measures (palliative care). Follow these instructions at home: Medicines  Take over-the-counter and prescription medicines only as told by your doctor.  Talk to your doctor before taking any cough or allergy medicines. You may need to avoid medicines that cause your lungs to be dry. Lifestyle  If you smoke, stop smoking. Smoking makes the  problem worse.  Do not smoke or use any products that contain nicotine or tobacco. If you need help quitting, ask your doctor.  Avoid being around things that make your breathing worse. This may include smoke, chemicals, and fumes.  Stay active, but remember to rest as well.  Learn and use tips on how to manage stress and control your breathing.  Make sure you get enough sleep. Most adults need at least 7 hours of sleep every night.  Eat healthy foods. Eat smaller meals more often. Rest before meals. Controlled breathing Learn and use tips on how to control your breathing as told by your doctor. Try:  Breathing in (inhaling) through your nose for 1 second. Then, pucker your lips and breath out (exhale) through your lips for 2 seconds.  Putting one hand on your belly (abdomen). Breathe in slowly through your nose for 1 second. Your hand on your belly should move out. Pucker your lips and breathe out slowly through your lips. Your hand on your belly should move in as you breathe out.   Controlled coughing Learn and use controlled coughing to clear mucus from your lungs. Follow these steps: 1. Lean your head a little forward. 2. Breathe in deeply. 3. Try to hold your breath for 3 seconds. 4. Keep your mouth slightly open while coughing 2 times. 5. Spit any mucus out into a tissue. 6. Rest and do the steps again 1 or 2 times as needed. General instructions  Make sure you get all the shots (vaccines) that your doctor recommends. Ask your doctor about a flu shot and a pneumonia shot.    Use oxygen therapy and pulmonary rehabilitation if told by your doctor. If you need home oxygen therapy, ask your doctor if you should buy a tool to measure your oxygen level (oximeter).  Make a COPD action plan with your doctor. This helps you to know what to do if you feel worse than usual.  Manage any other conditions you have as told by your doctor.  Avoid going outside when it is very hot, cold, or  humid.  Avoid people who have a sickness you can catch (contagious).  Keep all follow-up visits. Contact a doctor if:  You cough up more mucus than usual.  There is a change in the color or thickness of the mucus.  It is harder to breathe than usual.  Your breathing is faster than usual.  You have trouble sleeping.  You need to use your medicines more often than usual.  You have trouble doing your normal activities such as getting dressed or walking around the house. Get help right away if:  You have shortness of breath while resting.  You have shortness of breath that stops you from: ? Being able to talk. ? Doing normal activities.  Your chest hurts for longer than 5 minutes.  Your skin color is more blue than usual.  Your pulse oximeter shows that you have low oxygen for longer than 5 minutes.  You have a fever.  You feel too tired to breathe normally. These symptoms may represent a serious problem that is an emergency. Do not wait to see if the symptoms will go away. Get medical help right away. Call your local emergency services (911 in the [place] Do not drive yourself to the hospital. Summary  Chronic obstructive pulmonary disease (COPD) is a long-term lung problem.  The way your lungs work will never return to normal. Usually the condition gets worse over time. There are things you can do to keep yourself as healthy as possible.  Take over-the-counter and prescription medicines only as told by your doctor.  If you smoke, stop. Smoking makes the problem worse. This information is not intended to replace advice given to you by your health care provider. Make sure you discuss any questions you have with your health care provider. Document Revised: 02/17/2020 Document Reviewed: 02/17/2020 Elsevier Patient Education  2021 [employer]   

## 2020-09-06 NOTE — Progress Notes (Signed)
[facility] [place], [place] [address]  Pulmonary Sleep Medicine   Office Visit Note  Patient Name: Raymond [name] DOB: May 08, 1965 MRN [id number]  Date of Service: 09/06/2020  Complaints/HPI: He had PFT done which was normal. Patient is smoking 1-2 PPD. He has some cough occasionally has SOB with exertion. He states he is taking inhalers rescue and maintenance. Patient states that he has no CP palpitaitons. He does have Chronic diastolic dydsfunction last echo was done in 2020 he may have some worsening. The PFT was normal  ROS  General: (-) fever, (-) chills, (-) night sweats, (-) weakness Skin: (-) rashes, (-) itching,. Eyes: (-) visual changes, (-) redness, (-) itching. Nose and Sinuses: (-) nasal stuffiness or itchiness, (-) postnasal drip, (-) nosebleeds, (-) sinus trouble. Mouth and Throat: (-) sore throat, (-) hoarseness. Neck: (-) swollen glands, (-) enlarged thyroid, (-) neck pain. Respiratory: + cough, (-) bloody sputum, + shortness of breath, - wheezing. Cardiovascular: +ankle swelling, (-) chest pain. Lymphatic: (-) lymph node enlargement. Neurologic: (-) numbness, (-) tingling. Psychiatric: (-) anxiety, (-) depression   Current Medication: Outpatient Encounter Medications as of 09/06/2020  Medication Sig Note  . ACCU-CHEK FASTCLIX LANCETS MISC yse as directed twice a day   . albuterol (PROAIR HFA) 108 (90 Base) MCG/ACT inhaler Inhale 2-4 puffs by mouth Every 4-6 hours as needed for wheezing, cough, and/or shortness of breath   . aspirin EC 81 MG tablet Take 81 mg by mouth daily at 3 pm.    . buPROPion (WELLBUTRIN SR) 150 MG 12 hr tablet Take 1 tablet (150 mg total) by mouth 2 (two) times daily.   . colestipol (COLESTID) 1 g tablet Take 2 tablets (2 g total) by mouth daily for 30 doses.   . Continuous Blood Gluc Receiver (FREESTYLE LIBRE 2 READER) DEVI Use as directed to read blood sugar 4 times a day  Dx E11.65   . Continuous Blood Gluc  Sensor (FREESTYLE LIBRE 14 DAY SENSOR) MISC 1 each by Does not apply route every 14 (fourteen) days. Apply to skin every 14 days E11.65   . DULoxetine (CYMBALTA) 60 MG capsule Take 1 capsule (60 mg total) by mouth daily.   . famotidine (PEPCID) 20 MG tablet Take 1 tablet (20 mg total) by mouth 2 (two) times daily.   Marland Kitchen glucose blood (ACCU-CHEK AVIVA PLUS) test strip Blood sugar testing TID and as needed. E11.65   . hydrOXYzine (ATARAX/VISTARIL) 10 MG tablet Take 1-2 tablets (10-20 mg total) by mouth at bedtime as needed. FOR SLEEP   . insulin glargine, 2 Unit Dial, (TOUJEO MAX SOLOSTAR) 300 UNIT/ML Solostar Pen Inject 70 Units into the skin daily. Patient to increase dose to 70 units at bedtime   . Insulin Pen Needle 32G X 4 MM MISC Use with daily dose insulin and with sliding scale insulin as needed.   . metoprolol tartrate (LOPRESSOR) 100 MG tablet Take 1 tablet (100 mg total) by mouth 2 (two) times daily.   . mometasone-formoterol (DULERA) 100-5 MCG/ACT AERO Inhale 2 puffs into the lungs 2 (two) times daily.   . Na Sulfate-K Sulfate-Mg Sulf 17.5-3.13-1.6 GM/177ML SOLN At 5 PM the day before procedure take 1 bottle and 5 hours before procedure take 1 bottle.   . nicotine polacrilex (NICORETTE) 4 MG gum Take 1 each (4 mg total) by mouth as needed for smoking cessation. 08/09/2020: Not taking due to cost of rx.    . nitroGLYCERIN (NITROSTAT) 0.4 MG SL tablet Place  1 tablet (0.4 mg total) under the tongue every 5 (five) minutes x 3 doses as needed for chest pain.   . pantoprazole (PROTONIX) 40 MG tablet Take 1 tablet (40 mg total) by mouth daily.   . temazepam (RESTORIL) 30 MG capsule Take 1 capsule (30 mg total) by mouth at bedtime.   . [DISCONTINUED] atorvastatin (LIPITOR) 10 MG tablet Take 1 tablet (10 mg total) by mouth daily.    No facility-administered encounter medications on file as of 09/06/2020.    Surgical History: Past Surgical History:  Procedure Laterality Date  .  ingrown toenail  removal    . CHOLECYSTECTOMY N/A 01/23/2019   Procedure: LAPAROSCOPIC CHOLECYSTECTOMY;  Surgeon: Olean Ree, MD;  Location: [facility] ORS;  Service: General;  Laterality: N/A;  . CLAVICLE SURGERY Right   . COLONOSCOPY  2017  . COLONOSCOPY WITH PROPOFOL N/A 08/25/2020   Procedure: COLONOSCOPY WITH PROPOFOL;  Surgeon: Virgel Manifold, MD;  Location: [facility] ENDOSCOPY;  Service: Endoscopy;  Laterality: N/A;  . ESOPHAGOGASTRODUODENOSCOPY (EGD) WITH PROPOFOL N/A 08/25/2020   Procedure: ESOPHAGOGASTRODUODENOSCOPY (EGD) WITH PROPOFOL;  Surgeon: Virgel Manifold, MD;  Location: [facility] ENDOSCOPY;  Service: Endoscopy;  Laterality: N/A;  . MOUTH SURGERY     REMOVED ALL TEETH  . SHOULDER SURGERY Right   . UPPER GI ENDOSCOPY  2017    Medical History: Past Medical History:  Diagnosis Date  . Anxiety   . Arthritis    NECK  . Bronchitis   . Coronary artery disease   . Depression   . Diabetes mellitus without complication ([place])   . 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 Dyspnea    DUE TO GALLBLADDER PER PT  . Emphysema lung ([place])   . Family history of adverse reaction to anesthesia    N/V, TROUBLE BREATHING COMING OUT OF ANESTHESIA  . Fatty liver   . GERD (gastroesophageal reflux disease)   . Headache    MIGRAINES  . Hyperlipidemia   . Hypertension   . Irregular heart beat unk  . Myocardial infarction ([place]) 2010   MILD   . PTSD (post-traumatic stress disorder)   . Sleep apnea    USES CPAP  . Tachycardia     Family History: Family History  Problem Relation Age of Onset  . Hypertension Mother   . Diabetes type II Mother   . Diabetes Mother   . COPD Father   . Cancer Father   . Heart disease Father   . Diabetes Sister   . Anxiety disorder Sister   . Depression Sister   . Diabetes Brother   . Anxiety disorder Brother   . Depression Brother   . Diabetes Maternal Aunt   . Diabetes Sister   . Anxiety disorder Sister   . Depression Sister   . Diabetes Sister   . Anxiety  disorder Sister   . Depression Sister   . Diabetes Sister   . Anxiety disorder Sister   . Depression Sister   . Colon cancer Maternal Uncle     Social History: Social History   Socioeconomic History  . Marital status: Divorced    Spouse name: Not on file  . Number of children: 4  . Years of education: Not on file  . Highest education level: 8th grade  Occupational History  . Not on file  Tobacco Use  . Smoking status: Current Every Day Smoker    Packs/day: 1.00    Years: 40.00    Pack years: 40.00    Types:  Cigarettes  . Smokeless tobacco: Former Systems developer    Types: Chew  . Tobacco comment: 1 pack per day reported 04/07/2020  Vaping Use  . Vaping Use: Former  Substance and Sexual Activity  . Alcohol use: No    Alcohol/week: 0.0 standard drinks  . Drug use: No  . Sexual activity: Not Currently  Other Topics Concern  . Not on file  Social History Narrative  . Not on file   Social Determinants of Health   Financial Resource Strain: Low Risk   . Difficulty of Paying Living Expenses: Not very hard  Food Insecurity: Not on file  Transportation Needs: Not on file  Physical Activity: Not on file  Stress: Not on file  Social Connections: Not on file  Intimate Partner Violence: Not on file    Vital Signs: Blood pressure 130/84, pulse 85, temperature 97.8 F (36.6 C), resp. rate 16, height 5' 9" (1.753 m), weight 231 lb (104.8 kg), SpO2 97 %.  Examination: General Appearance: The patient is well-developed, well-nourished, and in no distress. Skin: Gross inspection of skin unremarkable. Head: normocephalic, no gross deformities. Eyes: no gross deformities noted. ENT: ears appear grossly normal no exudates. Neck: Supple. No thyromegaly. No LAD. Respiratory: few rhonchi noted. Cardiovascular: Normal S1 and S2 without murmur or rub. Extremities: No cyanosis. pulses are equal. Edema noted Neurologic: Alert and oriented. No involuntary movements.  LABS: Recent Results  (from the past 2160 hour(s))  POCT HgB A1C     Status: Abnormal   Collection Time: 07/12/20  9:03 AM  Result Value Ref Range   Hemoglobin A1C 10.7 (A) 4.0 - 5.6 %   HbA1c POC (<> result, manual entry)     HbA1c, POC (prediabetic range)     HbA1c, POC (controlled diabetic range)    Pulmonary function test     Status: None   Collection Time: 08/11/20 10:00 AM  Result Value Ref Range   FEV1     FVC     FEV1/FVC     TLC     DLCO    CBC w/Diff/Platelet     Status: None   Collection Time: 08/17/20 10:03 AM  Result Value Ref Range   WBC 8.5 3.4 - 10.8 x10E3/uL   RBC 5.11 4.14 - 5.80 x10E6/uL   Hemoglobin 15.6 13.0 - 17.7 g/dL   Hematocrit 45.9 37.5 - 51.0 %   MCV 90 79 - 97 fL   MCH 30.5 26.6 - 33.0 pg   MCHC 34.0 31.5 - 35.7 g/dL   RDW 13.1 11.6 - 15.4 %   Platelets 213 150 - 450 x10E3/uL   Neutrophils 52 Not Estab. %   Lymphs 36 Not Estab. %   Monocytes 8 Not Estab. %   Eos 2 Not Estab. %   Basos 1 Not Estab. %   Neutrophils Absolute 4.5 1.4 - 7.0 x10E3/uL   Lymphocytes Absolute 3.1 0.7 - 3.1 x10E3/uL   Monocytes Absolute 0.7 0.1 - 0.9 x10E3/uL   EOS (ABSOLUTE) 0.2 0.0 - 0.4 x10E3/uL   Basophils Absolute 0.1 0.0 - 0.2 x10E3/uL   Immature Granulocytes 1 Not Estab. %   Immature Grans (Abs) 0.1 0.0 - 0.1 x10E3/uL  Comprehensive Metabolic Panel (CMET)     Status: Abnormal   Collection Time: 08/17/20 10:03 AM  Result Value Ref Range   Glucose 400 (H) 65 - 99 mg/dL   BUN 14 6 - 24 mg/dL   Creatinine, Ser 1.06 0.76 - 1.27 mg/dL   eGFR 83 >59 mL/min/1.73  BUN/Creatinine Ratio 13 9 - 20   Sodium 133 (L) 134 - 144 mmol/L   Potassium 5.1 3.5 - 5.2 mmol/L   Chloride 96 96 - 106 mmol/L   CO2 22 20 - 29 mmol/L   Calcium 10.2 8.7 - 10.2 mg/dL   Total Protein 7.6 6.0 - 8.5 g/dL   Albumin 3.9 3.8 - 4.9 g/dL   Globulin, Total 3.7 1.5 - 4.5 g/dL   Albumin/Globulin Ratio 1.1 (L) 1.2 - 2.2   Bilirubin Total 0.2 0.0 - 1.2 mg/dL   Alkaline Phosphatase 143 (H) 44 - 121 IU/L   AST 33 0  - 40 IU/L   ALT 52 (H) 0 - 44 IU/L  Lipid Panel With LDL/HDL Ratio     Status: Abnormal   Collection Time: 08/17/20 10:03 AM  Result Value Ref Range   Cholesterol, Total 280 (H) 100 - 199 mg/dL   Triglycerides 501 (H) 0 - 149 mg/dL   HDL 31 (L) >39 mg/dL   VLDL Cholesterol Cal 96 (H) 5 - 40 mg/dL   LDL Chol Calc (NIH) 153 (H) 0 - 99 mg/dL   LDL/HDL Ratio 4.9 (H) 0.0 - 3.6 ratio    Comment:                                     LDL/HDL Ratio                                             Men  Women                               1/2 Avg.Risk  1.0    1.5                                   Avg.Risk  3.6    3.2                                2X Avg.Risk  6.2    5.0                                3X Avg.Risk  8.0    6.1   TSH + free T4     Status: None   Collection Time: 08/17/20 10:03 AM  Result Value Ref Range   TSH 1.890 0.450 - 4.500 uIU/mL   Free T4 1.08 0.82 - 1.77 ng/dL  PSA, total and free     Status: None   Collection Time: 08/17/20 10:03 AM  Result Value Ref Range   Prostate Specific Ag, Serum 1.0 0.0 - 4.0 ng/mL    Comment: Roche ECLIA methodology. According to the American Urological Association, Serum PSA should decrease and remain at undetectable levels after radical prostatectomy. The AUA defines biochemical recurrence as an initial PSA value 0.2 ng/mL or greater followed by a subsequent confirmatory PSA value 0.2 ng/mL or greater. Values obtained with different assay methods or kits cannot be used interchangeably. Results cannot be interpreted as absolute evidence of the presence or absence of malignant disease.  PSA, Free 0.18 N/A ng/mL    Comment: Roche ECLIA methodology.   PSA, Free Pct 18.0 %    Comment: The table below lists the probability of prostate cancer for men with non-suspicious DRE results and total PSA between 4 and 10 ng/mL, by patient age Ricci Barker, [place], [id number]).                   % Free PSA       50-64 yr        65-75 yr                    0.00-10.00%        56%             55%                  10.01-15.00%        24%             35%                  15.01-20.00%        17%             23%                  20.01-25.00%        10%             20%                       >25.00%         5%              9% Please note:  Catalona et al did not make specific               recommendations regarding the use of               percent free PSA for any other population               of men.   Glucose, capillary     Status: Abnormal   Collection Time: 08/25/20  8:59 AM  Result Value Ref Range   Glucose-Capillary 214 (H) 70 - 99 mg/dL    Comment: Glucose reference range applies only to samples taken after fasting for at least 8 hours.  Surgical pathology     Status: None   Collection Time: 08/25/20  9:04 AM  Result Value Ref Range   SURGICAL PATHOLOGY      SURGICAL PATHOLOGY CASE: [id number] PATIENT: Kameron Billey Surgical Pathology Report     Specimen Submitted: A. Stomach; cbx B. Esophagus; cbx C. Colon polyp, sigmoid; cold snare  Clinical History: History of polyps Z86.010 Barrett's screening K22.70. Gastric erythema, Barrett's    DIAGNOSIS: A. STOMACH; COLD BIOPSY: - GASTRIC ANTRAL MUCOSA WITH FEATURES OF REACTIVE GASTROPATHY. - GASTRIC OXYNTIC MUCOSA WITH FEATURES OF MILD PPI EFFECT. - NEGATIVE FOR H. PYLORI, DYSPLASIA, AND MALIGNANCY.  Comment: The differential diagnosis for these findings includes drug/chemical injury (NSAID vs. other), bile reflux, and changes adjacent to an area of healing ulceration. Clinical correlation with endoscopic findings is required.  B. ESOPHAGUS; COLD BIOPSY: - SQUAMOCOLUMNAR MUCOSA WITH INTESTINAL METAPLASIA AND FEATURES OF REFLUX GASTROESOPHAGITIS. - NEGATIVE FOR DYSPLASIA AND MALIGNANCY.  Comment: The biopsy shows gastric type mucosa with scattered goblet ce lls. The diagnosis in this case depends on the location of this biopsy.  If this biopsy was taken from the  tubular esophagus at least 1 cm above the gastric fold it shows Barrett's mucosa of the distinctive type. If this biopsy was taken from the gastric cardia it shows intestinal metaplasia of the gastric cardia.  C. COLON POLYP, SIGMOID; COLD SNARE: - TUBULAR ADENOMA. - NEGATIVE FOR HIGH-GRADE DYSPLASIA AND MALIGNANCY.  GROSS DESCRIPTION: A. Labeled: cbx gastric for H. pylori Received: Formalin Collection time: 9:04 AM on 08/25/2020 Placed into formalin time: 9:04 AM on 08/25/2020 Tissue fragment(s): 4 Size: Aggregate, 1.2 x 0.5 x 0.2 cm Description: Tan-pink soft tissue fragments Entirely submitted in 1 cassette.  B. Labeled: cbx esophageal for Barrett's Received: Formalin Collection time: 9:23 AM on 08/25/2020 Placed into formalin time: 9:23 AM on 08/25/2020 Tissue fragment(s): Multiple Size: Aggregate, 1.7 x 0.4 x 0.2 cm Description: Tan-pink soft tissue fragment s Entirely submitted in 1 cassette.  C. Labeled: Cold snared sigmoid colon polyp Received: Formalin Collection time: 9:50 AM on 08/25/2020 Placed into formalin time: 9:50 AM on 08/25/2020 Tissue fragment(s): 1 Size: 0.4 x 0.3 x 0.2 cm Description: Tan soft tissue fragment Entirely submitted in 1 cassette.  RB 08/25/2020  Final Diagnosis performed by Allena Napoleon, MD.   Electronically signed 08/26/2020 9:03:45AM The electronic signature indicates that the named Attending Pathologist has evaluated the specimen Technical component performed at [facility], [address], [place], [place] [address] Lab: [phone] Dir: Rush Farmer, MD, MMM  Professional component performed at [facility], [facility], [address], [place], [place] [address] Lab: [phone] Dir: Dellia Nims. Reuel Derby, MD     Radiology: CT CHEST LUNG CA SCREEN LOW DOSE W/O CM  Result Date: 08/11/2020 CLINICAL DATA:  55 year old asymptomatic male current smoker with 40 pack-year smoking history. EXAM: CT CHEST WITHOUT CONTRAST LOW-DOSE FOR LUNG  CANCER SCREENING TECHNIQUE: Multidetector CT imaging of the chest was performed following the standard protocol without IV contrast. COMPARISON:  07/22/2017 chest CT. FINDINGS: Cardiovascular: Normal heart size. No significant pericardial effusion/thickening. Left anterior descending coronary atherosclerosis. Atherosclerotic nonaneurysmal thoracic aorta. Top-normal caliber main pulmonary artery (3.2 cm diameter). Mediastinum/Nodes: No discrete thyroid nodules. Unremarkable esophagus. No pathologically enlarged axillary, mediastinal or hilar lymph nodes, noting limited sensitivity for the detection of hilar adenopathy on this noncontrast study. Lungs/Pleura: No pneumothorax. No pleural effusion. Mild centrilobular and paraseptal emphysema with diffuse bronchial wall thickening. No acute consolidative airspace disease or lung masses. Mild patchy confluent centrilobular ground-glass micronodularity in both lungs is mildly increased, most prominent in the posterior upper right lung, compatible with smoking related respiratory bronchiolitis. A few scattered small solid pulmonary nodules, largest 4.5 mm in volume derived mean diameter in the left lower lobe (series 3/image 232). Upper abdomen: Small hiatal hernia. Musculoskeletal:  No aggressive appearing focal osseous lesions. IMPRESSION: 1. Lung-RADS 2, benign appearance or behavior. Continue annual screening with low-dose chest CT without contrast in 12 months. 2. One vessel coronary atherosclerosis. 3. Small hiatal hernia. 4. Aortic Atherosclerosis (ICD10-I70.0) and Emphysema (ICD10-J43.9). Electronically Signed   By: Ilona Sorrel M.D.   On: 08/11/2020 17:14    No results found.  CT CHEST LUNG CA SCREEN LOW DOSE W/O CM  Result Date: 08/11/2020 CLINICAL DATA:  55 year old asymptomatic male current smoker with 40 pack-year smoking history. EXAM: CT CHEST WITHOUT CONTRAST LOW-DOSE FOR LUNG CANCER SCREENING TECHNIQUE: Multidetector CT imaging of the chest was  performed following the standard protocol without IV contrast. COMPARISON:  07/22/2017 chest CT. FINDINGS: Cardiovascular: Normal heart size. No significant pericardial effusion/thickening. Left anterior descending coronary  atherosclerosis. Atherosclerotic nonaneurysmal thoracic aorta. Top-normal caliber main pulmonary artery (3.2 cm diameter). Mediastinum/Nodes: No discrete thyroid nodules. Unremarkable esophagus. No pathologically enlarged axillary, mediastinal or hilar lymph nodes, noting limited sensitivity for the detection of hilar adenopathy on this noncontrast study. Lungs/Pleura: No pneumothorax. No pleural effusion. Mild centrilobular and paraseptal emphysema with diffuse bronchial wall thickening. No acute consolidative airspace disease or lung masses. Mild patchy confluent centrilobular ground-glass micronodularity in both lungs is mildly increased, most prominent in the posterior upper right lung, compatible with smoking related respiratory bronchiolitis. A few scattered small solid pulmonary nodules, largest 4.5 mm in volume derived mean diameter in the left lower lobe (series 3/image 232). Upper abdomen: Small hiatal hernia. Musculoskeletal:  No aggressive appearing focal osseous lesions. IMPRESSION: 1. Lung-RADS 2, benign appearance or behavior. Continue annual screening with low-dose chest CT without contrast in 12 months. 2. One vessel coronary atherosclerosis. 3. Small hiatal hernia. 4. Aortic Atherosclerosis (ICD10-I70.0) and Emphysema (ICD10-J43.9). Electronically Signed   By: Ilona Sorrel M.D.   On: 08/11/2020 17:14      Assessment and Plan: Patient Active Problem List   Diagnosis Date Noted  . Barrett's esophagus without dysplasia   . Gastric erythema   . Special screening for malignant neoplasms, colon   . Polyp of colon   . MDD (major depressive disorder), recurrent episode, mild ([place]) 06/23/2020  . Noncompliance with diabetes treatment 04/12/2020  . Angina pectoris ([place])  04/12/2020  . MDD (major depressive disorder), recurrent, in full remission ([place]) 01/05/2020  . MDD (major depressive disorder), recurrent, in partial remission ([place]) 10/30/2019  . Tobacco use disorder 07/02/2019  . Right upper quadrant pain   . Encounter for other preprocedural examination 01/20/2019  . PTSD (post-traumatic stress disorder) 01/20/2019  . MDD (major depressive disorder), recurrent episode, moderate ([place]) 01/20/2019  . Insomnia due to mental disorder 01/20/2019  . Calculus of gallbladder with acute on chronic cholecystitis without obstruction 11/15/2018  . Generalized abdominal pain 10/08/2018  . Local superficial swelling, mass or lump 10/08/2018  . Enterocolitis 08/16/2018  . Left upper quadrant abdominal pain of unknown etiology 08/16/2018  . Diastasis recti 06/19/2018  . Generalized anxiety disorder 04/01/2018  . Clotted blood in stool 12/31/2017  . Diarrhea 12/31/2017  . Encounter for general adult medical examination with abnormal findings 10/27/2017  . Uncontrolled type 2 diabetes mellitus with hyperglycemia ([place]) 10/27/2017  . Onychomycosis with ingrown toenail 10/27/2017  . Dysuria 10/27/2017  . Uncontrolled type 2 diabetes mellitus with hypoglycemia without coma ([place]) 07/08/2017  . Shortness of breath 07/08/2017  . Cigarette nicotine dependence without complication [id number]/[id number]  . Influenza A 05/30/2017  . OSA on CPAP 08/12/2015  . Cervical spondylosis 06/14/2015  . Essential hypertension 04/08/2015  . Depression 04/08/2015  . Gastroesophageal reflux disease without esophagitis 01/05/2015  . 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 Shortness of breath Multifactorial with a history of COPD likely contributing in addition patient does have a history of chronic diastolic dysfunction will need to be further worked up.  Will discuss further with his primary care team.  2. Chronic obstructive pulmonary disease, unspecified COPD type  ([place]) Inhaler should be continued he does get some relief  3. Cigarette nicotine dependence without complication Smoking cessation discussed at length  4. Chronic diastolic heart failure (HCC) congestive heart failure Management of fluid was discussed also discussed the salt intake issues.  May benefit from reassessment of his medications after echocardiogram is done - ECHOCARDIOGRAM COMPLETE; Future   General  Counseling: I have discussed the findings of the evaluation and examination with Raymond [name].  I have also discussed any further diagnostic evaluation thatmay be needed or ordered today. B[place]laus verbalizes understanding of the findings of todays visit. We also reviewed his medications today and discussed drug interactions and side effects including but not limited excessive drowsiness and altered mental states. We also discussed that there is always a risk not just to him but also people around him. he has been encouraged to call the office with any questions or concerns that should arise related to todays visit.  No orders of the defined types were placed in this encounter.    Time spent: 56  I have personally obtained a history, examined the patient, evaluated laboratory and imaging results, formulated the assessment and plan and placed orders.    Allyne Gee, MD [facility] Pulmonary and Critical Care Sleep medicine

## 2020-09-10 ENCOUNTER — Other Ambulatory Visit: Payer: Self-pay | Admitting: Nurse Practitioner

## 2020-09-17 ENCOUNTER — Telehealth: Payer: Self-pay | Admitting: Pharmacist

## 2020-09-17 NOTE — Progress Notes (Addendum)
Chronic Care Management Pharmacy Assistant   Name: Raymond [name]  MRN: [id number] DOB: 09-Dec-1965  Reason for Encounter: Disease State For DM   Conditions to be addressed/monitored: HTN, GERD, Type II DM w/neuropathy, Depression, Tobacco use disorder  Recent office visits:  09/06/20 Allyne Gee, MD. For follow-up. No medication changes.  08/31/20 Jonetta Osgood, NP. For follow-up. STOPPED Atorvastatin, Dapagliflozin, Sitagliptin-Metformin, Insulin Lispro.  Recent consult visits:  09/03/20 Behavioral Video Visit Ursula Alert, MD. No information given.  Hospital visits:  08/25/20 [facility] (2 Hours) Virgel Manifold, MD. Per note: EGD. No medication changes.   Medications: Outpatient Encounter Medications as of 09/17/2020  Medication Sig Note   ACCU-CHEK FASTCLIX LANCETS MISC yse as directed twice a day    albuterol (PROAIR HFA) 108 (90 Base) MCG/ACT inhaler Inhale 2-4 puffs by mouth Every 4-6 hours as needed for wheezing, cough, and/or shortness of breath    aspirin EC 81 MG tablet Take 81 mg by mouth daily at 3 pm.     buPROPion (WELLBUTRIN SR) 150 MG 12 hr tablet Take 1 tablet (150 mg total) by mouth 2 (two) times daily.    colestipol (COLESTID) 1 g tablet Take 2 tablets (2 g total) by mouth daily for 30 doses.    Continuous Blood Gluc Receiver (FREESTYLE LIBRE 2 READER) DEVI Use as directed to read blood sugar 4 times a day  Dx E11.65    Continuous Blood Gluc Sensor (FREESTYLE LIBRE 14 DAY SENSOR) MISC 1 each by Does not apply route every 14 (fourteen) days. Apply to skin every 14 days E11.65    DULoxetine (CYMBALTA) 60 MG capsule Take 1 capsule (60 mg total) by mouth daily.    famotidine (PEPCID) 20 MG tablet Take 1 tablet (20 mg total) by mouth 2 (two) times daily.    glucose blood (ACCU-CHEK AVIVA PLUS) test strip Blood sugar testing TID and as needed. E11.65    hydrOXYzine (ATARAX/VISTARIL) 10 MG tablet Take 1-2 tablets (10-20 mg total) by  mouth at bedtime as needed. FOR SLEEP    insulin glargine, 2 Unit Dial, (TOUJEO MAX SOLOSTAR) 300 UNIT/ML Solostar Pen Inject 70 Units into the skin daily. Patient to increase dose to 70 units at bedtime    Insulin Pen Needle 32G X 4 MM MISC Use with daily dose insulin and with sliding scale insulin as needed.    metoprolol tartrate (LOPRESSOR) 100 MG tablet Take 1 tablet (100 mg total) by mouth 2 (two) times daily.    mometasone-formoterol (DULERA) 100-5 MCG/ACT AERO Inhale 2 puffs into the lungs 2 (two) times daily.    Na Sulfate-K Sulfate-Mg Sulf 17.5-3.13-1.6 GM/177ML SOLN At 5 PM the day before procedure take 1 bottle and 5 hours before procedure take 1 bottle.    nicotine polacrilex (NICORETTE) 4 MG gum Take 1 each (4 mg total) by mouth as needed for smoking cessation. 08/09/2020: Not taking due to cost of rx.     nitroGLYCERIN (NITROSTAT) 0.4 MG SL tablet Place 1 tablet (0.4 mg total) under the tongue every 5 (five) minutes x 3 doses as needed for chest pain.    pantoprazole (PROTONIX) 40 MG tablet Take 1 tablet (40 mg total) by mouth daily.    temazepam (RESTORIL) 30 MG capsule Take 1 capsule (30 mg total) by mouth at bedtime.    [DISCONTINUED] atorvastatin (LIPITOR) 10 MG tablet Take 1 tablet (10 mg total) by mouth daily.    No facility-administered encounter medications on file as  of 09/17/2020.    Recent Relevant Labs: Lab Results  Component Value Date/Time   HGBA1C 10.7 (A) 07/12/2020 09:03 AM   HGBA1C 11.1 (A) 04/12/2020 11:08 AM   HGBA1C 11.9 (H) 05/31/2017 04:49 AM   HGBA1C 8.5 (H) 07/15/2015 06:32 PM   HGBA1C 6.2 12/09/2012 05:30 AM    Kidney Function Lab Results  Component Value Date/Time   CREATININE 1.06 08/17/2020 10:03 AM   CREATININE 1.05 03/09/2020 12:48 PM   CREATININE 1.25 11/28/2013 03:06 PM   CREATININE 1.05 07/31/2013 07:50 PM   GFRNONAA >60 03/09/2020 12:48 PM   GFRNONAA >60 11/28/2013 03:06 PM   GFRAA >60 01/25/2019 06:23 AM   GFRAA >60 11/28/2013 03:06  PM    Current antihyperglycemic regimen:  Trulicity - titrating Farxiga 10mg  - samples Toujeo 60 units hs  What recent interventions/DTPs have been made to improve glycemic control:  None  Have there been any recent hospitalizations or ED visits since last visit with CPP? Yes, documented above.   Patient reports hypoglycemic symptoms, including None    Patient denies hyperglycemic symptoms, including weakness   How often are you checking your blood sugar? Patient stated once daily and 3-4 times daily   What are your blood sugars ranging? 118 09/16/20  During the week, how often does your blood glucose drop below 70?  Patient stated on 09/13/20 his blood sugar dropped below 70. He stated it was between 66-69.   Are you checking your feet daily/regularly?  Patient stated he checks his food regularly.   Adherence Review: Is the patient currently on a STATIN medication? None.  Is the patient currently on ACE/ARB medication? None.  Does the patient have >5 day gap between last estimated fill dates? N/A.  Star Rating Drugs: N/A  Follow-Up: Pharmacist Review  Charlann Lange, RMA Clinical Pharmacist Assistant [phone]  10 minutes spent in review, coordination, and documentation.  Reviewed by: Beverly Milch, PharmD Clinical Pharmacist [facility] [phone]

## 2020-09-18 ENCOUNTER — Other Ambulatory Visit: Payer: Self-pay | Admitting: Nurse Practitioner

## 2020-09-18 DIAGNOSIS — K219 Gastro-esophageal reflux disease without esophagitis: Secondary | ICD-10-CM

## 2020-09-21 ENCOUNTER — Ambulatory Visit (INDEPENDENT_AMBULATORY_CARE_PROVIDER_SITE_OTHER): Payer: Medicare Other | Admitting: Nurse Practitioner

## 2020-09-21 ENCOUNTER — Encounter: Payer: Self-pay | Admitting: Nurse Practitioner

## 2020-09-21 ENCOUNTER — Other Ambulatory Visit: Payer: Self-pay

## 2020-09-21 VITALS — BP 118/82 | HR 80 | Temp 98.5°F | Resp 16 | Ht 69.0 in | Wt 229.7 lb

## 2020-09-21 DIAGNOSIS — F3342 Major depressive disorder, recurrent, in full remission: Secondary | ICD-10-CM

## 2020-09-21 DIAGNOSIS — J439 Emphysema, unspecified: Secondary | ICD-10-CM

## 2020-09-21 DIAGNOSIS — E1165 Type 2 diabetes mellitus with hyperglycemia: Secondary | ICD-10-CM

## 2020-09-21 DIAGNOSIS — I10 Essential (primary) hypertension: Secondary | ICD-10-CM | POA: Diagnosis not present

## 2020-09-21 DIAGNOSIS — F4310 Post-traumatic stress disorder, unspecified: Secondary | ICD-10-CM

## 2020-09-21 DIAGNOSIS — E782 Mixed hyperlipidemia: Secondary | ICD-10-CM

## 2020-09-21 DIAGNOSIS — K219 Gastro-esophageal reflux disease without esophagitis: Secondary | ICD-10-CM | POA: Diagnosis not present

## 2020-09-21 DIAGNOSIS — F5105 Insomnia due to other mental disorder: Secondary | ICD-10-CM | POA: Diagnosis not present

## 2020-09-21 MED ORDER — BREZTRI AEROSPHERE 160-9-4.8 MCG/ACT IN AERO: 2.0000 | INHALATION_SPRAY | Freq: Two times a day (BID) | RESPIRATORY_TRACT | 11 refills | Status: DC

## 2020-09-21 MED ORDER — ATORVASTATIN CALCIUM 40 MG PO TABS: 40.0000 mg | ORAL_TABLET | Freq: Every day | ORAL | 3 refills | Status: DC

## 2020-09-21 MED ORDER — HYDROXYZINE HCL 10 MG PO TABS: 10.0000 mg | ORAL_TABLET | Freq: Every evening | ORAL | 2 refills | Status: DC | PRN

## 2020-09-21 MED ORDER — DAPAGLIFLOZIN PROPANEDIOL 10 MG PO TABS
10.0000 mg | ORAL_TABLET | Freq: Every day | ORAL | 1 refills | Status: DC
Start: 2020-09-21 — End: 2020-12-07

## 2020-09-21 MED ORDER — EZETIMIBE 10 MG PO TABS: 10.0000 mg | ORAL_TABLET | Freq: Every day | ORAL | 2 refills | Status: DC

## 2020-09-21 MED ORDER — DULOXETINE HCL 60 MG PO CPEP: 60.0000 mg | ORAL_CAPSULE | Freq: Every day | ORAL | 2 refills | Status: DC

## 2020-09-21 MED ORDER — TOUJEO MAX SOLOSTAR 300 UNIT/ML ~~LOC~~ SOPN: 50.0000 [IU] | PEN_INJECTOR | Freq: Every day | SUBCUTANEOUS | 3 refills | Status: DC

## 2020-09-21 MED ORDER — DULAGLUTIDE 1.5 MG/0.5ML ~~LOC~~ SOAJ: 1.5000 mg | SUBCUTANEOUS | 1 refills | Status: DC

## 2020-09-21 NOTE — Progress Notes (Signed)
[facility] [address], [place] [address]  Internal MEDICINE  Office Visit Note  Patient Name: Raymond [name]  [id number]  [id number]  Date of Service: 09/26/2020  Chief Complaint  Patient presents with  . Follow-up    Med review, pain in lower back the moves to the front rt side of stomach     HPI  Almalik presents for a follow up visit to review medications, diabetes, and low back pain. He has been experiencing right side low back pain, radiates to RUQ, denies history of kidney stones.  -His last A1C was 10.7 in late march 2022. Will recheck A1C at follow up visit.Marland Kitchen He has been taking Toujeo, Trulicity, and [place]. He is now using the Freestyle Libre glucose monitor and he is able to keep track of his glucose levels better and remembers to check it more often than he did before. He recently had to derease his Toujeo dose from 70 to 60 units because he glucose levels started dropping too low.  -He reports that his breathing is not doing very well. He states that the inhalers he is on are not helping and he still has to use his rescue inhaler almost daily.  -Estimated 10-year ASCVD risk is 39.7%, He is currently on atorvastatin 10 mg which is not considered optimal therapy for his current ASCVD risk.     Current Medication: Outpatient Encounter Medications as of 09/21/2020  Medication Sig Note  . ACCU-CHEK FASTCLIX LANCETS MISC yse as directed twice a day   . albuterol (PROAIR HFA) 108 (90 Base) MCG/ACT inhaler Inhale 2-4 puffs by mouth Every 4-6 hours as needed for wheezing, cough, and/or shortness of breath   . aspirin EC 81 MG tablet Take 81 mg by mouth daily at 3 pm.    . atorvastatin (LIPITOR) 40 MG tablet Take 1 tablet (40 mg total) by mouth daily.   . Budeson-Glycopyrrol-Formoterol (BREZTRI AEROSPHERE) 160-9-4.8 MCG/ACT AERO Inhale 2 puffs into the lungs 2 (two) times daily.   Marland Kitchen buPROPion (WELLBUTRIN SR) 150 MG 12 hr tablet Take 1 tablet (150 mg total)  by mouth 2 (two) times daily.   . colestipol (COLESTID) 1 g tablet Take 2 tablets (2 g total) by mouth daily for 30 doses.   . Continuous Blood Gluc Receiver (FREESTYLE LIBRE 2 READER) DEVI Use as directed to read blood sugar 4 times a day  Dx E11.65   . Continuous Blood Gluc Sensor (FREESTYLE LIBRE 14 DAY SENSOR) MISC 1 each by Does not apply route every 14 (fourteen) days. Apply to skin every 14 days E11.65   . dapagliflozin propanediol (FARXIGA) 10 MG TABS tablet Take 1 tablet (10 mg total) by mouth daily before breakfast.   . Dulaglutide 1.5 MG/0.5ML SOPN Inject 1.5 mg into the skin once a week.   . ezetimibe (ZETIA) 10 MG tablet Take 1 tablet (10 mg total) by mouth daily.   . famotidine (PEPCID) 20 MG tablet Take 1 tablet (20 mg total) by mouth 2 (two) times daily.   Marland Kitchen glucose blood (ACCU-CHEK AVIVA PLUS) test strip Blood sugar testing TID and as needed. E11.65   . Insulin Pen Needle 32G X 4 MM MISC Use with daily dose insulin and with sliding scale insulin as needed.   Marland Kitchen MELATONIN PO Take 10 mg by mouth daily.   . metoprolol tartrate (LOPRESSOR) 100 MG tablet Take 1 tablet (100 mg total) by mouth 2 (two) times daily.   . nitroGLYCERIN (NITROSTAT) 0.4 MG SL  tablet Place 1 tablet (0.4 mg total) under the tongue every 5 (five) minutes x 3 doses as needed for chest pain.   . pantoprazole (PROTONIX) 40 MG tablet Take 1 tablet (40 mg total) by mouth daily.   . temazepam (RESTORIL) 30 MG capsule Take 1 capsule (30 mg total) by mouth at bedtime.   . [DISCONTINUED] DULoxetine (CYMBALTA) 60 MG capsule Take 1 capsule (60 mg total) by mouth daily.   . [DISCONTINUED] hydrOXYzine (ATARAX/VISTARIL) 10 MG tablet Take 1-2 tablets (10-20 mg total) by mouth at bedtime as needed. FOR SLEEP   . [DISCONTINUED] insulin glargine, 2 Unit Dial, (TOUJEO MAX SOLOSTAR) 300 UNIT/ML Solostar Pen Inject 70 Units into the skin daily. Patient to increase dose to 70 units at bedtime   . [DISCONTINUED] mometasone-formoterol  (DULERA) 100-5 MCG/ACT AERO Inhale 2 puffs into the lungs 2 (two) times daily.   . DULoxetine (CYMBALTA) 60 MG capsule Take 1 capsule (60 mg total) by mouth daily.   . hydrOXYzine (ATARAX/VISTARIL) 10 MG tablet Take 1-2 tablets (10-20 mg total) by mouth at bedtime as needed. FOR SLEEP   . insulin glargine, 2 Unit Dial, (TOUJEO MAX SOLOSTAR) 300 UNIT/ML Solostar Pen Inject 50 Units into the skin daily. May decrease to 40 units at bedtime if having episodes of hypoglycemia.   . [DISCONTINUED] atorvastatin (LIPITOR) 10 MG tablet Take 1 tablet (10 mg total) by mouth daily.   . [DISCONTINUED] Na Sulfate-K Sulfate-Mg Sulf 17.5-3.13-1.6 GM/177ML SOLN At 5 PM the day before procedure take 1 bottle and 5 hours before procedure take 1 bottle. (Patient not taking: Reported on 09/21/2020)   . [DISCONTINUED] nicotine polacrilex (NICORETTE) 4 MG gum Take 1 each (4 mg total) by mouth as needed for smoking cessation. (Patient not taking: Reported on 09/21/2020) 08/09/2020: Not taking due to cost of rx.     No facility-administered encounter medications on file as of 09/21/2020.    Surgical History: Past Surgical History:  Procedure Laterality Date  .  ingrown toenail removal    . CHOLECYSTECTOMY N/A 01/23/2019   Procedure: LAPAROSCOPIC CHOLECYSTECTOMY;  Surgeon: Olean Ree, MD;  Location: [facility] ORS;  Service: General;  Laterality: N/A;  . CLAVICLE SURGERY Right   . COLONOSCOPY  2017  . COLONOSCOPY WITH PROPOFOL N/A 08/25/2020   Procedure: COLONOSCOPY WITH PROPOFOL;  Surgeon: Virgel Manifold, MD;  Location: [facility] ENDOSCOPY;  Service: Endoscopy;  Laterality: N/A;  . ESOPHAGOGASTRODUODENOSCOPY (EGD) WITH PROPOFOL N/A 08/25/2020   Procedure: ESOPHAGOGASTRODUODENOSCOPY (EGD) WITH PROPOFOL;  Surgeon: Virgel Manifold, MD;  Location: [facility] ENDOSCOPY;  Service: Endoscopy;  Laterality: N/A;  . MOUTH SURGERY     REMOVED ALL TEETH  . SHOULDER SURGERY Right   . UPPER GI ENDOSCOPY  2017    Medical History: Past  Medical History:  Diagnosis Date  . Anxiety   . Arthritis    NECK  . Bronchitis   . Coronary artery disease   . Depression   . Diabetes mellitus without complication ([place])   . 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 Dyspnea    DUE TO GALLBLADDER PER PT  . Emphysema lung ([place])   . Family history of adverse reaction to anesthesia    N/V, TROUBLE BREATHING COMING OUT OF ANESTHESIA  . Fatty liver   . GERD (gastroesophageal reflux disease)   . Headache    MIGRAINES  . Hyperlipidemia   . Hypertension   . Irregular heart beat unk  . Myocardial infarction ([place]) 2010   MILD   . PTSD (  post-traumatic stress disorder)   . Sleep apnea    USES CPAP  . Tachycardia     Family History: Family History  Problem Relation Age of Onset  . Hypertension Mother   . Diabetes type II Mother   . Diabetes Mother   . COPD Father   . Cancer Father   . Heart disease Father   . Diabetes Sister   . Anxiety disorder Sister   . Depression Sister   . Diabetes Brother   . Anxiety disorder Brother   . Depression Brother   . Diabetes Maternal Aunt   . Diabetes Sister   . Anxiety disorder Sister   . Depression Sister   . Diabetes Sister   . Anxiety disorder Sister   . Depression Sister   . Diabetes Sister   . Anxiety disorder Sister   . Depression Sister   . Colon cancer Maternal Uncle     Social History   Socioeconomic History  . Marital status: Divorced    Spouse name: Not on file  . Number of children: 4  . Years of education: Not on file  . Highest education level: 8th grade  Occupational History  . Not on file  Tobacco Use  . Smoking status: Current Every Day Smoker    Packs/day: 1.00    Years: 40.00    Pack years: 40.00    Types: Cigarettes  . Smokeless tobacco: Former Systems developer    Types: Chew  . Tobacco comment: 1 pack per day reported 04/07/2020  Vaping Use  . Vaping Use: Former  Substance and Sexual Activity  . Alcohol use: No    Alcohol/week: 0.0 standard drinks  .  Drug use: No  . Sexual activity: Not Currently  Other Topics Concern  . Not on file  Social History Narrative  . Not on file   Social Determinants of Health   Financial Resource Strain: Low Risk   . Difficulty of Paying Living Expenses: Not very hard  Food Insecurity: Not on file  Transportation Needs: Not on file  Physical Activity: Not on file  Stress: Not on file  Social Connections: Not on file  Intimate Partner Violence: Not on file      Review of Systems  Constitutional: Negative for chills, fatigue and unexpected weight change.  HENT: Negative for congestion, rhinorrhea, sneezing and sore throat.   Eyes: Negative for redness.  Respiratory: Positive for cough and shortness of breath. Negative for chest tightness and wheezing.   Cardiovascular: Negative for chest pain and palpitations.  Gastrointestinal: Negative for abdominal pain, constipation, diarrhea, nausea and vomiting.  Genitourinary: Negative for dysuria and frequency.  Musculoskeletal: Negative for arthralgias, back pain, joint swelling and neck pain.  Skin: Negative for rash.  Neurological: Negative.  Negative for tremors and numbness.  Hematological: Negative for adenopathy. Does not bruise/bleed easily.  Psychiatric/Behavioral: Negative for behavioral problems (Depression), sleep disturbance and suicidal ideas. The patient is not nervous/anxious.     Vital Signs: BP 118/82   Pulse 80   Temp 98.5 F (36.9 C)   Resp 16   Ht 5\' 9"  (1.753 m)   Wt 229 lb 11.2 oz (104.2 kg)   SpO2 92%   BMI 33.92 kg/m    Physical Exam Constitutional:      General: He is not in acute distress.    Appearance: Normal appearance. He is obese. He is not ill-appearing.  HENT:     Head: Normocephalic and atraumatic.  Cardiovascular:     Rate and  Rhythm: Normal rate and regular rhythm.     Pulses: Normal pulses.     Heart sounds: Normal heart sounds.  Pulmonary:     Effort: Pulmonary effort is normal. No respiratory  distress.     Breath sounds: Normal breath sounds.  Skin:    General: Skin is warm and dry.     Capillary Refill: Capillary refill takes less than 2 seconds.  Neurological:     Mental Status: He is alert and oriented to person, place, and time.  Psychiatric:        Mood and Affect: Mood normal.        Behavior: Behavior normal.    Assessment/Plan: 1. Uncontrolled type 2 diabetes mellitus with hyperglycemia [facility]) Patient has made progress with his glucose levels since his last office visit. Wilder Glade and trulicity have been working well for him. He already decreased his Toujeo to 60 units. Trulicity dose increased to 1.5 mg weekly. Decrease Toujeo dose to 50 units but may decrease to 40 units if he starts having hypoglycemic episodes. Patient is using the freestyle [place] and will check his glucose level several times a day. He was able to provide his prior glucose readins for today's visit. Will reevaluate control of glucose levels and recheck A1C in 4 weeks.  - insulin glargine, 2 Unit Dial, (TOUJEO MAX SOLOSTAR) 300 UNIT/ML Solostar Pen; Inject 50 Units into the skin daily. May decrease to 40 units at bedtime if having episodes of hypoglycemia.  Dispense: 10 mL; Refill: 3 - dapagliflozin propanediol (FARXIGA) 10 MG TABS tablet; Take 1 tablet (10 mg total) by mouth daily before breakfast.  Dispense: 30 tablet; Refill: 1 - Dulaglutide 1.5 MG/0.5ML SOPN; Inject 1.5 mg into the skin once a week.  Dispense: 4 mL; Refill: 1  2. Pulmonary emphysema, unspecified emphysema type ([place]) Current medications are not providing optimal support. Stop Ruthe Mannan, start Breztri, samples provided during his office visit today, prescription sent to pharmacy.  - Budeson-Glycopyrrol-Formoterol (BREZTRI AEROSPHERE) 160-9-4.8 MCG/ACT AERO; Inhale 2 puffs into the lungs 2 (two) times daily.  Dispense: 10.7 g; Refill: 11  3. Mixed hyperlipidemia Lipid panel from August 17, 2020, all levels were severely abnormal. Estimated  10-year ASCVD risk is 39.7%. Patient needs high intensity statin therapy and the addition of ezetimibe. Medications were changed accordingly.  - atorvastatin (LIPITOR) 40 MG tablet; Take 1 tablet (40 mg total) by mouth daily.  Dispense: 90 tablet; Refill: 3 - ezetimibe (ZETIA) 10 MG tablet; Take 1 tablet (10 mg total) by mouth daily.  Dispense: 30 tablet; Refill: 2  4. Insomnia due to mental disorder History of insomnia due to PTSD and major depression, takes hydroxyzine for sleep, refill ordered. - hydrOXYzine (ATARAX/VISTARIL) 10 MG tablet; Take 1-2 tablets (10-20 mg total) by mouth at bedtime as needed. FOR SLEEP  Dispense: 30 tablet; Refill: 2  5. MDD (major depressive disorder), recurrent, in full remission ([place]) Duloxetine effective in controlling depressive symptoms, stable at this time.  - DULoxetine (CYMBALTA) 60 MG capsule; Take 1 capsule (60 mg total) by mouth daily.  Dispense: 30 capsule; Refill: 2  6. PTSD (post-traumatic stress disorder) Stable at this time.  - DULoxetine (CYMBALTA) 60 MG capsule; Take 1 capsule (60 mg total) by mouth daily.  Dispense: 30 capsule; Refill: 2   General Counseling: Leno verbalizes understanding of the findings of todays visit and agrees with plan of treatment. I have discussed any further diagnostic evaluation that may be needed or ordered today. We also reviewed his medications today.  he has been encouraged to call the office with any questions or concerns that should arise related to todays visit.    No orders of the defined types were placed in this encounter.   Meds ordered this encounter  Medications  . insulin glargine, 2 Unit Dial, (TOUJEO MAX SOLOSTAR) 300 UNIT/ML Solostar Pen    Sig: Inject 50 Units into the skin daily. May decrease to 40 units at bedtime if having episodes of hypoglycemia.    Dispense:  10 mL    Refill:  3  . hydrOXYzine (ATARAX/VISTARIL) 10 MG tablet    Sig: Take 1-2 tablets (10-20 mg total) by mouth at bedtime  as needed. FOR SLEEP    Dispense:  30 tablet    Refill:  2  . DULoxetine (CYMBALTA) 60 MG capsule    Sig: Take 1 capsule (60 mg total) by mouth daily.    Dispense:  30 capsule    Refill:  2  . dapagliflozin propanediol (FARXIGA) 10 MG TABS tablet    Sig: Take 1 tablet (10 mg total) by mouth daily before breakfast.    Dispense:  30 tablet    Refill:  1  . atorvastatin (LIPITOR) 40 MG tablet    Sig: Take 1 tablet (40 mg total) by mouth daily.    Dispense:  90 tablet    Refill:  3  . ezetimibe (ZETIA) 10 MG tablet    Sig: Take 1 tablet (10 mg total) by mouth daily.    Dispense:  30 tablet    Refill:  2  . Dulaglutide 1.5 MG/0.5ML SOPN    Sig: Inject 1.5 mg into the skin once a week.    Dispense:  4 mL    Refill:  1  . Budeson-Glycopyrrol-Formoterol (BREZTRI AEROSPHERE) 160-9-4.8 MCG/ACT AERO    Sig: Inhale 2 puffs into the lungs 2 (two) times daily.    Dispense:  10.7 g    Refill:  11    Return in about 4 weeks (around 10/19/2020) for F/U, Recheck A1C, diabetes, med refill, Rashel Okeefe PCP.   Total time spent:30 Minutes Time spent includes review of chart, medications, test results, and follow up plan with the patient.   [place] Controlled Substance Database was reviewed by me.  This patient was seen by Jonetta Osgood, FNP-C in collaboration with Dr. Clayborn Bigness as a part of collaborative care agreement.   Lashona Schaaf R. Valetta Fuller, MSN, FNP-C Internal medicine

## 2020-09-25 DIAGNOSIS — E119 Type 2 diabetes mellitus without complications: Secondary | ICD-10-CM | POA: Diagnosis not present

## 2020-09-25 DIAGNOSIS — Z794 Long term (current) use of insulin: Secondary | ICD-10-CM | POA: Diagnosis not present

## 2020-09-26 DIAGNOSIS — E782 Mixed hyperlipidemia: Secondary | ICD-10-CM | POA: Insufficient documentation

## 2020-09-26 DIAGNOSIS — J439 Emphysema, unspecified: Secondary | ICD-10-CM | POA: Insufficient documentation

## 2020-09-29 ENCOUNTER — Other Ambulatory Visit: Payer: Medicare Other

## 2020-10-11 NOTE — Progress Notes (Addendum)
Chronic Care Management Pharmacy Note  10/19/2020 Name:  Raymond [name] MRN:  [id number] DOB:  August 01, 1965  Subjective: Raymond [name] is an 55 y.o. year old male who is a primary patient of Jonetta Osgood, NP.  The CCM team was consulted for assistance with disease management and care coordination needs.    Engaged with patient by telephone for follow up visit in response to provider referral for pharmacy case management and/or care coordination services.   Consent to Services:  The patient was given the following information about Chronic Care Management services today, agreed to services, and gave verbal consent: 1. CCM service includes personalized support from designated clinical staff supervised by the primary care provider, including individualized plan of care and coordination with other care providers 2. 24/7 contact phone numbers for assistance for urgent and routine care needs. 3. Service will only be billed when office clinical staff spend 20 minutes or more in a month to coordinate care. 4. Only one practitioner may furnish and bill the service in a calendar month. 5.The patient may stop CCM services at any time (effective at the end of the month) by phone call to the office staff. 6. The patient will be responsible for cost sharing (co-pay) of up to 20% of the service fee (after annual deductible is met). Patient agreed to services and consent obtained.  Patient Care Team: Jonetta Osgood, NP as PCP - General (Nurse Practitioner) Edythe Clarity, [facility] as Pharmacist (Pharmacist) Lavera Guise, MD (Internal Medicine)  Recent office visits: 09/21/20 Valetta Fuller) - started on Zetia and increased dose of Lipitor to 69m due to elevated lipid panel.  [facility]  New DM medication regimen of Farxiga, Trulicity, and Toujeo.  Recent consult visits: None since last CCM call  Hospital visits: None in previous 6 months   Objective:  Lab Results   Component Value Date   CREATININE 1.06 08/17/2020   BUN 14 08/17/2020   GFRNONAA >60 03/09/2020   GFRAA >60 01/25/2019   NA 133 (L) 08/17/2020   K 5.1 08/17/2020   CALCIUM 10.2 08/17/2020   CO2 22 08/17/2020   GLUCOSE 400 (H) 08/17/2020    Lab Results  Component Value Date/Time   HGBA1C 8.7 (A) 10/18/2020 01:48 PM   HGBA1C 10.7 (A) 07/12/2020 09:03 AM   HGBA1C 11.9 (H) 05/31/2017 04:49 AM   HGBA1C 8.5 (H) 07/15/2015 06:32 PM   HGBA1C 6.2 12/09/2012 05:30 AM    Last diabetic Eye exam: No results found for: HMDIABEYEEXA  Last diabetic Foot exam: No results found for: HMDIABFOOTEX   Lab Results  Component Value Date   CHOL 280 (H) 08/17/2020   HDL 31 (L) 08/17/2020   LDLCALC 153 (H) 08/17/2020   TRIG 501 (H) 08/17/2020   CHOLHDL 4.8 07/15/2015    Hepatic Function Latest Ref Rng & Units 08/17/2020 03/09/2020 01/25/2019  Total Protein 6.0 - 8.5 g/dL 7.6 8.5(H) 8.1  Albumin 3.8 - 4.9 g/dL 3.9 4.1 3.8  AST 0 - 40 IU/L 33 28 52(H)  ALT 0 - 44 IU/L 52(H) 26 73(H)  Alk Phosphatase 44 - 121 IU/L 143(H) 109 104  Total Bilirubin 0.0 - 1.2 mg/dL 0.2 0.6 1.1  Bilirubin, Direct 0.0 - 0.2 mg/dL - <0.1 -    Lab Results  Component Value Date/Time   TSH 1.890 08/17/2020 10:03 AM   TSH 4.120 06/10/2018 11:36 AM   FREET4 1.08 08/17/2020 10:03 AM    CBC Latest Ref Rng & Units  08/17/2020 01/25/2019 01/20/2019  WBC 3.4 - 10.8 x10E3/uL 8.5 11.9(H) 11.4(H)  Hemoglobin 13.0 - 17.7 g/dL 15.6 14.7 15.1  Hematocrit 37.5 - 51.0 % 45.9 43.1 43.8  Platelets 150 - 450 x10E3/uL 213 189 214    No results found for: VD25OH  Clinical ASCVD: Yes  The 10-year ASCVD risk score Mikey Bussing DC Jr., et al., 2013) is: 39.2%   Values used to calculate the score:     Age: 2 years     Sex: Male     Is Non-Hispanic African American: No     Diabetic: Yes     Tobacco smoker: Yes     Systolic Blood Pressure: 154 mmHg     Is BP treated: Yes     HDL Cholesterol: 31 mg/dL     Total Cholesterol: 280 mg/dL     Depression screen [facility] 2/9 10/18/2020 09/21/2020 08/31/2020  Decreased Interest 0 0 0  Down, Depressed, Hopeless 0 0 0  PHQ - 2 Score 0 0 0  Altered sleeping - - -  Tired, decreased energy - - -  Change in appetite - - -  Feeling bad or failure about yourself  - - -  Trouble concentrating - - -  Moving slowly or fidgety/restless - - -  Suicidal thoughts - - -  PHQ-9 Score - - -  Difficult doing work/chores - - -      Social History   Tobacco Use  Smoking Status Every Day   Packs/day: 1.00   Years: 40.00   Pack years: 40.00   Types: Cigarettes  Smokeless Tobacco Former   Types: Chew  Tobacco Comments   1 pack per day reported 04/07/2020   BP Readings from Last 3 Encounters:  10/18/20 117/82  09/21/20 118/82  09/06/20 130/84   Pulse Readings from Last 3 Encounters:  10/18/20 98  09/21/20 80  09/06/20 85   Wt Readings from Last 3 Encounters:  10/18/20 229 lb 3.2 oz (104 kg)  09/21/20 229 lb 11.2 oz (104.2 kg)  09/06/20 231 lb (104.8 kg)   BMI Readings from Last 3 Encounters:  10/18/20 33.85 kg/m  09/21/20 33.92 kg/m  09/06/20 34.11 kg/m    Assessment/Interventions: Review of patient past medical history, allergies, medications, health status, including review of consultants reports, laboratory and other test data, was performed as part of comprehensive evaluation and provision of chronic care management services.   SDOH:  (Social Determinants of Health) assessments and interventions performed: Yes   Financial Resource Strain: Low Risk    Difficulty of Paying Living Expenses: Not very hard    SDOH Screenings   Alcohol Screen: Low Risk    Last Alcohol Screening Score (AUDIT): 0  Depression (PHQ2-9): Low Risk    PHQ-2 Score: 0  Financial Resource Strain: Low Risk    Difficulty of Paying Living Expenses: Not very hard  Food Insecurity: Not on file  Housing: Not on file  Physical Activity: Not on file  Social Connections: Not on file  Stress: Not on  file  Tobacco Use: High Risk   Smoking Tobacco Use: Every Day   Smokeless Tobacco Use: Former  Transport planner Needs: Not on file    [place]  Allergies  Allergen Reactions   Onion Anaphylaxis   Hydrocodone    Norco [Hydrocodone-Acetaminophen] Itching   Hydrocodone-Acetaminophen Itching   Nickel Rash    Medications Reviewed Today     Reviewed by Tora Perches, CMA (Certified Medical Assistant) on 10/18/20 at 1325  Med List  Status: <None>   Medication Order Taking? Sig Documenting Provider Last Dose Status Informant  ACCU-CHEK FASTCLIX LANCETS MISC [id number] Yes yse as directed twice a day Ronnell Freshwater, NP Taking Active Self  albuterol (PROAIR HFA) 108 (90 Base) MCG/ACT inhaler [id number] Yes Inhale 2-4 puffs by mouth Every 4-6 hours as needed for wheezing, cough, and/or shortness of breath Ronnell Freshwater, NP Taking Active Self  aspirin EC 81 MG tablet [id number] Yes Take 81 mg by mouth daily at 3 pm.  [provider] Taking Active Self  atorvastatin (LIPITOR) 40 MG tablet [id number] Yes Take 1 tablet (40 mg total) by mouth daily. Jonetta Osgood, NP Taking Active   Budeson-Glycopyrrol-Formoterol (BREZTRI AEROSPHERE) 160-9-4.8 MCG/ACT AERO [id number] Yes Inhale 2 puffs into the lungs 2 (two) times daily. Jonetta Osgood, NP Taking Active   buPROPion (WELLBUTRIN SR) 150 MG 12 hr tablet [id number] Yes Take 1 tablet (150 mg total) by mouth 2 (two) times daily. Ursula Alert, MD Taking Active   colestipol (COLESTID) 1 g tablet [id number]  Take 2 tablets (2 g total) by mouth daily for 30 doses. Virgel Manifold, MD  Expired 09/08/20 2359   Continuous Blood Gluc Receiver (FREESTYLE LIBRE 2 READER) DEVI [id number] Yes Use as directed to read blood sugar 4 times a day  Dx E11.65 Lavera Guise, MD Taking Active Self  Continuous Blood Gluc Sensor (FREESTYLE LIBRE [place]) [place] [id number] Yes 1 each by Does not apply route every 14 (fourteen) days. Apply to skin  every 14 days E11.65 Lavera Guise, MD Taking Active Self  dapagliflozin propanediol (FARXIGA) 10 MG TABS tablet [id number] Yes Take 1 tablet (10 mg total) by mouth daily before breakfast. Jonetta Osgood, NP Taking Active   Dulaglutide 1.5 MG/0.5ML SOPN [id number] Yes Inject 1.5 mg into the skin once a week. Jonetta Osgood, NP Taking Active   DULoxetine (CYMBALTA) 60 MG capsule [id number] Yes Take 1 capsule (60 mg total) by mouth daily. Jonetta Osgood, NP Taking Active   ezetimibe (ZETIA) 10 MG tablet [id number] Yes Take 1 tablet (10 mg total) by mouth daily. Jonetta Osgood, NP Taking Active   famotidine (PEPCID) 20 MG tablet [id number] Yes Take 1 tablet (20 mg total) by mouth 2 (two) times daily. Ronnell Freshwater, NP Taking Active Self  glucose blood (ACCU-CHEK AVIVA PLUS) test strip [id number] Yes Blood sugar testing TID and as needed. E11.65 Ronnell Freshwater, NP Taking Active Self  hydrOXYzine (ATARAX/VISTARIL) 10 MG tablet [id number] Yes Take 1-2 tablets (10-20 mg total) by mouth at bedtime as needed. FOR SLEEP Abernathy, Alyssa, NP Taking Active   insulin glargine, 2 Unit Dial, (TOUJEO MAX SOLOSTAR) 300 UNIT/ML Solostar Pen [id number] Yes Inject 50 Units into the skin daily. May decrease to 40 units at bedtime if having episodes of hypoglycemia. Jonetta Osgood, NP Taking Active   Insulin Pen Needle 32G X 4 MM MISC [id number] Yes Use with daily dose insulin and with sliding scale insulin as needed. Ronnell Freshwater, NP Taking Active Self  MELATONIN PO [id number] Yes Take 10 mg by mouth daily. [provider] Taking Active   metoprolol tartrate (LOPRESSOR) 100 MG tablet [id number] Yes Take 1 tablet (100 mg total) by mouth 2 (two) times daily. Ronnell Freshwater, NP Taking Active Self  nitroGLYCERIN (NITROSTAT) 0.4 MG SL tablet [id number] Yes Place 1 tablet (0.4 mg total) under the tongue every 5 (five) minutes x 3 doses as needed for chest pain. Ronnell Freshwater, NP Taking Active Self  pantoprazole (PROTONIX) 40 MG tablet [id number] Yes Take 1 tablet (40 mg total) by mouth daily. Lavera Guise, MD Taking Active   temazepam (RESTORIL) 30 MG capsule [id number] Yes Take 1 capsule (30 mg total) by mouth at bedtime. Ursula Alert, MD Taking Active             Patient Active Problem List   Diagnosis Date Noted   Mixed hyperlipidemia 09/26/2020   Pulmonary emphysema ([place]) 09/26/2020   Barrett's esophagus without dysplasia    Gastric erythema    Special screening for malignant neoplasms, colon    Polyp of colon    MDD (major depressive disorder), recurrent episode, mild ([place]) 06/23/2020   Noncompliance with diabetes treatment 04/12/2020   Angina pectoris ([place]) 04/12/2020   MDD (major depressive disorder), recurrent, in full remission ([place]) 01/05/2020   MDD (major depressive disorder), recurrent, in partial remission ([place]) 10/30/2019   Tobacco use disorder 07/02/2019   Right upper quadrant pain    Encounter for other preprocedural examination 01/20/2019   PTSD (post-traumatic stress disorder) 01/20/2019   MDD (major depressive disorder), recurrent episode, moderate ([place]) 01/20/2019   Insomnia due to mental disorder 01/20/2019   Calculus of gallbladder with acute on chronic cholecystitis without obstruction 11/15/2018   Generalized abdominal pain 10/08/2018   Local superficial swelling, mass or lump 10/08/2018   Enterocolitis 08/16/2018   Left upper quadrant abdominal pain of unknown etiology 08/16/2018   Diastasis recti 06/19/2018   Generalized anxiety disorder 04/01/2018   Clotted blood in stool 12/31/2017   Diarrhea 12/31/2017   Encounter for general adult medical examination with abnormal findings 10/27/2017   Uncontrolled type 2 diabetes mellitus with hyperglycemia ([place]) 10/27/2017   Onychomycosis with ingrown toenail 10/27/2017   Dysuria 10/27/2017   Uncontrolled type 2 diabetes mellitus with hypoglycemia without coma ([place]) 07/08/2017   Shortness of breath  07/08/2017   Cigarette nicotine dependence without complication 54/62/7035   Influenza A 05/30/2017   OSA on CPAP 08/12/2015   Cervical spondylosis 06/14/2015   Essential hypertension 04/08/2015   Depression 04/08/2015   Gastroesophageal reflux disease without esophagitis 01/05/2015   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  Conditions to be addressed/monitored:  HTN, GERD, Type II DM w/neuropathy, Depression, Tobacco use disorder  Care Plan : General Pharmacy (Adult)  Updates made by Edythe Clarity, RPH since 10/19/2020 12:00 AM     Problem: HTN, HLD, DM, GERD. Tobacco use, SOB   Priority: High  Onset Date: 08/17/2020     Long-Range Goal: Patient-Specific Goal   Start Date: 08/17/2020  Expected End Date: 02/16/2021  Recent Progress: On track  Priority: High  Note:   Current Barriers:  Unable to achieve control of glucose.  Does not adhere to prescribed medication regimen  Pharmacist Clinical Goal(s):  Patient will achieve control of glucose as evidenced by A1c testing adhere to plan to optimize therapeutic regimen for diabetes as evidenced by report of adherence to recommended medication management changes adhere to prescribed medication regimen as evidenced by fill dates. through collaboration with PharmD and provider.   Interventions: 1:1 collaboration with Lavera Guise, MD regarding development and update of comprehensive plan of care as evidenced by provider attestation and co-signature Inter-disciplinary care team collaboration (see longitudinal plan of care) Comprehensive medication review performed; medication list updated in electronic medical record  Hypertension (BP goal <130/80) -Controlled -Current treatment: Metoprolol tartrate 136m BID -Medications previously tried: none  noted -Current home readings: patient reports most are around 130/80.  Did not have specific logs  with him today -Current dietary habits: now using his air fryer a lot more.  He does not fry in butter or oil as much as he used to.  Trying to avoid carbohydrates as much as possible.  He does report sodas are his weakness.  Usually drinks about 5 to 6 sodas per day. -Current exercise habits: will go outside and walk around with his dogs 4 to 5 times per day.  Used to have a membership to [employer] but has not been back since [place]. -Denies hypotensive/hypertensive symptoms -Educated on BP goals and benefits of medications for prevention of heart attack, stroke and kidney damage; Daily salt intake goal < 2300 mg; Exercise goal of 150 minutes per week; Importance of home blood pressure monitoring; -Counseled to monitor BP at home daily, document, and provide log at future appointments -Recommended to continue current medication Recommended walking plan for 15-20 minutes per day, either outside or back in the gym.  Diabetes (A1c goal <8%) - initial goal.  Would eventually like to see him < 7. -Uncontrolled -Current medications: Farxiga 48m daily Toujeo 50 units into the skin hs Trulicity 16.3JSonce weekly -Medications previously tried: FIran -Current home glucose readings fasting glucose: did not bring logs but reports all his readings have been "high" lately post prandial glucose:  -Reports hypoglycemic/hyperglycemic symptoms - numbess in feet, he sometimes cannot feel his dogs nibbling at his toes -Current meal patterns: SODAS 5 to 6 per day, trying to limit breads with his meats, using air fryer vs eating regular fried foods -Current exercise: walking dogs -Educated on A1c and blood sugar goals; Exercise goal of 150 minutes per week; Benefits of weight loss; Benefits of routine self-monitoring of blood sugar; Continuous glucose monitoring;  -He reports that his blood sugar was really well controlled when he was using the trial of Freestyle libre.  Has been having issues getting  this approved and has seen his blood sugar control decline as he often does not prick his finger while he is out and this means he does not take his sliding scale injection. -Also unsure if he should still be taking FIran he reports someone took him off of it but I cannot find documentation of that. -Counseled to check feet daily and get yearly eye exams -Recommended to continue current medication Collaborate with PCP to determine if patient should still be taking FIran  Will work with DME suppliers to get patient FColgate-Palmoliveas he qualifies based on number of injections.  Really encouraged patient to find replacement for sodas or at least limit use.  Monitoring plan initiated moving forward.  Update 10/19/20 Fasting sugars have been around 110.  With almost all < 130. Highest sugar he has seen was 210 after eating. He is liking this new combination of medications a lot better.  He did have to stop using Freestyle Libre temporarily due to low alerts that were very different from his manual meter and has not started back using it yet.  All medications currently affordable.  His A1c improved to 8.7!!   He cut out sodas completely and is watching what he eats better.  Recommend continue current meds RTC in 3 months for A1c check. Encouraged physical activity regimen.  Hyperlipidemia: (LDL goal < 70) -Uncontrolled -Current treatment: Atorvastatin 424mdaily Zetia 1066mMedications previously tried: simvastatin  -Current dietary patterns: see DM -Current exercise habits: minimal -  Educated on Cholesterol goals;  Benefits of statin for ASCVD risk reduction; Importance of limiting foods high in cholesterol; Exercise goal of 150 minutes per week; -He is tolerating new medications fine and reports 100% adherence with these -Counseled on diet and exercise extensively Recommended to continue current medication Recommended lipid panel at next OV.   SOB (Goal: Minimize  symptoms) -Controlled -Current treatment  Breztri 160-9-4.8 mcg/act 2 puffs twice daily Albuterol HFA 90 mcg prn -Medications previously tried: [employer] -Breathing is much better since starting Breztri, has not had to use his rescue inhaler hardly at all.  Denies any recent SOB.  -Recommended to continue current medication Counseled on use of maintenance inhaler and to rinse mouth after use.  Tobacco use (Goal Smoking cessation) -Uncontrolled -Previous quit attempts: none -Current treatment  Bupropoion SR 1108m BID -Patient triggers include: boredom -Patient smoking 1.5 ppd currently.  He wants to quit but has not had luck with patch or gum.  Is allergic to adhesive on patch. -Recommended hard candies, straws, etc to use as a replacelement.  GERD (Goal: Minimize symptoms) -Controlled -Current treatment  Pantoprazole 492mdaily Famotidine 206mID -Medications previously tried: none noted -Recently discovered he has hiatal hernia. -Denies any symptoms except for when he lays down at night.   -Recommended to continue current medication Recommended elevat head of the bed when he lays down.   Patient Goals/Self-Care Activities Patient will:  - take medications as prescribed focus on medication adherence by pill count check glucose daily, document, and provide at future appointments check blood pressure daily, document, and provide at future appointments engage in dietary modifications by limiting soda intake  Follow Up Plan: The care management team will reach out to the patient again over the next 90 days.              Medication Assistance: None required.  Patient affirms current coverage meets needs.  [facility] [id number]: [phone]: [phone] pill box? Yes Pt endorses 90% compliance  We discussed: Benefits of medication  synchronization, packaging and delivery as well as enhanced pharmacist oversight with [employer]. Patient decided to: Continue current medication management strategy  Care Plan and Follow Up Patient Decision:  Patient agrees to Care Plan and Follow-up.  Plan: The care management team will reach out to the patient again over the next 90 days.  ChrBeverly MilchharmD Clinical Pharmacist [phone]

## 2020-10-12 ENCOUNTER — Ambulatory Visit: Payer: Medicare Other | Admitting: Nurse Practitioner

## 2020-10-18 ENCOUNTER — Other Ambulatory Visit: Payer: Self-pay

## 2020-10-18 ENCOUNTER — Ambulatory Visit (INDEPENDENT_AMBULATORY_CARE_PROVIDER_SITE_OTHER): Payer: Medicare Other | Admitting: Nurse Practitioner

## 2020-10-18 ENCOUNTER — Encounter: Payer: Self-pay | Admitting: Nurse Practitioner

## 2020-10-18 VITALS — BP 117/82 | HR 98 | Temp 98.3°F | Resp 16 | Ht 69.0 in | Wt 229.2 lb

## 2020-10-18 DIAGNOSIS — Z0001 Encounter for general adult medical examination with abnormal findings: Secondary | ICD-10-CM | POA: Diagnosis not present

## 2020-10-18 DIAGNOSIS — K219 Gastro-esophageal reflux disease without esophagitis: Secondary | ICD-10-CM

## 2020-10-18 DIAGNOSIS — I700 Atherosclerosis of aorta: Secondary | ICD-10-CM | POA: Diagnosis not present

## 2020-10-18 DIAGNOSIS — F17200 Nicotine dependence, unspecified, uncomplicated: Secondary | ICD-10-CM

## 2020-10-18 DIAGNOSIS — F411 Generalized anxiety disorder: Secondary | ICD-10-CM

## 2020-10-18 DIAGNOSIS — K5903 Drug induced constipation: Secondary | ICD-10-CM | POA: Diagnosis not present

## 2020-10-18 DIAGNOSIS — I10 Essential (primary) hypertension: Secondary | ICD-10-CM

## 2020-10-18 DIAGNOSIS — E782 Mixed hyperlipidemia: Secondary | ICD-10-CM | POA: Diagnosis not present

## 2020-10-18 DIAGNOSIS — S60521A Blister (nonthermal) of right hand, initial encounter: Secondary | ICD-10-CM

## 2020-10-18 DIAGNOSIS — Z9989 Dependence on other enabling machines and devices: Secondary | ICD-10-CM

## 2020-10-18 DIAGNOSIS — J439 Emphysema, unspecified: Secondary | ICD-10-CM | POA: Diagnosis not present

## 2020-10-18 DIAGNOSIS — Z6833 Body mass index (BMI) 33.0-33.9, adult: Secondary | ICD-10-CM

## 2020-10-18 DIAGNOSIS — G4733 Obstructive sleep apnea (adult) (pediatric): Secondary | ICD-10-CM

## 2020-10-18 DIAGNOSIS — E1165 Type 2 diabetes mellitus with hyperglycemia: Secondary | ICD-10-CM

## 2020-10-18 DIAGNOSIS — E6609 Other obesity due to excess calories: Secondary | ICD-10-CM

## 2020-10-18 DIAGNOSIS — R300 Dysuria: Secondary | ICD-10-CM | POA: Diagnosis not present

## 2020-10-18 LAB — POCT GLYCOSYLATED HEMOGLOBIN (HGB A1C): Hemoglobin A1C: 8.7 % — AB (ref 4.0–5.6)

## 2020-10-18 MED ORDER — MUPIROCIN 2 % EX OINT: 1.0000 "application " | TOPICAL_OINTMENT | Freq: Two times a day (BID) | CUTANEOUS | 0 refills | Status: DC

## 2020-10-18 MED ORDER — POLYETHYLENE GLYCOL 3350 17 G PO PACK: 17.0000 g | PACK | Freq: Every day | ORAL | 0 refills | Status: DC

## 2020-10-18 MED ORDER — FAMOTIDINE 20 MG PO TABS: 20.0000 mg | ORAL_TABLET | Freq: Two times a day (BID) | ORAL | 1 refills | Status: DC

## 2020-10-18 NOTE — Progress Notes (Signed)
[facility] [address] [place], [place] [address]  Internal MEDICINE  Office Visit Note  Patient Name: Raymond [name]  [id number]  [id number]  Date of Service: 10/18/2020  Chief Complaint  Patient presents with   Diabetes    A1C   Medicare Wellness    Stomach pain and constipation, spots on right hand on the index and middle finger    Anxiety   Hyperlipidemia   Hypertension   COPD   Constipation    HPI Raymond [name] presents for an annual well visit and physical exam. he has a history of Anxiety, depression, arthritis, COPD, hyperlipidemia, hypertension, GERD, diabetes, depression, MI, OSA, CAD, and emphysema. He has a surgical history of cholecystectomy, and right shoulder and clavicle surgery. He is a current everyday smoker approximately 1 ppd for 40 years.  He recently had a colonoscopy and upper endoscopy on may 4th 2022. A few biopsies were obtained and they were negative for malignancy. He has an echocardiogram scheduled for 11/03/20.  A low dose CT chest for lung cancer screening was done in April 2022 with the result of Lung-RADS 2, benign in appearance, scattered small solid pulmonary nodules with the largest being 4.5 mm in diameter in the left lower lobe.  Labs were recently obtained in April 2022 and discussed at a previous visit.   -A1C is 8.7 today with was a significant improvement. His previous A1C was 10.7 in March 2022. He has been using the [employer] 2 sensor. This has been working really well although he has recently had a problem with the sensor constantly reading low in the 50s or 40s. He tried changing the sensor and he has checked his glucose with a traditional glucometer and the glucose level was higher and wnl. Troubleshooting with the sensor: discussed drinking enough water and that if he is not adequately hydrated that the sensor may not read accurately. Also discussed replacing the sensor and putting it in an area where there is no tattoo.   -He reports constipation since stopping the janumet. He is not currently on any stool softener.  -started using breztri inhaler at last office visit. He reports that it is working well. He has not needed to use his rescue inhaler at all since he started using breztri.  -he has blisters on his right hand that have popped.    Current Medication: Outpatient Encounter Medications as of 10/18/2020  Medication Sig   ACCU-CHEK FASTCLIX LANCETS MISC yse as directed twice a day   albuterol (PROAIR HFA) 108 (90 Base) MCG/ACT inhaler Inhale 2-4 puffs by mouth Every 4-6 hours as needed for wheezing, cough, and/or shortness of breath   aspirin EC 81 MG tablet Take 81 mg by mouth [name] at 3 pm.    atorvastatin (LIPITOR) 40 MG tablet Take 1 tablet (40 mg total) by mouth [name].   Budeson-Glycopyrrol-Formoterol (BREZTRI AEROSPHERE) 160-9-4.8 MCG/ACT AERO Inhale 2 puffs into the lungs 2 (two) times [name].   buPROPion (WELLBUTRIN SR) 150 MG 12 hr tablet Take 1 tablet (150 mg total) by mouth 2 (two) times [name].   Continuous Blood Gluc Receiver (FREESTYLE LIBRE 2 READER) DEVI Use as directed to read blood sugar 4 times a day  Dx E11.65   Continuous Blood Gluc Sensor (FREESTYLE LIBRE 14 DAY SENSOR) MISC 1 each by Does not apply route every 14 (fourteen) days. Apply to skin every 14 days E11.65   dapagliflozin propanediol (FARXIGA) 10 MG TABS tablet Take 1 tablet (10 mg total) by mouth  [name] before breakfast.   Dulaglutide 1.5 MG/0.5ML SOPN Inject 1.5 mg into the skin once a week.   DULoxetine (CYMBALTA) 60 MG capsule Take 1 capsule (60 mg total) by mouth [name].   ezetimibe (ZETIA) 10 MG tablet Take 1 tablet (10 mg total) by mouth [name].   glucose blood (ACCU-CHEK AVIVA PLUS) test strip Blood sugar testing TID and as needed. E11.65   hydrOXYzine (ATARAX/VISTARIL) 10 MG tablet Take 1-2 tablets (10-20 mg total) by mouth at bedtime as needed. FOR SLEEP   insulin glargine, 2 Unit Dial, (TOUJEO MAX SOLOSTAR) 300 UNIT/ML  Solostar Pen Inject 50 Units into the skin [name]. May decrease to 40 units at bedtime if having episodes of hypoglycemia.   Insulin Pen Needle 32G X 4 MM MISC Use with [name] dose insulin and with sliding scale insulin as needed.   MELATONIN PO Take 10 mg by mouth [name].   metoprolol tartrate (LOPRESSOR) 100 MG tablet Take 1 tablet (100 mg total) by mouth 2 (two) times [name].   mupirocin ointment (BACTROBAN) 2 % Apply 1 application topically 2 (two) times [name]. Apply to affected area For 7 days   nitroGLYCERIN (NITROSTAT) 0.4 MG SL tablet Place 1 tablet (0.4 mg total) under the tongue every 5 (five) minutes x 3 doses as needed for chest pain.   pantoprazole (PROTONIX) 40 MG tablet Take 1 tablet (40 mg total) by mouth [name].   polyethylene glycol (MIRALAX) 17 g packet Take 17 g by mouth [name].   temazepam (RESTORIL) 30 MG capsule Take 1 capsule (30 mg total) by mouth at bedtime.   [DISCONTINUED] famotidine (PEPCID) 20 MG tablet Take 1 tablet (20 mg total) by mouth 2 (two) times [name].   colestipol (COLESTID) 1 g tablet Take 2 tablets (2 g total) by mouth [name] for 30 doses.   famotidine (PEPCID) 20 MG tablet Take 1 tablet (20 mg total) by mouth 2 (two) times [name].   [DISCONTINUED] atorvastatin (LIPITOR) 10 MG tablet Take 1 tablet (10 mg total) by mouth [name].   [DISCONTINUED] DULoxetine (CYMBALTA) 60 MG capsule Take 1 capsule (60 mg total) by mouth [name].   [DISCONTINUED] hydrOXYzine (ATARAX/VISTARIL) 10 MG tablet Take 1-2 tablets (10-20 mg total) by mouth at bedtime as needed. FOR SLEEP   [DISCONTINUED] insulin glargine, 2 Unit Dial, (TOUJEO MAX SOLOSTAR) 300 UNIT/ML Solostar Pen Inject 70 Units into the skin [name]. Patient to increase dose to 70 units at bedtime   No facility-administered encounter medications on file as of 10/18/2020.    Surgical History: Past Surgical History:  Procedure Laterality Date    ingrown toenail removal     CHOLECYSTECTOMY N/A 01/23/2019   Procedure: LAPAROSCOPIC  CHOLECYSTECTOMY;  Surgeon: Olean Ree, MD;  Location: [facility] ORS;  Service: General;  Laterality: N/A;   CLAVICLE SURGERY Right    COLONOSCOPY  2017   COLONOSCOPY WITH PROPOFOL N/A 08/25/2020   Procedure: COLONOSCOPY WITH PROPOFOL;  Surgeon: Virgel Manifold, MD;  Location: [facility] ENDOSCOPY;  Service: Endoscopy;  Laterality: N/A;   ESOPHAGOGASTRODUODENOSCOPY (EGD) WITH PROPOFOL N/A 08/25/2020   Procedure: ESOPHAGOGASTRODUODENOSCOPY (EGD) WITH PROPOFOL;  Surgeon: Virgel Manifold, MD;  Location: [facility] ENDOSCOPY;  Service: Endoscopy;  Laterality: N/A;   MOUTH SURGERY     REMOVED ALL TEETH   SHOULDER SURGERY Right    UPPER GI ENDOSCOPY  2017    Medical History: Past Medical History:  Diagnosis Date   Anxiety    Arthritis    NECK   Bronchitis    Coronary artery disease  Depression    Diabetes mellitus without complication ([place])    Diastasis of rectus abdominis 06/19/2018   Dyspnea    DUE TO GALLBLADDER PER PT   Emphysema lung (HCC)    Family history of adverse reaction to anesthesia    N/V, TROUBLE BREATHING COMING OUT OF ANESTHESIA   Fatty liver    GERD (gastroesophageal reflux disease)    Headache    MIGRAINES   Hyperlipidemia    Hypertension    Irregular heart beat unk   Myocardial infarction ([place]) 2010   MILD    PTSD (post-traumatic stress disorder)    Sleep apnea    USES CPAP   Tachycardia     Family History: Family History  Problem Relation Age of Onset   Hypertension Mother    Diabetes type II Mother    Diabetes Mother    COPD Father    Cancer Father    Heart disease Father    Diabetes Sister    Anxiety disorder Sister    Depression Sister    Diabetes Brother    Anxiety disorder Brother    Depression Brother    Diabetes Maternal Aunt    Diabetes Sister    Anxiety disorder Sister    Depression Sister    Diabetes Sister    Anxiety disorder Sister    Depression Sister    Diabetes Sister    Anxiety disorder Sister    Depression Sister    Colon  cancer Maternal Uncle     Social History   Socioeconomic History   Marital status: Divorced    Spouse name: Not on file   Number of children: 4   Years of education: Not on file   Highest education level: 8th grade  Occupational History   Not on file  Tobacco Use   Smoking status: Every Day    Packs/day: 1.00    Years: 40.00    Pack years: 40.00    Types: Cigarettes   Smokeless tobacco: Former    Types: Chew   Tobacco comments:    1 pack per day reported 04/07/2020  Vaping Use   Vaping Use: Former  Substance and Sexual Activity   Alcohol use: No    Alcohol/week: 0.0 standard drinks   Drug use: No   Sexual activity: Not Currently  Other Topics Concern   Not on file  Social History Narrative   Not on file   Social Determinants of Health   Financial Resource Strain: Low Risk    Difficulty of Paying Living Expenses: Not very hard  Food Insecurity: Not on file  Transportation Needs: Not on file  Physical Activity: Not on file  Stress: Not on file  Social Connections: Not on file  Intimate Partner Violence: Not on file      Review of Systems  Constitutional:  Negative for activity change, appetite change, chills, fatigue, fever and unexpected weight change.  HENT: Negative.  Negative for congestion, ear pain, rhinorrhea, sore throat and trouble swallowing.   Eyes: Negative.   Respiratory: Negative.  Negative for cough, chest tightness, shortness of breath and wheezing.   Cardiovascular: Negative.  Negative for chest pain.  Gastrointestinal: Negative.  Negative for abdominal pain, blood in stool, constipation, diarrhea, nausea and vomiting.  Endocrine: Negative.   Genitourinary: Negative.  Negative for difficulty urinating, dysuria, frequency, hematuria and urgency.  Musculoskeletal: Negative.  Negative for arthralgias, back pain, joint swelling, myalgias and neck pain.  Skin: Negative.  Negative for rash and wound.  Allergic/Immunologic:  Negative.  Negative for  immunocompromised state.  Neurological: Negative.  Negative for dizziness, seizures, numbness and headaches.  Hematological: Negative.   Psychiatric/Behavioral: Negative.  Negative for behavioral problems, self-injury and suicidal ideas. The patient is not nervous/anxious.    Vital Signs: BP 117/82   Pulse 98   Temp 98.3 F (36.8 C)   Resp 16   Ht 5\' 9"  (1.753 m)   Wt 229 lb 3.2 oz (104 kg)   SpO2 94%   BMI 33.85 kg/m    Physical Exam Vitals reviewed.  Constitutional:      General: He is not in acute distress.    Appearance: Normal appearance. He is obese. He is not ill-appearing.  HENT:     Head: Normocephalic and atraumatic.     Right Ear: Tympanic membrane, ear canal and external ear normal.     Left Ear: Tympanic membrane, ear canal and external ear normal.     Nose: Nose normal. No congestion or rhinorrhea.     Mouth/Throat:     Mouth: Mucous membranes are moist.     Pharynx: Oropharynx is clear. No oropharyngeal exudate or posterior oropharyngeal erythema.  Eyes:     Extraocular Movements: Extraocular movements intact.     Conjunctiva/sclera: Conjunctivae normal.     Pupils: Pupils are equal, round, and reactive to light.  Neck:     Vascular: No carotid bruit.  Cardiovascular:     Rate and Rhythm: Normal rate and regular rhythm.     Pulses: Normal pulses.     Heart sounds: Normal heart sounds.  Pulmonary:     Effort: Pulmonary effort is normal. No respiratory distress.     Breath sounds: Normal breath sounds. No wheezing.  Abdominal:     General: Bowel sounds are normal. There is distension (due to constipation).     Palpations: Abdomen is soft. There is no mass.     Tenderness: There is abdominal tenderness (reports constipation and feeling bloated). There is no guarding or rebound.     Hernia: No hernia is present.  Musculoskeletal:        General: Normal range of motion.     Cervical back: Normal range of motion and neck supple. No rigidity.     Right  foot: Normal range of motion. No deformity, bunion or foot drop.     Left foot: Normal range of motion. No deformity, bunion or foot drop.  Feet:     Right foot:     Protective Sensation: 6 sites tested.  4 sites sensed.     Skin integrity: Callus and dry skin present. No ulcer, blister or erythema.     Toenail Condition: Right toenails are abnormally thick.     Left foot:     Protective Sensation: 6 sites tested.  3 sites sensed.     Skin integrity: Callus and dry skin present. No ulcer, blister or erythema.     Toenail Condition: Left toenails are abnormally thick.  Lymphadenopathy:     Cervical: No cervical adenopathy.  Skin:    General: Skin is warm and dry.     Capillary Refill: Capillary refill takes less than 2 seconds.  Neurological:     Mental Status: He is alert and oriented to person, place, and time.  Psychiatric:        Mood and Affect: Mood normal.        Behavior: Behavior normal.        Thought Content: Thought content normal.  Judgment: Judgment normal.       Assessment/Plan: 1. Encounter for general adult medical examination with abnormal findings Age-appropriate preventive screenings discussed, annual physical exam completed. He has an echocardiogram scheduled on 11/03/20. He has his screening colonoscopy done earlier this year. His CT chest for lung cancer screening was done in April 2022. His diabetic foot exam was done during his office visit today and he sees his podiatrist regularly. He has declined the shingles and pneumococcal vaccines for now.   His ophthalmology exam is due in August this year. And his A1C was checked today. Labs were recently done in April 2022, do not need labs at this time.   2. Uncontrolled type 2 diabetes mellitus with hyperglycemia (HCC) Bonham has made significant improvement in his compliance with medication and treatment regimen as well as with his A1C. His Trulicity dose was increased at his last office visit in may 2022  and he is currently taking farxiga 10 mg [name]. His Toujeo dose is at 50 units [name] at bedtime. He has been having some issues with the [facility] 2 sensor not reading accurately and solutions to troubleshooting the sensor were discussed. Dehydration can cause the sensor to read inaccurately so adequate hydration was encouraged. Currently his sensor is insert into the skin through an area that is covered in tattoo ink, unsure as to whether this could cause the sensor to be inaccurate, Layson will try making sure he is adequately hydrated and also that the sensor is inserted into skin without tattoo ink. No changes in current treatment regimen. His A1C is now 8.7 which decreased by 2.0. Will recheck A1C in 3 months.  - POCT glycosylated hemoglobin (Hb A1C)  3. Pulmonary emphysema, unspecified emphysema type (HCC) Bria started using Breztri at his last office visit. Samples were provided and the prescription was sent to the pharmacy. He reports significant improvement, decreased wheezing and SOB. He has not needed to use his rescue inhaler since starting Breztri.   4. Aortic atherosclerosis ([place]) Apostolos has an echocardiogram scheduled for 11/03/2020. He has no record of a carotid ultrasound. Will order a carotid ultrasound to establish baseline and rule out carotid stenosis. No carotid bruit auscultated bilaterally.   5. Essential hypertension Blood pressure is well controlled with current medications.   6. OSA on CPAP Stable, using CPAP at night.   7. Drug-induced constipation Reports constipation ever since stopping Janumet. Janumet can cause diarrhea while some of the medications he is taking can cause constipation as a side effect. He is not on any stool softener or laxative. Miralax ordered for constipation, explanation of how to take the medication provided to the patient. He verbalized understanding. - polyethylene glycol (MIRALAX) 17 g packet; Take 17 g by mouth [name].  Dispense: 30  each; Refill: 0  8. Blister (nonthermal) of right hand, initial encounter Patient has some blisters that popped on the second finger of his right hand. There is some erythema around the edges. Mupirocin ointment prescribed to treat any possible skin infection that could be starting. If the blisters do not heal or he has more blisters, he was instructed to call the clinic. Will consider culturing one of the blisters to test for infection and HSV. If the blisters reoccur will also consider referral to dermatology if necessary.  - mupirocin ointment (BACTROBAN) 2 %; Apply 1 application topically 2 (two) times [name]. Apply to affected area For 7 days  Dispense: 22 g; Refill: 0  9. Gastroesophageal reflux disease without esophagitis  Takes multiple medications for GERD since his cholecystectomy per GI. Requesting refill of famotidine, order sent to pharmacy.  - famotidine (PEPCID) 20 MG tablet; Take 1 tablet (20 mg total) by mouth 2 (two) times [name].  Dispense: 180 tablet; Refill: 1  10. Generalized anxiety disorder Followed by Dr. Shea Evans for anxiety treatment and medication management. Patient reports his current medications are helping.   11. Mixed hyperlipidemia High ASCVD risk per previous office visit. Atorvastatin was increased to 40 mg [name] and he is tolerating it well. Will recheck lipid panel later in the year.   12. Tobacco use disorder Current every day smoker, 1ppd. Not ready to quit. Will continue to counsel patient on smoking cessation on next follow up visit.   13. Class 1 obesity due to excess calories with serious comorbidity and body mass index (BMI) of 33.0 to 33.9 in adult [place] is working towards decreasing his weight and BMI by working on contributing factor's. He is more compliant with treatment and medications. He is working toward getting his glucose levels under control and has made significant improvement. He has lost 5 lbs since 08/31/20. Current weight is 229 lbs and BMI  is 33.85. Will continue to monitor via follow up office visits.   14. Dysuria Routine urinalysis done.  - UA/M w/rflx Culture, Routine       General Counseling: Hayes verbalizes understanding of the findings of todays visit and agrees with plan of treatment. I have discussed any further diagnostic evaluation that may be needed or ordered today. We also reviewed his medications today. he has been encouraged to call the office with any questions or concerns that should arise related to todays visit.    Orders Placed This Encounter  Procedures   UA/M w/rflx Culture, Routine   POCT glycosylated hemoglobin (Hb A1C)    Meds ordered this encounter  Medications   polyethylene glycol (MIRALAX) 17 g packet    Sig: Take 17 g by mouth [name].    Dispense:  30 each    Refill:  0   famotidine (PEPCID) 20 MG tablet    Sig: Take 1 tablet (20 mg total) by mouth 2 (two) times [name].    Dispense:  180 tablet    Refill:  1    To replace rx for ranitidine   mupirocin ointment (BACTROBAN) 2 %    Sig: Apply 1 application topically 2 (two) times [name]. Apply to affected area For 7 days    Dispense:  22 g    Refill:  0    Return in about 3 months (around 01/18/2021) for F/U, Recheck A1C, [place] Convery PCP.   Total time spent:45 Minutes Time spent includes review of chart, medications, test results, and follow up plan with the patient.   [place] Controlled Substance Database was reviewed by me.  This patient was seen by Jonetta Osgood, FNP-C in collaboration with Dr. Clayborn Bigness as a part of collaborative care agreement.  Love Chowning R. Valetta Fuller, MSN, FNP-C Internal medicine

## 2020-10-19 ENCOUNTER — Ambulatory Visit: Payer: Medicare Other | Admitting: Pharmacist

## 2020-10-19 DIAGNOSIS — E782 Mixed hyperlipidemia: Secondary | ICD-10-CM

## 2020-10-19 DIAGNOSIS — R0602 Shortness of breath: Secondary | ICD-10-CM

## 2020-10-19 DIAGNOSIS — E1165 Type 2 diabetes mellitus with hyperglycemia: Secondary | ICD-10-CM

## 2020-10-19 LAB — UA/M W/RFLX CULTURE, ROUTINE
Bilirubin, UA: NEGATIVE
Ketones, UA: NEGATIVE
Leukocytes,UA: NEGATIVE
Nitrite, UA: NEGATIVE
RBC, UA: NEGATIVE
Specific Gravity, UA: >=1.03 — AB (ref 1.005–1.030)
Urobilinogen, Ur: 0.2 mg/dL (ref 0.2–1.0)
pH, UA: 6.5 (ref 5.0–7.5)

## 2020-10-19 LAB — MICROSCOPIC EXAMINATION
Bacteria, UA: NONE SEEN
Casts: NONE SEEN /lpf
Epithelial Cells (non renal): NONE SEEN /hpf (ref 0–10)
RBC, Urine: NONE SEEN /hpf (ref 0–2)
WBC, UA: NONE SEEN /hpf (ref 0–5)

## 2020-10-19 NOTE — Patient Instructions (Addendum)
Visit Information   Goals Addressed             This Visit's Progress    Monitor and Manage My Blood Sugar-Diabetes Type 2   On track    Timeframe:  Long-Range Goal Priority:  High Start Date:  08/17/20                           Expected End Date:   02/16/21                    Follow Up Date 11/16/20   - check blood sugar at prescribed times - check blood sugar if I feel it is too high or too low - enter blood sugar readings and medication or insulin into daily log - take the blood sugar log to all doctor visits    Why is this important?   Checking your blood sugar at home helps to keep it from getting very high or very low.  Writing the results in a diary or log helps the doctor know how to care for you.  Your blood sugar log should have the time, date and the results.  Also, write down the amount of insulin or other medicine that you take.  Other information, like what you ate, exercise done and how you were feeling, will also be helpful.     Notes: Pharmacist working on CGM from [place].        Patient Care Plan: General Pharmacy (Adult)     Problem Identified: HTN, HLD, DM, GERD. Tobacco use, SOB   Priority: High  Onset Date: 08/17/2020     Long-Range Goal: Patient-Specific Goal   Start Date: 08/17/2020  Expected End Date: 02/16/2021  Recent Progress: On track  Priority: High  Note:   Current Barriers:  Unable to achieve control of glucose.  Does not adhere to prescribed medication regimen  Pharmacist Clinical Goal(s):  Patient will achieve control of glucose as evidenced by A1c testing adhere to plan to optimize therapeutic regimen for diabetes as evidenced by report of adherence to recommended medication management changes adhere to prescribed medication regimen as evidenced by fill dates. through collaboration with PharmD and provider.   Interventions: 1:1 collaboration with Lavera Guise, MD regarding development and update of comprehensive plan of care  as evidenced by provider attestation and co-signature Inter-disciplinary care team collaboration (see longitudinal plan of care) Comprehensive medication review performed; medication list updated in electronic medical record  Hypertension (BP goal <130/80) -Controlled -Current treatment: Metoprolol tartrate 100mg  BID -Medications previously tried: none noted -Current home readings: patient reports most are around 130/80.  Did not have specific logs with him today -Current dietary habits: now using his air fryer a lot more.  He does not fry in butter or oil as much as he used to.  Trying to avoid carbohydrates as much as possible.  He does report sodas are his weakness.  Usually drinks about 5 to 6 sodas per day. -Current exercise habits: will go outside and walk around with his dogs 4 to 5 times per day.  Used to have a membership to [employer] but has not been back since [place]. -Denies hypotensive/hypertensive symptoms -Educated on BP goals and benefits of medications for prevention of heart attack, stroke and kidney damage; Daily salt intake goal < 2300 mg; Exercise goal of 150 minutes per week; Importance of home blood pressure monitoring; -Counseled to monitor BP at home daily, document,  and provide log at future appointments -Recommended to continue current medication Recommended walking plan for 15-20 minutes per day, either outside or back in the gym.  Diabetes (A1c goal <8%) - initial goal.  Would eventually like to see him < 7. -Uncontrolled -Current medications: Farxiga 10mg  daily Toujeo 50 units into the skin hs Trulicity 1.5mg  once weekly -Medications previously tried: [place]  -Current home glucose readings fasting glucose: did not bring logs but reports all his readings have been "high" lately post prandial glucose:  -Reports hypoglycemic/hyperglycemic symptoms - numbess in feet, he sometimes cannot feel his dogs nibbling at his toes -Current meal patterns: SODAS 5 to 6  per day, trying to limit breads with his meats, using air fryer vs eating regular fried foods -Current exercise: walking dogs -Educated on A1c and blood sugar goals; Exercise goal of 150 minutes per week; Benefits of weight loss; Benefits of routine self-monitoring of blood sugar; Continuous glucose monitoring;  -He reports that his blood sugar was really well controlled when he was using the trial of Freestyle libre.  Has been having issues getting this approved and has seen his blood sugar control decline as he often does not prick his finger while he is out and this means he does not take his sliding scale injection. -Also unsure if he should still be taking [place], he reports someone took him off of it but I cannot find documentation of that. -Counseled to check feet daily and get yearly eye exams -Recommended to continue current medication Collaborate with PCP to determine if patient should still be taking [place].  Will work with DME suppliers to get patient [employer] as he qualifies based on number of injections.  Really encouraged patient to find replacement for sodas or at least limit use.  Monitoring plan initiated moving forward.  Update 10/19/20 Fasting sugars have been around 110.  With almost all < 130. Highest sugar he has seen was 210 after eating. He is liking this new combination of medications a lot better.  He did have to stop using Freestyle Libre temporarily due to low alerts that were very different from his manual meter and has not started back using it yet.  All medications currently affordable.  His A1c improved to 8.7!!   He cut out sodas completely and is watching what he eats better.  Recommend continue current meds RTC in 3 months for A1c check. Encouraged physical activity regimen.  Hyperlipidemia: (LDL goal < 70) -Uncontrolled -Current treatment: Atorvastatin 40mg  daily Zetia 10mg  -Medications previously tried: simvastatin  -Current dietary  patterns: see DM -Current exercise habits: minimal -Educated on Cholesterol goals;  Benefits of statin for ASCVD risk reduction; Importance of limiting foods high in cholesterol; Exercise goal of 150 minutes per week; -He is tolerating new medications fine and reports 100% adherence with these -Counseled on diet and exercise extensively Recommended to continue current medication Recommended lipid panel at next OV.   SOB (Goal: Minimize symptoms) -Controlled -Current treatment  Breztri 160-9-4.8 mcg/act 2 puffs twice daily Albuterol HFA 90 mcg prn -Medications previously tried: [employer] -Breathing is much better since starting Breztri, has not had to use his rescue inhaler hardly at all.  Denies any recent SOB.  -Recommended to continue current medication Counseled on use of maintenance inhaler and to rinse mouth after use.  Tobacco use (Goal Smoking cessation) -Uncontrolled -Previous quit attempts: none -Current treatment  Bupropoion SR 150mg  BID -Patient triggers include: boredom -Patient smoking 1.5 ppd currently.  He wants to quit  but has not had luck with patch or gum.  Is allergic to adhesive on patch. -Recommended hard candies, straws, etc to use as a replacelement.  GERD (Goal: Minimize symptoms) -Controlled -Current treatment  Pantoprazole 40mg  daily Famotidine 20mg  BID -Medications previously tried: none noted -Recently discovered he has hiatal hernia. -Denies any symptoms except for when he lays down at night.   -Recommended to continue current medication Recommended elevat head of the bed when he lays down.   Patient Goals/Self-Care Activities Patient will:  - take medications as prescribed focus on medication adherence by pill count check glucose daily, document, and provide at future appointments check blood pressure daily, document, and provide at future appointments engage in dietary modifications by limiting soda intake  Follow Up Plan: The care  management team will reach out to the patient again over the next 90 days.             Patient verbalizes understanding of instructions provided today and agrees to view in [place].  Telephone follow up appointment with pharmacy team member scheduled for: 3 months  Edythe Clarity, [facility]

## 2020-10-21 DIAGNOSIS — K219 Gastro-esophageal reflux disease without esophagitis: Secondary | ICD-10-CM | POA: Diagnosis not present

## 2020-10-21 DIAGNOSIS — I10 Essential (primary) hypertension: Secondary | ICD-10-CM | POA: Diagnosis not present

## 2020-10-25 DIAGNOSIS — Z794 Long term (current) use of insulin: Secondary | ICD-10-CM | POA: Diagnosis not present

## 2020-10-25 DIAGNOSIS — E119 Type 2 diabetes mellitus without complications: Secondary | ICD-10-CM | POA: Diagnosis not present

## 2020-11-03 ENCOUNTER — Other Ambulatory Visit: Payer: Medicare Other

## 2020-11-05 ENCOUNTER — Ambulatory Visit: Payer: Medicare Other | Admitting: Psychiatry

## 2020-11-10 ENCOUNTER — Other Ambulatory Visit: Payer: Self-pay | Admitting: Psychiatry

## 2020-11-10 DIAGNOSIS — F5105 Insomnia due to other mental disorder: Secondary | ICD-10-CM

## 2020-11-11 ENCOUNTER — Ambulatory Visit: Payer: Medicare Other | Admitting: Gastroenterology

## 2020-11-11 ENCOUNTER — Encounter: Payer: Self-pay | Admitting: *Deleted

## 2020-11-20 ENCOUNTER — Other Ambulatory Visit: Payer: Self-pay | Admitting: Nurse Practitioner

## 2020-11-20 DIAGNOSIS — E1165 Type 2 diabetes mellitus with hyperglycemia: Secondary | ICD-10-CM

## 2020-11-22 ENCOUNTER — Encounter: Payer: Self-pay | Admitting: Psychiatry

## 2020-11-22 ENCOUNTER — Ambulatory Visit (INDEPENDENT_AMBULATORY_CARE_PROVIDER_SITE_OTHER): Payer: Medicare Other | Admitting: Psychiatry

## 2020-11-22 ENCOUNTER — Other Ambulatory Visit: Payer: Self-pay

## 2020-11-22 VITALS — BP 126/81 | HR 94 | Temp 97.7°F | Wt 227.2 lb

## 2020-11-22 DIAGNOSIS — F3342 Major depressive disorder, recurrent, in full remission: Secondary | ICD-10-CM

## 2020-11-22 DIAGNOSIS — F17200 Nicotine dependence, unspecified, uncomplicated: Secondary | ICD-10-CM | POA: Diagnosis not present

## 2020-11-22 DIAGNOSIS — F4310 Post-traumatic stress disorder, unspecified: Secondary | ICD-10-CM

## 2020-11-22 DIAGNOSIS — G4701 Insomnia due to medical condition: Secondary | ICD-10-CM | POA: Diagnosis not present

## 2020-11-22 MED ORDER — DULOXETINE HCL 60 MG PO CPEP: 60.0000 mg | ORAL_CAPSULE | Freq: Every day | ORAL | 0 refills | Status: DC

## 2020-11-22 MED ORDER — BUPROPION HCL ER (XL) 150 MG PO TB24: 150.0000 mg | ORAL_TABLET | Freq: Every day | ORAL | 0 refills | Status: DC

## 2020-11-22 NOTE — Progress Notes (Signed)
[facility] MD OP Progress Note  11/22/2020 6:14 PM Raymond [name]  MRN:  [id number]  Chief Complaint:  Chief Complaint   Follow-up; Depression; Anxiety    HPI: Raymond [name] is a 55 year old male, divorced, has a history of MDD, PTSD, tobacco use disorder, insomnia, neuropathy, uncontrolled diabetes, hyperlipidemia, hepatic steatosis, lives in [place] was evaluated in office today.  Patient today reports he does not feel depressed or anxious and feels his mood symptoms are stable.  Patient however reports he continues to feel tired, fatigued during the day.  He sleeps through the night and he has no difficulty falling asleep however once he wakes up he feels extremely tired. Patient reports he does use his CPAP properly and regularly.Patient however continues to have multiple medical problems including uncontrolled diabetes, neuropathy and is on multiple medications.  He denies any suicidality or homicidality.  Patient denies any perceptual disturbances.  Patient continues to smoke cigarettes and reports smoking may have worsened.  Patient denies any other concerns today.  Visit Diagnosis:    ICD-10-CM   1. MDD (major depressive disorder), recurrent, in full remission ([place])  F33.42 buPROPion (WELLBUTRIN XL) 150 MG 24 hr tablet    DULoxetine (CYMBALTA) 60 MG capsule    2. PTSD (post-traumatic stress disorder)  F43.10 DULoxetine (CYMBALTA) 60 MG capsule    3. Tobacco use disorder  F17.200     4. Insomnia due to medical condition  G47.01    Sleep apnea, mood      Past Psychiatric History: Reviewed past psychiatric history from progress note on 05/23/2018.  Past trials of Prozac, Zoloft, Celexa, Tegretol, doxepin, Klonopin, Seroquel, mirtazapine, Belsomra, Lunesta, Ativan, Rozerem, Lexapro  Past Medical History:  Past Medical History:  Diagnosis Date   Anxiety    Arthritis    NECK   Bronchitis    Coronary artery disease    Depression    Diabetes mellitus without  complication ([place])    Diastasis of rectus abdominis 06/19/2018   Dyspnea    DUE TO GALLBLADDER PER PT   Emphysema lung ([place])    Family history of adverse reaction to anesthesia    N/V, TROUBLE BREATHING COMING OUT OF ANESTHESIA   Fatty liver    GERD (gastroesophageal reflux disease)    Headache    MIGRAINES   Hyperlipidemia    Hypertension    Irregular heart beat unk   Myocardial infarction ([place]) 2010   MILD    PTSD (post-traumatic stress disorder)    Sleep apnea    USES CPAP   Tachycardia     Past Surgical History:  Procedure Laterality Date    ingrown toenail removal     CHOLECYSTECTOMY N/A 01/23/2019   Procedure: LAPAROSCOPIC CHOLECYSTECTOMY;  Surgeon: Olean Ree, MD;  Location: [facility] ORS;  Service: General;  Laterality: N/A;   CLAVICLE SURGERY Right    COLONOSCOPY  2017   COLONOSCOPY WITH PROPOFOL N/A 08/25/2020   Procedure: COLONOSCOPY WITH PROPOFOL;  Surgeon: Virgel Manifold, MD;  Location: [facility] ENDOSCOPY;  Service: Endoscopy;  Laterality: N/A;   ESOPHAGOGASTRODUODENOSCOPY (EGD) WITH PROPOFOL N/A 08/25/2020   Procedure: ESOPHAGOGASTRODUODENOSCOPY (EGD) WITH PROPOFOL;  Surgeon: Virgel Manifold, MD;  Location: [facility] ENDOSCOPY;  Service: Endoscopy;  Laterality: N/A;   MOUTH SURGERY     REMOVED ALL TEETH   SHOULDER SURGERY Right    UPPER GI ENDOSCOPY  2017    Family Psychiatric History: Reviewed family psychiatric history from progress note on 05/23/2018  Family History:  Family History  Problem Relation  Age of Onset   Hypertension Mother    Diabetes type II Mother    Diabetes Mother    COPD Father    Cancer Father    Heart disease Father    Diabetes Sister    Anxiety disorder Sister    Depression Sister    Diabetes Brother    Anxiety disorder Brother    Depression Brother    Diabetes Maternal Aunt    Diabetes Sister    Anxiety disorder Sister    Depression Sister    Diabetes Sister    Anxiety disorder Sister    Depression Sister    Diabetes Sister     Anxiety disorder Sister    Depression Sister    Colon cancer Maternal Uncle     Social History: Reviewed social history from progress note on 05/23/2018 Social History   Socioeconomic History   Marital status: Divorced    Spouse name: Not on file   Number of children: 4   Years of education: Not on file   Highest education level: 8th grade  Occupational History   Not on file  Tobacco Use   Smoking status: Every Day    Packs/day: 1.00    Years: 40.00    Pack years: 40.00    Types: Cigarettes   Smokeless tobacco: Former    Types: Chew   Tobacco comments:    1 pack per day reported 04/07/2020  Vaping Use   Vaping Use: Former  Substance and Sexual Activity   Alcohol use: No    Alcohol/week: 0.0 standard drinks   Drug use: No   Sexual activity: Not Currently  Other Topics Concern   Not on file  Social History Narrative   Not on file   Social Determinants of Health   Financial Resource Strain: Low Risk    Difficulty of Paying Living Expenses: Not very hard  Food Insecurity: Not on file  Transportation Needs: Not on file  Physical Activity: Not on file  Stress: Not on file  Social Connections: Not on file    Allergies:  Allergies  Allergen Reactions   Onion Anaphylaxis   Hydrocodone    Norco [Hydrocodone-Acetaminophen] Itching   Hydrocodone-Acetaminophen Itching   Nickel Rash    Metabolic Disorder Labs: Lab Results  Component Value Date   HGBA1C 8.7 (A) 10/18/2020   MPG 294.83 05/31/2017   No results found for: PROLACTIN Lab Results  Component Value Date   CHOL 280 (H) 08/17/2020   TRIG 501 (H) 08/17/2020   HDL 31 (L) 08/17/2020   CHOLHDL 4.8 07/15/2015   VLDL 26 12/09/2012   LDLCALC 153 (H) 08/17/2020   LDLCALC 61 07/15/2015   Lab Results  Component Value Date   TSH 1.890 08/17/2020   TSH 4.120 06/10/2018    Therapeutic Level Labs: No results found for: LITHIUM No results found for: VALPROATE No components found for:  CBMZ  Current  Medications: Current Outpatient Medications  Medication Sig Dispense Refill   ACCU-CHEK FASTCLIX LANCETS MISC yse as directed twice a day 100 each 1   albuterol (PROAIR HFA) 108 (90 Base) MCG/ACT inhaler Inhale 2-4 puffs by mouth Every 4-6 hours as needed for wheezing, cough, and/or shortness of breath 3 Inhaler 1   aspirin EC 81 MG tablet Take 81 mg by mouth daily at 3 pm.      atorvastatin (LIPITOR) 40 MG tablet Take 1 tablet (40 mg total) by mouth daily. 90 tablet 3   Budeson-Glycopyrrol-Formoterol (BREZTRI AEROSPHERE) 160-9-4.8 MCG/ACT AERO Inhale  2 puffs into the lungs 2 (two) times daily. 10.7 g 11   buPROPion (WELLBUTRIN XL) 150 MG 24 hr tablet Take 1 tablet (150 mg total) by mouth daily. Dose change 90 tablet 0   Continuous Blood Gluc Receiver (FREESTYLE LIBRE 2 READER) DEVI Use as directed to read blood sugar 4 times a day  Dx E11.65 1 each 5   Continuous Blood Gluc Sensor (FREESTYLE LIBRE 14 DAY SENSOR) MISC 1 each by Does not apply route every 14 (fourteen) days. Apply to skin every 14 days E11.65 6 each 12   dapagliflozin propanediol (FARXIGA) 10 MG TABS tablet Take 1 tablet (10 mg total) by mouth daily before breakfast. 30 tablet 1   ezetimibe (ZETIA) 10 MG tablet Take 1 tablet (10 mg total) by mouth daily. 30 tablet 2   famotidine (PEPCID) 20 MG tablet Take 1 tablet (20 mg total) by mouth 2 (two) times daily. 180 tablet 1   glucose blood (ACCU-CHEK AVIVA PLUS) test strip Blood sugar testing TID and as needed. E11.65 100 each 12   hydrOXYzine (ATARAX/VISTARIL) 10 MG tablet Take 1-2 tablets (10-20 mg total) by mouth at bedtime as needed. FOR SLEEP 30 tablet 2   insulin glargine, 2 Unit Dial, (TOUJEO MAX SOLOSTAR) 300 UNIT/ML Solostar Pen Inject 50 Units into the skin daily. May decrease to 40 units at bedtime if having episodes of hypoglycemia. 10 mL 3   Insulin Pen Needle 32G X 4 MM MISC Use with daily dose insulin and with sliding scale insulin as needed. 100 each 5   MELATONIN PO  Take 10 mg by mouth daily.     metoprolol tartrate (LOPRESSOR) 100 MG tablet Take 1 tablet (100 mg total) by mouth 2 (two) times daily. 180 tablet 1   mupirocin ointment (BACTROBAN) 2 % Apply 1 application topically 2 (two) times daily. Apply to affected area For 7 days 22 g 0   nitroGLYCERIN (NITROSTAT) 0.4 MG SL tablet Place 1 tablet (0.4 mg total) under the tongue every 5 (five) minutes x 3 doses as needed for chest pain. 30 tablet 1   pantoprazole (PROTONIX) 40 MG tablet Take 1 tablet (40 mg total) by mouth daily. 90 tablet 1   polyethylene glycol (MIRALAX) 17 g packet Take 17 g by mouth daily. 30 each 0   temazepam (RESTORIL) 30 MG capsule Take 1 capsule by mouth at bedtime 30 capsule 1   colestipol (COLESTID) 1 g tablet Take 2 tablets (2 g total) by mouth daily for 30 doses. 60 tablet 2   DULoxetine (CYMBALTA) 60 MG capsule Take 1 capsule (60 mg total) by mouth daily. 90 capsule 0   TRULICITY 1.5 0000000 SOPN INJECT ONE SYRINGEFUL INTO THE SKIN ONCE WEEKLY 4 mL 0   No current facility-administered medications for this visit.     Musculoskeletal: Strength & Muscle Tone: within normal limits Gait & Station: normal Patient leans: N/A  Psychiatric Specialty Exam: Review of Systems  Constitutional:  Positive for fatigue.  Musculoskeletal:        Pain - foot  Psychiatric/Behavioral: Negative.  Negative for agitation, behavioral problems, confusion, decreased concentration, dysphoric mood, hallucinations, self-injury, sleep disturbance (Patient reports sleep is good however wakes up feeling tired.) and suicidal ideas. The patient is not nervous/anxious and is not hyperactive.   All other systems reviewed and are negative.  Blood pressure 126/81, pulse 94, temperature 97.7 F (36.5 C), temperature source Temporal, weight 227 lb 3.2 oz (103.1 kg).Body mass index is 33.55 kg/m.  General  Appearance: Casual  Eye Contact:  Good  Speech:  Clear and Coherent  Volume:  Normal  Mood:   Euthymic  Affect:  Congruent  Thought Process:  Goal Directed and Descriptions of Associations: Intact  Orientation:  Full (Time, Place, and Person)  Thought Content: Logical   Suicidal Thoughts:  No  Homicidal Thoughts:  No  Memory:  Immediate;   Fair Recent;   Fair Remote;   Fair  Judgement:  Fair  Insight:  Good  Psychomotor Activity:  Normal  Concentration:  Concentration: Fair and Attention Span: Fair  Recall:  [employer] of Knowledge: Fair  Language: Fair  Akathisia:  No  Handed:  Right  AIMS (if indicated): done  Assets:  Communication Skills Desire for Improvement Housing  ADL's:  Intact  Cognition: WNL  Sleep:  Fair   Screenings: GAD-7    Flowsheet Row Office Visit from 11/22/2020 in [place]  Total GAD-7 Score 11      [place] from 10/18/2020 in [facility], [place] from 10/17/2019 in [facility], [place] from 10/08/2018 in [facility], [place] from 10/05/2017 in [facility], [facility]  Total Score (max 30 points ) '29 30 27 30      '$ PHQ2-9    [place] Visit from 11/22/2020 in [place] from 10/18/2020 in [facility], [place] from 09/21/2020 in [facility], [place] from 08/31/2020 in [facility], [place] from 07/12/2020 in [facility] [place], [facility]  PHQ-2 Total Score 4 0 0 0 0  PHQ-9 Total Score 15 -- -- -- --      [place] Visit from 11/22/2020 in [place] Admission (Discharged) from 08/25/2020 in [place] Video Visit from 06/23/2020 in [place] No Risk No Risk Low Risk        Assessment and Plan: Raymond [name] is a 55 year old Caucasian male, lives in  [place], divorced, disabled, has a history of PTSD, MDD, insomnia, diabetes melitis, history of vision loss, neuropathy was evaluated in office today.  Patient is currently struggling with fatigue, tiredness unknown if this is due to problems with his sleep quality.  Patient will benefit from the following plan.  Plan MDD in remission Cymbalta 60 mg p.o. daily Continue Wellbutrin as prescribed  Insomnia-unstable We will reduce Wellbutrin to Wellbutrin XL 150 mg p.o. daily with breakfast. Unknown if Wellbutrin twice a day dosage likely contributing to sleep problems. Continue CPAP for OSA.  We will consider sending patient for a repeat sleep study as needed Hydroxyzine 10 to 20 mg p.o. nightly as needed  PTSD-stable Cymbalta 60 mg p.o. daily  Tobacco use disorder-unstable Provided counseling for 5 minutes Wellbutrin currently not helpful with smoking cessation. We will reduce Wellbutrin dosage.  Patient will continue to need sufficient pain management.   Follow-up in clinic in 3 to 4 weeks or sooner if needed.  This note was generated in part or whole with voice recognition software. Voice recognition is usually quite accurate but there are transcription errors that can and very often do occur. I apologize for any typographical errors that were not detected and corrected.         Ursula Alert, MD 11/23/2020, 12:11 PM

## 2020-11-22 NOTE — Patient Instructions (Signed)
Insomnia Insomnia is a sleep disorder that makes it difficult to fall asleep or stay asleep. Insomnia can cause fatigue, low energy, difficulty concentrating, moodswings, and poor performance at work or school. There are three different ways to classify insomnia: Difficulty falling asleep. Difficulty staying asleep. Waking up too early in the morning. Any type of insomnia can be long-term (chronic) or short-term (acute). Both are common. Short-term insomnia usually lasts for three months or less. Chronic insomnia occurs at least three times a week for longer than threemonths. What are the causes? Insomnia may be caused by another condition, situation, or substance, such as: Anxiety. Certain medicines. Gastroesophageal reflux disease (GERD) or other gastrointestinal conditions. Asthma or other breathing conditions. Restless legs syndrome, sleep apnea, or other sleep disorders. Chronic pain. Menopause. Stroke. Abuse of alcohol, tobacco, or illegal drugs. Mental health conditions, such as depression. Caffeine. Neurological disorders, such as Alzheimer's disease. An overactive thyroid (hyperthyroidism). Sometimes, the cause of insomnia may not be known. What increases the risk? Risk factors for insomnia include: Gender. Women are affected more often than men. Age. Insomnia is more common as you get older. Stress. Lack of exercise. Irregular work schedule or working night shifts. Traveling between different time zones. Certain medical and mental health conditions. What are the signs or symptoms? If you have insomnia, the main symptom is having trouble falling asleep or having trouble staying asleep. This may lead to other symptoms, such as: Feeling fatigued or having low energy. Feeling nervous about going to sleep. Not feeling rested in the morning. Having trouble concentrating. Feeling irritable, anxious, or depressed. How is this diagnosed? This condition may be diagnosed based  on: Your symptoms and medical history. Your health care provider may ask about: Your sleep habits. Any medical conditions you have. Your mental health. A physical exam. How is this treated? Treatment for insomnia depends on the cause. Treatment may focus on treating an underlying condition that is causing insomnia. Treatment may also include: Medicines to help you sleep. Counseling or therapy. Lifestyle adjustments to help you sleep better. Follow these instructions at home: Eating and drinking  Limit or avoid alcohol, caffeinated beverages, and cigarettes, especially close to bedtime. These can disrupt your sleep. Do not eat a large meal or eat spicy foods right before bedtime. This can lead to digestive discomfort that can make it hard for you to sleep.  Sleep habits  Keep a sleep diary to help you and your health care provider figure out what could be causing your insomnia. Write down: When you sleep. When you wake up during the night. How well you sleep. How rested you feel the next day. Any side effects of medicines you are taking. What you eat and drink. Make your bedroom a dark, comfortable place where it is easy to fall asleep. Put up shades or blackout curtains to block light from outside. Use a white noise machine to block noise. Keep the temperature cool. Limit screen use before bedtime. This includes: Watching TV. Using your smartphone, tablet, or computer. Stick to a routine that includes going to bed and waking up at the same times every day and night. This can help you fall asleep faster. Consider making a quiet activity, such as reading, part of your nighttime routine. Try to avoid taking naps during the day so that you sleep better at night. Get out of bed if you are still awake after 15 minutes of trying to sleep. Keep the lights down, but try reading or doing a quiet   activity. When you feel sleepy, go back to bed.  General instructions Take over-the-counter  and prescription medicines only as told by your health care provider. Exercise regularly, as told by your health care provider. Avoid exercise starting several hours before bedtime. Use relaxation techniques to manage stress. Ask your health care provider to suggest some techniques that may work well for you. These may include: Breathing exercises. Routines to release muscle tension. Visualizing peaceful scenes. Make sure that you drive carefully. Avoid driving if you feel very sleepy. Keep all follow-up visits as told by your health care provider. This is important. Contact a health care provider if: You are tired throughout the day. You have trouble in your daily routine due to sleepiness. You continue to have sleep problems, or your sleep problems get worse. Get help right away if: You have serious thoughts about hurting yourself or someone else. If you ever feel like you may hurt yourself or others, or have thoughts about taking your own life, get help right away. You can go to your nearest emergency department or call: Your local emergency services (911 in the [place] A suicide crisis helpline, such as the National Suicide Prevention Lifeline at 1-800-273-8255. This is open 24 hours a day. Summary Insomnia is a sleep disorder that makes it difficult to fall asleep or stay asleep. Insomnia can be long-term (chronic) or short-term (acute). Treatment for insomnia depends on the cause. Treatment may focus on treating an underlying condition that is causing insomnia. Keep a sleep diary to help you and your health care provider figure out what could be causing your insomnia. This information is not intended to replace advice given to you by your health care provider. Make sure you discuss any questions you have with your healthcare provider. Document Revised: 02/19/2020 Document Reviewed: 02/19/2020 Elsevier Patient Education  2022 [employer]  

## 2020-11-29 ENCOUNTER — Ambulatory Visit: Payer: Medicare Other | Admitting: Internal Medicine

## 2020-12-01 ENCOUNTER — Other Ambulatory Visit: Payer: Medicare Other

## 2020-12-02 ENCOUNTER — Telehealth: Payer: Self-pay | Admitting: Pharmacist

## 2020-12-02 NOTE — Progress Notes (Addendum)
Chronic Care Management Pharmacy Assistant   Name: Raymond [name]  MRN: [id number] DOB: [birth year]  Reason for Encounter: Disease State For DM.    Conditions to be addressed/monitored: HTN, GERD, Type II DM w/neuropathy, Depression, Tobacco use disorder  Recent office visits:  None since 10/19/20  Recent consult visits:  11/22/20 [facility] Ursula Alert, MD. For depression. No information given.   Hospital visits:  None since 10/19/20  Medications: Outpatient Encounter Medications as of 12/02/2020  Medication Sig   ACCU-CHEK FASTCLIX LANCETS MISC yse as directed twice a day   albuterol (PROAIR HFA) 108 (90 Base) MCG/ACT inhaler Inhale 2-4 puffs by mouth Every 4-6 hours as needed for wheezing, cough, and/or shortness of breath   aspirin EC 81 MG tablet Take 81 mg by mouth daily at 3 pm.    atorvastatin (LIPITOR) 40 MG tablet Take 1 tablet (40 mg total) by mouth daily.   Budeson-Glycopyrrol-Formoterol (BREZTRI AEROSPHERE) 160-9-4.8 MCG/ACT AERO Inhale 2 puffs into the lungs 2 (two) times daily.   buPROPion (WELLBUTRIN XL) 150 MG 24 hr tablet Take 1 tablet (150 mg total) by mouth daily. Dose change   colestipol (COLESTID) 1 g tablet Take 2 tablets (2 g total) by mouth daily for 30 doses.   Continuous Blood Gluc Receiver (FREESTYLE LIBRE 2 READER) DEVI Use as directed to read blood sugar 4 times a day  Dx E11.65   Continuous Blood Gluc Sensor (FREESTYLE LIBRE 14 DAY SENSOR) MISC 1 each by Does not apply route every 14 (fourteen) days. Apply to skin every 14 days E11.65   dapagliflozin propanediol (FARXIGA) 10 MG TABS tablet Take 1 tablet (10 mg total) by mouth daily before breakfast.   DULoxetine (CYMBALTA) 60 MG capsule Take 1 capsule (60 mg total) by mouth daily.   ezetimibe (ZETIA) 10 MG tablet Take 1 tablet (10 mg total) by mouth daily.   famotidine (PEPCID) 20 MG tablet Take 1 tablet (20 mg total) by mouth 2 (two) times daily.   glucose blood (ACCU-CHEK AVIVA PLUS)  test strip Blood sugar testing TID and as needed. E11.65   hydrOXYzine (ATARAX/VISTARIL) 10 MG tablet Take 1-2 tablets (10-20 mg total) by mouth at bedtime as needed. FOR SLEEP   insulin glargine, 2 Unit Dial, (TOUJEO MAX SOLOSTAR) 300 UNIT/ML Solostar Pen Inject 50 Units into the skin daily. May decrease to 40 units at bedtime if having episodes of hypoglycemia.   Insulin Pen Needle 32G X 4 MM MISC Use with daily dose insulin and with sliding scale insulin as needed.   MELATONIN PO Take 10 mg by mouth daily.   metoprolol tartrate (LOPRESSOR) 100 MG tablet Take 1 tablet (100 mg total) by mouth 2 (two) times daily.   mupirocin ointment (BACTROBAN) 2 % Apply 1 application topically 2 (two) times daily. Apply to affected area For 7 days   nitroGLYCERIN (NITROSTAT) 0.4 MG SL tablet Place 1 tablet (0.4 mg total) under the tongue every 5 (five) minutes x 3 doses as needed for chest pain.   pantoprazole (PROTONIX) 40 MG tablet Take 1 tablet (40 mg total) by mouth daily.   polyethylene glycol (MIRALAX) 17 g packet Take 17 g by mouth daily.   temazepam (RESTORIL) 30 MG capsule Take 1 capsule by mouth at bedtime   TRULICITY 1.5 0000000 SOPN INJECT ONE SYRINGEFUL INTO THE SKIN ONCE WEEKLY   No facility-administered encounter medications on file as of 12/02/2020.   Recent Relevant Labs: Lab Results  Component Value Date/Time   HGBA1C  8.7 (A) 10/18/2020 01:48 PM   HGBA1C 10.7 (A) 07/12/2020 09:03 AM   HGBA1C 11.9 (H) 05/31/2017 04:49 AM   HGBA1C 8.5 (H) 07/15/2015 06:32 PM   HGBA1C 6.2 12/09/2012 05:30 AM    Kidney Function Lab Results  Component Value Date/Time   CREATININE 1.06 08/17/2020 10:03 AM   CREATININE 1.05 03/09/2020 12:48 PM   CREATININE 1.25 11/28/2013 03:06 PM   CREATININE 1.05 07/31/2013 07:50 PM   GFRNONAA >60 03/09/2020 12:48 PM   GFRNONAA >60 11/28/2013 03:06 PM   GFRAA >60 01/25/2019 06:23 AM   GFRAA >60 11/28/2013 03:06 PM    Current antihyperglycemic regimen:  Wilder Glade  '10mg'$  daily Toujeo 50 units into the skin hs Trulicity 1.'5mg'$  once weekly  What recent interventions/DTPs have been made to improve glycemic control:  None.  Have there been any recent hospitalizations or ED visits since last visit with [facility]? Patient stated no.   Patient denies hypoglycemic symptoms, including None  Patient reports hyperglycemic symptoms, including none  How often are you checking your blood sugar?  Patient stated at least once daily  What are your blood sugars ranging?  Patient stated his readings are around 120-130 when fasting  200 12/01/20 After eating  197-12/02/20 After eating   During the week, how often does your blood glucose drop below 70?  Patient stated Never  Are you checking your feet daily/regularly?  Patient stated he checks his feet regularly.   Adherence Review: Is the patient currently on a STATIN medication?  Atorvastatin 40 mg   Is the patient currently on ACE/ARB medication? N/A.  Does the patient have >5 day gap between last estimated fill dates? Per misc rpts, no.  Star Rating Drugs: Dapagliflozin 10 mg 30 DS 10/16/20, Atorvastatin 40 mg 90 DS 09/21/20.  Follow-Up:Pharmacist Review  Charlann Lange, RMA Clinical Pharmacist Assistant [phone]  10 minutes spent in review, coordination, and documentation.  Reviewed by: Beverly Milch, PharmD Clinical Pharmacist [phone]

## 2020-12-07 ENCOUNTER — Other Ambulatory Visit: Payer: Self-pay | Admitting: Nurse Practitioner

## 2020-12-07 DIAGNOSIS — E1165 Type 2 diabetes mellitus with hyperglycemia: Secondary | ICD-10-CM

## 2020-12-13 ENCOUNTER — Telehealth (INDEPENDENT_AMBULATORY_CARE_PROVIDER_SITE_OTHER): Payer: Medicare Other | Admitting: Psychiatry

## 2020-12-13 ENCOUNTER — Other Ambulatory Visit: Payer: Self-pay

## 2020-12-13 ENCOUNTER — Encounter: Payer: Self-pay | Admitting: Psychiatry

## 2020-12-13 DIAGNOSIS — F17200 Nicotine dependence, unspecified, uncomplicated: Secondary | ICD-10-CM | POA: Diagnosis not present

## 2020-12-13 DIAGNOSIS — F4310 Post-traumatic stress disorder, unspecified: Secondary | ICD-10-CM

## 2020-12-13 DIAGNOSIS — G4701 Insomnia due to medical condition: Secondary | ICD-10-CM | POA: Diagnosis not present

## 2020-12-13 DIAGNOSIS — Z9189 Other specified personal risk factors, not elsewhere classified: Secondary | ICD-10-CM | POA: Insufficient documentation

## 2020-12-13 DIAGNOSIS — G4710 Hypersomnia, unspecified: Secondary | ICD-10-CM

## 2020-12-13 DIAGNOSIS — F3342 Major depressive disorder, recurrent, in full remission: Secondary | ICD-10-CM

## 2020-12-13 DIAGNOSIS — G4730 Sleep apnea, unspecified: Secondary | ICD-10-CM

## 2020-12-13 MED ORDER — BUPROPION HCL ER (XL) 300 MG PO TB24: 300.0000 mg | ORAL_TABLET | Freq: Every day | ORAL | 0 refills | Status: DC

## 2020-12-13 NOTE — Progress Notes (Signed)
Virtual Visit via Video Note  I connected with Raymond [name] on 12/13/20 at  2:00 PM EDT by a video enabled telemedicine application and verified that I am speaking with the correct person using two identifiers.  Location Provider Location : [facility] Patient Location : Home  Participants: Patient , Provider   I discussed the limitations of evaluation and management by telemedicine and the availability of in person appointments. The patient expressed understanding and agreed to proceed.   I discussed the assessment and treatment plan with the patient. The patient was provided an opportunity to ask questions and all were answered. The patient agreed with the plan and demonstrated an understanding of the instructions.   The patient was advised to call back or seek an in-person evaluation if the symptoms worsen or if the condition fails to improve as anticipated.   Tom Green MD OP Progress Note  12/14/2020 5:33 PM Raymond [name]  MRN:  [id number]  Chief Complaint:  Chief Complaint   Follow-up; Anxiety; Insomnia    HPI: Raymond [name] is a 55 year old male, divorced, has a history of MDD, PTSD, tobacco use disorder, insomnia, neuropathy, uncontrolled diabetes, hyperlipidemia, hepatic steatosis, lives in [place] was evaluated by telemedicine today.  Patient today reports he continues to struggle with sleep.  He reports he is able to sleep for a few hours however wakes up and has to take the hydroxyzine.  It takes an hour for him to fall back asleep and he gets a total of around 6 hours daily.  He reports he has been feeling extremely tired and fatigued during the day.  He is currently on a lower dosage of Wellbutrin however he does not believe that has anything to do with it.  He may have felt this way even prior.  He does use CPAP however his sleep study was completed several years ago, he does not clearly remember.  Patient reports he currently feels like he needs to take a nap during the  day and this has been getting worse.  Previously even though he felt tired he did not feel the need to take a nap and this is new for him.  Patient denies any suicidality, homicidality.  Patient denies any perceptual disturbances.  Patient with multiple medical problems including diabetes, neuropathy, currently on multiple medications, follows up with his providers.  Patient denies any other concerns today.  Visit Diagnosis:    ICD-10-CM   1. MDD (major depressive disorder), recurrent, in full remission ([place])  F33.42     2. PTSD (post-traumatic stress disorder)  F43.10     3. Tobacco use disorder  F17.200 buPROPion (WELLBUTRIN XL) 300 MG 24 hr tablet    4. Insomnia due to medical condition  G47.01 buPROPion (WELLBUTRIN XL) 300 MG 24 hr tablet   osa    5. Hypersomnia with sleep apnea  G47.10    G47.30     6. At risk for prolonged QT interval syndrome  Z91.89 EKG 12-Lead      Past Psychiatric History: Reviewed past psychiatric history from progress note on 05/23/2018.  Past trials of Prozac, Zoloft, Celexa, Tegretol, doxepin, Klonopin, Seroquel, mirtazapine, Belsomra, Lunesta, Ativan, Rozerem, Lexapro  Past Medical History:  Past Medical History:  Diagnosis Date   Anxiety    Arthritis    NECK   Bronchitis    Coronary artery disease    Depression    Diabetes mellitus without complication ([place])    Diastasis of rectus abdominis 06/19/2018   Dyspnea  DUE TO GALLBLADDER PER PT   Emphysema lung ([place])    Family history of adverse reaction to anesthesia    N/V, TROUBLE BREATHING COMING OUT OF ANESTHESIA   Fatty liver    GERD (gastroesophageal reflux disease)    Headache    MIGRAINES   Hyperlipidemia    Hypertension    Irregular heart beat unk   Myocardial infarction ([place]) 2010   MILD    PTSD (post-traumatic stress disorder)    Sleep apnea    USES CPAP   Tachycardia     Past Surgical History:  Procedure Laterality Date    ingrown toenail removal      CHOLECYSTECTOMY N/A 01/23/2019   Procedure: LAPAROSCOPIC CHOLECYSTECTOMY;  Surgeon: Olean Ree, MD;  Location: [facility] ORS;  Service: General;  Laterality: N/A;   CLAVICLE SURGERY Right    COLONOSCOPY  2017   COLONOSCOPY WITH PROPOFOL N/A 08/25/2020   Procedure: COLONOSCOPY WITH PROPOFOL;  Surgeon: Virgel Manifold, MD;  Location: [facility] ENDOSCOPY;  Service: Endoscopy;  Laterality: N/A;   ESOPHAGOGASTRODUODENOSCOPY (EGD) WITH PROPOFOL N/A 08/25/2020   Procedure: ESOPHAGOGASTRODUODENOSCOPY (EGD) WITH PROPOFOL;  Surgeon: Virgel Manifold, MD;  Location: [facility] ENDOSCOPY;  Service: Endoscopy;  Laterality: N/A;   MOUTH SURGERY     REMOVED ALL TEETH   SHOULDER SURGERY Right    UPPER GI ENDOSCOPY  2017    Family Psychiatric History: Reviewed family psychiatric history from progress note on 05/23/2018  Family History:  Family History  Problem Relation Age of Onset   Hypertension Mother    Diabetes type II Mother    Diabetes Mother    COPD Father    Cancer Father    Heart disease Father    Diabetes Sister    Anxiety disorder Sister    Depression Sister    Diabetes Brother    Anxiety disorder Brother    Depression Brother    Diabetes Maternal Aunt    Diabetes Sister    Anxiety disorder Sister    Depression Sister    Diabetes Sister    Anxiety disorder Sister    Depression Sister    Diabetes Sister    Anxiety disorder Sister    Depression Sister    Colon cancer Maternal Uncle     Social History: Reviewed social history from progress note on 05/23/2018 Social History   Socioeconomic History   Marital status: Divorced    Spouse name: Not on file   Number of children: 4   Years of education: Not on file   Highest education level: 8th grade  Occupational History   Not on file  Tobacco Use   Smoking status: Every Day    Packs/day: 1.00    Years: 40.00    Pack years: 40.00    Types: Cigarettes   Smokeless tobacco: Former    Types: Chew   Tobacco comments:    1 pack per  day reported 04/07/2020  Vaping Use   Vaping Use: Former  Substance and Sexual Activity   Alcohol use: No    Alcohol/week: 0.0 standard drinks   Drug use: No   Sexual activity: Not Currently  Other Topics Concern   Not on file  Social History Narrative   Not on file   Social Determinants of Health   Financial Resource Strain: Low Risk    Difficulty of Paying Living Expenses: Not very hard  Food Insecurity: Not on file  Transportation Needs: Not on file  Physical Activity: Not on file  Stress: Not on  file  Social Connections: Not on file    Allergies:  Allergies  Allergen Reactions   Onion Anaphylaxis   Hydrocodone    Norco [Hydrocodone-Acetaminophen] Itching   Hydrocodone-Acetaminophen Itching   Nickel Rash    Metabolic Disorder Labs: Lab Results  Component Value Date   HGBA1C 8.7 (A) 10/18/2020   MPG 294.83 05/31/2017   No results found for: PROLACTIN Lab Results  Component Value Date   CHOL 280 (H) 08/17/2020   TRIG 501 (H) 08/17/2020   HDL 31 (L) 08/17/2020   CHOLHDL 4.8 07/15/2015   VLDL 26 12/09/2012   LDLCALC 153 (H) 08/17/2020   LDLCALC 61 07/15/2015   Lab Results  Component Value Date   TSH 1.890 08/17/2020   TSH 4.120 06/10/2018    Therapeutic Level Labs: No results found for: LITHIUM No results found for: VALPROATE No components found for:  CBMZ  Current Medications: Current Outpatient Medications  Medication Sig Dispense Refill   buPROPion (WELLBUTRIN XL) 300 MG 24 hr tablet Take 1 tablet (300 mg total) by mouth daily. 90 tablet 0   ACCU-CHEK FASTCLIX LANCETS MISC yse as directed twice a day 100 each 1   albuterol (PROAIR HFA) 108 (90 Base) MCG/ACT inhaler Inhale 2-4 puffs by mouth Every 4-6 hours as needed for wheezing, cough, and/or shortness of breath 3 Inhaler 1   aspirin EC 81 MG tablet Take 81 mg by mouth daily at 3 pm.      atorvastatin (LIPITOR) 40 MG tablet Take 1 tablet (40 mg total) by mouth daily. 90 tablet 3    Budeson-Glycopyrrol-Formoterol (BREZTRI AEROSPHERE) 160-9-4.8 MCG/ACT AERO Inhale 2 puffs into the lungs 2 (two) times daily. 10.7 g 11   colestipol (COLESTID) 1 g tablet Take 2 tablets (2 g total) by mouth daily for 30 doses. 60 tablet 2   Continuous Blood Gluc Receiver (FREESTYLE LIBRE 2 READER) DEVI Use as directed to read blood sugar 4 times a day  Dx E11.65 1 each 5   Continuous Blood Gluc Sensor (FREESTYLE LIBRE 14 DAY SENSOR) MISC 1 each by Does not apply route every 14 (fourteen) days. Apply to skin every 14 days E11.65 6 each 12   DULoxetine (CYMBALTA) 60 MG capsule Take 1 capsule (60 mg total) by mouth daily. 90 capsule 0   ezetimibe (ZETIA) 10 MG tablet Take 1 tablet (10 mg total) by mouth daily. 30 tablet 2   famotidine (PEPCID) 20 MG tablet Take 1 tablet (20 mg total) by mouth 2 (two) times daily. 180 tablet 1   FARXIGA 10 MG TABS tablet TAKE 1 TABLET BY MOUTH ONCE DAILY BEFORE BREAKFAST 90 tablet 0   glucose blood (ACCU-CHEK AVIVA PLUS) test strip Blood sugar testing TID and as needed. E11.65 100 each 12   hydrOXYzine (ATARAX/VISTARIL) 10 MG tablet Take 1-2 tablets (10-20 mg total) by mouth at bedtime as needed. FOR SLEEP 30 tablet 2   insulin glargine, 2 Unit Dial, (TOUJEO MAX SOLOSTAR) 300 UNIT/ML Solostar Pen Inject 50 Units into the skin daily. May decrease to 40 units at bedtime if having episodes of hypoglycemia. 10 mL 3   Insulin Pen Needle 32G X 4 MM MISC Use with daily dose insulin and with sliding scale insulin as needed. 100 each 5   MELATONIN PO Take 10 mg by mouth daily.     metoprolol tartrate (LOPRESSOR) 100 MG tablet Take 1 tablet (100 mg total) by mouth 2 (two) times daily. 180 tablet 1   mupirocin ointment (BACTROBAN) 2 % Apply  1 application topically 2 (two) times daily. Apply to affected area For 7 days 22 g 0   nitroGLYCERIN (NITROSTAT) 0.4 MG SL tablet Place 1 tablet (0.4 mg total) under the tongue every 5 (five) minutes x 3 doses as needed for chest pain. 30 tablet  1   pantoprazole (PROTONIX) 40 MG tablet Take 1 tablet (40 mg total) by mouth daily. 90 tablet 1   polyethylene glycol (MIRALAX) 17 g packet Take 17 g by mouth daily. 30 each 0   temazepam (RESTORIL) 30 MG capsule Take 1 capsule by mouth at bedtime 30 capsule 1   TRULICITY 1.5 0000000 SOPN INJECT ONE SYRINGEFUL INTO THE SKIN ONCE WEEKLY 4 mL 0   No current facility-administered medications for this visit.     Musculoskeletal: Strength & Muscle Tone:  UTA Gait & Station: normal Patient leans: N/A  Psychiatric Specialty Exam: Review of Systems  Constitutional:  Positive for fatigue.  Psychiatric/Behavioral:  Positive for sleep disturbance.   All other systems reviewed and are negative.  There were no vitals taken for this visit.There is no height or weight on file to calculate BMI.  General Appearance: Casual  Eye Contact:  Good  Speech:  Normal Rate  Volume:  Normal  Mood:  Euthymic  Affect:  Congruent  Thought Process:  Goal Directed and Descriptions of Associations: Intact  Orientation:  Full (Time, Place, and Person)  Thought Content: Logical   Suicidal Thoughts:  No  Homicidal Thoughts:  No  Memory:  Immediate;   Fair Recent;   Fair Remote;   Fair  Judgement:  Fair  Insight:  Fair  Psychomotor Activity:  Normal  Concentration:  Concentration: Fair and Attention Span: Fair  Recall:  [employer] of Knowledge: Good  Language: Fair  Akathisia:  No  Handed:  Right  AIMS (if indicated): not done  Assets:  Communication Skills Desire for Improvement Social Support Talents/Skills Transportation Vocational/Educational  ADL's:  Intact  Cognition: WNL  Sleep:   Restless   Screenings: GAD-7    Flowsheet Row Office Visit from 11/22/2020 in [place]  Total GAD-7 Score 11      Raymond [name] from 10/18/2020 in [facility], [place] from 10/17/2019 in [facility], [place] from 10/08/2018 in [facility], [place] from 10/05/2017 in [facility], [facility]  Total Score (max 30 points ) '29 30 27 30      '$ PHQ2-9    [place] Visit from 11/22/2020 in [place] from 10/18/2020 in [facility], [place] Visit from 09/21/2020 in [facility], [place] from 08/31/2020 in [facility], [place] from 07/12/2020 in [facility], [facility]  PHQ-2 Total Score 4 0 0 0 0  PHQ-9 Total Score 15 -- -- -- --      [place] Visit from 11/22/2020 in [place] Admission (Discharged) from 08/25/2020 in [place] Video Visit from 06/23/2020 in [place] No Risk No Risk Low Risk        Assessment and Plan: Raymond [name] is a 55 year old Caucasian male, lives in [place], divorced, disabled, has a history of PTSD, MDD, insomnia, diabetes melitis, history of vision loss, neuropathy was evaluated by telemedicine today.  Patient with fatigue, sleep problems will continue to benefit from medication changes, sleep  study referral.  Plan as noted below.  Plan MDD in remission Cymbalta 60 mg p.o. daily Wellbutrin as prescribed  Insomnia-unstable Increase Wellbutrin XL to 300 mg p.o. daily with breakfast to address his fatigue and excessive daytime sleepiness Continue CPAP. Will refer for repeat sleep study/CPAP titration. Continue temazepam 30 mg p.o. nightly Hydroxyzine 10 to 20 mg p.o. nightly as needed  Hypersomnia with sleep apnea-unstable Patient is compliant with CPAP. We will refer for CPAP titration, sleep study.  PTSD-stable Cymbalta 60 mg p.o. daily  Tobacco use disorder-unstable Provided counseling for 2 minutes.  At risk for prolonged QT syndrome-we will order EKG.  Patient to  contact [phone] for an appointment.   Patient will continue to need sufficient pain management.  Follow-up in clinic in 3 weeks or sooner if needed.  This note was generated in part or whole with voice recognition software. Voice recognition is usually quite accurate but there are transcription errors that can and very often do occur. I apologize for any typographical errors that were not detected and corrected.       Ursula Alert, MD 12/14/2020, 5:33 PM

## 2020-12-18 ENCOUNTER — Other Ambulatory Visit: Payer: Self-pay | Admitting: Nurse Practitioner

## 2020-12-18 DIAGNOSIS — E1165 Type 2 diabetes mellitus with hyperglycemia: Secondary | ICD-10-CM

## 2020-12-22 ENCOUNTER — Telehealth: Payer: Medicare Other

## 2020-12-22 DIAGNOSIS — I10 Essential (primary) hypertension: Secondary | ICD-10-CM | POA: Diagnosis not present

## 2020-12-22 DIAGNOSIS — K219 Gastro-esophageal reflux disease without esophagitis: Secondary | ICD-10-CM | POA: Diagnosis not present

## 2020-12-22 NOTE — Telephone Encounter (Signed)
Appointment - Patient note from appointment with Dr. Shea Evans on 12/13/20 and referral information for sleep study follow up faxed to [facility] per their request to complete referral from that date.

## 2020-12-28 ENCOUNTER — Other Ambulatory Visit
Admission: RE | Admit: 2020-12-28 | Discharge: 2020-12-28 | Disposition: A | Discharge status: Home / Self Care | Payer: Medicare Other | Source: Ambulatory Visit | Attending: Internal Medicine | Admitting: Internal Medicine

## 2020-12-28 ENCOUNTER — Other Ambulatory Visit: Payer: Self-pay

## 2020-12-28 DIAGNOSIS — Z20822 Contact with and (suspected) exposure to covid-19: Secondary | ICD-10-CM | POA: Insufficient documentation

## 2020-12-28 DIAGNOSIS — Z01812 Encounter for preprocedural laboratory examination: Secondary | ICD-10-CM | POA: Insufficient documentation

## 2020-12-28 LAB — SARS CORONAVIRUS 2 (TAT 6-24 HRS): SARS Coronavirus 2: NEGATIVE

## 2020-12-31 ENCOUNTER — Ambulatory Visit
Admission: RE | Admit: 2020-12-31 | Discharge: 2020-12-31 | Disposition: A | Discharge status: Home / Self Care | Payer: Medicare Other | Source: Ambulatory Visit | Attending: Nurse Practitioner | Admitting: Nurse Practitioner

## 2020-12-31 ENCOUNTER — Other Ambulatory Visit: Payer: Self-pay

## 2020-12-31 DIAGNOSIS — Z01810 Encounter for preprocedural cardiovascular examination: Secondary | ICD-10-CM | POA: Diagnosis not present

## 2020-12-31 DIAGNOSIS — Z9189 Other specified personal risk factors, not elsewhere classified: Secondary | ICD-10-CM | POA: Diagnosis not present

## 2021-01-03 ENCOUNTER — Telehealth (INDEPENDENT_AMBULATORY_CARE_PROVIDER_SITE_OTHER): Payer: Medicare Other | Admitting: Psychiatry

## 2021-01-03 ENCOUNTER — Other Ambulatory Visit: Payer: Self-pay

## 2021-01-03 ENCOUNTER — Encounter: Payer: Self-pay | Admitting: Psychiatry

## 2021-01-03 DIAGNOSIS — F3342 Major depressive disorder, recurrent, in full remission: Secondary | ICD-10-CM | POA: Diagnosis not present

## 2021-01-03 DIAGNOSIS — Z9189 Other specified personal risk factors, not elsewhere classified: Secondary | ICD-10-CM

## 2021-01-03 DIAGNOSIS — F17200 Nicotine dependence, unspecified, uncomplicated: Secondary | ICD-10-CM

## 2021-01-03 DIAGNOSIS — F4310 Post-traumatic stress disorder, unspecified: Secondary | ICD-10-CM | POA: Diagnosis not present

## 2021-01-03 DIAGNOSIS — F5105 Insomnia due to other mental disorder: Secondary | ICD-10-CM

## 2021-01-03 DIAGNOSIS — G4730 Sleep apnea, unspecified: Secondary | ICD-10-CM

## 2021-01-03 DIAGNOSIS — G4710 Hypersomnia, unspecified: Secondary | ICD-10-CM

## 2021-01-03 MED ORDER — TEMAZEPAM 30 MG PO CAPS: 30.0000 mg | ORAL_CAPSULE | Freq: Every day | ORAL | 1 refills | Status: DC

## 2021-01-03 NOTE — Progress Notes (Signed)
Virtual Visit via Video Note  I connected with Raymond [name] on 01/03/21 at 11:20 AM EDT by a video enabled telemedicine application and verified that I am speaking with the correct person using two identifiers.  Location Provider Location : [facility] Patient Location : Home  Participants: Patient , Provider    I discussed the limitations of evaluation and management by telemedicine and the availability of in person appointments. The patient expressed understanding and agreed to proceed.   I discussed the assessment and treatment plan with the patient. The patient was provided an opportunity to ask questions and all were answered. The patient agreed with the plan and demonstrated an understanding of the instructions.   The patient was advised to call back or seek an in-person evaluation if the symptoms worsen or if the condition fails to improve as anticipated.    Raymond [name] OP Progress Note  01/03/2021 12:46 PM Raymond [name]  MRN:  [id number]  Chief Complaint:  Chief Complaint   Follow-up; Depression; Anxiety    HPI: Raymond [name] is a 55 year old male, divorced, has a history of MDD, PTSD, tobacco use disorder, insomnia, neuropathy, uncontrolled diabetes, hyperlipidemia, hepatic steatosis, lives in [place] was evaluated by telemedicine today.  Patient today reports he continues to struggle with sleep.  Patient reports sleep is restless at night.  He wakes up feeling groggy, tired.  This has been going on since the past several months, getting worse.  Patient was referred for sleep study, has upcoming sleep study scheduled for 01/05/2021.  He does use a CPAP, however in spite of that he struggles.  Patient continues to smoke cigarettes.  He reports he has been trying to cut back.  He has tried nicotine gums, lozenges.  He is allergic to the nicotine patches.  Patient reports he continues to struggle when he tries to cut back however is motivated to do so.  Patient denies any  suicidality, homicidality or perceptual disturbances.  Patient denies any other concerns today.  Visit Diagnosis:    ICD-10-CM   1. MDD (major depressive disorder), recurrent, in full remission ([place])  F33.42     2. PTSD (post-traumatic stress disorder)  F43.10     3. Tobacco use disorder  F17.200     4. Insomnia due to mental disorder  F51.05 temazepam (RESTORIL) 30 MG capsule    5. Hypersomnia with sleep apnea  G47.10    G47.30     6. At risk for prolonged QT interval syndrome  Z91.89       Past Psychiatric History: Reviewed past psychiatric history from progress note on 05/23/2018.  Past trials of Prozac, Zoloft, Celexa, Tegretol, doxepin, Klonopin, Seroquel, mirtazapine, Belsomra, Lunesta, Ativan, Rozerem, Lexapro  Past Medical History:  Past Medical History:  Diagnosis Date   Anxiety    Arthritis    NECK   Bronchitis    Coronary artery disease    Depression    Diabetes mellitus without complication ([place])    Diastasis of rectus abdominis 06/19/2018   Dyspnea    DUE TO GALLBLADDER PER PT   Emphysema lung (HCC)    Family history of adverse reaction to anesthesia    N/V, TROUBLE BREATHING COMING OUT OF ANESTHESIA   Fatty liver    GERD (gastroesophageal reflux disease)    Headache    MIGRAINES   Hyperlipidemia    Hypertension    Irregular heart beat unk   Myocardial infarction ([place]) 2010   MILD    PTSD (post-traumatic stress  disorder)    Sleep apnea    USES CPAP   Tachycardia     Past Surgical History:  Procedure Laterality Date    ingrown toenail removal     CHOLECYSTECTOMY N/A 01/23/2019   Procedure: LAPAROSCOPIC CHOLECYSTECTOMY;  Surgeon: Olean Ree, [name];  Location: [facility] OR[facility];  Service: General;  Laterality: N/A;   CLAVICLE SURGERY Right    COLONOSCOPY  2017   COLONOSCOPY WITH PROPOFOL N/A 08/25/2020   Procedure: COLONOSCOPY WITH PROPOFOL;  Surgeon: Virgel Manifold, [name];  Location: [facility] END[facility]COPY;  Service: Endoscopy;  Laterality: N/A;    ESOPHAGOGASTRODUODENOSCOPY (EGD) WITH PROPOFOL N/A 08/25/2020   Procedure: ESOPHAGOGASTRODUODENOSCOPY (EGD) WITH PROPOFOL;  Surgeon: Virgel Manifold, [name];  Location: [facility] ENDOSCOPY;  Service: Endoscopy;  Laterality: N/A;   MOUTH SURGERY     REMOVED ALL TEETH   SHOULDER SURGERY Right    UPPER GI ENDOSCOPY  2017    Family Psychiatric History: Reviewed family psychiatric history from progress note on 05/23/2018  Family History:  Family History  Problem Relation Age of Onset   Hypertension Mother    Diabetes type II Mother    Diabetes Mother    COPD Father    Cancer Father    Heart disease Father    Diabetes Sister    Anxiety disorder Sister    Depression Sister    Diabetes Brother    Anxiety disorder Brother    Depression Brother    Diabetes Maternal Aunt    Diabetes Sister    Anxiety disorder Sister    Depression Sister    Diabetes Sister    Anxiety disorder Sister    Depression Sister    Diabetes Sister    Anxiety disorder Sister    Depression Sister    Colon cancer Maternal Uncle     Social History: Reviewed social history from progress note on 05/23/2018 Social History   Socioeconomic History   Marital status: Divorced    Spouse name: Not on file   Number of children: 4   Years of education: Not on file   Highest education level: 8th grade  Occupational History   Not on file  Tobacco Use   Smoking status: Every Day    Packs/day: 1.50    Years: 40.00    Pack years: 60.00    Types: Cigarettes   Smokeless tobacco: Former    Types: Chew   Tobacco comments:    1 pack per day reported 01/03/2021  Vaping Use   Vaping Use: Former  Substance and Sexual Activity   Alcohol use: No    Alcohol/week: 0.0 standard drinks   Drug use: No   Sexual activity: Not Currently  Other Topics Concern   Not on file  Social History Narrative   Not on file   Social Determinants of Health   Financial Resource Strain: Low Risk    Difficulty of Paying Living Expenses: Not  very hard  Food Insecurity: Not on file  Transportation Needs: Not on file  Physical Activity: Not on file  Stress: Not on file  Social Connections: Not on file    Allergies:  Allergies  Allergen Reactions   Onion Anaphylaxis   Hydrocodone    Nicoderm [Nicotine]    Norco [Hydrocodone-Acetaminophen] Itching   Hydrocodone-Acetaminophen Itching   Nickel Rash    Metabolic Disorder Labs: Lab Results  Component Value Date   HGBA1C 8.7 (A) 10/18/2020   MPG 294.83 05/31/2017   No results found for: PROLACTIN Lab Results  Component Value Date  CHOL 280 (H) 08/17/2020   TRIG 501 (H) 08/17/2020   HDL 31 (L) 08/17/2020   CHOLHDL 4.8 07/15/2015   VLDL 26 12/09/2012   LDLCALC 153 (H) 08/17/2020   LDLCALC 61 07/15/2015   Lab Results  Component Value Date   TSH 1.890 08/17/2020   TSH 4.120 06/10/2018    Therapeutic Level Labs: No results found for: LITHIUM No results found for: VALPROATE No components found for:  CBMZ  Current Medications: Current Outpatient Medications  Medication Sig Dispense Refill   ACCU-CHEK FASTCLIX LANCETS MISC yse as directed twice a day 100 each 1   albuterol (PROAIR HFA) 108 (90 Base) MCG/ACT inhaler Inhale 2-4 puffs by mouth Every 4-6 hours as needed for wheezing, cough, and/or shortness of breath 3 Inhaler 1   aspirin EC 81 MG tablet Take 81 mg by mouth daily at 3 pm.      atorvastatin (LIPITOR) 40 MG tablet Take 1 tablet (40 mg total) by mouth daily. 90 tablet 3   Budeson-Glycopyrrol-Formoterol (BREZTRI AEROSPHERE) 160-9-4.8 MCG/ACT AERO Inhale 2 puffs into the lungs 2 (two) times daily. 10.7 g 11   buPROPion (WELLBUTRIN XL) 300 MG 24 hr tablet Take 1 tablet (300 mg total) by mouth daily. 90 tablet 0   colestipol (COLESTID) 1 g tablet Take 2 tablets (2 g total) by mouth daily for 30 doses. 60 tablet 2   Continuous Blood Gluc Receiver (FREESTYLE LIBRE 2 READER) DEVI Use as directed to read blood sugar 4 times a day  Dx E11.65 1 each 5    Continuous Blood Gluc Sensor (FREESTYLE LIBRE 14 DAY SENSOR) MISC 1 each by Does not apply route every 14 (fourteen) days. Apply to skin every 14 days E11.65 6 each 12   DULoxetine (CYMBALTA) 60 MG capsule Take 1 capsule (60 mg total) by mouth daily. 90 capsule 0   ezetimibe (ZETIA) 10 MG tablet Take 1 tablet (10 mg total) by mouth daily. 30 tablet 2   famotidine (PEPCID) 20 MG tablet Take 1 tablet (20 mg total) by mouth 2 (two) times daily. 180 tablet 1   FARXIGA 10 MG TABS tablet TAKE 1 TABLET BY MOUTH ONCE DAILY BEFORE BREAKFAST 90 tablet 0   glucose blood (ACCU-CHEK AVIVA PLUS) test strip Blood sugar testing TID and as needed. E11.65 100 each 12   hydrOXYzine (ATARAX/VISTARIL) 10 MG tablet Take 1-2 tablets (10-20 mg total) by mouth at bedtime as needed. FOR SLEEP 30 tablet 2   insulin glargine, 2 Unit Dial, (TOUJEO MAX SOLOSTAR) 300 UNIT/ML Solostar Pen Inject 50 Units into the skin daily. May decrease to 40 units at bedtime if having episodes of hypoglycemia. 10 mL 3   Insulin Pen Needle 32G X 4 MM MISC Use with daily dose insulin and with sliding scale insulin as needed. 100 each 5   MELATONIN PO Take 10 mg by mouth daily.     metoprolol tartrate (LOPRESSOR) 100 MG tablet Take 1 tablet (100 mg total) by mouth 2 (two) times daily. 180 tablet 1   mupirocin ointment (BACTROBAN) 2 % Apply 1 application topically 2 (two) times daily. Apply to affected area For 7 days 22 g 0   nitroGLYCERIN (NITROSTAT) 0.4 MG SL tablet Place 1 tablet (0.4 mg total) under the tongue every 5 (five) minutes x 3 doses as needed for chest pain. 30 tablet 1   pantoprazole (PROTONIX) 40 MG tablet Take 1 tablet (40 mg total) by mouth daily. 90 tablet 1   polyethylene glycol (MIRALAX) 17 g  packet Take 17 g by mouth daily. 30 each 0   temazepam (RESTORIL) 30 MG capsule Take 1 capsule (30 mg total) by mouth at bedtime. 30 capsule 1   TRULICITY 1.5 0000000 SOPN INJECT 1 SYRINGEFUL (0.5 MLS) SUBCUTANEOUSLY ONCE A WEEK 4 mL 0    No current facility-administered medications for this visit.     Musculoskeletal: Strength & Muscle Tone:  UTA Gait & Station: normal Patient leans: N/A  Psychiatric Specialty Exam: Review of Systems  Constitutional:  Positive for fatigue.  Psychiatric/Behavioral:  Positive for sleep disturbance. The patient is nervous/anxious.   All other systems reviewed and are negative.  There were no vitals taken for this visit.There is no height or weight on file to calculate BMI.  General Appearance: Fairly Groomed  Eye Contact:  Fair  Speech:  Clear and Coherent  Volume:  Normal  Mood:  Anxious Coping well  Affect:  Congruent  Thought Process:  Goal Directed and Descriptions of Associations: Intact  Orientation:  Full (Time, Place, and Person)  Thought Content: Logical   Suicidal Thoughts:  No  Homicidal Thoughts:  No  Memory:  Immediate;   Fair Recent;   Fair Remote;   Fair  Judgement:  Fair  Insight:  Fair  Psychomotor Activity:  Normal  Concentration:  Concentration: Fair and Attention Span: Fair  Recall:  [employer] of Knowledge: Fair  Language: Fair  Akathisia:  No  Handed:  Right  AIMS (if indicated): not done  Assets:  Communication Skills Desire for Improvement Housing Transportation  ADL's:  Intact  Cognition: WNL  Sleep:  Poor   Screenings: GAD-7    Flowsheet Row Office Visit from 11/22/2020 in [place]  Total GAD-7 Score [place] from 10/18/2020 in [facility] [place] from 10/17/2019 in [facility], [place] from 10/08/2018 in [facility] [place] from 10/05/2017 in [facility]  Total Score (max 30 points ) '29 30 27 30      '$ PHQ2-9    [place] Visit from 11/22/2020 in [place] from 10/18/2020 in [facility], [place] Visit from 09/21/2020 in [facility], [place] from 08/31/2020 in [facility], [place] from 07/12/2020 in [facility]  PHQ-2 Total Score 4 0 0 0 0  PHQ-9 Total Score 15 -- -- -- --      [place] Visit from 11/22/2020 in [place] Admission (Discharged) from 08/25/2020 in [place] Video Visit from 06/23/2020 in [place] No Risk No Risk Low Risk        Assessment and Plan: Raymond [name] is a 55 year old Caucasian male, lives in [place], divorced, disabled, has a history of PTSD, MDD, insomnia, diabetes mellitus, history of vision loss, neuropathy was evaluated by telemedicine today.  Patient is currently struggling with sleep problems.  Discussed plan as noted below.  Plan MDD in remission Cymbalta 60 mg p.o. daily Wellbutrin XL 300 mg p.o. daily.  Insomnia-unstable Patient was referred for sleep study-scheduled for 01/05/2021. Continue CPAP.  Hypersomnia with sleep apnea-unstable Referred for CPAP titration, sleep study-01/05/2021  PTSD-stable Cymbalta 60 mg p.o. daily  Tobacco use disorder-unstable Provided counseling for 5 minutes.   Follow-up in clinic in 4 weeks or sooner if needed.  At risk for prolonged QT syndrome-reviewed and discussed EKG dated 12/31/2020-Sinus tachycardia-ventricular rate 102, QTC-448.  This note was generated in part or whole with voice recognition software. Voice recognition is usually quite accurate but there are transcription errors that can and very often do occur. I apologize for any typographical errors that were not detected and corrected.       Ursula Alert, [name] 01/03/2021, 12:46 PM

## 2021-01-04 ENCOUNTER — Other Ambulatory Visit
Admission: RE | Admit: 2021-01-04 | Discharge: 2021-01-04 | Disposition: A | Discharge status: Home / Self Care | Payer: Medicare Other | Source: Ambulatory Visit | Attending: Psychiatry | Admitting: Psychiatry

## 2021-01-04 DIAGNOSIS — Z20822 Contact with and (suspected) exposure to covid-19: Secondary | ICD-10-CM | POA: Diagnosis not present

## 2021-01-04 DIAGNOSIS — Z01812 Encounter for preprocedural laboratory examination: Secondary | ICD-10-CM | POA: Insufficient documentation

## 2021-01-04 LAB — SARS CORONAVIRUS 2 (TAT 6-24 HRS): SARS Coronavirus 2: NEGATIVE

## 2021-01-05 ENCOUNTER — Ambulatory Visit: Payer: Medicare Other | Attending: Neurology

## 2021-01-05 DIAGNOSIS — G4733 Obstructive sleep apnea (adult) (pediatric): Secondary | ICD-10-CM | POA: Insufficient documentation

## 2021-01-06 ENCOUNTER — Other Ambulatory Visit: Payer: Self-pay

## 2021-01-11 ENCOUNTER — Other Ambulatory Visit: Payer: Self-pay | Admitting: Nurse Practitioner

## 2021-01-11 DIAGNOSIS — I10 Essential (primary) hypertension: Secondary | ICD-10-CM

## 2021-01-11 DIAGNOSIS — F5105 Insomnia due to other mental disorder: Secondary | ICD-10-CM

## 2021-01-17 ENCOUNTER — Encounter: Payer: Self-pay | Admitting: Nurse Practitioner

## 2021-01-17 ENCOUNTER — Telehealth: Payer: Self-pay

## 2021-01-17 ENCOUNTER — Other Ambulatory Visit: Payer: Self-pay

## 2021-01-17 ENCOUNTER — Ambulatory Visit (INDEPENDENT_AMBULATORY_CARE_PROVIDER_SITE_OTHER): Payer: Medicare Other | Admitting: Nurse Practitioner

## 2021-01-17 VITALS — BP 129/89 | HR 112 | Temp 97.8°F | Resp 16 | Ht 69.0 in | Wt 230.0 lb

## 2021-01-17 DIAGNOSIS — I10 Essential (primary) hypertension: Secondary | ICD-10-CM

## 2021-01-17 DIAGNOSIS — R1011 Right upper quadrant pain: Secondary | ICD-10-CM | POA: Diagnosis not present

## 2021-01-17 DIAGNOSIS — E1165 Type 2 diabetes mellitus with hyperglycemia: Secondary | ICD-10-CM | POA: Diagnosis not present

## 2021-01-17 DIAGNOSIS — K219 Gastro-esophageal reflux disease without esophagitis: Secondary | ICD-10-CM | POA: Diagnosis not present

## 2021-01-17 DIAGNOSIS — Z9989 Dependence on other enabling machines and devices: Secondary | ICD-10-CM

## 2021-01-17 DIAGNOSIS — G4733 Obstructive sleep apnea (adult) (pediatric): Secondary | ICD-10-CM

## 2021-01-17 LAB — POCT GLYCOSYLATED HEMOGLOBIN (HGB A1C): Hemoglobin A1C: 8.9 % — AB (ref 4.0–5.6)

## 2021-01-17 MED ORDER — DAPAGLIFLOZIN PROPANEDIOL 10 MG PO TABS: 10.0000 mg | ORAL_TABLET | Freq: Every day | ORAL | 2 refills | Status: DC

## 2021-01-17 MED ORDER — METOPROLOL TARTRATE 100 MG PO TABS: 100.0000 mg | ORAL_TABLET | Freq: Two times a day (BID) | ORAL | 1 refills | Status: DC

## 2021-01-17 MED ORDER — PANTOPRAZOLE SODIUM 40 MG PO TBEC
40.0000 mg | DELAYED_RELEASE_TABLET | Freq: Every day | ORAL | 1 refills | Status: DC
Start: 2021-01-17 — End: 2021-07-12

## 2021-01-17 MED ORDER — DULAGLUTIDE 3 MG/0.5ML ~~LOC~~ SOAJ: 3.0000 mg | SUBCUTANEOUS | 2 refills | Status: DC

## 2021-01-17 NOTE — Progress Notes (Signed)
[facility] [place], [place] [address]  Internal MEDICINE  Office Visit Note  Patient Name: Raymond [name]  [id number]  [id number]  Date of Service: 01/17/2021  Chief Complaint  Patient presents with   Follow-up   Diabetes   Hypertension   Hyperlipidemia   Depression   Quality Metric Gaps    Eye exam and microalbumin    HPI Raymond [name] presents for a follow up visit for diabetes and hypertension. A1C was 8.7 at previous office visit in June 2022. His A1C is 8.9 today. He has also been having some RUQ abdominal pain and tenderness. Has had consistently elevated liver enzymes. -CPAP is not helping at current level. Has not seen Dr. Lanice Schwab or Claiborne Billings in a while.  -gained 1 lb since last office visit.  Echocardiogram was ordered in may 2022, still has not been done, needs to be scheduled.    Current Medication: Outpatient Encounter Medications as of 01/17/2021  Medication Sig   ACCU-CHEK FASTCLIX LANCETS MISC yse as directed twice a day   albuterol (PROAIR HFA) 108 (90 Base) MCG/ACT inhaler Inhale 2-4 puffs by mouth Every 4-6 hours as needed for wheezing, cough, and/or shortness of breath   aspirin EC 81 MG tablet Take 81 mg by mouth daily at 3 pm.    atorvastatin (LIPITOR) 40 MG tablet Take 1 tablet (40 mg total) by mouth daily.   Budeson-Glycopyrrol-Formoterol (BREZTRI AEROSPHERE) 160-9-4.8 MCG/ACT AERO Inhale 2 puffs into the lungs 2 (two) times daily.   buPROPion (WELLBUTRIN XL) 300 MG 24 hr tablet Take 1 tablet (300 mg total) by mouth daily.   Dulaglutide 3 MG/0.5ML SOPN Inject 3 mg into the skin once a week.   DULoxetine (CYMBALTA) 60 MG capsule Take 1 capsule (60 mg total) by mouth daily.   ezetimibe (ZETIA) 10 MG tablet Take 1 tablet (10 mg total) by mouth daily.   famotidine (PEPCID) 20 MG tablet Take 1 tablet (20 mg total) by mouth 2 (two) times daily.   glucose blood (ACCU-CHEK AVIVA PLUS) test strip Blood sugar testing TID and as needed. E11.65    hydrOXYzine (ATARAX/VISTARIL) 10 MG tablet TAKE 1 TO 2 TABLETS BY MOUTH AT BEDTIME AS NEEDED FOR SLEEP   insulin glargine, 2 Unit Dial, (TOUJEO MAX SOLOSTAR) 300 UNIT/ML Solostar Pen Inject 50 Units into the skin daily. May decrease to 40 units at bedtime if having episodes of hypoglycemia.   Insulin Pen Needle 32G X 4 MM MISC Use with daily dose insulin and with sliding scale insulin as needed.   MELATONIN PO Take 10 mg by mouth daily.   nitroGLYCERIN (NITROSTAT) 0.4 MG SL tablet Place 1 tablet (0.4 mg total) under the tongue every 5 (five) minutes x 3 doses as needed for chest pain.   polyethylene glycol (MIRALAX) 17 g packet Take 17 g by mouth daily.   temazepam (RESTORIL) 30 MG capsule Take 1 capsule (30 mg total) by mouth at bedtime.   [DISCONTINUED] Continuous Blood Gluc Receiver (FREESTYLE LIBRE 2 READER) DEVI Use as directed to read blood sugar 4 times a day  Dx E11.65   [DISCONTINUED] Continuous Blood Gluc Sensor (FREESTYLE LIBRE 14 DAY SENSOR) MISC 1 each by Does not apply route every 14 (fourteen) days. Apply to skin every 14 days E11.65   [DISCONTINUED] FARXIGA 10 MG TABS tablet TAKE 1 TABLET BY MOUTH ONCE DAILY BEFORE BREAKFAST   [DISCONTINUED] metoprolol tartrate (LOPRESSOR) 100 MG tablet Take 1 tablet (100 mg total) by mouth 2 (two) times  daily.   [DISCONTINUED] mupirocin ointment (BACTROBAN) 2 % Apply 1 application topically 2 (two) times daily. Apply to affected area For 7 days   [DISCONTINUED] pantoprazole (PROTONIX) 40 MG tablet Take 1 tablet (40 mg total) by mouth daily.   [DISCONTINUED] TRULICITY 1.5 HU/7.6LY SOPN INJECT 1 SYRINGEFUL (0.5 MLS) SUBCUTANEOUSLY ONCE A WEEK   colestipol (COLESTID) 1 g tablet Take 2 tablets (2 g total) by mouth daily for 30 doses.   dapagliflozin propanediol (FARXIGA) 10 MG TABS tablet Take 1 tablet (10 mg total) by mouth daily before breakfast.   metoprolol tartrate (LOPRESSOR) 100 MG tablet Take 1 tablet (100 mg total) by mouth 2 (two) times daily.    pantoprazole (PROTONIX) 40 MG tablet Take 1 tablet (40 mg total) by mouth daily.   No facility-administered encounter medications on file as of 01/17/2021.    Surgical History: Past Surgical History:  Procedure Laterality Date    ingrown toenail removal     CHOLECYSTECTOMY N/A 01/23/2019   Procedure: LAPAROSCOPIC CHOLECYSTECTOMY;  Surgeon: Olean Ree, MD;  Location: [facility] ORS;  Service: General;  Laterality: N/A;   CLAVICLE SURGERY Right    COLONOSCOPY  2017   COLONOSCOPY WITH PROPOFOL N/A 08/25/2020   Procedure: COLONOSCOPY WITH PROPOFOL;  Surgeon: Virgel Manifold, MD;  Location: [facility];  Service: Endoscopy;  Laterality: N/A;   ESOPHAGOGASTRODUODENOSCOPY (EGD) WITH PROPOFOL N/A 08/25/2020   Procedure: ESOPHAGOGASTRODUODENOSCOPY (EGD) WITH PROPOFOL;  Surgeon: Virgel Manifold, MD;  Location: [facility] END[facility];  Service: Endoscopy;  Laterality: N/A;   MOUTH SURGERY     REMOVED ALL TEETH   SHOULDER SURGERY Right    UPPER GI ENDOSCOPY  2017    Medical History: Past Medical History:  Diagnosis Date   Anxiety    Arthritis    NECK   Bronchitis    Coronary artery disease    Depression    Diabetes mellitus without complication ([place])    Diastasis of rectus abdominis 06/19/2018   Dyspnea    DUE TO GALLBLADDER PER PT   Emphysema lung (HCC)    Family history of adverse reaction to anesthesia    N/V, TROUBLE BREATHING COMING OUT OF ANESTHESIA   Fatty liver    GERD (gastroesophageal reflux disease)    Headache    MIGRAINES   Hyperlipidemia    Hypertension    Irregular heart beat unk   Myocardial infarction ([place]) 2010   MILD    PTSD (post-traumatic stress disorder)    Sleep apnea    USES CPAP   Tachycardia     Family History: Family History  Problem Relation Age of Onset   Hypertension Mother    Diabetes type II Mother    Diabetes Mother    COPD Father    Cancer Father    Heart disease Father    Diabetes Sister    Anxiety disorder Sister    Depression  Sister    Diabetes Brother    Anxiety disorder Brother    Depression Brother    Diabetes Maternal Aunt    Diabetes Sister    Anxiety disorder Sister    Depression Sister    Diabetes Sister    Anxiety disorder Sister    Depression Sister    Diabetes Sister    Anxiety disorder Sister    Depression Sister    Colon cancer Maternal Uncle     Social History   Socioeconomic History   Marital status: Divorced    Spouse name: Not on file   Number of  children: 4   Years of education: Not on file   Highest education level: 8th grade  Occupational History   Not on file  Tobacco Use   Smoking status: Every Day    Packs/day: 1.50    Years: 40.00    Pack years: 60.00    Types: Cigarettes   Smokeless tobacco: Former    Types: Chew   Tobacco comments:    1 pack per day reported 01/03/2021  Vaping Use   Vaping Use: Former  Substance and Sexual Activity   Alcohol use: No    Alcohol/week: 0.0 standard drinks   Drug use: No   Sexual activity: Not Currently  Other Topics Concern   Not on file  Social History Narrative   Not on file   Social Determinants of Health   Financial Resource Strain: Low Risk    Difficulty of Paying Living Expenses: Not very hard  Food Insecurity: Not on file  Transportation Needs: Not on file  Physical Activity: Not on file  Stress: Not on file  Social Connections: Not on file  Intimate Partner Violence: Not on file      Review of Systems  Constitutional:  Negative for chills, fatigue and unexpected weight change.  HENT:  Negative for congestion, rhinorrhea, sneezing and sore throat.   Eyes:  Negative for redness.  Respiratory:  Negative for cough, chest tightness and shortness of breath.   Cardiovascular:  Negative for chest pain and palpitations.  Gastrointestinal:  Positive for abdominal pain (RUQ pain). Negative for constipation, diarrhea, nausea and vomiting.  Genitourinary:  Negative for dysuria and frequency.  Musculoskeletal:  Negative  for arthralgias, back pain, joint swelling and neck pain.  Skin:  Negative for rash.  Neurological: Negative.  Negative for tremors and numbness.  Hematological:  Negative for adenopathy. Does not bruise/bleed easily.  Psychiatric/Behavioral:  Negative for behavioral problems (Depression), sleep disturbance and suicidal ideas. The patient is not nervous/anxious.    Vital Signs: BP 129/89   Pulse (!) 112   Temp 97.8 F (36.6 C)   Resp 16   Ht 5\' 9"  (1.753 m)   Wt 230 lb (104.3 kg)   SpO2 94%   BMI 33.97 kg/m    Physical Exam Vitals reviewed.  Constitutional:      General: He is not in acute distress.    Appearance: Normal appearance. He is obese. He is not ill-appearing.  HENT:     Head: Normocephalic and atraumatic.  Eyes:     Extraocular Movements: Extraocular movements intact.     Pupils: Pupils are equal, round, and reactive to light.  Pulmonary:     Effort: Pulmonary effort is normal. No respiratory distress.  Abdominal:     General: Bowel sounds are normal.     Palpations: Abdomen is soft. There is no shifting dullness, fluid wave, mass or pulsatile mass.     Tenderness: There is abdominal tenderness in the right upper quadrant.  Neurological:     Mental Status: He is alert and oriented to person, place, and time.     Cranial Nerves: No cranial nerve deficit.     Coordination: Coordination normal.     Gait: Gait normal.  Psychiatric:        Mood and Affect: Mood normal.        Behavior: Behavior normal.       Assessment/Plan: 1. Uncontrolled type 2 diabetes mellitus with hyperglycemia ([place]) Farxiga refilled, trulicity dose increased, follow up in 3 months to repeat A1C -  POCT HgB A1C - dapagliflozin propanediol (FARXIGA) 10 MG TABS tablet; Take 1 tablet (10 mg total) by mouth daily before breakfast.  Dispense: 90 tablet; Refill: 2 - Dulaglutide 3 MG/0.5ML SOPN; Inject 3 mg into the skin once a week.  Dispense: 6 mL; Refill: 2  2. RUQ pain Consistently  elevated liver enzymes and abdominal pain in RUQ. Ultrasound ordered.  - US Abdomen Limited RUQ (LIVER/GB); Future  3. OSA on CPAP Increase setting to 14 from 12. Need to follow up with kelly or Dr. Lanice Schwab.   4. Essential hypertension Refill ordered, BP is stable at this time.  - metoprolol tartrate (LOPRESSOR) 100 MG tablet; Take 1 tablet (100 mg total) by mouth 2 (two) times daily.  Dispense: 180 tablet; Refill: 1  5. Gastroesophageal reflux disease without esophagitis Stable with pantoprazole, refill ordered - pantoprazole (PROTONIX) 40 MG tablet; Take 1 tablet (40 mg total) by mouth daily.  Dispense: 90 tablet; Refill: 1    General Counseling: Alann verbalizes understanding of the findings of todays visit and agrees with plan of treatment. I have discussed any further diagnostic evaluation that may be needed or ordered today. We also reviewed his medications today. he has been encouraged to call the office with any questions or concerns that should arise related to todays visit.    Orders Placed This Encounter  Procedures   US Abdomen Limited RUQ (LIVER/GB)   POCT HgB A1C    Meds ordered this encounter  Medications   metoprolol tartrate (LOPRESSOR) 100 MG tablet    Sig: Take 1 tablet (100 mg total) by mouth 2 (two) times daily.    Dispense:  180 tablet    Refill:  1    Increased dose   dapagliflozin propanediol (FARXIGA) 10 MG TABS tablet    Sig: Take 1 tablet (10 mg total) by mouth daily before breakfast.    Dispense:  90 tablet    Refill:  2   pantoprazole (PROTONIX) 40 MG tablet    Sig: Take 1 tablet (40 mg total) by mouth daily.    Dispense:  90 tablet    Refill:  1   Dulaglutide 3 MG/0.5ML SOPN    Sig: Inject 3 mg into the skin once a week.    Dispense:  6 mL    Refill:  2    Return in about 3 weeks (around 02/07/2021) for F/U, U/S @ [place] and eval CPAP setting adjustment .   Total time spent:20 Minutes Time spent includes review of chart, medications,  test results, and follow up plan with the patient.   [place] Controlled Substance Database was reviewed by me.  This patient was seen by Jonetta Osgood, FNP-C in collaboration with Dr. Clayborn Bigness as a part of collaborative care agreement.   Daylin Eads R. Valetta Fuller, MSN, FNP-C Internal medicine

## 2021-01-17 NOTE — Telephone Encounter (Signed)
Left patient vm letting him know it is $115 copay to have u/s done here. Asked him to return call to see if he still wants u/s done here-Toni

## 2021-01-17 NOTE — Telephone Encounter (Signed)
Patient prefers to have ultrasound done @ [facility]. I sent order via Epic.

## 2021-01-18 NOTE — Progress Notes (Signed)
Chronic Care Management Pharmacy Note  01/21/2021 Name:  Raymond [name] MRN:  [id number] DOB:  08/12/1965  Subjective: Raymond [name] is an 55 y.o. year old male who is a primary patient of Jonetta Osgood, NP.  The CCM team was consulted for assistance with disease management and care coordination needs.    Engaged with patient by telephone for follow up visit in response to provider referral for pharmacy case management and/or care coordination services.   Consent to Services:  The patient was given the following information about Chronic Care Management services today, agreed to services, and gave verbal consent: 1. CCM service includes personalized support from designated clinical staff supervised by the primary care provider, including individualized plan of care and coordination with other care providers 2. 24/7 contact phone numbers for assistance for urgent and routine care needs. 3. Service will only be billed when office clinical staff spend 20 minutes or more in a month to coordinate care. 4. Only one practitioner may furnish and bill the service in a calendar month. 5.The patient may stop CCM services at any time (effective at the end of the month) by phone call to the office staff. 6. The patient will be responsible for cost sharing (co-pay) of up to 20% of the service fee (after annual deductible is met). Patient agreed to services and consent obtained.  Patient Care Team: Jonetta Osgood, NP as PCP - General (Nurse Practitioner) Edythe Clarity, [facility] as Pharmacist (Pharmacist) Lavera Guise, MD (Internal Medicine)  Recent office visits: 09/21/20 Valetta Fuller) - started on Zetia and increased dose of Lipitor to 83m due to elevated lipid panel.  [facility]  New DM medication regimen of Farxiga, Trulicity, and Toujeo.  Recent consult visits: None since last CCM call  Hospital visits: None in previous 6 months   Objective:  Lab Results   Component Value Date   CREATININE 1.06 08/17/2020   BUN 14 08/17/2020   GFRNONAA >60 03/09/2020   GFRAA >60 01/25/2019   NA 133 (L) 08/17/2020   K 5.1 08/17/2020   CALCIUM 10.2 08/17/2020   CO2 22 08/17/2020   GLUCOSE 400 (H) 08/17/2020    Lab Results  Component Value Date/Time   HGBA1C 8.9 (A) 01/17/2021 02:01 PM   HGBA1C 8.7 (A) 10/18/2020 01:48 PM   HGBA1C 11.9 (H) 05/31/2017 04:49 AM   HGBA1C 8.5 (H) 07/15/2015 06:32 PM   HGBA1C 6.2 12/09/2012 05:30 AM    Last diabetic Eye exam: No results found for: HMDIABEYEEXA  Last diabetic Foot exam: No results found for: HMDIABFOOTEX   Lab Results  Component Value Date   CHOL 280 (H) 08/17/2020   HDL 31 (L) 08/17/2020   LDLCALC 153 (H) 08/17/2020   TRIG 501 (H) 08/17/2020   CHOLHDL 4.8 07/15/2015    Hepatic Function Latest Ref Rng & Units 08/17/2020 03/09/2020 01/25/2019  Total Protein 6.0 - 8.5 g/dL 7.6 8.5(H) 8.1  Albumin 3.8 - 4.9 g/dL 3.9 4.1 3.8  AST 0 - 40 IU/L 33 28 52(H)  ALT 0 - 44 IU/L 52(H) 26 73(H)  Alk Phosphatase 44 - 121 IU/L 143(H) 109 104  Total Bilirubin 0.0 - 1.2 mg/dL 0.2 0.6 1.1  Bilirubin, Direct 0.0 - 0.2 mg/dL - <0.1 -    Lab Results  Component Value Date/Time   TSH 1.890 08/17/2020 10:03 AM   TSH 4.120 06/10/2018 11:36 AM   FREET4 1.08 08/17/2020 10:03 AM    CBC Latest Ref Rng & Units  08/17/2020 01/25/2019 01/20/2019  WBC 3.4 - 10.8 x10E3/uL 8.5 11.9(H) 11.4(H)  Hemoglobin 13.0 - 17.7 g/dL 15.6 14.7 15.1  Hematocrit 37.5 - 51.0 % 45.9 43.1 43.8  Platelets 150 - 450 x10E3/uL 213 189 214    No results found for: VD25OH  Clinical ASCVD: Yes  The 10-year ASCVD risk score (Arnett DK, et al., 2019) is: 44.7%   Values used to calculate the score:     Age: 71 years     Sex: Male     Is Non-Hispanic African American: No     Diabetic: Yes     Tobacco smoker: Yes     Systolic Blood Pressure: 364 mmHg     Is BP treated: Yes     HDL Cholesterol: 31 mg/dL     Total Cholesterol: 280 mg/dL     Depression screen [facility] 2/9 01/17/2021 11/22/2020 10/18/2020  Decreased Interest 0 3 0  Down, Depressed, Hopeless 0 1 0  PHQ - 2 Score 0 4 0  Altered sleeping - 3 -  Tired, decreased energy - 3 -  Change in appetite - 2 -  Feeling bad or failure about yourself  - 1 -  Trouble concentrating - 1 -  Moving slowly or fidgety/restless - 1 -  Suicidal thoughts - 0 -  PHQ-9 Score - 15 -  Difficult doing work/chores - Somewhat difficult -  Some recent data might be hidden      Social History   Tobacco Use  Smoking Status Every Day   Packs/day: 1.50   Years: 40.00   Pack years: 60.00   Types: Cigarettes  Smokeless Tobacco Former   Types: Chew  Tobacco Comments   1 pack per day reported 01/03/2021   BP Readings from Last 3 Encounters:  01/17/21 129/89  11/22/20 126/81  10/18/20 117/82   Pulse Readings from Last 3 Encounters:  01/17/21 (!) 112  11/22/20 94  10/18/20 98   Wt Readings from Last 3 Encounters:  01/17/21 230 lb (104.3 kg)  11/22/20 227 lb 3.2 oz (103.1 kg)  10/18/20 229 lb 3.2 oz (104 kg)   BMI Readings from Last 3 Encounters:  01/17/21 33.97 kg/m  11/22/20 33.55 kg/m  10/18/20 33.85 kg/m    Assessment/Interventions: Review of patient past medical history, allergies, medications, health status, including review of consultants reports, laboratory and other test data, was performed as part of comprehensive evaluation and provision of chronic care management services.   SDOH:  (Social Determinants of Health) assessments and interventions performed: Yes   Financial Resource Strain: Low Risk    Difficulty of Paying Living Expenses: Not very hard    SDOH Screenings   Alcohol Screen: Low Risk    Last Alcohol Screening Score (AUDIT): 0  Depression (PHQ2-9): Low Risk    PHQ-2 Score: 0  Financial Resource Strain: Low Risk    Difficulty of Paying Living Expenses: Not very hard  Food Insecurity: Not on file  Housing: Not on file  Physical Activity: Not on  file  Social Connections: Not on file  Stress: Not on file  Tobacco Use: High Risk   Smoking Tobacco Use: Every Day   Smokeless Tobacco Use: Former  Transport planner Needs: Not on file    CCM Care Plan  Allergies  Allergen Reactions   Onion Anaphylaxis   Hydrocodone    Nicoderm [Nicotine]    Norco [Hydrocodone-Acetaminophen] Itching   Hydrocodone-Acetaminophen Itching   Nickel Rash    Medications Reviewed Today     Reviewed by  Edythe Clarity, [facility] (Pharmacist) on 01/21/21 at 8  Med List Status: <None>   Medication Order Taking? Sig Documenting Provider Last Dose Status Informant  ACCU-CHEK FASTCLIX LANCETS MISC [id number] Yes yse as directed twice a day Ronnell Freshwater, NP Taking Active Self  albuterol (PROAIR HFA) 108 (90 Base) MCG/ACT inhaler [id number] Yes Inhale 2-4 puffs by mouth Every 4-6 hours as needed for wheezing, cough, and/or shortness of breath Ronnell Freshwater, NP Taking Active Self  aspirin EC 81 MG tablet [id number] Yes Take 81 mg by mouth daily at 3 pm.  [provider] Taking Active Self  atorvastatin (LIPITOR) 40 MG tablet [id number] Yes Take 1 tablet (40 mg total) by mouth daily. Jonetta Osgood, NP Taking Active   Budeson-Glycopyrrol-Formoterol (BREZTRI AEROSPHERE) 160-9-4.8 MCG/ACT AERO [id number] Yes Inhale 2 puffs into the lungs 2 (two) times daily. Jonetta Osgood, NP Taking Active   buPROPion (WELLBUTRIN XL) 300 MG 24 hr tablet [id number] Yes Take 1 tablet (300 mg total) by mouth daily. Ursula Alert, MD Taking Active   colestipol (COLESTID) 1 g tablet [id number]  Take 2 tablets (2 g total) by mouth daily for 30 doses. Virgel Manifold, MD  Expired 09/08/20 2359   dapagliflozin propanediol (FARXIGA) 10 MG TABS tablet [id number] Yes Take 1 tablet (10 mg total) by mouth daily before breakfast. Jonetta Osgood, NP Taking Active   Dulaglutide 3 MG/0.5ML SOPN [id number] Yes Inject 3 mg into the skin once a week. Jonetta Osgood, NP Taking  Active   DULoxetine (CYMBALTA) 60 MG capsule [id number] Yes Take 1 capsule (60 mg total) by mouth daily. Ursula Alert, MD Taking Active   ezetimibe (ZETIA) 10 MG tablet [id number] Yes Take 1 tablet (10 mg total) by mouth daily. Jonetta Osgood, NP Taking Active   famotidine (PEPCID) 20 MG tablet [id number] Yes Take 1 tablet (20 mg total) by mouth 2 (two) times daily. Jonetta Osgood, NP Taking Active   glucose blood (ACCU-CHEK AVIVA PLUS) test strip [id number] Yes Blood sugar testing TID and as needed. E11.65 Ronnell Freshwater, NP Taking Active Self  hydrOXYzine (ATARAX/VISTARIL) 10 MG tablet [id number] Yes TAKE 1 TO 2 TABLETS BY MOUTH AT BEDTIME AS NEEDED FOR SLEEP Abernathy, Alyssa, NP Taking Active   insulin glargine, 2 Unit Dial, (TOUJEO MAX SOLOSTAR) 300 UNIT/ML Solostar Pen [id number] Yes Inject 50 Units into the skin daily. May decrease to 40 units at bedtime if having episodes of hypoglycemia. Jonetta Osgood, NP Taking Active   Insulin Pen Needle 32G X 4 MM MISC [id number] Yes Use with daily dose insulin and with sliding scale insulin as needed. Ronnell Freshwater, NP Taking Active Self  MELATONIN PO [id number] Yes Take 10 mg by mouth daily. [provider] Taking Active   metoprolol tartrate (LOPRESSOR) 100 MG tablet [id number] Yes Take 1 tablet (100 mg total) by mouth 2 (two) times daily. Jonetta Osgood, NP Taking Active   nitroGLYCERIN (NITROSTAT) 0.4 MG SL tablet [id number] Yes Place 1 tablet (0.4 mg total) under the tongue every 5 (five) minutes x 3 doses as needed for chest pain. Ronnell Freshwater, NP Taking Active Self  pantoprazole (PROTONIX) 40 MG tablet [id number] Yes Take 1 tablet (40 mg total) by mouth daily. Jonetta Osgood, NP Taking Active   polyethylene glycol (MIRALAX) 17 g packet [id number] Yes Take 17 g by mouth daily. Jonetta Osgood, NP Taking Active   temazepam (RESTORIL) 30 MG capsule [id number] Yes Take 1 capsule (30 mg total) by mouth at bedtime. Ursula Alert, MD Taking  Active             Patient Active Problem List   Diagnosis Date Noted   Insomnia due to medical condition 12/13/2020   At risk for prolonged QT interval syndrome 12/13/2020   Hypersomnia with sleep apnea 12/13/2020   Mixed hyperlipidemia 09/26/2020   Pulmonary emphysema ([place]) 09/26/2020   Barrett's esophagus without dysplasia    Gastric erythema    Special screening for malignant neoplasms, colon    Polyp of colon    MDD (major depressive disorder), recurrent episode, mild ([place]) 06/23/2020   Noncompliance with diabetes treatment 04/12/2020   Angina pectoris ([place]) 04/12/2020   MDD (major depressive disorder), recurrent, in full remission ([place]) 01/05/2020   MDD (major depressive disorder), recurrent, in partial remission ([place]) 10/30/2019   Tobacco use disorder 07/02/2019   Right upper quadrant pain    Encounter for other preprocedural examination 01/20/2019   PTSD (post-traumatic stress disorder) 01/20/2019   MDD (major depressive disorder), recurrent episode, moderate ([place]) 01/20/2019   Insomnia due to mental disorder 01/20/2019   Calculus of gallbladder with acute on chronic cholecystitis without obstruction 11/15/2018   Generalized abdominal pain 10/08/2018   Local superficial swelling, mass or lump 10/08/2018   Enterocolitis 08/16/2018   Left upper quadrant abdominal pain of unknown etiology 08/16/2018   Diastasis recti 06/19/2018   Generalized anxiety disorder 04/01/2018   Clotted blood in stool 12/31/2017   Diarrhea 12/31/2017   Encounter for general adult medical examination with abnormal findings 10/27/2017   Uncontrolled type 2 diabetes mellitus with hyperglycemia ([employer]) 10/27/2017   Onychomycosis with ingrown toenail 10/27/2017   Dysuria 10/27/2017   Uncontrolled type 2 diabetes mellitus with hypoglycemia without coma ([place]) 07/08/2017   Shortness of breath 07/08/2017   Cigarette nicotine dependence without complication 73/53/2992   Influenza A  05/30/2017   OSA on CPAP 08/12/2015   Cervical spondylosis 06/14/2015   Essential hypertension 04/08/2015   Depression 04/08/2015   Gastroesophageal reflux disease without esophagitis 01/05/2015   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  Conditions to be addressed/monitored:  HTN, GERD, Type II DM w/neuropathy, Depression, Tobacco use disorder  Care Plan : General Pharmacy (Adult)  Updates made by Edythe Clarity, RPH since 01/21/2021 12:00 AM     Problem: HTN, HLD, DM, GERD. Tobacco use, SOB   Priority: High  Onset Date: 08/17/2020     Long-Range Goal: Patient-Specific Goal   Start Date: 08/17/2020  Expected End Date: 02/16/2021  Recent Progress: On track  Priority: High  Note:    Current Barriers:  Unable to achieve control of glucose.  Does not adhere to prescribed medication regimen  Pharmacist Clinical Goal(s):  Patient will achieve control of glucose as evidenced by A1c testing adhere to plan to optimize therapeutic regimen for diabetes as evidenced by report of adherence to recommended medication management changes adhere to prescribed medication regimen as evidenced by fill dates. through collaboration with PharmD and provider.   Interventions: 1:1 collaboration with Lavera Guise, MD regarding development and update of comprehensive plan of care as evidenced by provider attestation and co-signature Inter-disciplinary care team collaboration (see longitudinal plan of care) Comprehensive medication review performed; medication list updated in electronic medical record  Hypertension (BP goal <130/80) -Controlled -Current treatment: Metoprolol tartrate 173m BID -Medications previously tried: none noted -Current home readings: patient reports most are around 130/80.  Did not have specific logs with him today -  Current dietary habits: now using his air fryer a lot more.  He does not  fry in butter or oil as much as he used to.  Trying to avoid carbohydrates as much as possible.  He does report sodas are his weakness.  Usually drinks about 5 to 6 sodas per day. -Current exercise habits: will go outside and walk around with his dogs 4 to 5 times per day.  Used to have a membership to [employer] but has not been back since [place]. -Denies hypotensive/hypertensive symptoms -Educated on BP goals and benefits of medications for prevention of heart attack, stroke and kidney damage; Daily salt intake goal < 2300 mg; Exercise goal of 150 minutes per week; Importance of home blood pressure monitoring; -Counseled to monitor BP at home daily, document, and provide log at future appointments -Recommended to continue current medication Recommended walking plan for 15-20 minutes per day, either outside or back in the gym.  Diabetes (A1c goal <8%) - initial goal.  Would eventually like to see him < 7. -Uncontrolled -Current medications: Farxiga 70m daily Toujeo 50 units into the skin hs Trulicity 329monce weekly -Medications previously tried: FaIran-Current home glucose readings fasting glucose: did not bring logs but reports all his readings have been "high" lately post prandial glucose:  -Reports hypoglycemic/hyperglycemic symptoms - numbess in feet, he sometimes cannot feel his dogs nibbling at his toes -Current meal patterns: SODAS 5 to 6 per day, trying to limit breads with his meats, using air fryer vs eating regular fried foods -Current exercise: walking dogs -Educated on A1c and blood sugar goals; Exercise goal of 150 minutes per week; Benefits of weight loss; Benefits of routine self-monitoring of blood sugar; Continuous glucose monitoring;  -He reports that his blood sugar was really well controlled when he was using the trial of Freestyle libre.  Has been having issues getting this approved and has seen his blood sugar control decline as he often does not prick his  finger while he is out and this means he does not take his sliding scale injection. -Also unsure if he should still be taking FaIranhe reports someone took him off of it but I cannot find documentation of that. -Counseled to check feet daily and get yearly eye exams -Recommended to continue current medication Collaborate with PCP to determine if patient should still be taking FaIran Will work with DME suppliers to get patient [employer] he qualifies based on number of injections.  Really encouraged patient to find replacement for sodas or at least limit use.  Monitoring plan initiated moving forward.  Update 10/19/20 Fasting sugars have been around 110.  With almost all < 130. Highest sugar he has seen was 210 after eating. He is liking this new combination of medications a lot better.  He did have to stop using Freestyle Libre temporarily due to low alerts that were very different from his manual meter and has not started back using it yet.  All medications currently affordable.  His A1c improved to 8.7!!   He cut out sodas completely and is watching what he eats better.  Recommend continue current meds RTC in 3 months for A1c check. Encouraged physical activity regimen.  Update 01/21/21 Has not started 40m60mf Trulicity yet, he just picked up his Rx so he will start with his first dose next week.  Had birthday parties and various celebrations so his diet and soda intake regressed.  A1c increased to 8.9.  We  discussed getting back on tract with diet.  He plans to get back to what he was doing before.  Increase of Trulicity will help.  He reports most glucose readings between 150-210.  No hypoglycemia lately. RTC in December for A1c check No changes to meds at this time   Hyperlipidemia: (LDL goal < 70) -Uncontrolled -Current treatment: Atorvastatin 69m daily Zetia 161m-Medications previously tried: simvastatin  -Current dietary patterns: see DM -Current exercise habits:  minimal -Educated on Cholesterol goals;  Benefits of statin for ASCVD risk reduction; Importance of limiting foods high in cholesterol; Exercise goal of 150 minutes per week; -He is tolerating new medications fine and reports 100% adherence with these -Counseled on diet and exercise extensively Recommended to continue current medication Recommended lipid panel at next OV.  Update 01/21/21 Continues to tolerate both meds for cholesterol Has not had lipids checked since April Would recommend recheck in December OV to see if meds have had any improvement.  Continue current meds, follow up in December.  SOB (Goal: Minimize symptoms) -Controlled -Current treatment  Breztri 160-9-4.8 mcg/act 2 puffs twice daily Albuterol HFA 90 mcg prn -Medications previously tried: Du[employer] CompaniesBreathing is much better since starting Breztri, has not had to use his rescue inhaler hardly at all.  Denies any recent SOB.  -Recommended to continue current medication Counseled on use of maintenance inhaler and to rinse mouth after use.  Tobacco use (Goal Smoking cessation) -Uncontrolled -Previous quit attempts: none -Current treatment  Bupropoion SR 15088mID -Patient triggers include: boredom -Patient smoking 1.5 ppd currently.  He wants to quit but has not had luck with patch or gum.  Is allergic to adhesive on patch. -Recommended hard candies, straws, etc to use as a replacelement.  GERD (Goal: Minimize symptoms) -Controlled -Current treatment  Pantoprazole 69m87mily Famotidine 20mg90m -Medications previously tried: none noted -Recently discovered he has hiatal hernia. -Denies any symptoms except for when he lays down at night.   -Recommended to continue current medication Recommended elevat head of the bed when he lays down.   Patient Goals/Self-Care Activities Patient will:  - take medications as prescribed focus on medication adherence by pill count check glucose daily, document, and  provide at future appointments check blood pressure daily, document, and provide at future appointments engage in dietary modifications by limiting soda intake  Follow Up Plan: The care management team will reach out to the patient again over the next 90 days.             Medication Assistance: None required.  Patient affirms current coverage meets needs.  [facility] [address] [id number]: [phone] pill box? Yes Pt endorses 90% compliance  We discussed: Benefits of medication synchronization, packaging and delivery as well as enhanced pharmacist oversight with [employer]. Patient decided to: Continue current medication management strategy  Care Plan and Follow Up Patient Decision:  Patient agrees to Care Plan and Follow-up.  Plan: The care management team will reach out to the patient again over the next 90 days.  ChrisBeverly MilchrmD Clinical Pharmacist [facility])[phone]

## 2021-01-21 ENCOUNTER — Ambulatory Visit: Payer: Medicare Other | Admitting: Pharmacist

## 2021-01-21 DIAGNOSIS — I10 Essential (primary) hypertension: Secondary | ICD-10-CM | POA: Diagnosis not present

## 2021-01-21 DIAGNOSIS — E782 Mixed hyperlipidemia: Secondary | ICD-10-CM

## 2021-01-21 DIAGNOSIS — E11649 Type 2 diabetes mellitus with hypoglycemia without coma: Secondary | ICD-10-CM

## 2021-01-21 DIAGNOSIS — K219 Gastro-esophageal reflux disease without esophagitis: Secondary | ICD-10-CM | POA: Diagnosis not present

## 2021-01-21 NOTE — Patient Instructions (Addendum)
Visit Information   Goals Addressed             This Visit's Progress    Monitor and Manage My Blood Sugar-Diabetes Type 2   On track    Timeframe:  Long-Range Goal Priority:  High Start Date:  08/17/20                           Expected End Date:   02/16/21                    Follow Up Date 11/16/20   - check blood sugar at prescribed times - check blood sugar if I feel it is too high or too low - enter blood sugar readings and medication or insulin into daily log - take the blood sugar log to all doctor visits    Why is this important?   Checking your blood sugar at home helps to keep it from getting very high or very low.  Writing the results in a diary or log helps the doctor know how to care for you.  Your blood sugar log should have the time, date and the results.  Also, write down the amount of insulin or other medicine that you take.  Other information, like what you ate, exercise done and how you were feeling, will also be helpful.     Notes: Pharmacist working on CGM from [place].       Patient Care Plan: General Pharmacy (Adult)     Problem Identified: HTN, HLD, DM, GERD. Tobacco use, SOB   Priority: High  Onset Date: 08/17/2020     Long-Range Goal: Patient-Specific Goal   Start Date: 08/17/2020  Expected End Date: 02/16/2021  Recent Progress: On track  Priority: High  Note:    Current Barriers:  Unable to achieve control of glucose.  Does not adhere to prescribed medication regimen  Pharmacist Clinical Goal(s):  Patient will achieve control of glucose as evidenced by A1c testing adhere to plan to optimize therapeutic regimen for diabetes as evidenced by report of adherence to recommended medication management changes adhere to prescribed medication regimen as evidenced by fill dates. through collaboration with PharmD and provider.   Interventions: 1:1 collaboration with Lavera Guise, MD regarding development and update of comprehensive plan of care  as evidenced by provider attestation and co-signature Inter-disciplinary care team collaboration (see longitudinal plan of care) Comprehensive medication review performed; medication list updated in electronic medical record  Hypertension (BP goal <130/80) -Controlled -Current treatment: Metoprolol tartrate 100mg  BID -Medications previously tried: none noted -Current home readings: patient reports most are around 130/80.  Did not have specific logs with him today -Current dietary habits: now using his air fryer a lot more.  He does not fry in butter or oil as much as he used to.  Trying to avoid carbohydrates as much as possible.  He does report sodas are his weakness.  Usually drinks about 5 to 6 sodas per day. -Current exercise habits: will go outside and walk around with his dogs 4 to 5 times per day.  Used to have a membership to [employer] but has not been back since [place]. -Denies hypotensive/hypertensive symptoms -Educated on BP goals and benefits of medications for prevention of heart attack, stroke and kidney damage; Daily salt intake goal < 2300 mg; Exercise goal of 150 minutes per week; Importance of home blood pressure monitoring; -Counseled to monitor BP at home daily, document,  and provide log at future appointments -Recommended to continue current medication Recommended walking plan for 15-20 minutes per day, either outside or back in the gym.  Diabetes (A1c goal <8%) - initial goal.  Would eventually like to see him < 7. -Uncontrolled -Current medications: Farxiga 10mg  daily Toujeo 50 units into the skin hs Trulicity 3mg  once weekly -Medications previously tried: [place]  -Current home glucose readings fasting glucose: did not bring logs but reports all his readings have been "high" lately post prandial glucose:  -Reports hypoglycemic/hyperglycemic symptoms - numbess in feet, he sometimes cannot feel his dogs nibbling at his toes -Current meal patterns: SODAS 5 to 6  per day, trying to limit breads with his meats, using air fryer vs eating regular fried foods -Current exercise: walking dogs -Educated on A1c and blood sugar goals; Exercise goal of 150 minutes per week; Benefits of weight loss; Benefits of routine self-monitoring of blood sugar; Continuous glucose monitoring;  -He reports that his blood sugar was really well controlled when he was using the trial of Freestyle libre.  Has been having issues getting this approved and has seen his blood sugar control decline as he often does not prick his finger while he is out and this means he does not take his sliding scale injection. -Also unsure if he should still be taking [place], he reports someone took him off of it but I cannot find documentation of that. -Counseled to check feet daily and get yearly eye exams -Recommended to continue current medication Collaborate with PCP to determine if patient should still be taking [place].  Will work with DME suppliers to get patient [employer] as he qualifies based on number of injections.  Really encouraged patient to find replacement for sodas or at least limit use.  Monitoring plan initiated moving forward.  Update 10/19/20 Fasting sugars have been around 110.  With almost all < 130. Highest sugar he has seen was 210 after eating. He is liking this new combination of medications a lot better.  He did have to stop using Freestyle Libre temporarily due to low alerts that were very different from his manual meter and has not started back using it yet.  All medications currently affordable.  His A1c improved to 8.7!!   He cut out sodas completely and is watching what he eats better.  Recommend continue current meds RTC in 3 months for A1c check. Encouraged physical activity regimen.  Update 01/21/21 Has not started 3mg  of Trulicity yet, he just picked up his Rx so he will start with his first dose next week.  Had birthday parties and various celebrations  so his diet and soda intake regressed.  A1c increased to 8.9.  We discussed getting back on tract with diet.  He plans to get back to what he was doing before.  Increase of Trulicity will help.  He reports most glucose readings between 150-210.  No hypoglycemia lately. RTC in December for A1c check No changes to meds at this time   Hyperlipidemia: (LDL goal < 70) -Uncontrolled -Current treatment: Atorvastatin 40mg  daily Zetia 10mg  -Medications previously tried: simvastatin  -Current dietary patterns: see DM -Current exercise habits: minimal -Educated on Cholesterol goals;  Benefits of statin for ASCVD risk reduction; Importance of limiting foods high in cholesterol; Exercise goal of 150 minutes per week; -He is tolerating new medications fine and reports 100% adherence with these -Counseled on diet and exercise extensively Recommended to continue current medication Recommended lipid panel at next OV.  Update 01/21/21 Continues to tolerate both meds for cholesterol Has not had lipids checked since April Would recommend recheck in December OV to see if meds have had any improvement.  Continue current meds, follow up in December.  SOB (Goal: Minimize symptoms) -Controlled -Current treatment  Breztri 160-9-4.8 mcg/act 2 puffs twice daily Albuterol HFA 90 mcg prn -Medications previously tried: [employer] -Breathing is much better since starting Breztri, has not had to use his rescue inhaler hardly at all.  Denies any recent SOB.  -Recommended to continue current medication Counseled on use of maintenance inhaler and to rinse mouth after use.  Tobacco use (Goal Smoking cessation) -Uncontrolled -Previous quit attempts: none -Current treatment  Bupropoion SR 150mg  BID -Patient triggers include: boredom -Patient smoking 1.5 ppd currently.  He wants to quit but has not had luck with patch or gum.  Is allergic to adhesive on patch. -Recommended hard candies, straws, etc to use as a  replacelement.  GERD (Goal: Minimize symptoms) -Controlled -Current treatment  Pantoprazole 40mg  daily Famotidine 20mg  BID -Medications previously tried: none noted -Recently discovered he has hiatal hernia. -Denies any symptoms except for when he lays down at night.   -Recommended to continue current medication Recommended elevat head of the bed when he lays down.   Patient Goals/Self-Care Activities Patient will:  - take medications as prescribed focus on medication adherence by pill count check glucose daily, document, and provide at future appointments check blood pressure daily, document, and provide at future appointments engage in dietary modifications by limiting soda intake  Follow Up Plan: The care management team will reach out to the patient again over the next 90 days.           Patient verbalizes understanding of instructions provided today and agrees to view in [place].  Telephone follow up appointment with pharmacy team member scheduled for: 3 months  Edythe Clarity, [place]

## 2021-01-26 ENCOUNTER — Other Ambulatory Visit: Payer: Medicare Other

## 2021-01-27 ENCOUNTER — Encounter: Payer: Self-pay | Admitting: Psychiatry

## 2021-01-27 ENCOUNTER — Other Ambulatory Visit: Payer: Self-pay

## 2021-01-27 ENCOUNTER — Telehealth (INDEPENDENT_AMBULATORY_CARE_PROVIDER_SITE_OTHER): Payer: Medicare Other | Admitting: Psychiatry

## 2021-01-27 DIAGNOSIS — G4733 Obstructive sleep apnea (adult) (pediatric): Secondary | ICD-10-CM

## 2021-01-27 DIAGNOSIS — F4310 Post-traumatic stress disorder, unspecified: Secondary | ICD-10-CM

## 2021-01-27 DIAGNOSIS — F17200 Nicotine dependence, unspecified, uncomplicated: Secondary | ICD-10-CM

## 2021-01-27 DIAGNOSIS — F3342 Major depressive disorder, recurrent, in full remission: Secondary | ICD-10-CM

## 2021-01-27 DIAGNOSIS — F5105 Insomnia due to other mental disorder: Secondary | ICD-10-CM

## 2021-01-27 NOTE — Progress Notes (Signed)
Virtual Visit via Video Note  I connected with Raymond [name] on 01/27/21 at 11:30 AM EDT by a video enabled telemedicine application and verified that I am speaking with the correct person using two identifiers.  Location Provider Location : [facility] Patient Location : Home  Participants: Patient , Provider   I discussed the limitations of evaluation and management by telemedicine and the availability of in person appointments. The patient expressed understanding and agreed to proceed.   I discussed the assessment and treatment plan with the patient. The patient was provided an opportunity to ask questions and all were answered. The patient agreed with the plan and demonstrated an understanding of the instructions.   The patient was advised to call back or seek an in-person evaluation if the symptoms worsen or if the condition fails to improve as anticipated.   Raymond Mills MD OP Progress Note  01/27/2021 11:50 AM Raymond [name]  MRN:  [id number]  Chief Complaint:  Chief Complaint   Follow-up; Anxiety; Depression; Insomnia    HPI: Raymond [name] is a 55 year old male, divorced, has a history of MDD, PTSD, tobacco use disorder, insomnia, neuropathy, uncontrolled diabetes, hyperlipidemia, hepatic steatosis, lives in [place] was evaluated by telemedicine today.  Patient today reports he continues to struggle with sleep.  He had sleep study completed, reviewed and discussed results of sleep study -01/07/2021.  Patient with sleep apnea.  Patient continues to take the temazepam which helps him to some extent.  He sleeps anywhere between 4 to 6 hours every night.  Patient does not feel rested when he wakes up in the morning likely due to sleep apnea.  Patient otherwise denies any mood problems.  Reports compliant on medications.  Denies side effects.  Denies any suicidality, homicidality or perceptual disturbances.  Patient denies any other concerns today.  Visit Diagnosis:    ICD-10-CM    1. MDD (major depressive disorder), recurrent, in full remission ([place])  F33.42     2. PTSD (post-traumatic stress disorder)  F43.10     3. Tobacco use disorder  F17.200     4. Insomnia due to mental disorder  F51.05    mood    5. OSA (obstructive sleep apnea)  G47.33       Past Psychiatric History: Reviewed past psychiatric history from progress note on 05/23/2018.  Past trials of Prozac, Zoloft, Celexa, Tegretol, doxepin, Klonopin, Seroquel, mirtazapine, Belsomra, Lunesta, Ativan, Rozerem, Lexapro  Past Medical History:  Past Medical History:  Diagnosis Date   Anxiety    Arthritis    NECK   Bronchitis    Coronary artery disease    Depression    Diabetes mellitus without complication ([place])    Diastasis of rectus abdominis 06/19/2018   Dyspnea    DUE TO GALLBLADDER PER PT   Emphysema lung ([place])    Family history of adverse reaction to anesthesia    N/V, TROUBLE BREATHING COMING OUT OF ANESTHESIA   Fatty liver    GERD (gastroesophageal reflux disease)    Headache    MIGRAINES   Hyperlipidemia    Hypertension    Irregular heart beat unk   Myocardial infarction ([place]) 2010   MILD    PTSD (post-traumatic stress disorder)    Sleep apnea    USES CPAP   Tachycardia     Past Surgical History:  Procedure Laterality Date    ingrown toenail removal     CHOLECYSTECTOMY N/A 01/23/2019   Procedure: LAPAROSCOPIC CHOLECYSTECTOMY;  Surgeon: Olean Ree, MD;  Location: [facility] ORS;  Service: General;  Laterality: N/A;   CLAVICLE SURGERY Right    COLONOSCOPY  2017   COLONOSCOPY WITH PROPOFOL N/A 08/25/2020   Procedure: COLONOSCOPY WITH PROPOFOL;  Surgeon: Virgel Manifold, MD;  Location: [facility] END[facility]COPY;  Service: Endoscopy;  Laterality: N/A;   ESOPHAGOGASTRODUODENOSCOPY (EGD) WITH PROPOFOL N/A 08/25/2020   Procedure: ESOPHAGOGASTRODUODENOSCOPY (EGD) WITH PROPOFOL;  Surgeon: Virgel Manifold, MD;  Location: [facility] ENDOSCOPY;  Service: Endoscopy;  Laterality: N/A;   MOUTH SURGERY      REMOVED ALL TEETH   SHOULDER SURGERY Right    UPPER GI ENDOSCOPY  2017    Family Psychiatric History: Reviewed family psychiatric history from progress note on 05/23/2018  Family History:  Family History  Problem Relation Age of Onset   Hypertension Mother    Diabetes type II Mother    Diabetes Mother    COPD Father    Cancer Father    Heart disease Father    Diabetes Sister    Anxiety disorder Sister    Depression Sister    Diabetes Brother    Anxiety disorder Brother    Depression Brother    Diabetes Maternal Aunt    Diabetes Sister    Anxiety disorder Sister    Depression Sister    Diabetes Sister    Anxiety disorder Sister    Depression Sister    Diabetes Sister    Anxiety disorder Sister    Depression Sister    Colon cancer Maternal Uncle     Social History: Reviewed social history from progress note on 05/23/2018 Social History   Socioeconomic History   Marital status: Divorced    Spouse name: Not on file   Number of children: 4   Years of education: Not on file   Highest education level: 8th grade  Occupational History   Not on file  Tobacco Use   Smoking status: Every Day    Packs/day: 1.50    Years: 40.00    Pack years: 60.00    Types: Cigarettes   Smokeless tobacco: Former    Types: Chew   Tobacco comments:    1 pack per day reported 01/03/2021  Vaping Use   Vaping Use: Former  Substance and Sexual Activity   Alcohol use: No    Alcohol/week: 0.0 standard drinks   Drug use: No   Sexual activity: Not Currently  Other Topics Concern   Not on file  Social History Narrative   Not on file   Social Determinants of Health   Financial Resource Strain: Low Risk    Difficulty of Paying Living Expenses: Not very hard  Food Insecurity: Not on file  Transportation Needs: Not on file  Physical Activity: Not on file  Stress: Not on file  Social Connections: Not on file    Allergies:  Allergies  Allergen Reactions   Onion Anaphylaxis    Hydrocodone    Nicoderm [Nicotine]    Norco [Hydrocodone-Acetaminophen] Itching   Hydrocodone-Acetaminophen Itching   Nickel Rash    Metabolic Disorder Labs: Lab Results  Component Value Date   HGBA1C 8.9 (A) 01/17/2021   MPG 294.83 05/31/2017   No results found for: PROLACTIN Lab Results  Component Value Date   CHOL 280 (H) 08/17/2020   TRIG 501 (H) 08/17/2020   HDL 31 (L) 08/17/2020   CHOLHDL 4.8 07/15/2015   VLDL 26 12/09/2012   LDLCALC 153 (H) 08/17/2020   LDLCALC 61 07/15/2015   Lab Results  Component Value Date  TSH 1.890 08/17/2020   TSH 4.120 06/10/2018    Therapeutic Level Labs: No results found for: LITHIUM No results found for: VALPROATE No components found for:  CBMZ  Current Medications: Current Outpatient Medications  Medication Sig Dispense Refill   ACCU-CHEK FASTCLIX LANCETS MISC yse as directed twice a day 100 each 1   albuterol (PROAIR HFA) 108 (90 Base) MCG/ACT inhaler Inhale 2-4 puffs by mouth Every 4-6 hours as needed for wheezing, cough, and/or shortness of breath 3 Inhaler 1   aspirin EC 81 MG tablet Take 81 mg by mouth daily at 3 pm.      atorvastatin (LIPITOR) 40 MG tablet Take 1 tablet (40 mg total) by mouth daily. 90 tablet 3   Budeson-Glycopyrrol-Formoterol (BREZTRI AEROSPHERE) 160-9-4.8 MCG/ACT AERO Inhale 2 puffs into the lungs 2 (two) times daily. 10.7 g 11   buPROPion (WELLBUTRIN XL) 300 MG 24 hr tablet Take 1 tablet (300 mg total) by mouth daily. 90 tablet 0   colestipol (COLESTID) 1 g tablet Take 2 tablets (2 g total) by mouth daily for 30 doses. 60 tablet 2   dapagliflozin propanediol (FARXIGA) 10 MG TABS tablet Take 1 tablet (10 mg total) by mouth daily before breakfast. 90 tablet 2   Dulaglutide 3 MG/0.5ML SOPN Inject 3 mg into the skin once a week. 6 mL 2   DULoxetine (CYMBALTA) 60 MG capsule Take 1 capsule (60 mg total) by mouth daily. 90 capsule 0   ezetimibe (ZETIA) 10 MG tablet Take 1 tablet (10 mg total) by mouth daily. 30  tablet 2   famotidine (PEPCID) 20 MG tablet Take 1 tablet (20 mg total) by mouth 2 (two) times daily. 180 tablet 1   glucose blood (ACCU-CHEK AVIVA PLUS) test strip Blood sugar testing TID and as needed. E11.65 100 each 12   hydrOXYzine (ATARAX/VISTARIL) 10 MG tablet TAKE 1 TO 2 TABLETS BY MOUTH AT BEDTIME AS NEEDED FOR SLEEP 30 tablet 3   insulin glargine, 2 Unit Dial, (TOUJEO MAX SOLOSTAR) 300 UNIT/ML Solostar Pen Inject 50 Units into the skin daily. May decrease to 40 units at bedtime if having episodes of hypoglycemia. 10 mL 3   Insulin Pen Needle 32G X 4 MM MISC Use with daily dose insulin and with sliding scale insulin as needed. 100 each 5   MELATONIN PO Take 10 mg by mouth daily.     metoprolol tartrate (LOPRESSOR) 100 MG tablet Take 1 tablet (100 mg total) by mouth 2 (two) times daily. 180 tablet 1   nitroGLYCERIN (NITROSTAT) 0.4 MG SL tablet Place 1 tablet (0.4 mg total) under the tongue every 5 (five) minutes x 3 doses as needed for chest pain. 30 tablet 1   pantoprazole (PROTONIX) 40 MG tablet Take 1 tablet (40 mg total) by mouth daily. 90 tablet 1   polyethylene glycol (MIRALAX) 17 g packet Take 17 g by mouth daily. 30 each 0   temazepam (RESTORIL) 30 MG capsule Take 1 capsule (30 mg total) by mouth at bedtime. 30 capsule 1   No current facility-administered medications for this visit.     Musculoskeletal: Strength & Muscle Tone:  UTA Gait & Station:  Seated Patient leans: N/A  Psychiatric Specialty Exam: Review of Systems  Psychiatric/Behavioral:  Positive for sleep disturbance.   All other systems reviewed and are negative.  There were no vitals taken for this visit.There is no height or weight on file to calculate BMI.  General Appearance: Casual  Eye Contact:  Good  Speech:  Clear and Coherent  Volume:  Normal  Mood:  Euthymic  Affect:  Congruent  Thought Process:  Goal Directed and Descriptions of Associations: Intact  Orientation:  Full (Time, Place, and Person)   Thought Content: Logical   Suicidal Thoughts:  No  Homicidal Thoughts:  No  Memory:  Immediate;   Fair Recent;   Fair Remote;   Fair  Judgement:  Fair  Insight:  Fair  Psychomotor Activity:  Normal  Concentration:  Concentration: Fair and Attention Span: Fair  Recall:  [employer] of Knowledge: Fair  Language: Fair  Akathisia:  No  Handed:  Right  AIMS (if indicated): done  Assets:  Communication Skills Desire for Improvement Social Support Transportation  ADL's:  Intact  Cognition: WNL  Sleep:   restless   Screenings: GAD-7    Flowsheet Row Office Visit from 11/22/2020 in [place]  Total GAD-7 Score 11      [place] from 10/18/2020 in [facility], [place] from 10/17/2019 in [facility], [place] from 10/08/2018 in [facility], [place] from 10/05/2017 in [facility], [facility]  Total Score (max 30 points ) 29 30 27 30       PHQ2-9    [place] Visit from 01/17/2021 in [facility], [place] from 11/22/2020 in [place] from 10/18/2020 in [facility], [place] from 09/21/2020 in [facility], [place] from 08/31/2020 in [facility]  PHQ-2 Total Score 0 4 0 0 0  PHQ-9 Total Score -- 15 -- -- --      [place] Visit from 11/22/2020 in [place] Admission (Discharged) from 08/25/2020 in [place] Video Visit from 06/23/2020 in [place] No Risk No Risk Low Risk        Assessment and Plan: Raymond [name] is a 55 year old Caucasian male, lives in [place], divorced, disabled, has a history of PTSD, MDD, insomnia, diabetes mellitus, history of vision loss, neuropathy was  evaluated by telemedicine today.  Patient is currently struggling with sleep problems, had recent sleep study, will need CPAP titration.  Discussed plan as noted below.  Plan MDD in remission Cymbalta 60 mg p.o. daily Wellbutrin XL 300 mg p.o. daily  Insomnia-unstable Temazepam 30 mg p.o. nightly  Obstructive sleep apnea-unstable Patient had sleep study completed-reviewed sleep study report-dated 01/05/2021 per Dr. Lisabeth Devoid with mild obstructive sleep apnea as well as chronic insomnia disorder.  Patient will benefit from CPAP titration.  Have referred him for the same.  Patient advised to contact the office if he does not hear back from sleep clinic.  PTSD-stable Cymbalta 60 mg p.o. daily  Tobacco use disorder-unstable Provided counseling for 2 minutes.  Follow-up in clinic in 2 months or sooner if needed.  This note was generated in part or whole with voice recognition software. Voice recognition is usually quite accurate but there are transcription errors that can and very often do occur. I apologize for any typographical errors that were not detected and corrected.      Ursula Alert, MD 01/28/2021, 8:24 AM

## 2021-02-01 ENCOUNTER — Ambulatory Visit
Admission: RE | Admit: 2021-02-01 | Discharge: 2021-02-01 | Disposition: A | Discharge status: Home / Self Care | Payer: Medicare Other | Source: Ambulatory Visit | Attending: Nurse Practitioner | Admitting: Nurse Practitioner

## 2021-02-01 DIAGNOSIS — K760 Fatty (change of) liver, not elsewhere classified: Secondary | ICD-10-CM | POA: Diagnosis not present

## 2021-02-01 DIAGNOSIS — R1011 Right upper quadrant pain: Secondary | ICD-10-CM | POA: Diagnosis not present

## 2021-02-02 ENCOUNTER — Ambulatory Visit: Payer: Medicare Other | Admitting: Nurse Practitioner

## 2021-02-14 ENCOUNTER — Telehealth: Payer: Medicare Other | Admitting: Psychiatry

## 2021-02-18 ENCOUNTER — Other Ambulatory Visit
Admission: RE | Admit: 2021-02-18 | Discharge: 2021-02-18 | Disposition: A | Discharge status: Home / Self Care | Payer: Medicare Other | Source: Ambulatory Visit | Attending: Psychiatry | Admitting: Psychiatry

## 2021-02-18 ENCOUNTER — Other Ambulatory Visit: Payer: Self-pay

## 2021-02-18 DIAGNOSIS — Z20822 Contact with and (suspected) exposure to covid-19: Secondary | ICD-10-CM | POA: Diagnosis not present

## 2021-02-18 DIAGNOSIS — Z01812 Encounter for preprocedural laboratory examination: Secondary | ICD-10-CM | POA: Diagnosis not present

## 2021-02-18 LAB — SARS CORONAVIRUS 2 (TAT 6-24 HRS): SARS Coronavirus 2: NEGATIVE

## 2021-02-22 ENCOUNTER — Ambulatory Visit: Payer: Medicare Other | Attending: Neurology

## 2021-02-22 DIAGNOSIS — G4761 Periodic limb movement disorder: Secondary | ICD-10-CM | POA: Diagnosis not present

## 2021-02-22 DIAGNOSIS — G4733 Obstructive sleep apnea (adult) (pediatric): Secondary | ICD-10-CM | POA: Diagnosis not present

## 2021-02-23 ENCOUNTER — Other Ambulatory Visit: Payer: Self-pay

## 2021-02-28 ENCOUNTER — Telehealth: Payer: Self-pay | Admitting: Student-PharmD

## 2021-02-28 NOTE — Progress Notes (Addendum)
Chronic Care Management Pharmacy Assistant   Name: Raymond [name]  MRN: [id number] DOB: 04/23/1966  Reason for Encounter: Disease State For DM.   Conditions to be addressed/monitored: HTN, GERD, Type II DM w/neuropathy, Depression, Tobacco use disorder  Recent office visits:  None since last Pharm D appointment on 01/21/21  Recent consult visits:  01/27/21 Behavioral Health Deirdre Pippins, MD. For major depression.   Hospital visits:  None since last Pharm D appointment on 01/21/21  Medications: Outpatient Encounter Medications as of 02/28/2021  Medication Sig   ACCU-CHEK FASTCLIX LANCETS MISC yse as directed twice a day   albuterol (PROAIR HFA) 108 (90 Base) MCG/ACT inhaler Inhale 2-4 puffs by mouth Every 4-6 hours as needed for wheezing, cough, and/or shortness of breath   aspirin EC 81 MG tablet Take 81 mg by mouth daily at 3 pm.    atorvastatin (LIPITOR) 40 MG tablet Take 1 tablet (40 mg total) by mouth daily.   Budeson-Glycopyrrol-Formoterol (BREZTRI AEROSPHERE) 160-9-4.8 MCG/ACT AERO Inhale 2 puffs into the lungs 2 (two) times daily.   buPROPion (WELLBUTRIN XL) 300 MG 24 hr tablet Take 1 tablet (300 mg total) by mouth daily.   colestipol (COLESTID) 1 g tablet Take 2 tablets (2 g total) by mouth daily for 30 doses.   dapagliflozin propanediol (FARXIGA) 10 MG TABS tablet Take 1 tablet (10 mg total) by mouth daily before breakfast.   Dulaglutide 3 MG/0.5ML SOPN Inject 3 mg into the skin once a week.   DULoxetine (CYMBALTA) 60 MG capsule Take 1 capsule (60 mg total) by mouth daily.   ezetimibe (ZETIA) 10 MG tablet Take 1 tablet (10 mg total) by mouth daily.   famotidine (PEPCID) 20 MG tablet Take 1 tablet (20 mg total) by mouth 2 (two) times daily.   glucose blood (ACCU-CHEK AVIVA PLUS) test strip Blood sugar testing TID and as needed. E11.65   hydrOXYzine (ATARAX/VISTARIL) 10 MG tablet TAKE 1 TO 2 TABLETS BY MOUTH AT BEDTIME AS NEEDED FOR SLEEP   insulin glargine, 2 Unit  Dial, (TOUJEO MAX SOLOSTAR) 300 UNIT/ML Solostar Pen Inject 50 Units into the skin daily. May decrease to 40 units at bedtime if having episodes of hypoglycemia.   Insulin Pen Needle 32G X 4 MM MISC Use with daily dose insulin and with sliding scale insulin as needed.   MELATONIN PO Take 10 mg by mouth daily.   metoprolol tartrate (LOPRESSOR) 100 MG tablet Take 1 tablet (100 mg total) by mouth 2 (two) times daily.   nitroGLYCERIN (NITROSTAT) 0.4 MG SL tablet Place 1 tablet (0.4 mg total) under the tongue every 5 (five) minutes x 3 doses as needed for chest pain.   pantoprazole (PROTONIX) 40 MG tablet Take 1 tablet (40 mg total) by mouth daily.   polyethylene glycol (MIRALAX) 17 g packet Take 17 g by mouth daily.   temazepam (RESTORIL) 30 MG capsule Take 1 capsule (30 mg total) by mouth at bedtime.   No facility-administered encounter medications on file as of 02/28/2021.   Recent Relevant Labs: Lab Results  Component Value Date/Time   HGBA1C 8.9 (A) 01/17/2021 02:01 PM   HGBA1C 8.7 (A) 10/18/2020 01:48 PM   HGBA1C 11.9 (H) 05/31/2017 04:49 AM   HGBA1C 8.5 (H) 07/15/2015 06:32 PM   HGBA1C 6.2 12/09/2012 05:30 AM    Kidney Function Lab Results  Component Value Date/Time   CREATININE 1.06 08/17/2020 10:03 AM   CREATININE 1.05 03/09/2020 12:48 PM   CREATININE 1.25 11/28/2013 03:06 PM  CREATININE 1.05 07/31/2013 07:50 PM   GFRNONAA >60 03/09/2020 12:48 PM   GFRNONAA >60 11/28/2013 03:06 PM   GFRAA >60 01/25/2019 06:23 AM   GFRAA >60 11/28/2013 03:06 PM    Current antihyperglycemic regimen:  Wilder Glade 10mg  daily Toujeo 50 units into the skin hs Trulicity 3mg  once weekly  What recent interventions/DTPs have been made to improve glycemic control:  None.   Have there been any recent hospitalizations or ED visits since last visit with CPP? Patient stated no.   Patient denies hypoglycemic symptoms, including None  Patient denies hyperglycemic symptoms, including none  How often are  you checking your blood sugar? Patient stated about once daily  What are your blood sugars ranging?  Patient stated his blood sugars range around the normal range. He was not able to give me any blood sugars at this time.   During the week, how often does your blood glucose drop below 70?  Patient stated Never.  Are you checking your feet daily/regularly?  Patient reminded to check his feet regularly.   Adherence Review: Is the patient currently on a STATIN medication? Atorvastatin 40 mg   Is the patient currently on ACE/ARB medication? N/A  Does the patient have >5 day gap between last estimated fill dates? Per misc rpts, no.  Care Gaps:Patient is due for eye exam.   Star Rating Drugs:Farxiga 10 mg 12/07/20 90 DS. Atorvastatin 40 mg 12/14/20 90 DS.  Follow-Up:Pharmacist Review  Charlann Lange, RMA Clinical Pharmacist Assistant [phone]  5 minutes spent in review, coordination, and documentation.  Reviewed by: Alena Bills, PharmD Clinical Pharmacist [phone]

## 2021-03-15 ENCOUNTER — Other Ambulatory Visit: Payer: Self-pay | Admitting: Psychiatry

## 2021-03-15 DIAGNOSIS — F5105 Insomnia due to other mental disorder: Secondary | ICD-10-CM

## 2021-03-23 DIAGNOSIS — I10 Essential (primary) hypertension: Secondary | ICD-10-CM

## 2021-03-23 DIAGNOSIS — K219 Gastro-esophageal reflux disease without esophagitis: Secondary | ICD-10-CM

## 2021-03-31 ENCOUNTER — Telehealth (INDEPENDENT_AMBULATORY_CARE_PROVIDER_SITE_OTHER): Payer: Medicare Other | Admitting: Psychiatry

## 2021-03-31 ENCOUNTER — Other Ambulatory Visit: Payer: Self-pay

## 2021-03-31 ENCOUNTER — Encounter: Payer: Self-pay | Admitting: Psychiatry

## 2021-03-31 DIAGNOSIS — F17200 Nicotine dependence, unspecified, uncomplicated: Secondary | ICD-10-CM | POA: Diagnosis not present

## 2021-03-31 DIAGNOSIS — F4310 Post-traumatic stress disorder, unspecified: Secondary | ICD-10-CM | POA: Diagnosis not present

## 2021-03-31 DIAGNOSIS — F331 Major depressive disorder, recurrent, moderate: Secondary | ICD-10-CM | POA: Diagnosis not present

## 2021-03-31 DIAGNOSIS — G4701 Insomnia due to medical condition: Secondary | ICD-10-CM

## 2021-03-31 MED ORDER — BUPROPION HCL ER (XL) 150 MG PO TB24: 150.0000 mg | ORAL_TABLET | Freq: Every day | ORAL | 0 refills | Status: DC

## 2021-03-31 MED ORDER — HYDROXYZINE PAMOATE 25 MG PO CAPS: 25.0000 mg | ORAL_CAPSULE | Freq: Every evening | ORAL | 1 refills | Status: DC | PRN

## 2021-03-31 MED ORDER — BUPROPION HCL ER (XL) 300 MG PO TB24: 300.0000 mg | ORAL_TABLET | Freq: Every day | ORAL | 0 refills | Status: DC

## 2021-03-31 MED ORDER — TEMAZEPAM 30 MG PO CAPS: 30.0000 mg | ORAL_CAPSULE | Freq: Every evening | ORAL | 1 refills | Status: DC | PRN

## 2021-03-31 NOTE — Progress Notes (Signed)
Virtual Visit via Video Note  I connected with Raymond [name] on 03/31/21 at  4:00 PM EST by a video enabled telemedicine application and verified that I am speaking with the correct person using two identifiers.  Location Provider Location : [facility] Patient Location : Home  Participants: Patient , Provider   I discussed the limitations of evaluation and management by telemedicine and the availability of in person appointments. The patient expressed understanding and agreed to proceed.  I discussed the assessment and treatment plan with the patient. The patient was provided an opportunity to ask questions and all were answered. The patient agreed with the plan and demonstrated an understanding of the instructions.   The patient was advised to call back or seek an in-person evaluation if the symptoms worsen or if the condition fails to improve as anticipated.   [place] MD OP Progress Note  03/31/2021 2:54 PM Raymond [name]  MRN:  [id number]  Chief Complaint:  Chief Complaint   Follow-up; Anxiety; Insomnia    HPI: Raymond [name] is a 55 year old male, divorced, has a history of MDD, PTSD, tobacco use disorder, insomnia, neuropathy, uncontrolled diabetes, hyperlipidemia, hepatic steatosis, lives in [place] was evaluated by telemedicine today.  Patient today reports he has been struggling with depressive symptoms since the past few weeks.  He does struggle with sadness, low mood, low appetite, low energy level, feeling tired, concentration problems, feeling restless.  Patient also reports sleep problems.  The temazepam helps him to fall asleep however it does not keep him asleep like it used to before.  He reports he was also told he may have Periodic limb movement disorder.  Currently following up with a sleep specialist for the same.  Reports he is compliant on the CPAP.  Patient is compliant on his medications as prescribed.  Patient denies any suicidality, homicidality or  perceptual disturbances.  Patient denies any other concerns today.  Visit Diagnosis:    ICD-10-CM   1. MDD (major depressive disorder), recurrent episode, moderate (HCC)  F33.1 temazepam (RESTORIL) 30 MG capsule    hydrOXYzine (VISTARIL) 25 MG capsule    buPROPion (WELLBUTRIN XL) 150 MG 24 hr tablet    2. PTSD (post-traumatic stress disorder)  F43.10 temazepam (RESTORIL) 30 MG capsule    hydrOXYzine (VISTARIL) 25 MG capsule    3. Tobacco use disorder  F17.200 buPROPion (WELLBUTRIN XL) 300 MG 24 hr tablet    4. Insomnia due to medical condition  G47.01 buPROPion (WELLBUTRIN XL) 300 MG 24 hr tablet   osa      Past Psychiatric History: Reviewed past psychiatric history from progress note on 05/23/2018.  Past trials of Prozac, Zoloft, Celexa, Tegretol, doxepin, Klonopin, Seroquel, mirtazapine, Belsomra, Lunesta, Ativan, Rozerem, Lexapro  Past Medical History:  Past Medical History:  Diagnosis Date   Anxiety    Arthritis    NECK   Bronchitis    Coronary artery disease    Depression    Diabetes mellitus without complication ([place])    Diastasis of rectus abdominis 06/19/2018   Dyspnea    DUE TO GALLBLADDER PER PT   Emphysema lung (HCC)    Family history of adverse reaction to anesthesia    N/V, TROUBLE BREATHING COMING OUT OF ANESTHESIA   Fatty liver    GERD (gastroesophageal reflux disease)    Headache    MIGRAINES   Hyperlipidemia    Hypertension    Irregular heart beat unk   Myocardial infarction ([place]) 2010   MILD  PTSD (post-traumatic stress disorder)    Sleep apnea    USES CPAP   Tachycardia     Past Surgical History:  Procedure Laterality Date    ingrown toenail removal     CHOLECYSTECTOMY N/A 01/23/2019   Procedure: LAPAROSCOPIC CHOLECYSTECTOMY;  Surgeon: Olean Ree, MD;  Location: [facility] ORS;  Service: General;  Laterality: N/A;   CLAVICLE SURGERY Right    COLONOSCOPY  2017   COLONOSCOPY WITH PROPOFOL N/A 08/25/2020   Procedure: COLONOSCOPY WITH PROPOFOL;   Surgeon: Virgel Manifold, MD;  Location: [facility] END[facility]COPY;  Service: Endoscopy;  Laterality: N/A;   ESOPHAGOGASTRODUODENOSCOPY (EGD) WITH PROPOFOL N/A 08/25/2020   Procedure: ESOPHAGOGASTRODUODENOSCOPY (EGD) WITH PROPOFOL;  Surgeon: Virgel Manifold, MD;  Location: [facility] ENDOSCOPY;  Service: Endoscopy;  Laterality: N/A;   MOUTH SURGERY     REMOVED ALL TEETH   SHOULDER SURGERY Right    UPPER GI ENDOSCOPY  2017    Family Psychiatric History: Reviewed family psychiatric history from progress note on 05/23/2018  Family History:  Family History  Problem Relation Age of Onset   Hypertension Mother    Diabetes type II Mother    Diabetes Mother    COPD Father    Cancer Father    Heart disease Father    Diabetes Sister    Anxiety disorder Sister    Depression Sister    Diabetes Brother    Anxiety disorder Brother    Depression Brother    Diabetes Maternal Aunt    Diabetes Sister    Anxiety disorder Sister    Depression Sister    Diabetes Sister    Anxiety disorder Sister    Depression Sister    Diabetes Sister    Anxiety disorder Sister    Depression Sister    Colon cancer Maternal Uncle     Social History: Reviewed social history from progress note on 05/23/2018 Social History   Socioeconomic History   Marital status: Divorced    Spouse name: Not on file   Number of children: 4   Years of education: Not on file   Highest education level: 8th grade  Occupational History   Not on file  Tobacco Use   Smoking status: Every Day    Packs/day: 1.50    Years: 40.00    Pack years: 60.00    Types: Cigarettes   Smokeless tobacco: Former    Types: Chew   Tobacco comments:    1 pack per day reported 01/03/2021  Vaping Use   Vaping Use: Former  Substance and Sexual Activity   Alcohol use: No    Alcohol/week: 0.0 standard drinks   Drug use: No   Sexual activity: Not Currently  Other Topics Concern   Not on file  Social History Narrative   Not on file   Social  Determinants of Health   Financial Resource Strain: Low Risk    Difficulty of Paying Living Expenses: Not very hard  Food Insecurity: Not on file  Transportation Needs: Not on file  Physical Activity: Not on file  Stress: Not on file  Social Connections: Not on file    Allergies:  Allergies  Allergen Reactions   Onion Anaphylaxis   Hydrocodone    Nicoderm [Nicotine]    Norco [Hydrocodone-Acetaminophen] Itching   Hydrocodone-Acetaminophen Itching   Nickel Rash    Metabolic Disorder Labs: Lab Results  Component Value Date   HGBA1C 8.9 (A) 01/17/2021   MPG 294.83 05/31/2017   No results found for: PROLACTIN Lab Results  Component Value Date   CHOL 280 (H) 08/17/2020   TRIG 501 (H) 08/17/2020   HDL 31 (L) 08/17/2020   CHOLHDL 4.8 07/15/2015   VLDL 26 12/09/2012   LDLCALC 153 (H) 08/17/2020   LDLCALC 61 07/15/2015   Lab Results  Component Value Date   TSH 1.890 08/17/2020   TSH 4.120 06/10/2018    Therapeutic Level Labs: No results found for: LITHIUM No results found for: VALPROATE No components found for:  CBMZ  Current Medications: Current Outpatient Medications  Medication Sig Dispense Refill   buPROPion (WELLBUTRIN XL) 150 MG 24 hr tablet Take 1 tablet (150 mg total) by mouth daily. Take along with 300 mg daily 90 tablet 0   hydrOXYzine (VISTARIL) 25 MG capsule Take 1-2 capsules (25-50 mg total) by mouth at bedtime as needed. 60 capsule 1   temazepam (RESTORIL) 30 MG capsule Take 1 capsule (30 mg total) by mouth at bedtime as needed for sleep. 30 capsule 1   ACCU-CHEK FASTCLIX LANCETS MISC yse as directed twice a day 100 each 1   albuterol (PROAIR HFA) 108 (90 Base) MCG/ACT inhaler Inhale 2-4 puffs by mouth Every 4-6 hours as needed for wheezing, cough, and/or shortness of breath 3 Inhaler 1   aspirin EC 81 MG tablet Take 81 mg by mouth daily at 3 pm.      atorvastatin (LIPITOR) 40 MG tablet Take 1 tablet (40 mg total) by mouth daily. 90 tablet 3    Budeson-Glycopyrrol-Formoterol (BREZTRI AEROSPHERE) 160-9-4.8 MCG/ACT AERO Inhale 2 puffs into the lungs 2 (two) times daily. 10.7 g 11   buPROPion (WELLBUTRIN XL) 300 MG 24 hr tablet Take 1 tablet (300 mg total) by mouth daily. 90 tablet 0   colestipol (COLESTID) 1 g tablet Take 2 tablets (2 g total) by mouth daily for 30 doses. 60 tablet 2   dapagliflozin propanediol (FARXIGA) 10 MG TABS tablet Take 1 tablet (10 mg total) by mouth daily before breakfast. 90 tablet 2   Dulaglutide 3 MG/0.5ML SOPN Inject 3 mg into the skin once a week. 6 mL 2   DULoxetine (CYMBALTA) 60 MG capsule Take 1 capsule (60 mg total) by mouth daily. 90 capsule 0   ezetimibe (ZETIA) 10 MG tablet Take 1 tablet (10 mg total) by mouth daily. 30 tablet 2   famotidine (PEPCID) 20 MG tablet Take 1 tablet (20 mg total) by mouth 2 (two) times daily. 180 tablet 1   glucose blood (ACCU-CHEK AVIVA PLUS) test strip Blood sugar testing TID and as needed. E11.65 100 each 12   insulin glargine, 2 Unit Dial, (TOUJEO MAX SOLOSTAR) 300 UNIT/ML Solostar Pen Inject 50 Units into the skin daily. May decrease to 40 units at bedtime if having episodes of hypoglycemia. 10 mL 3   Insulin Pen Needle 32G X 4 MM MISC Use with daily dose insulin and with sliding scale insulin as needed. 100 each 5   MELATONIN PO Take 10 mg by mouth daily.     metoprolol tartrate (LOPRESSOR) 100 MG tablet Take 1 tablet (100 mg total) by mouth 2 (two) times daily. 180 tablet 1   nitroGLYCERIN (NITROSTAT) 0.4 MG SL tablet Place 1 tablet (0.4 mg total) under the tongue every 5 (five) minutes x 3 doses as needed for chest pain. 30 tablet 1   pantoprazole (PROTONIX) 40 MG tablet Take 1 tablet (40 mg total) by mouth daily. 90 tablet 1   polyethylene glycol (MIRALAX) 17 g packet Take 17 g by mouth daily. 30 each  0   No current facility-administered medications for this visit.     Musculoskeletal: Strength & Muscle Tone:  UTA Gait & Station: normal Patient leans:  N/A  Psychiatric Specialty Exam: Review of Systems  Psychiatric/Behavioral:  Positive for decreased concentration, dysphoric mood and sleep disturbance.   All other systems reviewed and are negative.  There were no vitals taken for this visit.There is no height or weight on file to calculate BMI.  General Appearance: Casual  Eye Contact:  Fair  Speech:  Clear and Coherent  Volume:  Normal  Mood:  Depressed  Affect:  Congruent  Thought Process:  Goal Directed and Descriptions of Associations: Intact  Orientation:  Full (Time, Place, and Person)  Thought Content: Logical   Suicidal Thoughts:  No  Homicidal Thoughts:  No  Memory:  Immediate;   Fair Recent;   Fair Remote;   Fair  Judgement:  Fair  Insight:  Fair  Psychomotor Activity:  Normal  Concentration:  Concentration: Fair and Attention Span: Fair  Recall:  [employer] of Knowledge: Fair  Language: Fair  Akathisia:  No  Handed:  Right  AIMS (if indicated): done, 0  Assets:  Communication Skills Desire for Improvement Housing Social Support  ADL's:  Intact  Cognition: WNL  Sleep:  Poor   Screenings: GAD-7    Flowsheet Row Office Visit from 11/22/2020 in [place]  Total GAD-7 Score 11      [place] from 10/18/2020 in [facility], [place] from 10/17/2019 in [facility], [place] from 10/08/2018 in [facility], [place] from 10/05/2017 in [facility], [facility]  Total Score (max 30 points ) 29 30 27 30       PHQ2-9    Flowsheet Row Video Visit from 03/31/2021 in [place] Visit from 01/17/2021 in [facility], [place] from 11/22/2020 in [place] from 10/18/2020 in [facility], [place] from 09/21/2020 in [facility], [facility]  PHQ-2 Total  Score 5 0 4 0 0  PHQ-9 Total Score 16 -- 15 -- --      [place] Visit from 11/22/2020 in [place] Admission (Discharged) from 08/25/2020 in [place] Video Visit from 06/23/2020 in [place] No Risk No Risk Low Risk        Assessment and Plan: KRITHIK MAPEL is a 55 year old Caucasian male, lives in [place], divorced, disabled, has a history of PTSD, MDD, insomnia, diabetes mellitus, history of vision loss, neuropathy was evaluated by telemedicine today.  Patient is currently struggling with depression and sleep problems, will benefit from the following plan.  Plan MDD-unstable Cymbalta 60 mg p.o. daily Increase Wellbutrin XL to 450 mg p.o. daily  Insomnia-unstable Temazepam 30 mg p.o. nightly Patient wants to stay on the temazepam Will increase hydroxyzine to 25-50 mg p.o. nightly as needed Patient with history of sleep apnea-encouraged compliance with CPAP. Sleep study completed 01/05/2021 Patient also with Periodic limb movement disorder.   PTSD-stable Cymbalta 60 mg p.o. daily  Tobacco use disorder-unstable We will monitor closely  Patient will need repeat LFTs, advised to sign a release to obtain most recent labs.  Follow-up in clinic in 3 weeks or sooner if needed.  This note was generated in part or whole with voice recognition software. Voice recognition is  usually quite accurate but there are transcription errors that can and very often do occur. I apologize for any typographical errors that were not detected and corrected.        Ursula Alert, MD 03/31/2021, 2:54 PM

## 2021-04-05 ENCOUNTER — Ambulatory Visit: Payer: Self-pay | Admitting: Student-PharmD

## 2021-04-05 DIAGNOSIS — E782 Mixed hyperlipidemia: Secondary | ICD-10-CM

## 2021-04-05 DIAGNOSIS — E11649 Type 2 diabetes mellitus with hypoglycemia without coma: Secondary | ICD-10-CM

## 2021-04-05 NOTE — Progress Notes (Signed)
Diabetes (DM) Review Call   Burel,Bertha  [id number] 15 years, Male  DOB: Nov 12, 1965  M: [phone]  Diabetes (DM) Review  Completed by Charlann Lange on 04/05/2021  Chart Review Is the patient enrolled in RPM with the glucometer?: No A1C Reading #1 (last): 8.9 on: 01/16/2021 A1C Reading #2: 8.7 on: 10/17/2020 When was patient's last eye exam?: 12/03/2019 The patient's glycemic control has: Remained Stable What recent interventions/DTPs have been made by any  provider to improve the patient's conditions in the last 3 months?: 03/31/2021 Video Visit [place] Ursula Alert, MD. For major depressive disorder. No other information given. Any recent hospitalizations or ED visits since last visit with CPP?: No Is the patient currently on a STATIN medication?: Yes Is the patient currently on ACE/ARB medication?: No Most recent Microalbumin / Creatinine ratio (MACR): 177.1 on: 10/07/2018  Adherence Review Adherence rates for STAR metric medications: Farxiga 10 mg 90 DS 03/06/21, 12/07/20 Atorvastatin 40 mg 90 DS 03/12/21, 12/14/20 Adherence rates for medications indicated for disease state being reviewed: Farxiga 10 mg 90 DS 03/06/21, 12/07/20 Does the patient have >5 day gap between last estimated fill dates for any of the above medications?: No  Disease State Questions Able to connect with the Patient?: Yes Are you currently checking your blood sugars?: Yes How often are you checking your blood sugar?: daily Recent fasting blood sugars: 293 Is the patient having any lows <70?: No Is the patient having any fasting blood sugars above >170?: Yes What are the symptoms?: None. Is the patient checking their feet daily/regularly?: Yes Are there any cuts, swelling, blisters, redness, or any signs of infections?: No What diet changes have you made to improve your Blood Sugar level?: eating more home-cooked meals, limiting processed foods What exercise are you doing to  improve your Blood Sugar level?: no formal exercise Misc. Response/Information:: Patient stated he is drinking sweet tea, I explained to him ways to help level out his blood sugars, he voiced understanding.   Pharmacist Review  Adherence gaps identified?: No Drug Therapy Problems identified?: No Assessment: Uncontrolled Plan: Patient has f/u appt with PCP on 12/28 for A1C Recheck and f/u CCM visit on 1/4  9 minutes spent in review, coordination, and documentation.  Reviewed by: Alena Bills PharmD Clinical Pharmacist [phone]

## 2021-04-20 ENCOUNTER — Other Ambulatory Visit: Payer: Self-pay

## 2021-04-20 ENCOUNTER — Encounter: Payer: Self-pay | Admitting: Nurse Practitioner

## 2021-04-20 ENCOUNTER — Ambulatory Visit (INDEPENDENT_AMBULATORY_CARE_PROVIDER_SITE_OTHER): Payer: Medicare Other | Admitting: Nurse Practitioner

## 2021-04-20 VITALS — BP 114/77 | HR 100 | Temp 97.8°F | Resp 16 | Ht 69.0 in | Wt 228.0 lb

## 2021-04-20 DIAGNOSIS — I10 Essential (primary) hypertension: Secondary | ICD-10-CM

## 2021-04-20 DIAGNOSIS — I700 Atherosclerosis of aorta: Secondary | ICD-10-CM

## 2021-04-20 DIAGNOSIS — E1165 Type 2 diabetes mellitus with hyperglycemia: Secondary | ICD-10-CM

## 2021-04-20 LAB — POCT GLYCOSYLATED HEMOGLOBIN (HGB A1C): Hemoglobin A1C: 7.7 % — AB (ref 4.0–5.6)

## 2021-04-20 NOTE — Progress Notes (Signed)
[facility] [place], [place] [address]  Internal MEDICINE  Office Visit Note  Patient Name: Raymond [name]  [id number]  [id number]  Date of Service: 04/20/2021  Chief Complaint  Patient presents with   Follow-up   Diabetes   Depression   Hyperlipidemia   Insect Bite    HPI Raymond [name] presents for a follow up visit for diabetes and A1C check. His A1C is 7.7 today which is a significant improvement from 8.9 in September. He is having difficulty getting his trulicity shipped to him due to backorder. Will give samples x8 weeks since we have extra of the 3 mg dose. His blood pressure is well controlled.    Current Medication: Outpatient Encounter Medications as of 04/20/2021  Medication Sig   ACCU-CHEK FASTCLIX LANCETS MISC yse as directed twice a day   albuterol (PROAIR HFA) 108 (90 Base) MCG/ACT inhaler Inhale 2-4 puffs by mouth Every 4-6 hours as needed for wheezing, cough, and/or shortness of breath   aspirin EC 81 MG tablet Take 81 mg by mouth daily at 3 pm.    atorvastatin (LIPITOR) 40 MG tablet Take 1 tablet (40 mg total) by mouth daily.   Budeson-Glycopyrrol-Formoterol (BREZTRI AEROSPHERE) 160-9-4.8 MCG/ACT AERO Inhale 2 puffs into the lungs 2 (two) times daily.   buPROPion (WELLBUTRIN XL) 150 MG 24 hr tablet Take 1 tablet (150 mg total) by mouth daily. Take along with 300 mg daily   buPROPion (WELLBUTRIN XL) 300 MG 24 hr tablet Take 1 tablet (300 mg total) by mouth daily.   dapagliflozin propanediol (FARXIGA) 10 MG TABS tablet Take 1 tablet (10 mg total) by mouth daily before breakfast.   Dulaglutide 3 MG/0.5ML SOPN Inject 3 mg into the skin once a week.   DULoxetine (CYMBALTA) 60 MG capsule Take 1 capsule (60 mg total) by mouth daily.   famotidine (PEPCID) 20 MG tablet Take 1 tablet (20 mg total) by mouth 2 (two) times daily.   glucose blood (ACCU-CHEK AVIVA PLUS) test strip Blood sugar testing TID and as needed. E11.65   hydrOXYzine (VISTARIL) 25  MG capsule Take 1-2 capsules (25-50 mg total) by mouth at bedtime as needed.   insulin glargine, 2 Unit Dial, (TOUJEO MAX SOLOSTAR) 300 UNIT/ML Solostar Pen Inject 50 Units into the skin daily. May decrease to 40 units at bedtime if having episodes of hypoglycemia.   Insulin Pen Needle 32G X 4 MM MISC Use with daily dose insulin and with sliding scale insulin as needed.   MELATONIN PO Take 10 mg by mouth daily.   metoprolol tartrate (LOPRESSOR) 100 MG tablet Take 1 tablet (100 mg total) by mouth 2 (two) times daily.   nitroGLYCERIN (NITROSTAT) 0.4 MG SL tablet Place 1 tablet (0.4 mg total) under the tongue every 5 (five) minutes x 3 doses as needed for chest pain.   pantoprazole (PROTONIX) 40 MG tablet Take 1 tablet (40 mg total) by mouth daily.   polyethylene glycol (MIRALAX) 17 g packet Take 17 g by mouth daily.   temazepam (RESTORIL) 30 MG capsule Take 1 capsule (30 mg total) by mouth at bedtime as needed for sleep.   [DISCONTINUED] ezetimibe (ZETIA) 10 MG tablet Take 1 tablet (10 mg total) by mouth daily.   colestipol (COLESTID) 1 g tablet Take 2 tablets (2 g total) by mouth daily for 30 doses.   No facility-administered encounter medications on file as of 04/20/2021.    Surgical History: Past Surgical History:  Procedure Laterality Date  ingrown toenail removal     CHOLECYSTECTOMY N/A 01/23/2019   Procedure: LAPAROSCOPIC CHOLECYSTECTOMY;  Surgeon: Olean Ree, MD;  Location: [facility] ORS;  Service: General;  Laterality: N/A;   CLAVICLE SURGERY Right    COLONOSCOPY  2017   COLONOSCOPY WITH PROPOFOL N/A 08/25/2020   Procedure: COLONOSCOPY WITH PROPOFOL;  Surgeon: Virgel Manifold, MD;  Location: [facility] ENDOSCOPY;  Service: Endoscopy;  Laterality: N/A;   ESOPHAGOGASTRODUODENOSCOPY (EGD) WITH PROPOFOL N/A 08/25/2020   Procedure: ESOPHAGOGASTRODUODENOSCOPY (EGD) WITH PROPOFOL;  Surgeon: Virgel Manifold, MD;  Location: [facility] ENDOSCOPY;  Service: Endoscopy;  Laterality: N/A;   MOUTH SURGERY      REMOVED ALL TEETH   SHOULDER SURGERY Right    UPPER GI ENDOSCOPY  2017    Medical History: Past Medical History:  Diagnosis Date   Anxiety    Arthritis    NECK   Bronchitis    Coronary artery disease    Depression    Diabetes mellitus without complication ([place])    Diastasis of rectus abdominis 06/19/2018   Dyspnea    DUE TO GALLBLADDER PER PT   Emphysema lung (HCC)    Family history of adverse reaction to anesthesia    N/V, TROUBLE BREATHING COMING OUT OF ANESTHESIA   Fatty liver    GERD (gastroesophageal reflux disease)    Headache    MIGRAINES   Hyperlipidemia    Hypertension    Irregular heart beat unk   Myocardial infarction ([place]) 2010   MILD    PTSD (post-traumatic stress disorder)    Sleep apnea    USES CPAP   Tachycardia     Family History: Family History  Problem Relation Age of Onset   Hypertension Mother    Diabetes type II Mother    Diabetes Mother    COPD Father    Cancer Father    Heart disease Father    Diabetes Sister    Anxiety disorder Sister    Depression Sister    Diabetes Brother    Anxiety disorder Brother    Depression Brother    Diabetes Maternal Aunt    Diabetes Sister    Anxiety disorder Sister    Depression Sister    Diabetes Sister    Anxiety disorder Sister    Depression Sister    Diabetes Sister    Anxiety disorder Sister    Depression Sister    Colon cancer Maternal Uncle     Social History   Socioeconomic History   Marital status: Divorced    Spouse name: Not on file   Number of children: 4   Years of education: Not on file   Highest education level: 8th grade  Occupational History   Not on file  Tobacco Use   Smoking status: Every Day    Packs/day: 1.50    Years: 40.00    Pack years: 60.00    Types: Cigarettes   Smokeless tobacco: Former    Types: Chew   Tobacco comments:    1 pack per day reported 01/03/2021  Vaping Use   Vaping Use: Former  Substance and Sexual Activity   Alcohol use: No     Alcohol/week: 0.0 standard drinks   Drug use: No   Sexual activity: Not Currently  Other Topics Concern   Not on file  Social History Narrative   Not on file   Social Determinants of Health   Financial Resource Strain: Low Risk    Difficulty of Paying Living Expenses: Not very hard  Food  Insecurity: Not on file  Transportation Needs: Not on file  Physical Activity: Not on file  Stress: Not on file  Social Connections: Not on file  Intimate Partner Violence: Not on file      Review of Systems  Constitutional:  Negative for chills, fatigue and unexpected weight change.  HENT:  Negative for congestion, rhinorrhea, sneezing and sore throat.   Eyes:  Negative for redness.  Respiratory:  Negative for cough, chest tightness and shortness of breath.   Cardiovascular:  Negative for chest pain and palpitations.  Gastrointestinal:  Negative for abdominal pain, constipation, diarrhea, nausea and vomiting.  Genitourinary:  Negative for dysuria and frequency.  Musculoskeletal:  Negative for arthralgias, back pain, joint swelling and neck pain.  Skin:  Negative for rash.  Neurological: Negative.  Negative for tremors and numbness.  Hematological:  Negative for adenopathy. Does not bruise/bleed easily.  Psychiatric/Behavioral:  Negative for behavioral problems (Depression), sleep disturbance and suicidal ideas. The patient is not nervous/anxious.    Vital Signs: BP 114/77    Pulse 100    Temp 97.8 F (36.6 C)    Resp 16    Ht 5\' 9"  (1.753 m)    Wt 228 lb (103.4 kg)    SpO2 98%    BMI 33.67 kg/m    Physical Exam Vitals reviewed.  Constitutional:      General: He is not in acute distress.    Appearance: Normal appearance. He is obese. He is not ill-appearing.  HENT:     Head: Normocephalic and atraumatic.  Eyes:     Pupils: Pupils are equal, round, and reactive to light.  Cardiovascular:     Rate and Rhythm: Normal rate and regular rhythm.  Pulmonary:     Effort: Pulmonary effort  is normal. No respiratory distress.  Neurological:     Mental Status: He is alert and oriented to person, place, and time.     Cranial Nerves: No cranial nerve deficit.     Coordination: Coordination normal.     Gait: Gait normal.  Psychiatric:        Mood and Affect: Mood normal.        Behavior: Behavior normal.       Assessment/Plan: 1. Uncontrolled type 2 diabetes mellitus with hyperglycemia (HCC) A1C has shown significant improvement at 7.7 today. Samples of 3 mg trulicity provided. Follow up in 3 months with repeat A1C. - POCT HgB A1C - Microalbumin, urine  2. Essential hypertension Stable, well controlled with current medication, continue as prescribed.   3. Aortic atherosclerosis ([place]) Patient is taking atorvastatin 40 mg daily, ezetimibe 10 mg daily and aspirin 81 mg daily.  Continue as prescribed.    General Counseling: Ayomikun verbalizes understanding of the findings of todays visit and agrees with plan of treatment. I have discussed any further diagnostic evaluation that may be needed or ordered today. We also reviewed his medications today. he has been encouraged to call the office with any questions or concerns that should arise related to todays visit.    Orders Placed This Encounter  Procedures   Microalbumin, urine   POCT HgB A1C    No orders of the defined types were placed in this encounter.   Return in about 3 months (around 07/19/2021) for F/U, Recheck A1C, Mayleigh Tetrault PCP.   Total time spent:30 Minutes Time spent includes review of chart, medications, test results, and follow up plan with the patient.   [place] Controlled Substance Database was reviewed by me.  This  patient was seen by Jonetta Osgood, FNP-C in collaboration with Dr. Clayborn Bigness as a part of collaborative care agreement.   Abass Misener R. Valetta Fuller, MSN, FNP-C Internal medicine

## 2021-04-21 LAB — MICROALBUMIN, URINE: Microalbumin, Urine: 31.1 ug/mL

## 2021-04-22 ENCOUNTER — Emergency Department
Admission: EM | Admit: 2021-04-22 | Discharge: 2021-04-22 | Disposition: A | Discharge status: Home / Self Care | Payer: Medicare Other | Attending: Emergency Medicine | Admitting: Emergency Medicine

## 2021-04-22 ENCOUNTER — Other Ambulatory Visit: Payer: Self-pay

## 2021-04-22 ENCOUNTER — Encounter: Payer: Self-pay | Admitting: Intensive Care

## 2021-04-22 ENCOUNTER — Telehealth (INDEPENDENT_AMBULATORY_CARE_PROVIDER_SITE_OTHER): Payer: Medicare Other | Admitting: Psychiatry

## 2021-04-22 ENCOUNTER — Encounter: Payer: Self-pay | Admitting: Psychiatry

## 2021-04-22 ENCOUNTER — Emergency Department: Payer: Medicare Other

## 2021-04-22 DIAGNOSIS — S93601A Unspecified sprain of right foot, initial encounter: Secondary | ICD-10-CM | POA: Insufficient documentation

## 2021-04-22 DIAGNOSIS — M7731 Calcaneal spur, right foot: Secondary | ICD-10-CM | POA: Diagnosis not present

## 2021-04-22 DIAGNOSIS — F17210 Nicotine dependence, cigarettes, uncomplicated: Secondary | ICD-10-CM | POA: Diagnosis not present

## 2021-04-22 DIAGNOSIS — W2209XA Striking against other stationary object, initial encounter: Secondary | ICD-10-CM | POA: Diagnosis not present

## 2021-04-22 DIAGNOSIS — I2510 Atherosclerotic heart disease of native coronary artery without angina pectoris: Secondary | ICD-10-CM | POA: Insufficient documentation

## 2021-04-22 DIAGNOSIS — F17200 Nicotine dependence, unspecified, uncomplicated: Secondary | ICD-10-CM

## 2021-04-22 DIAGNOSIS — E119 Type 2 diabetes mellitus without complications: Secondary | ICD-10-CM | POA: Insufficient documentation

## 2021-04-22 DIAGNOSIS — F4310 Post-traumatic stress disorder, unspecified: Secondary | ICD-10-CM

## 2021-04-22 DIAGNOSIS — F3341 Major depressive disorder, recurrent, in partial remission: Secondary | ICD-10-CM | POA: Diagnosis not present

## 2021-04-22 DIAGNOSIS — K219 Gastro-esophageal reflux disease without esophagitis: Secondary | ICD-10-CM | POA: Diagnosis not present

## 2021-04-22 DIAGNOSIS — Z7982 Long term (current) use of aspirin: Secondary | ICD-10-CM | POA: Diagnosis not present

## 2021-04-22 DIAGNOSIS — Z794 Long term (current) use of insulin: Secondary | ICD-10-CM | POA: Diagnosis not present

## 2021-04-22 DIAGNOSIS — Z79899 Other long term (current) drug therapy: Secondary | ICD-10-CM | POA: Diagnosis not present

## 2021-04-22 DIAGNOSIS — I10 Essential (primary) hypertension: Secondary | ICD-10-CM | POA: Insufficient documentation

## 2021-04-22 DIAGNOSIS — S99921A Unspecified injury of right foot, initial encounter: Secondary | ICD-10-CM | POA: Diagnosis not present

## 2021-04-22 DIAGNOSIS — R7989 Other specified abnormal findings of blood chemistry: Secondary | ICD-10-CM

## 2021-04-22 DIAGNOSIS — G4701 Insomnia due to medical condition: Secondary | ICD-10-CM | POA: Diagnosis not present

## 2021-04-22 NOTE — ED Provider Notes (Signed)
[facility] Emergency Department Provider Note  ____________________________________________   Event Date/Time   First MD Initiated Contact with Patient 04/22/21 1246     (approximate)  I have reviewed the triage vital signs and the nursing notes.   HISTORY  Chief Complaint Foot Injury    HPI Raymond [name] is a 55 y.o. male presents emergency department complaining of right foot pain.  Patient states that he has 1 dog that is and he and the other was trying to get past him when he clipped his leg causing his foot to hit the wall and bend his toes backwards.  Having pain and swelling since then.  Happened last night.  No numbness or tingling.  Past Medical History:  Diagnosis Date   Anxiety    Arthritis    NECK   Bronchitis    Coronary artery disease    Depression    Diabetes mellitus without complication ([place])    Diastasis of rectus abdominis 06/19/2018   Dyspnea    DUE TO GALLBLADDER PER PT   Emphysema lung ([place])    Family history of adverse reaction to anesthesia    N/V, TROUBLE BREATHING COMING OUT OF ANESTHESIA   Fatty liver    GERD (gastroesophageal reflux disease)    Headache    MIGRAINES   Hyperlipidemia    Hypertension    Irregular heart beat unk   Myocardial infarction ([place]) 2010   MILD    PTSD (post-traumatic stress disorder)    Sleep apnea    USES CPAP   Tachycardia     Patient Active Problem List   Diagnosis Date Noted   Abnormal LFTs 04/22/2021   Insomnia due to medical condition 12/13/2020   At risk for prolonged QT interval syndrome 12/13/2020   Hypersomnia with sleep apnea 12/13/2020   Mixed hyperlipidemia 09/26/2020   Pulmonary emphysema ([place]) 09/26/2020   Barrett's esophagus without dysplasia    Gastric erythema    Special screening for malignant neoplasms, colon    Polyp of colon    MDD (major depressive disorder), recurrent episode, mild ([place]) 06/23/2020   Noncompliance with diabetes treatment 04/12/2020    Angina pectoris ([place]) 04/12/2020   MDD (major depressive disorder), recurrent, in full remission ([place]) 01/05/2020   MDD (major depressive disorder), recurrent, in partial remission ([place]) 10/30/2019   Tobacco use disorder 07/02/2019   Right upper quadrant pain    Encounter for other preprocedural examination 01/20/2019   PTSD (post-traumatic stress disorder) 01/20/2019   MDD (major depressive disorder), recurrent episode, moderate ([place]) 01/20/2019   Insomnia due to mental disorder 01/20/2019   Calculus of gallbladder with acute on chronic cholecystitis without obstruction 11/15/2018   Generalized abdominal pain 10/08/2018   Local superficial swelling, mass or lump 10/08/2018   Enterocolitis 08/16/2018   Left upper quadrant abdominal pain of unknown etiology 08/16/2018   Diastasis recti 06/19/2018   Generalized anxiety disorder 04/01/2018   Clotted blood in stool 12/31/2017   Diarrhea 12/31/2017   Encounter for general adult medical examination with abnormal findings 10/27/2017   Uncontrolled type 2 diabetes mellitus with hyperglycemia ([place]) 10/27/2017   Onychomycosis with ingrown toenail 10/27/2017   Dysuria 10/27/2017   Uncontrolled type 2 diabetes mellitus with hypoglycemia without coma ([place]) 07/08/2017   Shortness of breath 07/08/2017   Cigarette nicotine dependence without complication 38/88/2800   Influenza A 05/30/2017   OSA (obstructive sleep apnea) 08/12/2015   Cervical spondylosis 06/14/2015   Essential hypertension 04/08/2015   Depression 04/08/2015  Gastroesophageal reflux disease without esophagitis 01/05/2015   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    Past Surgical History:  Procedure Laterality Date    ingrown toenail removal     CHOLECYSTECTOMY N/A 01/23/2019   Procedure: LAPAROSCOPIC CHOLECYSTECTOMY;  Surgeon: Olean Ree, MD;  Location: [facility] ORS;  Service: General;  Laterality: N/A;   CLAVICLE SURGERY Right    COLONOSCOPY  2017    COLONOSCOPY WITH PROPOFOL N/A 08/25/2020   Procedure: COLONOSCOPY WITH PROPOFOL;  Surgeon: Virgel Manifold, MD;  Location: [facility] ENDOSCOPY;  Service: Endoscopy;  Laterality: N/A;   ESOPHAGOGASTRODUODENOSCOPY (EGD) WITH PROPOFOL N/A 08/25/2020   Procedure: ESOPHAGOGASTRODUODENOSCOPY (EGD) WITH PROPOFOL;  Surgeon: Virgel Manifold, MD;  Location: [facility] ENDOSCOPY;  Service: Endoscopy;  Laterality: N/A;   MOUTH SURGERY     REMOVED ALL TEETH   SHOULDER SURGERY Right    UPPER GI ENDOSCOPY  2017    Prior to Admission medications   Medication Sig Start Date End Date Taking? Authorizing Provider  ACCU-CHEK FASTCLIX LANCETS MISC yse as directed twice a day 05/07/17   Ronnell Freshwater, NP  albuterol (PROAIR HFA) 108 (90 Base) MCG/ACT inhaler Inhale 2-4 puffs by mouth Every 4-6 hours as needed for wheezing, cough, and/or shortness of breath 07/31/18   Ronnell Freshwater, NP  aspirin EC 81 MG tablet Take 81 mg by mouth daily at 3 pm.  01/12/16   [provider]  atorvastatin (LIPITOR) 40 MG tablet Take 1 tablet (40 mg total) by mouth daily. 09/21/20   Jonetta Osgood, NP  Budeson-Glycopyrrol-Formoterol (BREZTRI AEROSPHERE) 160-9-4.8 MCG/ACT AERO Inhale 2 puffs into the lungs 2 (two) times daily. 09/21/20   Jonetta Osgood, NP  buPROPion (WELLBUTRIN XL) 150 MG 24 hr tablet Take 1 tablet (150 mg total) by mouth daily. Take along with 300 mg daily 03/31/21   Ursula Alert, MD  buPROPion (WELLBUTRIN XL) 300 MG 24 hr tablet Take 1 tablet (300 mg total) by mouth daily. 03/31/21   Ursula Alert, MD  colestipol (COLESTID) 1 g tablet Take 2 tablets (2 g total) by mouth daily for 30 doses. 08/09/20 09/08/20  Virgel Manifold, MD  dapagliflozin propanediol (FARXIGA) 10 MG TABS tablet Take 1 tablet (10 mg total) by mouth daily before breakfast. 01/17/21   Jonetta Osgood, NP  Dulaglutide 3 MG/0.5ML SOPN Inject 3 mg into the skin once a week. 01/17/21   Jonetta Osgood, NP  DULoxetine (CYMBALTA) 60 MG  capsule Take 1 capsule (60 mg total) by mouth daily. 11/22/20   Ursula Alert, MD  ezetimibe (ZETIA) 10 MG tablet Take 1 tablet (10 mg total) by mouth daily. 09/21/20   Jonetta Osgood, NP  famotidine (PEPCID) 20 MG tablet Take 1 tablet (20 mg total) by mouth 2 (two) times daily. 10/18/20   Jonetta Osgood, NP  glucose blood (ACCU-CHEK AVIVA PLUS) test strip Blood sugar testing TID and as needed. E11.65 01/16/20   Ronnell Freshwater, NP  hydrOXYzine (VISTARIL) 25 MG capsule Take 1-2 capsules (25-50 mg total) by mouth at bedtime as needed. 03/31/21   Ursula Alert, MD  insulin glargine, 2 Unit Dial, (TOUJEO MAX SOLOSTAR) 300 UNIT/ML Solostar Pen Inject 50 Units into the skin daily. May decrease to 40 units at bedtime if having episodes of hypoglycemia. 09/21/20   Jonetta Osgood, NP  Insulin Pen Needle 32G X 4 MM MISC Use with daily dose insulin and with sliding scale insulin as needed. 01/16/20   Ronnell Freshwater, NP  MELATONIN PO Take 10 mg  by mouth daily.    [provider]  metoprolol tartrate (LOPRESSOR) 100 MG tablet Take 1 tablet (100 mg total) by mouth 2 (two) times daily. 01/17/21   Jonetta Osgood, NP  nitroGLYCERIN (NITROSTAT) 0.4 MG SL tablet Place 1 tablet (0.4 mg total) under the tongue every 5 (five) minutes x 3 doses as needed for chest pain. 04/12/20   Ronnell Freshwater, NP  pantoprazole (PROTONIX) 40 MG tablet Take 1 tablet (40 mg total) by mouth daily. 01/17/21   Jonetta Osgood, NP  polyethylene glycol (MIRALAX) 17 g packet Take 17 g by mouth daily. 10/18/20   Jonetta Osgood, NP  temazepam (RESTORIL) 30 MG capsule Take 1 capsule (30 mg total) by mouth at bedtime as needed for sleep. 03/31/21   Ursula Alert, MD    Allergies Onion, Hydrocodone, Nicoderm [nicotine], Norco [hydrocodone-acetaminophen], Hydrocodone-acetaminophen, and Nickel  Family History  Problem Relation Age of Onset   Hypertension Mother    Diabetes type II Mother    Diabetes Mother    COPD  Father    Cancer Father    Heart disease Father    Diabetes Sister    Anxiety disorder Sister    Depression Sister    Diabetes Brother    Anxiety disorder Brother    Depression Brother    Diabetes Maternal Aunt    Diabetes Sister    Anxiety disorder Sister    Depression Sister    Diabetes Sister    Anxiety disorder Sister    Depression Sister    Diabetes Sister    Anxiety disorder Sister    Depression Sister    Colon cancer Maternal Uncle     Social History Social History   Tobacco Use   Smoking status: Every Day    Packs/day: 1.50    Years: 40.00    Pack years: 60.00    Types: Cigarettes   Smokeless tobacco: Former    Types: Chew   Tobacco comments:    1 pack per day reported 01/03/2021  Vaping Use   Vaping Use: Former  Substance Use Topics   Alcohol use: No    Alcohol/week: 0.0 standard drinks   Drug use: No    Review of Systems  Constitutional: No fever/chills Eyes: No visual changes. ENT: No sore throat. Respiratory: Denies cough Cardiovascular: Denies chest pain Gastrointestinal: Denies abdominal pain Genitourinary: Negative for dysuria. Musculoskeletal: Negative for back pain.  Positive for right foot pain Skin: Negative for rash. Psychiatric: no mood changes,     ____________________________________________   PHYSICAL EXAM:  VITAL SIGNS: ED Triage Vitals [04/22/21 1141]  Enc Vitals Group     BP (!) 138/95     Pulse Rate (!) 128     Resp 18     Temp 98.3 F (36.8 C)     Temp Source Oral     SpO2 95 %     Weight 228 lb (103.4 kg)     Height 5\' 9"  (1.753 m)     Head Circumference      Peak Flow      Pain Score 10     Pain Loc      Pain Edu?      Excl. in [place]?     Constitutional: Alert and oriented. Well appearing and in no acute distress. Eyes: Conjunctivae are normal.  Head: Atraumatic. Nose: No congestion/rhinnorhea. Mouth/Throat: Mucous membranes are moist.   Neck:  supple no lymphadenopathy noted Cardiovascular: Normal  rate, regular rhythm.  Respiratory: Normal respiratory effort.  No retractions, l GU: deferred Musculoskeletal: FROM all extremities, warm and well perfused, patient is able to wiggle his toes, swelling noted to the distal area of the right foot, tender along the distal portion of the metatarsals.  Neurovascular is intact. Neurologic:  Normal speech and language.  Skin:  Skin is warm, dry and intact. No rash noted. Psychiatric: Mood and affect are normal. Speech and behavior are normal.  ____________________________________________   LABS (all labs ordered are listed, but only abnormal results are displayed)  Labs Reviewed - No data to display ____________________________________________   ____________________________________________  RADIOLOGY  X-ray of the right foot  ____________________________________________   PROCEDURES  Procedure(s) performed: Postop shoe applied by nursing staff   Procedures    ____________________________________________   INITIAL IMPRESSION / ASSESSMENT AND PLAN / ED COURSE  Pertinent labs & imaging results that were available during my care of the patient were reviewed by me and considered in my medical decision making (see chart for details).   The patient is a 55 year old male presents emergency department with complaints of right foot pain.  See HPI.  Physical exam shows the foot to be swollen and tender.  X-ray of the right foot reviewed by me and confirmed by radiology to be negative for any acute abnormalities  I did explain findings to the patient.  Feel he should follow-up with podiatry.  He is placed in a postop shoe to calm the tendons.  He is to elevate and apply ice.  Take Tylenol or ibuprofen for pain as needed.  Patient is in agreement with treatment plan.  He was discharged stable condition.     Raymond [name] was evaluated in Emergency Department on 04/22/2021 for the symptoms described in the history of present illness.  He was evaluated in the context of the global COVID-19 pandemic, which necessitated consideration that the patient might be at risk for infection with the SARS-CoV-2 virus that causes COVID-19. Institutional protocols and algorithms that pertain to the evaluation of patients at risk for COVID-19 are in a state of rapid change based on information released by regulatory bodies including the CDC and federal and state organizations. These policies and algorithms were followed during the patient's care in the ED.    As part of my medical decision making, I reviewed the following data within the [facility] notes reviewed and incorporated, Old chart reviewed, Radiograph reviewed , Notes from prior ED visits, and [place] Controlled Substance Database  ____________________________________________   FINAL CLINICAL IMPRESSION(S) / ED DIAGNOSES  Final diagnoses:  Sprain of right foot, initial encounter      NEW MEDICATIONS STARTED DURING THIS VISIT:  New Prescriptions   No medications on file     Note:  This document was prepared using Dragon voice recognition software and may include unintentional dictation errors.    Versie Starks, PA-C 04/22/21 1259    Harvest Dark, MD 04/22/21 1505

## 2021-04-22 NOTE — ED Provider Notes (Signed)
Emergency Medicine Provider Triage Evaluation Note  Raymond [name] , a 56 y.o. male  was evaluated in triage.  Pt complains of right foot pain after injury last night. His dog pinned his foot against the wall. Pain in top and bottom of foot around 3-5 toes.  Review of Systems  Positive: Foot pain Negative:        Ankle pain  Physical Exam  There were no vitals taken for this visit. Gen:   Awake, no distress   Resp:  Normal effort  MSK:   Moves extremities without difficulty  Other:    Medical Decision Making  Medically screening exam initiated at 11:39 AM.  Appropriate orders placed.  [name] was informed that the remainder of the evaluation will be completed by another provider, this initial triage assessment does not replace that evaluation, and the importance of remaining in the ED until their evaluation is complete.    Victorino Dike, FNP 04/22/21 1143    Harvest Dark, MD 04/22/21 1204

## 2021-04-22 NOTE — Progress Notes (Signed)
Virtual Visit via Video Note  I connected with Raymond [name] on 04/22/21 at 10:00 AM EST by a video enabled telemedicine application and verified that I am speaking with the correct person using two identifiers.  Location Provider Location : [facility] Patient Location : Home  Participants: Patient , Provider    I discussed the limitations of evaluation and management by telemedicine and the availability of in person appointments. The patient expressed understanding and agreed to proceed.   I discussed the assessment and treatment plan with the patient. The patient was provided an opportunity to ask questions and all were answered. The patient agreed with the plan and demonstrated an understanding of the instructions.   The patient was advised to call back or seek an in-person evaluation if the symptoms worsen or if the condition fails to improve as anticipated.   Waverly Hall MD OP Progress Note  04/22/2021 12:59 PM ELAINE MIDDLETON  MRN:  [id number]  Chief Complaint:  Chief Complaint   Follow-up; Anxiety; Insomnia    HPI: Raymond [name] is a 55 year old male, divorced, has a history of MDD, PTSD, tobacco use disorder, uncontrolled diabetes, hyperlipidemia, hepatic steatosis, lives in [place] was evaluated by telemedicine today.  Patient today reports his mood symptoms as improving since the past few weeks.  He reports his sadness, appetite has improved.  He does feel tired however he does have multiple health problems which could also be contributing to the same.  He reports the current medication regimen as beneficial.  Denies side effects.  Patient reports sleep as more solid since the past few weeks.  He is tolerating the temazepam and the hydroxyzine well.  Had a good Christmas with his children and was able to see his grandson after a long time which helped him to feel better.    Patient reports he is trying to quit smoking, is interested in trying the lozenges or patches of  nicotine.  Did not tolerate the taste of lozenges previously.  However would like to try again.  Patient denies any suicidality, homicidality or perceptual disturbances.  Patient denies any other concerns today.  Visit Diagnosis:    ICD-10-CM   1. MDD (major depressive disorder), recurrent, in partial remission ([place])  F33.41     2. PTSD (post-traumatic stress disorder)  F43.10     3. Tobacco use disorder  F17.200     4. Insomnia due to medical condition  G47.01    OSA,mood    5. Abnormal LFTs  R79.89 Hepatic function panel    6. High risk medication use  Z79.899 Hepatic function panel    Basic metabolic panel      Past Psychiatric History: Reviewed past psychiatric history from progress note on 05/23/2018.  Past trials of Tegretol, doxepin, Klonopin, Seroquel, mirtazapine, Belsomra, Lunesta, Ativan, Rozerem, Lexapro.  Past Medical History:  Past Medical History:  Diagnosis Date   Anxiety    Arthritis    NECK   Bronchitis    Coronary artery disease    Depression    Diabetes mellitus without complication (HCC)    Diastasis of rectus abdominis 06/19/2018   Dyspnea    DUE TO GALLBLADDER PER PT   Emphysema lung (HCC)    Family history of adverse reaction to anesthesia    N/V, TROUBLE BREATHING COMING OUT OF ANESTHESIA   Fatty liver    GERD (gastroesophageal reflux disease)    Headache    MIGRAINES   Hyperlipidemia    Hypertension    Irregular  heart beat unk   Myocardial infarction ([place]) 2010   MILD    PTSD (post-traumatic stress disorder)    Sleep apnea    USES CPAP   Tachycardia     Past Surgical History:  Procedure Laterality Date    ingrown toenail removal     CHOLECYSTECTOMY N/A 01/23/2019   Procedure: LAPAROSCOPIC CHOLECYSTECTOMY;  Surgeon: Olean Ree, MD;  Location: [facility] ORS;  Service: General;  Laterality: N/A;   CLAVICLE SURGERY Right    COLONOSCOPY  2017   COLONOSCOPY WITH PROPOFOL N/A 08/25/2020   Procedure: COLONOSCOPY WITH PROPOFOL;  Surgeon:  Virgel Manifold, MD;  Location: [facility] END[facility]COPY;  Service: Endoscopy;  Laterality: N/A;   ESOPHAGOGASTRODUODENOSCOPY (EGD) WITH PROPOFOL N/A 08/25/2020   Procedure: ESOPHAGOGASTRODUODENOSCOPY (EGD) WITH PROPOFOL;  Surgeon: Virgel Manifold, MD;  Location: [facility] END[facility]COPY;  Service: Endoscopy;  Laterality: N/A;   MOUTH SURGERY     REMOVED ALL TEETH   SHOULDER SURGERY Right    UPPER GI ENDOSCOPY  2017    Family Psychiatric History: Reviewed family psychiatric history from progress note on 05/23/2018  Family History:  Family History  Problem Relation Age of Onset   Hypertension Mother    Diabetes type II Mother    Diabetes Mother    COPD Father    Cancer Father    Heart disease Father    Diabetes Sister    Anxiety disorder Sister    Depression Sister    Diabetes Brother    Anxiety disorder Brother    Depression Brother    Diabetes Maternal Aunt    Diabetes Sister    Anxiety disorder Sister    Depression Sister    Diabetes Sister    Anxiety disorder Sister    Depression Sister    Diabetes Sister    Anxiety disorder Sister    Depression Sister    Colon cancer Maternal Uncle     Social History: Reviewed social history from progress note on 05/23/2018 Social History   Socioeconomic History   Marital status: Divorced    Spouse name: Not on file   Number of children: 4   Years of education: Not on file   Highest education level: 8th grade  Occupational History   Not on file  Tobacco Use   Smoking status: Every Day    Packs/day: 1.50    Years: 40.00    Pack years: 60.00    Types: Cigarettes   Smokeless tobacco: Former    Types: Chew   Tobacco comments:    1 pack per day reported 01/03/2021  Vaping Use   Vaping Use: Former  Substance and Sexual Activity   Alcohol use: No    Alcohol/week: 0.0 standard drinks   Drug use: No   Sexual activity: Not Currently  Other Topics Concern   Not on file  Social History Narrative   Not on file   Social Determinants of  Health   Financial Resource Strain: Low Risk    Difficulty of Paying Living Expenses: Not very hard  Food Insecurity: Not on file  Transportation Needs: Not on file  Physical Activity: Not on file  Stress: Not on file  Social Connections: Not on file    Allergies:  Allergies  Allergen Reactions   Onion Anaphylaxis   Hydrocodone    Nicoderm [Nicotine]    Norco [Hydrocodone-Acetaminophen] Itching   Hydrocodone-Acetaminophen Itching   Nickel Rash    Metabolic Disorder Labs: Lab Results  Component Value Date   HGBA1C 7.7 (A) 04/20/2021  MPG 294.83 05/31/2017   No results found for: PROLACTIN Lab Results  Component Value Date   CHOL 280 (H) 08/17/2020   TRIG 501 (H) 08/17/2020   HDL 31 (L) 08/17/2020   CHOLHDL 4.8 07/15/2015   VLDL 26 12/09/2012   LDLCALC 153 (H) 08/17/2020   LDLCALC 61 07/15/2015   Lab Results  Component Value Date   TSH 1.890 08/17/2020   TSH 4.120 06/10/2018    Therapeutic Level Labs: No results found for: LITHIUM No results found for: VALPROATE No components found for:  CBMZ  Current Medications: Current Outpatient Medications  Medication Sig Dispense Refill   ACCU-CHEK FASTCLIX LANCETS MISC yse as directed twice a day 100 each 1   albuterol (PROAIR HFA) 108 (90 Base) MCG/ACT inhaler Inhale 2-4 puffs by mouth Every 4-6 hours as needed for wheezing, cough, and/or shortness of breath 3 Inhaler 1   aspirin EC 81 MG tablet Take 81 mg by mouth daily at 3 pm.      atorvastatin (LIPITOR) 40 MG tablet Take 1 tablet (40 mg total) by mouth daily. 90 tablet 3   Budeson-Glycopyrrol-Formoterol (BREZTRI AEROSPHERE) 160-9-4.8 MCG/ACT AERO Inhale 2 puffs into the lungs 2 (two) times daily. 10.7 g 11   buPROPion (WELLBUTRIN XL) 150 MG 24 hr tablet Take 1 tablet (150 mg total) by mouth daily. Take along with 300 mg daily 90 tablet 0   buPROPion (WELLBUTRIN XL) 300 MG 24 hr tablet Take 1 tablet (300 mg total) by mouth daily. 90 tablet 0   colestipol  (COLESTID) 1 g tablet Take 2 tablets (2 g total) by mouth daily for 30 doses. 60 tablet 2   dapagliflozin propanediol (FARXIGA) 10 MG TABS tablet Take 1 tablet (10 mg total) by mouth daily before breakfast. 90 tablet 2   Dulaglutide 3 MG/0.5ML SOPN Inject 3 mg into the skin once a week. 6 mL 2   DULoxetine (CYMBALTA) 60 MG capsule Take 1 capsule (60 mg total) by mouth daily. 90 capsule 0   ezetimibe (ZETIA) 10 MG tablet Take 1 tablet (10 mg total) by mouth daily. 30 tablet 2   famotidine (PEPCID) 20 MG tablet Take 1 tablet (20 mg total) by mouth 2 (two) times daily. 180 tablet 1   glucose blood (ACCU-CHEK AVIVA PLUS) test strip Blood sugar testing TID and as needed. E11.65 100 each 12   hydrOXYzine (VISTARIL) 25 MG capsule Take 1-2 capsules (25-50 mg total) by mouth at bedtime as needed. 60 capsule 1   insulin glargine, 2 Unit Dial, (TOUJEO MAX SOLOSTAR) 300 UNIT/ML Solostar Pen Inject 50 Units into the skin daily. May decrease to 40 units at bedtime if having episodes of hypoglycemia. 10 mL 3   Insulin Pen Needle 32G X 4 MM MISC Use with daily dose insulin and with sliding scale insulin as needed. 100 each 5   MELATONIN PO Take 10 mg by mouth daily.     metoprolol tartrate (LOPRESSOR) 100 MG tablet Take 1 tablet (100 mg total) by mouth 2 (two) times daily. 180 tablet 1   nitroGLYCERIN (NITROSTAT) 0.4 MG SL tablet Place 1 tablet (0.4 mg total) under the tongue every 5 (five) minutes x 3 doses as needed for chest pain. 30 tablet 1   pantoprazole (PROTONIX) 40 MG tablet Take 1 tablet (40 mg total) by mouth daily. 90 tablet 1   polyethylene glycol (MIRALAX) 17 g packet Take 17 g by mouth daily. 30 each 0   temazepam (RESTORIL) 30 MG capsule Take 1 capsule (  30 mg total) by mouth at bedtime as needed for sleep. 30 capsule 1   No current facility-administered medications for this visit.     Musculoskeletal: Strength & Muscle Tone:  UTA Gait & Station:  Seated Patient leans: N/A  Psychiatric  Specialty Exam: Review of Systems  Psychiatric/Behavioral:  Positive for dysphoric mood and sleep disturbance.   All other systems reviewed and are negative.  There were no vitals taken for this visit.There is no height or weight on file to calculate BMI.  General Appearance: Casual  Eye Contact:  Fair  Speech:  Normal Rate  Volume:  Normal  Mood:  Dysphoric impoving  Affect:  Congruent  Thought Process:  Goal Directed and Descriptions of Associations: Intact  Orientation:  Full (Time, Place, and Person)  Thought Content: Logical   Suicidal Thoughts:  No  Homicidal Thoughts:  No  Memory:  Immediate;   Fair Recent;   Fair Remote;   Fair  Judgement:  Fair  Insight:  Fair  Psychomotor Activity:  Normal  Concentration:  Concentration: Fair and Attention Span: Fair  Recall:  [employer] of Knowledge: Fair  Language: Fair  Akathisia:  No  Handed:  Right  AIMS (if indicated): not done  Assets:  Communication Skills Desire for Improvement Housing Social Support  ADL's:  Intact  Cognition: WNL  Sleep:   Restless  improving   Screenings: GAD-7    Flowsheet Row Office Visit from 11/22/2020 in [place]  Total GAD-7 Score 11      [place] from 10/18/2020 in [facility] [place] from 10/17/2019 in [facility] [place] from 10/08/2018 in [facility] [place] from 10/05/2017 in [facility]  Total Score (max 30 points ) 29 30 27 30       PHQ2-9    Flowsheet Row Video Visit from 04/22/2021 in [place] Visit from 04/20/2021 in [facility] Video Visit from 03/31/2021 in [place] Visit from 01/17/2021 in [facility] Office Visit from 11/22/2020 in [place]  PHQ-2 Total Score 1 0 5 0 4   PHQ-9 Total Score 3 -- 16 -- [facility] from 04/22/2021 in [place] Office Visit from 11/22/2020 in [place] Admission (Discharged) from 08/25/2020 in [facility] No Risk No Risk No Risk        Assessment and Plan: EILEEN CROSWELL is a 55 year old Caucasian male who lives in [place], divorced, has a history of MDD, insomnia, diabetes mellitus, history of vision loss, neuropathy was evaluated by telemedicine today.  Patient with  history of depression, sleep problems, with recent increase of Wellbutrin reports improvement in his mood as well as sleep.  Plan as noted below.  Plan MDD-in partial remission Cymbalta 60 mg p.o. daily Wellbutrin XL 450 mg p.o. daily  Insomnia-improving Temazepam 30 mg p.o. nightly Hydroxyzine 25 to 50 mg p.o. nightly as needed Encouraged compliance with CPAP for OSA Sleep study completed 01/05/2021.  Patient also with Periodic limb movement disorder.  PTSD-stable Cymbalta 60 mg p.o. daily  Tobacco use disorder-unstable Provided counseling for 5 minutes. Will monitor closely  Abnormal LFTs-unstable Will order hepatic function panel.  He will go to [facility] lab  High risk medication use-unstable We will order  a BMP, LFT.  Patient will go to [facility] lab.  Follow-up in clinic in 2 -3 months or sooner if needed..  This note was generated in part or whole with voice recognition software. Voice recognition is usually quite accurate but there are transcription errors that can and very often do occur. I apologize for any typographical errors that were not detected and corrected.       Ursula Alert, MD 04/22/2021, 12:59 PM

## 2021-04-22 NOTE — Discharge Instructions (Signed)
Elevate and ice your foot.  Follow-up with podiatry.  Phone number is attached.  Wear the postop shoe for 1 week.  Return emergency department for worsening

## 2021-04-22 NOTE — ED Triage Notes (Signed)
Presents with injury to right foot. Able to ambulate with limp

## 2021-04-22 NOTE — ED Notes (Signed)
See triage note states his dog pinned him against the wall  toes swollen and bruised  on right foot

## 2021-04-26 ENCOUNTER — Other Ambulatory Visit
Admission: RE | Admit: 2021-04-26 | Discharge: 2021-04-26 | Disposition: A | Discharge status: Home / Self Care | Payer: Medicare Other | Attending: Psychiatry | Admitting: Psychiatry

## 2021-04-26 DIAGNOSIS — Z79899 Other long term (current) drug therapy: Secondary | ICD-10-CM | POA: Insufficient documentation

## 2021-04-26 DIAGNOSIS — R7989 Other specified abnormal findings of blood chemistry: Secondary | ICD-10-CM | POA: Diagnosis not present

## 2021-04-26 LAB — HEPATIC FUNCTION PANEL
ALT: 21 U/L (ref 0–44)
AST: 21 U/L (ref 15–41)
Albumin: 4 g/dL (ref 3.5–5.0)
Alkaline Phosphatase: 133 U/L — ABNORMAL HIGH (ref 38–126)
Bilirubin, Direct: <0.1 mg/dL (ref 0.0–0.2)
Total Bilirubin: 0.6 mg/dL (ref 0.3–1.2)
Total Protein: 7.7 g/dL (ref 6.5–8.1)

## 2021-04-26 LAB — BASIC METABOLIC PANEL
Anion gap: 6 (ref 5–15)
BUN: 16 mg/dL (ref 6–20)
CO2: 24 mmol/L (ref 22–32)
Calcium: 9.1 mg/dL (ref 8.9–10.3)
Chloride: 103 mmol/L (ref 98–111)
Creatinine, Ser: 1.05 mg/dL (ref 0.61–1.24)
GFR, Estimated: >60 mL/min (ref >60)
Glucose, Bld: 210 mg/dL — ABNORMAL HIGH (ref 70–99)
Potassium: 4.3 mmol/L (ref 3.5–5.1)
Sodium: 133 mmol/L — ABNORMAL LOW (ref 135–145)

## 2021-04-27 ENCOUNTER — Ambulatory Visit: Payer: Medicare Other | Admitting: Student-PharmD

## 2021-04-27 ENCOUNTER — Other Ambulatory Visit: Payer: Self-pay

## 2021-04-27 DIAGNOSIS — E782 Mixed hyperlipidemia: Secondary | ICD-10-CM

## 2021-04-27 DIAGNOSIS — E11649 Type 2 diabetes mellitus with hypoglycemia without coma: Secondary | ICD-10-CM

## 2021-04-27 DIAGNOSIS — I10 Essential (primary) hypertension: Secondary | ICD-10-CM

## 2021-04-27 MED ORDER — EZETIMIBE 10 MG PO TABS: 10.0000 mg | ORAL_TABLET | Freq: Every day | ORAL | 2 refills | Status: DC

## 2021-04-27 NOTE — Progress Notes (Signed)
Follow Up Pharmacist Visit   Raymond [name], Raymond [name]  [id number] 34 years, Male  DOB: [birth year]  M: [phone]  Summary: Patient would like an updated lipid panel ordered at his next office visit. Will make sure PCP is aware prior to visit. Patient would like to start back using a Freestyle [employer]re CGM monitor and states his insurance told him it would be cheaper this year. Will consult PCP for a prescription order. Patient's microalbumin was >30 at last check (04/20/21). May benefit from low dose ACE/ARB for kidney protection. Discussed diet and exercise extensively with patient and how that would assist in blood sugar and cholesterol control.  Recommendations/Changes made from today's visit: Continue current medication therapy.  Plan: F/U in 4 months   Patient Chart Prep   Chronic Conditions Patient's Chronic Conditions: Hypertension (HTN), Gastroesophageal Reflux Disease (GERD), Diabetes (DM), Anxiety, Depression, Hyperlipidemia/Dyslipidemia (HLD)  Doctor and Hospital Visits Were there PCP Visits since last visit with the Pharmacist?: Yes Visit #1: 04/20/21 Jonetta Osgood, NP. For uncontrolled type 2 diabetes mellitus. No medication changes. Were there Specialist Visits since last visit with the Pharmacist?: Yes Visit #1: 04/22/21 [facility] Ursula Alert, MD. For Major Depressive disorder. No more information given. Was there a Hospital Visit in last 30 days?: Yes Reason for admission: Sprain of right foot Admit Date: 04/22/2021 Discharge Date: 04/22/2021 Location Discharged from: [place] Medications Started at hospital discharge (list medications started): None. Medication Changes at hospital discharge (list medications changed): None. Medications Discontinued at hospital discharge (list medications discontinued): None. Medications that remain the same after Hospital Discharge (list medications continued): All  medications are the same. Were there other Hospital Visits since last visit with the Pharmacist?: No  Medication Information Have there been any medication changes from PCP or Specialist since last visit with the Pharmacist?: No Are there any Medication adherence gaps (beyond 5 days past due)?: No Medication adherence rates for the STAR rating drugs: Farxiga 10 mg 03/06/21 90 DS Atorvastatin 40 mg 03/12/21 90 DS List Patient's current Care Gaps: Need Eye Exam Documented in EHR or by Claim  Pre-Call Questions  Are you able to connect with Patient?: Yes Visit Type: Phone Call May we confirm what is the best phone # for the pharmacist to call you?: [phone] Have you been in the hospital or emergency room recently?: No Do you have any questions or concerns that you want to make sure your pharmacist addresses with your during your appointment?: No What, if any, problems do you have getting your medications from the pharmacy?: None Is there anything else that you would like me to pass along to your pharmacist or PCP?: No Patient reminded to bring medication bottles to visit (not a list of meds) OR have medication bottles pulled out prior to appointment time: Done  Disease Assessments  Subjective Information Current BP: 114/77 Current HR: 100 taken on: 04/20/2021 Weight: 228 BMI: 33.67 Last GFR: 83 taken on: 08/16/2020 Why did the patient present?: CCM F/u visit Factors that may affect medication adherence?: Pill burden Is Patient using [facility]?: No Name and location of Current pharmacy: [facility] Current Rx insurance plan: U[employer] Are meds synced by current pharmacy?: No Are meds delivered by current pharmacy?: No - delivery available but patient prefers to not use Would patient benefit from direct intervention of clinical lead in dispensing process to optimize clinical outcomes?: Yes Are [facility] pharmacy services available where patient lives?: Yes Is patient disadvantaged  to use [facility]?: Yes  Does patient experience delays in picking up medications due to transportation concerns (getting to pharmacy)?: No   Hyperlipidemia/Dyslipidemia (HLD) Last Lipid panel on: 08/16/2020 TC (Goal<200): 280 LDL: 153 HDL (Goal>40): 31 TG (Goal<150): 501 ASCVD 10-year risk?is:: High (>20%) ASCVD Risk Score: 33.7% Assess this condition today?: Yes LDL Goal: <70 Has patient tried and failed any HLD Medications?: No Check present secondary causes (below) that can lead to increased cholesterol levels (multi-choice optional): Beta blockers, Diabetes We discussed: How to reduce cholesterol through diet/weight management and physical activity., How a diet high in plant sterols (fruits/vegetables/nuts/whole grains/legumes) may reduce your cholesterol., Encouraged increasing fiber to a daily intake of 10-25g/day Assessment:: Controlled Drug: Atorvastatin 83m 1 tablet daily Assessment: Appropriate, Effective, Safe, Accessible Drug: Ezetimibe 134m1 tablet daily Assessment: Appropriate, Effective, Safe, Accessible Additional Info: Patient wants an updated lipid panel done. Has an ED visit f/u appt on 05/03/21 with PCP. Will message provider to see if an order can be placed. Currently has missed two doses of his zetia because he is out of refills. Notified and will get refills sent in. Plan to (other): Continue current medication therapy; Consult with PCP about getting a lipid panel order HC Follow up: 2 months Pharmacist Follow up: 4 months  Diabetes (DM) Current A1C: 11.9 taken on: 05/31/2017 Type: 2 The current microalbumin ratio is: 31.1 tested on: 04/20/2021 Last DM Foot Exam on: 10/17/2020 Last DM Eye Exam on: 12/02/2019 Assess this condition today?: Yes Goal A1C: < 7.0 % Type: 2 Is Patient taking statin medication: Yes Is patient taking ACEi / ARB?: No Does Patient use RPM device?: No DM RPM device: Does patient qualify?: No Patient checking Blood Sugar at  home?: Yes Does patient use a Continuous Glucose Monitoring (CGM) device?: No How often does patient check Blood Sugars?: twice daily, once before eating and once after. Recent ranges are 160-170s Has patient experienced any hypoglycemic episodes?: No Diet recall discussed: Yes Breakfast: skips Lunch: sandwich, tv dinner Supper: meat, mashed potatoes/mac and cheese Snacks: vienna sausages, beef sticks Beverages: sweet tea We discussed: Diet and exercise extensively, Modifying lifestyle, including to participate in moderate physical activity (e.g., walking) at least 150 minutes per week., Low carbohydrate eating plan with an emphasis on whole grains, legumes, nuts, fruits, and vegetables and minimal refined and processed foods. Assessment:: Controlled Drug: Farxiga 1056m tablet daily Assessment: Appropriate, Effective, Safe, Accessible Drug: Toujeo Max 60 units into the skin once daily Assessment: Appropriate, Effective, Safe, Accessible Drug: Trulicity 3mg47mto skin once weekly Assessment: Appropriate, Effective, Safe, Accessible Additional Info: Patient does not have any history of being on an ACE/ARB and his urine microalbumin is over 30 as of 04/20/21. May need to consider adjusting HTN therapy to include a low dose ACE/ARB for kidney protection. Patient does plan on going back to gym, had paused recently. Plan to (other): Continue current medication therapy. HC Follow up: 2 months Pharmacist Follow up: 4 months   Exercise, Diet and Non-Drug Coordination Needs Additional exercise counseling points. We discussed: targeting at least 151 minutes per week of moderate-intensity aerobic exercise. Additional diet counseling points. We discussed: key components of a low-carb eating plan, aiming to consume at least 8 cups of water day Discussed Non-Drug Care Coordination Needs: Yes Does Patient have Medication financial barriers?: No  Accountable Health Communities Health-Related Social  Needs Screening Tool -  SDOH  What is your living situation today? (ref #1): I have a steady place to live Think about the place you live. Do  you have problems with any of the following? (ref #2): None of the above Within the past 12 months, you worried that your food would run out before you got money to buy more (ref #3): Never true Within the past 12 months, the food you bought just didn't last and you didn't have money to get more (ref #4): Never true In the past 12 months, has lack of reliable transportation kept you from medical appointments, meetings, work or from getting things needed for daily living? (ref #5): No In the past 12 months, has the electric, gas, oil, or water company threatened to shut off services in your home? (ref #6): No How often does anyone, including family and friends, physically hurt you? (ref #7): Never (1) How often does anyone, including family and friends, insult or talk down to you? (ref #8): Never (1) How often does anyone, including friends and family, threaten you with harm? (ref #9): Never (1) How often does anyone, including family and friends, scream or curse at you? (ref #10): Never (1)       Fat and Cholesterol Restricted Eating Plan Getting too much fat and cholesterol in your diet may cause health problems. Choosing the right foods helps keep your fat and cholesterol at normal levels. This can keep you from getting certain diseases. Your doctor may recommend an eating plan that includes: Total fat: ______% or less of total calories a day. This is ______g of fat a day. Saturated fat: ______% or less of total calories a day. This is ______g of saturated fat a day. Cholesterol: less than _________mg a day. Fiber: ______g a day. What are tips for following this plan? General tips Work with your doctor to lose weight if you need to. Avoid: Foods with added sugar. Fried foods. Foods with trans fat or partially hydrogenated oils. This includes  some margarines and baked goods. If you drink alcohol: Limit how much you have to: 0-1 drink a day for women who are not pregnant. 0-2 drinks a day for men. Know how much alcohol is in a drink. In the [place] one drink equals one 12 oz bottle of beer (355 mL), one 5 oz glass of wine (148 mL), or one 1 oz glass of hard liquor (44 mL). Reading food labels Check food labels for: Trans fats. Partially hydrogenated oils. Saturated fat (g) in each serving. Cholesterol (mg) in each serving. Fiber (g) in each serving. Choose foods with healthy fats, such as: Monounsaturated fats and polyunsaturated fats. These include olive and canola oil, flaxseeds, walnuts, almonds, and seeds. Omega-3 fats. These are found in certain fish, flaxseed oil, and ground flaxseeds. Choose grain products that have whole grains. Look for the word "whole" as the first word in the ingredient list. Cooking Cook foods using low-fat methods. These include baking, boiling, grilling, and broiling. Eat more home-cooked foods. Eat at restaurants and buffets less often. Eat less fast food. Avoid cooking using saturated fats, such as butter, cream, palm oil, palm kernel oil, and coconut oil. Meal planning A plate with examples of foods in a healthy diet.  At meals, divide your plate into four equal parts: Fill one-half of your plate with vegetables, green salads, and fruit. Fill one-fourth of your plate with whole grains. Fill one-fourth of your plate with low-fat (lean) protein foods. Eat fish that is high in omega-3 fats at least two times a week. This includes mackerel, tuna, sardines, and salmon. Eat foods that are high in fiber, such as whole  grains, beans, apples, pears, berries, broccoli, carrots, peas, and barley. What foods should I eat? Fruits All fresh, canned (in natural juice), or frozen fruits. Vegetables Fresh or frozen vegetables (raw, steamed, roasted, or grilled). Green salads. Grains Whole grains, such as  whole wheat or whole grain breads, crackers, cereals, and pasta. Unsweetened oatmeal, bulgur, barley, quinoa, or brown rice. Corn or whole wheat flour tortillas. Meats and other protein foods Ground beef (85% or leaner), grass-fed beef, or beef trimmed of fat. Skinless chicken or [place]. Ground chicken or [place]. Pork trimmed of fat. All fish and seafood. Egg whites. Dried beans, peas, or lentils. Unsalted nuts or seeds. Unsalted canned beans. Nut butters without added sugar or oil. Dairy Low-fat or nonfat dairy products, such as skim or 1% milk, 2% or reduced-fat cheeses, low-fat and fat-free ricotta or cottage cheese, or plain low-fat and nonfat yogurt. Fats and oils Tub margarine without trans fats. Light or reduced-fat mayonnaise and salad dressings. Avocado. Olive, canola, sesame, or safflower oils. The items listed above may not be a complete list of foods and beverages you can eat. Contact a dietitian for more information. What foods should I avoid? Fruits Canned fruit in heavy syrup. Fruit in cream or butter sauce. Fried fruit. Vegetables Vegetables cooked in cheese, cream, or butter sauce. Fried vegetables. Grains White bread. White pasta. White rice. Cornbread. Bagels, pastries, and croissants. Crackers and snack foods that contain trans fat and hydrogenated oils. Meats and other protein foods Fatty cuts of meat. Ribs, chicken wings, bacon, sausage, bologna, salami, chitterlings, fatback, hot dogs, bratwurst, and packaged lunch meats. Liver and organ meats. Whole eggs and egg yolks. Chicken and [place] with skin. Fried meat. Dairy Whole or 2% milk, cream, half-and-half, and cream cheese. Whole milk cheeses. Whole-fat or sweetened yogurt. Full-fat cheeses. Nondairy creamers and whipped toppings. Processed cheese, cheese spreads, and cheese curds. Fats and oils Butter, stick margarine, lard, shortening, ghee, or bacon fat. Coconut, palm kernel, and palm oils. Beverages Alcohol.  Sugar-sweetened drinks such as sodas, lemonade, and fruit drinks. Sweets and desserts Corn syrup, sugars, honey, and molasses. Candy. Jam and jelly. Syrup. Sweetened cereals. Cookies, pies, cakes, donuts, muffins, and ice cream. The items listed above may not be a complete list of foods and beverages you should avoid. Contact a dietitian for more information. Summary Choosing the right foods helps keep your fat and cholesterol at normal levels. This can keep you from getting certain diseases. At meals, fill one-half of your plate with vegetables, green salads, and fruits. Eat high fiber foods, like whole grains, beans, apples, pears, berries, carrots, peas, and barley. Limit added sugar, saturated fats, alcohol, and fried foods. This information is not intended to replace advice given to you by your health care provider. Make sure you discuss any questions you have with your health care provider.  Alena Bills Clinical Pharmacist [phone]

## 2021-04-28 ENCOUNTER — Other Ambulatory Visit: Payer: Self-pay

## 2021-04-28 NOTE — Progress Notes (Signed)
Urine Microalbumin has significantly improved when compared to a year ago.

## 2021-05-03 ENCOUNTER — Ambulatory Visit (INDEPENDENT_AMBULATORY_CARE_PROVIDER_SITE_OTHER): Payer: Medicare Other | Admitting: Internal Medicine

## 2021-05-03 ENCOUNTER — Other Ambulatory Visit: Payer: Self-pay

## 2021-05-03 ENCOUNTER — Encounter: Payer: Self-pay | Admitting: Internal Medicine

## 2021-05-03 ENCOUNTER — Telehealth: Payer: Self-pay

## 2021-05-03 VITALS — BP 124/74 | HR 75 | Temp 98.4°F | Resp 16 | Ht 69.0 in | Wt 233.0 lb

## 2021-05-03 DIAGNOSIS — M79671 Pain in right foot: Secondary | ICD-10-CM | POA: Diagnosis not present

## 2021-05-03 DIAGNOSIS — E1165 Type 2 diabetes mellitus with hyperglycemia: Secondary | ICD-10-CM | POA: Diagnosis not present

## 2021-05-03 DIAGNOSIS — E1140 Type 2 diabetes mellitus with diabetic neuropathy, unspecified: Secondary | ICD-10-CM

## 2021-05-03 MED ORDER — GABAPENTIN 100 MG PO CAPS
ORAL_CAPSULE | ORAL | 3 refills | Status: DC
Start: 2021-05-03 — End: 2021-10-19

## 2021-05-03 NOTE — Progress Notes (Signed)
[facility] [place], [place] [address]  Internal MEDICINE  Office Visit Note  Patient Name: Raymond [name]  [id number]  [id number]  Date of Service: 05/05/2021  Chief Complaint  Patient presents with   Follow-up    Right foot sprained, hurts around toes    Depression   Diabetes   Hypertension   Hyperlipidemia   Gastroesophageal Reflux    HPI  Pt is here for ED follow up  He had to go to ED after hitting the wall with his toe when dog jumped on him. He was started on abx, he does feel better. There was no fracture.   patient is a diabetic and suffers from peripheral neuropathy will like to start something for pain it is very uncomfortable and has difficulty falling asleep at night Patient is requesting diabetic foot and eye exam Current Medication: Outpatient Encounter Medications as of 05/03/2021  Medication Sig   ACCU-CHEK FASTCLIX LANCETS MISC yse as directed twice a day   albuterol (PROAIR HFA) 108 (90 Base) MCG/ACT inhaler Inhale 2-4 puffs by mouth Every 4-6 hours as needed for wheezing, cough, and/or shortness of breath   aspirin EC 81 MG tablet Take 81 mg by mouth daily at 3 pm.    atorvastatin (LIPITOR) 40 MG tablet Take 1 tablet (40 mg total) by mouth daily.   Budeson-Glycopyrrol-Formoterol (BREZTRI AEROSPHERE) 160-9-4.8 MCG/ACT AERO Inhale 2 puffs into the lungs 2 (two) times daily.   buPROPion (WELLBUTRIN XL) 150 MG 24 hr tablet Take 1 tablet (150 mg total) by mouth daily. Take along with 300 mg daily   buPROPion (WELLBUTRIN XL) 300 MG 24 hr tablet Take 1 tablet (300 mg total) by mouth daily.   dapagliflozin propanediol (FARXIGA) 10 MG TABS tablet Take 1 tablet (10 mg total) by mouth daily before breakfast.   Dulaglutide 3 MG/0.5ML SOPN Inject 3 mg into the skin once a week.   DULoxetine (CYMBALTA) 60 MG capsule Take 1 capsule (60 mg total) by mouth daily.   ezetimibe (ZETIA) 10 MG tablet Take 1 tablet (10 mg total) by mouth daily.    famotidine (PEPCID) 20 MG tablet Take 1 tablet (20 mg total) by mouth 2 (two) times daily.   gabapentin (NEURONTIN) 100 MG capsule One tab in am and 2 in pm,after one week take 2 tab in am and 3 in pm   glucose blood (ACCU-CHEK AVIVA PLUS) test strip Blood sugar testing TID and as needed. E11.65   hydrOXYzine (VISTARIL) 25 MG capsule Take 1-2 capsules (25-50 mg total) by mouth at bedtime as needed.   insulin glargine, 2 Unit Dial, (TOUJEO MAX SOLOSTAR) 300 UNIT/ML Solostar Pen Inject 50 Units into the skin daily. May decrease to 40 units at bedtime if having episodes of hypoglycemia.   Insulin Pen Needle 32G X 4 MM MISC Use with daily dose insulin and with sliding scale insulin as needed.   MELATONIN PO Take 10 mg by mouth daily.   metoprolol tartrate (LOPRESSOR) 100 MG tablet Take 1 tablet (100 mg total) by mouth 2 (two) times daily.   nitroGLYCERIN (NITROSTAT) 0.4 MG SL tablet Place 1 tablet (0.4 mg total) under the tongue every 5 (five) minutes x 3 doses as needed for chest pain.   pantoprazole (PROTONIX) 40 MG tablet Take 1 tablet (40 mg total) by mouth daily.   polyethylene glycol (MIRALAX) 17 g packet Take 17 g by mouth daily.   temazepam (RESTORIL) 30 MG capsule Take 1 capsule (30 mg total)  by mouth at bedtime as needed for sleep.   colestipol (COLESTID) 1 g tablet Take 2 tablets (2 g total) by mouth daily for 30 doses.   No facility-administered encounter medications on file as of 05/03/2021.    Surgical History: Past Surgical History:  Procedure Laterality Date    ingrown toenail removal     CHOLECYSTECTOMY N/A 01/23/2019   Procedure: LAPAROSCOPIC CHOLECYSTECTOMY;  Surgeon: Olean Ree, MD;  Location: [facility] ORS;  Service: General;  Laterality: N/A;   CLAVICLE SURGERY Right    COLONOSCOPY  2017   COLONOSCOPY WITH PROPOFOL N/A 08/25/2020   Procedure: COLONOSCOPY WITH PROPOFOL;  Surgeon: Virgel Manifold, MD;  Location: [facility] ENDOSCOPY;  Service: Endoscopy;  Laterality: N/A;    ESOPHAGOGASTRODUODENOSCOPY (EGD) WITH PROPOFOL N/A 08/25/2020   Procedure: ESOPHAGOGASTRODUODENOSCOPY (EGD) WITH PROPOFOL;  Surgeon: Virgel Manifold, MD;  Location: [facility] ENDOSCOPY;  Service: Endoscopy;  Laterality: N/A;   MOUTH SURGERY     REMOVED ALL TEETH   SHOULDER SURGERY Right    UPPER GI ENDOSCOPY  2017    Medical History: Past Medical History:  Diagnosis Date   Anxiety    Arthritis    NECK   Bronchitis    Coronary artery disease    Depression    Diabetes mellitus without complication ([place])    Diastasis of rectus abdominis 06/19/2018   Dyspnea    DUE TO GALLBLADDER PER PT   Emphysema lung (HCC)    Family history of adverse reaction to anesthesia    N/V, TROUBLE BREATHING COMING OUT OF ANESTHESIA   Fatty liver    GERD (gastroesophageal reflux disease)    Headache    MIGRAINES   Hyperlipidemia    Hypertension    Irregular heart beat unk   Myocardial infarction ([place]) 2010   MILD    PTSD (post-traumatic stress disorder)    Sleep apnea    USES CPAP   Tachycardia     Family History: Family History  Problem Relation Age of Onset   Hypertension Mother    Diabetes type II Mother    Diabetes Mother    COPD Father    Cancer Father    Heart disease Father    Diabetes Sister    Anxiety disorder Sister    Depression Sister    Diabetes Brother    Anxiety disorder Brother    Depression Brother    Diabetes Maternal Aunt    Diabetes Sister    Anxiety disorder Sister    Depression Sister    Diabetes Sister    Anxiety disorder Sister    Depression Sister    Diabetes Sister    Anxiety disorder Sister    Depression Sister    Colon cancer Maternal Uncle     Social History   Socioeconomic History   Marital status: Divorced    Spouse name: Not on file   Number of children: 4   Years of education: Not on file   Highest education level: 8th grade  Occupational History   Not on file  Tobacco Use   Smoking status: Every Day    Packs/day: 1.50    Years:  40.00    Pack years: 60.00    Types: Cigarettes   Smokeless tobacco: Former    Types: Chew   Tobacco comments:    1 pack per day reported 01/03/2021  Vaping Use   Vaping Use: Former  Substance and Sexual Activity   Alcohol use: No    Alcohol/week: 0.0 standard drinks  Drug use: No   Sexual activity: Not Currently  Other Topics Concern   Not on file  Social History Narrative   Not on file   Social Determinants of Health   Financial Resource Strain: Low Risk    Difficulty of Paying Living Expenses: Not very hard  Food Insecurity: Not on file  Transportation Needs: Not on file  Physical Activity: Not on file  Stress: Not on file  Social Connections: Not on file  Intimate Partner Violence: Not on file      Review of Systems  Constitutional:  Negative for fatigue and fever.  HENT:  Negative for congestion, mouth sores and postnasal drip.   Respiratory:  Negative for cough.   Cardiovascular:  Negative for chest pain.  Genitourinary:  Negative for flank pain.  Skin:  Positive for pallor.  Psychiatric/Behavioral: Negative.     Vital Signs: BP 124/74    Pulse 75    Temp 98.4 F (36.9 C)    Resp 16    Ht 5\' 9"  (1.753 m)    Wt 233 lb (105.7 kg)    SpO2 99%    BMI 34.41 kg/m    Physical Exam Constitutional:      Appearance: Normal appearance.  HENT:     Head: Normocephalic and atraumatic.     Nose: Nose normal.     Mouth/Throat:     Mouth: Mucous membranes are moist.     Pharynx: No posterior oropharyngeal erythema.  Eyes:     Extraocular Movements: Extraocular movements intact.     Pupils: Pupils are equal, round, and reactive to light.  Cardiovascular:     Pulses: Normal pulses.     Heart sounds: Normal heart sounds.  Pulmonary:     Effort: Pulmonary effort is normal.     Breath sounds: Normal breath sounds.  Musculoskeletal:        General: Swelling present.     Comments: Patient has decreased range of motion on his toes second and third mostly however soft  tissue swelling is improving all peripheral pulses are palpable  Neurological:     General: No focal deficit present.     Mental Status: He is alert.  Psychiatric:        Mood and Affect: Mood normal.        Behavior: Behavior normal.       Assessment/Plan: 1. Uncontrolled type 2 diabetes mellitus with hyperglycemia [facility]) Patient will continue to monitor his blood sugar he is doing really well - Ambulatory referral to Podiatry - Ambulatory referral to Ophthalmology  2. Right foot pain Improving we will continue to monitor  3. Controlled type 2 diabetes with neuropathy ([place]) Will start him on gabapentin and titrate as indicated - gabapentin (NEURONTIN) 100 MG capsule; One tab in am and 2 in pm,after one week take 2 tab in am and 3 in pm  Dispense: 125 capsule; Refill: 3   General Counseling: [place] verbalizes understanding of the findings of todays visit and agrees with plan of treatment. I have discussed any further diagnostic evaluation that may be needed or ordered today. We also reviewed his medications today. he has been encouraged to call the office with any questions or concerns that should arise related to todays visit.    Orders Placed This Encounter  Procedures   Ambulatory referral to Podiatry   Ambulatory referral to Ophthalmology    Meds ordered this encounter  Medications   gabapentin (NEURONTIN) 100 MG capsule    Sig: One  tab in am and 2 in pm,after one week take 2 tab in am and 3 in pm    Dispense:  125 capsule    Refill:  3    Total time spent:30 Minutes Time spent includes review of chart, medications, test results, and follow up plan with the patient.   [place] Controlled Substance Database was reviewed by me.   Dr Lavera Guise Internal medicine

## 2021-05-03 NOTE — Telephone Encounter (Signed)
Awaiting 05/03/21 office notes for ophthalmology referral-Toni

## 2021-05-06 NOTE — Telephone Encounter (Signed)
Referral sent via [facility] to [facility]

## 2021-05-09 NOTE — Telephone Encounter (Signed)
Eye appointment 05/25/21 @ 9:00-Raymond [name]

## 2021-05-10 ENCOUNTER — Ambulatory Visit: Payer: Medicare Other | Admitting: Podiatry

## 2021-05-10 ENCOUNTER — Other Ambulatory Visit: Payer: Self-pay

## 2021-05-10 DIAGNOSIS — E0843 Diabetes mellitus due to underlying condition with diabetic autonomic (poly)neuropathy: Secondary | ICD-10-CM | POA: Diagnosis not present

## 2021-05-10 DIAGNOSIS — M778 Other enthesopathies, not elsewhere classified: Secondary | ICD-10-CM

## 2021-05-10 DIAGNOSIS — M792 Neuralgia and neuritis, unspecified: Secondary | ICD-10-CM

## 2021-05-10 MED ORDER — PREGABALIN 75 MG PO CAPS: 75.0000 mg | ORAL_CAPSULE | Freq: Two times a day (BID) | ORAL | 0 refills | Status: DC

## 2021-05-10 NOTE — Progress Notes (Signed)
Subjective:  Patient ID: Raymond [name], male    DOB: Jan 03, 1966,  MRN: [id number]  Chief Complaint  Patient presents with   Diabetes    Pt stated that he has a lot of cramps in his toes    56 y.o. male presents with the above complaint.  Patient presents with complaint of right dorsal foot pain.  Patient states he may have a cramping in the toes that has been hurting him.  He is a diabetic with last A1c of 7.7.  He does have some neuropathy.  He is on gabapentin.  He will he states he had an accident where the dog stepped on the foot has been causing him a lot of pain since then.  He has not seen anyone else prior to seeing me.  He also wanted to discuss treatment options for neuropathy pain.   Review of Systems: Negative except as noted in the HPI. Denies N/V/F/Ch.  Past Medical History:  Diagnosis Date   Anxiety    Arthritis    NECK   Bronchitis    Coronary artery disease    Depression    Diabetes mellitus without complication (HCC)    Diastasis of rectus abdominis 06/19/2018   Dyspnea    DUE TO GALLBLADDER PER PT   Emphysema lung (HCC)    Family history of adverse reaction to anesthesia    N/V, TROUBLE BREATHING COMING OUT OF ANESTHESIA   Fatty liver    GERD (gastroesophageal reflux disease)    Headache    MIGRAINES   Hyperlipidemia    Hypertension    Irregular heart beat unk   Myocardial infarction ([place]) 2010   MILD    PTSD (post-traumatic stress disorder)    Sleep apnea    USES CPAP   Tachycardia     Current Outpatient Medications:    pregabalin (LYRICA) 75 MG capsule, Take 1 capsule (75 mg total) by mouth 2 (two) times daily., Disp: 60 capsule, Rfl: 0   ACCU-CHEK FASTCLIX LANCETS MISC, yse as directed twice a day, Disp: 100 each, Rfl: 1   albuterol (PROAIR HFA) 108 (90 Base) MCG/ACT inhaler, Inhale 2-4 puffs by mouth Every 4-6 hours as needed for wheezing, cough, and/or shortness of breath, Disp: 3 Inhaler, Rfl: 1   aspirin EC 81 MG tablet, Take 81 mg by  mouth daily at 3 pm. , Disp: , Rfl:    atorvastatin (LIPITOR) 40 MG tablet, Take 1 tablet (40 mg total) by mouth daily., Disp: 90 tablet, Rfl: 3   Budeson-Glycopyrrol-Formoterol (BREZTRI AEROSPHERE) 160-9-4.8 MCG/ACT AERO, Inhale 2 puffs into the lungs 2 (two) times daily., Disp: 10.7 g, Rfl: 11   buPROPion (WELLBUTRIN XL) 150 MG 24 hr tablet, Take 1 tablet (150 mg total) by mouth daily. Take along with 300 mg daily, Disp: 90 tablet, Rfl: 0   buPROPion (WELLBUTRIN XL) 300 MG 24 hr tablet, Take 1 tablet (300 mg total) by mouth daily., Disp: 90 tablet, Rfl: 0   colestipol (COLESTID) 1 g tablet, Take 2 tablets (2 g total) by mouth daily for 30 doses., Disp: 60 tablet, Rfl: 2   dapagliflozin propanediol (FARXIGA) 10 MG TABS tablet, Take 1 tablet (10 mg total) by mouth daily before breakfast., Disp: 90 tablet, Rfl: 2   Dulaglutide 3 MG/0.5ML SOPN, Inject 3 mg into the skin once a week., Disp: 6 mL, Rfl: 2   DULoxetine (CYMBALTA) 60 MG capsule, Take 1 capsule (60 mg total) by mouth daily., Disp: 90 capsule, Rfl: 0   ezetimibe (  ZETIA) 10 MG tablet, Take 1 tablet (10 mg total) by mouth daily., Disp: 30 tablet, Rfl: 2   famotidine (PEPCID) 20 MG tablet, Take 1 tablet (20 mg total) by mouth 2 (two) times daily., Disp: 180 tablet, Rfl: 1   gabapentin (NEURONTIN) 100 MG capsule, One tab in am and 2 in pm,after one week take 2 tab in am and 3 in pm, Disp: 125 capsule, Rfl: 3   glucose blood (ACCU-CHEK AVIVA PLUS) test strip, Blood sugar testing TID and as needed. E11.65, Disp: 100 each, Rfl: 12   hydrOXYzine (VISTARIL) 25 MG capsule, Take 1-2 capsules (25-50 mg total) by mouth at bedtime as needed., Disp: 60 capsule, Rfl: 1   insulin glargine, 2 Unit Dial, (TOUJEO MAX SOLOSTAR) 300 UNIT/ML Solostar Pen, Inject 50 Units into the skin daily. May decrease to 40 units at bedtime if having episodes of hypoglycemia., Disp: 10 mL, Rfl: 3   Insulin Pen Needle 32G X 4 MM MISC, Use with daily dose insulin and with sliding  scale insulin as needed., Disp: 100 each, Rfl: 5   MELATONIN PO, Take 10 mg by mouth daily., Disp: , Rfl:    metoprolol tartrate (LOPRESSOR) 100 MG tablet, Take 1 tablet (100 mg total) by mouth 2 (two) times daily., Disp: 180 tablet, Rfl: 1   nitroGLYCERIN (NITROSTAT) 0.4 MG SL tablet, Place 1 tablet (0.4 mg total) under the tongue every 5 (five) minutes x 3 doses as needed for chest pain., Disp: 30 tablet, Rfl: 1   pantoprazole (PROTONIX) 40 MG tablet, Take 1 tablet (40 mg total) by mouth daily., Disp: 90 tablet, Rfl: 1   polyethylene glycol (MIRALAX) 17 g packet, Take 17 g by mouth daily., Disp: 30 each, Rfl: 0   temazepam (RESTORIL) 30 MG capsule, Take 1 capsule (30 mg total) by mouth at bedtime as needed for sleep., Disp: 30 capsule, Rfl: 1  Social History   Tobacco Use  Smoking Status Every Day   Packs/day: 1.50   Years: 40.00   Pack years: 60.00   Types: Cigarettes  Smokeless Tobacco Former   Types: Chew  Tobacco Comments   1 pack per day reported 01/03/2021    Allergies  Allergen Reactions   Onion Anaphylaxis   Hydrocodone    Nicoderm [Nicotine]    Norco [Hydrocodone-Acetaminophen] Itching   Hydrocodone-Acetaminophen Itching   Nickel Rash   Objective:  There were no vitals filed for this visit. There is no height or weight on file to calculate BMI. Constitutional Well developed. Well nourished.  Vascular Dorsalis pedis pulses palpable bilaterally. Posterior tibial pulses palpable bilaterally. Capillary refill normal to all digits.  No cyanosis or clubbing noted. Pedal hair growth normal.  Neurologic Normal speech. Oriented to person, place, and time. Diminished sensation to light touch grossly present bilaterally.  Neuropathic pain noted  Dermatologic Nails well groomed and normal in appearance. No open wounds. No skin lesions.  Orthopedic: Pain on palpation to the right dorsal third metatarsophalangeal joint.  No deep intra-articular pain noted.  Pain with  resisted dorsiflexion of the toe consistent with extensor tendinitis.  No pain at any other metatarsophalangeal joint.  No pain on the plantar side of the joints.   Radiographs: None Assessment:   1. Neuropathic pain   2. Diabetes mellitus due to underlying condition with diabetic autonomic neuropathy, unspecified whether long term insulin use ([place])   3. Extensor tendinitis of foot    Plan:  Patient was evaluated and treated and all questions answered.  Right  extensor tendinitis -All questions and concerns were discussed with the patient in extensive detail. -Clinically given the amount of pain that he is having I believe patient will benefit from cam boot immobilization to allow the soft tissue to heal appropriately.  She still complaining tingling sensations all daily. -Cam boot was dispensed  Neuropathic pain -I explained to the patient the etiology of neuropathy and various treatment options were discussed.  I encouraged glucose control. -Clinically gabapentin does not seem to be working I encouraged him to try Lyrica and see if that is helpful.  If it is not he can stop immediately. -I will send him 30-day supply of Lyrica.  If it is beneficial he can discuss it with the primary care physician to manage the medication.  No follow-ups on file.

## 2021-05-22 ENCOUNTER — Encounter: Payer: Self-pay | Admitting: Nurse Practitioner

## 2021-05-22 DIAGNOSIS — I10 Essential (primary) hypertension: Secondary | ICD-10-CM | POA: Diagnosis not present

## 2021-05-22 DIAGNOSIS — K219 Gastro-esophageal reflux disease without esophagitis: Secondary | ICD-10-CM | POA: Diagnosis not present

## 2021-05-25 DIAGNOSIS — E119 Type 2 diabetes mellitus without complications: Secondary | ICD-10-CM | POA: Diagnosis not present

## 2021-05-27 ENCOUNTER — Telehealth: Payer: Self-pay | Admitting: Student-PharmD

## 2021-05-27 NOTE — Progress Notes (Addendum)
HLD Review Call   [place]  [id number] 89 years, Male  DOB: [birth year]  M: [phone]  Hyperlipidemia/Dyslipidemia Review (HC) Completed by Charlann Lange on 05/27/2021  Chart Review Lipid Panel Date: 08/16/2020 TC: 280 LDL: 153 HDL: 31 TG: 501 Are TC > 200, LDL >100, HDL < 40, TG > 150?: Yes  What recent interventions have been made by any provider to improve the patient's conditions in the last 3 months?:  Office Visits: 05/03/21 Lavera Guise, MD For Type 2 diabetic mellitus. STARTED Gabapentin 100 mg  One tab in am and 2 in pm, after one week take 2 tab in am and 3 in pm  Consults: 05/10/21 Podiatry Posey Pronto, Thomasene Lot, DPM For Neuropathic pain. STARTED Pregabalin 75 mg 2 times daily.  Any recent hospitalizations or ED visits since last visit with CPP?: No  Adherence Review Adherence rates for STAR metric medications:  Farxiga 10 mg 03/06/21 90 DS Atorvastatin 40 mg 03/12/21 90 DS Dulaglutide 3 MG/0.5ML SOPN 04/29/21 84 DS  Adherence rates for medications indicated for disease state being reviewed: Atorvastatin 40 mg 03/12/21 90 DS  Does the patient have >5 day gap between last estimated fill dates for any of the above medications?: No  Disease State Questions Able to connect with the Patient?: Yes  Are you having any side effects from your cholesterol medications?: No  What diet changes have you made to improve your Cholesterol?: increasing intake of healthy fats, eating more home-cooked meals  What exercise are you doing to improve your Cholesterol?: no formal exercise, silver Sneakers, walking  Engagement Notes Charlann Lange on 05/27/2021 03:07 PM [facility] Chart Review: 15 min 05/27/21 [facility] Assessment call time spent: 15 min 05/27/21 [facility]PP Office Visit Documentation: CPP Coordination of Care: CPP Care Plan Review:  Charlann Lange, [place]  [phone]  Pharmacy Review Adherence gaps identified?: No Drug Therapy Problems identified?:  No Assessment: Uncontrolled Plan: labs are from April 2022, need updated labs   Reviewed by: Alena Bills, PharmD Clinical Pharmacist [phone]

## 2021-06-09 ENCOUNTER — Other Ambulatory Visit: Payer: Self-pay

## 2021-06-09 ENCOUNTER — Encounter: Payer: Self-pay | Admitting: Podiatry

## 2021-06-09 ENCOUNTER — Ambulatory Visit: Payer: Medicare Other | Admitting: Podiatry

## 2021-06-09 DIAGNOSIS — M2041 Other hammer toe(s) (acquired), right foot: Secondary | ICD-10-CM

## 2021-06-09 DIAGNOSIS — M778 Other enthesopathies, not elsewhere classified: Secondary | ICD-10-CM | POA: Diagnosis not present

## 2021-06-09 DIAGNOSIS — E0843 Diabetes mellitus due to underlying condition with diabetic autonomic (poly)neuropathy: Secondary | ICD-10-CM | POA: Diagnosis not present

## 2021-06-09 DIAGNOSIS — M2042 Other hammer toe(s) (acquired), left foot: Secondary | ICD-10-CM

## 2021-06-09 NOTE — Progress Notes (Signed)
Subjective:  Patient ID: Raymond [name], male    DOB: [birth year],  MRN: [id number]  Chief Complaint  Patient presents with   Peripheral Neuropathy    Pt stated that he still has some tenderness     56 y.o. male presents with the above complaint.  Patient presents with follow-up to right dorsal extensor tendinitis.  Patient states is doing a lot better the cam boot helped.  He would like to discuss next treatment plans.  He states he still has some residual pain.  3 out of 10 hurts with ambulation.  He would like to discuss diabetic shoes as well.  He is a diabetic he is got no severe neuropathy he has not seen anyone else prior to seeing me he has not received diabetic shoes in the past.   Review of Systems: Negative except as noted in the HPI. Denies N/V/F/Ch.  Past Medical History:  Diagnosis Date   Anxiety    Arthritis    NECK   Bronchitis    Coronary artery disease    Depression    Diabetes mellitus without complication (HCC)    Diastasis of rectus abdominis 06/19/2018   Dyspnea    DUE TO GALLBLADDER PER PT   Emphysema lung (HCC)    Family history of adverse reaction to anesthesia    N/V, TROUBLE BREATHING COMING OUT OF ANESTHESIA   Fatty liver    GERD (gastroesophageal reflux disease)    Headache    MIGRAINES   Hyperlipidemia    Hypertension    Irregular heart beat unk   Myocardial infarction ([place]) 2010   MILD    PTSD (post-traumatic stress disorder)    Sleep apnea    USES CPAP   Tachycardia     Current Outpatient Medications:    ACCU-CHEK FASTCLIX LANCETS MISC, yse as directed twice a day, Disp: 100 each, Rfl: 1   albuterol (PROAIR HFA) 108 (90 Base) MCG/ACT inhaler, Inhale 2-4 puffs by mouth Every 4-6 hours as needed for wheezing, cough, and/or shortness of breath, Disp: 3 Inhaler, Rfl: 1   aspirin EC 81 MG tablet, Take 81 mg by mouth daily at 3 pm. , Disp: , Rfl:    atorvastatin (LIPITOR) 40 MG tablet, Take 1 tablet (40 mg total) by mouth daily., Disp: 90  tablet, Rfl: 3   Budeson-Glycopyrrol-Formoterol (BREZTRI AEROSPHERE) 160-9-4.8 MCG/ACT AERO, Inhale 2 puffs into the lungs 2 (two) times daily., Disp: 10.7 g, Rfl: 11   buPROPion (WELLBUTRIN XL) 150 MG 24 hr tablet, Take 1 tablet (150 mg total) by mouth daily. Take along with 300 mg daily, Disp: 90 tablet, Rfl: 0   buPROPion (WELLBUTRIN XL) 300 MG 24 hr tablet, Take 1 tablet (300 mg total) by mouth daily., Disp: 90 tablet, Rfl: 0   colestipol (COLESTID) 1 g tablet, Take 2 tablets (2 g total) by mouth daily for 30 doses., Disp: 60 tablet, Rfl: 2   dapagliflozin propanediol (FARXIGA) 10 MG TABS tablet, Take 1 tablet (10 mg total) by mouth daily before breakfast., Disp: 90 tablet, Rfl: 2   Dulaglutide 3 MG/0.5ML SOPN, Inject 3 mg into the skin once a week., Disp: 6 mL, Rfl: 2   DULoxetine (CYMBALTA) 60 MG capsule, Take 1 capsule (60 mg total) by mouth daily., Disp: 90 capsule, Rfl: 0   ezetimibe (ZETIA) 10 MG tablet, Take 1 tablet (10 mg total) by mouth daily., Disp: 30 tablet, Rfl: 2   famotidine (PEPCID) 20 MG tablet, Take 1 tablet (20 mg total) by mouth  2 (two) times daily., Disp: 180 tablet, Rfl: 1   gabapentin (NEURONTIN) 100 MG capsule, One tab in am and 2 in pm,after one week take 2 tab in am and 3 in pm, Disp: 125 capsule, Rfl: 3   glucose blood (ACCU-CHEK AVIVA PLUS) test strip, Blood sugar testing TID and as needed. E11.65, Disp: 100 each, Rfl: 12   hydrOXYzine (VISTARIL) 25 MG capsule, Take 1-2 capsules (25-50 mg total) by mouth at bedtime as needed., Disp: 60 capsule, Rfl: 1   insulin glargine, 2 Unit Dial, (TOUJEO MAX SOLOSTAR) 300 UNIT/ML Solostar Pen, Inject 50 Units into the skin daily. May decrease to 40 units at bedtime if having episodes of hypoglycemia., Disp: 10 mL, Rfl: 3   Insulin Pen Needle 32G X 4 MM MISC, Use with daily dose insulin and with sliding scale insulin as needed., Disp: 100 each, Rfl: 5   MELATONIN PO, Take 10 mg by mouth daily., Disp: , Rfl:    metoprolol tartrate  (LOPRESSOR) 100 MG tablet, Take 1 tablet (100 mg total) by mouth 2 (two) times daily., Disp: 180 tablet, Rfl: 1   nitroGLYCERIN (NITROSTAT) 0.4 MG SL tablet, Place 1 tablet (0.4 mg total) under the tongue every 5 (five) minutes x 3 doses as needed for chest pain., Disp: 30 tablet, Rfl: 1   pantoprazole (PROTONIX) 40 MG tablet, Take 1 tablet (40 mg total) by mouth daily., Disp: 90 tablet, Rfl: 1   polyethylene glycol (MIRALAX) 17 g packet, Take 17 g by mouth daily., Disp: 30 each, Rfl: 0   pregabalin (LYRICA) 75 MG capsule, Take 1 capsule (75 mg total) by mouth 2 (two) times daily., Disp: 60 capsule, Rfl: 0   temazepam (RESTORIL) 30 MG capsule, Take 1 capsule (30 mg total) by mouth at bedtime as needed for sleep., Disp: 30 capsule, Rfl: 1  Social History   Tobacco Use  Smoking Status Every Day   Packs/day: 1.50   Years: 40.00   Pack years: 60.00   Types: Cigarettes  Smokeless Tobacco Former   Types: Chew  Tobacco Comments   1 pack per day reported 01/03/2021    Allergies  Allergen Reactions   Onion Anaphylaxis   Hydrocodone    Nicoderm [Nicotine]    Norco [Hydrocodone-Acetaminophen] Itching   Hydrocodone-Acetaminophen Itching   Nickel Rash   Objective:  There were no vitals filed for this visit. There is no height or weight on file to calculate BMI. Constitutional Well developed. Well nourished.  Vascular Dorsalis pedis pulses palpable bilaterally. Posterior tibial pulses palpable bilaterally. Capillary refill normal to all digits.  No cyanosis or clubbing noted. Pedal hair growth normal.  Neurologic Normal speech. Oriented to person, place, and time. Diminished sensation to light touch grossly present bilaterally.  Neuropathic pain noted  Dermatologic Nails well groomed and normal in appearance. No open wounds. No skin lesions.  Orthopedic: Pain on palpation to the right dorsal third metatarsophalangeal joint.  No deep intra-articular pain noted.  Pain with resisted  dorsiflexion of the toe consistent with extensor tendinitis.  No pain at any other metatarsophalangeal joint.  No pain on the plantar side of the joints.  Hammertoe contractures noted 2 through 5 bilaterally.  Semiflexible in nature.  Mild pain on palpation.  No ulceration noted.   Radiographs: None Assessment:   1. Extensor tendinitis of foot   2. Diabetes mellitus due to underlying condition with diabetic autonomic neuropathy, unspecified whether long term insulin use (HCC)   3. Hammertoe, bilateral  Plan:  Patient was evaluated and treated and all questions answered.  Hammertoes 2 through 5 bilateral -Semiflexible hammertoe contractures were discussed with the patient in extensive detail.  Given that he is a diabetic in the setting of neuropathy I believe patient will benefit from diabetic shoes to evaluate distribute the pressure and take the pressure off of the toes.  I discussed this with patient he states understanding -He will be scheduled to see Liliane Channel for diabetic shoes  Right extensor tendinitis -All questions and concerns were discussed with the patient in extensive detail. -Clinically his pain has improved.  He still has some residual pain for which I believe he will benefit from a steroid injection.  I believe patient would benefit from a steroid injection.  I discussed with him this is near the tendon however he states understanding and would like to proceed with management despite the risk of tendon rupture. -A steroid injection was performed at right dorsal foot at point of maximal tenderness using 1% plain Lidocaine and 10 mg of Kenalog. This was well tolerated.   Neuropathic pain -I explained to the patient the etiology of neuropathy and various treatment options were discussed.  I encouraged glucose control. -I discontinued Lyrica and sent him back to gabapentin as it was causing him some diarrhea.  Unfortunately discussed with the patient that there are no better  management for it.  I discussed some topical application as well.  No follow-ups on file.

## 2021-06-20 ENCOUNTER — Telehealth (INDEPENDENT_AMBULATORY_CARE_PROVIDER_SITE_OTHER): Payer: Medicare Other | Admitting: Psychiatry

## 2021-06-20 ENCOUNTER — Other Ambulatory Visit: Payer: Self-pay

## 2021-06-20 ENCOUNTER — Encounter: Payer: Self-pay | Admitting: Psychiatry

## 2021-06-20 DIAGNOSIS — F4310 Post-traumatic stress disorder, unspecified: Secondary | ICD-10-CM

## 2021-06-20 DIAGNOSIS — F3342 Major depressive disorder, recurrent, in full remission: Secondary | ICD-10-CM

## 2021-06-20 DIAGNOSIS — G4701 Insomnia due to medical condition: Secondary | ICD-10-CM | POA: Diagnosis not present

## 2021-06-20 DIAGNOSIS — F17200 Nicotine dependence, unspecified, uncomplicated: Secondary | ICD-10-CM | POA: Diagnosis not present

## 2021-06-20 DIAGNOSIS — F331 Major depressive disorder, recurrent, moderate: Secondary | ICD-10-CM

## 2021-06-20 MED ORDER — DULOXETINE HCL 60 MG PO CPEP: 60.0000 mg | ORAL_CAPSULE | Freq: Every day | ORAL | 0 refills | Status: DC

## 2021-06-20 MED ORDER — BUPROPION HCL ER (XL) 300 MG PO TB24: 300.0000 mg | ORAL_TABLET | Freq: Every day | ORAL | 0 refills | Status: DC

## 2021-06-20 MED ORDER — BUPROPION HCL ER (XL) 150 MG PO TB24: 150.0000 mg | ORAL_TABLET | Freq: Every day | ORAL | 0 refills | Status: DC

## 2021-06-20 MED ORDER — TEMAZEPAM 30 MG PO CAPS: 30.0000 mg | ORAL_CAPSULE | Freq: Every evening | ORAL | 1 refills | Status: DC | PRN

## 2021-06-20 NOTE — Progress Notes (Signed)
Virtual Visit via Video Note  I connected with Raymond [name] on 06/20/21 at 10:00 AM EST by a video enabled telemedicine application and verified that I am speaking with the correct person using two identifiers.  Location Provider Location : [facility] Patient Location : Home  Participants: Patient , Provider   I discussed the limitations of evaluation and management by telemedicine and the availability of in person appointments. The patient expressed understanding and agreed to proceed.    I discussed the assessment and treatment plan with the patient. The patient was provided an opportunity to ask questions and all were answered. The patient agreed with the plan and demonstrated an understanding of the instructions.   The patient was advised to call back or seek an in-person evaluation if the symptoms worsen or if the condition fails to improve as anticipated.                                                            Raymond [name] Progress Note  06/20/2021 10:12 AM DSHAUN REPPUCCI  MRN:  [id number]  Chief Complaint:  Chief Complaint  Patient presents with   Follow-up: 56 year old male, has a history of MDD, PTSD, tobacco use disorder, presented for medication management.   HPI: [name] is a 56 year old male, divorced, has a history of MDD, PTSD, tobacco use disorder, uncontrolled diabetes, hyperlipidemia, hepatic steatosis, lives in [place] was evaluated by telemedicine today.  Patient today reports overall mood symptoms has been stable.  Denies any significant sadness, crying spells, appetite changes.  Reports sleep is good on the temazepam.  Reports he is currently compliant on medications.  Denies side effects.  Reports he continues to have trouble with smoking cessation.  Has been trying to cut back however gets anxious when he does that.  He also has increased appetite when he does that.  Currently trying to stay focused with other activities like video games,  spending time with family.  Continues to be interested in smoking cessation.  Agreeable to continue to work on this.  Denies suicidality, homicidality or perceptual disturbances.  Denies any other concerns today.  Visit Diagnosis:    ICD-10-CM   1. MDD (major depressive disorder), recurrent, in full remission (Raymond [name])  F33.42 temazepam (RESTORIL) 30 MG capsule    buPROPion (WELLBUTRIN XL) 150 MG 24 hr tablet    2. PTSD (post-traumatic stress disorder)  F43.10 temazepam (RESTORIL) 30 MG capsule    DULoxetine (CYMBALTA) 60 MG capsule    3. Insomnia due to medical condition  G47.01 buPROPion (WELLBUTRIN XL) 300 MG 24 hr tablet   osa, mood    4. Tobacco use disorder  F17.200 buPROPion (WELLBUTRIN XL) 300 MG 24 hr tablet      Past Psychiatric History: Reviewed past psychiatric history from progress note on 05/23/2018.  Past trials of Tegretol, doxepin, Klonopin, Seroquel, mirtazapine, Belsomra, Lunesta, Ativan, risperidone, Lexapro.  Past Medical History:  Past Medical History:  Diagnosis Date   Anxiety    Arthritis    NECK   Bronchitis    Coronary artery disease    Depression    Diabetes mellitus without complication (HCC)    Diastasis of rectus abdominis 06/19/2018   Dyspnea    DUE TO GALLBLADDER PER PT   Emphysema lung ([place])  Family history of adverse reaction to anesthesia    N/V, TROUBLE BREATHING COMING OUT OF ANESTHESIA   Fatty liver    GERD (gastroesophageal reflux disease)    Headache    MIGRAINES   Hyperlipidemia    Hypertension    Irregular heart beat unk   Myocardial infarction ([place]) 2010   MILD    PTSD (post-traumatic stress disorder)    Sleep apnea    USES CPAP   Tachycardia     Past Surgical History:  Procedure Laterality Date    ingrown toenail removal     CHOLECYSTECTOMY N/A 01/23/2019   Procedure: LAPAROSCOPIC CHOLECYSTECTOMY;  Surgeon: Olean Ree, MD;  Location: [facility] ORS;  Service: General;  Laterality: N/A;   CLAVICLE SURGERY Right     COLONOSCOPY  2017   COLONOSCOPY WITH PROPOFOL N/A 08/25/2020   Procedure: COLONOSCOPY WITH PROPOFOL;  Surgeon: Virgel Manifold, MD;  Location: [facility] END[facility]COPY;  Service: Endoscopy;  Laterality: N/A;   ESOPHAGOGASTRODUODENOSCOPY (EGD) WITH PROPOFOL N/A 08/25/2020   Procedure: ESOPHAGOGASTRODUODENOSCOPY (EGD) WITH PROPOFOL;  Surgeon: Virgel Manifold, MD;  Location: [facility] END[facility]COPY;  Service: Endoscopy;  Laterality: N/A;   MOUTH SURGERY     REMOVED ALL TEETH   SHOULDER SURGERY Right    UPPER GI ENDOSCOPY  2017    Family Psychiatric History: Reviewed family psychiatric history from progress note on 05/23/2018.  Family History:  Family History  Problem Relation Age of Onset   Hypertension Mother    Diabetes type II Mother    Diabetes Mother    COPD Father    Cancer Father    Heart disease Father    Diabetes Sister    Anxiety disorder Sister    Depression Sister    Diabetes Brother    Anxiety disorder Brother    Depression Brother    Diabetes Maternal Aunt    Diabetes Sister    Anxiety disorder Sister    Depression Sister    Diabetes Sister    Anxiety disorder Sister    Depression Sister    Diabetes Sister    Anxiety disorder Sister    Depression Sister    Colon cancer Maternal Uncle     Social History: Reviewed social history from progress note on 05/23/2018. Social History   Socioeconomic History   Marital status: Divorced    Spouse name: Not on file   Number of children: 4   Years of education: Not on file   Highest education level: 8th grade  Occupational History   Not on file  Tobacco Use   Smoking status: Every Day    Packs/day: 1.50    Years: 40.00    Pack years: 60.00    Types: Cigarettes   Smokeless tobacco: Former    Types: Chew   Tobacco comments:    1 pack per day reported 01/03/2021  Vaping Use   Vaping Use: Former  Substance and Sexual Activity   Alcohol use: No    Alcohol/week: 0.0 standard drinks   Drug use: No   Sexual activity: Not  Currently  Other Topics Concern   Not on file  Social History Narrative   Not on file   Social Determinants of Health   Financial Resource Strain: Low Risk    Difficulty of Paying Living Expenses: Not very hard  Food Insecurity: Not on file  Transportation Needs: Not on file  Physical Activity: Not on file  Stress: Not on file  Social Connections: Not on file    Allergies:  Allergies  Allergen Reactions   Onion Anaphylaxis   Hydrocodone    Nicoderm [Nicotine]    Norco [Hydrocodone-Acetaminophen] Itching   Hydrocodone-Acetaminophen Itching   Nickel Rash    Metabolic Disorder Labs: Lab Results  Component Value Date   HGBA1C 7.7 (A) 04/20/2021   MPG 294.83 05/31/2017   No results found for: PROLACTIN Lab Results  Component Value Date   CHOL 280 (H) 08/17/2020   TRIG 501 (H) 08/17/2020   HDL 31 (L) 08/17/2020   CHOLHDL 4.8 07/15/2015   VLDL 26 12/09/2012   LDLCALC 153 (H) 08/17/2020   LDLCALC 61 07/15/2015   Lab Results  Component Value Date   TSH 1.890 08/17/2020   TSH 4.120 06/10/2018    Therapeutic Level Labs: No results found for: LITHIUM No results found for: VALPROATE No components found for:  CBMZ  Current Medications: Current Outpatient Medications  Medication Sig Dispense Refill   ACCU-CHEK FASTCLIX LANCETS MISC yse as directed twice a day 100 each 1   albuterol (PROAIR HFA) 108 (90 Base) MCG/ACT inhaler Inhale 2-4 puffs by mouth Every 4-6 hours as needed for wheezing, cough, and/or shortness of breath 3 Inhaler 1   aspirin EC 81 MG tablet Take 81 mg by mouth daily at 3 pm.      atorvastatin (LIPITOR) 40 MG tablet Take 1 tablet (40 mg total) by mouth daily. 90 tablet 3   Budeson-Glycopyrrol-Formoterol (BREZTRI AEROSPHERE) 160-9-4.8 MCG/ACT AERO Inhale 2 puffs into the lungs 2 (two) times daily. 10.7 g 11   buPROPion (WELLBUTRIN XL) 150 MG 24 hr tablet Take 1 tablet (150 mg total) by mouth daily. Take along with 300 mg daily 90 tablet 0   buPROPion  (WELLBUTRIN XL) 300 MG 24 hr tablet Take 1 tablet (300 mg total) by mouth daily. 90 tablet 0   colestipol (COLESTID) 1 g tablet Take 2 tablets (2 g total) by mouth daily for 30 doses. 60 tablet 2   dapagliflozin propanediol (FARXIGA) 10 MG TABS tablet Take 1 tablet (10 mg total) by mouth daily before breakfast. 90 tablet 2   Dulaglutide 3 MG/0.5ML SOPN Inject 3 mg into the skin once a week. 6 mL 2   DULoxetine (CYMBALTA) 60 MG capsule Take 1 capsule (60 mg total) by mouth daily. 90 capsule 0   ezetimibe (ZETIA) 10 MG tablet Take 1 tablet (10 mg total) by mouth daily. 30 tablet 2   famotidine (PEPCID) 20 MG tablet Take 1 tablet (20 mg total) by mouth 2 (two) times daily. 180 tablet 1   gabapentin (NEURONTIN) 100 MG capsule One tab in am and 2 in pm,after one week take 2 tab in am and 3 in pm 125 capsule 3   glucose blood (ACCU-CHEK AVIVA PLUS) test strip Blood sugar testing TID and as needed. E11.65 100 each 12   hydrOXYzine (VISTARIL) 25 MG capsule Take 1-2 capsules (25-50 mg total) by mouth at bedtime as needed. 60 capsule 1   insulin glargine, 2 Unit Dial, (TOUJEO MAX SOLOSTAR) 300 UNIT/ML Solostar Pen Inject 50 Units into the skin daily. May decrease to 40 units at bedtime if having episodes of hypoglycemia. 10 mL 3   Insulin Pen Needle 32G X 4 MM MISC Use with daily dose insulin and with sliding scale insulin as needed. 100 each 5   MELATONIN PO Take 10 mg by mouth daily.     metoprolol tartrate (LOPRESSOR) 100 MG tablet Take 1 tablet (100 mg total) by mouth 2 (two) times daily. 180 tablet 1  nitroGLYCERIN (NITROSTAT) 0.4 MG SL tablet Place 1 tablet (0.4 mg total) under the tongue every 5 (five) minutes x 3 doses as needed for chest pain. 30 tablet 1   pantoprazole (PROTONIX) 40 MG tablet Take 1 tablet (40 mg total) by mouth daily. 90 tablet 1   polyethylene glycol (MIRALAX) 17 g packet Take 17 g by mouth daily. 30 each 0   pregabalin (LYRICA) 75 MG capsule Take 1 capsule (75 mg total) by mouth  2 (two) times daily. 60 capsule 0   temazepam (RESTORIL) 30 MG capsule Take 1 capsule (30 mg total) by mouth at bedtime as needed for sleep. 30 capsule 1   No current facility-administered medications for this visit.     Musculoskeletal: Strength & Muscle Tone: within normal limits Gait & Station: normal Patient leans: N/A  Psychiatric Specialty Exam: Review of Systems  Psychiatric/Behavioral:  The patient is nervous/anxious.   All other systems reviewed and are negative.  There were no vitals taken for this visit.There is no height or weight on file to calculate BMI.  General Appearance: Casual  Eye Contact:  Fair  Speech:  Clear and Coherent  Volume:  Normal  Mood:  Anxious coping   Affect:  Congruent  Thought Process:  Goal Directed and Descriptions of Associations: Intact  Orientation:  Full (Time, Place, and Person)  Thought Content: Logical   Suicidal Thoughts:  No  Homicidal Thoughts:  No  Memory:  Immediate;   Fair Recent;   Fair Remote;   Fair  Judgement:  Fair  Insight:  Fair  Psychomotor Activity:  Normal  Concentration:  Concentration: Fair and Attention Span: Fair  Recall:  [employer] of Knowledge: Fair  Language: Fair  Akathisia:  No  Handed:  Right  AIMS (if indicated): done, 0  Assets:  Communication Skills Desire for Improvement Housing Social Support  ADL's:  Intact  Cognition: WNL  Sleep:  Fair   Screenings: GAD-7    Flowsheet Row Video Visit from 06/20/2021 in [place] Office Visit from 11/22/2020 in [place]  Total GAD-7 Score 8 [place] from 10/18/2020 in [facility], [place] from 10/17/2019 in [facility], [place] from 10/08/2018 in [facility], [place] from 10/05/2017 in [facility], [facility]  Total Score (max 30 points ) 29 30 27 30       PHQ2-9     Flowsheet Row Video Visit from 06/20/2021 in [place] Video Visit from 04/22/2021 in [place] from 04/20/2021 in [facility], [facility] Video Visit from 03/31/2021 in [place] Visit from 01/17/2021 in [facility], [facility]  PHQ-2 Total Score 1 1 0 5 0  PHQ-9 Total Score -- 3 -- 16 --      Flowsheet Row ED from 04/22/2021 in [place] Office Visit from 11/22/2020 in [place] Admission (Discharged) from 08/25/2020 in [place] No Risk No Risk No Risk        Assessment and Plan: Raymond [name] is a 55 year old Caucasian male who lives in [place], divorced, has a history of MDD, insomnia, diabetes mellitus, history of vision loss, neuropathy was evaluated by telemedicine today.  Patient with anxiety related to smoking cessation otherwise doing well on the current medication regimen.  Plan as noted below.  Plan MDD in remission Cymbalta 60 mg p.o. daily Wellbutrin XL 450 mg p.o. daily  Insomnia-stable Temazepam 30 mg p.o. nightly Hydroxyzine 25-50 mg p.o. nightly as needed CPAP for OSA. Sleep study-01/05/2021-patient with periodic limb movement disorder.  PTSD-stable Cymbalta 60 mg p.o. daily  Tobacco use disorder-unstable Provided counseling for 5 minutes Attempted motivational interviewing.  Abnormal LFTs-reviewed and discussed labs-hepatic function panel-04/26/2021-alkaline phosphatase-133-downtrending-otherwise within normal limits.   Follow-up in clinic in 3 months or sooner if needed.    Consent: Patient/Guardian gives verbal consent for treatment and assignment of benefits for services provided during this visit. Patient/Guardian expressed understanding and agreed to proceed.   This note was generated in part or  whole with voice recognition software. Voice recognition is usually quite accurate but there are transcription errors that can and very often do occur. I apologize for any typographical errors that were not detected and corrected.      Ursula Alert, MD 06/21/2021, 12:53 PM

## 2021-06-21 ENCOUNTER — Encounter: Payer: Self-pay | Admitting: Psychiatry

## 2021-06-21 DIAGNOSIS — I10 Essential (primary) hypertension: Secondary | ICD-10-CM | POA: Diagnosis not present

## 2021-06-21 DIAGNOSIS — K219 Gastro-esophageal reflux disease without esophagitis: Secondary | ICD-10-CM | POA: Diagnosis not present

## 2021-06-23 ENCOUNTER — Encounter: Payer: Self-pay | Admitting: Nurse Practitioner

## 2021-06-29 ENCOUNTER — Telehealth: Payer: Self-pay | Admitting: Student-PharmD

## 2021-06-29 NOTE — Progress Notes (Addendum)
Hypertension (HTN) Review Call  ? ?Raymond [name],Raymond [name]  [id number] ? ?25 years, Male DOB: 09-30-1965 M: [phone] ? ?Hypertension Review (HC) ?Completed by Charlann Lange on 06/29/2021 ? ?Chart Review ?Is the patient enrolled in RPM with BP Monitor?: No ? ?BP #1 reading (last): 124/74 ?on: 05/03/2021 ? ?BP #2 reading: 114/74 ?on: 04/20/2021 ? ?BP #3 reading: 129/89 ?on: 01/16/2021 ? ?Any of the last 3 BP > 140/90 mmHg?: No ? ?What recent interventions have been made by any provider to improve the patient's conditions in the last 3 months?: ? ?Consults: ?06/09/21 Podiatry Posey Pronto, Thomasene Lot, DPM For Extensor tendinitis of foot . No medication changes. ? ?06/20/21 Video Visit: Behavioral health Ursula Alert, MD/. For major depression. ? ?Any recent hospitalizations or ED visits since last visit with CPP?: No ? ?Adherence Review ?Adherence rates for STAR metric medications:  ?Farxiga 10 mg 05/30/21 90 DS ?Dulaglutide '3mg'$ /0.71m 05/25/21 - Unknown ?Atorvastatin 40 mg 06/09/21 90 DS ? ?Adherence rates for medications indicated for disease state being reviewed: None. ? ?Does the patient have >5 day gap between last estimated fill dates for any of the above medications?: No ? ?Disease State Questions ?Able to connect with the Patient?: Yes ? ?Is the patient monitoring his/her BP?: No ? ?Review recommendations from CPP's note of how often patient should be checking and encourage monitoring blood pressures if patient has history of high BP.: Done ? ?What is your blood pressure goal?: 120/80 ? ?Educate patient to inform proper points on checking BP at home:: Sit with feet flat on the floor, arm at heart level., Do not drink caffeine or smoke a cigarette at least 30 min. prior to checking. ? ?What diet changes have you made to improve your Blood Pressure Control?: eating more home-cooked meals ? ?What exercise are you doing to improve your Blood Pressure Control?: other ? 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 ?Engagement Notes ? ?MCharlann Langeon 06/28/2021 04:32 PM ?[facility] Review: 13 min 06/28/21 ?[facility] CenterAssessment call time spent: 12 min 06/29/21 ? ?VCharlann Lange RMA ?Healthcare Concierge  ?[phone]? ?Pharmacist Review ?Adherence gaps identified?: No ?Drug Therapy Problems identified?: No ?Assessment: Controlled ? ?AAlena Bills?Clinical Pharmacist ? ?

## 2021-07-04 ENCOUNTER — Other Ambulatory Visit: Payer: Self-pay

## 2021-07-04 ENCOUNTER — Ambulatory Visit: Payer: Medicare Other

## 2021-07-04 DIAGNOSIS — M2041 Other hammer toe(s) (acquired), right foot: Secondary | ICD-10-CM

## 2021-07-04 DIAGNOSIS — E0843 Diabetes mellitus due to underlying condition with diabetic autonomic (poly)neuropathy: Secondary | ICD-10-CM

## 2021-07-04 NOTE — Progress Notes (Signed)
SITUATION ?Reason for Consult: Evaluation for Prefabricated Diabetic Shoes and Custom Diabetic Inserts. ?Patient / Caregiver Report: Patient would like well fitting shoes ? ?OBJECTIVE DATA: ?Patient History / Diagnosis:  ?  ICD-10-CM   ?1. Diabetes mellitus due to underlying condition with diabetic autonomic neuropathy, unspecified whether long term insulin use (HCC)  E08.43   ?  ?2. Hammertoe, bilateral  M20.41   ? M20.42   ?  ? ? ?Current or Previous Devices:   None and no history ? ?In-Person Foot Examination: ?Ulcers & Callousing:   None ? ?Deformities:   ?- Hammertoes ?  ?Shoe Size: 12W ? ?ORTHOTIC RECOMMENDATION ?Recommended Devices: ?- 1x pair prefabricated PDAC approved diabetic shoes; Patient Selected - Orthofeet Sprint 674 Blue Size 12W ?- 3x pair custom-to-patient PDAC approved vacuum formed diabetic insoles. ? ?GOALS OF SHOES AND INSOLES ?- Reduce shear and pressure ?- Reduce / Prevent callus formation ?- Reduce / Prevent ulceration ?- Protect the fragile healing compromised diabetic foot. ? ?Patient would benefit from diabetic shoes and inserts as patient has diabetes mellitus and the patient has one or more of the following conditions: ?- Peripheral neuropathy with evidence of callus formation ?- Foot deformity ?- Poor circulation ? ?ACTIONS PERFORMED ?Patient was casted for insoles via crush box and measured for shoes via brannock device. Procedure was explained and patient tolerated procedure well. All questions were answered and concerns addressed. ? ?PLAN ?Patient is to be contacted and scheduled for fitting once CMN is obtained from treaing physician and shoes and insoles have been fabricated and received. ? ?

## 2021-07-12 ENCOUNTER — Other Ambulatory Visit: Payer: Self-pay

## 2021-07-12 ENCOUNTER — Ambulatory Visit (INDEPENDENT_AMBULATORY_CARE_PROVIDER_SITE_OTHER): Payer: Medicare Other | Admitting: Nurse Practitioner

## 2021-07-12 ENCOUNTER — Encounter: Payer: Self-pay | Admitting: Nurse Practitioner

## 2021-07-12 VITALS — BP 128/76 | HR 83 | Temp 98.7°F | Resp 16 | Ht 69.0 in | Wt 229.8 lb

## 2021-07-12 DIAGNOSIS — E1165 Type 2 diabetes mellitus with hyperglycemia: Secondary | ICD-10-CM | POA: Diagnosis not present

## 2021-07-12 DIAGNOSIS — I10 Essential (primary) hypertension: Secondary | ICD-10-CM | POA: Diagnosis not present

## 2021-07-12 DIAGNOSIS — J439 Emphysema, unspecified: Secondary | ICD-10-CM | POA: Diagnosis not present

## 2021-07-12 DIAGNOSIS — Z6833 Body mass index (BMI) 33.0-33.9, adult: Secondary | ICD-10-CM

## 2021-07-12 DIAGNOSIS — I700 Atherosclerosis of aorta: Secondary | ICD-10-CM | POA: Diagnosis not present

## 2021-07-12 DIAGNOSIS — K219 Gastro-esophageal reflux disease without esophagitis: Secondary | ICD-10-CM

## 2021-07-12 DIAGNOSIS — E6609 Other obesity due to excess calories: Secondary | ICD-10-CM

## 2021-07-12 MED ORDER — FREESTYLE LIBRE 2 SENSOR MISC: 1.0000 | 3 refills | Status: DC

## 2021-07-12 MED ORDER — ALBUTEROL SULFATE HFA 108 (90 BASE) MCG/ACT IN AERS: 2.0000 | INHALATION_SPRAY | Freq: Four times a day (QID) | RESPIRATORY_TRACT | 5 refills | Status: DC | PRN

## 2021-07-12 MED ORDER — METOPROLOL TARTRATE 100 MG PO TABS: 100.0000 mg | ORAL_TABLET | Freq: Two times a day (BID) | ORAL | 1 refills | Status: DC

## 2021-07-12 MED ORDER — EZETIMIBE 10 MG PO TABS: 10.0000 mg | ORAL_TABLET | Freq: Every day | ORAL | 2 refills | Status: DC

## 2021-07-12 MED ORDER — DULAGLUTIDE 4.5 MG/0.5ML ~~LOC~~ SOAJ: 4.5000 mg | SUBCUTANEOUS | 3 refills | Status: DC

## 2021-07-12 MED ORDER — BREZTRI AEROSPHERE 160-9-4.8 MCG/ACT IN AERO: 2.0000 | INHALATION_SPRAY | Freq: Two times a day (BID) | RESPIRATORY_TRACT | 11 refills | Status: DC

## 2021-07-12 MED ORDER — TOUJEO MAX SOLOSTAR 300 UNIT/ML ~~LOC~~ SOPN: 60.0000 [IU] | PEN_INJECTOR | Freq: Every day | SUBCUTANEOUS | 3 refills | Status: DC

## 2021-07-12 MED ORDER — DAPAGLIFLOZIN PROPANEDIOL 10 MG PO TABS: 10.0000 mg | ORAL_TABLET | Freq: Every day | ORAL | 2 refills | Status: DC

## 2021-07-12 MED ORDER — ATORVASTATIN CALCIUM 40 MG PO TABS: 40.0000 mg | ORAL_TABLET | Freq: Every day | ORAL | 3 refills | Status: DC

## 2021-07-12 MED ORDER — PANTOPRAZOLE SODIUM 40 MG PO TBEC: 40.0000 mg | DELAYED_RELEASE_TABLET | Freq: Every day | ORAL | 1 refills | Status: DC

## 2021-07-12 MED ORDER — FAMOTIDINE 20 MG PO TABS: 20.0000 mg | ORAL_TABLET | Freq: Two times a day (BID) | ORAL | 1 refills | Status: DC

## 2021-07-12 NOTE — Progress Notes (Signed)
[facility] ?[address] ?[place], [place] [address] ? ?Internal MEDICINE  ?Office Visit Note ? ?Patient Name: Raymond [name] ? [id number]  ?[id number] ? ?Date of Service: 07/12/2021 ? ?Chief Complaint  ?Patient presents with  ? Follow-up  ?  Diabetic shoes request, sores in back of throat over the past 2 weeks but it is feeling better now  ? Depression  ? Diabetes  ? Gastroesophageal Reflux  ? Hyperlipidemia  ? Hypertension  ? COPD  ? Anxiety  ? ? ?HPI ?Madoc presents for a follow-up visit for diabetes, hypertension, COPD and anxiety.  His A1c is 8.4 today which is up from 7.7 in December.  He reports that he has been drinking sweet tea and regular Gatorade instead of water lately.  He reports that he cannot drink tap water due to the water is not fit for drinking where he lives.  He states that he has to drink bottled water and has to be cold or iced and he often forgets to put them and fridge. ?He does not have a freezer to make ice per patient report he also reports that the sugar-free drink mixes make him have acid reflux and he cannot do those all the time.  His current dose of Toujeo 60 units daily and he is taking Trulicity 3 mg weekly.  He needs multiple refills today of his medications.  He also was seen by the podiatrist and they will send a forearm through fax to be filled out for him to get diabetic shoes. ?--Patient was referred to ophthalmology and podiatry at his previous office visit with Dr. Clayborn Bigness in January.  Patient reports that his toe is feeling better since his previous office visit where he was seen as a follow-up after going to the emergency department when he hit his toe on the wall.  He was started on gabapentin by Dr. Humphrey Rolls and states that it is helping some ?--Patient has lost 4 pounds since his previous office visit in January. ? ?Current Medication: ?Outpatient Encounter Medications as of 07/12/2021  ?Medication Sig  ? ACCU-CHEK FASTCLIX LANCETS MISC yse as directed  twice a day  ? albuterol (VENTOLIN HFA) 108 (90 Base) MCG/ACT inhaler Inhale 2 puffs into the lungs every 6 (six) hours as needed for wheezing or shortness of breath.  ? aspirin EC 81 MG tablet Take 81 mg by mouth daily at 3 pm.   ? buPROPion (WELLBUTRIN XL) 150 MG 24 hr tablet Take 1 tablet (150 mg total) by mouth daily. Take along with 300 mg daily  ? buPROPion (WELLBUTRIN XL) 300 MG 24 hr tablet Take 1 tablet (300 mg total) by mouth daily.  ? Continuous Blood Gluc Sensor (FREESTYLE LIBRE 2 SENSOR) MISC 1 Device by Does not apply route every 14 (fourteen) days.  ? Dulaglutide 4.5 MG/0.5ML SOPN Inject 4.5 mg into the skin once a week.  ? DULoxetine (CYMBALTA) 60 MG capsule Take 1 capsule (60 mg total) by mouth daily.  ? gabapentin (NEURONTIN) 100 MG capsule One tab in am and 2 in pm,after one week take 2 tab in am and 3 in pm  ? glucose blood (ACCU-CHEK AVIVA PLUS) test strip Blood sugar testing TID and as needed. E11.65  ? hydrOXYzine (VISTARIL) 25 MG capsule Take 1-2 capsules (25-50 mg total) by mouth at bedtime as needed.  ? Insulin Pen Needle 32G X 4 MM MISC Use with daily dose insulin and with sliding scale insulin as needed.  ? MELATONIN PO  Take 10 mg by mouth daily.  ? nitroGLYCERIN (NITROSTAT) 0.4 MG SL tablet Place 1 tablet (0.4 mg total) under the tongue every 5 (five) minutes x 3 doses as needed for chest pain.  ? polyethylene glycol (MIRALAX) 17 g packet Take 17 g by mouth daily.  ? pregabalin (LYRICA) 75 MG capsule Take 1 capsule (75 mg total) by mouth 2 (two) times daily.  ? temazepam (RESTORIL) 30 MG capsule Take 1 capsule (30 mg total) by mouth at bedtime as needed for sleep.  ? [DISCONTINUED] albuterol (PROAIR HFA) 108 (90 Base) MCG/ACT inhaler Inhale 2-4 puffs by mouth Every 4-6 hours as needed for wheezing, cough, and/or shortness of breath  ? [DISCONTINUED] atorvastatin (LIPITOR) 40 MG tablet Take 1 tablet (40 mg total) by mouth daily.  ? [DISCONTINUED] Budeson-Glycopyrrol-Formoterol (BREZTRI  AEROSPHERE) 160-9-4.8 MCG/ACT AERO Inhale 2 puffs into the lungs 2 (two) times daily.  ? [DISCONTINUED] Continuous Blood Gluc Sensor (FREESTYLE LIBRE 2 SENSOR) MISC 1 Device by Does not apply route every 14 (fourteen) days.  ? [DISCONTINUED] dapagliflozin propanediol (FARXIGA) 10 MG TABS tablet Take 1 tablet (10 mg total) by mouth daily before breakfast.  ? [DISCONTINUED] Dulaglutide 3 MG/0.5ML SOPN Inject 3 mg into the skin once a week.  ? [DISCONTINUED] ezetimibe (ZETIA) 10 MG tablet Take 1 tablet (10 mg total) by mouth daily.  ? [DISCONTINUED] famotidine (PEPCID) 20 MG tablet Take 1 tablet (20 mg total) by mouth 2 (two) times daily.  ? [DISCONTINUED] insulin glargine, 2 Unit Dial, (TOUJEO MAX SOLOSTAR) 300 UNIT/ML Solostar Pen Inject 50 Units into the skin daily. May decrease to 40 units at bedtime if having episodes of hypoglycemia.  ? [DISCONTINUED] metoprolol tartrate (LOPRESSOR) 100 MG tablet Take 1 tablet (100 mg total) by mouth 2 (two) times daily.  ? [DISCONTINUED] pantoprazole (PROTONIX) 40 MG tablet Take 1 tablet (40 mg total) by mouth daily.  ? atorvastatin (LIPITOR) 40 MG tablet Take 1 tablet (40 mg total) by mouth daily.  ? Budeson-Glycopyrrol-Formoterol (BREZTRI AEROSPHERE) 160-9-4.8 MCG/ACT AERO Inhale 2 puffs into the lungs 2 (two) times daily.  ? colestipol (COLESTID) 1 g tablet Take 2 tablets (2 g total) by mouth daily for 30 doses.  ? dapagliflozin propanediol (FARXIGA) 10 MG TABS tablet Take 1 tablet (10 mg total) by mouth daily before breakfast.  ? ezetimibe (ZETIA) 10 MG tablet Take 1 tablet (10 mg total) by mouth daily.  ? famotidine (PEPCID) 20 MG tablet Take 1 tablet (20 mg total) by mouth 2 (two) times daily.  ? insulin glargine, 2 Unit Dial, (TOUJEO MAX SOLOSTAR) 300 UNIT/ML Solostar Pen Inject 60 Units into the skin daily. May decrease to 40 units at bedtime if having episodes of hypoglycemia.  ? metoprolol tartrate (LOPRESSOR) 100 MG tablet Take 1 tablet (100 mg total) by mouth 2 (two)  times daily.  ? pantoprazole (PROTONIX) 40 MG tablet Take 1 tablet (40 mg total) by mouth daily.  ? ?No facility-administered encounter medications on file as of 07/12/2021.  ? ? ?Surgical History: ?Past Surgical History:  ?Procedure Laterality Date  ?  ingrown toenail removal    ? CHOLECYSTECTOMY N/A 01/23/2019  ? Procedure: LAPAROSCOPIC CHOLECYSTECTOMY;  Surgeon: Olean Ree, MD;  Location: [facility] ORS;  Service: General;  Laterality: N/A;  ? CLAVICLE SURGERY Right   ? COLONOSCOPY  2017  ? COLONOSCOPY WITH PROPOFOL N/A 08/25/2020  ? Procedure: COLONOSCOPY WITH PROPOFOL;  Surgeon: Virgel Manifold, MD;  Location: [facility] END[facility];  Service: Endoscopy;  Laterality: N/A;  ? ESOPHAGOGASTRODUODENOSCOPY (  EGD) WITH PROPOFOL N/A 08/25/2020  ? Procedure: ESOPHAGOGASTRODUODENOSCOPY (EGD) WITH PROPOFOL;  Surgeon: Virgel Manifold, MD;  Location: [facility] END[facility];  Service: Endoscopy;  Laterality: N/A;  ? MOUTH SURGERY    ? REMOVED ALL TEETH  ? SHOULDER SURGERY Right   ? UPPER GI ENDOSCOPY  2017  ? ? ?Medical History: ?Past Medical History:  ?Diagnosis Date  ? Anxiety   ? Arthritis   ? NECK  ? Bronchitis   ? Coronary artery disease   ? Depression   ? Diabetes mellitus without complication ([place])   ? Diastasis of rectus abdominis 06/19/2018  ? Dyspnea   ? DUE TO GALLBLADDER PER PT  ? Emphysema lung ([place])   ? Family history of adverse reaction to anesthesia   ? N/V, TROUBLE BREATHING COMING OUT OF ANESTHESIA  ? Fatty liver   ? GERD (gastroesophageal reflux disease)   ? Headache   ? MIGRAINES  ? Hyperlipidemia   ? Hypertension   ? Irregular heart beat unk  ? Myocardial infarction [facility]) 2010  ? MILD   ? PTSD (post-traumatic stress disorder)   ? Sleep apnea   ? USES CPAP  ? Tachycardia   ? ? ?Family History: ?Family History  ?Problem Relation Age of Onset  ? Hypertension Mother   ? Diabetes type II Mother   ? Diabetes Mother   ? COPD Father   ? Cancer Father   ? Heart disease Father   ? Diabetes Sister   ? Anxiety disorder Sister   ?  Depression Sister   ? Diabetes Brother   ? Anxiety disorder Brother   ? Depression Brother   ? Diabetes Maternal Aunt   ? Diabetes Sister   ? Anxiety disorder Sister   ? Depression Sister   ? Diabetes S

## 2021-07-13 ENCOUNTER — Encounter: Payer: Self-pay | Admitting: Nurse Practitioner

## 2021-07-13 LAB — POCT GLYCOSYLATED HEMOGLOBIN (HGB A1C): Hemoglobin A1C: 8.4 % — AB (ref 4.0–5.6)

## 2021-08-15 ENCOUNTER — Other Ambulatory Visit: Payer: Self-pay

## 2021-08-15 DIAGNOSIS — E1165 Type 2 diabetes mellitus with hyperglycemia: Secondary | ICD-10-CM

## 2021-08-15 DIAGNOSIS — J439 Emphysema, unspecified: Secondary | ICD-10-CM

## 2021-08-15 MED ORDER — ALBUTEROL SULFATE 108 (90 BASE) MCG/ACT IN AEPB: INHALATION_SPRAY | RESPIRATORY_TRACT | 5 refills | Status: DC

## 2021-08-15 NOTE — Telephone Encounter (Signed)
[facility] phar called that pt insurance covered only proair generic is albuterol gave them verbal as per alyssa to change  ?

## 2021-08-18 ENCOUNTER — Other Ambulatory Visit: Payer: Self-pay

## 2021-08-18 DIAGNOSIS — Z87891 Personal history of nicotine dependence: Secondary | ICD-10-CM

## 2021-08-18 DIAGNOSIS — Z122 Encounter for screening for malignant neoplasm of respiratory organs: Secondary | ICD-10-CM

## 2021-08-18 DIAGNOSIS — F17200 Nicotine dependence, unspecified, uncomplicated: Secondary | ICD-10-CM

## 2021-08-23 ENCOUNTER — Telehealth (INDEPENDENT_AMBULATORY_CARE_PROVIDER_SITE_OTHER): Payer: Medicare Other | Admitting: Psychiatry

## 2021-08-23 DIAGNOSIS — F3342 Major depressive disorder, recurrent, in full remission: Secondary | ICD-10-CM

## 2021-08-23 NOTE — Progress Notes (Signed)
Patient unable to connect at the time of appointment due to connection problem.  We will have to staff to call back to reschedule this appointment. ?

## 2021-08-24 ENCOUNTER — Telehealth (INDEPENDENT_AMBULATORY_CARE_PROVIDER_SITE_OTHER): Payer: Medicare Other | Admitting: Psychiatry

## 2021-08-24 ENCOUNTER — Encounter: Payer: Self-pay | Admitting: Psychiatry

## 2021-08-24 DIAGNOSIS — F4310 Post-traumatic stress disorder, unspecified: Secondary | ICD-10-CM

## 2021-08-24 DIAGNOSIS — F17200 Nicotine dependence, unspecified, uncomplicated: Secondary | ICD-10-CM

## 2021-08-24 DIAGNOSIS — G4701 Insomnia due to medical condition: Secondary | ICD-10-CM | POA: Diagnosis not present

## 2021-08-24 DIAGNOSIS — F3342 Major depressive disorder, recurrent, in full remission: Secondary | ICD-10-CM

## 2021-08-24 MED ORDER — BUPROPION HCL ER (XL) 300 MG PO TB24: 300.0000 mg | ORAL_TABLET | Freq: Every day | ORAL | 1 refills | Status: DC

## 2021-08-24 MED ORDER — BUPROPION HCL ER (XL) 150 MG PO TB24: 150.0000 mg | ORAL_TABLET | Freq: Every day | ORAL | 1 refills | Status: DC

## 2021-08-24 MED ORDER — HYDROXYZINE PAMOATE 25 MG PO CAPS
25.0000 mg | ORAL_CAPSULE | Freq: Every evening | ORAL | 3 refills | Status: DC | PRN
Start: 2021-08-24 — End: 2022-10-20

## 2021-08-24 MED ORDER — DULOXETINE HCL 60 MG PO CPEP: 60.0000 mg | ORAL_CAPSULE | Freq: Every day | ORAL | 1 refills | Status: DC

## 2021-08-24 MED ORDER — TEMAZEPAM 30 MG PO CAPS: 30.0000 mg | ORAL_CAPSULE | Freq: Every evening | ORAL | 3 refills | Status: DC | PRN

## 2021-08-24 NOTE — Progress Notes (Signed)
Virtual Visit via Video Note ? ?I connected with Raymond [name] on 08/24/21 at 11:20 AM EDT by a video enabled telemedicine application and verified that I am speaking with the correct person using two identifiers. ? ?Location ?Provider Location : [facility] ?Patient Location : Home ? ?Participants: Patient , Provider ? ?  ?I discussed the limitations of evaluation and management by telemedicine and the availability of in person appointments. The patient expressed understanding and agreed to proceed. ?  ?I discussed the assessment and treatment plan with the patient. The patient was provided an opportunity to ask questions and all were answered. The patient agreed with the plan and demonstrated an understanding of the instructions. ?  ?The patient was advised to call back or seek an in-person evaluation if the symptoms worsen or if the condition fails to improve as anticipated. ? ? ? ?[facility] MD OP Progress Note ? ?08/24/2021 2:59 PM ?Raymond [name]  ?MRN:  [id number] ? ?Chief Complaint:  ?Chief Complaint  ?Patient presents with  ? Follow-up: 56 year old male, has a history of MDD, PTSD, tobacco use disorder, insomnia, presented for medication management.  ? ?HPI: Raymond [name] is a 56 year old male, divorced, has a history of MDD, PTSD, tobacco use disorder, uncontrolled diabetes, hyperlipidemia, hepatic steatosis, lives in [name] was evaluated by telemedicine today. ? ?Patient today reports overall he is currently doing well with regards to his mood.  Denies any significant sadness.  Able to cope with anxiety better than before. ? ?Patient reports he sleeps around 6 to 7 hours at night. ? ?Currently compliant on sleep medication.  Currently compliant on his other psychotropics.  Denies side effects. ? ?Continues to smoke cigarettes, interested in quitting.  Provided counseling.  Receptive to counseling.  Reports he has cut back. ? ?Denies suicidality, homicidality or perceptual disturbances. ? ?Does report hip  joint pain-left-sided, agrees to follow up with primary provider. ? ?Denies any other concerns today. ? ?Visit Diagnosis:  ?  ICD-10-CM   ?1. MDD (major depressive disorder), recurrent, in full remission ([place])  F33.42 temazepam (RESTORIL) 30 MG capsule  ?  buPROPion (WELLBUTRIN XL) 150 MG 24 hr tablet  ?  ?2. PTSD (post-traumatic stress disorder)  F43.10 temazepam (RESTORIL) 30 MG capsule  ?  hydrOXYzine (VISTARIL) 25 MG capsule  ?  DULoxetine (CYMBALTA) 60 MG capsule  ?  ?3. Insomnia due to medical condition  G47.01   ? OSA mood  ?  ?4. Tobacco use disorder  F17.200 buPROPion (WELLBUTRIN XL) 300 MG 24 hr tablet  ?  ? ? ?Past Psychiatric History: Reviewed past psychiatric history from progress note on 05/23/2018.  Past trials of Tegretol, doxepin, Klonopin, Seroquel, mirtazapine, Belsomra, Lunesta, Ativan, risperidone, Lexapro. ? ?Past Medical History:  ?Past Medical History:  ?Diagnosis Date  ? Anxiety   ? Arthritis   ? NECK  ? Bronchitis   ? Coronary artery disease   ? Depression   ? Diabetes mellitus without complication ([place])   ? Diastasis of rectus abdominis 06/19/2018  ? Dyspnea   ? DUE TO GALLBLADDER PER PT  ? Emphysema lung ([place])   ? Family history of adverse reaction to anesthesia   ? N/V, TROUBLE BREATHING COMING OUT OF ANESTHESIA  ? Fatty liver   ? GERD (gastroesophageal reflux disease)   ? Headache   ? MIGRAINES  ? Hyperlipidemia   ? Hypertension   ? Irregular heart beat unk  ? Myocardial infarction [facility]) 2010  ? MILD   ? PTSD (post-traumatic stress disorder)   ?  Sleep apnea   ? USES CPAP  ? Tachycardia   ?  ?Past Surgical History:  ?Procedure Laterality Date  ?  ingrown toenail removal    ? CHOLECYSTECTOMY N/A 01/23/2019  ? Procedure: LAPAROSCOPIC CHOLECYSTECTOMY;  Surgeon: Olean Ree, MD;  Location: [facility] OR[facility];  Service: General;  Laterality: N/A;  ? CLAVICLE SURGERY Right   ? COLONOSCOPY  2017  ? COLONOSCOPY WITH PROPOFOL N/A 08/25/2020  ? Procedure: COLONOSCOPY WITH PROPOFOL;  Surgeon: Virgel Manifold, MD;  Location: [facility];  Service: Endoscopy;  Laterality: N/A;  ? ESOPHAGOGASTRODUODENOSCOPY (EGD) WITH PROPOFOL N/A 08/25/2020  ? Procedure: ESOPHAGOGASTRODUODENOSCOPY (EGD) WITH PROPOFOL;  Surgeon: Virgel Manifold, MD;  Location: [facility];  Service: Endoscopy;  Laterality: N/A;  ? MOUTH SURGERY    ? REMOVED ALL TEETH  ? SHOULDER SURGERY Right   ? UPPER GI ENDOSCOPY  2017  ? ? ?Family Psychiatric History: Reviewed family psychiatric history from progress note on 05/23/2018. ? ?Family History:  ?Family History  ?Problem Relation Age of Onset  ? Hypertension Mother   ? Diabetes type II Mother   ? Diabetes Mother   ? COPD Father   ? Cancer Father   ? Heart disease Father   ? Diabetes Sister   ? Anxiety disorder Sister   ? Depression Sister   ? Diabetes Brother   ? Anxiety disorder Brother   ? Depression Brother   ? Diabetes Maternal Aunt   ? Diabetes Sister   ? Anxiety disorder Sister   ? Depression Sister   ? Diabetes Sister   ? Anxiety disorder Sister   ? Depression Sister   ? Diabetes Sister   ? Anxiety disorder Sister   ? Depression Sister   ? Colon cancer Maternal Uncle   ? ? ?Social History: Reviewed social history from progress note on 05/23/2018. ?Social History  ? ?Socioeconomic History  ? Marital status: Divorced  ?  Spouse name: Not on file  ? Number of children: 4  ? Years of education: Not on file  ? Highest education level: 8th grade  ?Occupational History  ? Not on file  ?Tobacco Use  ? Smoking status: Every Day  ?  Packs/day: 1.50  ?  Years: 40.00  ?  Pack years: 60.00  ?  Types: Cigarettes  ? Smokeless tobacco: Former  ?  Types: Chew  ? Tobacco comments:  ?  1 pack per day reported 01/03/2021  ?Vaping Use  ? Vaping Use: Former  ?Substance and Sexual Activity  ? Alcohol use: No  ?  Alcohol/week: 0.0 standard drinks  ? Drug use: No  ? Sexual activity: Not Currently  ?Other Topics Concern  ? Not on file  ?Social History Narrative  ? Not on file  ? ?Social Determinants of Health   ? ?Financial Resource Strain: Not on file  ?Food Insecurity: Not on file  ?Transportation Needs: Not on file  ?Physical Activity: Not on file  ?Stress: Not on file  ?Social Connections: Not on file  ? ? ?Allergies:  ?Allergies  ?Allergen Reactions  ? Onion Anaphylaxis  ? Hydrocodone   ? Nicoderm [Nicotine]   ? Norco [Hydrocodone-Acetaminophen] Itching  ? Hydrocodone-Acetaminophen Itching  ? Nickel Rash  ? ? ?Metabolic Disorder Labs: ?Lab Results  ?Component Value Date  ? HGBA1C 8.4 (A) 07/13/2021  ? MPG 294.83 05/31/2017  ? ?No results found for: PROLACTIN ?Lab Results  ?Component Value Date  ? CHOL 280 (H) 08/17/2020  ? TRIG 501 (H) 08/17/2020  ?  HDL 31 (L) 08/17/2020  ? CHOLHDL 4.8 07/15/2015  ? VLDL 26 12/09/2012  ? LDLCALC 153 (H) 08/17/2020  ? Damar 61 07/15/2015  ? ?Lab Results  ?Component Value Date  ? TSH 1.890 08/17/2020  ? TSH 4.120 06/10/2018  ? ? ?Therapeutic Level Labs: ?No results found for: LITHIUM ?No results found for: VALPROATE ?No components found for:  CBMZ ? ?Current Medications: ?Current Outpatient Medications  ?Medication Sig Dispense Refill  ? ACCU-CHEK FASTCLIX LANCETS MISC yse as directed twice a day 100 each 1  ? albuterol (VENTOLIN HFA) 108 (90 Base) MCG/ACT inhaler Inhale 2 puffs into the lungs every 6 (six) hours as needed.    ? Albuterol Sulfate (PROAIR RESPICLICK) 662 (90 Base) MCG/ACT AEPB Inhale 2 puff by po every  hrs as needed for wheezing for SOB 1 each 5  ? aspirin EC 81 MG tablet Take 81 mg by mouth daily at 3 pm.     ? atorvastatin (LIPITOR) 40 MG tablet Take 1 tablet (40 mg total) by mouth daily. 90 tablet 3  ? Budeson-Glycopyrrol-Formoterol (BREZTRI AEROSPHERE) 160-9-4.8 MCG/ACT AERO Inhale 2 puffs into the lungs 2 (two) times daily. 10.7 g 11  ? buPROPion (WELLBUTRIN XL) 150 MG 24 hr tablet Take 1 tablet (150 mg total) by mouth daily. Take along with 300 mg daily 90 tablet 1  ? buPROPion (WELLBUTRIN XL) 300 MG 24 hr tablet Take 1 tablet (300 mg total) by mouth daily. 90  tablet 1  ? colestipol (COLESTID) 1 g tablet Take 2 tablets (2 g total) by mouth daily for 30 doses. 60 tablet 2  ? Continuous Blood Gluc Sensor (FREESTYLE LIBRE 2 SENSOR) MISC 1 Device by Does not apply route e

## 2021-08-29 ENCOUNTER — Other Ambulatory Visit: Payer: Self-pay

## 2021-08-29 ENCOUNTER — Ambulatory Visit
Admission: RE | Admit: 2021-08-29 | Discharge: 2021-08-29 | Disposition: A | Discharge status: Home / Self Care | Payer: Medicare Other | Source: Ambulatory Visit | Attending: Acute Care | Admitting: Acute Care

## 2021-08-29 DIAGNOSIS — F17200 Nicotine dependence, unspecified, uncomplicated: Secondary | ICD-10-CM | POA: Insufficient documentation

## 2021-08-29 DIAGNOSIS — Z87891 Personal history of nicotine dependence: Secondary | ICD-10-CM | POA: Diagnosis not present

## 2021-08-29 DIAGNOSIS — Z122 Encounter for screening for malignant neoplasm of respiratory organs: Secondary | ICD-10-CM | POA: Diagnosis not present

## 2021-08-29 DIAGNOSIS — F17210 Nicotine dependence, cigarettes, uncomplicated: Secondary | ICD-10-CM | POA: Diagnosis not present

## 2021-08-31 ENCOUNTER — Other Ambulatory Visit: Payer: Self-pay | Admitting: Acute Care

## 2021-08-31 DIAGNOSIS — Z122 Encounter for screening for malignant neoplasm of respiratory organs: Secondary | ICD-10-CM

## 2021-08-31 DIAGNOSIS — Z87891 Personal history of nicotine dependence: Secondary | ICD-10-CM

## 2021-08-31 DIAGNOSIS — F17210 Nicotine dependence, cigarettes, uncomplicated: Secondary | ICD-10-CM

## 2021-09-07 ENCOUNTER — Telehealth: Payer: Medicare Other

## 2021-09-08 ENCOUNTER — Telehealth: Payer: Self-pay

## 2021-09-08 NOTE — Telephone Encounter (Signed)
CMN submitted ?

## 2021-09-15 ENCOUNTER — Telehealth: Payer: Self-pay

## 2021-09-15 NOTE — Telephone Encounter (Signed)
Faxed patient's therapeutic shoes form to [phone] on 09/15/2021.

## 2021-09-22 ENCOUNTER — Telehealth: Payer: Self-pay

## 2021-09-22 NOTE — Telephone Encounter (Signed)
CMN Received - Casts released and shoes ordered  [employer] Blue [phone]

## 2021-09-27 ENCOUNTER — Encounter: Payer: Self-pay | Admitting: Nurse Practitioner

## 2021-10-19 ENCOUNTER — Ambulatory Visit (INDEPENDENT_AMBULATORY_CARE_PROVIDER_SITE_OTHER): Payer: Medicare Other | Admitting: Nurse Practitioner

## 2021-10-19 ENCOUNTER — Encounter: Payer: Self-pay | Admitting: Nurse Practitioner

## 2021-10-19 ENCOUNTER — Telehealth: Payer: Self-pay

## 2021-10-19 VITALS — BP 129/87 | HR 98 | Temp 98.9°F | Resp 16 | Ht 69.0 in | Wt 229.6 lb

## 2021-10-19 DIAGNOSIS — K219 Gastro-esophageal reflux disease without esophagitis: Secondary | ICD-10-CM

## 2021-10-19 DIAGNOSIS — E1165 Type 2 diabetes mellitus with hyperglycemia: Secondary | ICD-10-CM | POA: Diagnosis not present

## 2021-10-19 DIAGNOSIS — I700 Atherosclerosis of aorta: Secondary | ICD-10-CM | POA: Diagnosis not present

## 2021-10-19 DIAGNOSIS — G8929 Other chronic pain: Secondary | ICD-10-CM | POA: Diagnosis not present

## 2021-10-19 DIAGNOSIS — R300 Dysuria: Secondary | ICD-10-CM | POA: Diagnosis not present

## 2021-10-19 DIAGNOSIS — M25552 Pain in left hip: Secondary | ICD-10-CM | POA: Diagnosis not present

## 2021-10-19 DIAGNOSIS — E1140 Type 2 diabetes mellitus with diabetic neuropathy, unspecified: Secondary | ICD-10-CM

## 2021-10-19 DIAGNOSIS — E559 Vitamin D deficiency, unspecified: Secondary | ICD-10-CM

## 2021-10-19 DIAGNOSIS — Z0001 Encounter for general adult medical examination with abnormal findings: Secondary | ICD-10-CM | POA: Diagnosis not present

## 2021-10-19 DIAGNOSIS — I209 Angina pectoris, unspecified: Secondary | ICD-10-CM

## 2021-10-19 DIAGNOSIS — Z760 Encounter for issue of repeat prescription: Secondary | ICD-10-CM

## 2021-10-19 DIAGNOSIS — I10 Essential (primary) hypertension: Secondary | ICD-10-CM

## 2021-10-19 DIAGNOSIS — M25562 Pain in left knee: Secondary | ICD-10-CM

## 2021-10-19 LAB — POCT GLYCOSYLATED HEMOGLOBIN (HGB A1C): Hemoglobin A1C: 8 % — AB (ref 4.0–5.6)

## 2021-10-19 MED ORDER — GABAPENTIN 600 MG PO TABS: 300.0000 mg | ORAL_TABLET | Freq: Two times a day (BID) | ORAL | 3 refills | Status: DC

## 2021-10-19 MED ORDER — EZETIMIBE 10 MG PO TABS: 10.0000 mg | ORAL_TABLET | Freq: Every day | ORAL | 3 refills | Status: DC

## 2021-10-19 MED ORDER — PANTOPRAZOLE SODIUM 40 MG PO TBEC: 40.0000 mg | DELAYED_RELEASE_TABLET | Freq: Every day | ORAL | 1 refills | Status: DC

## 2021-10-19 MED ORDER — DAPAGLIFLOZIN PROPANEDIOL 10 MG PO TABS: 10.0000 mg | ORAL_TABLET | Freq: Every day | ORAL | 2 refills | Status: DC

## 2021-10-19 MED ORDER — DULAGLUTIDE 4.5 MG/0.5ML ~~LOC~~ SOAJ: 4.5000 mg | SUBCUTANEOUS | 3 refills | Status: DC

## 2021-10-19 MED ORDER — METOPROLOL TARTRATE 100 MG PO TABS: 100.0000 mg | ORAL_TABLET | Freq: Two times a day (BID) | ORAL | 1 refills | Status: DC

## 2021-10-19 MED ORDER — NITROGLYCERIN 0.4 MG SL SUBL: 0.4000 mg | SUBLINGUAL_TABLET | SUBLINGUAL | 1 refills | Status: AC | PRN

## 2021-10-19 MED ORDER — FAMOTIDINE 20 MG PO TABS: 20.0000 mg | ORAL_TABLET | Freq: Two times a day (BID) | ORAL | 1 refills | Status: DC

## 2021-10-19 NOTE — Progress Notes (Signed)
[facility] [place], [place] [place] [employer] [address]  Internal MEDICINE  Office Visit Note  Patient Name: Raymond [name]  [id number]  [id number]  Date of Service: 10/19/2021  Chief Complaint  Patient presents with   Medicare Wellness    Pain is left knee and hip, hip just started about a month ago, both have been getting worse     HPI Raymond [name] presents for an annual well visit and physical exam.  --well-appearing 57 yo male with diabetes, hypertension, emphysema, GERD, GAD and depression.  --a1c is 8.0 today which is improved from 8.4 in march this year.  --followed by Dr. Shea Evans for GAD, depression and PTSD.  ----routine colonoscopy due in 2025, diabetic foot exam due today. Diabetic eye exam was done in march this year. PSA was normal at 1.0 in April 2022.  --current everyday smoker approximately 1 ppd for 40 years.  --April 2022, ct chest  lung-RADS 2 benign appearance--scattered small solid pulmonary nodules, the largest is 4.5 mm in diameter.  --routine labs are due.   --continues to use breztri for COPD and emphysema. . This is working well. Rarely uses rescue inhaler.   Left hip and knee pain, started 1 month ago, has been getting progressively worse.     Current Medication: Outpatient Encounter Medications as of 10/19/2021  Medication Sig   ACCU-CHEK FASTCLIX LANCETS MISC yse as directed twice a day   albuterol (VENTOLIN HFA) 108 (90 Base) MCG/ACT inhaler Inhale 2 puffs into the lungs every 6 (six) hours as needed.   Albuterol Sulfate (PROAIR RESPICLICK) 295 (90 Base) MCG/ACT AEPB Inhale 2 puff by po every  hrs as needed for wheezing for SOB   aspirin EC 81 MG tablet Take 81 mg by mouth daily at 3 pm.    atorvastatin (LIPITOR) 40 MG tablet Take 1 tablet (40 mg total) by mouth daily.   Budeson-Glycopyrrol-Formoterol (BREZTRI AEROSPHERE) 160-9-4.8 MCG/ACT AERO Inhale 2 puffs into the lungs 2 (two) times daily.   buPROPion (WELLBUTRIN XL) 150 MG 24 hr tablet  Take 1 tablet (150 mg total) by mouth daily. Take along with 300 mg daily   buPROPion (WELLBUTRIN XL) 300 MG 24 hr tablet Take 1 tablet (300 mg total) by mouth daily.   DULoxetine (CYMBALTA) 60 MG capsule Take 1 capsule (60 mg total) by mouth daily.   gabapentin (NEURONTIN) 600 MG tablet Take 0.5 tablets (300 mg total) by mouth 2 (two) times daily. May increase to 1 whole tablet in PM after 1 week.   glucose blood (ACCU-CHEK AVIVA PLUS) test strip Blood sugar testing TID and as needed. E11.65   hydrOXYzine (VISTARIL) 25 MG capsule Take 1-2 capsules (25-50 mg total) by mouth at bedtime as needed.   insulin glargine, 2 Unit Dial, (TOUJEO MAX SOLOSTAR) 300 UNIT/ML Solostar Pen Inject 60 Units into the skin daily. May decrease to 40 units at bedtime if having episodes of hypoglycemia.   Insulin Pen Needle 32G X 4 MM MISC Use with daily dose insulin and with sliding scale insulin as needed.   MELATONIN PO Take 10 mg by mouth daily.   polyethylene glycol (MIRALAX) 17 g packet Take 17 g by mouth daily.   temazepam (RESTORIL) 30 MG capsule Take 1 capsule (30 mg total) by mouth at bedtime as needed for sleep.   [DISCONTINUED] Continuous Blood Gluc Sensor (FREESTYLE LIBRE 2 SENSOR) MISC 1 Device by Does not apply route every 14 (fourteen) days.   [DISCONTINUED] dapagliflozin propanediol (FARXIGA) 10 MG TABS tablet  Take 1 tablet (10 mg total) by mouth daily before breakfast.   [DISCONTINUED] Dulaglutide 4.5 MG/0.5ML SOPN Inject 4.5 mg into the skin once a week.   [DISCONTINUED] ezetimibe (ZETIA) 10 MG tablet Take 1 tablet (10 mg total) by mouth daily.   [DISCONTINUED] famotidine (PEPCID) 20 MG tablet Take 1 tablet (20 mg total) by mouth 2 (two) times daily.   [DISCONTINUED] gabapentin (NEURONTIN) 100 MG capsule One tab in am and 2 in pm,after one week take 2 tab in am and 3 in pm   [DISCONTINUED] metoprolol tartrate (LOPRESSOR) 100 MG tablet Take 1 tablet (100 mg total) by mouth 2 (two) times daily.    [DISCONTINUED] nitroGLYCERIN (NITROSTAT) 0.4 MG SL tablet Place 1 tablet (0.4 mg total) under the tongue every 5 (five) minutes x 3 doses as needed for chest pain.   [DISCONTINUED] pantoprazole (PROTONIX) 40 MG tablet Take 1 tablet (40 mg total) by mouth daily.   [DISCONTINUED] pregabalin (LYRICA) 75 MG capsule Take 1 capsule (75 mg total) by mouth 2 (two) times daily.   dapagliflozin propanediol (FARXIGA) 10 MG TABS tablet Take 1 tablet (10 mg total) by mouth daily before breakfast.   Dulaglutide 4.5 MG/0.5ML SOPN Inject 4.5 mg into the skin once a week.   ezetimibe (ZETIA) 10 MG tablet Take 1 tablet (10 mg total) by mouth daily.   famotidine (PEPCID) 20 MG tablet Take 1 tablet (20 mg total) by mouth 2 (two) times daily.   metoprolol tartrate (LOPRESSOR) 100 MG tablet Take 1 tablet (100 mg total) by mouth 2 (two) times daily.   nitroGLYCERIN (NITROSTAT) 0.4 MG SL tablet Place 1 tablet (0.4 mg total) under the tongue every 5 (five) minutes x 3 doses as needed for chest pain.   pantoprazole (PROTONIX) 40 MG tablet Take 1 tablet (40 mg total) by mouth daily.   [DISCONTINUED] colestipol (COLESTID) 1 g tablet Take 2 tablets (2 g total) by mouth daily for 30 doses.   No facility-administered encounter medications on file as of 10/19/2021.    Surgical History: Past Surgical History:  Procedure Laterality Date    ingrown toenail removal     CHOLECYSTECTOMY N/A 01/23/2019   Procedure: LAPAROSCOPIC CHOLECYSTECTOMY;  Surgeon: Olean Ree, MD;  Location: [facility] ORS;  Service: General;  Laterality: N/A;   CLAVICLE SURGERY Right    COLONOSCOPY  2017   COLONOSCOPY WITH PROPOFOL N/A 08/25/2020   Procedure: COLONOSCOPY WITH PROPOFOL;  Surgeon: Virgel Manifold, MD;  Location: [facility] ENDOSCOPY;  Service: Endoscopy;  Laterality: N/A;   ESOPHAGOGASTRODUODENOSCOPY (EGD) WITH PROPOFOL N/A 08/25/2020   Procedure: ESOPHAGOGASTRODUODENOSCOPY (EGD) WITH PROPOFOL;  Surgeon: Virgel Manifold, MD;  Location: [facility]  ENDOSCOPY;  Service: Endoscopy;  Laterality: N/A;   MOUTH SURGERY     REMOVED ALL TEETH   SHOULDER SURGERY Right    UPPER GI ENDOSCOPY  2017    Medical History: Past Medical History:  Diagnosis Date   Anxiety    Arthritis    NECK   Bronchitis    Coronary artery disease    Depression    Diabetes mellitus without complication (HCC)    Diastasis of rectus abdominis 06/19/2018   Dyspnea    DUE TO GALLBLADDER PER PT   Emphysema lung (HCC)    Family history of adverse reaction to anesthesia    N/V, TROUBLE BREATHING COMING OUT OF ANESTHESIA   Fatty liver    GERD (gastroesophageal reflux disease)    Headache    MIGRAINES   Hyperlipidemia    Hypertension  Irregular heart beat unk   Myocardial infarction ([place]) 2010   MILD    PTSD (post-traumatic stress disorder)    Sleep apnea    USES CPAP   Tachycardia     Family History: Family History  Problem Relation Age of Onset   Hypertension Mother    Diabetes type II Mother    Diabetes Mother    COPD Father    Cancer Father    Heart disease Father    Diabetes Sister    Anxiety disorder Sister    Depression Sister    Diabetes Brother    Anxiety disorder Brother    Depression Brother    Diabetes Maternal Aunt    Diabetes Sister    Anxiety disorder Sister    Depression Sister    Diabetes Sister    Anxiety disorder Sister    Depression Sister    Diabetes Sister    Anxiety disorder Sister    Depression Sister    Colon cancer Maternal Uncle     Social History   Socioeconomic History   Marital status: Divorced    Spouse name: Not on file   Number of children: 4   Years of education: Not on file   Highest education level: 8th grade  Occupational History   Not on file  Tobacco Use   Smoking status: Every Day    Packs/day: 1.00    Years: 40.00    Total pack years: 40.00    Types: Cigarettes   Smokeless tobacco: Former    Types: Chew   Tobacco comments:    1 pack per day reported 01/03/2021  Vaping Use    Vaping Use: Former  Substance and Sexual Activity   Alcohol use: No    Alcohol/week: 0.0 standard drinks of alcohol   Drug use: No   Sexual activity: Not Currently  Other Topics Concern   Not on file  Social History Narrative   Not on file   Social Determinants of Health   Financial Resource Strain: Low Risk  (08/17/2020)   Overall Financial Resource Strain (CARDIA)    Difficulty of Paying Living Expenses: Not very hard  Food Insecurity: Food Insecurity Present (05/23/2018)   Hunger Vital Sign    Worried About Running Out of Food in the Last Year: Often true    Ran Out of Food in the Last Year: Often true  Transportation Needs: No Transportation Needs (05/23/2018)   PRAPARE - Hydrologist (Medical): No    Lack of Transportation (Non-Medical): No  Physical Activity: Inactive (05/23/2018)   Exercise Vital Sign    Days of Exercise per Week: 0 days    Minutes of Exercise per Session: 0 min  Stress: Stress Concern Present (05/23/2018)   [place]    Feeling of Stress : Very much  Social Connections: Unknown (05/23/2018)   Social Connection and Isolation Panel [NHANES]    Frequency of Communication with Friends and Family: Not on file    Frequency of Social Gatherings with Friends and Family: Not on file    Attends Religious Services: Never    Active Member of Clubs or Organizations: No    Attends Archivist Meetings: Never    Marital Status: Divorced  Human resources officer Violence: Not At Risk (05/23/2018)   Humiliation, Afraid, Rape, and Kick questionnaire    Fear of Current or Ex-Partner: No    Emotionally Abused: No    Physically Abused:  No    Sexually Abused: No      Review of Systems  Constitutional:  Negative for activity change, appetite change, chills, fatigue, fever and unexpected weight change.  HENT: Negative.  Negative for congestion, ear pain, rhinorrhea, sore  throat and trouble swallowing.   Eyes: Negative.   Respiratory: Negative.  Negative for cough, chest tightness, shortness of breath and wheezing.   Cardiovascular: Negative.  Negative for chest pain and palpitations.  Gastrointestinal: Negative.  Negative for abdominal pain, blood in stool, constipation, diarrhea, nausea and vomiting.  Endocrine: Negative.   Genitourinary: Negative.  Negative for difficulty urinating, dysuria, frequency, hematuria and urgency.  Musculoskeletal:  Positive for arthralgias and joint swelling (left knee). Negative for back pain, myalgias and neck pain.  Skin: Negative.  Negative for rash and wound.  Allergic/Immunologic: Negative.  Negative for immunocompromised state.  Neurological: Negative.  Negative for dizziness, seizures, numbness and headaches.  Hematological: Negative.   Psychiatric/Behavioral: Negative.  Negative for behavioral problems, self-injury and suicidal ideas. The patient is not nervous/anxious.     Vital Signs: BP 129/87   Pulse 98   Temp 98.9 F (37.2 C)   Resp 16   Ht 5' 9" (1.753 m)   Wt 229 lb 9.6 oz (104.1 kg)   SpO2 97%   BMI 33.91 kg/m    Physical Exam Vitals reviewed.  Constitutional:      General: He is not in acute distress.    Appearance: Normal appearance. He is obese. He is not ill-appearing.  HENT:     Head: Normocephalic and atraumatic.     Right Ear: Tympanic membrane, ear canal and external ear normal.     Left Ear: Tympanic membrane, ear canal and external ear normal.     Nose: Nose normal. No congestion or rhinorrhea.     Mouth/Throat:     Mouth: Mucous membranes are moist.     Pharynx: Oropharynx is clear. No oropharyngeal exudate or posterior oropharyngeal erythema.  Eyes:     Extraocular Movements: Extraocular movements intact.     Conjunctiva/sclera: Conjunctivae normal.     Pupils: Pupils are equal, round, and reactive to light.  Neck:     Vascular: No carotid bruit.  Cardiovascular:     Rate and  Rhythm: Normal rate and regular rhythm.     Pulses: Normal pulses.     Heart sounds: Normal heart sounds.  Pulmonary:     Effort: Pulmonary effort is normal. No respiratory distress.     Breath sounds: Normal breath sounds. No wheezing.  Abdominal:     General: Bowel sounds are normal. There is no distension (due to constipation).     Palpations: Abdomen is soft. There is no mass.     Tenderness: There is no abdominal tenderness (reports constipation and feeling bloated). There is no guarding or rebound.     Hernia: No hernia is present.  Musculoskeletal:        General: Normal range of motion.     Cervical back: Normal range of motion and neck supple. No rigidity.     Right foot: Normal range of motion. No deformity, bunion or foot drop.     Left foot: Normal range of motion. No deformity, bunion or foot drop.     Comments: Decreased ROM left knee and left hip  Feet:     Right foot:     Protective Sensation: 6 sites tested.  4 sites sensed.     Skin integrity: Callus and dry skin present.  No ulcer, blister or erythema.     Toenail Condition: Right toenails are abnormally thick.     Left foot:     Protective Sensation: 6 sites tested.  3 sites sensed.     Skin integrity: Callus and dry skin present. No ulcer, blister or erythema.     Toenail Condition: Left toenails are abnormally thick.  Lymphadenopathy:     Cervical: No cervical adenopathy.  Skin:    General: Skin is warm and dry.     Capillary Refill: Capillary refill takes less than 2 seconds.  Neurological:     Mental Status: He is alert and oriented to person, place, and time.  Psychiatric:        Mood and Affect: Mood normal.        Behavior: Behavior normal.        Thought Content: Thought content normal.        Judgment: Judgment normal.        Assessment/Plan: 1. Encounter for general adult medical examination with abnormal findings Age-appropriate preventive screenings and vaccinations discussed, annual  physical exam completed. Routine labs for health maintenance ordered, see below. PHM updated. Diabetic foot exam done today. - POCT HgB A1C - UA/M w/rflx Culture, Routine - CBC with Differential/Platelet - CMP14+EGFR - Lipid Profile - Vitamin D (25 hydroxy) - Microscopic Examination  2. Uncontrolled type 2 diabetes mellitus with hyperglycemia (HCC) A1c 8.0, improved by 0.4 since march. Refills ordered. Routine labs ordered. Continue meds as prescribed.  - POCT HgB A1C - CBC with Differential/Platelet - CMP14+EGFR - dapagliflozin propanediol (FARXIGA) 10 MG TABS tablet; Take 1 tablet (10 mg total) by mouth daily before breakfast.  Dispense: 90 tablet; Refill: 2 - Dulaglutide 4.5 MG/0.5ML SOPN; Inject 4.5 mg into the skin once a week.  Dispense: 2 mL; Refill: 3  3. Chronic pain of left knee Patient requesting referral for chronic knee pain - Ambulatory referral to Orthopedic Surgery  4. Acute hip pain, left New pain in left hip, patient wanting to see ortho for chronic knee pain, and would also like to have his left hip evaluated which has become progressively more painful . - Ambulatory referral to Orthopedic Surgery  5. Aortic atherosclerosis (HCC) Continue medications as prescribed, repeat labs - CBC with Differential/Platelet - CMP14+EGFR - Lipid Profile - ezetimibe (ZETIA) 10 MG tablet; Take 1 tablet (10 mg total) by mouth daily.  Dispense: 90 tablet; Refill: 3  6. Vitamin D deficiency - Vitamin D (25 hydroxy)  7. Dysuria - UA/M w/rflx Culture, Routine - Microscopic Examination  8. Medication refill - dapagliflozin propanediol (FARXIGA) 10 MG TABS tablet; Take 1 tablet (10 mg total) by mouth daily before breakfast.  Dispense: 90 tablet; Refill: 2 - Dulaglutide 4.5 MG/0.5ML SOPN; Inject 4.5 mg into the skin once a week.  Dispense: 2 mL; Refill: 3 - ezetimibe (ZETIA) 10 MG tablet; Take 1 tablet (10 mg total) by mouth daily.  Dispense: 90 tablet; Refill: 3 - gabapentin  (NEURONTIN) 600 MG tablet; Take 0.5 tablets (300 mg total) by mouth 2 (two) times daily. May increase to 1 whole tablet in PM after 1 week.  Dispense: 60 tablet; Refill: 3 - famotidine (PEPCID) 20 MG tablet; Take 1 tablet (20 mg total) by mouth 2 (two) times daily.  Dispense: 180 tablet; Refill: 1 - metoprolol tartrate (LOPRESSOR) 100 MG tablet; Take 1 tablet (100 mg total) by mouth 2 (two) times daily.  Dispense: 180 tablet; Refill: 1 - nitroGLYCERIN (NITROSTAT) 0.4 MG SL tablet; Place  1 tablet (0.4 mg total) under the tongue every 5 (five) minutes x 3 doses as needed for chest pain.  Dispense: 30 tablet; Refill: 1 - pantoprazole (PROTONIX) 40 MG tablet; Take 1 tablet (40 mg total) by mouth daily.  Dispense: 90 tablet; Refill: 1      General Counseling: Khaleel verbalizes understanding of the findings of todays visit and agrees with plan of treatment. I have discussed any further diagnostic evaluation that may be needed or ordered today. We also reviewed his medications today. he has been encouraged to call the office with any questions or concerns that should arise related to todays visit.    Orders Placed This Encounter  Procedures   Microscopic Examination   UA/M w/rflx Culture, Routine   CBC with Differential/Platelet   CMP14+EGFR   Lipid Profile   Vitamin D (25 hydroxy)   Ambulatory referral to Orthopedic Surgery   POCT HgB A1C    Meds ordered this encounter  Medications   dapagliflozin propanediol (FARXIGA) 10 MG TABS tablet    Sig: Take 1 tablet (10 mg total) by mouth daily before breakfast.    Dispense:  90 tablet    Refill:  2   Dulaglutide 4.5 MG/0.5ML SOPN    Sig: Inject 4.5 mg into the skin once a week.    Dispense:  2 mL    Refill:  3    Discontinue trulicity 41m injectable pen, dose increased to 4.5 mg weekly. Please fill today.   ezetimibe (ZETIA) 10 MG tablet    Sig: Take 1 tablet (10 mg total) by mouth daily.    Dispense:  90 tablet    Refill:  3    gabapentin (NEURONTIN) 600 MG tablet    Sig: Take 0.5 tablets (300 mg total) by mouth 2 (two) times daily. May increase to 1 whole tablet in PM after 1 week.    Dispense:  60 tablet    Refill:  3    Discontinue all previous orders for gabapentin and pregabalin, fill new prescription today.   famotidine (PEPCID) 20 MG tablet    Sig: Take 1 tablet (20 mg total) by mouth 2 (two) times daily.    Dispense:  180 tablet    Refill:  1    To replace rx for ranitidine   metoprolol tartrate (LOPRESSOR) 100 MG tablet    Sig: Take 1 tablet (100 mg total) by mouth 2 (two) times daily.    Dispense:  180 tablet    Refill:  1    Increased dose   nitroGLYCERIN (NITROSTAT) 0.4 MG SL tablet    Sig: Place 1 tablet (0.4 mg total) under the tongue every 5 (five) minutes x 3 doses as needed for chest pain.    Dispense:  30 tablet    Refill:  1   pantoprazole (PROTONIX) 40 MG tablet    Sig: Take 1 tablet (40 mg total) by mouth daily.    Dispense:  90 tablet    Refill:  1    Return in about 3 months (around 01/19/2022) for F/U, Recheck A1C,  PCP.   Total time spent:30 Minutes Time spent includes review of chart, medications, test results, and follow up plan with the patient.    Controlled Substance Database was reviewed by me.  This patient was seen by AJonetta Osgood FNP-C in collaboration with Dr. FClayborn Bignessas a part of collaborative care agreement.   R. AValetta Fuller MSN, FNP-C Internal medicine

## 2021-10-19 NOTE — Telephone Encounter (Signed)
Awaiting 10/19/21 office notes for orthopedic surgery referral-[facility]

## 2021-10-20 LAB — UA/M W/RFLX CULTURE, ROUTINE
Bilirubin, UA: NEGATIVE
Ketones, UA: NEGATIVE
Leukocytes,UA: NEGATIVE
Nitrite, UA: NEGATIVE
Protein,UA: NEGATIVE
RBC, UA: NEGATIVE
Specific Gravity, UA: >=1.03 — AB (ref 1.005–1.030)
Urobilinogen, Ur: 0.2 mg/dL (ref 0.2–1.0)
pH, UA: 5 (ref 5.0–7.5)

## 2021-10-20 LAB — MICROSCOPIC EXAMINATION
Bacteria, UA: NONE SEEN
Casts: NONE SEEN /lpf
Epithelial Cells (non renal): NONE SEEN /hpf (ref 0–10)
RBC, Urine: NONE SEEN /hpf (ref 0–2)
WBC, UA: NONE SEEN /hpf (ref 0–5)

## 2021-11-04 ENCOUNTER — Ambulatory Visit (INDEPENDENT_AMBULATORY_CARE_PROVIDER_SITE_OTHER): Payer: Medicare Other | Admitting: Podiatry

## 2021-11-04 ENCOUNTER — Telehealth: Payer: Self-pay

## 2021-11-04 DIAGNOSIS — E0843 Diabetes mellitus due to underlying condition with diabetic autonomic (poly)neuropathy: Secondary | ICD-10-CM | POA: Diagnosis not present

## 2021-11-04 DIAGNOSIS — M2042 Other hammer toe(s) (acquired), left foot: Secondary | ICD-10-CM

## 2021-11-04 DIAGNOSIS — M2041 Other hammer toe(s) (acquired), right foot: Secondary | ICD-10-CM

## 2021-11-04 NOTE — Progress Notes (Signed)
Patient presents to pick up Diabetic shoes and inserts.  Instructions were given as well as Medicare suppliers standards.  Patient was advised to call if he has any questions or concerns.

## 2021-11-04 NOTE — Telephone Encounter (Signed)
Orthopedic referral sent via [facility] to [facility] 

## 2021-11-08 ENCOUNTER — Telehealth: Payer: Self-pay

## 2021-11-08 NOTE — Telephone Encounter (Signed)
11/11/21 Orthopedic appointment @ [facility]

## 2021-11-11 DIAGNOSIS — I700 Atherosclerosis of aorta: Secondary | ICD-10-CM | POA: Diagnosis not present

## 2021-11-11 DIAGNOSIS — I10 Essential (primary) hypertension: Secondary | ICD-10-CM | POA: Diagnosis not present

## 2021-11-11 DIAGNOSIS — M7072 Other bursitis of hip, left hip: Secondary | ICD-10-CM | POA: Diagnosis not present

## 2021-11-11 DIAGNOSIS — E1140 Type 2 diabetes mellitus with diabetic neuropathy, unspecified: Secondary | ICD-10-CM | POA: Diagnosis not present

## 2021-11-11 DIAGNOSIS — M1712 Unilateral primary osteoarthritis, left knee: Secondary | ICD-10-CM | POA: Diagnosis not present

## 2021-11-11 DIAGNOSIS — K219 Gastro-esophageal reflux disease without esophagitis: Secondary | ICD-10-CM | POA: Diagnosis not present

## 2021-11-11 DIAGNOSIS — I209 Angina pectoris, unspecified: Secondary | ICD-10-CM | POA: Diagnosis not present

## 2021-11-11 DIAGNOSIS — E559 Vitamin D deficiency, unspecified: Secondary | ICD-10-CM | POA: Diagnosis not present

## 2021-11-12 LAB — CBC WITH DIFFERENTIAL/PLATELET
Basophils Absolute: 0.1 10*3/uL (ref 0.0–0.2)
Basos: 1 %
EOS (ABSOLUTE): 0.2 10*3/uL (ref 0.0–0.4)
Eos: 2 %
Hematocrit: 47.9 % (ref 37.5–51.0)
Hemoglobin: 16.3 g/dL (ref 13.0–17.7)
Immature Grans (Abs): 0 10*3/uL (ref 0.0–0.1)
Immature Granulocytes: 0 %
Lymphocytes Absolute: 3 10*3/uL (ref 0.7–3.1)
Lymphs: 30 %
MCH: 30.2 pg (ref 26.6–33.0)
MCHC: 34 g/dL (ref 31.5–35.7)
MCV: 89 fL (ref 79–97)
Monocytes Absolute: 0.7 10*3/uL (ref 0.1–0.9)
Monocytes: 7 %
Neutrophils Absolute: 6 10*3/uL (ref 1.4–7.0)
Neutrophils: 60 %
Platelets: 221 10*3/uL (ref 150–450)
RBC: 5.39 x10E6/uL (ref 4.14–5.80)
RDW: 13.1 % (ref 11.6–15.4)
WBC: 10.1 10*3/uL (ref 3.4–10.8)

## 2021-11-12 LAB — CMP14+EGFR
ALT: 15 IU/L (ref 0–44)
AST: 15 IU/L (ref 0–40)
Albumin/Globulin Ratio: 1.3 (ref 1.2–2.2)
Albumin: 4.2 g/dL (ref 3.8–4.9)
Alkaline Phosphatase: 157 IU/L — ABNORMAL HIGH (ref 44–121)
BUN/Creatinine Ratio: 13 (ref 9–20)
BUN: 13 mg/dL (ref 6–24)
Bilirubin Total: 0.3 mg/dL (ref 0.0–1.2)
CO2: 23 mmol/L (ref 20–29)
Calcium: 9.9 mg/dL (ref 8.7–10.2)
Chloride: 100 mmol/L (ref 96–106)
Creatinine, Ser: 1.02 mg/dL (ref 0.76–1.27)
Globulin, Total: 3.3 g/dL (ref 1.5–4.5)
Glucose: 182 mg/dL — ABNORMAL HIGH (ref 70–99)
Potassium: 4.8 mmol/L (ref 3.5–5.2)
Sodium: 136 mmol/L (ref 134–144)
Total Protein: 7.5 g/dL (ref 6.0–8.5)
eGFR: 86 mL/min/{1.73_m2} (ref >59)

## 2021-11-12 LAB — LIPID PANEL
Chol/HDL Ratio: 4.4 ratio (ref 0.0–5.0)
Cholesterol, Total: 151 mg/dL (ref 100–199)
HDL: 34 mg/dL — ABNORMAL LOW (ref >39)
LDL Chol Calc (NIH): 76 mg/dL (ref 0–99)
Triglycerides: 251 mg/dL — ABNORMAL HIGH (ref 0–149)
VLDL Cholesterol Cal: 41 mg/dL — ABNORMAL HIGH (ref 5–40)

## 2021-11-12 LAB — VITAMIN D 25 HYDROXY (VIT D DEFICIENCY, FRACTURES): Vit D, 25-Hydroxy: 24.5 ng/mL — ABNORMAL LOW (ref 30.0–100.0)

## 2021-11-19 ENCOUNTER — Emergency Department: Payer: Medicare Other

## 2021-11-19 ENCOUNTER — Other Ambulatory Visit: Payer: Self-pay

## 2021-11-19 ENCOUNTER — Emergency Department
Admission: EM | Admit: 2021-11-19 | Discharge: 2021-11-19 | Disposition: A | Discharge status: Home / Self Care | Payer: Medicare Other | Attending: Emergency Medicine | Admitting: Emergency Medicine

## 2021-11-19 DIAGNOSIS — M545 Low back pain, unspecified: Secondary | ICD-10-CM | POA: Diagnosis present

## 2021-11-19 DIAGNOSIS — R519 Headache, unspecified: Secondary | ICD-10-CM | POA: Insufficient documentation

## 2021-11-19 DIAGNOSIS — M5442 Lumbago with sciatica, left side: Secondary | ICD-10-CM | POA: Diagnosis not present

## 2021-11-19 DIAGNOSIS — R531 Weakness: Secondary | ICD-10-CM | POA: Diagnosis not present

## 2021-11-19 DIAGNOSIS — R0781 Pleurodynia: Secondary | ICD-10-CM | POA: Diagnosis not present

## 2021-11-19 DIAGNOSIS — E119 Type 2 diabetes mellitus without complications: Secondary | ICD-10-CM | POA: Diagnosis not present

## 2021-11-19 DIAGNOSIS — I10 Essential (primary) hypertension: Secondary | ICD-10-CM | POA: Insufficient documentation

## 2021-11-19 DIAGNOSIS — M25522 Pain in left elbow: Secondary | ICD-10-CM

## 2021-11-19 DIAGNOSIS — M25552 Pain in left hip: Secondary | ICD-10-CM | POA: Diagnosis not present

## 2021-11-19 DIAGNOSIS — R29898 Other symptoms and signs involving the musculoskeletal system: Secondary | ICD-10-CM | POA: Diagnosis not present

## 2021-11-19 LAB — CBC WITH DIFFERENTIAL/PLATELET
Abs Immature Granulocytes: 0.02 10*3/uL (ref 0.00–0.07)
Basophils Absolute: 0.1 10*3/uL (ref 0.0–0.1)
Basophils Relative: 1 %
Eosinophils Absolute: 0.1 10*3/uL (ref 0.0–0.5)
Eosinophils Relative: 1 %
HCT: 46.1 % (ref 39.0–52.0)
Hemoglobin: 15.4 g/dL (ref 13.0–17.0)
Immature Granulocytes: 0 %
Lymphocytes Relative: 33 %
Lymphs Abs: 2.8 10*3/uL (ref 0.7–4.0)
MCH: 29.7 pg (ref 26.0–34.0)
MCHC: 33.4 g/dL (ref 30.0–36.0)
MCV: 88.8 fL (ref 80.0–100.0)
Monocytes Absolute: 0.6 10*3/uL (ref 0.1–1.0)
Monocytes Relative: 7 %
Neutro Abs: 5.1 10*3/uL (ref 1.7–7.7)
Neutrophils Relative %: 58 %
Platelets: 213 10*3/uL (ref 150–400)
RBC: 5.19 MIL/uL (ref 4.22–5.81)
RDW: 12.8 % (ref 11.5–15.5)
WBC: 8.7 10*3/uL (ref 4.0–10.5)
nRBC: 0 % (ref 0.0–0.2)

## 2021-11-19 LAB — BASIC METABOLIC PANEL
Anion gap: 7 (ref 5–15)
BUN: 16 mg/dL (ref 6–20)
CO2: 23 mmol/L (ref 22–32)
Calcium: 9.1 mg/dL (ref 8.9–10.3)
Chloride: 106 mmol/L (ref 98–111)
Creatinine, Ser: 0.92 mg/dL (ref 0.61–1.24)
GFR, Estimated: >60 mL/min (ref >60)
Glucose, Bld: 172 mg/dL — ABNORMAL HIGH (ref 70–99)
Potassium: 4.3 mmol/L (ref 3.5–5.1)
Sodium: 136 mmol/L (ref 135–145)

## 2021-11-19 MED ORDER — LIDOCAINE 5 % EX PTCH
1.0000 | MEDICATED_PATCH | CUTANEOUS | Status: DC
  Administered 2021-11-19: 1 via TRANSDERMAL
  Filled 2021-11-19: qty 1

## 2021-11-19 MED ORDER — KETOROLAC TROMETHAMINE 15 MG/ML IJ SOLN
15.0000 mg | Freq: Once | INTRAMUSCULAR | Status: AC
  Administered 2021-11-19: 15 mg via INTRAVENOUS
  Filled 2021-11-19: qty 1

## 2021-11-19 MED ORDER — LIDOCAINE 5 % EX PTCH: 1.0000 | MEDICATED_PATCH | Freq: Two times a day (BID) | CUTANEOUS | 0 refills | Status: AC

## 2021-11-19 NOTE — ED Notes (Addendum)
Pt reports pain to left shoulder, hip, knee and back. Pt reports no injuries that he is aware of. Pt with full ROM. Pt reports when he tries to pick things up it hurts and he cant. Pt states has arthritis as well and not sure if that is what causing it.

## 2021-11-19 NOTE — ED Provider Notes (Signed)
[facility] Provider Note    Event Date/Time   First MD Initiated Contact with Patient 11/19/21 1102     (approximate)   History   Rib Injury, Back Pain, and Knee Pain   HPI  Raymond [name] is a 56 y.o. male with a past medical history of emphysema, diabetes, hypertension, hyperlipidemia, who presents today for evaluation of left-sided shoulder pain, left elbow pain, and left hip pain for the past 2 weeks.  Patient reports that he has pain in his left low back that radiates into his left hip to his knee.  He also reports that he has pain in his elbow and feels like it is popping intermittently. His elbow pain and hip pain is worse with movement, but he feels like he still has full movement.  He has not noticed any redness or joint swelling.  He has not had any fevers or chills.  He denies any recent outdoor activity or tick exposures.  He has not had any ataxia or speech changes.  He denies chest pain or shortness of breath.  He denies paresthesias.  Patient Active Problem List   Diagnosis Date Noted   Abnormal LFTs 04/22/2021   Insomnia due to medical condition 12/13/2020   At risk for prolonged QT interval syndrome 12/13/2020   Hypersomnia with sleep apnea 12/13/2020   Mixed hyperlipidemia 09/26/2020   Pulmonary emphysema ([place]) 09/26/2020   Barrett's esophagus without dysplasia    Gastric erythema    Special screening for malignant neoplasms, colon    Polyp of colon    MDD (major depressive disorder), recurrent episode, mild ([place]) 06/23/2020   Noncompliance with diabetes treatment 04/12/2020   Angina pectoris ([place]) 04/12/2020   MDD (major depressive disorder), recurrent, in full remission ([place]) 01/05/2020   MDD (major depressive disorder), recurrent, in partial remission ([place]) 10/30/2019   Tobacco use disorder 07/02/2019   Right upper quadrant pain    Encounter for other preprocedural examination 01/20/2019   PTSD (post-traumatic stress disorder)  01/20/2019   MDD (major depressive disorder), recurrent episode, moderate ([place]) 01/20/2019   Insomnia due to mental disorder 01/20/2019   Calculus of gallbladder with acute on chronic cholecystitis without obstruction 11/15/2018   Generalized abdominal pain 10/08/2018   Local superficial swelling, mass or lump 10/08/2018   Enterocolitis 08/16/2018   Left upper quadrant abdominal pain of unknown etiology 08/16/2018   Diastasis recti 06/19/2018   Generalized anxiety disorder 04/01/2018   Clotted blood in stool 12/31/2017   Diarrhea 12/31/2017   Encounter for general adult medical examination with abnormal findings 10/27/2017   Uncontrolled type 2 diabetes mellitus with hyperglycemia ([place]) 10/27/2017   Onychomycosis with ingrown toenail 10/27/2017   Dysuria 10/27/2017   Uncontrolled type 2 diabetes mellitus with hypoglycemia without coma ([place]) 07/08/2017   Shortness of breath 07/08/2017   Cigarette nicotine dependence without complication 42/59/5638   Influenza A 05/30/2017   OSA (obstructive sleep apnea) 08/12/2015   Cervical spondylosis 06/14/2015   Essential hypertension 04/08/2015   Depression 04/08/2015   Gastroesophageal reflux disease without esophagitis 01/05/2015   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 --      Excl. in [place]? --     Most recent vital signs: Vitals:   11/19/21 1032 11/19/21 1405  BP: (!) 153/89 123/79  Pulse: (!) 116 88  Resp: 18 16  Temp: 98.4 F (36.9 C)  97.8 F (36.6 C)  SpO2: 99% 96%    Physical Exam Vitals and nursing note reviewed.  Constitutional:      General: Awake and alert. No acute distress.    Appearance: Normal appearance. The patient is normal weight.  HENT:     Head: Normocephalic and atraumatic.     Mouth: Mucous membranes are moist.  Eyes:     General: PERRL. Normal EOMs        Right eye: No discharge.        Left eye: No discharge.     Conjunctiva/sclera: Conjunctivae normal.  Cardiovascular:     Rate and Rhythm: Normal rate and regular rhythm.     Pulses: Normal pulses.     Heart sounds: Normal heart sounds Pulmonary:     Effort: Pulmonary effort is normal. No respiratory distress.     Breath sounds: Normal breath sounds.  Abdominal:     Abdomen is soft. There is no abdominal tenderness. No rebound or guarding. No distention. Musculoskeletal:        General: No swelling. Normal range of motion.     Cervical back: Normal range of motion and neck supple.  No midline cervical spine tenderness.  Full range of motion of neck.  Negative Spurling test.  Negative Lhermitte sign.  Normal strength and sensation in bilateral upper extremities. Normal grip strength bilaterally.  Normal intrinsic muscle function of the hand bilaterally.  Normal radial pulses bilaterally. Back: No midline tenderness. Strength and sensation 5/5 to bilateral lower extremities. Normal great toe extension against resistance. Normal sensation throughout feet. Normal patellar reflexes. Positive SLR and opposite SLR on the left.  Tenderness to palpation to left lateral epicondyle.  Full and normal range of motion at the level of the elbow.  No overlying skin color changes.  No effusion.  Normal resisted supination and pronation. Left shoulder: Mild tenderness to palpation to the top of shoulder. No obvious deformity, swelling, ecchymosis, or erythema No clavicular or AC joint tenderness Able to actively and passively forward flex and abduct at  shoulder fully, negative drop arm test Negative Obriens, SLAP, empty can, and lift off tests though mild discomfort with movement Normal internal and external rotation against resistance Normal ROM at elbow and wrist Normal resisted pronation and supination 2+ radial pulse Normal grip strength Normal intrinsic hand muscle function Skin:    General: Skin is warm and dry.     Capillary Refill: Capillary refill takes less than 2 seconds.     Findings: No rash.  No joint erythema or warmth Neurological:     Mental Status: The patient is awake and alert.  Neurological: GCS 15 alert and oriented x3 Normal speech, no expressive or receptive aphasia or dysarthria Cranial nerves II through XII intact Normal visual fields 5 out  of 5 strength in all 4 extremities with intact sensation throughout No extremity drift Normal finger-to-nose testing, no limb or truncal ataxia     ED Results / Procedures / Treatments   Labs (all labs ordered are listed, but only abnormal results are displayed) Labs Reviewed  BASIC METABOLIC PANEL - Abnormal; Notable for the following components:      Result Value   Glucose, Bld 172 (*)    All other components within normal limits  CBC WITH DIFFERENTIAL/PLATELET     EKG     RADIOLOGY I independently reviewed and interpreted imaging and agree with radiologists findings.    PROCEDURES:  Critical Care performed:   Procedures   MEDICATIONS ORDERED IN ED: Medications  lidocaine (LIDODERM) 5 % 1 patch (1 patch Transdermal Patch Applied 11/19/21 1139)  ketorolac (TORADOL) 15 MG/ML injection 15 mg (15 mg Intravenous Given 11/19/21 1146)     IMPRESSION / MDM / ASSESSMENT AND PLAN / ED COURSE  I reviewed the triage vital signs and the nursing notes.   Differential diagnosis includes, but is not limited to, lateral epicondylitis, lumbar radiculopathy, hip bursitis, trapezius muscle strain, cervical radiculopathy, CVA, musculoskeletal injury,  osteoarthritis.  No constitutional symptoms, erythema, joint effusion, or warmth to suggest infected joint or infectious etiology.  Range of motion of affected joints is fully intact with both active and passive range of motion.  Patient is awake and alert, tachycardic on arrival though this resolved upon recheck.  Labs obtained are overall reassuring.  Patient has no neurological deficits.  He has normal strength and sensation in all 4 extremities.  He has tenderness to his lateral epicondyles of his left arm, though he has intact strength with resisted pronation and supination.  He requested an Ace bandage for this location which provided significant relief.  Negative Spurling and Lhermitte sign, do not suspect cervical radiculopathy.  Pain does not appear to be contiguous throughout arm.  It is easily reproducible with palpation.  Patient was recently seen by orthopedics for left hip bursitis and patient feels that his pain is the same today.  He reports that he is scheduled to the orthopedist again and the orthopedist had recommended a cortisone injection.  X-rays of his affected areas of pain are normal.  Patient has normal and equal strength in upper and lower extremities, however given that he feels that his symptoms are entirely left-sided, CT scan was obtained.  It would be unusual to have pain in joints with the CVA which I explained to patient and he understands and agrees.  CT head is normal.  He has mild tenderness to palpation to his left lower back with a positive straight leg rise on that side.  He has 5 out of 5 strength with intact sensation to extensor hallucis dorsiflexion and plantarflexion of bilateral lower extremities with normal patellar reflexes bilaterally. Most likely etiology at this point is muscle strain vs herniated disc. No red flags to indicate patient is at risk for more auspicious process that would require urgent/emergent spinal imaging or subspecialty evaluation at this  time. No major trauma, no midline tenderness, no history or physical exam findings to suggest cauda equina syndrome or spinal cord compression. No focal neurological deficits on exam. No constitutional symptoms or history of immunosuppression or IVDA to suggest potential for epidural abscess. Not anticoagulated, no history of bleeding diastasis to suggest risk for epidural hematoma. No chronic steroid use or advanced age or history of malignancy to suggest proclivity towards pathological fracture.  No abdominal pain or flank pain to suggest kidney stone, no history of kidney stone.  No fever or dysuria or CVAT to suggest pyelonephritis .  No chest pain, back pain, shortness of breath, neurological deficits, to suggest vascular catastrophe, and pulses are equal in all 4 extremities.  He has no abdominal pain.    Discussed care instructions and return precautions with patient. Recommended close outpatient follow-up for re-evaluation. Patient agrees with plan of care. Will treat the patient symptomatically as needed for pain control. Will discharge patient to take these medications and return for any worsening or different pain or development of any neurologic symptoms. Educated patient regarding expected time course for back pain to improve and recommended very close outpatient follow-up.  Patient understands and agrees with plan.  He was discharged in stable condition.    Patient's presentation is most consistent with acute complicated illness / injury requiring diagnostic workup.      FINAL CLINICAL IMPRESSION(S) / ED DIAGNOSES   Final diagnoses:  Acute left-sided low back pain with left-sided sciatica  Left elbow pain     Rx / DC Orders   ED Discharge Orders          Ordered    lidocaine (LIDODERM) 5 %  Every 12 hours        11/19/21 1350             Note:  This document was prepared using Dragon voice recognition software and may include unintentional dictation errors.   Emeline Gins 11/19/21 1436    Vanessa Fishhook, MD 11/19/21 1728

## 2021-11-19 NOTE — ED Notes (Signed)
Pt verbalizes understanding of d/c instructions, medications and follow up 

## 2021-11-19 NOTE — ED Triage Notes (Signed)
Ambulatory to triage with c/o pain to left rib pain and lower middle back pain. Reports pain makes it difficult to ambulate or bend knee. Denies injury to areas, states pain began several weeks ago.

## 2021-11-19 NOTE — Discharge Instructions (Signed)
Please follow-up with your orthopedist.  Please return for any new, worsening, or change in symptoms or other concerns.

## 2021-11-21 ENCOUNTER — Telehealth: Payer: Self-pay

## 2021-11-21 NOTE — Telephone Encounter (Signed)
Patient will f/u with orthopedic for ED f/u-Toni

## 2021-12-02 DIAGNOSIS — M7712 Lateral epicondylitis, left elbow: Secondary | ICD-10-CM | POA: Diagnosis not present

## 2021-12-03 ENCOUNTER — Other Ambulatory Visit: Payer: Self-pay | Admitting: Nurse Practitioner

## 2021-12-07 ENCOUNTER — Encounter: Payer: Self-pay | Admitting: Nurse Practitioner

## 2021-12-07 DIAGNOSIS — I700 Atherosclerosis of aorta: Secondary | ICD-10-CM | POA: Insufficient documentation

## 2021-12-20 ENCOUNTER — Encounter: Payer: Self-pay | Admitting: Psychiatry

## 2021-12-20 ENCOUNTER — Telehealth (INDEPENDENT_AMBULATORY_CARE_PROVIDER_SITE_OTHER): Payer: Medicare Other | Admitting: Psychiatry

## 2021-12-20 DIAGNOSIS — F3342 Major depressive disorder, recurrent, in full remission: Secondary | ICD-10-CM | POA: Diagnosis not present

## 2021-12-20 DIAGNOSIS — G4701 Insomnia due to medical condition: Secondary | ICD-10-CM

## 2021-12-20 DIAGNOSIS — F419 Anxiety disorder, unspecified: Secondary | ICD-10-CM

## 2021-12-20 DIAGNOSIS — F4310 Post-traumatic stress disorder, unspecified: Secondary | ICD-10-CM

## 2021-12-20 DIAGNOSIS — F17200 Nicotine dependence, unspecified, uncomplicated: Secondary | ICD-10-CM

## 2021-12-20 MED ORDER — TEMAZEPAM 30 MG PO CAPS: 30.0000 mg | ORAL_CAPSULE | Freq: Every evening | ORAL | 3 refills | Status: DC | PRN

## 2021-12-20 MED ORDER — DULOXETINE HCL 30 MG PO CPEP: 30.0000 mg | ORAL_CAPSULE | Freq: Every day | ORAL | 1 refills | Status: DC

## 2021-12-20 NOTE — Progress Notes (Signed)
Virtual Visit via Video Note  I connected with Raymond [name] on 12/20/21 at 11:40 AM EDT by a video enabled telemedicine application and verified that I am speaking with the correct person using two identifiers.  Location Provider Location : [facility] Patient Location : Home  Participants: Patient , Provider    I discussed the limitations of evaluation and management by telemedicine and the availability of in person appointments. The patient expressed understanding and agreed to proceed.   I discussed the assessment and treatment plan with the patient. The patient was provided an opportunity to ask questions and all were answered. The patient agreed with the plan and demonstrated an understanding of the instructions.   The patient was advised to call back or seek an in-person evaluation if the symptoms worsen or if the condition fails to improve as anticipated.   Raymond [name] OP Progress Note  12/20/2021 2:36 PM Raymond [name]  MRN:  [id number]  Chief Complaint:  Chief Complaint  Patient presents with   Follow-up: 56 year old male, has a history of depression, PTSD, insomnia, acute exacerbation of chronic pain, presented for medication management.   HPI: Raymond [name] is a 56 year old male, divorced, has a history of MDD, PTSD, tobacco use disorder, uncontrolled diabetes, hyperlipidemia, hepatic steatosis, lives in [place] was evaluated by telemedicine today.  Patient today reports he is currently struggling with left-sided pain, off his elbow, shoulder,hip, that does affect his mood.  Patient reports he received cortisone injection as well as meloxicam.  However her pain is not currently under control.  That does make him anxious also affects sleep although overall he has been sleeping okay.  Patient reports although he does not have any significant depression he is unable to do the things that he enjoys because of the pain.  That does make him sad.  Patient is currently compliant  on the Cymbalta, Wellbutrin.  He is also compliant on the temazepam for sleep.  Denies side effects.  Patient denies any suicidality, homicidality or perceptual disturbances.  Continues to smoke cigarettes, reports he was able to cut back previously however with the current exacerbation of pain his smoking has gotten worse.  Patient denies any other concerns today.  Visit Diagnosis:    ICD-10-CM   1. MDD (major depressive disorder), recurrent, in full remission ([place])  F33.42 temazepam (RESTORIL) 30 MG capsule    DULoxetine (CYMBALTA) 30 MG capsule    2. PTSD (post-traumatic stress disorder)  F43.10 temazepam (RESTORIL) 30 MG capsule    DULoxetine (CYMBALTA) 30 MG capsule    3. Insomnia due to medical condition  G47.01    OSA, pain    4. Anxiety disorder, unspecified type  F41.9    likely due to pain    5. Tobacco use disorder  F17.200       Past Psychiatric History: Reviewed past psychiatric history from progress note on 05/23/2018.  Past trials of Tegretol, doxepin, Klonopin, Seroquel, mirtazapine, Belsomra, Lunesta, Ativan, risperidone, Lexapro.  Past Medical History:  Past Medical History:  Diagnosis Date   Anxiety    Arthritis    NECK   Bronchitis    Coronary artery disease    Depression    Diabetes mellitus without complication (HCC)    Diastasis of rectus abdominis 06/19/2018   Dyspnea    DUE TO GALLBLADDER PER PT   Emphysema lung (HCC)    Family history of adverse reaction to anesthesia    N/V, TROUBLE BREATHING COMING OUT OF ANESTHESIA   Fatty  liver    GERD (gastroesophageal reflux disease)    Headache    MIGRAINES   Hyperlipidemia    Hypertension    Irregular heart beat unk   Myocardial infarction ([place]) 2010   MILD    PTSD (post-traumatic stress disorder)    Sleep apnea    USES CPAP   Tachycardia     Past Surgical History:  Procedure Laterality Date    ingrown toenail removal     CHOLECYSTECTOMY N/A 01/23/2019   Procedure: LAPAROSCOPIC  CHOLECYSTECTOMY;  Surgeon: Olean Ree, [name];  Location: [facility];  Service: General;  Laterality: N/A;   CLAVICLE SURGERY Right    COLONOSCOPY  2017   COLONOSCOPY WITH PROPOFOL N/A 08/25/2020   Procedure: COLONOSCOPY WITH PROPOFOL;  Surgeon: Virgel Manifold, [name];  Location: [facility];  Service: Endoscopy;  Laterality: N/A;   ESOPHAGOGASTRODUODENOSCOPY (EGD) WITH PROPOFOL N/A 08/25/2020   Procedure: ESOPHAGOGASTRODUODENOSCOPY (EGD) WITH PROPOFOL;  Surgeon: Virgel Manifold, [name];  Location: [facility] END[facility]COPY;  Service: Endoscopy;  Laterality: N/A;   MOUTH SURGERY     REMOVED ALL TEETH   SHOULDER SURGERY Right    UPPER GI ENDOSCOPY  2017    Family Psychiatric History:Reviewed family psychiatric history from progress note on 05/23/2018.  Family History:  Family History  Problem Relation Age of Onset   Hypertension Mother    Diabetes type II Mother    Diabetes Mother    COPD Father    Cancer Father    Heart disease Father    Diabetes Sister    Anxiety disorder Sister    Depression Sister    Diabetes Brother    Anxiety disorder Brother    Depression Brother    Diabetes Maternal Aunt    Diabetes Sister    Anxiety disorder Sister    Depression Sister    Diabetes Sister    Anxiety disorder Sister    Depression Sister    Diabetes Sister    Anxiety disorder Sister    Depression Sister    Colon cancer Maternal Uncle     Social History: Reviewed social history from progress note on 05/23/2018. Social History   Socioeconomic History   Marital status: Divorced    Spouse name: Not on file   Number of children: 4   Years of education: Not on file   Highest education level: 8th grade  Occupational History   Not on file  Tobacco Use   Smoking status: Every Day    Packs/day: 1.00    Years: 40.00    Total pack years: 40.00    Types: Cigarettes   Smokeless tobacco: Former    Types: Chew   Tobacco comments:    1 pack per day reported 01/03/2021  Vaping Use   Vaping Use:  Former  Substance and Sexual Activity   Alcohol use: No    Alcohol/week: 0.0 standard drinks of alcohol   Drug use: No   Sexual activity: Not Currently  Other Topics Concern   Not on file  Social History Narrative   Not on file   Social Determinants of Health   Financial Resource Strain: Low Risk  (08/17/2020)   Overall Financial Resource Strain (CARDIA)    Difficulty of Paying Living Expenses: Not very hard  Food Insecurity: Food Insecurity Present (05/23/2018)   Hunger Vital Sign    Worried About Running Out of Food in the Last Year: Often true    Ran Out of Food in the Last Year: Often true  Transportation Needs: No  Transportation Needs (05/23/2018)   PRAPARE - Hydrologist (Medical): No    Lack of Transportation (Non-Medical): No  Physical Activity: Inactive (05/23/2018)   Exercise Vital Sign    Days of Exercise per Week: 0 days    Minutes of Exercise per Session: 0 min  Stress: Stress Concern Present (05/23/2018)   [place]    Feeling of Stress : Very much  Social Connections: Unknown (05/23/2018)   Social Connection and Isolation Panel [NHANES]    Frequency of Communication with Friends and Family: Not on file    Frequency of Social Gatherings with Friends and Family: Not on file    Attends Religious Services: Never    Active Member of Clubs or Organizations: No    Attends Archivist Meetings: Never    Marital Status: Divorced    Allergies:  Allergies  Allergen Reactions   Onion Anaphylaxis   Hydrocodone    Nicoderm [Nicotine]    Norco [Hydrocodone-Acetaminophen] Itching   Hydrocodone-Acetaminophen Itching   Nickel Rash    Metabolic Disorder Labs: Lab Results  Component Value Date   HGBA1C 8.0 (A) 10/19/2021   MPG 294.83 05/31/2017   No results found for: "PROLACTIN" Lab Results  Component Value Date   CHOL 151 11/11/2021   TRIG 251 (H) 11/11/2021    HDL 34 (L) 11/11/2021   CHOLHDL 4.4 11/11/2021   VLDL 26 12/09/2012   LDLCALC 76 11/11/2021   LDLCALC 153 (H) 08/17/2020   Lab Results  Component Value Date   TSH 1.890 08/17/2020   TSH 4.120 06/10/2018    Therapeutic Level Labs: No results found for: "LITHIUM" No results found for: "VALPROATE" No results found for: "CBMZ"  Current Medications: Current Outpatient Medications  Medication Sig Dispense Refill   ACCU-CHEK FASTCLIX LANCETS MISC yse as directed twice a day 100 each 1   albuterol (VENTOLIN HFA) 108 (90 Base) MCG/ACT inhaler Inhale 2 puffs into the lungs every 6 (six) hours as needed.     Albuterol Sulfate (PROAIR RESPICLICK) 324 (90 Base) MCG/ACT AEPB Inhale 2 puff by po every  hrs as needed for wheezing for SOB 1 each 5   aspirin EC 81 MG tablet Take 81 mg by mouth daily at 3 pm.      atorvastatin (LIPITOR) 40 MG tablet Take 1 tablet (40 mg total) by mouth daily. 90 tablet 3   Budeson-Glycopyrrol-Formoterol (BREZTRI AEROSPHERE) 160-9-4.8 MCG/ACT AERO Inhale 2 puffs into the lungs 2 (two) times daily. 10.7 g 11   buPROPion (WELLBUTRIN XL) 150 MG 24 hr tablet Take 1 tablet (150 mg total) by mouth daily. Take along with 300 mg daily 90 tablet 1   buPROPion (WELLBUTRIN XL) 300 MG 24 hr tablet Take 1 tablet (300 mg total) by mouth daily. 90 tablet 1   Continuous Blood Gluc Sensor (FREESTYLE LIBRE 2 SENSOR) MISC USE 1 SENSOR EVERY 14 DAYS 9 each 1   dapagliflozin propanediol (FARXIGA) 10 MG TABS tablet Take 1 tablet (10 mg total) by mouth daily before breakfast. 90 tablet 2   Dulaglutide 4.5 MG/0.5ML SOPN Inject 4.5 mg into the skin once a week. 2 mL 3   DULoxetine (CYMBALTA) 30 MG capsule Take 1 capsule (30 mg total) by mouth daily. Take along with 60 mg daily, total of 90 mg daily 90 capsule 1   DULoxetine (CYMBALTA) 60 MG capsule Take 1 capsule (60 mg total) by mouth daily. 90 capsule 1  ezetimibe (ZETIA) 10 MG tablet Take 1 tablet (10 mg total) by mouth daily. 90 tablet 3    famotidine (PEPCID) 20 MG tablet Take 1 tablet (20 mg total) by mouth 2 (two) times daily. 180 tablet 1   gabapentin (NEURONTIN) 600 MG tablet Take 0.5 tablets (300 mg total) by mouth 2 (two) times daily. May increase to 1 whole tablet in PM after 1 week. 60 tablet 3   glucose blood (ACCU-CHEK AVIVA PLUS) test strip Blood sugar testing TID and as needed. E11.65 100 each 12   hydrOXYzine (VISTARIL) 25 MG capsule Take 1-2 capsules (25-50 mg total) by mouth at bedtime as needed. 60 capsule 3   insulin glargine, 2 Unit Dial, (TOUJEO MAX SOLOSTAR) 300 UNIT/ML Solostar Pen Inject 60 Units into the skin daily. May decrease to 40 units at bedtime if having episodes of hypoglycemia. 18 mL 3   Insulin Pen Needle 32G X 4 MM MISC Use with daily dose insulin and with sliding scale insulin as needed. 100 each 5   MELATONIN PO Take 10 mg by mouth daily.     meloxicam (MOBIC) 15 MG tablet Take 1 tablet by mouth daily.     metoprolol tartrate (LOPRESSOR) 100 MG tablet Take 1 tablet (100 mg total) by mouth 2 (two) times daily. 180 tablet 1   nitroGLYCERIN (NITROSTAT) 0.4 MG SL tablet Place 1 tablet (0.4 mg total) under the tongue every 5 (five) minutes x 3 doses as needed for chest pain. 30 tablet 1   pantoprazole (PROTONIX) 40 MG tablet Take 1 tablet (40 mg total) by mouth daily. 90 tablet 1   polyethylene glycol (MIRALAX) 17 g packet Take 17 g by mouth daily. 30 each 0   Metoprolol Succinate 100 MG CS24  (Patient not taking: Reported on 12/20/2021)     [START ON 01/16/2022] temazepam (RESTORIL) 30 MG capsule Take 1 capsule (30 mg total) by mouth at bedtime as needed for sleep. 30 capsule 3   No current facility-administered medications for this visit.     Musculoskeletal: Strength & Muscle Tone:  UTA Gait & Station:  Seated Patient leans: N/A  Psychiatric Specialty Exam: Review of Systems  Musculoskeletal:  Positive for arthralgias and back pain.       Elbow, shoulder pain ( left ) , left hip pain   Psychiatric/Behavioral:  Positive for sleep disturbance. The patient is nervous/anxious.   All other systems reviewed and are negative.   There were no vitals taken for this visit.There is no height or weight on file to calculate BMI.  General Appearance: Casual  Eye Contact:  Fair  Speech:  Clear and Coherent  Volume:  Normal  Mood:  Anxious  Affect:  Congruent  Thought Process:  Goal Directed and Descriptions of Associations: Intact  Orientation:  Full (Time, Place, and Person)  Thought Content: Logical   Suicidal Thoughts:  No  Homicidal Thoughts:  No  Memory:  Immediate;   Fair Recent;   Fair Remote;   Fair  Judgement:  Fair  Insight:  Fair  Psychomotor Activity:  Normal  Concentration:  Concentration: Fair and Attention Span: Fair  Recall:  [employer] of Knowledge: Fair  Language: Fair  Akathisia:  No  Handed:  Right  AIMS (if indicated): done  Assets:  Communication Skills Desire for Improvement Housing Social Support  ADL's:  Intact  Cognition: WNL  Sleep:   Restless   Screenings: GAD-7    Flowsheet Row Video Visit from 12/20/2021 in [facility] Video Visit from 06/20/2021 in [place] Visit from 11/22/2020 in [place]  Total GAD-7 Score '6 8 11      '$ [place] Office Visit from 10/19/2021 in [facility], [place] from 10/18/2020 in [facility], [place] from 10/17/2019 in [facility] [place] from 10/08/2018 in [facility] [place] from 10/05/2017 in [facility], [facility]  Total Score (max 30 points ) '30 29 30 27 30      '$ PHQ2-9    Flowsheet Row Video Visit from 12/20/2021 in [place] Office Visit from 10/19/2021 in [facility], [facility] Office Visit from 07/12/2021 in [facility] Video  Visit from 06/20/2021 in [place] Video Visit from 04/22/2021 in [place]  PHQ-2 Total Score 1 0 0 1 1  PHQ-9 Total Score -- -- -- -- 3      Flowsheet Row Video Visit from 12/20/2021 in [place] [facility] from 11/19/2021 in [place] [facility] from 04/22/2021 in [place] CATEGORY No Risk No Risk No Risk        Assessment and Plan: JAVEION CANNEDY is a 56 year old Caucasian male who lives in [place], divorced, has a history of MDD, insomnia, diabetes mellitus, history of vision loss, neuropathy was evaluated by telemedicine today.  Patient is currently struggling with pain which does have an impact on his anxiety, will benefit from the following plan.  Plan MDD in remission Cymbalta as prescribed Wellbutrin XL 450 mg p.o. daily  Insomnia-stable Temazepam 30 mg p.o. nightly Reviewed [place] PMP aware Hydroxyzine 25-50 mg p.o. nightly as needed CPAP for OSA Sleep study-01/05/2021-Periodic limb movement.  PTSD-stable Cymbalta as prescribed  Anxiety disorder unspecified-unstable Likely due to pain.  Will benefit from sufficient pain management. Increase Cymbalta to 90 mg p.o. daily   Tobacco use disorder-unstable Provided counseling for 1 minute.  Patient has information for [place] quit now program.  Follow-up in clinic in 4 weeks or sooner if needed.   Consent: Patient/Guardian gives verbal consent for treatment and assignment of benefits for services provided during this visit. Patient/Guardian expressed understanding and agreed to proceed.  This note was generated in part or whole with voice recognition software. Voice recognition is usually quite accurate but there are transcription errors that can and very often do occur. I apologize for any typographical errors that were not detected and  corrected.       Ursula Alert, [name] 12/20/2021, 2:36 PM

## 2021-12-31 ENCOUNTER — Other Ambulatory Visit: Payer: Self-pay | Admitting: Nurse Practitioner

## 2022-01-18 ENCOUNTER — Encounter: Payer: Self-pay | Admitting: Nurse Practitioner

## 2022-01-18 ENCOUNTER — Ambulatory Visit (INDEPENDENT_AMBULATORY_CARE_PROVIDER_SITE_OTHER): Payer: Medicare Other | Admitting: Nurse Practitioner

## 2022-01-18 VITALS — BP 139/82 | HR 75 | Temp 98.2°F | Resp 16 | Ht 69.0 in | Wt 228.8 lb

## 2022-01-18 DIAGNOSIS — I10 Essential (primary) hypertension: Secondary | ICD-10-CM

## 2022-01-18 DIAGNOSIS — K219 Gastro-esophageal reflux disease without esophagitis: Secondary | ICD-10-CM | POA: Diagnosis not present

## 2022-01-18 DIAGNOSIS — I700 Atherosclerosis of aorta: Secondary | ICD-10-CM | POA: Diagnosis not present

## 2022-01-18 DIAGNOSIS — E1165 Type 2 diabetes mellitus with hyperglycemia: Secondary | ICD-10-CM

## 2022-01-18 LAB — POCT GLYCOSYLATED HEMOGLOBIN (HGB A1C): Hemoglobin A1C: 8 % — AB (ref 4.0–5.6)

## 2022-01-18 MED ORDER — TOUJEO MAX SOLOSTAR 300 UNIT/ML ~~LOC~~ SOPN: 64.0000 [IU] | PEN_INJECTOR | Freq: Every day | SUBCUTANEOUS | 3 refills | Status: DC

## 2022-01-18 NOTE — Progress Notes (Signed)
[facility] [place], [place] [address]  Internal MEDICINE  Office Visit Note  Patient Name: Raymond [name]  [id number]  [id number]  Date of Service: 01/18/2022  Chief Complaint  Patient presents with   Follow-up   Diabetes   Gastroesophageal Reflux   Depression   Hyperlipidemia   Hypertension    HPI Raymond [name] presents for a follow up visit for diabetes, hypertension, GERD, and hyperlipidemia.  Diabetes -- no change, a1c still 8.0, glucose level typically >200 Hypertension -- stable on current medication GERD -- controlled with current medication Hyperlipidemia -- takes atorvastatin     Current Medication: Outpatient Encounter Medications as of 01/18/2022  Medication Sig   ACCU-CHEK FASTCLIX LANCETS MISC yse as directed twice a day   albuterol (VENTOLIN HFA) 108 (90 Base) MCG/ACT inhaler INHALE 2 PUFFS INTO THE LUNGS EVERY 6 HOURS AS MEEDED FOR WHEEZING OR SHORTNESS OF BREATH   Albuterol Sulfate (PROAIR RESPICLICK) 790 (90 Base) MCG/ACT AEPB Inhale 2 puff by po every  hrs as needed for wheezing for SOB   aspirin EC 81 MG tablet Take 81 mg by mouth daily at 3 pm.    atorvastatin (LIPITOR) 40 MG tablet Take 1 tablet (40 mg total) by mouth daily.   Budeson-Glycopyrrol-Formoterol (BREZTRI AEROSPHERE) 160-9-4.8 MCG/ACT AERO Inhale 2 puffs into the lungs 2 (two) times daily.   buPROPion (WELLBUTRIN XL) 150 MG 24 hr tablet Take 1 tablet (150 mg total) by mouth daily. Take along with 300 mg daily   buPROPion (WELLBUTRIN XL) 300 MG 24 hr tablet Take 1 tablet (300 mg total) by mouth daily.   Continuous Blood Gluc Sensor (FREESTYLE LIBRE 2 SENSOR) MISC USE 1 SENSOR EVERY 14 DAYS   dapagliflozin propanediol (FARXIGA) 10 MG TABS tablet Take 1 tablet (10 mg total) by mouth daily before breakfast.   Dulaglutide 4.5 MG/0.5ML SOPN Inject 4.5 mg into the skin once a week.   DULoxetine (CYMBALTA) 30 MG capsule Take 1 capsule (30 mg total) by mouth daily. Take along  with 60 mg daily, total of 90 mg daily   DULoxetine (CYMBALTA) 60 MG capsule Take 1 capsule (60 mg total) by mouth daily.   ezetimibe (ZETIA) 10 MG tablet Take 1 tablet (10 mg total) by mouth daily.   famotidine (PEPCID) 20 MG tablet Take 1 tablet (20 mg total) by mouth 2 (two) times daily.   gabapentin (NEURONTIN) 600 MG tablet Take 0.5 tablets (300 mg total) by mouth 2 (two) times daily. May increase to 1 whole tablet in PM after 1 week.   glucose blood (ACCU-CHEK AVIVA PLUS) test strip Blood sugar testing TID and as needed. E11.65   hydrOXYzine (VISTARIL) 25 MG capsule Take 1-2 capsules (25-50 mg total) by mouth at bedtime as needed.   insulin glargine, 2 Unit Dial, (TOUJEO MAX SOLOSTAR) 300 UNIT/ML Solostar Pen Inject 60 Units into the skin daily. May decrease to 40 units at bedtime if having episodes of hypoglycemia.   Insulin Pen Needle 32G X 4 MM MISC Use with daily dose insulin and with sliding scale insulin as needed.   MELATONIN PO Take 10 mg by mouth daily.   meloxicam (MOBIC) 15 MG tablet Take 1 tablet by mouth daily.   Metoprolol Succinate 100 MG CS24    metoprolol tartrate (LOPRESSOR) 100 MG tablet Take 1 tablet (100 mg total) by mouth 2 (two) times daily.   nitroGLYCERIN (NITROSTAT) 0.4 MG SL tablet Place 1 tablet (0.4 mg total) under the tongue every 5 (five)  minutes x 3 doses as needed for chest pain.   pantoprazole (PROTONIX) 40 MG tablet Take 1 tablet (40 mg total) by mouth daily.   polyethylene glycol (MIRALAX) 17 g packet Take 17 g by mouth daily.   temazepam (RESTORIL) 30 MG capsule Take 1 capsule (30 mg total) by mouth at bedtime as needed for sleep.   No facility-administered encounter medications on file as of 01/18/2022.    Surgical History: Past Surgical History:  Procedure Laterality Date    ingrown toenail removal     CHOLECYSTECTOMY N/A 01/23/2019   Procedure: LAPAROSCOPIC CHOLECYSTECTOMY;  Surgeon: Olean Ree, MD;  Location: [facility] ORS;  Service: General;   Laterality: N/A;   CLAVICLE SURGERY Right    COLONOSCOPY  2017   COLONOSCOPY WITH PROPOFOL N/A 08/25/2020   Procedure: COLONOSCOPY WITH PROPOFOL;  Surgeon: Virgel Manifold, MD;  Location: [facility] ENDOSCOPY;  Service: Endoscopy;  Laterality: N/A;   ESOPHAGOGASTRODUODENOSCOPY (EGD) WITH PROPOFOL N/A 08/25/2020   Procedure: ESOPHAGOGASTRODUODENOSCOPY (EGD) WITH PROPOFOL;  Surgeon: Virgel Manifold, MD;  Location: [facility] ENDOSCOPY;  Service: Endoscopy;  Laterality: N/A;   MOUTH SURGERY     REMOVED ALL TEETH   SHOULDER SURGERY Right    UPPER GI ENDOSCOPY  2017    Medical History: Past Medical History:  Diagnosis Date   Anxiety    Arthritis    NECK   Bronchitis    Coronary artery disease    Depression    Diabetes mellitus without complication ([place])    Diastasis of rectus abdominis 06/19/2018   Dyspnea    DUE TO GALLBLADDER PER PT   Emphysema lung (HCC)    Family history of adverse reaction to anesthesia    N/V, TROUBLE BREATHING COMING OUT OF ANESTHESIA   Fatty liver    GERD (gastroesophageal reflux disease)    Headache    MIGRAINES   Hyperlipidemia    Hypertension    Irregular heart beat unk   Myocardial infarction ([place]) 2010   MILD    PTSD (post-traumatic stress disorder)    Sleep apnea    USES CPAP   Tachycardia     Family History: Family History  Problem Relation Age of Onset   Hypertension Mother    Diabetes type II Mother    Diabetes Mother    COPD Father    Cancer Father    Heart disease Father    Diabetes Sister    Anxiety disorder Sister    Depression Sister    Diabetes Brother    Anxiety disorder Brother    Depression Brother    Diabetes Maternal Aunt    Diabetes Sister    Anxiety disorder Sister    Depression Sister    Diabetes Sister    Anxiety disorder Sister    Depression Sister    Diabetes Sister    Anxiety disorder Sister    Depression Sister    Colon cancer Maternal Uncle     Social History   Socioeconomic History   Marital status:  Divorced    Spouse name: Not on file   Number of children: 4   Years of education: Not on file   Highest education level: 8th grade  Occupational History   Not on file  Tobacco Use   Smoking status: Every Day    Packs/day: 1.00    Years: 40.00    Total pack years: 40.00    Types: Cigarettes   Smokeless tobacco: Former    Types: Chew   Tobacco comments:  1 pack per day reported 01/03/2021  Vaping Use   Vaping Use: Former  Substance and Sexual Activity   Alcohol use: No    Alcohol/week: 0.0 standard drinks of alcohol   Drug use: No   Sexual activity: Not Currently  Other Topics Concern   Not on file  Social History Narrative   Not on file   Social Determinants of Health   Financial Resource Strain: Low Risk  (08/17/2020)   Overall Financial Resource Strain (CARDIA)    Difficulty of Paying Living Expenses: Not very hard  Food Insecurity: Food Insecurity Present (05/23/2018)   Hunger Vital Sign    Worried About Running Out of Food in the Last Year: Often true    Ran Out of Food in the Last Year: Often true  Transportation Needs: No Transportation Needs (05/23/2018)   PRAPARE - Hydrologist (Medical): No    Lack of Transportation (Non-Medical): No  Physical Activity: Inactive (05/23/2018)   Exercise Vital Sign    Days of Exercise per Week: 0 days    Minutes of Exercise per Session: 0 min  Stress: Stress Concern Present (05/23/2018)   [place]    Feeling of Stress : Very much  Social Connections: Unknown (05/23/2018)   Social Connection and Isolation Panel [NHANES]    Frequency of Communication with Friends and Family: Not on file    Frequency of Social Gatherings with Friends and Family: Not on file    Attends Religious Services: Never    Marine scientist or Organizations: No    Attends Archivist Meetings: Never    Marital Status: Divorced  Human resources officer  Violence: Not At Risk (05/23/2018)   Humiliation, Afraid, Rape, and Kick questionnaire    Fear of Current or Ex-Partner: No    Emotionally Abused: No    Physically Abused: No    Sexually Abused: No      Review of Systems  Constitutional:  Negative for chills, fatigue and unexpected weight change.  HENT:  Negative for congestion, rhinorrhea, sneezing and sore throat.   Eyes:  Negative for redness.  Respiratory:  Negative for cough, chest tightness and shortness of breath.   Cardiovascular:  Negative for chest pain and palpitations.  Gastrointestinal:  Negative for abdominal pain, constipation, diarrhea, nausea and vomiting.  Genitourinary:  Negative for dysuria and frequency.  Musculoskeletal:  Negative for arthralgias, back pain, joint swelling and neck pain.  Skin:  Negative for rash.  Neurological: Negative.  Negative for tremors and numbness.  Hematological:  Negative for adenopathy. Does not bruise/bleed easily.  Psychiatric/Behavioral:  Negative for behavioral problems (Depression), sleep disturbance and suicidal ideas. The patient is not nervous/anxious.     Vital Signs: BP 139/82   Pulse 75   Temp 98.2 F (36.8 C)   Resp 16   Ht '5\' 9"'$  (1.753 m)   Wt 228 lb 12.8 oz (103.8 kg)   SpO2 100%   BMI 33.79 kg/m    Physical Exam Vitals reviewed.  Constitutional:      General: He is not in acute distress.    Appearance: Normal appearance. He is obese. He is not ill-appearing.  HENT:     Head: Normocephalic and atraumatic.  Eyes:     Pupils: Pupils are equal, round, and reactive to light.  Cardiovascular:     Rate and Rhythm: Normal rate and regular rhythm.  Pulmonary:     Effort: Pulmonary effort  is normal. No respiratory distress.  Neurological:     Mental Status: He is alert and oriented to person, place, and time.  Psychiatric:        Mood and Affect: Mood normal.        Behavior: Behavior normal.       Assessment/Plan: 1. Uncontrolled type 2 diabetes  mellitus with hyperglycemia (HCC) No change in A1c, still 8.0. discussed diet and lifestyle modifications, continue current medications as prescribed adhere to diabetic diet as discussed. Follow up in 3 months for repeat a1c - POCT glycosylated hemoglobin (Hb A1C) - insulin glargine, 2 Unit Dial, (TOUJEO MAX SOLOSTAR) 300 UNIT/ML Solostar Pen; Inject 64 Units into the skin daily. May decrease to 40 units at bedtime if having episodes of hypoglycemia.  Dispense: 18 mL; Refill: 3  2. Essential hypertension Stable on current medications, continue as prescribed  3. Aortic atherosclerosis (HCC) Taking atorvastatin 40 mg daily, continue as prescribed  4. Gastroesophageal reflux disease without esophagitis Stable on pantoprazole and famotidine   General Counseling: Miro verbalizes understanding of the findings of todays visit and agrees with plan of treatment. I have discussed any further diagnostic evaluation that may be needed or ordered today. We also reviewed his medications today. he has been encouraged to call the office with any questions or concerns that should arise related to todays visit.    Orders Placed This Encounter  Procedures   POCT glycosylated hemoglobin (Hb A1C)    No orders of the defined types were placed in this encounter.   Return in about 3 months (around 04/19/2022) for F/U, Recheck A1C, Damyra Luscher PCP.   Total time spent:30 Minutes Time spent includes review of chart, medications, test results, and follow up plan with the patient.    Controlled Substance Database was reviewed by me.  This patient was seen by Jonetta Osgood, FNP-C in collaboration with Dr. Clayborn Bigness as a part of collaborative care agreement.   Isaih Bulger R. Valetta Fuller, MSN, FNP-C Internal medicine

## 2022-02-02 ENCOUNTER — Emergency Department
Admission: EM | Admit: 2022-02-02 | Discharge: 2022-02-02 | Disposition: A | Discharge status: Home / Self Care | Payer: Medicare Other

## 2022-02-02 NOTE — ED Notes (Signed)
Pt called for traige x 2, pt not in lobby, BR or outside.  Pt LWOT at this time.

## 2022-02-06 ENCOUNTER — Encounter: Payer: Self-pay | Admitting: Nurse Practitioner

## 2022-02-06 ENCOUNTER — Ambulatory Visit (INDEPENDENT_AMBULATORY_CARE_PROVIDER_SITE_OTHER): Payer: Medicare Other | Admitting: Nurse Practitioner

## 2022-02-06 ENCOUNTER — Ambulatory Visit
Admission: RE | Admit: 2022-02-06 | Discharge: 2022-02-06 | Disposition: A | Discharge status: Home / Self Care | Payer: Medicare Other | Attending: Nurse Practitioner | Admitting: Nurse Practitioner

## 2022-02-06 ENCOUNTER — Ambulatory Visit
Admission: RE | Admit: 2022-02-06 | Discharge: 2022-02-06 | Disposition: A | Discharge status: Home / Self Care | Payer: Medicare Other | Source: Ambulatory Visit | Attending: Nurse Practitioner | Admitting: Nurse Practitioner

## 2022-02-06 VITALS — BP 149/75 | HR 78 | Temp 97.4°F | Resp 16 | Ht 69.0 in | Wt 231.4 lb

## 2022-02-06 DIAGNOSIS — G8929 Other chronic pain: Secondary | ICD-10-CM

## 2022-02-06 DIAGNOSIS — M25522 Pain in left elbow: Secondary | ICD-10-CM | POA: Diagnosis not present

## 2022-02-06 DIAGNOSIS — M545 Low back pain, unspecified: Secondary | ICD-10-CM

## 2022-02-06 DIAGNOSIS — M130 Polyarthritis, unspecified: Secondary | ICD-10-CM

## 2022-02-06 DIAGNOSIS — M549 Dorsalgia, unspecified: Secondary | ICD-10-CM | POA: Diagnosis not present

## 2022-02-06 DIAGNOSIS — M25562 Pain in left knee: Secondary | ICD-10-CM

## 2022-02-06 NOTE — Progress Notes (Signed)
[facility] [place], [place] [address]  Internal MEDICINE  Office Visit Note  Patient Name: Raymond [name]  [id number]  [id number]  Date of Service: 02/06/2022  Chief Complaint  Patient presents with   Acute Visit    Bacl, elbow and leg pain.      HPI Raymond [name] presents for an acute sick visit for back, elbow and leg pain Bilateral knee pain left>right Left elbow pain x6 months approx boney tenderness  lateral epicondyle Low back pain with some sciatica and also radiation up the back.     Current Medication:  Outpatient Encounter Medications as of 02/06/2022  Medication Sig   ACCU-CHEK FASTCLIX LANCETS MISC yse as directed twice a day   albuterol (VENTOLIN HFA) 108 (90 Base) MCG/ACT inhaler INHALE 2 PUFFS INTO THE LUNGS EVERY 6 HOURS AS MEEDED FOR WHEEZING OR SHORTNESS OF BREATH   Albuterol Sulfate (PROAIR RESPICLICK) 465 (90 Base) MCG/ACT AEPB Inhale 2 puff by po every  hrs as needed for wheezing for SOB   aspirin EC 81 MG tablet Take 81 mg by mouth daily at 3 pm.    atorvastatin (LIPITOR) 40 MG tablet Take 1 tablet (40 mg total) by mouth daily.   Budeson-Glycopyrrol-Formoterol (BREZTRI AEROSPHERE) 160-9-4.8 MCG/ACT AERO Inhale 2 puffs into the lungs 2 (two) times daily.   buPROPion (WELLBUTRIN XL) 150 MG 24 hr tablet Take 1 tablet (150 mg total) by mouth daily. Take along with 300 mg daily   buPROPion (WELLBUTRIN XL) 300 MG 24 hr tablet Take 1 tablet (300 mg total) by mouth daily.   Continuous Blood Gluc Sensor (FREESTYLE LIBRE 2 SENSOR) MISC USE 1 SENSOR EVERY 14 DAYS   dapagliflozin propanediol (FARXIGA) 10 MG TABS tablet Take 1 tablet (10 mg total) by mouth daily before breakfast.   Dulaglutide 4.5 MG/0.5ML SOPN Inject 4.5 mg into the skin once a week.   DULoxetine (CYMBALTA) 30 MG capsule Take 1 capsule (30 mg total) by mouth daily. Take along with 60 mg daily, total of 90 mg daily   DULoxetine (CYMBALTA) 60 MG capsule Take 1 capsule (60 mg  total) by mouth daily.   ezetimibe (ZETIA) 10 MG tablet Take 1 tablet (10 mg total) by mouth daily.   famotidine (PEPCID) 20 MG tablet Take 1 tablet (20 mg total) by mouth 2 (two) times daily.   gabapentin (NEURONTIN) 600 MG tablet Take 0.5 tablets (300 mg total) by mouth 2 (two) times daily. May increase to 1 whole tablet in PM after 1 week.   glucose blood (ACCU-CHEK AVIVA PLUS) test strip Blood sugar testing TID and as needed. E11.65   hydrOXYzine (VISTARIL) 25 MG capsule Take 1-2 capsules (25-50 mg total) by mouth at bedtime as needed.   insulin glargine, 2 Unit Dial, (TOUJEO MAX SOLOSTAR) 300 UNIT/ML Solostar Pen Inject 64 Units into the skin daily. May decrease to 40 units at bedtime if having episodes of hypoglycemia.   Insulin Pen Needle 32G X 4 MM MISC Use with daily dose insulin and with sliding scale insulin as needed.   MELATONIN PO Take 10 mg by mouth daily.   meloxicam (MOBIC) 15 MG tablet Take 1 tablet by mouth daily.   Metoprolol Succinate 100 MG CS24    metoprolol tartrate (LOPRESSOR) 100 MG tablet Take 1 tablet (100 mg total) by mouth 2 (two) times daily.   nitroGLYCERIN (NITROSTAT) 0.4 MG SL tablet Place 1 tablet (0.4 mg total) under the tongue every 5 (five) minutes x 3 doses as  needed for chest pain.   pantoprazole (PROTONIX) 40 MG tablet Take 1 tablet (40 mg total) by mouth daily.   polyethylene glycol (MIRALAX) 17 g packet Take 17 g by mouth daily.   temazepam (RESTORIL) 30 MG capsule Take 1 capsule (30 mg total) by mouth at bedtime as needed for sleep.   No facility-administered encounter medications on file as of 02/06/2022.      Medical History: Past Medical History:  Diagnosis Date   Anxiety    Arthritis    NECK   Bronchitis    Coronary artery disease    Depression    Diabetes mellitus without complication (HCC)    Diastasis of rectus abdominis 06/19/2018   Dyspnea    DUE TO GALLBLADDER PER PT   Emphysema lung (HCC)    Family history of adverse reaction  to anesthesia    N/V, TROUBLE BREATHING COMING OUT OF ANESTHESIA   Fatty liver    GERD (gastroesophageal reflux disease)    Headache    MIGRAINES   Hyperlipidemia    Hypertension    Irregular heart beat unk   Myocardial infarction ([place]) 2010   MILD    PTSD (post-traumatic stress disorder)    Sleep apnea    USES CPAP   Tachycardia      Vital Signs: BP (!) 149/75   Pulse 78   Temp (!) 97.4 F (36.3 C)   Resp 16   Ht 5' 9"  (1.753 m)   Wt 231 lb 6.4 oz (105 kg)   SpO2 96%   BMI 34.17 kg/m    Review of Systems  Constitutional:  Negative for chills, fatigue and unexpected weight change.  HENT:  Negative for congestion, rhinorrhea, sneezing and sore throat.   Eyes:  Negative for redness.  Respiratory:  Negative for cough, chest tightness and shortness of breath.   Cardiovascular:  Negative for chest pain and palpitations.  Gastrointestinal:  Negative for abdominal pain, constipation, diarrhea, nausea and vomiting.  Genitourinary:  Negative for dysuria and frequency.  Musculoskeletal:  Positive for arthralgias, back pain and joint swelling. Negative for neck pain.  Skin:  Negative for rash.  Neurological: Negative.  Negative for tremors and numbness.  Hematological:  Negative for adenopathy. Does not bruise/bleed easily.  Psychiatric/Behavioral:  Negative for behavioral problems (Depression), sleep disturbance and suicidal ideas. The patient is not nervous/anxious.     Physical Exam Vitals reviewed.  Constitutional:      General: He is not in acute distress.    Appearance: Normal appearance. He is obese. He is not ill-appearing.  HENT:     Head: Normocephalic and atraumatic.  Cardiovascular:     Rate and Rhythm: Normal rate and regular rhythm.  Pulmonary:     Effort: Pulmonary effort is normal. No respiratory distress.  Musculoskeletal:     Left elbow: Swelling present. Decreased range of motion. Tenderness present.     Lumbar back: Spasms present. Decreased range  of motion.     Right knee: Decreased range of motion.     Left knee: Swelling present. Decreased range of motion. Tenderness present.  Neurological:     Mental Status: He is alert and oriented to person, place, and time.  Psychiatric:        Mood and Affect: Mood normal.        Behavior: Behavior normal.       Assessment/Plan: 1. Chronic pain of left knee Referred to ortho - Ambulatory referral to Orthopedic Surgery  2. Left elbow pain Referred  to ortho - DG Elbow Complete Left; Future - Ambulatory referral to Orthopedic Surgery  3. Low back pain at multiple sites Xrays ordered, referred to ortho - DG Lumbar Spine Complete; Future - DG Thoracic Spine W/Swimmers; Future - Ambulatory referral to Orthopedic Surgery  4. Polyarthropathy - Ambulatory referral to Orthopedic Surgery - Sed Rate (ESR) - C-reactive protein - Rheumatoid Factor - ANA Direct w/Reflex if Positive   General Counseling: Raymond [name] verbalizes understanding of the findings of todays visit and agrees with plan of treatment. I have discussed any further diagnostic evaluation that may be needed or ordered today. We also reviewed his medications today. he has been encouraged to call the office with any questions or concerns that should arise related to todays visit.    Counseling:    Orders Placed This Encounter  Procedures   DG Lumbar Spine Complete   DG Elbow Complete Left   DG Thoracic Spine W/Swimmers   Sed Rate (ESR)   C-reactive protein   Rheumatoid Factor   ANA Direct w/Reflex if Positive   Ambulatory referral to Orthopedic Surgery    No orders of the defined types were placed in this encounter.   Return if symptoms worsen or fail to improve, for and reg follow up.  [place] Controlled Substance Database was reviewed by me for overdose risk score (ORS)  Time spent:30 Minutes Time spent with patient included reviewing progress notes, labs, imaging studies, and discussing plan for follow up.    This patient was seen by Jonetta Osgood, FNP-C in collaboration with Dr. Clayborn Bigness as a part of collaborative care agreement.  Vaudine Dutan R. Valetta Fuller, MSN, FNP-C Internal Medicine

## 2022-02-07 LAB — C-REACTIVE PROTEIN: CRP: 7 mg/L (ref 0–10)

## 2022-02-07 LAB — SEDIMENTATION RATE: Sed Rate: 22 mm/hr (ref 0–30)

## 2022-02-07 LAB — ANA W/REFLEX IF POSITIVE: Anti Nuclear Antibody (ANA): NEGATIVE

## 2022-02-07 LAB — RHEUMATOID FACTOR: Rheumatoid fact SerPl-aCnc: <10 IU/mL (ref <14.0)

## 2022-02-09 ENCOUNTER — Telehealth: Payer: Self-pay | Admitting: Nurse Practitioner

## 2022-02-09 ENCOUNTER — Encounter: Payer: Self-pay | Admitting: Psychiatry

## 2022-02-09 ENCOUNTER — Ambulatory Visit (INDEPENDENT_AMBULATORY_CARE_PROVIDER_SITE_OTHER): Payer: Medicare Other | Admitting: Psychiatry

## 2022-02-09 VITALS — BP 156/83 | HR 102 | Temp 98.1°F | Ht 69.0 in | Wt 228.0 lb

## 2022-02-09 DIAGNOSIS — F3342 Major depressive disorder, recurrent, in full remission: Secondary | ICD-10-CM | POA: Diagnosis not present

## 2022-02-09 DIAGNOSIS — F17200 Nicotine dependence, unspecified, uncomplicated: Secondary | ICD-10-CM

## 2022-02-09 DIAGNOSIS — F418 Other specified anxiety disorders: Secondary | ICD-10-CM

## 2022-02-09 DIAGNOSIS — F4310 Post-traumatic stress disorder, unspecified: Secondary | ICD-10-CM

## 2022-02-09 DIAGNOSIS — G4701 Insomnia due to medical condition: Secondary | ICD-10-CM | POA: Diagnosis not present

## 2022-02-09 MED ORDER — BUPROPION HCL ER (XL) 150 MG PO TB24: 150.0000 mg | ORAL_TABLET | Freq: Every day | ORAL | 1 refills | Status: DC

## 2022-02-09 MED ORDER — BUPROPION HCL ER (XL) 300 MG PO TB24: 300.0000 mg | ORAL_TABLET | Freq: Every day | ORAL | 1 refills | Status: DC

## 2022-02-09 MED ORDER — DULOXETINE HCL 60 MG PO CPEP: 60.0000 mg | ORAL_CAPSULE | Freq: Every day | ORAL | 1 refills | Status: DC

## 2022-02-09 NOTE — Progress Notes (Signed)
[place] MD OP Progress Note  02/09/2022 12:51 PM [name]  MRN:  [id number]  Chief Complaint:  Chief Complaint  Patient presents with   Follow-up   Depression   Anxiety   HPI: Raymond [name] is a 56 year old male, divorced, has a history of MDD, PTSD, tobacco use disorder, diabetes, hyperlipidemia, hepatic steatosis, lives in [place] was evaluated in office today.  Patient today appeared to be in excruciating pain of his back, reports it started a couple of weeks ago, currently rates it at a 10 out of 10, low back pain.  Patient reports he has been struggling with chronic back pain however currently is going through a flareup.  He does have meloxicam available however that does not seem to help.  He does have upcoming appointment with the orthopedic surgeon.  Patient reports the pain does have an impact on his mood.  He feels depressed, is unable to do the things that he enjoyed, has no energy, sleep problems concentration problem due to the pain.  Patient also reports the pain does make him anxious since he does not know what is going to happen and how to make himself feel better.  Patient however reports he is doing okay on the current medication regimen, is not interested in making any changes but would rather like to have better pain control.  Patient compliant on Wellbutrin, duloxetine, denies side effects.  Patient denies any suicidality, homicidality or perceptual disturbances.  Continues to smoke cigarettes.  Reports due to his excessive pain he has been smoking excessively otherwise he was trying to cut back.  Interested in cutting back in the future.  Patient denies any other concerns today.  Visit Diagnosis:    ICD-10-CM   1. MDD (major depressive disorder), recurrent, in full remission ([place])  F33.42 buPROPion (WELLBUTRIN XL) 150 MG 24 hr tablet    2. PTSD (post-traumatic stress disorder)  F43.10 DULoxetine (CYMBALTA) 60 MG capsule    3. Insomnia due to medical  condition  G47.01    Pain    4. Other specified anxiety disorders  F41.8    generalized anxiety not occurring more days than not    5. Tobacco use disorder  F17.200 buPROPion (WELLBUTRIN XL) 300 MG 24 hr tablet      Past Psychiatric History: Reviewed past psychiatric history from progress note on 05/23/2018.  Past trials of Tegretol, doxepin, Seroquel, mirtazapine, Belsomra, Lunesta, Ativan, risperidone, Lexapro.  Past Medical History:  Past Medical History:  Diagnosis Date   Anxiety    Arthritis    NECK   Bronchitis    Coronary artery disease    Depression    Diabetes mellitus without complication (HCC)    Diastasis of rectus abdominis 06/19/2018   Dyspnea    DUE TO GALLBLADDER PER PT   Emphysema lung ([place])    Family history of adverse reaction to anesthesia    N/V, TROUBLE BREATHING COMING OUT OF ANESTHESIA   Fatty liver    GERD (gastroesophageal reflux disease)    Headache    MIGRAINES   Hyperlipidemia    Hypertension    Irregular heart beat unk   Myocardial infarction ([place]) 2010   MILD    PTSD (post-traumatic stress disorder)    Sleep apnea    USES CPAP   Tachycardia     Past Surgical History:  Procedure Laterality Date    ingrown toenail removal     CHOLECYSTECTOMY N/A 01/23/2019   Procedure: LAPAROSCOPIC CHOLECYSTECTOMY;  Surgeon: Olean Ree, MD;  Location: [facility] ORS;  Service: General;  Laterality: N/A;   CLAVICLE SURGERY Right    COLONOSCOPY  2017   COLONOSCOPY WITH PROPOFOL N/A 08/25/2020   Procedure: COLONOSCOPY WITH PROPOFOL;  Surgeon: Virgel Manifold, MD;  Location: [facility] ENDOSCOPY;  Service: Endoscopy;  Laterality: N/A;   ESOPHAGOGASTRODUODENOSCOPY (EGD) WITH PROPOFOL N/A 08/25/2020   Procedure: ESOPHAGOGASTRODUODENOSCOPY (EGD) WITH PROPOFOL;  Surgeon: Virgel Manifold, MD;  Location: [facility] ENDOSCOPY;  Service: Endoscopy;  Laterality: N/A;   MOUTH SURGERY     REMOVED ALL TEETH   SHOULDER SURGERY Right    UPPER GI ENDOSCOPY  2017    Family  Psychiatric History: Reviewed family psychiatric history from progress note on 05/23/2018.  Family History:  Family History  Problem Relation Age of Onset   Hypertension Mother    Diabetes type II Mother    Diabetes Mother    COPD Father    Cancer Father    Heart disease Father    Diabetes Sister    Anxiety disorder Sister    Depression Sister    Diabetes Brother    Anxiety disorder Brother    Depression Brother    Diabetes Maternal Aunt    Diabetes Sister    Anxiety disorder Sister    Depression Sister    Diabetes Sister    Anxiety disorder Sister    Depression Sister    Diabetes Sister    Anxiety disorder Sister    Depression Sister    Colon cancer Maternal Uncle     Social History: Reviewed social history from progress note on 05/23/2018. Social History   Socioeconomic History   Marital status: Divorced    Spouse name: Not on file   Number of children: 4   Years of education: Not on file   Highest education level: 8th grade  Occupational History   Not on file  Tobacco Use   Smoking status: Every Day    Packs/day: 1.00    Years: 40.00    Total pack years: 40.00    Types: Cigarettes   Smokeless tobacco: Former    Types: Chew   Tobacco comments:    1 pack per day reported 01/03/2021  Vaping Use   Vaping Use: Former  Substance and Sexual Activity   Alcohol use: No    Alcohol/week: 0.0 standard drinks of alcohol   Drug use: No   Sexual activity: Not Currently  Other Topics Concern   Not on file  Social History Narrative   Not on file   Social Determinants of Health   Financial Resource Strain: Low Risk  (08/17/2020)   Overall Financial Resource Strain (CARDIA)    Difficulty of Paying Living Expenses: Not very hard  Food Insecurity: Food Insecurity Present (05/23/2018)   Hunger Vital Sign    Worried About [place] in the Last Year: Often true    Ran Out of Food in the Last Year: Often true  Transportation Needs: No Transportation Needs  (05/23/2018)   PRAPARE - Hydrologist (Medical): No    Lack of Transportation (Non-Medical): No  Physical Activity: Inactive (05/23/2018)   Exercise Vital Sign    Days of Exercise per Week: 0 days    Minutes of Exercise per Session: 0 min  Stress: Stress Concern Present (05/23/2018)   [place]    Feeling of Stress : Very much  Social Connections: Unknown (05/23/2018)   Social Connection and Isolation Panel [NHANES]  Frequency of Communication with Friends and Family: Not on file    Frequency of Social Gatherings with Friends and Family: Not on file    Attends Religious Services: Never    Active Member of Clubs or Organizations: No    Attends Archivist Meetings: Never    Marital Status: Divorced    Allergies:  Allergies  Allergen Reactions   Onion Anaphylaxis   Hydrocodone    Nicoderm [Nicotine]    Norco [Hydrocodone-Acetaminophen] Itching   Hydrocodone-Acetaminophen Itching   Nickel Rash    Metabolic Disorder Labs: Lab Results  Component Value Date   HGBA1C 8.0 (A) 01/18/2022   MPG 294.83 05/31/2017   No results found for: "PROLACTIN" Lab Results  Component Value Date   CHOL 151 11/11/2021   TRIG 251 (H) 11/11/2021   HDL 34 (L) 11/11/2021   CHOLHDL 4.4 11/11/2021   VLDL 26 12/09/2012   LDLCALC 76 11/11/2021   LDLCALC 153 (H) 08/17/2020   Lab Results  Component Value Date   TSH 1.890 08/17/2020   TSH 4.120 06/10/2018    Therapeutic Level Labs: No results found for: "LITHIUM" No results found for: "VALPROATE" No results found for: "CBMZ"  Current Medications: Current Outpatient Medications  Medication Sig Dispense Refill   ACCU-CHEK FASTCLIX LANCETS MISC yse as directed twice a day 100 each 1   albuterol (VENTOLIN HFA) 108 (90 Base) MCG/ACT inhaler INHALE 2 PUFFS INTO THE LUNGS EVERY 6 HOURS AS MEEDED FOR WHEEZING OR SHORTNESS OF BREATH 18 g 1    aspirin EC 81 MG tablet Take 81 mg by mouth daily at 3 pm.      atorvastatin (LIPITOR) 40 MG tablet Take 1 tablet (40 mg total) by mouth daily. 90 tablet 3   Budeson-Glycopyrrol-Formoterol (BREZTRI AEROSPHERE) 160-9-4.8 MCG/ACT AERO Inhale 2 puffs into the lungs 2 (two) times daily. 10.7 g 11   Continuous Blood Gluc Sensor (FREESTYLE LIBRE 2 SENSOR) MISC USE 1 SENSOR EVERY 14 DAYS 9 each 1   dapagliflozin propanediol (FARXIGA) 10 MG TABS tablet Take 1 tablet (10 mg total) by mouth daily before breakfast. 90 tablet 2   Dulaglutide 4.5 MG/0.5ML SOPN Inject 4.5 mg into the skin once a week. 2 mL 3   DULoxetine (CYMBALTA) 30 MG capsule Take 1 capsule (30 mg total) by mouth daily. Take along with 60 mg daily, total of 90 mg daily 90 capsule 1   ezetimibe (ZETIA) 10 MG tablet Take 1 tablet (10 mg total) by mouth daily. 90 tablet 3   famotidine (PEPCID) 20 MG tablet Take 1 tablet (20 mg total) by mouth 2 (two) times daily. 180 tablet 1   gabapentin (NEURONTIN) 600 MG tablet Take 0.5 tablets (300 mg total) by mouth 2 (two) times daily. May increase to 1 whole tablet in PM after 1 week. 60 tablet 3   glucose blood (ACCU-CHEK AVIVA PLUS) test strip Blood sugar testing TID and as needed. E11.65 100 each 12   hydrOXYzine (VISTARIL) 25 MG capsule Take 1-2 capsules (25-50 mg total) by mouth at bedtime as needed. 60 capsule 3   insulin glargine, 2 Unit Dial, (TOUJEO MAX SOLOSTAR) 300 UNIT/ML Solostar Pen Inject 64 Units into the skin daily. May decrease to 40 units at bedtime if having episodes of hypoglycemia. 18 mL 3   Insulin Pen Needle 32G X 4 MM MISC Use with daily dose insulin and with sliding scale insulin as needed. 100 each 5   MELATONIN PO Take 10 mg by mouth daily.  meloxicam (MOBIC) 15 MG tablet Take 1 tablet by mouth daily.     metoprolol tartrate (LOPRESSOR) 100 MG tablet Take 1 tablet (100 mg total) by mouth 2 (two) times daily. 180 tablet 1   nitroGLYCERIN (NITROSTAT) 0.4 MG SL tablet Place 1  tablet (0.4 mg total) under the tongue every 5 (five) minutes x 3 doses as needed for chest pain. 30 tablet 1   pantoprazole (PROTONIX) 40 MG tablet Take 1 tablet (40 mg total) by mouth daily. 90 tablet 1   temazepam (RESTORIL) 30 MG capsule Take 1 capsule (30 mg total) by mouth at bedtime as needed for sleep. 30 capsule 3   Albuterol Sulfate (PROAIR RESPICLICK) 932 (90 Base) MCG/ACT AEPB Inhale 2 puff by po every  hrs as needed for wheezing for SOB (Patient not taking: Reported on 02/09/2022) 1 each 5   buPROPion (WELLBUTRIN XL) 150 MG 24 hr tablet Take 1 tablet (150 mg total) by mouth daily. Take along with 300 mg daily 90 tablet 1   buPROPion (WELLBUTRIN XL) 300 MG 24 hr tablet Take 1 tablet (300 mg total) by mouth daily. Take along with 150 mg daily 90 tablet 1   DULoxetine (CYMBALTA) 60 MG capsule Take 1 capsule (60 mg total) by mouth daily. Take along with 30 mg daily 90 capsule 1   polyethylene glycol (MIRALAX) 17 g packet Take 17 g by mouth daily. (Patient not taking: Reported on 02/09/2022) 30 each 0   No current facility-administered medications for this visit.     Musculoskeletal: Strength & Muscle Tone: within normal limits Gait & Station:  slow due to pain Patient leans: N/A  Psychiatric Specialty Exam: Review of Systems  Constitutional:  Positive for fatigue.  Musculoskeletal:  Positive for back pain.  Psychiatric/Behavioral:  Positive for decreased concentration, dysphoric mood and sleep disturbance. The patient is nervous/anxious.        Mood symptoms and sleep problems due to back pain-current 10/10  All other systems reviewed and are negative.   Blood pressure (!) 156/83, pulse (!) 102, temperature 98.1 F (36.7 C), temperature source Oral, height '5\' 9"'$  (1.753 m), weight 228 lb (103.4 kg).Body mass index is 33.67 kg/m.  General Appearance: Casual  Eye Contact:  Fair  Speech:  Clear and Coherent  Volume:  Normal  Mood:  Anxious and Depressed  Affect:  Congruent   Thought Process:  Goal Directed and Descriptions of Associations: Intact  Orientation:  Full (Time, Place, and Person)  Thought Content: Logical   Suicidal Thoughts:  No  Homicidal Thoughts:  No  Memory:  Immediate;   Fair Recent;   Fair Remote;   Fair  Judgement:  Fair  Insight:  Fair  Psychomotor Activity:  Normal  Concentration:  Concentration: Fair and Attention Span: Fair  Recall:  [employer] of Knowledge: Fair  Language: Fair  Akathisia:  No  Handed:  Right  AIMS (if indicated): not done  Assets:  Communication Skills Desire for Improvement Housing Social Support Transportation  ADL's:  Intact  Cognition: WNL  Sleep:  Poor due to pain   Screenings: GAD-7    Flowsheet Row Office Visit from 02/09/2022 in [place] Video Visit from 12/20/2021 in [place] Video Visit from 06/20/2021 in [place] Office Visit from 11/22/2020 in [place]  Total GAD-7 Score '7 6 8 11      '$ [place] Visit from 10/19/2021 in [facility],  [place] Clinical Support from 10/18/2020 in [facility], [place] from 10/17/2019 in [facility], [place] from 10/08/2018 in [facility], [place] from 10/05/2017 [facility]  Total Score (max 30 points ) '30 29 30 27 30      '$ PHQ2-9    Start Visit from 02/09/2022 in [place] Video Visit from 12/20/2021 in [place] Visit from 10/19/2021 in [facility] Office Visit from 07/12/2021 in [facility] Video Visit from 06/20/2021 in [place]  PHQ-2 Total Score 2 1 0 0 1  PHQ-9 Total Score 8 -- -- -- --      [place] Visit from 02/09/2022 in [place] Video Visit from 12/20/2021 in [place] ED from 11/19/2021 in [place] No Risk No Risk No Risk        Assessment and Plan: Raymond [name] is a 56 year old Caucasian male, lives in [place], divorced, has a history of MDD, insomnia, diabetes mellitus, history of vision loss, neuropathy was evaluated in office today.  Patient is currently struggling with significant back pain since the past 2 weeks, currently going through a flareup of chronic back pain, does have mood symptoms, sleep problems due to the same.  Benefit from sufficient pain management.  Plan as noted below.  Plan MDD in remission Cymbalta as prescribed Wellbutrin XL 450 mg p.o. daily   Other specified anxiety disorder-generalized anxiety not occurring more days than not-unstable Due to pain. Continue Cymbalta 90 mg p.o. daily-recently adjusted.  Insomnia-unstable due to pain Patient will need sufficient pain management Temazepam 30 mg p.o. nightly Hydroxyzine 25-50 mg p.o. nightly as needed Continue CPAP for OSA Sleep study-01/05/2021-Periodic limb movement. Reviewed [place] PMP AWARxE  PTSD-stable Cymbalta 90 mg p.o. daily  Tobacco use disorder-unstable Provided counseling for 1 minute.  Patient has upcoming appointment with orthopedic surgeon for pain management.  Patient with elevated blood pressure reading likely due to pain, blood pressure was repeated, patient to follow up with primary care provider.  Follow-up in clinic in 3 months or sooner if needed.  This note was generated in part or whole with voice recognition software. Voice recognition is usually quite accurate but there are transcription errors that can and very often do occur. I apologize for any typographical errors that were not detected and corrected.      Ursula Alert, MD 02/09/2022, 12:51 PM

## 2022-02-09 NOTE — Telephone Encounter (Signed)
Awaiting 02/06/22 office notes for Orthopedic referral-Toni

## 2022-03-04 ENCOUNTER — Encounter: Payer: Self-pay | Admitting: Nurse Practitioner

## 2022-03-23 ENCOUNTER — Encounter: Payer: Self-pay | Admitting: Nurse Practitioner

## 2022-03-23 ENCOUNTER — Telehealth: Payer: Self-pay | Admitting: Nurse Practitioner

## 2022-03-23 NOTE — Telephone Encounter (Signed)
Ortho Surgery referral sent via [facility] to [place]

## 2022-03-28 ENCOUNTER — Telehealth: Payer: Self-pay | Admitting: Nurse Practitioner

## 2022-03-28 NOTE — Telephone Encounter (Signed)
Orthopedic appointment 04/06/22 with [facility]-Toni

## 2022-04-06 DIAGNOSIS — M65332 Trigger finger, left middle finger: Secondary | ICD-10-CM | POA: Diagnosis not present

## 2022-04-06 DIAGNOSIS — M1710 Unilateral primary osteoarthritis, unspecified knee: Secondary | ICD-10-CM | POA: Diagnosis not present

## 2022-04-06 DIAGNOSIS — M67912 Unspecified disorder of synovium and tendon, left shoulder: Secondary | ICD-10-CM | POA: Diagnosis not present

## 2022-04-06 DIAGNOSIS — M25562 Pain in left knee: Secondary | ICD-10-CM | POA: Diagnosis not present

## 2022-04-06 DIAGNOSIS — M7712 Lateral epicondylitis, left elbow: Secondary | ICD-10-CM | POA: Diagnosis not present

## 2022-04-10 NOTE — Progress Notes (Signed)
[facility] Quality Team Note  Name: Raymond [name] Date of Birth: 19-Oct-1965 MRN: [id number] Date: 04/10/2022  [facility] Quality Team has reviewed this patient's chart, please see recommendations below:  [facility] Quality Other; (KED GAP- KIDNEY HEALTH EVALUATION- PATIENT NEEDS URINE MICROALBUMIN/CREATININE RATIO TEST COMPLETED BEFORE END OF YEAR FOR GAP CLOSURE. PATIENT HAS UPCOMING APPT 04/19/2022)

## 2022-04-12 DIAGNOSIS — M1712 Unilateral primary osteoarthritis, left knee: Secondary | ICD-10-CM | POA: Diagnosis not present

## 2022-04-13 ENCOUNTER — Encounter: Payer: Self-pay | Admitting: Emergency Medicine

## 2022-04-13 ENCOUNTER — Other Ambulatory Visit: Payer: Self-pay

## 2022-04-13 ENCOUNTER — Emergency Department
Admission: EM | Admit: 2022-04-13 | Discharge: 2022-04-13 | Disposition: A | Discharge status: Home / Self Care | Payer: Medicare Other | Attending: Emergency Medicine | Admitting: Emergency Medicine

## 2022-04-13 ENCOUNTER — Emergency Department: Payer: Medicare Other

## 2022-04-13 DIAGNOSIS — I10 Essential (primary) hypertension: Secondary | ICD-10-CM | POA: Insufficient documentation

## 2022-04-13 DIAGNOSIS — M25562 Pain in left knee: Secondary | ICD-10-CM | POA: Diagnosis not present

## 2022-04-13 DIAGNOSIS — M1712 Unilateral primary osteoarthritis, left knee: Secondary | ICD-10-CM | POA: Diagnosis not present

## 2022-04-13 DIAGNOSIS — M79662 Pain in left lower leg: Secondary | ICD-10-CM | POA: Diagnosis not present

## 2022-04-13 DIAGNOSIS — I2510 Atherosclerotic heart disease of native coronary artery without angina pectoris: Secondary | ICD-10-CM | POA: Diagnosis not present

## 2022-04-13 DIAGNOSIS — E119 Type 2 diabetes mellitus without complications: Secondary | ICD-10-CM | POA: Insufficient documentation

## 2022-04-13 MED ORDER — KETOROLAC TROMETHAMINE 15 MG/ML IJ SOLN
15.0000 mg | Freq: Once | INTRAMUSCULAR | Status: AC
  Administered 2022-04-13: 15 mg via INTRAMUSCULAR
  Filled 2022-04-13: qty 1

## 2022-04-13 MED ORDER — LIDOCAINE HCL (PF) 1 % IJ SOLN: 10.0000 mL | Freq: Once | INTRAMUSCULAR | Status: DC

## 2022-04-13 MED ORDER — PREDNISONE 10 MG PO TABS: ORAL_TABLET | ORAL | 0 refills | Status: AC

## 2022-04-13 MED ORDER — PREDNISONE 20 MG PO TABS
60.0000 mg | ORAL_TABLET | Freq: Once | ORAL | Status: AC
Start: 2022-04-13 — End: 2022-04-13
  Administered 2022-04-13: 60 mg via ORAL
  Filled 2022-04-13: qty 3

## 2022-04-13 NOTE — ED Triage Notes (Signed)
Patient arrives to triage in wheelchair by POV c/o left leg pain onset of this morning. Patient states yesterday morning he went to [facility] and had injection into left knee. Reports feeling fine yesterday with no issues ambulating. Today pain with any movement.

## 2022-04-13 NOTE — ED Provider Notes (Signed)
[facility] Provider Note    Event Date/Time   First MD Initiated Contact with Patient 04/13/22 [phone]     (approximate)   History   Leg Pain   HPI  Raymond [name] is a 56 y.o. male   Past medical history of CAD, diabetes, emphysema, hypertension hyperlipidemia who presents with left popliteal pain.  He had a injection of the left knee for arthritis yesterday at [facility] clinic orthopedics.  Had a noncomplicated postprocedural day and went to sleep without symptoms and awoke this morning with pain behind left knee that radiates both upwards and downwards.  He has no systemic symptoms like fevers or chills.  He has no other acute medical complaints.  Denies trauma.    History was obtained via the patient.   I reviewed external medical notes including orthopedics visit dated 56/43/3295 for uncomplicated joint injection yesterday.      Physical Exam   Triage Vital Signs: ED Triage Vitals  Enc Vitals Group     BP 04/13/22 0806 124/75     Pulse Rate 04/13/22 0806 99     Resp 04/13/22 0806 18     Temp 04/13/22 0806 98.2 F (36.8 C)     Temp Source 04/13/22 0806 Oral     SpO2 04/13/22 0806 96 %     Weight 04/13/22 0806 225 lb (102.1 kg)     Height 04/13/22 0806 '5\' 9"'$  (1.753 m)     Head Circumference --      Peak Flow --      Pain Score 04/13/22 0808 10     Pain Loc --      Pain Edu? --      Excl. in [place]? --     Most recent vital signs: Vitals:   04/13/22 0806  BP: 124/75  Pulse: 99  Resp: 18  Temp: 98.2 F (36.8 C)  SpO2: 96%    General: Awake, no distress.  CV:  Good peripheral perfusion.  Resp:  Normal effort.  Abd:  No distention.  Other:  Injection site appears noninfected and the overlying skin left knee, he has some tenderness to the popliteal area and fullness to the joint, he has having difficulty extending the knee due to pain.  Neurovascular intact below.  Muscle compartments above and below the knee are soft.   ED Results /  Procedures / Treatments   Labs (all labs ordered are listed, but only abnormal results are displayed) Labs Reviewed - No data to display     RADIOLOGY I independently reviewed and interpreted x-ray of the left knee and see no obvious fractures or dislocation   PROCEDURES:  Critical Care performed: No  Procedures   MEDICATIONS ORDERED IN ED: Medications  lidocaine (PF) (XYLOCAINE) 1 % injection 10 mL (has no administration in time range)  predniSONE (DELTASONE) tablet 60 mg (has no administration in time range)  ketorolac (TORADOL) 15 MG/ML injection 15 mg (15 mg Intramuscular Given 04/13/22 0828)    Consultants:  I spoke with Dr. Karel Jarvis of orthopedics regarding care plan for this patient.   IMPRESSION / MDM / ASSESSMENT AND PLAN / ED COURSE  I reviewed the triage vital signs and the nursing notes.                              Differential diagnosis includes, but is not limited to, osteoarthritis, allergic reaction, septic joint, bursitis, Baker's cyst, DVT, considered but  less likely fracture or dislocation   MDM: Sitting knee pain after injection yesterday in an otherwise well-appearing patient with exam as above.  Considered septic joint, DVT, fractures dislocation, bursitis or side effect of medication.  Obtain x-rays of DVTs, consult with orthopedics.    Imaging negative for acute pathologies.  Patient is stable.  I spoke with Dr. Caryn Bee clinic orthopedics who spoke with Dr. Rudene Christians who performed the procedure yesterday and given the hyaluronic acid injection yesterday which is known to have inflammatory reactions, plan is for steroid taper and follow-up in clinic.  They asked that I do not aspirate the knee today.  Patient's presentation is most consistent with acute presentation with potential threat to life or bodily function.       FINAL CLINICAL IMPRESSION(S) / ED DIAGNOSES   Final diagnoses:  Acute pain of left knee     Rx / DC Orders    ED Discharge Orders          Ordered    predniSONE (DELTASONE) 10 MG tablet  Daily        04/13/22 1017             Note:  This document was prepared using Dragon voice recognition software and may include unintentional dictation errors.    Lucillie Garfinkel, MD 04/13/22 1019

## 2022-04-13 NOTE — Discharge Instructions (Signed)
Call Dr. Rudene Christians at [facility] clinic orthopedics for follow-up appointment this week.  Take your steroid taper as prescribed.  Thank you for choosing [place] for your health care today!  Please see your primary doctor this week for a follow up appointment.   Sometimes, in the early stages of certain disease courses it is difficult to detect in the emergency department evaluation -- so, it is important that you continue to monitor your symptoms and call your doctor right away or return to the emergency department if you develop any new or worsening symptoms.  Please go to the following website to schedule new (and existing) patient appointments:   [email or web address]  If you do not have a primary doctor try calling the following clinics to establish care:  If you have insurance:  [facility] [phone] [place] [address]   [facility]  [phone] [address] [place] [address]   If you do not have insurance:  [facility]  [phone] [address], [place] [address]   The following is another list of primary care offices in the area who are accepting new patients at this time.  Please reach out to one of them directly and let them know you would like to schedule an appointment to follow up on an Emergency Department visit, and/or to establish a new primary care provider (PCP).  There are likely other primary care clinics in the are who are accepting new patients, but this is an excellent place to start:  [place] physician: Dr Lavon Paganini [address] [place], [place] [address] [phone]  [facility] Lead Physician: Dr Steele Sizer [address], [place], [place] [address] [phone]  [place] Physician: Dr Park Liter [address] [place], [place] [address] [phone]  [facility] Lead Physician: Dr Dewaine Oats [place], [place], [place] [address] [phone]  [facility] Physician: Dr Halina Maidens [address] [place], [place] [address] [phone]   It was my pleasure to care for you today.   [place] Brunette Jacelyn Grip, MD

## 2022-04-13 NOTE — ED Notes (Signed)
Pt states left knee pain for several months, but "nothing like this." Pt states he has arthritis

## 2022-04-19 ENCOUNTER — Ambulatory Visit (INDEPENDENT_AMBULATORY_CARE_PROVIDER_SITE_OTHER): Payer: Medicare Other | Admitting: Nurse Practitioner

## 2022-04-19 ENCOUNTER — Encounter: Payer: Self-pay | Admitting: Nurse Practitioner

## 2022-04-19 VITALS — BP 136/82 | HR 99 | Temp 98.7°F | Resp 16 | Ht 69.0 in | Wt 229.6 lb

## 2022-04-19 DIAGNOSIS — I700 Atherosclerosis of aorta: Secondary | ICD-10-CM | POA: Diagnosis not present

## 2022-04-19 DIAGNOSIS — I10 Essential (primary) hypertension: Secondary | ICD-10-CM | POA: Diagnosis not present

## 2022-04-19 DIAGNOSIS — E1165 Type 2 diabetes mellitus with hyperglycemia: Secondary | ICD-10-CM | POA: Diagnosis not present

## 2022-04-19 LAB — POCT GLYCOSYLATED HEMOGLOBIN (HGB A1C): Hemoglobin A1C: 8.9 % — AB (ref 4.0–5.6)

## 2022-04-19 MED ORDER — TIRZEPATIDE 5 MG/0.5ML ~~LOC~~ SOAJ: 5.0000 mg | SUBCUTANEOUS | 0 refills | Status: DC

## 2022-04-19 MED ORDER — TOUJEO MAX SOLOSTAR 300 UNIT/ML ~~LOC~~ SOPN
70.0000 [IU] | PEN_INJECTOR | Freq: Every day | SUBCUTANEOUS | 3 refills | Status: DC
Start: 2022-04-19 — End: 2022-10-16

## 2022-04-19 NOTE — Progress Notes (Cosign Needed)
[facility] [place], [place] [address]  Internal MEDICINE  Office Visit Note  Patient Name: Raymond [name]  [id number]  [id number]  Date of Service: 04/19/2022  Chief Complaint  Patient presents with   Follow-up   Depression   Diabetes   Gastroesophageal Reflux   Hypertension   Hyperlipidemia    HPI Bluford presents for a follow up visit for diabetes and hypertension.  Hypertension -- controlled with current medications  Diabetes -- has had some emotional and financial hardships, limited kinds of food to eat right now. A1c 8.9. may need to change medication Aortic atherosclerosis -- takes ezetimibe and atorvastatin     Current Medication: Outpatient Encounter Medications as of 04/19/2022  Medication Sig   ACCU-CHEK FASTCLIX LANCETS MISC yse as directed twice a day   albuterol (VENTOLIN HFA) 108 (90 Base) MCG/ACT inhaler INHALE 2 PUFFS INTO THE LUNGS EVERY 6 HOURS AS MEEDED FOR WHEEZING OR SHORTNESS OF BREATH   Albuterol Sulfate (PROAIR RESPICLICK) 481 (90 Base) MCG/ACT AEPB Inhale 2 puff by po every  hrs as needed for wheezing for SOB   aspirin EC 81 MG tablet Take 81 mg by mouth daily at 3 pm.    atorvastatin (LIPITOR) 40 MG tablet Take 1 tablet (40 mg total) by mouth daily.   Budeson-Glycopyrrol-Formoterol (BREZTRI AEROSPHERE) 160-9-4.8 MCG/ACT AERO Inhale 2 puffs into the lungs 2 (two) times daily.   buPROPion (WELLBUTRIN XL) 150 MG 24 hr tablet Take 1 tablet (150 mg total) by mouth daily. Take along with 300 mg daily   buPROPion (WELLBUTRIN XL) 300 MG 24 hr tablet Take 1 tablet (300 mg total) by mouth daily. Take along with 150 mg daily   Continuous Blood Gluc Sensor (FREESTYLE LIBRE 2 SENSOR) MISC USE 1 SENSOR EVERY 14 DAYS   dapagliflozin propanediol (FARXIGA) 10 MG TABS tablet Take 1 tablet (10 mg total) by mouth daily before breakfast.   Dulaglutide 4.5 MG/0.5ML SOPN Inject 4.5 mg into the skin once a week.   DULoxetine (CYMBALTA) 30 MG  capsule Take 1 capsule (30 mg total) by mouth daily. Take along with 60 mg daily, total of 90 mg daily   DULoxetine (CYMBALTA) 60 MG capsule Take 1 capsule (60 mg total) by mouth daily. Take along with 30 mg daily   ezetimibe (ZETIA) 10 MG tablet Take 1 tablet (10 mg total) by mouth daily.   famotidine (PEPCID) 20 MG tablet Take 1 tablet (20 mg total) by mouth 2 (two) times daily.   gabapentin (NEURONTIN) 600 MG tablet Take 0.5 tablets (300 mg total) by mouth 2 (two) times daily. May increase to 1 whole tablet in PM after 1 week.   glucose blood (ACCU-CHEK AVIVA PLUS) test strip Blood sugar testing TID and as needed. E11.65   hydrOXYzine (VISTARIL) 25 MG capsule Take 1-2 capsules (25-50 mg total) by mouth at bedtime as needed.   Insulin Pen Needle 32G X 4 MM MISC Use with daily dose insulin and with sliding scale insulin as needed.   MELATONIN PO Take 10 mg by mouth daily.   meloxicam (MOBIC) 15 MG tablet Take 1 tablet by mouth daily.   metoprolol tartrate (LOPRESSOR) 100 MG tablet Take 1 tablet (100 mg total) by mouth 2 (two) times daily.   nitroGLYCERIN (NITROSTAT) 0.4 MG SL tablet Place 1 tablet (0.4 mg total) under the tongue every 5 (five) minutes x 3 doses as needed for chest pain.   pantoprazole (PROTONIX) 40 MG tablet Take 1 tablet (40  mg total) by mouth daily.   polyethylene glycol (MIRALAX) 17 g packet Take 17 g by mouth daily.   predniSONE (DELTASONE) 10 MG tablet Take 4 tablets (40 mg total) by mouth daily for 2 days, THEN 3 tablets (30 mg total) daily for 2 days, THEN 2 tablets (20 mg total) daily for 2 days, THEN 1 tablet (10 mg total) daily for 2 days.   temazepam (RESTORIL) 30 MG capsule Take 1 capsule (30 mg total) by mouth at bedtime as needed for sleep.   tirzepatide (MOUNJARO) 5 MG/0.5ML Pen Inject 5 mg into the skin once a week.   [DISCONTINUED] insulin glargine, 2 Unit Dial, (TOUJEO MAX SOLOSTAR) 300 UNIT/ML Solostar Pen Inject 64 Units into the skin daily. May decrease to 40  units at bedtime if having episodes of hypoglycemia.   insulin glargine, 2 Unit Dial, (TOUJEO MAX SOLOSTAR) 300 UNIT/ML Solostar Pen Inject 70 Units into the skin daily. May decrease to 40 units at bedtime if having episodes of hypoglycemia.   No facility-administered encounter medications on file as of 04/19/2022.    Surgical History: Past Surgical History:  Procedure Laterality Date    ingrown toenail removal     CHOLECYSTECTOMY N/A 01/23/2019   Procedure: LAPAROSCOPIC CHOLECYSTECTOMY;  Surgeon: Olean Ree, MD;  Location: [facility] ORS;  Service: General;  Laterality: N/A;   CLAVICLE SURGERY Right    COLONOSCOPY  2017   COLONOSCOPY WITH PROPOFOL N/A 08/25/2020   Procedure: COLONOSCOPY WITH PROPOFOL;  Surgeon: Virgel Manifold, MD;  Location: [facility] ENDOSCOPY;  Service: Endoscopy;  Laterality: N/A;   ESOPHAGOGASTRODUODENOSCOPY (EGD) WITH PROPOFOL N/A 08/25/2020   Procedure: ESOPHAGOGASTRODUODENOSCOPY (EGD) WITH PROPOFOL;  Surgeon: Virgel Manifold, MD;  Location: [facility] ENDOSCOPY;  Service: Endoscopy;  Laterality: N/A;   MOUTH SURGERY     REMOVED ALL TEETH   SHOULDER SURGERY Right    UPPER GI ENDOSCOPY  2017    Medical History: Past Medical History:  Diagnosis Date   Anxiety    Arthritis    NECK   Bronchitis    Coronary artery disease    Depression    Diabetes mellitus without complication ([place])    Diastasis of rectus abdominis 06/19/2018   Dyspnea    DUE TO GALLBLADDER PER PT   Emphysema lung ([place])    Family history of adverse reaction to anesthesia    N/V, TROUBLE BREATHING COMING OUT OF ANESTHESIA   Fatty liver    GERD (gastroesophageal reflux disease)    Headache    MIGRAINES   Hyperlipidemia    Hypertension    Irregular heart beat unk   Myocardial infarction ([place]) 2010   MILD    PTSD (post-traumatic stress disorder)    Sleep apnea    USES CPAP   Tachycardia     Family History: Family History  Problem Relation Age of Onset   Hypertension Mother    Diabetes  type II Mother    Diabetes Mother    COPD Father    Cancer Father    Heart disease Father    Diabetes Sister    Anxiety disorder Sister    Depression Sister    Diabetes Brother    Anxiety disorder Brother    Depression Brother    Diabetes Maternal Aunt    Diabetes Sister    Anxiety disorder Sister    Depression Sister    Diabetes Sister    Anxiety disorder Sister    Depression Sister    Diabetes Sister    Anxiety disorder  Sister    Depression Sister    Colon cancer Maternal Uncle     Social History   Socioeconomic History   Marital status: Divorced    Spouse name: Not on file   Number of children: 4   Years of education: Not on file   Highest education level: 8th grade  Occupational History   Not on file  Tobacco Use   Smoking status: Every Day    Packs/day: 1.00    Years: 40.00    Total pack years: 40.00    Types: Cigarettes   Smokeless tobacco: Former    Types: Chew   Tobacco comments:    1 pack per day reported 01/03/2021  Vaping Use   Vaping Use: Former  Substance and Sexual Activity   Alcohol use: No    Alcohol/week: 0.0 standard drinks of alcohol   Drug use: No   Sexual activity: Not Currently  Other Topics Concern   Not on file  Social History Narrative   Not on file   Social Determinants of Health   Financial Resource Strain: Low Risk  (08/17/2020)   Overall Financial Resource Strain (CARDIA)    Difficulty of Paying Living Expenses: Not very hard  Food Insecurity: Food Insecurity Present (05/23/2018)   Hunger Vital Sign    Worried About Running Out of Food in the Last Year: Often true    Ran Out of Food in the Last Year: Often true  Transportation Needs: No Transportation Needs (05/23/2018)   PRAPARE - Hydrologist (Medical): No    Lack of Transportation (Non-Medical): No  Physical Activity: Inactive (05/23/2018)   Exercise Vital Sign    Days of Exercise per Week: 0 days    Minutes of Exercise per Session: 0 min   Stress: Stress Concern Present (05/23/2018)   [place]    Feeling of Stress : Very much  Social Connections: Unknown (05/23/2018)   Social Connection and Isolation Panel [NHANES]    Frequency of Communication with Friends and Family: Not on file    Frequency of Social Gatherings with Friends and Family: Not on file    Attends Religious Services: Never    Marine scientist or Organizations: No    Attends Archivist Meetings: Never    Marital Status: Divorced  Human resources officer Violence: Not At Risk (05/23/2018)   Humiliation, Afraid, Rape, and Kick questionnaire    Fear of Current or Ex-Partner: No    Emotionally Abused: No    Physically Abused: No    Sexually Abused: No      Review of Systems  Constitutional:  Negative for chills, fatigue and unexpected weight change.  HENT:  Negative for congestion, rhinorrhea, sneezing and sore throat.   Eyes:  Negative for redness.  Respiratory:  Negative for cough, chest tightness and shortness of breath.   Cardiovascular:  Negative for chest pain and palpitations.  Gastrointestinal:  Negative for abdominal pain, constipation, diarrhea, nausea and vomiting.  Genitourinary:  Negative for dysuria and frequency.  Musculoskeletal:  Negative for arthralgias, back pain, joint swelling and neck pain.  Skin:  Negative for rash.  Neurological: Negative.  Negative for tremors and numbness.  Hematological:  Negative for adenopathy. Does not bruise/bleed easily.  Psychiatric/Behavioral:  Negative for behavioral problems (Depression), sleep disturbance and suicidal ideas. The patient is not nervous/anxious.     Vital Signs: BP 136/82   Pulse 99   Temp 98.7 F (  37.1 C)   Resp 16   Ht '5\' 9"'$  (1.753 m)   Wt 229 lb 9.6 oz (104.1 kg)   SpO2 97%   BMI 33.91 kg/m    Physical Exam Vitals reviewed.  Constitutional:      General: He is not in acute distress.     Appearance: Normal appearance. He is obese. He is not ill-appearing.  HENT:     Head: Normocephalic and atraumatic.  Eyes:     Pupils: Pupils are equal, round, and reactive to light.  Cardiovascular:     Rate and Rhythm: Normal rate and regular rhythm.  Pulmonary:     Effort: Pulmonary effort is normal. No respiratory distress.  Neurological:     Mental Status: He is alert and oriented to person, place, and time.  Psychiatric:        Mood and Affect: Mood normal.        Behavior: Behavior normal.        Assessment/Plan: 1. Uncontrolled type 2 diabetes mellitus with hyperglycemia (HCC) V2Y elevated, trulicity discontinued, start mounjaro. Follow up in 3 months to repeat A1c.  - POCT glycosylated hemoglobin (Hb A1C) - Urine Microalbumin w/creat. ratio - insulin glargine, 2 Unit Dial, (TOUJEO MAX SOLOSTAR) 300 UNIT/ML Solostar Pen; Inject 70 Units into the skin daily. May decrease to 40 units at bedtime if having episodes of hypoglycemia.  Dispense: 18 mL; Refill: 3 - tirzepatide (MOUNJARO) 5 MG/0.5ML Pen; Inject 5 mg into the skin once a week.  Dispense: 2 mL; Refill: 0  2. Essential hypertension Stable, continue medications as prescribed.   3. Aortic atherosclerosis (HCC) Continue atorvastatin and ezetimibe as prescribed.    General Counseling: Eunice verbalizes understanding of the findings of todays visit and agrees with plan of treatment. I have discussed any further diagnostic evaluation that may be needed or ordered today. We also reviewed his medications today. he has been encouraged to call the office with any questions or concerns that should arise related to todays visit.    Orders Placed This Encounter  Procedures   Urine Microalbumin w/creat. ratio   POCT glycosylated hemoglobin (Hb A1C)    Meds ordered this encounter  Medications   insulin glargine, 2 Unit Dial, (TOUJEO MAX SOLOSTAR) 300 UNIT/ML Solostar Pen    Sig: Inject 70 Units into the skin daily. May  decrease to 40 units at bedtime if having episodes of hypoglycemia.    Dispense:  18 mL    Refill:  3   tirzepatide (MOUNJARO) 5 MG/0.5ML Pen    Sig: Inject 5 mg into the skin once a week.    Dispense:  2 mL    Refill:  0    Return in about 14 weeks (around 07/26/2022) for F/U, Recheck A1C, Detta Mellin PCP.   Total time spent:30 Minutes Time spent includes review of chart, medications, test results, and follow up plan with the patient.   [place] Controlled Substance Database was reviewed by me.  This patient was seen by Jonetta Osgood, FNP-C in collaboration with Dr. Clayborn Bigness as a part of collaborative care agreement.   Sarvesh Meddaugh R. Valetta Fuller, MSN, FNP-C Internal medicine

## 2022-04-20 LAB — MICROALBUMIN / CREATININE URINE RATIO
Creatinine, Urine: 31.6 mg/dL
Microalb/Creat Ratio: 111 mg/g creat — ABNORMAL HIGH (ref 0–29)
Microalbumin, Urine: 35 ug/mL

## 2022-04-21 ENCOUNTER — Encounter: Payer: Self-pay | Admitting: Nurse Practitioner

## 2022-04-24 HISTORY — PX: LUNG LOBECTOMY: SHX167

## 2022-05-04 DIAGNOSIS — M25412 Effusion, left shoulder: Secondary | ICD-10-CM | POA: Diagnosis not present

## 2022-05-04 DIAGNOSIS — M25512 Pain in left shoulder: Secondary | ICD-10-CM | POA: Diagnosis not present

## 2022-05-13 ENCOUNTER — Other Ambulatory Visit: Payer: Self-pay | Admitting: Nurse Practitioner

## 2022-05-13 DIAGNOSIS — E1165 Type 2 diabetes mellitus with hyperglycemia: Secondary | ICD-10-CM

## 2022-05-18 ENCOUNTER — Other Ambulatory Visit: Payer: Self-pay | Admitting: Orthopedic Surgery

## 2022-05-18 DIAGNOSIS — E1165 Type 2 diabetes mellitus with hyperglycemia: Secondary | ICD-10-CM | POA: Diagnosis not present

## 2022-05-18 DIAGNOSIS — M2392 Unspecified internal derangement of left knee: Secondary | ICD-10-CM | POA: Diagnosis not present

## 2022-05-18 DIAGNOSIS — I700 Atherosclerosis of aorta: Secondary | ICD-10-CM | POA: Diagnosis not present

## 2022-05-18 DIAGNOSIS — M1710 Unilateral primary osteoarthritis, unspecified knee: Secondary | ICD-10-CM

## 2022-05-19 ENCOUNTER — Other Ambulatory Visit: Payer: Self-pay | Admitting: Psychiatry

## 2022-05-19 DIAGNOSIS — F4310 Post-traumatic stress disorder, unspecified: Secondary | ICD-10-CM

## 2022-05-19 DIAGNOSIS — F3342 Major depressive disorder, recurrent, in full remission: Secondary | ICD-10-CM

## 2022-05-30 ENCOUNTER — Ambulatory Visit
Admission: RE | Admit: 2022-05-30 | Discharge: 2022-05-30 | Disposition: A | Discharge status: Home / Self Care | Payer: 59 | Source: Ambulatory Visit | Attending: Orthopedic Surgery | Admitting: Orthopedic Surgery

## 2022-05-30 DIAGNOSIS — M2392 Unspecified internal derangement of left knee: Secondary | ICD-10-CM | POA: Insufficient documentation

## 2022-05-30 DIAGNOSIS — M1710 Unilateral primary osteoarthritis, unspecified knee: Secondary | ICD-10-CM | POA: Diagnosis not present

## 2022-05-30 DIAGNOSIS — M25562 Pain in left knee: Secondary | ICD-10-CM | POA: Diagnosis not present

## 2022-06-05 DIAGNOSIS — M1712 Unilateral primary osteoarthritis, left knee: Secondary | ICD-10-CM | POA: Diagnosis not present

## 2022-06-09 ENCOUNTER — Other Ambulatory Visit: Payer: Self-pay | Admitting: Nurse Practitioner

## 2022-06-09 DIAGNOSIS — E1165 Type 2 diabetes mellitus with hyperglycemia: Secondary | ICD-10-CM

## 2022-06-17 ENCOUNTER — Other Ambulatory Visit: Payer: Self-pay | Admitting: Psychiatry

## 2022-06-17 DIAGNOSIS — F3342 Major depressive disorder, recurrent, in full remission: Secondary | ICD-10-CM

## 2022-06-17 DIAGNOSIS — F4310 Post-traumatic stress disorder, unspecified: Secondary | ICD-10-CM

## 2022-06-18 ENCOUNTER — Other Ambulatory Visit: Payer: Self-pay | Admitting: Psychiatry

## 2022-06-18 DIAGNOSIS — F3342 Major depressive disorder, recurrent, in full remission: Secondary | ICD-10-CM

## 2022-06-18 DIAGNOSIS — F4310 Post-traumatic stress disorder, unspecified: Secondary | ICD-10-CM

## 2022-06-18 NOTE — Telephone Encounter (Signed)
Ordered a refill of temazepam. Please schedule a follow-up with Dr. Shea Evans. Dr. Shea Evans will determine if another refill is appropriate.

## 2022-06-19 NOTE — Telephone Encounter (Signed)
Patient scheduled for virtual appt 07-19-22

## 2022-06-28 ENCOUNTER — Other Ambulatory Visit: Payer: Self-pay | Admitting: Nurse Practitioner

## 2022-06-28 DIAGNOSIS — J439 Emphysema, unspecified: Secondary | ICD-10-CM

## 2022-07-01 ENCOUNTER — Other Ambulatory Visit: Payer: Self-pay | Admitting: Psychiatry

## 2022-07-01 DIAGNOSIS — F4310 Post-traumatic stress disorder, unspecified: Secondary | ICD-10-CM

## 2022-07-01 DIAGNOSIS — F3342 Major depressive disorder, recurrent, in full remission: Secondary | ICD-10-CM

## 2022-07-04 ENCOUNTER — Other Ambulatory Visit: Payer: Self-pay | Admitting: Nurse Practitioner

## 2022-07-04 DIAGNOSIS — E1165 Type 2 diabetes mellitus with hyperglycemia: Secondary | ICD-10-CM

## 2022-07-19 ENCOUNTER — Telehealth (INDEPENDENT_AMBULATORY_CARE_PROVIDER_SITE_OTHER): Payer: 59 | Admitting: Psychiatry

## 2022-07-19 ENCOUNTER — Encounter: Payer: Self-pay | Admitting: Psychiatry

## 2022-07-19 DIAGNOSIS — G4701 Insomnia due to medical condition: Secondary | ICD-10-CM

## 2022-07-19 DIAGNOSIS — F17200 Nicotine dependence, unspecified, uncomplicated: Secondary | ICD-10-CM

## 2022-07-19 DIAGNOSIS — F4310 Post-traumatic stress disorder, unspecified: Secondary | ICD-10-CM

## 2022-07-19 DIAGNOSIS — F3342 Major depressive disorder, recurrent, in full remission: Secondary | ICD-10-CM

## 2022-07-19 DIAGNOSIS — F17210 Nicotine dependence, cigarettes, uncomplicated: Secondary | ICD-10-CM

## 2022-07-19 DIAGNOSIS — F418 Other specified anxiety disorders: Secondary | ICD-10-CM

## 2022-07-19 MED ORDER — DULOXETINE HCL 30 MG PO CPEP: 30.0000 mg | ORAL_CAPSULE | Freq: Every day | ORAL | 1 refills | Status: DC

## 2022-07-19 NOTE — Progress Notes (Signed)
Virtual Visit via Video Note  I connected with Raymond [name] on 07/19/22 at  9:00 AM EDT by a video enabled telemedicine application and verified that I am speaking with the correct person using two identifiers.  Location Provider Location : [facility] Patient Location : Home  Participants: Patient , Provider   I discussed the limitations of evaluation and management by telemedicine and the availability of in person appointments. The patient expressed understanding and agreed to proceed.   I discussed the assessment and treatment plan with the patient. The patient was provided an opportunity to ask questions and all were answered. The patient agreed with the plan and demonstrated an understanding of the instructions.   The patient was advised to call back or seek an in-person evaluation if the symptoms worsen or if the condition fails to improve as anticipated.    [facility] MD OP Progress Note  07/19/2022 9:26 AM Raymond [name]  MRN:  [id number]  Chief Complaint:  Chief Complaint  Patient presents with   Follow-up   Anxiety   Depression   Insomnia   Medication Refill   HPI: Raymond [name] is a 57 year old divorced Caucasian male, has a history of MDD, PTSD, tobacco use disorder, diabetes, hyperlipidemia, hepatic steatosis, lives in [place] was evaluated by telemedicine today.  Patient today reports overall he has been doing fairly well with regards to his mood.  Denies any significant mood swings, anxiety or depression symptoms.  Reports his pain is currently under control.  His rheumatologist started him on diclofenac and he has been taking that regularly.  That does seem to help.  Patient reports he continues to smoke cigarettes.  Patient has not been able to cut back.  Patient is not sure if he is compliant on the Wellbutrin.  Patient was provided prescription to last for 6 months in October 2023.  Patient however reports he has not been able to find his pill bottle however will  look into it and let writer know.  Patient does not know if the Wellbutrin is beneficial for smoking cessation.  Patient denies any suicidality, homicidality or perceptual disturbances.  Currently compliant on the Cymbalta, temazepam as needed, hydroxyzine.  Denies side effects.  Patient denies any other concerns today.  Visit Diagnosis:    ICD-10-CM   1. MDD (major depressive disorder), recurrent, in full remission ([place])  F33.42 DULoxetine (CYMBALTA) 30 MG capsule    2. PTSD (post-traumatic stress disorder)  F43.10 DULoxetine (CYMBALTA) 30 MG capsule    3. Insomnia due to medical condition  G47.01    Pain    4. Other specified anxiety disorders  F41.8    Generalized anxiety not occurring more days than not    5. Tobacco use disorder  F17.200       Past Psychiatric History: Reviewed past psychiatric history from progress note on 05/23/2018.  Past trials of Tegretol, doxepin, Seroquel, mirtazapine, Belsomra, Lunesta, Ativan, risperidone, Lexapro.  Past Medical History:  Past Medical History:  Diagnosis Date   Anxiety    Arthritis    NECK   Bronchitis    Coronary artery disease    Depression    Diabetes mellitus without complication (HCC)    Diastasis of rectus abdominis 06/19/2018   Dyspnea    DUE TO GALLBLADDER PER PT   Emphysema lung (HCC)    Family history of adverse reaction to anesthesia    N/V, TROUBLE BREATHING COMING OUT OF ANESTHESIA   Fatty liver    GERD (gastroesophageal reflux  disease)    Headache    MIGRAINES   Hyperlipidemia    Hypertension    Irregular heart beat unk   Myocardial infarction ([place]) 2010   MILD    PTSD (post-traumatic stress disorder)    Sleep apnea    USES CPAP   Tachycardia     Past Surgical History:  Procedure Laterality Date    ingrown toenail removal     CHOLECYSTECTOMY N/A 01/23/2019   Procedure: LAPAROSCOPIC CHOLECYSTECTOMY;  Surgeon: Olean Ree, MD;  Location: [facility] ORS;  Service: General;  Laterality: N/A;   CLAVICLE  SURGERY Right    COLONOSCOPY  2017   COLONOSCOPY WITH PROPOFOL N/A 08/25/2020   Procedure: COLONOSCOPY WITH PROPOFOL;  Surgeon: Virgel Manifold, MD;  Location: [facility] ENDOSCOPY;  Service: Endoscopy;  Laterality: N/A;   ESOPHAGOGASTRODUODENOSCOPY (EGD) WITH PROPOFOL N/A 08/25/2020   Procedure: ESOPHAGOGASTRODUODENOSCOPY (EGD) WITH PROPOFOL;  Surgeon: Virgel Manifold, MD;  Location: [facility] ENDOSCOPY;  Service: Endoscopy;  Laterality: N/A;   MOUTH SURGERY     REMOVED ALL TEETH   SHOULDER SURGERY Right    UPPER GI ENDOSCOPY  2017    Family Psychiatric History: Reviewed family psychiatric history from progress note on 05/23/2018.  Family History:  Family History  Problem Relation Age of Onset   Hypertension Mother    Diabetes type II Mother    Diabetes Mother    COPD Father    Cancer Father    Heart disease Father    Diabetes Sister    Anxiety disorder Sister    Depression Sister    Diabetes Brother    Anxiety disorder Brother    Depression Brother    Diabetes Maternal Aunt    Diabetes Sister    Anxiety disorder Sister    Depression Sister    Diabetes Sister    Anxiety disorder Sister    Depression Sister    Diabetes Sister    Anxiety disorder Sister    Depression Sister    Colon cancer Maternal Uncle     Social History: Reviewed social history from progress note on 05/23/2018. Social History   Socioeconomic History   Marital status: Divorced    Spouse name: Not on file   Number of children: 4   Years of education: Not on file   Highest education level: 8th grade  Occupational History   Not on file  Tobacco Use   Smoking status: Every Day    Packs/day: 1.00    Years: 40.00    Additional pack years: 0.00    Total pack years: 40.00    Types: Cigarettes   Smokeless tobacco: Former    Types: Chew   Tobacco comments:    1 pack per day reported 01/03/2021  Vaping Use   Vaping Use: Former  Substance and Sexual Activity   Alcohol use: No    Alcohol/week: 0.0  standard drinks of alcohol   Drug use: No   Sexual activity: Not Currently  Other Topics Concern   Not on file  Social History Narrative   Not on file   Social Determinants of Health   Financial Resource Strain: Low Risk  (08/17/2020)   Overall Financial Resource Strain (CARDIA)    Difficulty of Paying Living Expenses: Not very hard  Food Insecurity: Food Insecurity Present (05/23/2018)   Hunger Vital Sign    Worried About Running Out of Food in the Last Year: Often true    Ran Out of Food in the Last Year: Often true  Transportation Needs:  No Transportation Needs (05/23/2018)   PRAPARE - Hydrologist (Medical): No    Lack of Transportation (Non-Medical): No  Physical Activity: Inactive (05/23/2018)   Exercise Vital Sign    Days of Exercise per Week: 0 days    Minutes of Exercise per Session: 0 min  Stress: Stress Concern Present (05/23/2018)   [place]    Feeling of Stress : Very much  Social Connections: Unknown (05/23/2018)   Social Connection and Isolation Panel [NHANES]    Frequency of Communication with Friends and Family: Not on file    Frequency of Social Gatherings with Friends and Family: Not on file    Attends Religious Services: Never    Active Member of Clubs or Organizations: No    Attends Archivist Meetings: Never    Marital Status: Divorced    Allergies:  Allergies  Allergen Reactions   Onion Anaphylaxis   Hydrocodone    Nicoderm [Nicotine]    Norco [Hydrocodone-Acetaminophen] Itching   Hydrocodone-Acetaminophen Itching   Nickel Rash    Metabolic Disorder Labs: Lab Results  Component Value Date   HGBA1C 8.9 (A) 04/19/2022   MPG 294.83 05/31/2017   No results found for: "PROLACTIN" Lab Results  Component Value Date   CHOL 151 11/11/2021   TRIG 251 (H) 11/11/2021   HDL 34 (L) 11/11/2021   CHOLHDL 4.4 11/11/2021   VLDL 26 12/09/2012   LDLCALC  76 11/11/2021   LDLCALC 153 (H) 08/17/2020   Lab Results  Component Value Date   TSH 1.890 08/17/2020   TSH 4.120 06/10/2018    Therapeutic Level Labs: No results found for: "LITHIUM" No results found for: "VALPROATE" No results found for: "CBMZ"  Current Medications: Current Outpatient Medications  Medication Sig Dispense Refill   ACCU-CHEK FASTCLIX LANCETS MISC yse as directed twice a day 100 each 1   albuterol (VENTOLIN HFA) 108 (90 Base) MCG/ACT inhaler INHALE 2 PUFFS INTO THE LUNGS EVERY 6 HOURS AS MEEDED FOR WHEEZING OR SHORTNESS OF BREATH 18 g 1   Albuterol Sulfate (PROAIR RESPICLICK) 123XX123 (90 Base) MCG/ACT AEPB Inhale 2 puff by po every  hrs as needed for wheezing for SOB 1 each 5   aspirin EC 81 MG tablet Take 81 mg by mouth daily at 3 pm.      atorvastatin (LIPITOR) 40 MG tablet Take 1 tablet (40 mg total) by mouth daily. 90 tablet 3   BREZTRI AEROSPHERE 160-9-4.8 MCG/ACT AERO INHALE 2 PUFFS TWICE DAILY 11 g 0   Continuous Blood Gluc Sensor (FREESTYLE LIBRE 2 SENSOR) MISC USE 1 SENSOR EVERY 14 DAYS 9 each 1   dapagliflozin propanediol (FARXIGA) 10 MG TABS tablet Take 1 tablet (10 mg total) by mouth daily before breakfast. 90 tablet 2   diclofenac (VOLTAREN) 75 MG EC tablet Take 75 mg by mouth 2 (two) times daily.     DULoxetine (CYMBALTA) 60 MG capsule Take 1 capsule (60 mg total) by mouth daily. Take along with 30 mg daily 90 capsule 1   ezetimibe (ZETIA) 10 MG tablet Take 1 tablet (10 mg total) by mouth daily. 90 tablet 3   famotidine (PEPCID) 20 MG tablet Take 1 tablet (20 mg total) by mouth 2 (two) times daily. 180 tablet 1   gabapentin (NEURONTIN) 600 MG tablet Take 0.5 tablets (300 mg total) by mouth 2 (two) times daily. May increase to 1 whole tablet in PM after 1 week. 60 tablet 3  glucose blood (ACCU-CHEK AVIVA PLUS) test strip Blood sugar testing TID and as needed. E11.65 100 each 12   insulin glargine, 2 Unit Dial, (TOUJEO MAX SOLOSTAR) 300 UNIT/ML Solostar Pen  Inject 70 Units into the skin daily. May decrease to 40 units at bedtime if having episodes of hypoglycemia. 18 mL 3   Insulin Pen Needle 32G X 4 MM MISC Use with daily dose insulin and with sliding scale insulin as needed. 100 each 5   MELATONIN PO Take 10 mg by mouth daily.     metoprolol tartrate (LOPRESSOR) 100 MG tablet Take 1 tablet (100 mg total) by mouth 2 (two) times daily. 180 tablet 1   MOUNJARO 5 MG/0.5ML Pen INJECT 5MG  SUBCUTANEOUSLY ONCE WEEKLY 4 mL 0   nitroGLYCERIN (NITROSTAT) 0.4 MG SL tablet Place 1 tablet (0.4 mg total) under the tongue every 5 (five) minutes x 3 doses as needed for chest pain. 30 tablet 1   pantoprazole (PROTONIX) 40 MG tablet Take 1 tablet (40 mg total) by mouth daily. 90 tablet 1   polyethylene glycol (MIRALAX) 17 g packet Take 17 g by mouth daily. 30 each 0   temazepam (RESTORIL) 30 MG capsule TAKE 1 CAPSULE BY MOUTH AT BEDTIME AS NEEDED FOR SLEEP 30 capsule 2   buPROPion (WELLBUTRIN XL) 150 MG 24 hr tablet Take 1 tablet (150 mg total) by mouth daily. Take along with 300 mg daily (Patient not taking: Reported on 07/19/2022) 90 tablet 1   buPROPion (WELLBUTRIN XL) 300 MG 24 hr tablet Take 1 tablet (300 mg total) by mouth daily. Take along with 150 mg daily (Patient not taking: Reported on 07/19/2022) 90 tablet 1   DULoxetine (CYMBALTA) 30 MG capsule Take 1 capsule (30 mg total) by mouth daily. Take along with 60 mg daily, total of 90 mg daily 90 capsule 1   hydrOXYzine (VISTARIL) 25 MG capsule Take 1-2 capsules (25-50 mg total) by mouth at bedtime as needed. (Patient not taking: Reported on 07/19/2022) 60 capsule 3   No current facility-administered medications for this visit.     Musculoskeletal: Strength & Muscle Tone:  UTA Gait & Station:  Seated Patient leans: N/A  Psychiatric Specialty Exam: Review of Systems  Musculoskeletal:  Positive for arthralgias.  Psychiatric/Behavioral:  Positive for sleep disturbance (ok as long as pain managed).   All  other systems reviewed and are negative.   There were no vitals taken for this visit.There is no height or weight on file to calculate BMI.  General Appearance: Casual  Eye Contact:  Fair  Speech:  Clear and Coherent  Volume:  Normal  Mood:  Euthymic  Affect:  Congruent  Thought Process:  Goal Directed and Descriptions of Associations: Intact  Orientation:  Full (Time, Place, and Person)  Thought Content: Logical   Suicidal Thoughts:  No  Homicidal Thoughts:  No  Memory:  Immediate;   Fair Recent;   Fair Remote;   Fair  Judgement:  Fair  Insight:  Fair  Psychomotor Activity:  Normal  Concentration:  Concentration: Fair and Attention Span: Fair  Recall:  [employer] of Knowledge: Fair  Language: Fair  Akathisia:  No  Handed:  Right  AIMS (if indicated): not done  Assets:  Communication Skills Desire for Improvement Housing Social Support  ADL's:  Intact  Cognition: WNL  Sleep:   ok as long as pain controlled   Screenings: [place] Office Visit from 02/09/2022 in [facility]  Video Visit from 12/20/2021 in [place] Video Visit from 06/20/2021 in [place] Office Visit from 11/22/2020 in [facility]  Total GAD-7 Score 7 6 8 11       [place] Office Visit from 10/19/2021 in [facility], [place] from 10/18/2020 in [facility], [place] from 10/17/2019 in [facility] [place] from 10/08/2018 in [facility] [place] from 10/05/2017 in [facility]  Total Score (max 30 points ) 30 29 30 27 30       PHQ2-9    [place] Visit from 02/09/2022 in [place] Video Visit from 12/20/2021 in [place] Office Visit from 10/19/2021 in [facility], [facility] Office Visit from 07/12/2021 in [facility], [facility] [place] Video Visit from 06/20/2021 in [place]  PHQ-2 Total Score 2 1 0 0 1  PHQ-9 Total Score 8 -- -- -- --      Flowsheet Row Video Visit from 07/19/2022 in [place] [facility] from 04/13/2022 in [facility] at [facility] Visit from 02/09/2022 in [place] No Risk No Risk No Risk        Assessment and Plan: Raymond [name] is a 57 year old Caucasian male, lives in [place], divorced, has a history of MDD, insomnia, diabetes mellitus, was evaluated by telemedicine today.  Patient is currently stable.  Plan as noted below.  Plan MDD in remission Cymbalta 90 mg p.o. daily Patient to clarify whether he is still taking the Wellbutrin XL 450 mg p.o. daily.  Patient will reach out once he confirms.  Other specified anxiety disorder-generalized anxiety not occurring more days than not-stable Cymbalta 90 mg p.o. daily  Insomnia-stable Temazepam 30 mg p.o. nightly Hydroxyzine 25-50 mg p.o. nightly as needed Patient encouraged to continue CPAP for OSA Sleep study-01/05/2021-Periodic limb movement Reviewed [place] PMP AWARxE  PTSD-stable Cymbalta 90 mg p.o. daily  Tobacco use disorder-unstable Provided counseling for 1 minute.  Provided resources like [place] quit now.   Follow-up and then again in 3 months or sooner if needed.  Consent: Patient/Guardian gives verbal consent for treatment and assignment of benefits for services provided during this visit. Patient/Guardian expressed understanding and agreed to proceed.   This note was generated in part or whole with voice recognition software. Voice recognition is usually quite accurate but there are transcription errors that can and  very often do occur. I apologize for any typographical errors that were not detected and corrected.    Ursula Alert, MD 07/19/2022, 9:26 AM

## 2022-07-27 ENCOUNTER — Encounter: Payer: Self-pay | Admitting: Nurse Practitioner

## 2022-07-27 ENCOUNTER — Ambulatory Visit (INDEPENDENT_AMBULATORY_CARE_PROVIDER_SITE_OTHER): Payer: 59 | Admitting: Nurse Practitioner

## 2022-07-27 VITALS — BP 177/100 | HR 65 | Temp 96.2°F | Resp 16 | Ht 69.0 in | Wt 226.9 lb

## 2022-07-27 DIAGNOSIS — Z760 Encounter for issue of repeat prescription: Secondary | ICD-10-CM

## 2022-07-27 DIAGNOSIS — E1165 Type 2 diabetes mellitus with hyperglycemia: Secondary | ICD-10-CM

## 2022-07-27 DIAGNOSIS — K219 Gastro-esophageal reflux disease without esophagitis: Secondary | ICD-10-CM | POA: Diagnosis not present

## 2022-07-27 DIAGNOSIS — R3912 Poor urinary stream: Secondary | ICD-10-CM | POA: Diagnosis not present

## 2022-07-27 DIAGNOSIS — Z125 Encounter for screening for malignant neoplasm of prostate: Secondary | ICD-10-CM | POA: Diagnosis not present

## 2022-07-27 LAB — POCT GLYCOSYLATED HEMOGLOBIN (HGB A1C): Hemoglobin A1C: 7.7 % — AB (ref 4.0–5.6)

## 2022-07-27 MED ORDER — FREESTYLE LIBRE 3 SENSOR MISC: 3 refills | Status: DC

## 2022-07-27 MED ORDER — DAPAGLIFLOZIN PROPANEDIOL 10 MG PO TABS: 10.0000 mg | ORAL_TABLET | Freq: Every day | ORAL | 2 refills | Status: DC

## 2022-07-27 MED ORDER — TIRZEPATIDE 7.5 MG/0.5ML ~~LOC~~ SOAJ: 7.5000 mg | SUBCUTANEOUS | 5 refills | Status: DC

## 2022-07-27 MED ORDER — TAMSULOSIN HCL 0.4 MG PO CAPS: 0.4000 mg | ORAL_CAPSULE | Freq: Every day | ORAL | 3 refills | Status: DC

## 2022-07-27 MED ORDER — FAMOTIDINE 20 MG PO TABS: 20.0000 mg | ORAL_TABLET | Freq: Two times a day (BID) | ORAL | 1 refills | Status: DC

## 2022-07-27 MED ORDER — PANTOPRAZOLE SODIUM 40 MG PO TBEC: 40.0000 mg | DELAYED_RELEASE_TABLET | Freq: Every day | ORAL | 1 refills | Status: DC

## 2022-07-27 MED ORDER — FREESTYLE LIBRE 2 SENSOR MISC: 1 refills | Status: DC

## 2022-07-27 NOTE — Progress Notes (Signed)
[facility] [address] [place], [place] [address]  Internal MEDICINE  Office Visit Note  Patient Name: Raymond [name]  [id number]  [id number]  Date of Service: 07/27/2022  Chief Complaint  Patient presents with   Depression   Diabetes   Gastroesophageal Reflux   Hypertension   Hyperlipidemia   Follow-up    HPI Raymond [name] presents for a follow-up visit for diabetes, hypertension Diabetes -- A1c significantly improved to 7.7 from 8.9. wants to increase mounjaro dose. Uses freestyle libre CGM. Refills due now  GERD -- stable with current medications , due for refills.  Weak urine stream -- recent change, also has hesitancy, need to check PSA level Due for refills, medication list review    Current Medication: Outpatient Encounter Medications as of 07/27/2022  Medication Sig   ACCU-CHEK FASTCLIX LANCETS MISC yse as directed twice a day   albuterol (VENTOLIN HFA) 108 (90 Base) MCG/ACT inhaler INHALE 2 PUFFS INTO THE LUNGS EVERY 6 HOURS AS MEEDED FOR WHEEZING OR SHORTNESS OF BREATH   Albuterol Sulfate (PROAIR RESPICLICK) 108 (90 Base) MCG/ACT AEPB Inhale 2 puff by po every  hrs as needed for wheezing for SOB   aspirin EC 81 MG tablet Take 81 mg by mouth daily at 3 pm.    atorvastatin (LIPITOR) 40 MG tablet Take 1 tablet (40 mg total) by mouth daily.   BREZTRI AEROSPHERE 160-9-4.8 MCG/ACT AERO INHALE 2 PUFFS TWICE DAILY   buPROPion (WELLBUTRIN XL) 150 MG 24 hr tablet Take 1 tablet (150 mg total) by mouth daily. Take along with 300 mg daily   buPROPion (WELLBUTRIN XL) 300 MG 24 hr tablet Take 1 tablet (300 mg total) by mouth daily. Take along with 150 mg daily   Continuous Blood Gluc Sensor (FREESTYLE LIBRE 3 SENSOR) MISC Apply 1 sensor to the skin every 14 days   diclofenac (VOLTAREN) 75 MG EC tablet Take 75 mg by mouth 2 (two) times daily.   DULoxetine (CYMBALTA) 30 MG capsule Take 1 capsule (30 mg total) by mouth daily. Take along with 60 mg daily, total of 90 mg daily    DULoxetine (CYMBALTA) 60 MG capsule Take 1 capsule (60 mg total) by mouth daily. Take along with 30 mg daily   ezetimibe (ZETIA) 10 MG tablet Take 1 tablet (10 mg total) by mouth daily.   gabapentin (NEURONTIN) 600 MG tablet Take 0.5 tablets (300 mg total) by mouth 2 (two) times daily. May increase to 1 whole tablet in PM after 1 week.   glucose blood (ACCU-CHEK AVIVA PLUS) test strip Blood sugar testing TID and as needed. E11.65   hydrOXYzine (VISTARIL) 25 MG capsule Take 1-2 capsules (25-50 mg total) by mouth at bedtime as needed.   insulin glargine, 2 Unit Dial, (TOUJEO MAX SOLOSTAR) 300 UNIT/ML Solostar Pen Inject 70 Units into the skin daily. May decrease to 40 units at bedtime if having episodes of hypoglycemia.   Insulin Pen Needle 32G X 4 MM MISC Use with daily dose insulin and with sliding scale insulin as needed.   MELATONIN PO Take 10 mg by mouth daily.   metoprolol tartrate (LOPRESSOR) 100 MG tablet Take 1 tablet (100 mg total) by mouth 2 (two) times daily.   nitroGLYCERIN (NITROSTAT) 0.4 MG SL tablet Place 1 tablet (0.4 mg total) under the tongue every 5 (five) minutes x 3 doses as needed for chest pain.   polyethylene glycol (MIRALAX) 17 g packet Take 17 g by mouth daily.   tamsulosin (FLOMAX) 0.4 MG  CAPS capsule Take 1 capsule (0.4 mg total) by mouth daily.   temazepam (RESTORIL) 30 MG capsule TAKE 1 CAPSULE BY MOUTH AT BEDTIME AS NEEDED FOR SLEEP   tirzepatide (MOUNJARO) 7.5 MG/0.5ML Pen Inject 7.5 mg into the skin once a week.   [DISCONTINUED] Continuous Blood Gluc Sensor (FREESTYLE LIBRE 2 SENSOR) MISC USE 1 SENSOR EVERY 14 DAYS   [DISCONTINUED] dapagliflozin propanediol (FARXIGA) 10 MG TABS tablet Take 1 tablet (10 mg total) by mouth daily before breakfast.   [DISCONTINUED] famotidine (PEPCID) 20 MG tablet Take 1 tablet (20 mg total) by mouth 2 (two) times daily.   [DISCONTINUED] MOUNJARO 5 MG/0.5ML Pen INJECT  SUBCUTANEOUSLY ONCE WEEKLY   [DISCONTINUED] pantoprazole  (PROTONIX) 40 MG tablet Take 1 tablet (40 mg total) by mouth daily.   Continuous Blood Gluc Sensor (FREESTYLE LIBRE 2 SENSOR) MISC USE 1 SENSOR EVERY 14 DAYS   dapagliflozin propanediol (FARXIGA) 10 MG TABS tablet Take 1 tablet (10 mg total) by mouth daily before breakfast.   famotidine (PEPCID) 20 MG tablet Take 1 tablet (20 mg total) by mouth 2 (two) times daily.   pantoprazole (PROTONIX) 40 MG tablet Take 1 tablet (40 mg total) by mouth daily.   No facility-administered encounter medications on file as of 07/27/2022.    Surgical History: Past Surgical History:  Procedure Laterality Date    ingrown toenail removal     CHOLECYSTECTOMY N/A 01/23/2019   Procedure: LAPAROSCOPIC CHOLECYSTECTOMY;  Surgeon: Henrene Dodge, MD;  Location: [facility] ORS;  Service: General;  Laterality: N/A;   CLAVICLE SURGERY Right    COLONOSCOPY  2017   COLONOSCOPY WITH PROPOFOL N/A 08/25/2020   Procedure: COLONOSCOPY WITH PROPOFOL;  Surgeon: Pasty Spillers, MD;  Location: [facility];  Service: Endoscopy;  Laterality: N/A;   ESOPHAGOGASTRODUODENOSCOPY (EGD) WITH PROPOFOL N/A 08/25/2020   Procedure: ESOPHAGOGASTRODUODENOSCOPY (EGD) WITH PROPOFOL;  Surgeon: Pasty Spillers, MD;  Location: [facility] END[facility]COPY;  Service: Endoscopy;  Laterality: N/A;   MOUTH SURGERY     REMOVED ALL TEETH   SHOULDER SURGERY Right    UPPER GI ENDOSCOPY  2017    Medical History: Past Medical History:  Diagnosis Date   Anxiety    Arthritis    NECK   Bronchitis    Coronary artery disease    Depression    Diabetes mellitus without complication    Diastasis of rectus abdominis 06/19/2018   Dyspnea    DUE TO GALLBLADDER PER PT   Emphysema lung    Family history of adverse reaction to anesthesia    N/V, TROUBLE BREATHING COMING OUT OF ANESTHESIA   Fatty liver    GERD (gastroesophageal reflux disease)    Headache    MIGRAINES   Hyperlipidemia    Hypertension    Irregular heart beat unk   Myocardial infarction 2010   MILD     PTSD (post-traumatic stress disorder)    Sleep apnea    USES CPAP   Tachycardia     Family History: Family History  Problem Relation Age of Onset   Hypertension Mother    Diabetes type II Mother    Diabetes Mother    COPD Father    Cancer Father    Heart disease Father    Diabetes Sister    Anxiety disorder Sister    Depression Sister    Diabetes Brother    Anxiety disorder Brother    Depression Brother    Diabetes Maternal Aunt    Diabetes Sister    Anxiety disorder Sister  Depression Sister    Diabetes Sister    Anxiety disorder Sister    Depression Sister    Diabetes Sister    Anxiety disorder Sister    Depression Sister    Colon cancer Maternal Uncle     Social History   Socioeconomic History   Marital status: Divorced    Spouse name: Not on file   Number of children: 4   Years of education: Not on file   Highest education level: 8th grade  Occupational History   Not on file  Tobacco Use   Smoking status: Every Day    Packs/day: 1.00    Years: 40.00    Additional pack years: 0.00    Total pack years: 40.00    Types: Cigarettes   Smokeless tobacco: Former    Types: Chew   Tobacco comments:    1 pack per day reported 01/03/2021  Vaping Use   Vaping Use: Former  Substance and Sexual Activity   Alcohol use: No    Alcohol/week: 0.0 standard drinks of alcohol   Drug use: No   Sexual activity: Not Currently  Other Topics Concern   Not on file  Social History Narrative   Not on file   Social Determinants of Health   Financial Resource Strain: Low Risk  (08/17/2020)   Overall Financial Resource Strain (CARDIA)    Difficulty of Paying Living Expenses: Not very hard  Food Insecurity: Food Insecurity Present (05/23/2018)   Hunger Vital Sign    Worried About Running Out of Food in the Last Year: Often true    Ran Out of Food in the Last Year: Often true  Transportation Needs: No Transportation Needs (05/23/2018)   PRAPARE - Doctor, general practice (Medical): No    Lack of Transportation (Non-Medical): No  Physical Activity: Inactive (05/23/2018)   Exercise Vital Sign    Days of Exercise per Week: 0 days    Minutes of Exercise per Session: 0 min  Stress: Stress Concern Present (05/23/2018)   [employer] of Occupational Health - Occupational Stress Questionnaire    Feeling of Stress : Very much  Social Connections: Unknown (05/23/2018)   Social Connection and Isolation Panel [NHANES]    Frequency of Communication with Friends and Family: Not on file    Frequency of Social Gatherings with Friends and Family: Not on file    Attends Religious Services: Never    Database administrator or Organizations: No    Attends Banker Meetings: Never    Marital Status: Divorced  Catering manager Violence: Not At Risk (05/23/2018)   Humiliation, Afraid, Rape, and Kick questionnaire    Fear of Current or Ex-Partner: No    Emotionally Abused: No    Physically Abused: No    Sexually Abused: No      Review of Systems  Constitutional:  Negative for chills, fatigue and unexpected weight change.  HENT:  Negative for congestion, rhinorrhea, sneezing and sore throat.   Eyes:  Negative for redness.  Respiratory:  Negative for cough, chest tightness and shortness of breath.   Cardiovascular:  Negative for chest pain and palpitations.  Gastrointestinal:  Negative for abdominal pain, constipation, diarrhea, nausea and vomiting.  Genitourinary:  Negative for dysuria and frequency.  Musculoskeletal:  Negative for arthralgias, back pain, joint swelling and neck pain.  Skin:  Negative for rash.  Neurological: Negative.  Negative for tremors and numbness.  Hematological:  Negative for adenopathy. Does not bruise/bleed easily.  Psychiatric/Behavioral:  Negative for behavioral problems (Depression), sleep disturbance and suicidal ideas. The patient is not nervous/anxious.     Vital Signs: BP (!) 177/100   Pulse 65    Temp (!) 96.2 F (35.7 C)   Resp 16   Ht 5\' 9"  (1.753 m)   Wt 226 lb 14.4 oz (102.9 kg)   SpO2 99%   BMI 33.51 kg/m    Physical Exam Vitals reviewed.  Constitutional:      General: He is not in acute distress.    Appearance: Normal appearance. He is obese. He is not ill-appearing.  HENT:     Head: Normocephalic and atraumatic.  Eyes:     Pupils: Pupils are equal, round, and reactive to light.  Cardiovascular:     Rate and Rhythm: Normal rate and regular rhythm.  Pulmonary:     Effort: Pulmonary effort is normal. No respiratory distress.  Neurological:     Mental Status: He is alert and oriented to person, place, and time.  Psychiatric:        Mood and Affect: Mood normal.        Behavior: Behavior normal.        Assessment/Plan: 1. Uncontrolled type 2 diabetes mellitus with hyperglycemia A1c significantly improved, continue farxiga as prescribed. Continue to use freestyle libre to keep track of glucose levels Increased dose of mounjaro, start 7.5 mg dose.  - POCT glycosylated hemoglobin (Hb A1C) - tirzepatide (MOUNJARO) 7.5 MG/0.5ML Pen; Inject 7.5 mg into the skin once a week.  Dispense: 2 mL; Refill: 5 - dapagliflozin propanediol (FARXIGA) 10 MG TABS tablet; Take 1 tablet (10 mg total) by mouth daily before breakfast.  Dispense: 90 tablet; Refill: 2 - Continuous Blood Gluc Sensor (FREESTYLE LIBRE 2 SENSOR) MISC; USE 1 SENSOR EVERY 14 DAYS  Dispense: 9 each; Refill: 1 - Continuous Blood Gluc Sensor (FREESTYLE LIBRE 3 SENSOR) MISC; Apply 1 sensor to the skin every 14 days  Dispense: 6 each; Refill: 3  2. Weak urine stream PSA level ordered Referred to urology for further evaluation.  Tamsulosin prescribed to help urine stream and hesitancy. - PSA Total (Reflex To Free) - Ambulatory referral to Urology - tamsulosin (FLOMAX) 0.4 MG CAPS capsule; Take 1 capsule (0.4 mg total) by mouth daily.  Dispense: 30 capsule; Refill: 3  3. Gastroesophageal reflux disease without  esophagitis Continue pantoprazole and famotidine as prescribed, refills ordered.  - pantoprazole (PROTONIX) 40 MG tablet; Take 1 tablet (40 mg total) by mouth daily.  Dispense: 90 tablet; Refill: 1 - famotidine (PEPCID) 20 MG tablet; Take 1 tablet (20 mg total) by mouth 2 (two) times daily.  Dispense: 180 tablet; Refill: 1  4. Screening for prostate cancer PSA level ordered - PSA Total (Reflex To Free)   General Counseling: Abdel verbalizes understanding of the findings of todays visit and agrees with plan of treatment. I have discussed any further diagnostic evaluation that may be needed or ordered today. We also reviewed his medications today. he has been encouraged to call the office with any questions or concerns that should arise related to todays visit.    Orders Placed This Encounter  Procedures   PSA Total (Reflex To Free)   Ambulatory referral to Urology   POCT glycosylated hemoglobin (Hb A1C)    Meds ordered this encounter  Medications   tirzepatide (MOUNJARO) 7.5 MG/0.5ML Pen    Sig: Inject 7.5 mg into the skin once a week.    Dispense:  2 mL    Refill:  5   tamsulosin (FLOMAX) 0.4 MG CAPS capsule    Sig: Take 1 capsule (0.4 mg total) by mouth daily.    Dispense:  30 capsule    Refill:  3    New med, fill today.   pantoprazole (PROTONIX) 40 MG tablet    Sig: Take 1 tablet (40 mg total) by mouth daily.    Dispense:  90 tablet    Refill:  1   famotidine (PEPCID) 20 MG tablet    Sig: Take 1 tablet (20 mg total) by mouth 2 (two) times daily.    Dispense:  180 tablet    Refill:  1    To replace rx for ranitidine   dapagliflozin propanediol (FARXIGA) 10 MG TABS tablet    Sig: Take 1 tablet (10 mg total) by mouth daily before breakfast.    Dispense:  90 tablet    Refill:  2   Continuous Blood Gluc Sensor (FREESTYLE LIBRE 2 SENSOR) MISC    Sig: USE 1 SENSOR EVERY 14 DAYS    Dispense:  9 each    Refill:  1    Fill for 90 days   Continuous Blood Gluc Sensor  (FREESTYLE LIBRE 3 SENSOR) MISC    Sig: Apply 1 sensor to the skin every 14 days    Dispense:  6 each    Refill:  3    Dx code E11.65    Return in about 4 months (around 11/26/2022) for F/U, Recheck A1C, Lavoris Sparling PCP.   Total time spent:30 Minutes Time spent includes review of chart, medications, test results, and follow up plan with the patient.    Controlled Substance Database was reviewed by me.  This patient was seen by Sallyanne Kuster, FNP-C in collaboration with Dr. Beverely Risen as a part of collaborative care agreement.   Heddy Vidana R. Tedd Sias, MSN, FNP-C Internal medicine

## 2022-08-03 ENCOUNTER — Ambulatory Visit: Payer: Medicare Other | Admitting: Psychiatry

## 2022-08-12 ENCOUNTER — Encounter: Payer: Self-pay | Admitting: Nurse Practitioner

## 2022-08-16 ENCOUNTER — Encounter: Payer: Self-pay | Admitting: Urology

## 2022-08-16 ENCOUNTER — Ambulatory Visit: Payer: 59 | Admitting: Urology

## 2022-08-16 VITALS — BP 157/81 | HR 101 | Ht 69.0 in | Wt 230.0 lb

## 2022-08-16 DIAGNOSIS — R3912 Poor urinary stream: Secondary | ICD-10-CM

## 2022-08-16 DIAGNOSIS — R399 Unspecified symptoms and signs involving the genitourinary system: Secondary | ICD-10-CM

## 2022-08-16 LAB — MICROSCOPIC EXAMINATION: Bacteria, UA: NONE SEEN

## 2022-08-16 LAB — URINALYSIS, COMPLETE
Appearance Ur: NEGATIVE
Bilirubin, UA: NEGATIVE
Ketones, UA: NEGATIVE
Leukocytes,UA: NEGATIVE
Nitrite, UA: NEGATIVE
Protein,UA: NEGATIVE
RBC, UA: NEGATIVE
Specific Gravity, UA: 1.015 (ref 1.005–1.030)
Urobilinogen, Ur: 1 mg/dL (ref 0.2–1.0)
pH, UA: 5.5 (ref 5.0–7.5)

## 2022-08-16 LAB — BLADDER SCAN AMB NON-IMAGING: Scan Result: 12

## 2022-08-16 MED ORDER — GEMTESA 75 MG PO TABS: 75.0000 mg | ORAL_TABLET | Freq: Every day | ORAL | 0 refills | Status: DC

## 2022-08-16 NOTE — Progress Notes (Signed)
I, DeAsia L Maxie,acting as a scribe for Riki Altes, MD.,have documented all relevant documentation on the behalf of Riki Altes, MD,as directed by  Riki Altes, MD while in the presence of Riki Altes, MD.   I, Phyllis Ginger Abdulla,acting as a scribe for Riki Altes, MD.,have documented all relevant documentation on the behalf of Riki Altes, MD, as directed by  Riki Altes, MD while in the presence of Riki Altes, MD.  08/16/2022 4:15 PM   Raymond [name] Jan 17, 1966 [id number]  Referring provider: Sallyanne Kuster, NP [address] [place],  [place] [address]  Chief Complaint  Patient presents with   Other    HPI: Raymond [name] is a 57 y.o. male referred for evaluation of urinary hesitancy and weak stream.  Most bothersome symptoms are urinary hesitancy, weak stream, frequency, and post-voiding dribbling.  IPSS 20/35 No significant improvement on Tamsulosin 0.8 mg daily, which he has been taking for approximately 2 weeks. No dysuria or gross hematuria. No flank, abdominal, or pelvic pain.   PMH: Past Medical History:  Diagnosis Date   Anxiety    Arthritis    NECK   Bronchitis    Coronary artery disease    Depression    Diabetes mellitus without complication    Diastasis of rectus abdominis 06/19/2018   Dyspnea    DUE TO GALLBLADDER PER PT   Emphysema lung    Family history of adverse reaction to anesthesia    N/V, TROUBLE BREATHING COMING OUT OF ANESTHESIA   Fatty liver    GERD (gastroesophageal reflux disease)    Headache    MIGRAINES   Hyperlipidemia    Hypertension    Irregular heart beat unk   Myocardial infarction 2010   MILD    PTSD (post-traumatic stress disorder)    Sleep apnea    USES CPAP   Tachycardia     Surgical History: Past Surgical History:  Procedure Laterality Date    ingrown toenail removal     CHOLECYSTECTOMY N/A 01/23/2019   Procedure: LAPAROSCOPIC CHOLECYSTECTOMY;  Surgeon: Henrene Dodge, MD;   Location: [facility] ORS;  Service: General;  Laterality: N/A;   CLAVICLE SURGERY Right    COLONOSCOPY  2017   COLONOSCOPY WITH PROPOFOL N/A 08/25/2020   Procedure: COLONOSCOPY WITH PROPOFOL;  Surgeon: Pasty Spillers, MD;  Location: [facility];  Service: Endoscopy;  Laterality: N/A;   ESOPHAGOGASTRODUODENOSCOPY (EGD) WITH PROPOFOL N/A 08/25/2020   Procedure: ESOPHAGOGASTRODUODENOSCOPY (EGD) WITH PROPOFOL;  Surgeon: Pasty Spillers, MD;  Location: [facility];  Service: Endoscopy;  Laterality: N/A;   MOUTH SURGERY     REMOVED ALL TEETH   SHOULDER SURGERY Right    UPPER GI ENDOSCOPY  2017    Home Medications:  Allergies as of 08/16/2022       Reactions   Onion Anaphylaxis   Hydrocodone    Nicoderm [nicotine]    Norco [hydrocodone-acetaminophen] Itching   Hydrocodone-acetaminophen Itching   Nickel Rash        Medication List        Accurate as of August 16, 2022  4:15 PM. If you have any questions, ask your nurse or doctor.          Accu-Chek Aviva Plus test strip Generic drug: glucose blood Blood sugar testing TID and as needed. E11.65   Accu-Chek FastClix Lancets Misc yse as directed twice a day   Albuterol Sulfate 108 (90 Base) MCG/ACT Aepb Commonly known as: PROAIR RESPICLICK Inhale  2 puff by po every  hrs as needed for wheezing for SOB   albuterol 108 (90 Base) MCG/ACT inhaler Commonly known as: VENTOLIN HFA INHALE 2 PUFFS INTO THE LUNGS EVERY 6 HOURS AS MEEDED FOR WHEEZING OR SHORTNESS OF BREATH   aspirin EC 81 MG tablet Take 81 mg by mouth daily at 3 pm.   atorvastatin 40 MG tablet Commonly known as: LIPITOR Take 1 tablet (40 mg total) by mouth daily.   Breztri Aerosphere 160-9-4.8 MCG/ACT Aero Generic drug: Budeson-Glycopyrrol-Formoterol INHALE 2 PUFFS TWICE DAILY   buPROPion 300 MG 24 hr tablet Commonly known as: Wellbutrin XL Take 1 tablet (300 mg total) by mouth daily. Take along with 150 mg daily   buPROPion 150 MG 24 hr  tablet Commonly known as: Wellbutrin XL Take 1 tablet (150 mg total) by mouth daily. Take along with 300 mg daily   dapagliflozin propanediol 10 MG Tabs tablet Commonly known as: Farxiga Take 1 tablet (10 mg total) by mouth daily before breakfast.   diclofenac 75 MG EC tablet Commonly known as: VOLTAREN Take 75 mg by mouth 2 (two) times daily.   DULoxetine 60 MG capsule Commonly known as: Cymbalta Take 1 capsule (60 mg total) by mouth daily. Take along with 30 mg daily   DULoxetine 30 MG capsule Commonly known as: Cymbalta Take 1 capsule (30 mg total) by mouth daily. Take along with 60 mg daily, total of 90 mg daily   ezetimibe 10 MG tablet Commonly known as: ZETIA Take 1 tablet (10 mg total) by mouth daily.   famotidine 20 MG tablet Commonly known as: PEPCID Take 1 tablet (20 mg total) by mouth 2 (two) times daily.   FreeStyle Libre 2 Sensor Misc USE 1 SENSOR EVERY 14 DAYS   FreeStyle Libre 3 Sensor Misc Apply 1 sensor to the skin every 14 days   gabapentin 600 MG tablet Commonly known as: NEURONTIN Take 0.5 tablets (300 mg total) by mouth 2 (two) times daily. May increase to 1 whole tablet in PM after 1 week.   Gemtesa 75 MG Tabs Generic drug: Vibegron Take 1 tablet (75 mg total) by mouth daily. Started by: Riki Altes, MD   hydrOXYzine 25 MG capsule Commonly known as: Vistaril Take 1-2 capsules (25-50 mg total) by mouth at bedtime as needed.   Insulin Pen Needle 32G X 4 MM Misc Use with daily dose insulin and with sliding scale insulin as needed.   MELATONIN PO Take 10 mg by mouth daily.   metoprolol tartrate 100 MG tablet Commonly known as: LOPRESSOR Take 1 tablet (100 mg total) by mouth 2 (two) times daily.   nitroGLYCERIN 0.4 MG SL tablet Commonly known as: NITROSTAT Place 1 tablet (0.4 mg total) under the tongue every 5 (five) minutes x 3 doses as needed for chest pain.   pantoprazole 40 MG tablet Commonly known as: PROTONIX Take 1 tablet (40  mg total) by mouth daily.   polyethylene glycol 17 g packet Commonly known as: MiraLax Take 17 g by mouth daily.   tamsulosin 0.4 MG Caps capsule Commonly known as: FLOMAX Take 1 capsule (0.4 mg total) by mouth daily.   temazepam 30 MG capsule Commonly known as: RESTORIL TAKE 1 CAPSULE BY MOUTH AT BEDTIME AS NEEDED FOR SLEEP   tirzepatide 7.5 MG/0.5ML Pen Commonly known as: MOUNJARO Inject 7.5 mg into the skin once a week.   Toujeo Max SoloStar 300 UNIT/ML Solostar Pen Generic drug: insulin glargine (2 Unit Dial) Inject 70 Units  into the skin daily. May decrease to 40 units at bedtime if having episodes of hypoglycemia.        Allergies:  Allergies  Allergen Reactions   Onion Anaphylaxis   Hydrocodone    Nicoderm [Nicotine]    Norco [Hydrocodone-Acetaminophen] Itching   Hydrocodone-Acetaminophen Itching   Nickel Rash    Family History: Family History  Problem Relation Age of Onset   Hypertension Mother    Diabetes type II Mother    Diabetes Mother    COPD Father    Cancer Father    Heart disease Father    Diabetes Sister    Anxiety disorder Sister    Depression Sister    Diabetes Brother    Anxiety disorder Brother    Depression Brother    Diabetes Maternal Aunt    Diabetes Sister    Anxiety disorder Sister    Depression Sister    Diabetes Sister    Anxiety disorder Sister    Depression Sister    Diabetes Sister    Anxiety disorder Sister    Depression Sister    Colon cancer Maternal Uncle     Social History:  reports that he has been smoking cigarettes. He has a 40.00 pack-year smoking history. He has quit using smokeless tobacco.  His smokeless tobacco use included chew. He reports that he does not drink alcohol and does not use drugs.   Physical Exam: BP (!) 157/81   Pulse (!) 101   Ht 5\' 9"  (1.753 m)   Wt 230 lb (104.3 kg)   BMI 33.97 kg/m   Constitutional:  Alert and oriented, No acute distress. HEENT: [place] AT Respiratory: Normal  respiratory effort, no increased work of breathing. GI: Abdomen is soft, nontender, nondistended, no abdominal masses GU: Prostate 25 g, smooth without nodules. Psychiatric: Normal mood and affect.  Laboratory Data:  Urinalysis Dipstick 3+ glucose, microscopy negative.  Assessment & Plan:    1. Lower urinary tract symptoms PVR today is 12.  Continue Tamsulosin. Add Gemtesa 75 mg daily-samples given. One month follow up for symptom recheck, if still symptomatic we will perform cystoscopy at that visit.  I have reviewed the above documentation for accuracy and completeness, and I agree with the above.   Riki Altes, MD  [facility] [address] [place], [place] [address] [phone]

## 2022-08-18 ENCOUNTER — Encounter: Payer: Self-pay | Admitting: Urology

## 2022-08-23 ENCOUNTER — Encounter: Payer: Self-pay | Admitting: Emergency Medicine

## 2022-08-23 ENCOUNTER — Emergency Department
Admission: EM | Admit: 2022-08-23 | Discharge: 2022-08-23 | Disposition: A | Discharge status: Home / Self Care | Payer: 59 | Attending: Emergency Medicine | Admitting: Emergency Medicine

## 2022-08-23 ENCOUNTER — Other Ambulatory Visit: Payer: Self-pay | Admitting: Nurse Practitioner

## 2022-08-23 ENCOUNTER — Emergency Department: Payer: 59

## 2022-08-23 ENCOUNTER — Other Ambulatory Visit: Payer: Self-pay

## 2022-08-23 DIAGNOSIS — K760 Fatty (change of) liver, not elsewhere classified: Secondary | ICD-10-CM | POA: Diagnosis not present

## 2022-08-23 DIAGNOSIS — R1011 Right upper quadrant pain: Secondary | ICD-10-CM | POA: Insufficient documentation

## 2022-08-23 DIAGNOSIS — I700 Atherosclerosis of aorta: Secondary | ICD-10-CM

## 2022-08-23 DIAGNOSIS — R1031 Right lower quadrant pain: Secondary | ICD-10-CM | POA: Diagnosis not present

## 2022-08-23 DIAGNOSIS — E1165 Type 2 diabetes mellitus with hyperglycemia: Secondary | ICD-10-CM | POA: Diagnosis not present

## 2022-08-23 DIAGNOSIS — R1013 Epigastric pain: Secondary | ICD-10-CM | POA: Diagnosis not present

## 2022-08-23 DIAGNOSIS — R109 Unspecified abdominal pain: Secondary | ICD-10-CM

## 2022-08-23 DIAGNOSIS — K5900 Constipation, unspecified: Secondary | ICD-10-CM | POA: Diagnosis not present

## 2022-08-23 LAB — CBC
HCT: 45.1 % (ref 39.0–52.0)
Hemoglobin: 14.9 g/dL (ref 13.0–17.0)
MCH: 30.5 pg (ref 26.0–34.0)
MCHC: 33 g/dL (ref 30.0–36.0)
MCV: 92.2 fL (ref 80.0–100.0)
Platelets: 230 10*3/uL (ref 150–400)
RBC: 4.89 MIL/uL (ref 4.22–5.81)
RDW: 13.2 % (ref 11.5–15.5)
WBC: 10.2 10*3/uL (ref 4.0–10.5)
nRBC: 0 % (ref 0.0–0.2)

## 2022-08-23 LAB — URINALYSIS, ROUTINE W REFLEX MICROSCOPIC
Bacteria, UA: NONE SEEN
Bilirubin Urine: NEGATIVE
Glucose, UA: >=500 mg/dL — AB
Hgb urine dipstick: NEGATIVE
Ketones, ur: NEGATIVE mg/dL
Leukocytes,Ua: NEGATIVE
Nitrite: NEGATIVE
Protein, ur: NEGATIVE mg/dL
Specific Gravity, Urine: 1.026 (ref 1.005–1.030)
Squamous Epithelial / HPF: NONE SEEN /HPF (ref 0–5)
WBC, UA: NONE SEEN WBC/hpf (ref 0–5)
pH: 5 (ref 5.0–8.0)

## 2022-08-23 LAB — BASIC METABOLIC PANEL
Anion gap: 8 (ref 5–15)
BUN: 15 mg/dL (ref 6–20)
CO2: 23 mmol/L (ref 22–32)
Calcium: 9 mg/dL (ref 8.9–10.3)
Chloride: 104 mmol/L (ref 98–111)
Creatinine, Ser: 1.18 mg/dL (ref 0.61–1.24)
GFR, Estimated: >60 mL/min (ref >60)
Glucose, Bld: 245 mg/dL — ABNORMAL HIGH (ref 70–99)
Potassium: 4.4 mmol/L (ref 3.5–5.1)
Sodium: 135 mmol/L (ref 135–145)

## 2022-08-23 LAB — HEPATIC FUNCTION PANEL
ALT: 17 U/L (ref 0–44)
AST: 21 U/L (ref 15–41)
Albumin: 3.8 g/dL (ref 3.5–5.0)
Alkaline Phosphatase: 110 U/L (ref 38–126)
Bilirubin, Direct: <0.1 mg/dL (ref 0.0–0.2)
Total Bilirubin: 0.4 mg/dL (ref 0.3–1.2)
Total Protein: 7.2 g/dL (ref 6.5–8.1)

## 2022-08-23 LAB — D-DIMER, QUANTITATIVE: D-Dimer, Quant: 0.36 ug/mL-FEU (ref 0.00–0.50)

## 2022-08-23 LAB — LIPASE, BLOOD: Lipase: 26 U/L (ref 11–51)

## 2022-08-23 MED ORDER — ONDANSETRON 4 MG PO TBDP: 4.0000 mg | ORAL_TABLET | Freq: Three times a day (TID) | ORAL | 0 refills | Status: AC | PRN

## 2022-08-23 MED ORDER — KETOROLAC TROMETHAMINE 15 MG/ML IJ SOLN
15.0000 mg | Freq: Once | INTRAMUSCULAR | Status: AC
  Administered 2022-08-23: 15 mg via INTRAVENOUS
  Filled 2022-08-23: qty 1

## 2022-08-23 MED ORDER — OXYCODONE-ACETAMINOPHEN 5-325 MG PO TABS: 1.0000 | ORAL_TABLET | Freq: Four times a day (QID) | ORAL | 0 refills | Status: AC | PRN

## 2022-08-23 MED ORDER — IOHEXOL 300 MG/ML  SOLN
100.0000 mL | Freq: Once | INTRAMUSCULAR | Status: AC | PRN
  Administered 2022-08-23: 100 mL via INTRAVENOUS

## 2022-08-23 MED ORDER — SODIUM CHLORIDE 0.9 % IV BOLUS
1000.0000 mL | Freq: Once | INTRAVENOUS | Status: AC
  Administered 2022-08-23: 1000 mL via INTRAVENOUS

## 2022-08-23 NOTE — ED Provider Notes (Signed)
Assumed patient care from Corena Herter, MD. CT abdomen pelvis indicated some spinal stenosis at L4-L5 with possible paracentral disc protrusion but no other acute abnormality.  Upon recheck, patient was resting comfortably.  He was able to stand and ambulate easily.  He has a follow-up appointment with his PCP this week.  He did request a short course of pain medication until he can see his PCP.  I did inform him about possible stenosis and disc protrusion and I suspect that patient's right flank pain may be associated.  Given patient's uncontrolled type 2 diabetes, I do not feel comfortable starting him on a steroid at this time.  Will have patient keep his appointment with PCP and will prescribe patient a short course of Percocet for pain.   Pia Mau Valera, PA-C 08/23/22 2251    Corena Herter, MD 08/24/22 [phone]

## 2022-08-23 NOTE — ED Provider Notes (Addendum)
[facility] Provider Note    Event Date/Time   First MD Initiated Contact with Patient 08/23/22 1903     (approximate)   History   Flank Pain   HPI  Raymond [name] is a 57 y.o. male past medical history significant for diabetes, who presents to the emergency department with right-sided abdominal pain.  Right-sided abdominal pain that started earlier today.  States that it has been progressively worsening throughout the day.  Right upper quadrant abdominal pain that radiates to his right back.  Denies any falls or trauma.  Denies nausea, vomiting, dysuria, urinary urgency or frequency.  Denies any fever or chills.  No known history of kidney stones.  Prior cholecystectomy, uncertain of how long ago.  Denies any lower abdominal pain.     Physical Exam   Triage Vital Signs: ED Triage Vitals  Enc Vitals Group     BP 08/23/22 1827 (!) 152/77     Pulse Rate 08/23/22 1827 88     Resp 08/23/22 1827 20     Temp 08/23/22 1827 99.5 F (37.5 C)     Temp Source 08/23/22 1827 Oral     SpO2 08/23/22 1827 98 %     Weight 08/23/22 1825 230 lb (104.3 kg)     Height 08/23/22 1825 5\' 9"  (1.753 m)     Head Circumference --      Peak Flow --      Pain Score 08/23/22 1825 10     Pain Loc --      Pain Edu? --      Excl. in GC? --     Most recent vital signs: Vitals:   08/23/22 1827  BP: (!) 152/77  Pulse: 88  Resp: 20  Temp: 99.5 F (37.5 C)  SpO2: 98%    Physical Exam Constitutional:      General: He is in acute distress.     Appearance: He is well-developed.  HENT:     Head: Atraumatic.  Eyes:     Conjunctiva/sclera: Conjunctivae normal.  Cardiovascular:     Rate and Rhythm: Regular rhythm.  Pulmonary:     Effort: No respiratory distress.  Abdominal:     Tenderness: There is abdominal tenderness.     Comments: Right upper quadrant abdominal tenderness to palpation, right CVA tenderness.  No rebound or guarding.  Epigastric abdominal tenderness.   No lower abdominal tenderness to palpation.  Negative McBurney's point.  Musculoskeletal:        General: Normal range of motion.     Cervical back: Normal range of motion.     Right lower leg: No edema.     Left lower leg: No edema.  Skin:    General: Skin is warm.     Capillary Refill: Capillary refill takes less than 2 seconds.  Neurological:     Mental Status: He is alert. Mental status is at baseline.      IMPRESSION / MDM / ASSESSMENT AND PLAN / ED COURSE  I reviewed the triage vital signs and the nursing notes.  Differential diagnosis including retained gallstone, kidney stone, pyelonephritis, pancreatitis, urinary tract infection, musculoskeletal  Will start the patient on IV fluids, IV ketorolac     Labs (all labs ordered are listed, but only abnormal results are displayed) Labs interpreted as -    Labs Reviewed  URINALYSIS, ROUTINE W REFLEX MICROSCOPIC - Abnormal; Notable for the following components:      Result Value   Color, Urine  STRAW (*)    APPearance CLEAR (*)    Glucose, UA >=500 (*)    All other components within normal limits  BASIC METABOLIC PANEL - Abnormal; Notable for the following components:   Glucose, Bld 245 (*)    All other components within normal limits  CBC  HEPATIC FUNCTION PANEL  LIPASE, BLOOD       Lab work with hyperglycemia with glucose elevated at 240 but otherwise no findings consistent with DKA.  Significant amount of glucose in the urine but no signs of urinary tract infection.  No blood in the urine.  Given IV fluids and IV pain medication.  CT abdomen and pelvis for further evaluation.  PROCEDURES:  Critical Care performed: No  Procedures  Patient's presentation is most consistent with acute presentation with potential threat to life or bodily function.   MEDICATIONS ORDERED IN ED: Medications  sodium chloride 0.9 % bolus 1,000 mL (1,000 mLs Intravenous New Bag/Given 08/23/22 1949)  ketorolac (TORADOL) 15 MG/ML  injection 15 mg (15 mg Intravenous Given 08/23/22 1947)  iohexol (OMNIPAQUE) 300 MG/ML solution 100 mL (100 mLs Intravenous Contrast Given 08/23/22 2017)    FINAL CLINICAL IMPRESSION(S) / ED DIAGNOSES   Final diagnoses:  Flank pain  Right flank pain     Rx / DC Orders   ED Discharge Orders     None        Note:  This document was prepared using Dragon voice recognition software and may include unintentional dictation errors.   Corena Herter, MD 08/23/22 Kristopher Oppenheim    Corena Herter, MD 08/23/22 2035

## 2022-08-23 NOTE — ED Triage Notes (Signed)
Pt via POV from home. Pt R flank pain starting this AM. Denies NVD. Pt has a hx of urinary issues he states is due to his diabetes. Pt is A&Ox4 and NAD

## 2022-08-28 ENCOUNTER — Encounter: Payer: Self-pay | Admitting: Physician Assistant

## 2022-08-28 ENCOUNTER — Ambulatory Visit (INDEPENDENT_AMBULATORY_CARE_PROVIDER_SITE_OTHER): Payer: 59 | Admitting: Physician Assistant

## 2022-08-28 VITALS — BP 125/90 | HR 88 | Temp 97.6°F | Resp 16 | Ht 69.0 in | Wt 233.0 lb

## 2022-08-28 DIAGNOSIS — R109 Unspecified abdominal pain: Secondary | ICD-10-CM

## 2022-08-28 DIAGNOSIS — T148XXA Other injury of unspecified body region, initial encounter: Secondary | ICD-10-CM

## 2022-08-28 MED ORDER — CYCLOBENZAPRINE HCL 10 MG PO TABS
10.0000 mg | ORAL_TABLET | Freq: Every day | ORAL | 0 refills | Status: DC
Start: 2022-08-28 — End: 2022-10-02

## 2022-08-28 NOTE — Progress Notes (Signed)
[facility] [address] [place], [place] [address]  Internal MEDICINE  Office Visit Note  Patient Name: Raymond [name]  [id number]  [id number]  Date of Service: 09/01/2022  Chief Complaint  Patient presents with   Hospitalization Follow-up    Flank Pain    HPI Pt is here for ED follow up -Went to ED on 08/23/22 for Right flank pain -CT did not show any stones and urine was clear. Patient has weak urinary stream and is already seeing urology for management. No new urinary symptoms -He is seeing ortho for his knee currently, has been seen for other ortho conditions but not for back recently -CT shows spinal stenosis L4-L5 with right paracentral disc protrusion and patient may follow up with ortho about this finding -Area of tenderness is along thoracic region of right side, aches and the longer he is up the more it hurts -has been working on daughter's car more recently, unaware of any injury, but has been working more and may have strained a muscle working under hood and under car. Will try muscle relaxer and heating pad/topicals to help -No rash seen and no burning, just aching/tenderness -BP borderline likely due to pain and will monitor  Current Medication: Outpatient Encounter Medications as of 08/28/2022  Medication Sig   ACCU-CHEK FASTCLIX LANCETS MISC yse as directed twice a day   albuterol (VENTOLIN HFA) 108 (90 Base) MCG/ACT inhaler INHALE 2 PUFFS INTO THE LUNGS EVERY 6 HOURS AS MEEDED FOR WHEEZING OR SHORTNESS OF BREATH   Albuterol Sulfate (PROAIR RESPICLICK) 108 (90 Base) MCG/ACT AEPB Inhale 2 puff by po every  hrs as needed for wheezing for SOB   aspirin EC 81 MG tablet Take 81 mg by mouth daily at 3 pm.    atorvastatin (LIPITOR) 40 MG tablet Take 1 tablet by mouth once daily   BREZTRI AEROSPHERE 160-9-4.8 MCG/ACT AERO INHALE 2 PUFFS TWICE DAILY   buPROPion (WELLBUTRIN XL) 150 MG 24 hr tablet Take 1 tablet (150 mg total) by mouth daily. Take along with 300  mg daily   buPROPion (WELLBUTRIN XL) 300 MG 24 hr tablet Take 1 tablet (300 mg total) by mouth daily. Take along with 150 mg daily   Continuous Blood Gluc Sensor (FREESTYLE LIBRE 2 SENSOR) MISC USE 1 SENSOR EVERY 14 DAYS   Continuous Blood Gluc Sensor (FREESTYLE LIBRE 3 SENSOR) MISC Apply 1 sensor to the skin every 14 days   cyclobenzaprine (FLEXERIL) 10 MG tablet Take 1 tablet (10 mg total) by mouth at bedtime. Take one tab po qhs for back spasm prn only   dapagliflozin propanediol (FARXIGA) 10 MG TABS tablet Take 1 tablet (10 mg total) by mouth daily before breakfast.   diclofenac (VOLTAREN) 75 MG EC tablet Take 75 mg by mouth 2 (two) times daily.   DULoxetine (CYMBALTA) 30 MG capsule Take 1 capsule (30 mg total) by mouth daily. Take along with 60 mg daily, total of 90 mg daily   DULoxetine (CYMBALTA) 60 MG capsule Take 1 capsule (60 mg total) by mouth daily. Take along with 30 mg daily   ezetimibe (ZETIA) 10 MG tablet Take 1 tablet (10 mg total) by mouth daily.   famotidine (PEPCID) 20 MG tablet Take 1 tablet (20 mg total) by mouth 2 (two) times daily.   gabapentin (NEURONTIN) 600 MG tablet Take 0.5 tablets (300 mg total) by mouth 2 (two) times daily. May increase to 1 whole tablet in PM after 1 week.   glucose blood (ACCU-CHEK  AVIVA PLUS) test strip Blood sugar testing TID and as needed. E11.65   hydrOXYzine (VISTARIL) 25 MG capsule Take 1-2 capsules (25-50 mg total) by mouth at bedtime as needed.   insulin glargine, 2 Unit Dial, (TOUJEO MAX SOLOSTAR) 300 UNIT/ML Solostar Pen Inject 70 Units into the skin daily. May decrease to 40 units at bedtime if having episodes of hypoglycemia.   Insulin Pen Needle 32G X 4 MM MISC Use with daily dose insulin and with sliding scale insulin as needed.   MELATONIN PO Take 10 mg by mouth daily.   metoprolol tartrate (LOPRESSOR) 100 MG tablet Take 1 tablet (100 mg total) by mouth 2 (two) times daily.   nitroGLYCERIN (NITROSTAT) 0.4 MG SL tablet Place 1 tablet  (0.4 mg total) under the tongue every 5 (five) minutes x 3 doses as needed for chest pain.   [EXPIRED] ondansetron (ZOFRAN-ODT) 4 MG disintegrating tablet Take 1 tablet (4 mg total) by mouth every 8 (eight) hours as needed for up to 5 days.   pantoprazole (PROTONIX) 40 MG tablet Take 1 tablet (40 mg total) by mouth daily.   polyethylene glycol (MIRALAX) 17 g packet Take 17 g by mouth daily.   tamsulosin (FLOMAX) 0.4 MG CAPS capsule Take 1 capsule (0.4 mg total) by mouth daily.   temazepam (RESTORIL) 30 MG capsule TAKE 1 CAPSULE BY MOUTH AT BEDTIME AS NEEDED FOR SLEEP   tirzepatide (MOUNJARO) 7.5 MG/0.5ML Pen Inject 7.5 mg into the skin once a week.   Vibegron (GEMTESA) 75 MG TABS Take 1 tablet (75 mg total) by mouth daily.   No facility-administered encounter medications on file as of 08/28/2022.    Surgical History: Past Surgical History:  Procedure Laterality Date    ingrown toenail removal     CHOLECYSTECTOMY N/A 01/23/2019   Procedure: LAPAROSCOPIC CHOLECYSTECTOMY;  Surgeon: Henrene Dodge, MD;  Location: [facility] ORS;  Service: General;  Laterality: N/A;   CLAVICLE SURGERY Right    COLONOSCOPY  2017   COLONOSCOPY WITH PROPOFOL N/A 08/25/2020   Procedure: COLONOSCOPY WITH PROPOFOL;  Surgeon: Pasty Spillers, MD;  Location: [facility] ENDOSCOPY;  Service: Endoscopy;  Laterality: N/A;   ESOPHAGOGASTRODUODENOSCOPY (EGD) WITH PROPOFOL N/A 08/25/2020   Procedure: ESOPHAGOGASTRODUODENOSCOPY (EGD) WITH PROPOFOL;  Surgeon: Pasty Spillers, MD;  Location: [facility] ENDOSCOPY;  Service: Endoscopy;  Laterality: N/A;   MOUTH SURGERY     REMOVED ALL TEETH   SHOULDER SURGERY Right    UPPER GI ENDOSCOPY  2017    Medical History: Past Medical History:  Diagnosis Date   Anxiety    Arthritis    NECK   Bronchitis    Coronary artery disease    Depression    Diabetes mellitus without complication (HCC)    Diastasis of rectus abdominis 06/19/2018   Dyspnea    DUE TO GALLBLADDER PER PT   Emphysema lung  (HCC)    Family history of adverse reaction to anesthesia    N/V, TROUBLE BREATHING COMING OUT OF ANESTHESIA   Fatty liver    GERD (gastroesophageal reflux disease)    Headache    MIGRAINES   Hyperlipidemia    Hypertension    Irregular heart beat unk   Myocardial infarction (HCC) 2010   MILD    PTSD (post-traumatic stress disorder)    Sleep apnea    USES CPAP   Tachycardia     Family History: Family History  Problem Relation Age of Onset   Hypertension Mother    Diabetes type II Mother  Diabetes Mother    COPD Father    Cancer Father    Heart disease Father    Diabetes Sister    Anxiety disorder Sister    Depression Sister    Diabetes Brother    Anxiety disorder Brother    Depression Brother    Diabetes Maternal Aunt    Diabetes Sister    Anxiety disorder Sister    Depression Sister    Diabetes Sister    Anxiety disorder Sister    Depression Sister    Diabetes Sister    Anxiety disorder Sister    Depression Sister    Colon cancer Maternal Uncle     Social History   Socioeconomic History   Marital status: Divorced    Spouse name: Not on file   Number of children: 4   Years of education: Not on file   Highest education level: 8th grade  Occupational History   Not on file  Tobacco Use   Smoking status: Every Day    Packs/day: 1.00    Years: 40.00    Additional pack years: 0.00    Total pack years: 40.00    Types: Cigarettes   Smokeless tobacco: Former    Types: Chew   Tobacco comments:    1 pack per day reported 01/03/2021  Vaping Use   Vaping Use: Former  Substance and Sexual Activity   Alcohol use: No    Alcohol/week: 0.0 standard drinks of alcohol   Drug use: No   Sexual activity: Not Currently  Other Topics Concern   Not on file  Social History Narrative   Not on file   Social Determinants of Health   Financial Resource Strain: Low Risk  (08/17/2020)   Overall Financial Resource Strain (CARDIA)    Difficulty of Paying Living  Expenses: Not very hard  Food Insecurity: Food Insecurity Present (05/23/2018)   Hunger Vital Sign    Worried About Running Out of Food in the Last Year: Often true    Ran Out of Food in the Last Year: Often true  Transportation Needs: No Transportation Needs (05/23/2018)   PRAPARE - Administrator, Civil Service (Medical): No    Lack of Transportation (Non-Medical): No  Physical Activity: Inactive (05/23/2018)   Exercise Vital Sign    Days of Exercise per Week: 0 days    Minutes of Exercise per Session: 0 min  Stress: Stress Concern Present (05/23/2018)   [employer] of Occupational Health - Occupational Stress Questionnaire    Feeling of Stress : Very much  Social Connections: Unknown (05/23/2018)   Social Connection and Isolation Panel [NHANES]    Frequency of Communication with Friends and Family: Not on file    Frequency of Social Gatherings with Friends and Family: Not on file    Attends Religious Services: Never    Database administrator or Organizations: No    Attends Banker Meetings: Never    Marital Status: Divorced  Catering manager Violence: Not At Risk (05/23/2018)   Humiliation, Afraid, Rape, and Kick questionnaire    Fear of Current or Ex-Partner: No    Emotionally Abused: No    Physically Abused: No    Sexually Abused: No      Review of Systems  Constitutional:  Negative for chills, fatigue and unexpected weight change.  HENT:  Negative for congestion, rhinorrhea, sneezing and sore throat.   Eyes:  Negative for redness.  Respiratory:  Negative for cough, chest tightness and shortness  of breath.   Cardiovascular:  Negative for chest pain and palpitations.  Gastrointestinal:  Negative for abdominal pain, constipation, diarrhea, nausea and vomiting.  Genitourinary:  Positive for flank pain. Negative for dysuria and frequency.  Musculoskeletal:  Negative for arthralgias, back pain, joint swelling and neck pain.  Skin:  Negative for rash.   Neurological: Negative.  Negative for tremors and numbness.  Hematological:  Negative for adenopathy. Does not bruise/bleed easily.  Psychiatric/Behavioral:  Negative for behavioral problems (Depression), sleep disturbance and suicidal ideas. The patient is not nervous/anxious.     Vital Signs: BP (!) 125/90   Pulse 88   Temp 97.6 F (36.4 C)   Resp 16   Ht 5\' 9"  (1.753 m)   Wt 233 lb (105.7 kg)   SpO2 97%   BMI 34.41 kg/m    Physical Exam Vitals reviewed.  Constitutional:      General: He is not in acute distress.    Appearance: Normal appearance. He is obese. He is not ill-appearing.  HENT:     Head: Normocephalic and atraumatic.  Eyes:     Pupils: Pupils are equal, round, and reactive to light.  Cardiovascular:     Rate and Rhythm: Normal rate and regular rhythm.  Pulmonary:     Effort: Pulmonary effort is normal. No respiratory distress.  Abdominal:     Tenderness: There is no abdominal tenderness.  Musculoskeletal:        General: Tenderness present.     Comments: Tender to palpation along right side of mid back, no evidence of skin changes  Neurological:     Mental Status: He is alert and oriented to person, place, and time.  Psychiatric:        Mood and Affect: Mood normal.        Behavior: Behavior normal.        Assessment/Plan: 1. Acute right flank pain Will start flexeril as needed for possible muscle strain and may use topicals and heating pad. Will follow up with ortho regarding CT findings. If new or worsening symptoms return to ED - cyclobenzaprine (FLEXERIL) 10 MG tablet; Take 1 tablet (10 mg total) by mouth at bedtime. Take one tab po qhs for back spasm prn only  Dispense: 30 tablet; Refill: 0  2. Muscle strain - cyclobenzaprine (FLEXERIL) 10 MG tablet; Take 1 tablet (10 mg total) by mouth at bedtime. Take one tab po qhs for back spasm prn only  Dispense: 30 tablet; Refill: 0   General Counseling: Leamon verbalizes understanding of the  findings of todays visit and agrees with plan of treatment. I have discussed any further diagnostic evaluation that may be needed or ordered today. We also reviewed his medications today. he has been encouraged to call the office with any questions or concerns that should arise related to todays visit.    No orders of the defined types were placed in this encounter.   Meds ordered this encounter  Medications   cyclobenzaprine (FLEXERIL) 10 MG tablet    Sig: Take 1 tablet (10 mg total) by mouth at bedtime. Take one tab po qhs for back spasm prn only    Dispense:  30 tablet    Refill:  0    This patient was seen by Lynn Ito, PA-C in collaboration with Dr. Beverely Risen as a part of collaborative care agreement.   Total time spent:30 Minutes Time spent includes review of chart, medications, test results, and follow up plan with the patient.  Dr Lavera Guise Internal medicine

## 2022-08-31 ENCOUNTER — Ambulatory Visit
Admission: RE | Admit: 2022-08-31 | Discharge: 2022-08-31 | Disposition: A | Discharge status: Home / Self Care | Payer: 59 | Source: Ambulatory Visit | Attending: Acute Care | Admitting: Acute Care

## 2022-08-31 DIAGNOSIS — Z122 Encounter for screening for malignant neoplasm of respiratory organs: Secondary | ICD-10-CM | POA: Insufficient documentation

## 2022-08-31 DIAGNOSIS — F17210 Nicotine dependence, cigarettes, uncomplicated: Secondary | ICD-10-CM | POA: Diagnosis not present

## 2022-08-31 DIAGNOSIS — Z87891 Personal history of nicotine dependence: Secondary | ICD-10-CM | POA: Insufficient documentation

## 2022-09-04 ENCOUNTER — Telehealth: Payer: Self-pay | Admitting: Acute Care

## 2022-09-04 NOTE — Telephone Encounter (Signed)
Call report  °

## 2022-09-04 NOTE — Telephone Encounter (Signed)
Received a call from [facility] Radiology for patient's lung cancer screening CT results. Below is a copy of the impression:   IMPRESSION: 1. Lung-RADS 4B, suspicious. Additional imaging evaluation or consultation with Pulmonology or Thoracic Surgery recommended. New indistinct irregular solid superior segment right lower lobe pulmonary nodule measuring 12.5 mm in volume derived mean diameter, suspicious for primary bronchogenic malignancy. PET-CT suggested at this time for further characterization. 2. Two-vessel coronary atherosclerosis. 3. Aortic Atherosclerosis (ICD10-I70.0) and Emphysema (ICD10-J43.9).   These results will be called to the ordering clinician or representative by the Radiologist Assistant, and communication documented in the PACS or [employer].  Will route to Maralyn Sago and the lung nodule pool for follow up.

## 2022-09-06 ENCOUNTER — Telehealth: Payer: Self-pay | Admitting: Acute Care

## 2022-09-06 NOTE — Telephone Encounter (Signed)
I have called the patient with the results of his low dose Ct Chest. I explained that he had a nodule on the scan that needs further work up. He told me he had called his PCP who has made him an appointment with Dr. Welton Flakes, pulmonology at [facility],  to be seen. I have send Dr. Welton Flakes a staff message letting him know we will let him manage this finding as he is seeing the patient 09/12/2022 at 3:45 PM. I told him to reach out if we can be of any further assistance.  Pt. Verbalized understanding. Nothing further is needed.

## 2022-09-12 ENCOUNTER — Ambulatory Visit: Payer: 59 | Admitting: Internal Medicine

## 2022-09-12 ENCOUNTER — Encounter: Payer: Self-pay | Admitting: Internal Medicine

## 2022-09-12 VITALS — BP 140/95 | HR 138 | Temp 98.1°F | Resp 16 | Ht 69.0 in | Wt 238.0 lb

## 2022-09-12 DIAGNOSIS — J4489 Other specified chronic obstructive pulmonary disease: Secondary | ICD-10-CM | POA: Diagnosis not present

## 2022-09-12 DIAGNOSIS — R918 Other nonspecific abnormal finding of lung field: Secondary | ICD-10-CM | POA: Diagnosis not present

## 2022-09-12 NOTE — Progress Notes (Signed)
[facility] [address] [place], [place] [address]  Pulmonary Sleep Medicine   Office Visit Note  Patient Name: Raymond [name] DOB: 11/10/1965 MRN [id number]  Date of Service: 09/12/2022  Complaints/HPI: Patient is here for follow up for CT scan The scan shows suspicious lesion. He needs to have a PET scan to stage and determine the nature of the lesion. He had a benign finding in 2023  and 2022 and now has changed so therefore will need to know stage and then possibly go for CTS evaluation. Patient will also need PFT to be done. Last PFT was within normal limits  Office Spirometry Results:     ROS  General: (-) fever, (-) chills, (-) night sweats, (-) weakness Skin: (-) rashes, (-) itching,. Eyes: (-) visual changes, (-) redness, (-) itching. Nose and Sinuses: (-) nasal stuffiness or itchiness, (-) postnasal drip, (-) nosebleeds, (-) sinus trouble. Mouth and Throat: (-) sore throat, (-) hoarseness. Neck: (-) swollen glands, (-) enlarged thyroid, (-) neck pain. Respiratory: - cough, (-) bloody sputum, - shortness of breath, - wheezing. Cardiovascular: - ankle swelling, (-) chest pain. Lymphatic: (-) lymph node enlargement. Neurologic: (-) numbness, (-) tingling. Psychiatric: (-) anxiety, (-) depression   Current Medication: Outpatient Encounter Medications as of 09/12/2022  Medication Sig   ACCU-CHEK FASTCLIX LANCETS MISC yse as directed twice a day   albuterol (VENTOLIN HFA) 108 (90 Base) MCG/ACT inhaler INHALE 2 PUFFS INTO THE LUNGS EVERY 6 HOURS AS MEEDED FOR WHEEZING OR SHORTNESS OF BREATH   Albuterol Sulfate (PROAIR RESPICLICK) 108 (90 Base) MCG/ACT AEPB Inhale 2 puff by po every  hrs as needed for wheezing for SOB   aspirin EC 81 MG tablet Take 81 mg by mouth daily at 3 pm.    atorvastatin (LIPITOR) 40 MG tablet Take 1 tablet by mouth once daily   BREZTRI AEROSPHERE 160-9-4.8 MCG/ACT AERO INHALE 2 PUFFS TWICE DAILY   buPROPion (WELLBUTRIN XL) 150 MG 24  hr tablet Take 1 tablet (150 mg total) by mouth daily. Take along with 300 mg daily   buPROPion (WELLBUTRIN XL) 300 MG 24 hr tablet Take 1 tablet (300 mg total) by mouth daily. Take along with 150 mg daily   Continuous Blood Gluc Sensor (FREESTYLE LIBRE 2 SENSOR) MISC USE 1 SENSOR EVERY 14 DAYS   Continuous Blood Gluc Sensor (FREESTYLE LIBRE 3 SENSOR) MISC Apply 1 sensor to the skin every 14 days   cyclobenzaprine (FLEXERIL) 10 MG tablet Take 1 tablet (10 mg total) by mouth at bedtime. Take one tab po qhs for back spasm prn only   dapagliflozin propanediol (FARXIGA) 10 MG TABS tablet Take 1 tablet (10 mg total) by mouth daily before breakfast.   diclofenac (VOLTAREN) 75 MG EC tablet Take 75 mg by mouth 2 (two) times daily.   DULoxetine (CYMBALTA) 30 MG capsule Take 1 capsule (30 mg total) by mouth daily. Take along with 60 mg daily, total of 90 mg daily   DULoxetine (CYMBALTA) 60 MG capsule Take 1 capsule (60 mg total) by mouth daily. Take along with 30 mg daily   ezetimibe (ZETIA) 10 MG tablet Take 1 tablet (10 mg total) by mouth daily.   famotidine (PEPCID) 20 MG tablet Take 1 tablet (20 mg total) by mouth 2 (two) times daily.   gabapentin (NEURONTIN) 600 MG tablet Take 0.5 tablets (300 mg total) by mouth 2 (two) times daily. May increase to 1 whole tablet in PM after 1 week.   glucose blood (ACCU-CHEK  AVIVA PLUS) test strip Blood sugar testing TID and as needed. E11.65   hydrOXYzine (VISTARIL) 25 MG capsule Take 1-2 capsules (25-50 mg total) by mouth at bedtime as needed.   insulin glargine, 2 Unit Dial, (TOUJEO MAX SOLOSTAR) 300 UNIT/ML Solostar Pen Inject 70 Units into the skin daily. May decrease to 40 units at bedtime if having episodes of hypoglycemia.   Insulin Pen Needle 32G X 4 MM MISC Use with daily dose insulin and with sliding scale insulin as needed.   MELATONIN PO Take 10 mg by mouth daily.   metoprolol tartrate (LOPRESSOR) 100 MG tablet Take 1 tablet (100 mg total) by mouth 2 (two)  times daily.   nitroGLYCERIN (NITROSTAT) 0.4 MG SL tablet Place 1 tablet (0.4 mg total) under the tongue every 5 (five) minutes x 3 doses as needed for chest pain.   pantoprazole (PROTONIX) 40 MG tablet Take 1 tablet (40 mg total) by mouth daily.   polyethylene glycol (MIRALAX) 17 g packet Take 17 g by mouth daily.   tamsulosin (FLOMAX) 0.4 MG CAPS capsule Take 1 capsule (0.4 mg total) by mouth daily.   temazepam (RESTORIL) 30 MG capsule TAKE 1 CAPSULE BY MOUTH AT BEDTIME AS NEEDED FOR SLEEP   tirzepatide (MOUNJARO) 7.5 MG/0.5ML Pen Inject 7.5 mg into the skin once a week.   Vibegron (GEMTESA) 75 MG TABS Take 1 tablet (75 mg total) by mouth daily.   No facility-administered encounter medications on file as of 09/12/2022.    Surgical History: Past Surgical History:  Procedure Laterality Date    ingrown toenail removal     CHOLECYSTECTOMY N/A 01/23/2019   Procedure: LAPAROSCOPIC CHOLECYSTECTOMY;  Surgeon: Henrene Dodge, MD;  Location: [facility] ORS;  Service: General;  Laterality: N/A;   CLAVICLE SURGERY Right    COLONOSCOPY  2017   COLONOSCOPY WITH PROPOFOL N/A 08/25/2020   Procedure: COLONOSCOPY WITH PROPOFOL;  Surgeon: Pasty Spillers, MD;  Location: [facility] ENDOSCOPY;  Service: Endoscopy;  Laterality: N/A;   ESOPHAGOGASTRODUODENOSCOPY (EGD) WITH PROPOFOL N/A 08/25/2020   Procedure: ESOPHAGOGASTRODUODENOSCOPY (EGD) WITH PROPOFOL;  Surgeon: Pasty Spillers, MD;  Location: [facility] ENDOSCOPY;  Service: Endoscopy;  Laterality: N/A;   MOUTH SURGERY     REMOVED ALL TEETH   SHOULDER SURGERY Right    UPPER GI ENDOSCOPY  2017    Medical History: Past Medical History:  Diagnosis Date   Anxiety    Arthritis    NECK   Bronchitis    Coronary artery disease    Depression    Diabetes mellitus without complication (HCC)    Diastasis of rectus abdominis 06/19/2018   Dyspnea    DUE TO GALLBLADDER PER PT   Emphysema lung (HCC)    Family history of adverse reaction to anesthesia    N/V, TROUBLE  BREATHING COMING OUT OF ANESTHESIA   Fatty liver    GERD (gastroesophageal reflux disease)    Headache    MIGRAINES   Hyperlipidemia    Hypertension    Irregular heart beat unk   Myocardial infarction (HCC) 2010   MILD    PTSD (post-traumatic stress disorder)    Sleep apnea    USES CPAP   Tachycardia     Family History: Family History  Problem Relation Age of Onset   Hypertension Mother    Diabetes type II Mother    Diabetes Mother    COPD Father    Cancer Father    Heart disease Father    Diabetes Sister    Anxiety disorder  Sister    Depression Sister    Diabetes Brother    Anxiety disorder Brother    Depression Brother    Diabetes Maternal Aunt    Diabetes Sister    Anxiety disorder Sister    Depression Sister    Diabetes Sister    Anxiety disorder Sister    Depression Sister    Diabetes Sister    Anxiety disorder Sister    Depression Sister    Colon cancer Maternal Uncle     Social History: Social History   Socioeconomic History   Marital status: Divorced    Spouse name: Not on file   Number of children: 4   Years of education: Not on file   Highest education level: 8th grade  Occupational History   Not on file  Tobacco Use   Smoking status: Every Day    Packs/day: 1.00    Years: 40.00    Additional pack years: 0.00    Total pack years: 40.00    Types: Cigarettes   Smokeless tobacco: Former    Types: Chew   Tobacco comments:    1 pack per day reported 01/03/2021  Vaping Use   Vaping Use: Former  Substance and Sexual Activity   Alcohol use: No    Alcohol/week: 0.0 standard drinks of alcohol   Drug use: No   Sexual activity: Not Currently  Other Topics Concern   Not on file  Social History Narrative   Not on file   Social Determinants of Health   Financial Resource Strain: Low Risk  (08/17/2020)   Overall Financial Resource Strain (CARDIA)    Difficulty of Paying Living Expenses: Not very hard  Food Insecurity: Food Insecurity Present  (05/23/2018)   Hunger Vital Sign    Worried About Running Out of Food in the Last Year: Often true    Ran Out of Food in the Last Year: Often true  Transportation Needs: No Transportation Needs (05/23/2018)   PRAPARE - Administrator, Civil Service (Medical): No    Lack of Transportation (Non-Medical): No  Physical Activity: Inactive (05/23/2018)   Exercise Vital Sign    Days of Exercise per Week: 0 days    Minutes of Exercise per Session: 0 min  Stress: Stress Concern Present (05/23/2018)   [employer] of Occupational Health - Occupational Stress Questionnaire    Feeling of Stress : Very much  Social Connections: Unknown (05/23/2018)   Social Connection and Isolation Panel [NHANES]    Frequency of Communication with Friends and Family: Not on file    Frequency of Social Gatherings with Friends and Family: Not on file    Attends Religious Services: Never    Database administrator or Organizations: No    Attends Banker Meetings: Never    Marital Status: Divorced  Catering manager Violence: Not At Risk (05/23/2018)   Humiliation, Afraid, Rape, and Kick questionnaire    Fear of Current or Ex-Partner: No    Emotionally Abused: No    Physically Abused: No    Sexually Abused: No    Vital Signs: Blood pressure (!) 140/95, pulse (!) 138, temperature 98.1 F (36.7 C), resp. rate 16, height 5\' 9"  (1.753 m), weight 238 lb (108 kg), SpO2 97 %.  Examination: General Appearance: The patient is well-developed, well-nourished, and in no distress. Skin: Gross inspection of skin unremarkable. Head: normocephalic, no gross deformities. Eyes: no gross deformities noted. ENT: ears appear grossly normal no exudates. Neck: Supple. No thyromegaly.  No LAD. Respiratory: no rhonchi. Cardiovascular: Normal S1 and S2 without murmur or rub. Extremities: No cyanosis. pulses are equal. Neurologic: Alert and oriented. No involuntary movements.  LABS: Recent Results (from the  past 2160 hour(s))  POCT glycosylated hemoglobin (Hb A1C)     Status: Abnormal   Collection Time: 07/27/22  1:50 PM  Result Value Ref Range   Hemoglobin A1C 7.7 (A) 4.0 - 5.6 %   HbA1c POC (<> result, manual entry)     HbA1c, POC (prediabetic range)     HbA1c, POC (controlled diabetic range)    Urinalysis, Complete     Status: Abnormal   Collection Time: 08/16/22  2:40 PM  Result Value Ref Range   Specific Gravity, UA 1.015 1.005 - 1.030   pH, UA 5.5 5.0 - 7.5   Color, UA Clear Yellow   Appearance Ur Negative Clear   Leukocytes,UA Negative Negative   Protein,UA Negative Negative/Trace   Glucose, UA 3+ (A) Negative   Ketones, UA Negative Negative   RBC, UA Negative Negative   Bilirubin, UA Negative Negative   Urobilinogen, Ur 1.0 0.2 - 1.0 mg/dL   Nitrite, UA Negative Negative   Microscopic Examination See below:   Microscopic Examination     Status: None   Collection Time: 08/16/22  2:40 PM   Urine  Result Value Ref Range   WBC, UA 0-5 0 - 5 /hpf   RBC, Urine 0-2 0 - 2 /hpf   Epithelial Cells (non renal) 0-10 0 - 10 /hpf   Bacteria, UA None seen None seen/Few  Bladder Scan (Post Void Residual) in office     Status: None   Collection Time: 08/16/22  3:08 PM  Result Value Ref Range   Scan Result 12   Urinalysis, Routine w reflex microscopic -Urine, Clean Catch     Status: Abnormal   Collection Time: 08/23/22  6:27 PM  Result Value Ref Range   Color, Urine STRAW (A) YELLOW   APPearance CLEAR (A) CLEAR   Specific Gravity, Urine 1.026 1.005 - 1.030   pH 5.0 5.0 - 8.0   Glucose, UA >=500 (A) NEGATIVE mg/dL   Hgb urine dipstick NEGATIVE NEGATIVE   Bilirubin Urine NEGATIVE NEGATIVE   Ketones, ur NEGATIVE NEGATIVE mg/dL   Protein, ur NEGATIVE NEGATIVE mg/dL   Nitrite NEGATIVE NEGATIVE   Leukocytes,Ua NEGATIVE NEGATIVE   WBC, UA NONE SEEN 0 - 5 WBC/hpf   Bacteria, UA NONE SEEN NONE SEEN   Squamous Epithelial / HPF NONE SEEN 0 - 5 /HPF    Comment: Performed at [facility], [address] [place] [address] [place], [place] [address]  Basic metabolic panel     Status: Abnormal   Collection Time: 08/23/22  6:28 PM  Result Value Ref Range   Sodium 135 135 - 145 mmol/L   Potassium 4.4 3.5 - 5.1 mmol/L   Chloride 104 98 - 111 mmol/L   CO2 23 22 - 32 mmol/L   Glucose, Bld 245 (H) 70 - 99 mg/dL    Comment: Glucose reference range applies only to samples taken after fasting for at least 8 hours.   BUN 15 6 - 20 mg/dL   Creatinine, Ser 6.04 0.61 - 1.24 mg/dL   Calcium 9.0 8.9 - 54.0 mg/dL   GFR, Estimated >98 >11 mL/min    Comment: (NOTE) Calculated using the CKD-EPI Creatinine Equation (2021)    Anion gap 8 5 - 15    Comment: Performed at [facility], [address] [place], [place] [address]  CBC     Status: None   Collection Time: 08/23/22  6:28 PM  Result Value Ref Range   WBC 10.2 4.0 - 10.5 K/uL   RBC 4.89 4.22 - 5.81 MIL/uL   Hemoglobin 14.9 13.0 - 17.0 g/dL   HCT 19.1 47.8 - 29.5 %   MCV 92.2 80.0 - 100.0 fL   MCH 30.5 26.0 - 34.0 pg   MCHC 33.0 30.0 - 36.0 g/dL   RDW 62.1 30.8 - 65.7 %   Platelets 230 150 - 400 K/uL   nRBC 0.0 0.0 - 0.2 %    Comment: Performed at [facility], [address] [place], [place] [address]  Hepatic function panel     Status: None   Collection Time: 08/23/22  6:28 PM  Result Value Ref Range   Total Protein 7.2 6.5 - 8.1 g/dL   Albumin 3.8 3.5 - 5.0 g/dL   AST 21 15 - 41 U/L   ALT 17 0 - 44 U/L   Alkaline Phosphatase 110 38 - 126 U/L   Total Bilirubin 0.4 0.3 - 1.2 mg/dL   Bilirubin, Direct <2.9 0.0 - 0.2 mg/dL   Indirect Bilirubin NOT CALCULATED 0.3 - 0.9 mg/dL    Comment: Performed at [facility], [address] [place], [place] [address]  Lipase, blood     Status: None   Collection Time: 08/23/22  6:28 PM  Result Value Ref Range   Lipase 26 11 - 51 U/L    Comment: Performed at [facility], [address] [place], [place] [address]  D-dimer, quantitative      Status: None   Collection Time: 08/23/22  9:31 PM  Result Value Ref Range   D-Dimer, Quant 0.36 0.00 - 0.50 ug/mL-FEU    Comment: (NOTE) At the manufacturer cut-off value of 0.5 g/mL FEU, this assay has a negative predictive value of 95-100%.This assay is intended for use in conjunction with a clinical pretest probability (PTP) assessment model to exclude pulmonary embolism (PE) and deep venous thrombosis (DVT) in outpatients suspected of PE or DVT. Results should be correlated with clinical presentation. Performed at [facility], [address] [place], [place] [address]     Radiology: CT CHEST LUNG CA SCREEN LOW DOSE W/O CM  Result Date: 09/04/2022 CLINICAL DATA:  57 year old male current smoker with 42.5 pack-year smoking history. EXAM: CT CHEST WITHOUT CONTRAST LOW-DOSE FOR LUNG CANCER SCREENING TECHNIQUE: Multidetector CT imaging of the chest was performed following the standard protocol without IV contrast. RADIATION DOSE REDUCTION: This exam was performed according to the departmental dose-optimization program which includes automated exposure control, adjustment of the mA and/or kV according to patient size and/or use of iterative reconstruction technique. COMPARISON:  08/29/2021 screening chest CT. FINDINGS: Cardiovascular: Normal heart size. No significant pericardial effusion/thickening. Left anterior descending and left circumflex coronary atherosclerosis. Atherosclerotic nonaneurysmal thoracic aorta. Normal caliber pulmonary arteries. Mediastinum/Nodes: No significant thyroid nodules. Unremarkable esophagus. No pathologically enlarged axillary, mediastinal or hilar lymph nodes, noting limited sensitivity for the detection of hilar adenopathy on this noncontrast study. Lungs/Pleura: No pneumothorax. No pleural effusion. Mild centrilobular emphysema with diffuse bronchial wall thickening. No acute consolidative airspace disease or lung masses. No significant growth of  previously visualized pulmonary nodules. New indistinct irregular solid superior segment right lower lobe pulmonary nodule measuring 12.5 mm in volume derived mean diameter (series 4/image 129). No additional new significant pulmonary nodules. Upper abdomen: Cholecystectomy. Musculoskeletal: No aggressive appearing focal osseous  lesions. Moderate thoracic spondylosis. Partially visualized fixation hardware in the right clavicle shaft. IMPRESSION: 1. Lung-RADS 4B, suspicious. Additional imaging evaluation or consultation with Pulmonology or Thoracic Surgery recommended. New indistinct irregular solid superior segment right lower lobe pulmonary nodule measuring 12.5 mm in volume derived mean diameter, suspicious for primary bronchogenic malignancy. PET-CT suggested at this time for further characterization. 2. Two-vessel coronary atherosclerosis. 3. Aortic Atherosclerosis (ICD10-I70.0) and Emphysema (ICD10-J43.9). These results will be called to the ordering clinician or representative by the Radiologist Assistant, and communication documented in the PACS or [employer]. Electronically Signed   By: Delbert Phenix M.D.   On: 09/04/2022 15:13    No results found.  CT CHEST LUNG CA SCREEN LOW DOSE W/O CM  Result Date: 09/04/2022 CLINICAL DATA:  57 year old male current smoker with 42.5 pack-year smoking history. EXAM: CT CHEST WITHOUT CONTRAST LOW-DOSE FOR LUNG CANCER SCREENING TECHNIQUE: Multidetector CT imaging of the chest was performed following the standard protocol without IV contrast. RADIATION DOSE REDUCTION: This exam was performed according to the departmental dose-optimization program which includes automated exposure control, adjustment of the mA and/or kV according to patient size and/or use of iterative reconstruction technique. COMPARISON:  08/29/2021 screening chest CT. FINDINGS: Cardiovascular: Normal heart size. No significant pericardial effusion/thickening. Left anterior descending and left  circumflex coronary atherosclerosis. Atherosclerotic nonaneurysmal thoracic aorta. Normal caliber pulmonary arteries. Mediastinum/Nodes: No significant thyroid nodules. Unremarkable esophagus. No pathologically enlarged axillary, mediastinal or hilar lymph nodes, noting limited sensitivity for the detection of hilar adenopathy on this noncontrast study. Lungs/Pleura: No pneumothorax. No pleural effusion. Mild centrilobular emphysema with diffuse bronchial wall thickening. No acute consolidative airspace disease or lung masses. No significant growth of previously visualized pulmonary nodules. New indistinct irregular solid superior segment right lower lobe pulmonary nodule measuring 12.5 mm in volume derived mean diameter (series 4/image 129). No additional new significant pulmonary nodules. Upper abdomen: Cholecystectomy. Musculoskeletal: No aggressive appearing focal osseous lesions. Moderate thoracic spondylosis. Partially visualized fixation hardware in the right clavicle shaft. IMPRESSION: 1. Lung-RADS 4B, suspicious. Additional imaging evaluation or consultation with Pulmonology or Thoracic Surgery recommended. New indistinct irregular solid superior segment right lower lobe pulmonary nodule measuring 12.5 mm in volume derived mean diameter, suspicious for primary bronchogenic malignancy. PET-CT suggested at this time for further characterization. 2. Two-vessel coronary atherosclerosis. 3. Aortic Atherosclerosis (ICD10-I70.0) and Emphysema (ICD10-J43.9). These results will be called to the ordering clinician or representative by the Radiologist Assistant, and communication documented in the PACS or [employer]. Electronically Signed   By: Delbert Phenix M.D.   On: 09/04/2022 15:13   CT ABDOMEN PELVIS W CONTRAST  Result Date: 08/23/2022 CLINICAL DATA:  Right lower quadrant pain and right flank pain. Stone suspected. EXAM: CT ABDOMEN AND PELVIS WITH CONTRAST TECHNIQUE: Multidetector CT imaging of the  abdomen and pelvis was performed using the standard protocol following bolus administration of intravenous contrast. RADIATION DOSE REDUCTION: This exam was performed according to the departmental dose-optimization program which includes automated exposure control, adjustment of the mA and/or kV according to patient size and/or use of iterative reconstruction technique. CONTRAST:  OMNIPAQUE IOHEXOL 300 MG/ML  SOLN COMPARISON:  CT with contrast 12/13/2018 FINDINGS: Lower chest: Lung bases are clear of infiltrates with mild centrilobular emphysematous changes and mild chronic elevation of the right diaphragm. There is a stable 3 mm chronic rounded nodule in the left lower lobe on 4:16. Small hiatal hernia. The cardiac size is normal. Hepatobiliary: Liver measures 19 cm in length, mildly steatotic without mass. The  gallbladder is absent without biliary dilatation. Pancreas: No abnormality. Spleen: Slightly prominent, 14 cm length. No mass enhancement. Unchanged. Adrenals/Urinary Tract: 3.2 cm simple cyst in the anterior upper left kidney, Hounsfield density of 8.6. Previously 2.5 cm. No follow-up imaging is recommended. There is a scar-like cortical defect in the upper pole of the right kidney. No renal masses are seen. There is symmetric excretion in the delayed phase. There is no urinary stone or obstruction. No stones were noted in 2020 either. The bladder wall and lumen are unremarkable. Stomach/Bowel: Small hiatal hernia. No dilatation or wall thickening including the appendix. Mild-to-moderate fecal stasis. There's scattered colonic diverticulosis without evidence of acute diverticulitis. Vascular/Lymphatic: Aortic atherosclerosis. No enlarged abdominal or pelvic lymph nodes. Reproductive: No prostatomegaly. Both testicles are in the scrotal sac. Other: There are small inguinal fat hernias. There is no incarcerated hernia. There is no free fluid, free hemorrhage or free air. Musculoskeletal: There are  degenerative changes of the thoracic and lumbar spine. No acute or significant osseous findings. Acquired spinal stenosis again noted L4-5 with right paracentral disc protrusion IMPRESSION: 1. No acute findings in the abdomen or pelvis. 2. Constipation and diverticulosis. 3. Small hiatal hernia. 4. Aortic atherosclerosis. 5. Mild hepatic steatosis. 6. 3.2 cm simple cyst upper pole left kidney. 7. Chronic 3 mm left lower lobe nodule and mild centrilobular emphysema lung bases. 8. Acquired spinal stenosis L4-5 with right paracentral disc protrusion. Aortic Atherosclerosis (ICD10-I70.0) and Emphysema (ICD10-J43.9). Electronically Signed   By: Almira Bar M.D.   On: 08/23/2022 20:46    Assessment and Plan: Patient Active Problem List   Diagnosis Date Noted   Aortic atherosclerosis (HCC) 12/07/2021   Abnormal LFTs 04/22/2021   Insomnia due to medical condition 12/13/2020   At risk for prolonged QT interval syndrome 12/13/2020   Hypersomnia with sleep apnea 12/13/2020   Mixed hyperlipidemia 09/26/2020   Pulmonary emphysema (HCC) 09/26/2020   Barrett's esophagus without dysplasia    Gastric erythema    Special screening for malignant neoplasms, colon    Polyp of colon    MDD (major depressive disorder), recurrent episode, mild (HCC) 06/23/2020   Noncompliance with diabetes treatment 04/12/2020   Angina pectoris (HCC) 04/12/2020   MDD (major depressive disorder), recurrent, in full remission (HCC) 01/05/2020   MDD (major depressive disorder), recurrent, in partial remission (HCC) 10/30/2019   Tobacco use disorder 07/02/2019   Right upper quadrant pain    Encounter for other preprocedural examination 01/20/2019   PTSD (post-traumatic stress disorder) 01/20/2019   MDD (major depressive disorder), recurrent episode, moderate (HCC) 01/20/2019   Insomnia due to mental disorder 01/20/2019   Calculus of gallbladder with acute on chronic cholecystitis without obstruction 11/15/2018   Generalized  abdominal pain 10/08/2018   Local superficial swelling, mass or lump 10/08/2018   Enterocolitis 08/16/2018   Left upper quadrant abdominal pain of unknown etiology 08/16/2018   Diastasis recti 06/19/2018   Anxiety disorder 04/01/2018   Clotted blood in stool 12/31/2017   Diarrhea 12/31/2017   Encounter for general adult medical examination with abnormal findings 10/27/2017   Uncontrolled type 2 diabetes mellitus with hyperglycemia (HCC) 10/27/2017   Onychomycosis with ingrown toenail 10/27/2017   Dysuria 10/27/2017   Uncontrolled type 2 diabetes mellitus with hypoglycemia without coma (HCC) 07/08/2017   Shortness of breath 07/08/2017   Cigarette nicotine dependence without complication 07/08/2017   Influenza A 05/30/2017   OSA (obstructive sleep apnea) 08/12/2015   Cervical spondylosis 06/14/2015   Essential hypertension 04/08/2015   Depression 04/08/2015  Gastroesophageal reflux disease without esophagitis 01/05/2015   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 Obstructive chronic bronchitis without exacerbation Pulmonary function will be ordered in case we need to have evaluation from surgery - Pulmonary function test; Future  2. Obesity, morbid (HCC) We will get him to work on weight loss through diet and exercise  3. Right lower lobe lung mass PET scan will be ordered due to the abnormality found on the CT scan - NM PET Image Initial (PI) Skull Base To Thigh (F-18 FDG); Future    General Counseling: I have discussed the findings of the evaluation and examination with Jonovan.  I have also discussed any further diagnostic evaluation thatmay be needed or ordered today. Maeson verbalizes understanding of the findings of todays visit. We also reviewed his medications today and discussed drug interactions and side effects including but not limited excessive drowsiness and altered mental states. We also discussed that there is always a risk not just to him but  also people around him. he has been encouraged to call the office with any questions or concerns that should arise related to todays visit.  Orders Placed This Encounter  Procedures   NM PET Image Initial (PI) Skull Base To Thigh (F-18 FDG)    Standing Status:   Future    Standing Expiration Date:   09/12/2023    Order Specific Question:   If indicated for the ordered procedure, I authorize the administration of a radiopharmaceutical per Radiology protocol    Answer:   Yes    Order Specific Question:   Preferred imaging location?    Answer:   [facility]   Pulmonary function test    Standing Status:   Future    Standing Expiration Date:   09/12/2023    Order Specific Question:   Where should this test be performed?    Answer:   [facility]     Time spent: 34  I have personally obtained a history, examined the patient, evaluated laboratory and imaging results, formulated the assessment and plan and placed orders.    Yevonne Pax, MD [facility] Sleep medicine

## 2022-09-13 ENCOUNTER — Telehealth: Payer: Self-pay | Admitting: Internal Medicine

## 2022-09-13 NOTE — Telephone Encounter (Signed)
Notified patient of PET appointment date, arrival time, location. Instructed patient nothing to eat 6 hours prior and no diabetic meds until after exam-Toni

## 2022-09-19 ENCOUNTER — Ambulatory Visit: Payer: 59 | Admitting: Internal Medicine

## 2022-09-20 ENCOUNTER — Other Ambulatory Visit: Payer: Self-pay | Admitting: Psychiatry

## 2022-09-20 ENCOUNTER — Ambulatory Visit
Admission: RE | Admit: 2022-09-20 | Discharge: 2022-09-20 | Disposition: A | Discharge status: Home / Self Care | Payer: 59 | Source: Ambulatory Visit | Attending: Internal Medicine | Admitting: Internal Medicine

## 2022-09-20 DIAGNOSIS — R918 Other nonspecific abnormal finding of lung field: Secondary | ICD-10-CM | POA: Diagnosis not present

## 2022-09-20 DIAGNOSIS — R911 Solitary pulmonary nodule: Secondary | ICD-10-CM | POA: Diagnosis not present

## 2022-09-20 DIAGNOSIS — F4310 Post-traumatic stress disorder, unspecified: Secondary | ICD-10-CM

## 2022-09-20 LAB — GLUCOSE, CAPILLARY: Glucose-Capillary: 96 mg/dL (ref 70–99)

## 2022-09-20 MED ORDER — FLUDEOXYGLUCOSE F - 18 (FDG) INJECTION
12.0000 | Freq: Once | INTRAVENOUS | Status: AC | PRN
  Administered 2022-09-20: 12.76 via INTRAVENOUS

## 2022-09-21 ENCOUNTER — Other Ambulatory Visit: Payer: 59 | Admitting: Urology

## 2022-09-25 ENCOUNTER — Other Ambulatory Visit: Payer: Self-pay | Admitting: Physician Assistant

## 2022-09-25 DIAGNOSIS — R918 Other nonspecific abnormal finding of lung field: Secondary | ICD-10-CM

## 2022-09-25 DIAGNOSIS — R9389 Abnormal findings on diagnostic imaging of other specified body structures: Secondary | ICD-10-CM

## 2022-10-01 ENCOUNTER — Other Ambulatory Visit: Payer: Self-pay | Admitting: Physician Assistant

## 2022-10-01 DIAGNOSIS — R109 Unspecified abdominal pain: Secondary | ICD-10-CM

## 2022-10-01 DIAGNOSIS — R10A1 Flank pain, right side: Secondary | ICD-10-CM

## 2022-10-01 DIAGNOSIS — T148XXA Other injury of unspecified body region, initial encounter: Secondary | ICD-10-CM

## 2022-10-04 ENCOUNTER — Ambulatory Visit (INDEPENDENT_AMBULATORY_CARE_PROVIDER_SITE_OTHER): Payer: 59 | Admitting: Internal Medicine

## 2022-10-04 DIAGNOSIS — J4489 Other specified chronic obstructive pulmonary disease: Secondary | ICD-10-CM | POA: Diagnosis not present

## 2022-10-06 ENCOUNTER — Other Ambulatory Visit: Payer: Self-pay | Admitting: Psychiatry

## 2022-10-06 DIAGNOSIS — F3342 Major depressive disorder, recurrent, in full remission: Secondary | ICD-10-CM

## 2022-10-06 DIAGNOSIS — F4310 Post-traumatic stress disorder, unspecified: Secondary | ICD-10-CM

## 2022-10-12 ENCOUNTER — Encounter: Payer: Self-pay | Admitting: Thoracic Surgery (Cardiothoracic Vascular Surgery)

## 2022-10-12 ENCOUNTER — Other Ambulatory Visit: Payer: Self-pay | Admitting: *Deleted

## 2022-10-12 ENCOUNTER — Encounter: Payer: Self-pay | Admitting: *Deleted

## 2022-10-12 ENCOUNTER — Institutional Professional Consult (permissible substitution): Payer: 59 | Admitting: Thoracic Surgery (Cardiothoracic Vascular Surgery)

## 2022-10-12 VITALS — BP 132/80 | HR 78 | Resp 20

## 2022-10-12 DIAGNOSIS — R911 Solitary pulmonary nodule: Secondary | ICD-10-CM | POA: Diagnosis not present

## 2022-10-12 LAB — PULMONARY FUNCTION TEST

## 2022-10-12 NOTE — H&P (View-Only) (Signed)
PCP is Abernathy, Alyssa, NP Referring Provider is McDonough, Lauren K, PA*  Chief Complaint  Patient presents with   Lung Lesion    Surgical consult, PET Scan 09/20/22/ Chest CT 08/31/22/ PFT's 10/04/22    HPI: Raymond [name] is sent for consultation regarding a right lung nodule.  Raymond [name] is a 57-year-old man with a complicated past medical history including tobacco abuse, COPD, reflux, hypertension, hyperlipidemia, CAD, MI, depression, PTSD, sleep apnea, type 2 diabetes, and a newly discovered lung nodule.  He has a 40-pack-year history of smoking (1 PPD x 40 years).  He continues to smoke.  He had a low-dose CT for lung cancer screening in early May.  It showed a new 12.5 mm spiculated nodule in the superior segment of the right lower lobe.  That led to a PET/CT which showed the nodule was hypermetabolic.  There was Raymond evidence of regional or distant metastatic disease.  He has numerous complaints including shortness of breath with exertion and claudication, both of which are limiting, but he can walk Raymond a flight of stairs.  Raymond chest pain, pressure, or tightness.  He has has had a loss of appetite and decreased energy.  Suffers from anxiety, depression, and PTSD.  Disabled from work.  Raymond Score: At the time of surgery this patient's most appropriate [name] status/level should be described as: []    0    Normal [name], Raymond symptoms []    1    Restricted in physical strenuous [name] but [name], Raymond to do out light work [x]    2    [name] and capable of self [name], Raymond [name], Raymond [name]                              []    3    Only limited self [name], in bed greater than 50% of waking [name] []    4    Completely disabled, Raymond self [name], confined to bed or chair []    5    Moribund  Past Medical History:  Diagnosis Date   Anxiety    Arthritis    NECK   Bronchitis    Coronary artery disease    Depression    Diabetes mellitus  without complication (HCC)    Diastasis of rectus abdominis 06/19/2018   Dyspnea    DUE TO GALLBLADDER PER PT   Emphysema lung (HCC)    Family history of adverse reaction to anesthesia    N/V, TROUBLE BREATHING COMING OUT OF ANESTHESIA   Fatty liver    GERD (gastroesophageal reflux disease)    Headache    MIGRAINES   Hyperlipidemia    Hypertension    Irregular heart beat unk   Myocardial infarction (HCC) 2010   MILD    PTSD (post-traumatic stress disorder)    Sleep apnea    USES CPAP   Tachycardia     Past Surgical History:  Procedure Laterality Date    ingrown toenail removal     CHOLECYSTECTOMY N/A 01/23/2019   Procedure: LAPAROSCOPIC CHOLECYSTECTOMY;  Surgeon: Piscoya, Jose, MD;  Location: [facility] ORS;  Service: General;  Laterality: N/A;   CLAVICLE SURGERY Right    COLONOSCOPY  2017   COLONOSCOPY WITH PROPOFOL N/A 08/25/2020   Procedure: COLONOSCOPY WITH PROPOFOL;  Surgeon: Tahiliani, Varnita B, MD;  Location: [facility] ENDOSCOPY;  Service: Endoscopy;  Laterality: N/A;     ESOPHAGOGASTRODUODENOSCOPY (EGD) WITH PROPOFOL N/A 08/25/2020   Procedure: ESOPHAGOGASTRODUODENOSCOPY (EGD) WITH PROPOFOL;  Surgeon: Tahiliani, Varnita B, MD;  Location: [facility] ENDOSCOPY;  Service: Endoscopy;  Laterality: N/A;   MOUTH SURGERY     REMOVED ALL TEETH   SHOULDER SURGERY Right    UPPER GI ENDOSCOPY  2017    Family History  Problem Relation Age of Onset   Hypertension Mother    Diabetes type II Mother    Diabetes Mother    COPD Father    Cancer Father    Heart disease Father    Diabetes Sister    Anxiety disorder Sister    Depression Sister    Diabetes Brother    Anxiety disorder Brother    Depression Brother    Diabetes Maternal Aunt    Diabetes Sister    Anxiety disorder Sister    Depression Sister    Diabetes Sister    Anxiety disorder Sister    Depression Sister    Diabetes Sister    Anxiety disorder Sister    Depression Sister    Colon cancer Maternal Uncle     Social  History Social History   Tobacco Use   Smoking status: Every Day    Packs/day: 1.00    Years: 40.00    Additional pack years: 0.00    Total pack years: 40.00    Types: Cigarettes   Smokeless tobacco: Former    Types: Chew   Tobacco comments:    1 pack per day reported 01/03/2021  Vaping Use   Vaping Use: Former  Substance Use Topics   Alcohol use: Raymond    Alcohol/week: 0.0 standard drinks of alcohol   Drug use: Raymond    Current Outpatient Medications  Medication Sig Dispense Refill   ACCU-CHEK FASTCLIX LANCETS MISC yse as directed twice a day 100 each 1   albuterol (VENTOLIN HFA) 108 (90 Base) MCG/ACT inhaler INHALE 2 PUFFS INTO THE LUNGS EVERY 6 [name] AS MEEDED FOR WHEEZING OR SHORTNESS OF BREATH 18 g 1   Albuterol Sulfate (PROAIR RESPICLICK) 108 (90 Base) MCG/ACT AEPB Inhale 2 puff by po every  hrs as needed for wheezing for SOB 1 each 5   aspirin EC 81 MG tablet Take 81 mg by mouth daily at 3 pm.      atorvastatin (LIPITOR) 40 MG tablet Take 1 tablet by mouth once daily 90 tablet 0   BREZTRI AEROSPHERE 160-9-4.8 MCG/ACT AERO INHALE 2 PUFFS TWICE DAILY 11 g 0   buPROPion (WELLBUTRIN XL) 150 MG 24 hr tablet Take 1 tablet (150 mg total) by mouth daily. Take along with 300 mg daily 90 tablet 1   buPROPion (WELLBUTRIN XL) 300 MG 24 hr tablet Take 1 tablet (300 mg total) by mouth daily. Take along with 150 mg daily 90 tablet 1   Continuous Blood Gluc Sensor (FREESTYLE LIBRE 3 SENSOR) MISC Apply 1 sensor to the skin every 14 days 6 each 3   cyclobenzaprine (FLEXERIL) 10 MG tablet TAKE 1 TABLET BY MOUTH AT BEDTIME AS NEEDED FOR  BACK  SPASM 30 tablet 0   dapagliflozin propanediol (FARXIGA) 10 MG TABS tablet Take 1 tablet (10 mg total) by mouth daily before breakfast. 90 tablet 2   diclofenac (VOLTAREN) 75 MG EC tablet Take 75 mg by mouth 2 (two) times daily.     DULoxetine (CYMBALTA) 30 MG capsule Take 1 capsule (30 mg total) by mouth daily. Take along with 60 mg daily, total of 90 mg daily  90   capsule 1   DULoxetine (CYMBALTA) 60 MG capsule TAKE 1 CAPSULE BY MOUTH ONCE DAILY (TAKE  ALONG  WITH  30MG  DAILY) 90 capsule 0   ezetimibe (ZETIA) 10 MG tablet Take 1 tablet (10 mg total) by mouth daily. 90 tablet 3   famotidine (PEPCID) 20 MG tablet Take 1 tablet (20 mg total) by mouth 2 (two) times daily. 180 tablet 1   gabapentin (NEURONTIN) 600 MG tablet Take 0.5 tablets (300 mg total) by mouth 2 (two) times daily. May increase to 1 whole tablet in PM after 1 week. 60 tablet 3   glucose blood (ACCU-CHEK AVIVA PLUS) test strip Blood sugar testing TID and as needed. E11.65 100 each 12   hydrOXYzine (VISTARIL) 25 MG capsule Take 1-2 capsules (25-50 mg total) by mouth at bedtime as needed. 60 capsule 3   insulin glargine, 2 Unit Dial, (TOUJEO MAX SOLOSTAR) 300 UNIT/ML Solostar Pen Inject 70 Units into the skin daily. May decrease to 40 units at bedtime if having episodes of hypoglycemia. 18 mL 3   Insulin Pen Needle 32G X 4 MM MISC Use with daily dose insulin and with sliding scale insulin as needed. 100 each 5   MELATONIN PO Take 10 mg by mouth daily.     metoprolol tartrate (LOPRESSOR) 100 MG tablet Take 1 tablet (100 mg total) by mouth 2 (two) times daily. 180 tablet 1   nitroGLYCERIN (NITROSTAT) 0.4 MG SL tablet Place 1 tablet (0.4 mg total) under the tongue every 5 (five) minutes x 3 doses as needed for chest pain. 30 tablet 1   pantoprazole (PROTONIX) 40 MG tablet Take 1 tablet (40 mg total) by mouth daily. 90 tablet 1   tamsulosin (FLOMAX) 0.4 MG CAPS capsule Take 1 capsule (0.4 mg total) by mouth daily. 30 capsule 3   temazepam (RESTORIL) 30 MG capsule TAKE 1 CAPSULE BY MOUTH AT BEDTIME AS NEEDED FOR SLEEP 30 capsule 1   tirzepatide (MOUNJARO) 7.5 MG/0.5ML Pen Inject 7.5 mg into the skin once a week. 2 mL 5   Vibegron (GEMTESA) 75 MG TABS Take 1 tablet (75 mg total) by mouth daily. 30 tablet 0   Continuous Blood Gluc Sensor (FREESTYLE LIBRE 2 SENSOR) MISC USE 1 SENSOR EVERY 14 DAYS 9  each 1   Raymond current facility-administered medications for this visit.    Allergies  Allergen Reactions   Onion Anaphylaxis   Hydrocodone    Nicoderm [Nicotine]    Norco [Hydrocodone-Acetaminophen] Itching   Hydrocodone-Acetaminophen Itching   Nickel Rash    Review of Systems  Constitutional:  Positive for [name] change, appetite change, fatigue and unexpected weight change (Lost about 8 pounds in 3 months).  HENT:  Positive for dental problem (Dentures). Negative for trouble swallowing and voice change.   Eyes:  Negative for visual disturbance.  Respiratory:  Positive for apnea, cough and shortness of breath.   Cardiovascular:  Negative for chest pain and leg swelling.  Gastrointestinal:  Positive for abdominal pain (Reflux) and constipation.  Genitourinary:  Positive for frequency. Negative for dysuria.  Musculoskeletal:  Positive for arthralgias, gait problem and joint swelling.       Pain in calves and thighs with ambulation  Neurological:  Positive for dizziness and numbness (Hands and feet). Negative for seizures.  Hematological:  Negative for adenopathy. Does not bruise/bleed easily.  Psychiatric/Behavioral:  Positive for dysphoric mood. The patient is nervous/anxious.   All other systems reviewed and are negative.   BP 132/80   Pulse 78     Resp 20   SpO2 97% Comment: RA Physical Exam Vitals reviewed.  Constitutional:      General: He is not in acute distress.    Appearance: Normal appearance.  HENT:     Head: Normocephalic and atraumatic.  Eyes:     General: Raymond scleral icterus.    Extraocular Movements: Extraocular movements intact.  Neck:     Vascular: Raymond carotid bruit.  Cardiovascular:     Rate and Rhythm: Normal rate and regular rhythm.     Heart sounds: Normal heart sounds. Raymond murmur heard.    Raymond friction rub. Raymond gallop.  Pulmonary:     Effort: Pulmonary effort is normal. Raymond respiratory distress.     Breath sounds: Normal breath sounds. Raymond wheezing or  rales.  Abdominal:     General: There is Raymond distension.     Palpations: Abdomen is soft.  Lymphadenopathy:     Cervical: Raymond cervical adenopathy.  Skin:    General: Skin is warm and dry.  Neurological:     General: Raymond focal deficit present.     Mental Status: He is alert and oriented to person, place, and time.     Cranial Nerves: Raymond cranial nerve deficit.     Motor: Raymond weakness.     Diagnostic Tests: CT CHEST WITHOUT CONTRAST LOW-DOSE FOR LUNG CANCER SCREENING   TECHNIQUE: Multidetector CT imaging of the chest was performed following the standard protocol without IV contrast.   RADIATION DOSE REDUCTION: This exam was performed according to the departmental dose-optimization program which includes automated exposure control, adjustment of the mA and/or kV according to patient size and/or use of iterative reconstruction technique.   COMPARISON:  08/29/2021 screening chest CT.   FINDINGS: Cardiovascular: Normal heart size. Raymond significant pericardial effusion/thickening. Left anterior descending and left circumflex coronary atherosclerosis. Atherosclerotic nonaneurysmal thoracic aorta. Normal caliber pulmonary arteries.   Mediastinum/Nodes: Raymond significant thyroid nodules. Unremarkable esophagus. Raymond pathologically enlarged axillary, mediastinal or hilar lymph nodes, noting limited sensitivity for the detection of hilar adenopathy on this noncontrast study.   Lungs/Pleura: Raymond pneumothorax. Raymond pleural effusion. Mild centrilobular emphysema with diffuse bronchial wall thickening. Raymond acute consolidative airspace disease or lung masses. Raymond significant growth of previously visualized pulmonary nodules. New indistinct irregular solid superior segment right lower lobe pulmonary nodule measuring 12.5 mm in volume derived mean diameter (series 4/image 129). Raymond additional new significant pulmonary nodules.   Upper abdomen: Cholecystectomy.   Musculoskeletal: Raymond aggressive appearing  focal osseous lesions. Moderate thoracic spondylosis. Partially visualized fixation hardware in the right clavicle shaft.   IMPRESSION: 1. Lung-RADS 4B, suspicious. Additional imaging evaluation or consultation with Pulmonology or Thoracic Surgery recommended. New indistinct irregular solid superior segment right lower lobe pulmonary nodule measuring 12.5 mm in volume derived mean diameter, suspicious for primary bronchogenic malignancy. PET-CT suggested at this time for further characterization. 2. Two-vessel coronary atherosclerosis. 3. Aortic Atherosclerosis (ICD10-I70.0) and Emphysema (ICD10-J43.9).   These results will be called to the ordering clinician or representative by the Radiologist Assistant, and communication documented in the PACS or [facility] Dashboard.     Electronically Signed   By: Jason A Poff M.D.   On: 09/04/2022 15:13 NUCLEAR MEDICINE PET SKULL BASE TO THIGH   TECHNIQUE: 12.8 mCi F-18 FDG was injected intravenously. Full-ring PET imaging was performed from the skull base to thigh after the radiotracer. CT data was obtained and used for attenuation correction and anatomic localization.   Fasting blood glucose: 96 mg/dl   COMPARISON:  CT chest dated   08/31/2022. CT abdomen/pelvis dated 08/23/2022.   FINDINGS: Mediastinal blood pool [name]: SUV max 2.5   Liver [name]: SUV max NA   NECK: Raymond hypermetabolic cervical lymphadenopathy.   Incidental CT findings: None.   CHEST: 15 mm bilobed nodule in the superior segment right lower lobe (series 4/image 52), max SUV 4.0.   Raymond hypermetabolic thoracic lymphadenopathy.   Incidental CT findings: Mild atherosclerotic calcifications of the aortic arch. Mild coronary atherosclerosis of the LAD.   ABDOMEN/PELVIS: Raymond abnormal hypermetabolism in the liver, spleen, pancreas, or adrenal glands.   Raymond hypermetabolic abdominopelvic lymphadenopathy.   Incidental CT findings: Status post cholecystectomy.  Atherosclerotic calcifications of the abdominal aorta and branch vessels. 3.1 cm left upper pole renal cyst, benign (Bosniak I).   SKELETON: Raymond focal hypermetabolic [name] to suggest skeletal metastasis.   Mild hypermetabolism along the right aspect of the T7 vertebral body, max SUV 5.0, degenerative.   Incidental CT findings: Degenerative changes of the visualized thoracolumbar spine. Status post ORIF of the right clavicle.   IMPRESSION: 15 mm bilobed nodule in the superior segment right lower lobe demonstrates mild hypermetabolism. While infectious/inflammatory etiologies are technically possible, this appearance is considered suspicious for primary bronchogenic carcinoma such as semi-invasive adenocarcinoma.   Raymond evidence of metastatic disease.     Electronically Signed   By: Sriyesh  Krishnan M.D.   On: 09/25/2022 01:43 I personally reviewed the CT and PET/CT images.  There is a spiculated bilobed nodule in the superior segment of the right lower lobe that is hypermetabolic on PET/CT.  Raymond evidence of regional or distant metastatic disease.  Emphysema.  Aortic and coronary atherosclerosis.  Impression: Raymond [name] is a 57-year-old man with a complicated past medical history including tobacco abuse, COPD, reflux, hypertension, hyperlipidemia, CAD, MI, depression, PTSD, sleep apnea, type 2 diabetes, and a newly discovered lung nodule.   Right lower lobe lung nodule-found on low-dose CT scan.  Spiculated and significant hypermetabolic [name] on PET/CT for the size of the lesion.  Has to be considered a new primary bronchogenic carcinoma unless it can be proven otherwise.  Infectious and inflammatory nodules are also in the differential.  Lung cancer is more likely given his clinical history and the appearance of the nodule.  I discussed 2 potential options with him.  The first would be to proceed to surgical resection for definitive diagnosis and treatment the same setting.   The second option would be to do a bronchoscopic biopsy and refer for stereotactic radiation.  We discussed the relative advantages and disadvantages of surgery and radiation for treatment of lung cancer.  Based on his clinical history he be a marginal candidate for lobectomy, but should be Raymond to tolerate a segmentectomy.  We do need his pulmonary function testing which was done in [place] a couple of days ago but I am not Raymond to access that via epic.  He is accompanied by his ex-wife.  I reviewed the images with them.  I informed them of the general nature of the proposed procedure including the need for general anesthesia, the incisions to be used, the use of the surgical robot, the use of drains to postoperatively, the expected hospital stay, and the overall recovery.  I informed them of the indications, risks, benefits, and alternatives.  They understand the risks include, but not limited to death, MI, DVT, PE, bleeding, possible need for transfusion, infection, prolonged air leak, cardiac arrhythmias, pain issues, as well as the possibility of other unforeseeable complications.  He is willing   to accept the risks and wishes to proceed.  Plan: Check pulmonary function testing results Tentatively plan robotic assisted right lower lobe superior appendectomy on 10/27/2022  April Colter C Lunette Tapp, MD [facility] [phone]  Addendum Pulmonary function test 10/04/2022 FVC 3.84 (84%) FEV1 3.06 (86%) FEV1 3.21 (90%) postbronchodilator DLCO 24.97 (92%)  Is adequate pulmonary reserve to tolerate a resection.  sch 

## 2022-10-12 NOTE — Progress Notes (Addendum)
PCP is Sallyanne Kuster, NP Referring Provider is Carlean Jews, PA*  Chief Complaint  Patient presents with   Lung Lesion    Surgical consult, PET Scan 09/20/22/ Chest CT 08/31/22/ PFT's 10/04/22    HPI: [name] is sent for consultation regarding a right lung nodule.  Raymond [name] is a 57 year old man with a complicated past medical history including tobacco abuse, COPD, reflux, hypertension, hyperlipidemia, CAD, MI, depression, PTSD, sleep apnea, type 2 diabetes, and a newly discovered lung nodule.  He has a 40-pack-year history of smoking (1 PPD x 40 years).  He continues to smoke.  He had a low-dose CT for lung cancer screening in early May.  It showed a new 12.5 mm spiculated nodule in the superior segment of the right lower lobe.  That led to a PET/CT which showed the nodule was hypermetabolic.  There was no evidence of regional or distant metastatic disease.  He has numerous complaints including shortness of breath with exertion and claudication, both of which are limiting, but he can walk up a flight of stairs.  No chest pain, pressure, or tightness.  He has has had a loss of appetite and decreased energy.  Suffers from anxiety, depression, and PTSD.  Disabled from work.  Zubrod Score: At the time of surgery this patient's most appropriate activity status/level should be described as: []     0    Normal activity, no symptoms []     1    Restricted in physical strenuous activity but ambulatory, able to do out light work [x]     2    Ambulatory and capable of self care, unable to do work activities, up and about >50 % of waking hours                              []     3    Only limited self care, in bed greater than 50% of waking hours []     4    Completely disabled, no self care, confined to bed or chair []     5    Moribund  Past Medical History:  Diagnosis Date   Anxiety    Arthritis    NECK   Bronchitis    Coronary artery disease    Depression    Diabetes mellitus  without complication (HCC)    Diastasis of rectus abdominis 06/19/2018   Dyspnea    DUE TO GALLBLADDER PER PT   Emphysema lung (HCC)    Family history of adverse reaction to anesthesia    N/V, TROUBLE BREATHING COMING OUT OF ANESTHESIA   Fatty liver    GERD (gastroesophageal reflux disease)    Headache    MIGRAINES   Hyperlipidemia    Hypertension    Irregular heart beat unk   Myocardial infarction (HCC) 2010   MILD    PTSD (post-traumatic stress disorder)    Sleep apnea    USES CPAP   Tachycardia     Past Surgical History:  Procedure Laterality Date    ingrown toenail removal     CHOLECYSTECTOMY N/A 01/23/2019   Procedure: LAPAROSCOPIC CHOLECYSTECTOMY;  Surgeon: Henrene Dodge, MD;  Location: [facility];  Service: General;  Laterality: N/A;   CLAVICLE SURGERY Right    COLONOSCOPY  2017   COLONOSCOPY WITH PROPOFOL N/A 08/25/2020   Procedure: COLONOSCOPY WITH PROPOFOL;  Surgeon: Pasty Spillers, MD;  Location: [facility];  Service: Endoscopy;  Laterality: N/A;  ESOPHAGOGASTRODUODENOSCOPY (EGD) WITH PROPOFOL N/A 08/25/2020   Procedure: ESOPHAGOGASTRODUODENOSCOPY (EGD) WITH PROPOFOL;  Surgeon: Pasty Spillers, MD;  Location: [facility];  Service: Endoscopy;  Laterality: N/A;   MOUTH SURGERY     REMOVED ALL TEETH   SHOULDER SURGERY Right    UPPER GI ENDOSCOPY  2017    Family History  Problem Relation Age of Onset   Hypertension Mother    Diabetes type II Mother    Diabetes Mother    COPD Father    Cancer Father    Heart disease Father    Diabetes Sister    Anxiety disorder Sister    Depression Sister    Diabetes Brother    Anxiety disorder Brother    Depression Brother    Diabetes Maternal Aunt    Diabetes Sister    Anxiety disorder Sister    Depression Sister    Diabetes Sister    Anxiety disorder Sister    Depression Sister    Diabetes Sister    Anxiety disorder Sister    Depression Sister    Colon cancer Maternal Uncle     Social  History Social History   Tobacco Use   Smoking status: Every Day    Packs/day: 1.00    Years: 40.00    Additional pack years: 0.00    Total pack years: 40.00    Types: Cigarettes   Smokeless tobacco: Former    Types: Chew   Tobacco comments:    1 pack per day reported 01/03/2021  Vaping Use   Vaping Use: Former  Substance Use Topics   Alcohol use: No    Alcohol/week: 0.0 standard drinks of alcohol   Drug use: No    Current Outpatient Medications  Medication Sig Dispense Refill   ACCU-CHEK FASTCLIX LANCETS MISC yse as directed twice a day 100 each 1   albuterol (VENTOLIN HFA) 108 (90 Base) MCG/ACT inhaler INHALE 2 PUFFS INTO THE LUNGS EVERY 6 HOURS AS MEEDED FOR WHEEZING OR SHORTNESS OF BREATH 18 g 1   Albuterol Sulfate (PROAIR RESPICLICK) 108 (90 Base) MCG/ACT AEPB Inhale 2 puff by po every  hrs as needed for wheezing for SOB 1 each 5   aspirin EC 81 MG tablet Take 81 mg by mouth daily at 3 pm.      atorvastatin (LIPITOR) 40 MG tablet Take 1 tablet by mouth once daily 90 tablet 0   BREZTRI AEROSPHERE 160-9-4.8 MCG/ACT AERO INHALE 2 PUFFS TWICE DAILY 11 g 0   buPROPion (WELLBUTRIN XL) 150 MG 24 hr tablet Take 1 tablet (150 mg total) by mouth daily. Take along with 300 mg daily 90 tablet 1   buPROPion (WELLBUTRIN XL) 300 MG 24 hr tablet Take 1 tablet (300 mg total) by mouth daily. Take along with 150 mg daily 90 tablet 1   Continuous Blood Gluc Sensor (FREESTYLE LIBRE 3 SENSOR) MISC Apply 1 sensor to the skin every 14 days 6 each 3   cyclobenzaprine (FLEXERIL) 10 MG tablet TAKE 1 TABLET BY MOUTH AT BEDTIME AS NEEDED FOR  BACK  SPASM 30 tablet 0   dapagliflozin propanediol (FARXIGA) 10 MG TABS tablet Take 1 tablet (10 mg total) by mouth daily before breakfast. 90 tablet 2   diclofenac (VOLTAREN) 75 MG EC tablet Take 75 mg by mouth 2 (two) times daily.     DULoxetine (CYMBALTA) 30 MG capsule Take 1 capsule (30 mg total) by mouth daily. Take along with 60 mg daily, total of 90 mg daily  90  capsule 1   DULoxetine (CYMBALTA) 60 MG capsule TAKE 1 CAPSULE BY MOUTH ONCE DAILY (TAKE  ALONG  WITH  30MG   DAILY) 90 capsule 0   ezetimibe (ZETIA) 10 MG tablet Take 1 tablet (10 mg total) by mouth daily. 90 tablet 3   famotidine (PEPCID) 20 MG tablet Take 1 tablet (20 mg total) by mouth 2 (two) times daily. 180 tablet 1   gabapentin (NEURONTIN) 600 MG tablet Take 0.5 tablets (300 mg total) by mouth 2 (two) times daily. May increase to 1 whole tablet in PM after 1 week. 60 tablet 3   glucose blood (ACCU-CHEK AVIVA PLUS) test strip Blood sugar testing TID and as needed. E11.65 100 each 12   hydrOXYzine (VISTARIL) 25 MG capsule Take 1-2 capsules (25-50 mg total) by mouth at bedtime as needed. 60 capsule 3   insulin glargine, 2 Unit Dial, (TOUJEO MAX SOLOSTAR) 300 UNIT/ML Solostar Pen Inject 70 Units into the skin daily. May decrease to 40 units at bedtime if having episodes of hypoglycemia. 18 mL 3   Insulin Pen Needle 32G X 4 MM MISC Use with daily dose insulin and with sliding scale insulin as needed. 100 each 5   MELATONIN PO Take 10 mg by mouth daily.     metoprolol tartrate (LOPRESSOR) 100 MG tablet Take 1 tablet (100 mg total) by mouth 2 (two) times daily. 180 tablet 1   nitroGLYCERIN (NITROSTAT) 0.4 MG SL tablet Place 1 tablet (0.4 mg total) under the tongue every 5 (five) minutes x 3 doses as needed for chest pain. 30 tablet 1   pantoprazole (PROTONIX) 40 MG tablet Take 1 tablet (40 mg total) by mouth daily. 90 tablet 1   tamsulosin (FLOMAX) 0.4 MG CAPS capsule Take 1 capsule (0.4 mg total) by mouth daily. 30 capsule 3   temazepam (RESTORIL) 30 MG capsule TAKE 1 CAPSULE BY MOUTH AT BEDTIME AS NEEDED FOR SLEEP 30 capsule 1   tirzepatide (MOUNJARO) 7.5 MG/0.5ML Pen Inject 7.5 mg into the skin once a week. 2 mL 5   Vibegron (GEMTESA) 75 MG TABS Take 1 tablet (75 mg total) by mouth daily. 30 tablet 0   Continuous Blood Gluc Sensor (FREESTYLE LIBRE 2 SENSOR) MISC USE 1 SENSOR EVERY 14 DAYS 9  each 1   No current facility-administered medications for this visit.    Allergies  Allergen Reactions   Onion Anaphylaxis   Hydrocodone    Nicoderm [Nicotine]    Norco [Hydrocodone-Acetaminophen] Itching   Hydrocodone-Acetaminophen Itching   Nickel Rash    Review of Systems  Constitutional:  Positive for activity change, appetite change, fatigue and unexpected weight change (Lost about 8 pounds in 3 months).  HENT:  Positive for dental problem (Dentures). Negative for trouble swallowing and voice change.   Eyes:  Negative for visual disturbance.  Respiratory:  Positive for apnea, cough and shortness of breath.   Cardiovascular:  Negative for chest pain and leg swelling.  Gastrointestinal:  Positive for abdominal pain (Reflux) and constipation.  Genitourinary:  Positive for frequency. Negative for dysuria.  Musculoskeletal:  Positive for arthralgias, gait problem and joint swelling.       Pain in calves and thighs with ambulation  Neurological:  Positive for dizziness and numbness (Hands and feet). Negative for seizures.  Hematological:  Negative for adenopathy. Does not bruise/bleed easily.  Psychiatric/Behavioral:  Positive for dysphoric mood. The patient is nervous/anxious.   All other systems reviewed and are negative.   BP 132/80   Pulse 78  Resp 20   SpO2 97% Comment: RA Physical Exam Vitals reviewed.  Constitutional:      General: He is not in acute distress.    Appearance: Normal appearance.  HENT:     Head: Normocephalic and atraumatic.  Eyes:     General: No scleral icterus.    Extraocular Movements: Extraocular movements intact.  Neck:     Vascular: No carotid bruit.  Cardiovascular:     Rate and Rhythm: Normal rate and regular rhythm.     Heart sounds: Normal heart sounds. No murmur heard.    No friction rub. No gallop.  Pulmonary:     Effort: Pulmonary effort is normal. No respiratory distress.     Breath sounds: Normal breath sounds. No wheezing or  rales.  Abdominal:     General: There is no distension.     Palpations: Abdomen is soft.  Lymphadenopathy:     Cervical: No cervical adenopathy.  Skin:    General: Skin is warm and dry.  Neurological:     General: No focal deficit present.     Mental Status: He is alert and oriented to person, place, and time.     Cranial Nerves: No cranial nerve deficit.     Motor: No weakness.     Diagnostic Tests: CT CHEST WITHOUT CONTRAST LOW-DOSE FOR LUNG CANCER SCREENING   TECHNIQUE: Multidetector CT imaging of the chest was performed following the standard protocol without IV contrast.   RADIATION DOSE REDUCTION: This exam was performed according to the departmental dose-optimization program which includes automated exposure control, adjustment of the mA and/or kV according to patient size and/or use of iterative reconstruction technique.   COMPARISON:  08/29/2021 screening chest CT.   FINDINGS: Cardiovascular: Normal heart size. No significant pericardial effusion/thickening. Left anterior descending and left circumflex coronary atherosclerosis. Atherosclerotic nonaneurysmal thoracic aorta. Normal caliber pulmonary arteries.   Mediastinum/Nodes: No significant thyroid nodules. Unremarkable esophagus. No pathologically enlarged axillary, mediastinal or hilar lymph nodes, noting limited sensitivity for the detection of hilar adenopathy on this noncontrast study.   Lungs/Pleura: No pneumothorax. No pleural effusion. Mild centrilobular emphysema with diffuse bronchial wall thickening. No acute consolidative airspace disease or lung masses. No significant growth of previously visualized pulmonary nodules. New indistinct irregular solid superior segment right lower lobe pulmonary nodule measuring 12.5 mm in volume derived mean diameter (series 4/image 129). No additional new significant pulmonary nodules.   Upper abdomen: Cholecystectomy.   Musculoskeletal: No aggressive appearing  focal osseous lesions. Moderate thoracic spondylosis. Partially visualized fixation hardware in the right clavicle shaft.   IMPRESSION: 1. Lung-RADS 4B, suspicious. Additional imaging evaluation or consultation with Pulmonology or Thoracic Surgery recommended. New indistinct irregular solid superior segment right lower lobe pulmonary nodule measuring 12.5 mm in volume derived mean diameter, suspicious for primary bronchogenic malignancy. PET-CT suggested at this time for further characterization. 2. Two-vessel coronary atherosclerosis. 3. Aortic Atherosclerosis (ICD10-I70.0) and Emphysema (ICD10-J43.9).   These results will be called to the ordering clinician or representative by the Radiologist Assistant, and communication documented in the PACS or [employer].     Electronically Signed   By: Delbert Phenix M.D.   On: 09/04/2022 15:13 NUCLEAR MEDICINE PET SKULL BASE TO THIGH   TECHNIQUE: 12.8 mCi F-18 FDG was injected intravenously. Full-ring PET imaging was performed from the skull base to thigh after the radiotracer. CT data was obtained and used for attenuation correction and anatomic localization.   Fasting blood glucose: 96 mg/dl   COMPARISON:  CT chest dated  08/31/2022. CT abdomen/pelvis dated 08/23/2022.   FINDINGS: Mediastinal blood pool activity: SUV max 2.5   Liver activity: SUV max NA   NECK: No hypermetabolic cervical lymphadenopathy.   Incidental CT findings: None.   CHEST: 15 mm bilobed nodule in the superior segment right lower lobe (series 4/image 52), max SUV 4.0.   No hypermetabolic thoracic lymphadenopathy.   Incidental CT findings: Mild atherosclerotic calcifications of the aortic arch. Mild coronary atherosclerosis of the LAD.   ABDOMEN/PELVIS: No abnormal hypermetabolism in the liver, spleen, pancreas, or adrenal glands.   No hypermetabolic abdominopelvic lymphadenopathy.   Incidental CT findings: Status post cholecystectomy.  Atherosclerotic calcifications of the abdominal aorta and branch vessels. 3.1 cm left upper pole renal cyst, benign (Bosniak I).   SKELETON: No focal hypermetabolic activity to suggest skeletal metastasis.   Mild hypermetabolism along the right aspect of the T7 vertebral body, max SUV 5.0, degenerative.   Incidental CT findings: Degenerative changes of the visualized thoracolumbar spine. Status post ORIF of the right clavicle.   IMPRESSION: 15 mm bilobed nodule in the superior segment right lower lobe demonstrates mild hypermetabolism. While infectious/inflammatory etiologies are technically possible, this appearance is considered suspicious for primary bronchogenic carcinoma such as semi-invasive adenocarcinoma.   No evidence of metastatic disease.     Electronically Signed   By: Charline Bills M.D.   On: 09/25/2022 01:43 I personally reviewed the CT and PET/CT images.  There is a spiculated bilobed nodule in the superior segment of the right lower lobe that is hypermetabolic on PET/CT.  No evidence of regional or distant metastatic disease.  Emphysema.  Aortic and coronary atherosclerosis.  Impression: Raymond [name] is a 57 year old man with a complicated past medical history including tobacco abuse, COPD, reflux, hypertension, hyperlipidemia, CAD, MI, depression, PTSD, sleep apnea, type 2 diabetes, and a newly discovered lung nodule.   Right lower lobe lung nodule-found on low-dose CT scan.  Spiculated and significant hypermetabolic activity on PET/CT for the size of the lesion.  Has to be considered a new primary bronchogenic carcinoma unless it can be proven otherwise.  Infectious and inflammatory nodules are also in the differential.  Lung cancer is more likely given his clinical history and the appearance of the nodule.  I discussed 2 potential options with him.  The first would be to proceed to surgical resection for definitive diagnosis and treatment the same setting.   The second option would be to do a bronchoscopic biopsy and refer for stereotactic radiation.  We discussed the relative advantages and disadvantages of surgery and radiation for treatment of lung cancer.  Based on his clinical history he be a marginal candidate for lobectomy, but should be able to tolerate a segmentectomy.  We do need his pulmonary function testing which was done in [place] a couple of days ago but I am not able to access that via epic.  He is accompanied by his ex-wife.  I reviewed the images with them.  I informed them of the general nature of the proposed procedure including the need for general anesthesia, the incisions to be used, the use of the surgical robot, the use of drains to postoperatively, the expected hospital stay, and the overall recovery.  I informed them of the indications, risks, benefits, and alternatives.  They understand the risks include, but not limited to death, MI, DVT, PE, bleeding, possible need for transfusion, infection, prolonged air leak, cardiac arrhythmias, pain issues, as well as the possibility of other unforeseeable complications.  He is willing  to accept the risks and wishes to proceed.  Plan: Check pulmonary function testing results Tentatively plan robotic assisted right lower lobe superior appendectomy on 10/27/2022  Loreli Slot, MD [facility] [phone]  Addendum Pulmonary function test 10/04/2022 FVC 3.84 (84%) FEV1 3.06 (86%) FEV1 3.21 (90%) postbronchodilator DLCO 24.97 (92%)  Is adequate pulmonary reserve to tolerate a resection.  sch

## 2022-10-13 ENCOUNTER — Other Ambulatory Visit: Payer: Self-pay | Admitting: Nurse Practitioner

## 2022-10-13 DIAGNOSIS — E1165 Type 2 diabetes mellitus with hyperglycemia: Secondary | ICD-10-CM

## 2022-10-16 ENCOUNTER — Other Ambulatory Visit: Payer: Self-pay | Admitting: Nurse Practitioner

## 2022-10-16 DIAGNOSIS — E1165 Type 2 diabetes mellitus with hyperglycemia: Secondary | ICD-10-CM

## 2022-10-16 MED ORDER — TOUJEO MAX SOLOSTAR 300 UNIT/ML ~~LOC~~ SOPN: 70.0000 [IU] | PEN_INJECTOR | Freq: Every day | SUBCUTANEOUS | 3 refills | Status: DC

## 2022-10-16 NOTE — Telephone Encounter (Signed)
Please review and send 

## 2022-10-17 ENCOUNTER — Telehealth: Payer: Medicare Other | Admitting: Psychiatry

## 2022-10-17 ENCOUNTER — Ambulatory Visit: Payer: 59 | Admitting: Internal Medicine

## 2022-10-19 NOTE — Procedures (Signed)
[facility] [address] [place], [address]    Complete Pulmonary Function Testing Interpretation:  FINDINGS:  Forced vital capacity is normal.  The FEV1 is normal.  FEV1 FVC ratio is mildly decreased.  Postbronchodilator no significant change in FEV1 is noted.  Total lung capacity is mildly decreased residual volume is decreased residual in the lung passive ratio is decreased.  FRC is normal.  DLCO is within normal limits  IMPRESSION:  This pulmonary function study is suggestive of mild restrictive lung disease clinical correlation is recommended  Yevonne Pax, MD [facility] Pulmonary Critical Care Medicine Sleep Medicine

## 2022-10-23 ENCOUNTER — Ambulatory Visit (INDEPENDENT_AMBULATORY_CARE_PROVIDER_SITE_OTHER): Payer: 59 | Admitting: Nurse Practitioner

## 2022-10-23 ENCOUNTER — Encounter: Payer: Self-pay | Admitting: Nurse Practitioner

## 2022-10-23 VITALS — BP 122/79 | HR 82 | Temp 97.8°F | Resp 16 | Ht 69.0 in | Wt 233.0 lb

## 2022-10-23 DIAGNOSIS — R300 Dysuria: Secondary | ICD-10-CM

## 2022-10-23 DIAGNOSIS — I700 Atherosclerosis of aorta: Secondary | ICD-10-CM

## 2022-10-23 DIAGNOSIS — F17210 Nicotine dependence, cigarettes, uncomplicated: Secondary | ICD-10-CM | POA: Diagnosis not present

## 2022-10-23 DIAGNOSIS — Z0001 Encounter for general adult medical examination with abnormal findings: Secondary | ICD-10-CM

## 2022-10-23 DIAGNOSIS — E1165 Type 2 diabetes mellitus with hyperglycemia: Secondary | ICD-10-CM

## 2022-10-23 DIAGNOSIS — Z79899 Other long term (current) drug therapy: Secondary | ICD-10-CM | POA: Diagnosis not present

## 2022-10-23 LAB — PULMONARY FUNCTION TEST

## 2022-10-23 MED ORDER — SEMAGLUTIDE 7 MG PO TABS
7.0000 mg | ORAL_TABLET | Freq: Every day | ORAL | 3 refills | Status: AC
Start: 2022-10-23 — End: ?

## 2022-10-23 MED ORDER — TAMSULOSIN HCL 0.4 MG PO CAPS: 0.4000 mg | ORAL_CAPSULE | Freq: Every day | ORAL | 3 refills | Status: DC

## 2022-10-23 MED ORDER — CYCLOBENZAPRINE HCL 10 MG PO TABS: 10.0000 mg | ORAL_TABLET | Freq: Every evening | ORAL | 5 refills | Status: DC | PRN

## 2022-10-23 MED ORDER — GABAPENTIN 600 MG PO TABS: 300.0000 mg | ORAL_TABLET | Freq: Two times a day (BID) | ORAL | 3 refills | Status: DC

## 2022-10-23 MED ORDER — EZETIMIBE 10 MG PO TABS: 10.0000 mg | ORAL_TABLET | Freq: Every day | ORAL | 3 refills | Status: DC

## 2022-10-23 MED ORDER — METOPROLOL TARTRATE 100 MG PO TABS: 100.0000 mg | ORAL_TABLET | Freq: Two times a day (BID) | ORAL | 1 refills | Status: DC

## 2022-10-23 MED ORDER — ATORVASTATIN CALCIUM 40 MG PO TABS: 40.0000 mg | ORAL_TABLET | Freq: Every day | ORAL | 0 refills | Status: DC

## 2022-10-23 MED ORDER — VARENICLINE TARTRATE (STARTER) 0.5 MG X 11 & 1 MG X 42 PO TBPK
ORAL_TABLET | ORAL | 0 refills | Status: DC
Start: 2022-10-23 — End: 2023-02-22

## 2022-10-23 NOTE — Progress Notes (Signed)
[facility] [address] [place], [place] [address]  Internal MEDICINE  Office Visit Note  Patient Name: Raymond [name]  [id number]  [id number]  Date of Service: 10/23/2022  Chief Complaint  Patient presents with   Medicare Wellness   Hypertension   Diabetes    HPI Raymond [name] presents for an annual well visit and physical exam.  Well-appearing 57 y.o. male with hypertension, aortic atherosclerosis, OSA, emphysema, GERD, barrett's esophagus, diabetes, neck pain, anxiety, depression, PTSD, and insomnia. Currently has lung cancer, and has surgery scheduled for next week.  Routine CRC screening: due in may 2025 Eye exam: due now, last was done in march 2023 foot exam: done today.  Labs: deferred for now, patient has a lot of labs for preop testing for surgery  New or worsening pain: none  Not tolerating side effects of mounjaro. Wants to try something different.  Lung cancer confirmed, has surgery next week to remove the tumor.  Still smoking, wants assistance with smoking cessation.       10/23/2022   11:30 AM 10/19/2021    2:04 PM 10/18/2020    1:27 PM  MMSE - Mini Mental State Exam  Orientation to time 5 5 5   Orientation to Place 5 5 5   Registration 3 3 3   Attention/ Calculation 5 5 4   Recall 3 3 3   Language- name 2 objects 2 2 2   Language- repeat 1 1 1   Language- follow 3 step command 3 3 3   Language- read & follow direction 1 1 1   Write a sentence 1 1 1   Copy design 1 1 1   Total score 30 30 29     Functional Status Survey: Is the patient deaf or have difficulty hearing?: No Does the patient have difficulty seeing, even when wearing glasses/contacts?: Yes Does the patient have difficulty concentrating, remembering, or making decisions?: No Does the patient have difficulty walking or climbing stairs?: No Does the patient have difficulty dressing or bathing?: No Does the patient have difficulty doing errands alone such as visiting a doctor's office or  shopping?: No     11/19/2021   10:09 AM 01/18/2022    1:39 PM 04/13/2022    8:09 AM 07/27/2022    1:45 PM 10/23/2022   11:31 AM  Fall Risk  Falls in the past year?  0  0 0  Was there an injury with Fall?  0  0   Fall Risk Category Calculator  0  0   Fall Risk Category (Retired)  Low     (RETIRED) Patient Fall Risk Level Low fall risk Low fall risk Low fall risk    Patient at Risk for Falls Due to  No Fall Risks  No Fall Risks No Fall Risks  Fall risk Follow up  Falls evaluation completed  Falls evaluation completed Falls evaluation completed       10/23/2022   11:31 AM  Depression screen PHQ 2/9  Decreased Interest 1  Down, Depressed, Hopeless 0  PHQ - 2 Score 1       02/09/2022   10:42 AM 12/20/2021   11:41 AM 06/20/2021   10:14 AM 11/22/2020    1:18 PM  GAD 7 : Generalized Anxiety Score  Nervous, Anxious, on Edge      Control/stop worrying      Worry too much - different things      Trouble relaxing      Restless      Easily annoyed or irritable  Afraid - awful might happen      Total GAD 7 Score      Anxiety Difficulty         Information is confidential and restricted. Go to Review Flowsheets to unlock data.      Current Medication: Outpatient Encounter Medications as of 10/23/2022  Medication Sig Note   Semaglutide 7 MG TABS Take 1 tablet (7 mg total) by mouth daily before breakfast. On empty stomach with no more than 4 oz of water, wait 30 min before eating or drinking.    ACCU-CHEK FASTCLIX LANCETS MISC yse as directed twice a day    albuterol (VENTOLIN HFA) 108 (90 Base) MCG/ACT inhaler INHALE 2 PUFFS INTO THE LUNGS EVERY 6 HOURS AS MEEDED FOR WHEEZING OR SHORTNESS OF BREATH    aspirin EC 81 MG tablet Take 81 mg by mouth daily at 3 pm.     atorvastatin (LIPITOR) 40 MG tablet Take 1 tablet (40 mg total) by mouth daily.    BREZTRI AEROSPHERE 160-9-4.8 MCG/ACT AERO INHALE 2 PUFFS TWICE DAILY    buPROPion (WELLBUTRIN XL) 150 MG 24 hr tablet Take 1 tablet (150 mg  total) by mouth daily. Take along with 300 mg daily    buPROPion (WELLBUTRIN XL) 300 MG 24 hr tablet Take 1 tablet (300 mg total) by mouth daily. Take along with 150 mg daily    Continuous Blood Gluc Sensor (FREESTYLE LIBRE 3 SENSOR) MISC Apply 1 sensor to the skin every 14 days    cyclobenzaprine (FLEXERIL) 10 MG tablet Take 1 tablet (10 mg total) by mouth at bedtime as needed for muscle spasms.    dapagliflozin propanediol (FARXIGA) 10 MG TABS tablet Take 1 tablet (10 mg total) by mouth daily before breakfast.    DULoxetine (CYMBALTA) 30 MG capsule Take 1 capsule (30 mg total) by mouth daily. Take along with 60 mg daily, total of 90 mg daily    DULoxetine (CYMBALTA) 60 MG capsule TAKE 1 CAPSULE BY MOUTH ONCE DAILY (TAKE  ALONG  WITH  30MG   DAILY)    ezetimibe (ZETIA) 10 MG tablet Take 1 tablet (10 mg total) by mouth daily.    famotidine (PEPCID) 20 MG tablet Take 1 tablet (20 mg total) by mouth 2 (two) times daily.    gabapentin (NEURONTIN) 100 MG capsule Take 200 mg by mouth at bedtime.    gabapentin (NEURONTIN) 600 MG tablet Take 0.5 tablets (300 mg total) by mouth 2 (two) times daily. May increase to 1 whole tablet in PM after 1 week.    glucose blood (ACCU-CHEK AVIVA PLUS) test strip Blood sugar testing TID and as needed. E11.65    insulin glargine, 2 Unit Dial, (TOUJEO MAX SOLOSTAR) 300 UNIT/ML Solostar Pen Inject 70 Units into the skin daily.    Insulin Pen Needle 32G X 4 MM MISC Use with daily dose insulin and with sliding scale insulin as needed.    Melatonin 10 MG CAPS Take 20 mg by mouth at bedtime.    metoprolol tartrate (LOPRESSOR) 100 MG tablet Take 1 tablet (100 mg total) by mouth 2 (two) times daily.    nitroGLYCERIN (NITROSTAT) 0.4 MG SL tablet Place 1 tablet (0.4 mg total) under the tongue every 5 (five) minutes x 3 doses as needed for chest pain.    pantoprazole (PROTONIX) 40 MG tablet Take 1 tablet (40 mg total) by mouth daily.    tamsulosin (FLOMAX) 0.4 MG CAPS capsule Take 1  capsule (0.4 mg total) by mouth daily.  temazepam (RESTORIL) 30 MG capsule TAKE 1 CAPSULE BY MOUTH AT BEDTIME AS NEEDED FOR SLEEP (Patient taking differently: Take 30 mg by mouth at bedtime.)    Vibegron (GEMTESA) 75 MG TABS Take 1 tablet (75 mg total) by mouth daily. (Patient not taking: Reported on 10/20/2022)    [DISCONTINUED] atorvastatin (LIPITOR) 40 MG tablet Take 1 tablet by mouth once daily    [DISCONTINUED] cyclobenzaprine (FLEXERIL) 10 MG tablet TAKE 1 TABLET BY MOUTH AT BEDTIME AS NEEDED FOR  BACK  SPASM (Patient taking differently: Take 10 mg by mouth at bedtime.)    [DISCONTINUED] ezetimibe (ZETIA) 10 MG tablet Take 1 tablet (10 mg total) by mouth daily.    [DISCONTINUED] gabapentin (NEURONTIN) 600 MG tablet Take 0.5 tablets (300 mg total) by mouth 2 (two) times daily. May increase to 1 whole tablet in PM after 1 week. (Patient not taking: Reported on 10/20/2022)    [DISCONTINUED] metoprolol tartrate (LOPRESSOR) 100 MG tablet Take 1 tablet (100 mg total) by mouth 2 (two) times daily.    [DISCONTINUED] tamsulosin (FLOMAX) 0.4 MG CAPS capsule Take 1 capsule (0.4 mg total) by mouth daily. 10/20/2022: Pt ran out, plans to talk to dr before continuing    [DISCONTINUED] tirzepatide [facility]) 7.5 MG/0.5ML Pen Inject 7.5 mg into the skin once a week. 10/20/2022: Pt plans to talk to with dr about some side effects he's experiencing with this drug, currently on hold   No facility-administered encounter medications on file as of 10/23/2022.    Surgical History: Past Surgical History:  Procedure Laterality Date    ingrown toenail removal     CHOLECYSTECTOMY N/A 01/23/2019   Procedure: LAPAROSCOPIC CHOLECYSTECTOMY;  Surgeon: Henrene Dodge, MD;  Location: [facility] ORS;  Service: General;  Laterality: N/A;   CLAVICLE SURGERY Right    COLONOSCOPY  2017   COLONOSCOPY WITH PROPOFOL N/A 08/25/2020   Procedure: COLONOSCOPY WITH PROPOFOL;  Surgeon: Pasty Spillers, MD;  Location: [facility] ENDOSCOPY;  Service:  Endoscopy;  Laterality: N/A;   ESOPHAGOGASTRODUODENOSCOPY (EGD) WITH PROPOFOL N/A 08/25/2020   Procedure: ESOPHAGOGASTRODUODENOSCOPY (EGD) WITH PROPOFOL;  Surgeon: Pasty Spillers, MD;  Location: [facility] ENDOSCOPY;  Service: Endoscopy;  Laterality: N/A;   MOUTH SURGERY     REMOVED ALL TEETH   SHOULDER SURGERY Right    UPPER GI ENDOSCOPY  2017    Medical History: Past Medical History:  Diagnosis Date   Anxiety    Arthritis    NECK   Bronchitis    Coronary artery disease    Depression    Diabetes mellitus without complication (HCC)    Diastasis of rectus abdominis 06/19/2018   Dyspnea    DUE TO GALLBLADDER PER PT   Emphysema lung (HCC)    Family history of adverse reaction to anesthesia    N/V, TROUBLE BREATHING COMING OUT OF ANESTHESIA   Fatty liver    GERD (gastroesophageal reflux disease)    Headache    MIGRAINES   Hyperlipidemia    Hypertension    Irregular heart beat unk   Myocardial infarction (HCC) 2010   MILD    PTSD (post-traumatic stress disorder)    Sleep apnea    USES CPAP   Tachycardia     Family History: Family History  Problem Relation Age of Onset   Hypertension Mother    Diabetes type II Mother    Diabetes Mother    COPD Father    Cancer Father    Heart disease Father    Diabetes Sister    Anxiety  disorder Sister    Depression Sister    Diabetes Brother    Anxiety disorder Brother    Depression Brother    Diabetes Maternal Aunt    Diabetes Sister    Anxiety disorder Sister    Depression Sister    Diabetes Sister    Anxiety disorder Sister    Depression Sister    Diabetes Sister    Anxiety disorder Sister    Depression Sister    Colon cancer Maternal Uncle     Social History   Socioeconomic History   Marital status: Divorced    Spouse name: Not on file   Number of children: 4   Years of education: Not on file   Highest education level: 8th grade  Occupational History   Not on file  Tobacco Use   Smoking status: Every Day     Packs/day: 1.00    Years: 40.00    Additional pack years: 0.00    Total pack years: 40.00    Types: Cigarettes   Smokeless tobacco: Former    Types: Chew   Tobacco comments:    1 pack per day reported 01/03/2021  Vaping Use   Vaping Use: Former  Substance and Sexual Activity   Alcohol use: No    Alcohol/week: 0.0 standard drinks of alcohol   Drug use: No   Sexual activity: Not Currently  Other Topics Concern   Not on file  Social History Narrative   Not on file   Social Determinants of Health   Financial Resource Strain: Low Risk  (08/17/2020)   Overall Financial Resource Strain (CARDIA)    Difficulty of Paying Living Expenses: Not very hard  Food Insecurity: Food Insecurity Present (05/23/2018)   Hunger Vital Sign    Worried About Running Out of Food in the Last Year: Often true    Ran Out of Food in the Last Year: Often true  Transportation Needs: No Transportation Needs (05/23/2018)   PRAPARE - Administrator, Civil Service (Medical): No    Lack of Transportation (Non-Medical): No  Physical Activity: Inactive (05/23/2018)   Exercise Vital Sign    Days of Exercise per Week: 0 days    Minutes of Exercise per Session: 0 min  Stress: Stress Concern Present (05/23/2018)   [employer] of Occupational Health - Occupational Stress Questionnaire    Feeling of Stress : Very much  Social Connections: Unknown (05/23/2018)   Social Connection and Isolation Panel [NHANES]    Frequency of Communication with Friends and Family: Not on file    Frequency of Social Gatherings with Friends and Family: Not on file    Attends Religious Services: Never    Database administrator or Organizations: No    Attends Banker Meetings: Never    Marital Status: Divorced  Catering manager Violence: Not At Risk (05/23/2018)   Humiliation, Afraid, Rape, and Kick questionnaire    Fear of Current or Ex-Partner: No    Emotionally Abused: No    Physically Abused: No    Sexually  Abused: No      Review of Systems  Constitutional:  Negative for activity change, appetite change, chills, fatigue, fever and unexpected weight change.  HENT: Negative.  Negative for congestion, ear pain, rhinorrhea, sore throat and trouble swallowing.   Eyes: Negative.   Respiratory: Negative.  Negative for cough, chest tightness, shortness of breath and wheezing.   Cardiovascular: Negative.  Negative for chest pain and palpitations.  Gastrointestinal: Negative.  Negative for abdominal pain, blood in stool, constipation, diarrhea, nausea and vomiting.  Endocrine: Negative.   Genitourinary: Negative.  Negative for difficulty urinating, dysuria, frequency, hematuria and urgency.  Musculoskeletal:  Positive for arthralgias and joint swelling (left knee). Negative for back pain, myalgias and neck pain.  Skin: Negative.  Negative for rash and wound.  Allergic/Immunologic: Negative.  Negative for immunocompromised state.  Neurological: Negative.  Negative for dizziness, seizures, numbness and headaches.  Hematological: Negative.   Psychiatric/Behavioral: Negative.  Negative for behavioral problems, self-injury and suicidal ideas. The patient is not nervous/anxious.     Vital Signs: BP 122/79   Pulse 82   Temp 97.8 F (36.6 C)   Resp 16   Ht 5\' 9"  (1.753 m)   Wt 233 lb (105.7 kg)   SpO2 97%   BMI 34.41 kg/m    Physical Exam Vitals reviewed.  Constitutional:      General: He is not in acute distress.    Appearance: Normal appearance. He is obese. He is not ill-appearing.  HENT:     Head: Normocephalic and atraumatic.     Right Ear: Tympanic membrane, ear canal and external ear normal.     Left Ear: Tympanic membrane, ear canal and external ear normal.     Nose: Nose normal. No congestion or rhinorrhea.     Mouth/Throat:     Mouth: Mucous membranes are moist.     Pharynx: Oropharynx is clear. No oropharyngeal exudate or posterior oropharyngeal erythema.  Eyes:     Extraocular  Movements: Extraocular movements intact.     Conjunctiva/sclera: Conjunctivae normal.     Pupils: Pupils are equal, round, and reactive to light.  Neck:     Vascular: No carotid bruit.  Cardiovascular:     Rate and Rhythm: Normal rate and regular rhythm.     Pulses: Normal pulses.     Heart sounds: Normal heart sounds.  Pulmonary:     Effort: Pulmonary effort is normal. No respiratory distress.     Breath sounds: Normal breath sounds. No wheezing.  Abdominal:     General: Bowel sounds are normal. There is no distension (due to constipation).     Palpations: Abdomen is soft. There is no mass.     Tenderness: There is no abdominal tenderness (reports constipation and feeling bloated). There is no guarding or rebound.     Hernia: No hernia is present.  Musculoskeletal:        General: Normal range of motion.     Cervical back: Normal range of motion and neck supple. No rigidity.     Right foot: Normal range of motion. No deformity, bunion or foot drop.     Left foot: Normal range of motion. No deformity, bunion or foot drop.     Comments: Decreased ROM left knee and left hip  Feet:     Right foot:     Protective Sensation: 6 sites tested.  4 sites sensed.     Skin integrity: Callus and dry skin present. No ulcer, blister or erythema.     Toenail Condition: Right toenails are abnormally thick.     Left foot:     Protective Sensation: 6 sites tested.  3 sites sensed.     Skin integrity: Callus and dry skin present. No ulcer, blister or erythema.     Toenail Condition: Left toenails are abnormally thick.  Lymphadenopathy:     Cervical: No cervical adenopathy.  Skin:    General: Skin is warm and dry.  Capillary Refill: Capillary refill takes less than 2 seconds.  Neurological:     Mental Status: He is alert and oriented to person, place, and time.  Psychiatric:        Mood and Affect: Mood normal.        Behavior: Behavior normal.        Thought Content: Thought content normal.         Judgment: Judgment normal.        Assessment/Plan: 1. Encounter for routine adult health examination with abnormal findings Age-appropriate preventive screenings and vaccinations discussed, annual physical exam completed. Routine labs for health maintenance ordered. PHM updated.   2. Uncontrolled type 2 diabetes mellitus with hyperglycemia (HCC) Start rybelsus after upcoming surgery. Stop mounjaro.  - Semaglutide 7 MG TABS; Take 1 tablet (7 mg total) by mouth daily before breakfast. On empty stomach with no more than 4 oz of water, wait 30 min before eating or drinking.  Dispense: 30 tablet; Refill: 3  3. Aortic atherosclerosis (HCC) Continue atorvastatin and ezetimibe as prescribed.  - ezetimibe (ZETIA) 10 MG tablet; Take 1 tablet (10 mg total) by mouth daily.  Dispense: 90 tablet; Refill: 3 - atorvastatin (LIPITOR) 40 MG tablet; Take 1 tablet (40 mg total) by mouth daily.  Dispense: 90 tablet; Refill: 0  4. Dysuria Routine urinalysis done  - UA/M w/rflx Culture, Routine  5. Encounter for medication review Medication list reviewed, updated, and  - gabapentin (NEURONTIN) 600 MG tablet; Take 0.5 tablets (300 mg total) by mouth 2 (two) times daily. May increase to 1 whole tablet in PM after 1 week.  Dispense: 60 tablet; Refill: 3 - tamsulosin (FLOMAX) 0.4 MG CAPS capsule; Take 1 capsule (0.4 mg total) by mouth daily.  Dispense: 30 capsule; Refill: 3 - metoprolol tartrate (LOPRESSOR) 100 MG tablet; Take 1 tablet (100 mg total) by mouth 2 (two) times daily.  Dispense: 180 tablet; Refill: 1 - cyclobenzaprine (FLEXERIL) 10 MG tablet; Take 1 tablet (10 mg total) by mouth at bedtime as needed for muscle spasms.  Dispense: 30 tablet; Refill: 5  6. Cigarette smoker motivated to quit Start chantix after upcoming surgery - Varenicline Tartrate, Starter, 0.5 MG X 11 & 1 MG X 42 TBPK; Take 0.5 mg by mouth once daily on days 1-3, then take 0.5 mg twice daily on days 4-7, then increase to 1  mg twice daily  Dispense: 53 each; Refill: 0     General Counseling: Raymond [name] verbalizes understanding of the findings of todays visit and agrees with plan of treatment. I have discussed any further diagnostic evaluation that may be needed or ordered today. We also reviewed his medications today. he has been encouraged to call the office with any questions or concerns that should arise related to todays visit.    Orders Placed This Encounter  Procedures   UA/M w/rflx Culture, Routine    Meds ordered this encounter  Medications   Semaglutide 7 MG TABS    Sig: Take 1 tablet (7 mg total) by mouth daily before breakfast. On empty stomach with no more than 4 oz of water, wait 30 min before eating or drinking.    Dispense:  30 tablet    Refill:  3    Dx code E11.65   gabapentin (NEURONTIN) 600 MG tablet    Sig: Take 0.5 tablets (300 mg total) by mouth 2 (two) times daily. May increase to 1 whole tablet in PM after 1 week.    Dispense:  60 tablet  Refill:  3    Discontinue all previous orders for gabapentin and pregabalin, fill new prescription today.   ezetimibe (ZETIA) 10 MG tablet    Sig: Take 1 tablet (10 mg total) by mouth daily.    Dispense:  90 tablet    Refill:  3   tamsulosin (FLOMAX) 0.4 MG CAPS capsule    Sig: Take 1 capsule (0.4 mg total) by mouth daily.    Dispense:  30 capsule    Refill:  3    New med, fill today.   metoprolol tartrate (LOPRESSOR) 100 MG tablet    Sig: Take 1 tablet (100 mg total) by mouth 2 (two) times daily.    Dispense:  180 tablet    Refill:  1    Increased dose   cyclobenzaprine (FLEXERIL) 10 MG tablet    Sig: Take 1 tablet (10 mg total) by mouth at bedtime as needed for muscle spasms.    Dispense:  30 tablet    Refill:  5   atorvastatin (LIPITOR) 40 MG tablet    Sig: Take 1 tablet (40 mg total) by mouth daily.    Dispense:  90 tablet    Refill:  0    Return in about 3 months (around 01/23/2023) for F/U, Recheck A1C, Raymond [name]  PCP.   Total time spent:30 Minutes Time spent includes review of chart, medications, test results, and follow up plan with the patient.   [place] Controlled Substance Database was reviewed by me.  This patient was seen by Sallyanne Kuster, FNP-C in collaboration with Dr. Beverely Risen as a part of collaborative care agreement.  Savian Mazon R. Tedd Sias, MSN, FNP-C Internal medicine

## 2022-10-24 LAB — MICROSCOPIC EXAMINATION
Bacteria, UA: NONE SEEN
Casts: NONE SEEN /lpf
Epithelial Cells (non renal): NONE SEEN /hpf (ref 0–10)
RBC, Urine: NONE SEEN /hpf (ref 0–2)
WBC, UA: NONE SEEN /hpf (ref 0–5)

## 2022-10-24 LAB — UA/M W/RFLX CULTURE, ROUTINE
Bilirubin, UA: NEGATIVE
Ketones, UA: NEGATIVE
Leukocytes,UA: NEGATIVE
Nitrite, UA: NEGATIVE
Protein,UA: NEGATIVE
RBC, UA: NEGATIVE
Specific Gravity, UA: >=1.03 — AB (ref 1.005–1.030)
Urobilinogen, Ur: 0.2 mg/dL (ref 0.2–1.0)
pH, UA: 5 (ref 5.0–7.5)

## 2022-10-24 NOTE — Pre-Procedure Instructions (Signed)
Surgical Instructions    Your procedure is scheduled on October 27, 2022.  Report to [facility] Main Entrance "A" at 5:30 A.M., then check in with the Admitting office.  Call this number if you have problems the morning of surgery:  [phone]  If you have any questions prior to your surgery date call [phone]: Open Monday-Friday 8am-4pm If you experience any cold or flu symptoms such as cough, fever, chills, shortness of breath, etc. between now and your scheduled surgery, please notify us at the above number.     Remember:  Do not eat or drink after midnight the night before your surgery     Take these medicines the morning of surgery with A SIP OF WATER:  atorvastatin (LIPITOR)   BREZTRI AEROSPHERE inhaler  buPROPion (WELLBUTRIN XL)   DULoxetine (CYMBALTA)   ezetimibe (ZETIA)   famotidine (PEPCID)   gabapentin (NEURONTIN)   metoprolol tartrate (LOPRESSOR)   pantoprazole (PROTONIX)   tamsulosin [facility])     May take these medicines IF NEEDED:  albuterol (VENTOLIN HFA) inhaler   cyclobenzaprine (FLEXERIL)   nitroGLYCERIN (NITROSTAT) - please call [phone] if you take a dose prior to surgery.    As of today, STOP taking any Aleve, Naproxen, Ibuprofen, Motrin, Advil, Goody's, BC's, all herbal medications, fish oil, and all vitamins.   WHAT DO I DO ABOUT MY DIABETES MEDICATION?   STOP taking dapagliflozin propanediol (FARXIGA) three days prior to surgery. Your last dose will be July 1st.    THE NIGHT BEFORE SURGERY / MORNING OF SURGERY, take 35 units of insulin glargine (TOUJEO MAX SOLOSTAR) insulin.  STOP taking your Semaglutide 24 hours prior to surgery. Your last dose will be July 3rd.    HOW TO MANAGE YOUR DIABETES BEFORE AND AFTER SURGERY  Why is it important to control my blood sugar before and after surgery? Improving blood sugar levels before and after surgery helps healing and can limit problems. A way of improving blood sugar control is eating a  healthy diet by:  Eating less sugar and carbohydrates  Increasing activity/exercise  Talking with your doctor about reaching your blood sugar goals High blood sugars (greater than 180 mg/dL) can raise your risk of infections and slow your recovery, so you will need to focus on controlling your diabetes during the weeks before surgery. Make sure that the doctor who takes care of your diabetes knows about your planned surgery including the date and location.  How do I manage my blood sugar before surgery? Check your blood sugar at least 4 times a day, starting 2 days before surgery, to make sure that the level is not too high or low.  Check your blood sugar the morning of your surgery when you wake up and every 2 hours until you get to the Short Stay unit.  If your blood sugar is less than 70 mg/dL, you will need to treat for low blood sugar: Do not take insulin. Treat a low blood sugar (less than 70 mg/dL) with  cup of clear juice (cranberry or apple), 4 glucose tablets, OR glucose gel. Recheck blood sugar in 15 minutes after treatment (to make sure it is greater than 70 mg/dL). If your blood sugar is not greater than 70 mg/dL on recheck, call [phone] for further instructions. Report your blood sugar to the short stay nurse when you get to Short Stay.  If you are admitted to the hospital after surgery: Your blood sugar will be checked by the staff and you will  probably be given insulin after surgery (instead of oral diabetes medicines) to make sure you have good blood sugar levels. The goal for blood sugar control after surgery is 80-180 mg/dL.                      Do NOT Smoke (Tobacco/Vaping) for 24 hours prior to your procedure.  If you use a CPAP at night, you may bring your mask/headgear for your overnight stay.   Contacts, glasses, piercing's, hearing aid's, dentures or partials may not be worn into surgery, please bring cases for these belongings.    For patients admitted to  the hospital, discharge time will be determined by your treatment team.   Patients discharged the day of surgery will not be allowed to drive home, and someone needs to stay with them for 24 hours.  SURGICAL WAITING ROOM VISITATION Patients having surgery or a procedure may have no more than 2 support people in the waiting area - these visitors may rotate.   Children under the age of 16 must have an adult with them who is not the patient. If the patient needs to stay at the hospital during part of their recovery, the visitor guidelines for inpatient rooms apply. Pre-op nurse will coordinate an appropriate time for 1 support person to accompany patient in pre-op.  This support person may not rotate.   Please refer to the [facility] website for the visitor guidelines for Inpatients (after your surgery is over and you are in a regular room).    Special instructions:   [place]- Preparing For Surgery  Before surgery, you can play an important role. Because skin is not sterile, your skin needs to be as free of germs as possible. You can reduce the number of germs on your skin by washing with CHG (chlorahexidine gluconate) Soap before surgery.  CHG is an antiseptic cleaner which kills germs and bonds with the skin to continue killing germs even after washing.    Oral Hygiene is also important to reduce your risk of infection.  Remember - BRUSH YOUR TEETH THE MORNING OF SURGERY WITH YOUR REGULAR TOOTHPASTE  Please do not use if you have an allergy to CHG or antibacterial soaps. If your skin becomes reddened/irritated stop using the CHG.  Do not shave (including legs and underarms) for at least 48 hours prior to first CHG shower. It is OK to shave your face.  Please follow these instructions carefully.   Shower the NIGHT BEFORE SURGERY and the MORNING OF SURGERY  If you chose to wash your hair, wash your hair first as usual with your normal shampoo.  After you shampoo, rinse your hair and  body thoroughly to remove the shampoo.  Use CHG Soap as you would any other liquid soap. You can apply CHG directly to the skin and wash gently with a scrungie or a clean washcloth.   Apply the CHG Soap to your body ONLY FROM THE NECK DOWN.  Do not use on open wounds or open sores. Avoid contact with your eyes, ears, mouth and genitals (private parts). Wash Face and genitals (private parts)  with your normal soap.   Wash thoroughly, paying special attention to the area where your surgery will be performed.  Thoroughly rinse your body with warm water from the neck down.  DO NOT shower/wash with your normal soap after using and rinsing off the CHG Soap.  Pat yourself dry with a CLEAN TOWEL.  Wear CLEAN PAJAMAS to  bed the night before surgery  Place CLEAN SHEETS on your bed the night before your surgery  DO NOT SLEEP WITH PETS.   Day of Surgery: Take a shower with CHG soap. Do not wear jewelry or makeup Do not wear lotions, powders, perfumes/colognes, or deodorant. Do not shave 48 hours prior to surgery.  Men may shave face and neck. Do not bring valuables to the hospital.  [facility] is not responsible for any belongings or valuables. Do not wear nail polish, gel polish, artificial nails, or any other type of covering on natural nails (fingers and toes) If you have artificial nails or gel coating that need to be removed by a nail salon, please have this removed prior to surgery. Artificial nails or gel coating may interfere with anesthesia's ability to adequately monitor your vital signs.  Wear Clean/Comfortable clothing the morning of surgery Remember to brush your teeth WITH YOUR REGULAR TOOTHPASTE.   Please read over the following fact sheets that you were given.    If you received a COVID test during your pre-op visit  it is requested that you wear a mask when out in public, stay away from anyone that may not be feeling well and notify your surgeon if you develop symptoms. If  you have been in contact with anyone that has tested positive in the last 10 days please notify you surgeon.

## 2022-10-25 ENCOUNTER — Ambulatory Visit (HOSPITAL_COMMUNITY)
Admission: RE | Admit: 2022-10-25 | Discharge: 2022-10-25 | Disposition: A | Discharge status: Home / Self Care | Payer: 59 | Source: Ambulatory Visit | Attending: Thoracic Surgery (Cardiothoracic Vascular Surgery) | Admitting: Thoracic Surgery (Cardiothoracic Vascular Surgery)

## 2022-10-25 ENCOUNTER — Other Ambulatory Visit: Payer: Self-pay

## 2022-10-25 ENCOUNTER — Encounter (HOSPITAL_COMMUNITY)
Admission: RE | Admit: 2022-10-25 | Discharge: 2022-10-25 | Disposition: A | Discharge status: Home / Self Care | Payer: 59 | Source: Ambulatory Visit | Attending: Thoracic Surgery (Cardiothoracic Vascular Surgery) | Admitting: Thoracic Surgery (Cardiothoracic Vascular Surgery)

## 2022-10-25 ENCOUNTER — Ambulatory Visit: Payer: 59 | Admitting: Nurse Practitioner

## 2022-10-25 ENCOUNTER — Encounter (HOSPITAL_COMMUNITY): Payer: Self-pay

## 2022-10-25 VITALS — BP 133/79 | HR 78 | Temp 98.3°F | Resp 18 | Ht 69.0 in | Wt 233.7 lb

## 2022-10-25 DIAGNOSIS — Z888 Allergy status to other drugs, medicaments and biological substances status: Secondary | ICD-10-CM | POA: Diagnosis not present

## 2022-10-25 DIAGNOSIS — K219 Gastro-esophageal reflux disease without esophagitis: Secondary | ICD-10-CM | POA: Diagnosis not present

## 2022-10-25 DIAGNOSIS — E119 Type 2 diabetes mellitus without complications: Secondary | ICD-10-CM | POA: Insufficient documentation

## 2022-10-25 DIAGNOSIS — Z79899 Other long term (current) drug therapy: Secondary | ICD-10-CM | POA: Diagnosis not present

## 2022-10-25 DIAGNOSIS — E1151 Type 2 diabetes mellitus with diabetic peripheral angiopathy without gangrene: Secondary | ICD-10-CM | POA: Diagnosis not present

## 2022-10-25 DIAGNOSIS — Z01818 Encounter for other preprocedural examination: Secondary | ICD-10-CM

## 2022-10-25 DIAGNOSIS — E785 Hyperlipidemia, unspecified: Secondary | ICD-10-CM | POA: Diagnosis not present

## 2022-10-25 DIAGNOSIS — G4730 Sleep apnea, unspecified: Secondary | ICD-10-CM | POA: Diagnosis not present

## 2022-10-25 DIAGNOSIS — I10 Essential (primary) hypertension: Secondary | ICD-10-CM | POA: Diagnosis not present

## 2022-10-25 DIAGNOSIS — J439 Emphysema, unspecified: Secondary | ICD-10-CM | POA: Diagnosis not present

## 2022-10-25 DIAGNOSIS — I252 Old myocardial infarction: Secondary | ICD-10-CM | POA: Diagnosis not present

## 2022-10-25 DIAGNOSIS — Z91048 Other nonmedicinal substance allergy status: Secondary | ICD-10-CM | POA: Diagnosis not present

## 2022-10-25 DIAGNOSIS — I2510 Atherosclerotic heart disease of native coronary artery without angina pectoris: Secondary | ICD-10-CM | POA: Diagnosis not present

## 2022-10-25 DIAGNOSIS — Z833 Family history of diabetes mellitus: Secondary | ICD-10-CM | POA: Diagnosis not present

## 2022-10-25 DIAGNOSIS — C3431 Malignant neoplasm of lower lobe, right bronchus or lung: Secondary | ICD-10-CM | POA: Diagnosis not present

## 2022-10-25 DIAGNOSIS — G901 Familial dysautonomia [Riley-Day]: Secondary | ICD-10-CM | POA: Diagnosis not present

## 2022-10-25 DIAGNOSIS — F32A Depression, unspecified: Secondary | ICD-10-CM | POA: Diagnosis not present

## 2022-10-25 DIAGNOSIS — Z825 Family history of asthma and other chronic lower respiratory diseases: Secondary | ICD-10-CM | POA: Diagnosis not present

## 2022-10-25 DIAGNOSIS — R911 Solitary pulmonary nodule: Secondary | ICD-10-CM | POA: Insufficient documentation

## 2022-10-25 DIAGNOSIS — Z1152 Encounter for screening for COVID-19: Secondary | ICD-10-CM | POA: Insufficient documentation

## 2022-10-25 DIAGNOSIS — Z8249 Family history of ischemic heart disease and other diseases of the circulatory system: Secondary | ICD-10-CM | POA: Diagnosis not present

## 2022-10-25 DIAGNOSIS — J9811 Atelectasis: Secondary | ICD-10-CM | POA: Diagnosis not present

## 2022-10-25 DIAGNOSIS — Z885 Allergy status to narcotic agent status: Secondary | ICD-10-CM | POA: Diagnosis not present

## 2022-10-25 DIAGNOSIS — I700 Atherosclerosis of aorta: Secondary | ICD-10-CM | POA: Diagnosis not present

## 2022-10-25 DIAGNOSIS — Z794 Long term (current) use of insulin: Secondary | ICD-10-CM | POA: Diagnosis not present

## 2022-10-25 HISTORY — DX: Cardiac arrhythmia, unspecified: I49.9

## 2022-10-25 HISTORY — DX: Ventral hernia without obstruction or gangrene: K43.9

## 2022-10-25 LAB — URINALYSIS, ROUTINE W REFLEX MICROSCOPIC
Bacteria, UA: NONE SEEN
Bilirubin Urine: NEGATIVE
Glucose, UA: >=500 mg/dL — AB
Hgb urine dipstick: NEGATIVE
Ketones, ur: NEGATIVE mg/dL
Leukocytes,Ua: NEGATIVE
Nitrite: NEGATIVE
Protein, ur: NEGATIVE mg/dL
Specific Gravity, Urine: 1.028 (ref 1.005–1.030)
pH: 5 (ref 5.0–8.0)

## 2022-10-25 LAB — BLOOD GAS, ARTERIAL
Acid-base deficit: 1.2 mmol/L (ref 0.0–2.0)
Bicarbonate: 23.6 mmol/L (ref 20.0–28.0)
Drawn by: 587931
O2 Saturation: 97 %
Patient temperature: 37
pCO2 arterial: 39 mmHg (ref 32–48)
pH, Arterial: 7.39 (ref 7.35–7.45)
pO2, Arterial: 79 mmHg — ABNORMAL LOW (ref 83–108)

## 2022-10-25 LAB — COMPREHENSIVE METABOLIC PANEL
ALT: 20 U/L (ref 0–44)
AST: 17 U/L (ref 15–41)
Albumin: 3.7 g/dL (ref 3.5–5.0)
Alkaline Phosphatase: 109 U/L (ref 38–126)
Anion gap: 16 — ABNORMAL HIGH (ref 5–15)
BUN: 16 mg/dL (ref 6–20)
CO2: 21 mmol/L — ABNORMAL LOW (ref 22–32)
Calcium: 9.5 mg/dL (ref 8.9–10.3)
Chloride: 97 mmol/L — ABNORMAL LOW (ref 98–111)
Creatinine, Ser: 0.91 mg/dL (ref 0.61–1.24)
GFR, Estimated: >60 mL/min (ref >60)
Glucose, Bld: 291 mg/dL — ABNORMAL HIGH (ref 70–99)
Potassium: 4.5 mmol/L (ref 3.5–5.1)
Sodium: 134 mmol/L — ABNORMAL LOW (ref 135–145)
Total Bilirubin: 0.5 mg/dL (ref 0.3–1.2)
Total Protein: 7.6 g/dL (ref 6.5–8.1)

## 2022-10-25 LAB — TYPE AND SCREEN
ABO/RH(D): O POS
Antibody Screen: NEGATIVE

## 2022-10-25 LAB — CBC
HCT: 45.5 % (ref 39.0–52.0)
Hemoglobin: 15.3 g/dL (ref 13.0–17.0)
MCH: 29.6 pg (ref 26.0–34.0)
MCHC: 33.6 g/dL (ref 30.0–36.0)
MCV: 88 fL (ref 80.0–100.0)
Platelets: 201 10*3/uL (ref 150–400)
RBC: 5.17 MIL/uL (ref 4.22–5.81)
RDW: 12.5 % (ref 11.5–15.5)
WBC: 8.5 10*3/uL (ref 4.0–10.5)
nRBC: 0 % (ref 0.0–0.2)

## 2022-10-25 LAB — APTT: aPTT: 25 seconds (ref 24–36)

## 2022-10-25 LAB — SARS CORONAVIRUS 2 (TAT 6-24 HRS): SARS Coronavirus 2: NEGATIVE

## 2022-10-25 LAB — PROTIME-INR
INR: 1 (ref 0.8–1.2)
Prothrombin Time: 13.4 seconds (ref 11.4–15.2)

## 2022-10-25 LAB — SURGICAL PCR SCREEN
MRSA, PCR: NEGATIVE
Staphylococcus aureus: NEGATIVE

## 2022-10-25 LAB — GLUCOSE, CAPILLARY: Glucose-Capillary: 326 mg/dL — ABNORMAL HIGH (ref 70–99)

## 2022-10-25 LAB — HEMOGLOBIN A1C
Hgb A1c MFr Bld: 8.8 % — ABNORMAL HIGH (ref 4.8–5.6)
Mean Plasma Glucose: 205.86 mg/dL

## 2022-10-25 NOTE — Progress Notes (Signed)
Anesthesia APP Evaluation:   Case: [id number] Date/Time: 10/27/22 0715   Procedure: XI ROBOTIC ASSISTED THORACOSCOPY-RIGHT LOWER LOBE SUPERIOR SEGMENTECTOMY (Right: Chest)   Anesthesia type: General   Pre-op diagnosis: RLL LUNG NODULE   Location: [facility] ROOM 10 / [facility] OR   Surgeons: Loreli Slot, MD       DISCUSSION: Patient is a 57 year old male scheduled for the above procedure. He had a RLL lung lesion on [facility] Chest CT.   History includes smoking, emphysema, HTN, HLD, CAD (LAD & LCX atherosclerosis 08/31/22 CT, denied prior LHC), MI (~ 2007 but unsure, may have been secondary to demand ischemia with SVT, he denied prior cardiac cath), tachycardia, DM2, GERD, PTSD, OSA (uses CPAP intermittently), dyspnea, ventral hernia. 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  I evaluated Raymond [name] at his PAT visit due to somewhat vague cardiac history. He believes he was told he had a myocardial infarction at some point (one note mentions 2007, but he is unsure). He was never referred for a cardiac cath. He does describe an episode that sounded like SVT because he mentioned it resolving with medication, otherwise he may have required a shock to return to normal rhythm. Fortunately the IV medication was successful. It's possible he had elevated cardiac enzymes at that time. He has had at least six stress tests between 2004-2017. I cannot view results from his 11/12/02 stress test ([facility]/PACS), but he had normal/low risk stress test on 12/13/04, 03/28/07, 11/24/09, 12/09/12, and 05/14/15. Echo on 04/11/19 showed normal LVEF 60-65%, normal wall motion, trace MR. 10/25/22 EKG showed NSR.  He is not currently followed by cardiology, although it appears he was seen at [facility], MD in 2010 for syncope and palpitations and had a normal tilt table test, but POTS syndrome was at least considered at that time. He had a preoperative evaluation at [facility] by Manuella Ghazi, MD in 2015 prior to  clavicular surgery, and most recently he saw [facility] cardiologist Christella Noa, MD/[place], Minerva Areola, MD in 2017 for dyspnea. He had a technically difficult echo then with EF 45-50% followed by a non-ischemic PET myocardial perfusion study, so he was referred to pulmonary for additional evaluation. He was still have some intermittent palpitations and occasional syncope, so a Holter monitor was ordered. I don't see results, but he indicated he wore a monitor which did not show any significant arrhythmias. He says since then he has had no further syncope in ~ 6 years. He believes events were triggered by his tachycardia which can be triggered when he is upset. He now knows to rest and calm down if symptoms start to develop, and he says the b-blocker is also helpful. He denied chest pain. He has chronic mild ankle edema which he feels is stable. He has chronic dyspnea, primarily exertional/positional which he also feels is stable. He does have a large ventral hernia. + Smoker. His activity is limited by his breathing and knee arthritis, but says he is able to weed eat, work on cars (ie, changing out a transmission), and walk up a flight of stairs.   Dr. Dorris Fetch classified his Zubrod Score as 2: Ambulatory and capable of self care, unable to do work activities, up and about >50 % of waking hours. He wrote, "Based on his clinical history he be a marginal candidate for lobectomy, but should be able to tolerate a segmentectomy.  We do need his pulmonary function testing which was done in [place] a  couple of days ago but I am not able to access that via epic."  June PFTs felt "adequate pulmonary reserve to tolerate a resection."   PAT labs showed NA 134, glucose 291 (non-fasting), Creatinine 0.91, AST 17, ALT 20, normal CBC, PT/PTT. A1c 8.8%. Doe to misunderstanding of pre-surgical diabetes medication instructions, he did not take his insulin last night, so will resume as instructed. He reported typical fasting CBGs in  the 100's. Last [facility] 10/20/22 which is now discontinued with plans to start Rybelslus after surgery. He will get a CBG on arrival for surgery.  Reviewed with anesthesiologist Eilene Ghazi, MD. Anesthesia team to evaluate on the day of surgery. Presurgical COVID-19 and CXR results are still in process.   VS: BP 133/79   Pulse 78   Temp 36.8 C   Resp 18   Ht 5\' 9"  (1.753 m)   Wt 106 kg   SpO2 97%   BMI 34.51 kg/m  Heart RRR, no murmur noted. No carotid bruits noted. Lungs clean. Trace ankle edema. Edentulous (needs refitting for dentures).    PROVIDERS: Sallyanne Kuster, NP is PCP [facility]) Freda Munro, MD is Pulmonologist [facility])    LABS: Preoperative labs noted. See DISCUSSION. A1c 8.8, up from A1c 7.7% 07/27/22. (all labs ordered are listed, but only abnormal results are displayed)  Labs Reviewed  GLUCOSE, CAPILLARY - Abnormal; Notable for the following components:      Result Value   Glucose-Capillary 326 (*)    All other components within normal limits  COMPREHENSIVE METABOLIC PANEL - Abnormal; Notable for the following components:   Sodium 134 (*)    Chloride 97 (*)    CO2 21 (*)    Glucose, Bld 291 (*)    Anion gap 16 (*)    All other components within normal limits  BLOOD GAS, ARTERIAL - Abnormal; Notable for the following components:   pO2, Arterial 79 (*)    All other components within normal limits  URINALYSIS, ROUTINE W REFLEX MICROSCOPIC - Abnormal; Notable for the following components:   Glucose, UA >=500 (*)    All other components within normal limits  HEMOGLOBIN A1C - Abnormal; Notable for the following components:   Hgb A1c MFr Bld 8.8 (*)    All other components within normal limits  SURGICAL PCR SCREEN  SARS CORONAVIRUS 2 (TAT 6-24 HRS)  CBC  PROTIME-INR  APTT  TYPE AND SCREEN    Pulmonary function test 10/04/22: FVC 3.84 (84%) FEV1 3.06 (86%) FEV1 3.21 (90%) postbronchodilator DLCO 24.97 (92%) - Per Dr. Dorris Fetch, "Is  adequate pulmonary reserve to tolerate a resection."   IMAGES: PET Scan 09/20/22: IMPRESSION: - 15 mm bilobed nodule in the superior segment right lower lobe demonstrates mild hypermetabolism. While infectious/inflammatory etiologies are technically possible, this appearance is considered suspicious for primary bronchogenic carcinoma such as semi-invasive adenocarcinoma. - No evidence of metastatic disease.  CT Chest Lung Cancer Screening 08/31/22: IMPRESSION: 1. Lung-RADS 4B, suspicious. Additional imaging evaluation or consultation with Pulmonology or Thoracic Surgery recommended. New indistinct irregular solid superior segment right lower lobe pulmonary nodule measuring 12.5 mm in volume derived mean diameter, suspicious for primary bronchogenic malignancy. PET-CT suggested at this time for further characterization. 2. Two-vessel coronary atherosclerosis. 3. Aortic Atherosclerosis (ICD10-I70.0) and Emphysema (ICD10-J43.9).   EKG: 10/25/22: NSR   CV: LLE Venous US 04/13/22: IMPRESSION: Negative.   Echo 04/11/19: Left ventricle: Normal in size. Normal wall thickness. Normal systolic function. EF 60-65%. Normal LV segmental wall motion. Right ventricle: Normal  in size. Normal RV systolic function. Mitral Valve: Trace regurgitation.   PET Myocardial Perfusion Study 05/14/15 [facility]): Impressions:  - Normal myocardial perfusion study  - No evidence for significant ischemia or scar is noted.  - Mild coronary calcifications are noted  - Incidentally noted on the attenuation CT scan is hepatic steatosis.  Liver is incompletely visualized. Consider ultrasound to further evaluate  - During stress: Global systolic function is normal. The ejection fraction  calculated at 59%.    Tilt Table Test 01/21/09 ([facility]): IMPRESSION:  Patient with a history of syncope and palpitations had an effectively normal tilt table test.  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 Inderal 20 mg twice a day to help with some of his dysautonomia. the dose may need to be titirated up if his sxs don't improve  2.  Follow-up with his primary cardiologist, Dr. Dorothyann Peng, as scheduled and neurologic evaluation for his symptoms.    Past Medical History:  Diagnosis Date   Anxiety    Arthritis    NECK   Bronchitis    Coronary artery disease    Depression    Diabetes mellitus without complication (HCC)    Diastasis of rectus abdominis 06/19/2018   Dyspnea    DUE TO GALLBLADDER PER PT   Dysrhythmia    "fast hear rate at times. stopped when placed on metoprolol"   Emphysema lung (HCC)    Family history of adverse reaction to anesthesia    N/V, TROUBLE BREATHING COMING OUT OF ANESTHESIA   Fatty liver    GERD (gastroesophageal reflux disease)    Headache    MIGRAINES   Hyperlipidemia    Hypertension    Irregular heart beat unk   Myocardial infarction (HCC) 2010   MILD    PTSD (post-traumatic stress disorder)    Sleep apnea    USES CPAP   Tachycardia    Ventral hernia     Past Surgical History:  Procedure Laterality Date    ingrown toenail removal     CHOLECYSTECTOMY N/A 01/23/2019   Procedure: LAPAROSCOPIC CHOLECYSTECTOMY;  Surgeon: Henrene Dodge, MD;  Location: [facility] ORS;  Service: General;  Laterality: N/A;   CLAVICLE SURGERY Right    COLONOSCOPY  2017   COLONOSCOPY WITH PROPOFOL N/A 08/25/2020   Procedure: COLONOSCOPY WITH PROPOFOL;  Surgeon: Pasty Spillers, MD;  Location: [facility] ENDOSCOPY;  Service: Endoscopy;  Laterality: N/A;   ESOPHAGOGASTRODUODENOSCOPY (EGD) WITH PROPOFOL N/A 08/25/2020   Procedure: ESOPHAGOGASTRODUODENOSCOPY (EGD) WITH PROPOFOL;  Surgeon: Pasty Spillers, MD;  Location: [facility] ENDOSCOPY;  Service: Endoscopy;  Laterality: N/A;   MOUTH SURGERY     REMOVED ALL TEETH   SHOULDER SURGERY Right    UPPER GI ENDOSCOPY  2017    MEDICATIONS:  ACCU-CHEK  FASTCLIX LANCETS MISC   albuterol (VENTOLIN HFA) 108 (90 Base) MCG/ACT inhaler   aspirin EC 81 MG tablet   atorvastatin (LIPITOR) 40 MG tablet   BREZTRI AEROSPHERE 160-9-4.8 MCG/ACT AERO   buPROPion (WELLBUTRIN XL) 150 MG 24 hr tablet   buPROPion (WELLBUTRIN XL) 300 MG 24 hr tablet   Continuous Blood Gluc Sensor (FREESTYLE LIBRE 3 SENSOR) MISC   cyclobenzaprine (FLEXERIL) 10 MG tablet   dapagliflozin propanediol (FARXIGA) 10 MG TABS tablet   DULoxetine (CYMBALTA) 30 MG capsule   DULoxetine (CYMBALTA) 60 MG capsule   ezetimibe (ZETIA) 10 MG tablet   famotidine (PEPCID) 20 MG tablet  gabapentin (NEURONTIN) 100 MG capsule   gabapentin (NEURONTIN) 600 MG tablet   glucose blood (ACCU-CHEK AVIVA PLUS) test strip   insulin glargine, 2 Unit Dial, (TOUJEO MAX SOLOSTAR) 300 UNIT/ML Solostar Pen   Insulin Pen Needle 32G X 4 MM MISC   Melatonin 10 MG CAPS   metoprolol tartrate (LOPRESSOR) 100 MG tablet   nitroGLYCERIN (NITROSTAT) 0.4 MG SL tablet   pantoprazole (PROTONIX) 40 MG tablet   Semaglutide 7 MG TABS   tamsulosin (FLOMAX) 0.4 MG CAPS capsule   temazepam (RESTORIL) 30 MG capsule   Varenicline Tartrate, Starter, 0.5 MG X 11 & 1 MG X 42 TBPK   Vibegron (GEMTESA) 75 MG TABS   No current facility-administered medications for this encounter.    Shonna Chock, PA-C Surgical Short Stay/Anesthesiology [facility] Phone [phone] [facility] [address] Phone [phone] 10/25/2022 4:28 PM

## 2022-10-25 NOTE — Anesthesia Preprocedure Evaluation (Addendum)
Anesthesia Evaluation  Patient identified by MRN, date of birth, ID band Patient awake    Reviewed: Allergy & Precautions, NPO status , Patient's Chart, lab work & pertinent test results  Airway Mallampati: I  TM Distance: >3 FB Neck ROM: Full    Dental  (+) Edentulous Upper, Edentulous Lower   Pulmonary sleep apnea , COPD,  COPD inhaler, Current Smoker and Patient abstained from smoking.   breath sounds clear to auscultation       Cardiovascular hypertension, Pt. on home beta blockers + CAD and + Past MI  + dysrhythmias  Rhythm:Regular Rate:Normal     Neuro/Psych    GI/Hepatic Neg liver ROS,GERD  Medicated,,  Endo/Other  diabetes, Type 2, Insulin Dependent, Oral Hypoglycemic Agents    Renal/GU negative Renal ROS     Musculoskeletal  (+) Arthritis ,    Abdominal   Peds  Hematology negative hematology ROS (+)   Anesthesia Other Findings   Reproductive/Obstetrics                             Anesthesia Physical Anesthesia Plan  ASA: 3  Anesthesia Plan: General   Post-op Pain Management: Tylenol PO (pre-op)* and Toradol IV (intra-op)*   Induction: Intravenous  PONV Risk Score and Plan: 2 and Ondansetron and Midazolam  Airway Management Planned: Double Lumen EBT  Additional Equipment: Arterial line, CVP and Ultrasound Guidance Line Placement  Intra-op Plan:   Post-operative Plan: Extubation in OR  Informed Consent: I have reviewed the patients History and Physical, chart, labs and discussed the procedure including the risks, benefits and alternatives for the proposed anesthesia with the patient or authorized representative who has indicated his/her understanding and acceptance.     Dental advisory given  Plan Discussed with: CRNA  Anesthesia Plan Comments: (PAT note written 10/25/2022 by Shonna Chock, PA-C.  )       Anesthesia Quick Evaluation

## 2022-10-25 NOTE — Progress Notes (Addendum)
PCP - Sallyanne Kuster, NP Cardiologist - Pt saw some in 2017, but they never made another appointment for him so he never went back (Saw Christella Noa, Dorothey Baseman, and Michiel Cowboy)  PPM/ICD - Denies Device Orders - n/a Rep Notified - n/a  Chest x-ray - 10/25/2022 EKG - 10/25/2022 Stress Test - 05/14/2015 ([facility]) ECHO - 04/16/2019 Cardiac Cath - Denies  Sleep Study - +OSA but pt unable to tolerate CPAP every night due to acid reflux.   Pt is DM2. He wears a [employer] 3 although it is not on today for upcoming surgery. Normal fasting blood sugar is around 100. CBG at pre-op appointment was 326. For breakfast he had eggs, toast x2, oatmeal and a Pepsi. He has stopped his Marcelline Deist as instructed (Last dose July 1st), last dose of Mounjaro was Friday, June 28th (7/1 PCP discontinued this medication and started him on Rybelsus which he is starting after surgery is complete) and last dose of Toujeo Insulin was Monday night 7/1 (he thought he had to stop it prior to surgery). Last Aic was 7.30 July 2022. A1c drawn today and result pending.  Last dose of GLP1 agonist- Last dose [facility] Friday, June 28th GLP1 instructions: Medication has been discontinued by PCP, but pt still instructed not to take any additional doses prior to surgery.  Blood Thinner Instructions: n/a Aspirin Instructions: Pt can continue to take ASA through the day before surgery. He will take none morning of surgery.  NPO after midnight   COVID TEST- Yes. Result pending.   Anesthesia review: Yes. CBG 326 at pre-op appointment. Hx of MI, CAD, HTN, OSA, DM as well. Shonna Chock, PA-C discussed cardiac hx with patient during appointment.   Patient denies shortness of breath, fever, cough and chest pain at PAT appointment. Pt denies any respiratory illness/infection in the last two months.   All instructions explained to the patient, with a verbal understanding of the material. Patient agrees to go over the  instructions while at home for a better understanding. Patient also instructed to self quarantine after being tested for COVID-19. The opportunity to ask questions was provided.

## 2022-10-27 ENCOUNTER — Inpatient Hospital Stay (HOSPITAL_COMMUNITY): Payer: 59

## 2022-10-27 ENCOUNTER — Other Ambulatory Visit: Payer: Self-pay

## 2022-10-27 ENCOUNTER — Inpatient Hospital Stay (HOSPITAL_COMMUNITY): Payer: 59 | Admitting: Certified Registered Nurse Anesthetist

## 2022-10-27 ENCOUNTER — Inpatient Hospital Stay (HOSPITAL_COMMUNITY)
Admission: RE | Admit: 2022-10-27 | Discharge: 2022-10-30 | DRG: 164 | Disposition: A | Discharge status: Home / Self Care | Payer: 59 | Attending: Thoracic Surgery (Cardiothoracic Vascular Surgery) | Admitting: Thoracic Surgery (Cardiothoracic Vascular Surgery)

## 2022-10-27 ENCOUNTER — Encounter (HOSPITAL_COMMUNITY)
Admission: RE | Disposition: A | Discharge status: Home / Self Care | Payer: Self-pay | Source: Home / Self Care | Attending: Thoracic Surgery (Cardiothoracic Vascular Surgery)

## 2022-10-27 ENCOUNTER — Encounter (HOSPITAL_COMMUNITY): Payer: Self-pay | Admitting: Thoracic Surgery (Cardiothoracic Vascular Surgery)

## 2022-10-27 ENCOUNTER — Inpatient Hospital Stay (HOSPITAL_COMMUNITY): Payer: 59 | Admitting: Vascular Surgery

## 2022-10-27 DIAGNOSIS — Z885 Allergy status to narcotic agent status: Secondary | ICD-10-CM

## 2022-10-27 DIAGNOSIS — Z452 Encounter for adjustment and management of vascular access device: Secondary | ICD-10-CM | POA: Diagnosis not present

## 2022-10-27 DIAGNOSIS — K219 Gastro-esophageal reflux disease without esophagitis: Secondary | ICD-10-CM | POA: Diagnosis present

## 2022-10-27 DIAGNOSIS — G4730 Sleep apnea, unspecified: Secondary | ICD-10-CM | POA: Diagnosis present

## 2022-10-27 DIAGNOSIS — Z1152 Encounter for screening for COVID-19: Secondary | ICD-10-CM

## 2022-10-27 DIAGNOSIS — I252 Old myocardial infarction: Secondary | ICD-10-CM | POA: Diagnosis not present

## 2022-10-27 DIAGNOSIS — I2510 Atherosclerotic heart disease of native coronary artery without angina pectoris: Secondary | ICD-10-CM | POA: Diagnosis not present

## 2022-10-27 DIAGNOSIS — Z91048 Other nonmedicinal substance allergy status: Secondary | ICD-10-CM

## 2022-10-27 DIAGNOSIS — E1151 Type 2 diabetes mellitus with diabetic peripheral angiopathy without gangrene: Secondary | ICD-10-CM | POA: Diagnosis not present

## 2022-10-27 DIAGNOSIS — J9811 Atelectasis: Secondary | ICD-10-CM | POA: Diagnosis not present

## 2022-10-27 DIAGNOSIS — Z888 Allergy status to other drugs, medicaments and biological substances status: Secondary | ICD-10-CM

## 2022-10-27 DIAGNOSIS — R911 Solitary pulmonary nodule: Secondary | ICD-10-CM | POA: Diagnosis present

## 2022-10-27 DIAGNOSIS — F419 Anxiety disorder, unspecified: Secondary | ICD-10-CM | POA: Diagnosis present

## 2022-10-27 DIAGNOSIS — Z48813 Encounter for surgical aftercare following surgery on the respiratory system: Secondary | ICD-10-CM | POA: Diagnosis not present

## 2022-10-27 DIAGNOSIS — F32A Depression, unspecified: Secondary | ICD-10-CM | POA: Diagnosis present

## 2022-10-27 DIAGNOSIS — G901 Familial dysautonomia [Riley-Day]: Secondary | ICD-10-CM | POA: Diagnosis not present

## 2022-10-27 DIAGNOSIS — Z833 Family history of diabetes mellitus: Secondary | ICD-10-CM | POA: Diagnosis not present

## 2022-10-27 DIAGNOSIS — E785 Hyperlipidemia, unspecified: Secondary | ICD-10-CM | POA: Diagnosis present

## 2022-10-27 DIAGNOSIS — Z818 Family history of other mental and behavioral disorders: Secondary | ICD-10-CM | POA: Diagnosis not present

## 2022-10-27 DIAGNOSIS — Z87891 Personal history of nicotine dependence: Secondary | ICD-10-CM | POA: Diagnosis not present

## 2022-10-27 DIAGNOSIS — Z79899 Other long term (current) drug therapy: Secondary | ICD-10-CM

## 2022-10-27 DIAGNOSIS — F4310 Post-traumatic stress disorder, unspecified: Secondary | ICD-10-CM | POA: Diagnosis not present

## 2022-10-27 DIAGNOSIS — J439 Emphysema, unspecified: Secondary | ICD-10-CM | POA: Diagnosis present

## 2022-10-27 DIAGNOSIS — Z809 Family history of malignant neoplasm, unspecified: Secondary | ICD-10-CM

## 2022-10-27 DIAGNOSIS — I10 Essential (primary) hypertension: Secondary | ICD-10-CM | POA: Diagnosis present

## 2022-10-27 DIAGNOSIS — Z9889 Other specified postprocedural states: Secondary | ICD-10-CM

## 2022-10-27 DIAGNOSIS — Z8249 Family history of ischemic heart disease and other diseases of the circulatory system: Secondary | ICD-10-CM | POA: Diagnosis not present

## 2022-10-27 DIAGNOSIS — C3431 Malignant neoplasm of lower lobe, right bronchus or lung: Secondary | ICD-10-CM | POA: Diagnosis not present

## 2022-10-27 DIAGNOSIS — Z825 Family history of asthma and other chronic lower respiratory diseases: Secondary | ICD-10-CM

## 2022-10-27 DIAGNOSIS — Z794 Long term (current) use of insulin: Secondary | ICD-10-CM | POA: Diagnosis not present

## 2022-10-27 DIAGNOSIS — Z7982 Long term (current) use of aspirin: Secondary | ICD-10-CM

## 2022-10-27 DIAGNOSIS — J939 Pneumothorax, unspecified: Secondary | ICD-10-CM | POA: Diagnosis not present

## 2022-10-27 DIAGNOSIS — Z7985 Long-term (current) use of injectable non-insulin antidiabetic drugs: Secondary | ICD-10-CM

## 2022-10-27 DIAGNOSIS — Z91018 Allergy to other foods: Secondary | ICD-10-CM

## 2022-10-27 DIAGNOSIS — T797XXA Traumatic subcutaneous emphysema, initial encounter: Secondary | ICD-10-CM | POA: Diagnosis not present

## 2022-10-27 DIAGNOSIS — Z4682 Encounter for fitting and adjustment of non-vascular catheter: Secondary | ICD-10-CM | POA: Diagnosis not present

## 2022-10-27 DIAGNOSIS — Z7951 Long term (current) use of inhaled steroids: Secondary | ICD-10-CM

## 2022-10-27 DIAGNOSIS — E119 Type 2 diabetes mellitus without complications: Secondary | ICD-10-CM

## 2022-10-27 DIAGNOSIS — Z800 Family history of malignant neoplasm of digestive organs: Secondary | ICD-10-CM

## 2022-10-27 DIAGNOSIS — F17210 Nicotine dependence, cigarettes, uncomplicated: Secondary | ICD-10-CM | POA: Diagnosis present

## 2022-10-27 DIAGNOSIS — K439 Ventral hernia without obstruction or gangrene: Secondary | ICD-10-CM | POA: Diagnosis present

## 2022-10-27 HISTORY — PX: LYMPH NODE DISSECTION: SHX5087

## 2022-10-27 HISTORY — PX: XI ROBOTIC ASSISTED THORACOSCOPY- SEGMENTECTOMY: SHX6881

## 2022-10-27 LAB — GLUCOSE, CAPILLARY
Glucose-Capillary: 187 mg/dL — ABNORMAL HIGH (ref 70–99)
Glucose-Capillary: 258 mg/dL — ABNORMAL HIGH (ref 70–99)
Glucose-Capillary: 236 mg/dL — ABNORMAL HIGH (ref 70–99)
Glucose-Capillary: 316 mg/dL — ABNORMAL HIGH (ref 70–99)

## 2022-10-27 LAB — ABO/RH: ABO/RH(D): O POS

## 2022-10-27 SURGERY — RESECTION, LUNG, SEGMENTAL, ROBOT-ASSISTED
Anesthesia: General | Site: Chest | Laterality: Right

## 2022-10-27 MED ORDER — LUNG SURGERY BOOK
Freq: Once | Status: AC
  Filled 2022-10-27: qty 1

## 2022-10-27 MED ORDER — ACETAMINOPHEN 325 MG PO TABS: 325.0000 mg | ORAL_TABLET | ORAL | Status: DC | PRN

## 2022-10-27 MED ORDER — CHLORHEXIDINE GLUCONATE CLOTH 2 % EX PADS
6.0000 | MEDICATED_PAD | Freq: Every day | CUTANEOUS | Status: DC
  Administered 2022-10-27 – 2022-10-30 (×4): 6 via TOPICAL

## 2022-10-27 MED ORDER — SUGAMMADEX SODIUM 200 MG/2ML IV SOLN
INTRAVENOUS | Status: DC | PRN
  Administered 2022-10-27: 211.4 mg via INTRAVENOUS

## 2022-10-27 MED ORDER — CEFAZOLIN SODIUM-DEXTROSE 2-4 GM/100ML-% IV SOLN
2.0000 g | INTRAVENOUS | Status: AC
  Administered 2022-10-27: 2 g via INTRAVENOUS
  Filled 2022-10-27: qty 100

## 2022-10-27 MED ORDER — PROPOFOL 10 MG/ML IV BOLUS
INTRAVENOUS | Status: DC | PRN
  Administered 2022-10-27: 130 mg via INTRAVENOUS

## 2022-10-27 MED ORDER — INSULIN ASPART 100 UNIT/ML IJ SOLN
0.0000 [IU] | Freq: Four times a day (QID) | INTRAMUSCULAR | Status: DC
  Administered 2022-10-27: 16 [IU] via SUBCUTANEOUS
  Administered 2022-10-28: 8 [IU] via SUBCUTANEOUS
  Administered 2022-10-28: 12 [IU] via SUBCUTANEOUS

## 2022-10-27 MED ORDER — DEXAMETHASONE SODIUM PHOSPHATE 10 MG/ML IJ SOLN
INTRAMUSCULAR | Status: DC | PRN
  Administered 2022-10-27: 10 mg via INTRAVENOUS

## 2022-10-27 MED ORDER — OXYCODONE HCL 5 MG/5ML PO SOLN: 5.0000 mg | Freq: Once | ORAL | Status: AC | PRN

## 2022-10-27 MED ORDER — MIDAZOLAM HCL 2 MG/2ML IJ SOLN
INTRAMUSCULAR | Status: DC | PRN
  Administered 2022-10-27: 2 mg via INTRAVENOUS

## 2022-10-27 MED ORDER — BUPROPION HCL ER (XL) 150 MG PO TB24
450.0000 mg | ORAL_TABLET | Freq: Every day | ORAL | Status: DC
  Administered 2022-10-28 – 2022-10-30 (×3): 450 mg via ORAL
  Filled 2022-10-27 (×4): qty 3

## 2022-10-27 MED ORDER — MOMETASONE FURO-FORMOTEROL FUM 200-5 MCG/ACT IN AERO
2.0000 | INHALATION_SPRAY | Freq: Two times a day (BID) | RESPIRATORY_TRACT | Status: DC
  Administered 2022-10-27 – 2022-10-30 (×6): 2 via RESPIRATORY_TRACT
  Filled 2022-10-27: qty 8.8

## 2022-10-27 MED ORDER — SUGAMMADEX SODIUM 200 MG/2ML IV SOLN: INTRAVENOUS | Status: DC | PRN

## 2022-10-27 MED ORDER — SODIUM CHLORIDE 0.9 % IV SOLN: INTRAVENOUS | Status: DC | PRN

## 2022-10-27 MED ORDER — ALBUTEROL SULFATE (2.5 MG/3ML) 0.083% IN NEBU: 2.5000 mg | INHALATION_SOLUTION | Freq: Four times a day (QID) | RESPIRATORY_TRACT | Status: DC | PRN

## 2022-10-27 MED ORDER — ASPIRIN 81 MG PO TBEC
81.0000 mg | DELAYED_RELEASE_TABLET | Freq: Every day | ORAL | Status: DC
  Administered 2022-10-27 – 2022-10-29 (×3): 81 mg via ORAL
  Filled 2022-10-27 (×3): qty 1

## 2022-10-27 MED ORDER — ORAL CARE MOUTH RINSE: 15.0000 mL | Freq: Once | OROMUCOSAL | Status: AC

## 2022-10-27 MED ORDER — CEFAZOLIN SODIUM-DEXTROSE 2-4 GM/100ML-% IV SOLN
2.0000 g | Freq: Three times a day (TID) | INTRAVENOUS | Status: AC
  Administered 2022-10-27 – 2022-10-28 (×2): 2 g via INTRAVENOUS
  Filled 2022-10-27 (×2): qty 100

## 2022-10-27 MED ORDER — PANTOPRAZOLE SODIUM 40 MG PO TBEC
40.0000 mg | DELAYED_RELEASE_TABLET | Freq: Every day | ORAL | Status: DC
  Administered 2022-10-28 – 2022-10-30 (×3): 40 mg via ORAL
  Filled 2022-10-27 (×3): qty 1

## 2022-10-27 MED ORDER — FENTANYL CITRATE (PF) 250 MCG/5ML IJ SOLN
INTRAMUSCULAR | Status: AC
  Filled 2022-10-27: qty 5

## 2022-10-27 MED ORDER — PHENYLEPHRINE HCL-NACL 20-0.9 MG/250ML-% IV SOLN
INTRAVENOUS | Status: DC | PRN
  Administered 2022-10-27: 25 ug/min via INTRAVENOUS

## 2022-10-27 MED ORDER — KETOROLAC TROMETHAMINE 30 MG/ML IJ SOLN
INTRAMUSCULAR | Status: DC | PRN
  Administered 2022-10-27: 30 mg via INTRAVENOUS

## 2022-10-27 MED ORDER — ACETAMINOPHEN 160 MG/5ML PO SOLN: 325.0000 mg | ORAL | Status: DC | PRN

## 2022-10-27 MED ORDER — CHLORHEXIDINE GLUCONATE 0.12 % MT SOLN
15.0000 mL | Freq: Once | OROMUCOSAL | Status: AC
  Administered 2022-10-27: 15 mL via OROMUCOSAL
  Filled 2022-10-27: qty 15

## 2022-10-27 MED ORDER — UMECLIDINIUM BROMIDE 62.5 MCG/ACT IN AEPB
1.0000 | INHALATION_SPRAY | Freq: Every day | RESPIRATORY_TRACT | Status: DC
  Administered 2022-10-28 – 2022-10-30 (×3): 1 via RESPIRATORY_TRACT
  Filled 2022-10-27: qty 7

## 2022-10-27 MED ORDER — DULOXETINE HCL 60 MG PO CPEP: 60.0000 mg | ORAL_CAPSULE | Freq: Every day | ORAL | Status: DC

## 2022-10-27 MED ORDER — OXYCODONE HCL 5 MG PO TABS
ORAL_TABLET | ORAL | Status: AC
  Filled 2022-10-27: qty 1

## 2022-10-27 MED ORDER — BUPROPION HCL ER (XL) 150 MG PO TB24: 150.0000 mg | ORAL_TABLET | Freq: Every day | ORAL | Status: DC

## 2022-10-27 MED ORDER — LIDOCAINE 2% (20 MG/ML) 5 ML SYRINGE
INTRAMUSCULAR | Status: DC | PRN
  Administered 2022-10-27: 40 mg via INTRAVENOUS

## 2022-10-27 MED ORDER — BUPIVACAINE LIPOSOME 1.3 % IJ SUSP
INTRAMUSCULAR | Status: AC
  Filled 2022-10-27: qty 20

## 2022-10-27 MED ORDER — SODIUM CHLORIDE FLUSH 0.9 % IV SOLN
INTRAVENOUS | Status: DC | PRN
  Administered 2022-10-27: 100 mL

## 2022-10-27 MED ORDER — DULOXETINE HCL 60 MG PO CPEP
90.0000 mg | ORAL_CAPSULE | Freq: Every day | ORAL | Status: DC
  Administered 2022-10-28 – 2022-10-30 (×3): 90 mg via ORAL
  Filled 2022-10-27 (×3): qty 1

## 2022-10-27 MED ORDER — ONDANSETRON HCL 4 MG/2ML IJ SOLN
INTRAMUSCULAR | Status: DC | PRN
  Administered 2022-10-27: 4 mg via INTRAVENOUS

## 2022-10-27 MED ORDER — ALBUMIN HUMAN 5 % IV SOLN: INTRAVENOUS | Status: DC | PRN

## 2022-10-27 MED ORDER — TAMSULOSIN HCL 0.4 MG PO CAPS
0.4000 mg | ORAL_CAPSULE | Freq: Every day | ORAL | Status: DC
  Administered 2022-10-28 – 2022-10-30 (×3): 0.4 mg via ORAL
  Filled 2022-10-27 (×3): qty 1

## 2022-10-27 MED ORDER — 0.9 % SODIUM CHLORIDE (POUR BTL) OPTIME
TOPICAL | Status: DC | PRN
  Administered 2022-10-27: 2000 mL

## 2022-10-27 MED ORDER — INDOCYANINE GREEN 25 MG IV SOLR
INTRAVENOUS | Status: AC
  Filled 2022-10-27: qty 10

## 2022-10-27 MED ORDER — FENTANYL CITRATE PF 50 MCG/ML IJ SOSY: 25.0000 ug | PREFILLED_SYRINGE | INTRAMUSCULAR | Status: DC | PRN

## 2022-10-27 MED ORDER — BUPROPION HCL ER (XL) 150 MG PO TB24: 300.0000 mg | ORAL_TABLET | Freq: Every day | ORAL | Status: DC

## 2022-10-27 MED ORDER — OXYCODONE HCL 5 MG PO TABS
5.0000 mg | ORAL_TABLET | Freq: Once | ORAL | Status: AC | PRN
  Administered 2022-10-27: 5 mg via ORAL

## 2022-10-27 MED ORDER — ACETAMINOPHEN 160 MG/5ML PO SOLN: 1000.0000 mg | Freq: Four times a day (QID) | ORAL | Status: DC

## 2022-10-27 MED ORDER — BUPIVACAINE HCL (PF) 0.5 % IJ SOLN
INTRAMUSCULAR | Status: AC
  Filled 2022-10-27: qty 30

## 2022-10-27 MED ORDER — HEMOSTATIC AGENTS (NO CHARGE) OPTIME
TOPICAL | Status: DC | PRN
  Administered 2022-10-27: 1 via TOPICAL

## 2022-10-27 MED ORDER — ENOXAPARIN SODIUM 40 MG/0.4ML IJ SOSY
40.0000 mg | PREFILLED_SYRINGE | INTRAMUSCULAR | Status: DC
  Administered 2022-10-27 – 2022-10-29 (×3): 40 mg via SUBCUTANEOUS
  Filled 2022-10-27 (×3): qty 0.4

## 2022-10-27 MED ORDER — FENTANYL CITRATE (PF) 100 MCG/2ML IJ SOLN
25.0000 ug | INTRAMUSCULAR | Status: DC | PRN
  Administered 2022-10-27 (×2): 50 ug via INTRAVENOUS

## 2022-10-27 MED ORDER — METOPROLOL TARTRATE 50 MG PO TABS
100.0000 mg | ORAL_TABLET | Freq: Two times a day (BID) | ORAL | Status: DC
  Administered 2022-10-28 – 2022-10-30 (×5): 100 mg via ORAL
  Filled 2022-10-27 (×5): qty 2

## 2022-10-27 MED ORDER — PHENYLEPHRINE 80 MCG/ML (10ML) SYRINGE FOR IV PUSH (FOR BLOOD PRESSURE SUPPORT)
PREFILLED_SYRINGE | INTRAVENOUS | Status: DC | PRN
  Administered 2022-10-27 (×2): 80 ug via INTRAVENOUS

## 2022-10-27 MED ORDER — GABAPENTIN 100 MG PO CAPS
200.0000 mg | ORAL_CAPSULE | Freq: Every day | ORAL | Status: DC
  Administered 2022-10-27 – 2022-10-29 (×3): 200 mg via ORAL
  Filled 2022-10-27 (×3): qty 2

## 2022-10-27 MED ORDER — ACETAMINOPHEN 10 MG/ML IV SOLN
1000.0000 mg | Freq: Once | INTRAVENOUS | Status: DC | PRN
  Administered 2022-10-27: 1000 mg via INTRAVENOUS

## 2022-10-27 MED ORDER — MIDAZOLAM HCL 2 MG/2ML IJ SOLN
INTRAMUSCULAR | Status: AC
  Filled 2022-10-27: qty 2

## 2022-10-27 MED ORDER — BISACODYL 5 MG PO TBEC
10.0000 mg | DELAYED_RELEASE_TABLET | Freq: Every day | ORAL | Status: DC
  Administered 2022-10-27 – 2022-10-28 (×2): 10 mg via ORAL
  Filled 2022-10-27 (×3): qty 2

## 2022-10-27 MED ORDER — FENTANYL CITRATE (PF) 100 MCG/2ML IJ SOLN: 25.0000 ug | INTRAMUSCULAR | Status: DC | PRN

## 2022-10-27 MED ORDER — PROMETHAZINE HCL 25 MG/ML IJ SOLN: 6.2500 mg | INTRAMUSCULAR | Status: DC | PRN

## 2022-10-27 MED ORDER — SENNOSIDES-DOCUSATE SODIUM 8.6-50 MG PO TABS
1.0000 | ORAL_TABLET | Freq: Every day | ORAL | Status: DC
  Administered 2022-10-27 – 2022-10-29 (×3): 1 via ORAL
  Filled 2022-10-27 (×3): qty 1

## 2022-10-27 MED ORDER — DAPAGLIFLOZIN PROPANEDIOL 10 MG PO TABS
10.0000 mg | ORAL_TABLET | Freq: Every day | ORAL | Status: DC
  Administered 2022-10-29 – 2022-10-30 (×2): 10 mg via ORAL
  Filled 2022-10-27 (×3): qty 1

## 2022-10-27 MED ORDER — TEMAZEPAM 15 MG PO CAPS: 30.0000 mg | ORAL_CAPSULE | Freq: Every evening | ORAL | Status: DC | PRN

## 2022-10-27 MED ORDER — ACETAMINOPHEN 10 MG/ML IV SOLN
INTRAVENOUS | Status: AC
  Filled 2022-10-27: qty 100

## 2022-10-27 MED ORDER — BUDESON-GLYCOPYRROL-FORMOTEROL 160-9-4.8 MCG/ACT IN AERO: 2.0000 | INHALATION_SPRAY | Freq: Two times a day (BID) | RESPIRATORY_TRACT | Status: DC

## 2022-10-27 MED ORDER — DIPHENHYDRAMINE HCL 25 MG PO CAPS: 25.0000 mg | ORAL_CAPSULE | Freq: Four times a day (QID) | ORAL | Status: DC | PRN

## 2022-10-27 MED ORDER — SODIUM CHLORIDE 0.9 % IR SOLN
Status: DC | PRN
  Administered 2022-10-27: 1000 mL

## 2022-10-27 MED ORDER — ACETAMINOPHEN 500 MG PO TABS
1000.0000 mg | ORAL_TABLET | Freq: Four times a day (QID) | ORAL | Status: DC
  Administered 2022-10-27 – 2022-10-30 (×9): 1000 mg via ORAL
  Filled 2022-10-27 (×10): qty 2

## 2022-10-27 MED ORDER — EZETIMIBE 10 MG PO TABS
10.0000 mg | ORAL_TABLET | Freq: Every day | ORAL | Status: DC
  Administered 2022-10-27 – 2022-10-30 (×4): 10 mg via ORAL
  Filled 2022-10-27 (×4): qty 1

## 2022-10-27 MED ORDER — LACTATED RINGERS IV SOLN: INTRAVENOUS | Status: DC

## 2022-10-27 MED ORDER — OXYCODONE HCL 5 MG PO TABS
5.0000 mg | ORAL_TABLET | ORAL | Status: DC | PRN
  Administered 2022-10-28 – 2022-10-30 (×7): 10 mg via ORAL
  Filled 2022-10-27 (×7): qty 2

## 2022-10-27 MED ORDER — PROPOFOL 500 MG/50ML IV EMUL
INTRAVENOUS | Status: DC | PRN
  Administered 2022-10-27: 50 ug/kg/min via INTRAVENOUS

## 2022-10-27 MED ORDER — ONDANSETRON HCL 4 MG/2ML IJ SOLN: 4.0000 mg | Freq: Four times a day (QID) | INTRAMUSCULAR | Status: DC | PRN

## 2022-10-27 MED ORDER — AMISULPRIDE (ANTIEMETIC) 5 MG/2ML IV SOLN: 10.0000 mg | Freq: Once | INTRAVENOUS | Status: DC | PRN

## 2022-10-27 MED ORDER — ROCURONIUM BROMIDE 10 MG/ML (PF) SYRINGE
PREFILLED_SYRINGE | INTRAVENOUS | Status: DC | PRN
  Administered 2022-10-27: 20 mg via INTRAVENOUS
  Administered 2022-10-27: 80 mg via INTRAVENOUS
  Administered 2022-10-27: 20 mg via INTRAVENOUS
  Administered 2022-10-27: 30 mg via INTRAVENOUS

## 2022-10-27 MED ORDER — SODIUM CHLORIDE 0.9 % IV SOLN: INTRAVENOUS | Status: DC

## 2022-10-27 MED ORDER — FENTANYL CITRATE (PF) 100 MCG/2ML IJ SOLN
INTRAMUSCULAR | Status: AC
  Filled 2022-10-27: qty 2

## 2022-10-27 MED ORDER — ATORVASTATIN CALCIUM 40 MG PO TABS
40.0000 mg | ORAL_TABLET | Freq: Every day | ORAL | Status: DC
  Administered 2022-10-28 – 2022-10-30 (×3): 40 mg via ORAL
  Filled 2022-10-27 (×3): qty 1

## 2022-10-27 MED ORDER — ALBUTEROL SULFATE HFA 108 (90 BASE) MCG/ACT IN AERS
INHALATION_SPRAY | RESPIRATORY_TRACT | Status: DC | PRN
  Administered 2022-10-27: 2 via RESPIRATORY_TRACT

## 2022-10-27 MED ORDER — FENTANYL CITRATE (PF) 250 MCG/5ML IJ SOLN
INTRAMUSCULAR | Status: DC | PRN
  Administered 2022-10-27 (×4): 50 ug via INTRAVENOUS

## 2022-10-27 MED ORDER — MELATONIN 5 MG PO TABS
20.0000 mg | ORAL_TABLET | Freq: Every day | ORAL | Status: DC
  Administered 2022-10-27 – 2022-10-29 (×3): 20 mg via ORAL
  Filled 2022-10-27 (×5): qty 4

## 2022-10-27 MED ORDER — DULOXETINE HCL 30 MG PO CPEP: 30.0000 mg | ORAL_CAPSULE | Freq: Every day | ORAL | Status: DC

## 2022-10-27 MED ORDER — INSULIN REGULAR(HUMAN) IN NACL 100-0.9 UT/100ML-% IV SOLN
INTRAVENOUS | Status: DC | PRN
  Administered 2022-10-27: 6 [IU]/h via INTRAVENOUS

## 2022-10-27 MED ORDER — GABAPENTIN 300 MG PO CAPS
300.0000 mg | ORAL_CAPSULE | Freq: Two times a day (BID) | ORAL | Status: DC
  Administered 2022-10-28 – 2022-10-30 (×5): 300 mg via ORAL
  Filled 2022-10-27 (×5): qty 1

## 2022-10-27 MED ORDER — KETOROLAC TROMETHAMINE 15 MG/ML IJ SOLN
15.0000 mg | Freq: Four times a day (QID) | INTRAMUSCULAR | Status: AC
  Administered 2022-10-27 – 2022-10-29 (×8): 15 mg via INTRAVENOUS
  Filled 2022-10-27 (×8): qty 1

## 2022-10-27 MED ORDER — EPHEDRINE SULFATE-NACL 50-0.9 MG/10ML-% IV SOSY
PREFILLED_SYRINGE | INTRAVENOUS | Status: DC | PRN
  Administered 2022-10-27: 10 mg via INTRAVENOUS
  Administered 2022-10-27: 5 mg via INTRAVENOUS

## 2022-10-27 SURGICAL SUPPLY — 89 items
ADH SKN CLS APL DERMABOND .7 (GAUZE/BANDAGES/DRESSINGS) ×1
APPLIER CLIP ROT 10 11.4 M/L (STAPLE)
APR CLP MED LRG 11.4X10 (STAPLE)
BAG SPEC RTRVL C125 8X14 (MISCELLANEOUS) ×1
BLADE CLIPPER SURG (BLADE) ×1 IMPLANT
CANISTER SUCT 3000ML PPV (MISCELLANEOUS) ×2 IMPLANT
CANNULA REDUCER 12-8 DVNC XI (CANNULA) ×2 IMPLANT
CLIP APPLIE ROT 10 11.4 M/L (STAPLE) IMPLANT
CLIP TI MEDIUM 6 (CLIP) IMPLANT
CNTNR URN SCR LID CUP LEK RST (MISCELLANEOUS) ×5 IMPLANT
CONN ST 1/4X3/8 BEN (MISCELLANEOUS) IMPLANT
CONT SPEC 4OZ STRL OR WHT (MISCELLANEOUS) ×18
DEFOGGER SCOPE WARMER CLEARIFY (MISCELLANEOUS) ×1 IMPLANT
DERMABOND ADVANCED .7 DNX12 (GAUZE/BANDAGES/DRESSINGS) ×1 IMPLANT
DRAIN CHANNEL 28F RND 3/8 FF (WOUND CARE) IMPLANT
DRAIN CHANNEL 32F RND 10.7 FF (WOUND CARE) IMPLANT
DRAPE ARM DVNC X/XI (DISPOSABLE) ×4 IMPLANT
DRAPE COLUMN DVNC XI (DISPOSABLE) ×1 IMPLANT
DRAPE CV SPLIT W-CLR ANES SCRN (DRAPES) ×1 IMPLANT
DRAPE HALF SHEET 40X57 (DRAPES) ×1 IMPLANT
DRAPE INCISE IOBAN 66X45 STRL (DRAPES) IMPLANT
DRAPE ORTHO SPLIT 77X108 STRL (DRAPES) ×1
DRAPE SURG ORHT 6 SPLT 77X108 (DRAPES) ×1 IMPLANT
ELECT BLADE 6.5 EXT (BLADE) IMPLANT
ELECT REM PT RETURN 9FT ADLT (ELECTROSURGICAL) ×1
ELECTRODE REM PT RTRN 9FT ADLT (ELECTROSURGICAL) ×1 IMPLANT
FORCEPS BPLR FENES DVNC XI (FORCEP) IMPLANT
FORCEPS BPLR LNG DVNC XI (INSTRUMENTS) IMPLANT
GAUZE KITTNER 4X5 RF (MISCELLANEOUS) IMPLANT
GAUZE SPONGE 4X4 12PLY STRL (GAUZE/BANDAGES/DRESSINGS) ×1 IMPLANT
GLOVE SS BIOGEL STRL SZ 7.5 (GLOVE) ×1 IMPLANT
GOWN STRL REUS W/ TWL LRG LVL3 (GOWN DISPOSABLE) ×2 IMPLANT
GOWN STRL REUS W/ TWL XL LVL3 (GOWN DISPOSABLE) ×2 IMPLANT
GOWN STRL REUS W/TWL 2XL LVL3 (GOWN DISPOSABLE) ×1 IMPLANT
GOWN STRL REUS W/TWL LRG LVL3 (GOWN DISPOSABLE) ×2
GOWN STRL REUS W/TWL XL LVL3 (GOWN DISPOSABLE) ×2
GRASPER TIP-UP FEN DVNC XI (INSTRUMENTS) IMPLANT
HEMOSTAT SURGICEL 2X14 (HEMOSTASIS) ×4 IMPLANT
IRRIGATION STRYKERFLOW (MISCELLANEOUS) ×1 IMPLANT
IRRIGATOR STRYKERFLOW (MISCELLANEOUS) ×1
KIT BASIN OR (CUSTOM PROCEDURE TRAY) ×1 IMPLANT
NDL HYPO 25GX1X1/2 BEV (NEEDLE) ×1 IMPLANT
NEEDLE HYPO 25GX1X1/2 BEV (NEEDLE) ×1 IMPLANT
NS IRRIG 1000ML POUR BTL (IV SOLUTION) ×1 IMPLANT
PACK CHEST (CUSTOM PROCEDURE TRAY) ×1 IMPLANT
PAD ARMBOARD 7.5X6 YLW CONV (MISCELLANEOUS) ×2 IMPLANT
RELOAD STAPLE 45 2.0 GRY DVNC (STAPLE) IMPLANT
RELOAD STAPLE 45 2.5 WHT DVNC (STAPLE) IMPLANT
RELOAD STAPLE 45 3.5 BLU DVNC (STAPLE) IMPLANT
RELOAD STAPLE 45 4.3 GRN DVNC (STAPLE) IMPLANT
RELOAD STAPLER 2.5X45 WHT DVNC (STAPLE) ×3 IMPLANT
RELOAD STAPLER 3.5X45 BLU DVNC (STAPLE) ×5 IMPLANT
RELOAD STAPLER 4.3X45 GRN DVNC (STAPLE) ×3 IMPLANT
SCISSORS LAP 5X35 DISP (ENDOMECHANICALS) IMPLANT
SEAL UNIV 5-12 XI (MISCELLANEOUS) ×4 IMPLANT
SET TRI-LUMEN FLTR TB AIRSEAL (TUBING) ×1 IMPLANT
SHEARS HARMONIC HDI 20CM (ELECTROSURGICAL) IMPLANT
SOL ELECTROSURG ANTI STICK (MISCELLANEOUS) ×1
SOLUTION ELECTROSURG ANTI STCK (MISCELLANEOUS) ×1 IMPLANT
SPONGE INTESTINAL PEANUT (DISPOSABLE) IMPLANT
SPONGE TONSIL 1 RF SGL (DISPOSABLE) IMPLANT
STAPLE RELOAD 45 2.0 GRAY DVNC (STAPLE) ×1 IMPLANT
STAPLER 45 SUREFORM CVD DVNC (STAPLE) IMPLANT
STAPLER 45 SUREFORM DVNC (STAPLE) IMPLANT
STAPLER RELOAD 2.5X45 WHT DVNC (STAPLE) ×3
STAPLER RELOAD 3.5X45 BLU DVNC (STAPLE) ×5
STAPLER RELOAD 4.3X45 GRN DVNC (STAPLE) ×3
SUT PDS AB 3-0 SH 27 (SUTURE) IMPLANT
SUT PROLENE 4 0 RB 1 (SUTURE)
SUT PROLENE 4-0 RB1 .5 CRCL 36 (SUTURE) IMPLANT
SUT SILK 1 MH (SUTURE) ×2 IMPLANT
SUT SILK 2 0 SH (SUTURE) IMPLANT
SUT SILK 2 0SH CR/8 30 (SUTURE) IMPLANT
SUT SILK 3 0SH CR/8 30 (SUTURE) IMPLANT
SUT VIC AB 1 CTX 36 (SUTURE)
SUT VIC AB 1 CTX36XBRD ANBCTR (SUTURE) IMPLANT
SUT VIC AB 2-0 CTX 36 (SUTURE) IMPLANT
SUT VIC AB 3-0 X1 27 (SUTURE) ×1 IMPLANT
SUT VICRYL 0 TIES 12 18 (SUTURE) ×1 IMPLANT
SUT VICRYL 0 UR6 27IN ABS (SUTURE) ×2 IMPLANT
SUT VICRYL 2 TP 1 (SUTURE) IMPLANT
SYR 20CC LL (SYRINGE) ×2 IMPLANT
SYSTEM RETRIEVAL ANCHOR 8 (MISCELLANEOUS) IMPLANT
SYSTEM SAHARA CHEST DRAIN ATS (WOUND CARE) ×1 IMPLANT
TAPE CLOTH 4X10 WHT NS (GAUZE/BANDAGES/DRESSINGS) ×1 IMPLANT
TIP APPLICATOR SPRAY EXTEND 16 (VASCULAR PRODUCTS) IMPLANT
TOWEL GREEN STERILE (TOWEL DISPOSABLE) ×1 IMPLANT
TRAY FOLEY MTR SLVR 16FR STAT (SET/KITS/TRAYS/PACK) ×1 IMPLANT
WATER STERILE IRR 1000ML POUR (IV SOLUTION) ×1 IMPLANT

## 2022-10-27 NOTE — Discharge Summary (Incomplete)
[address]       [place] [address]             [phone]    Physician Discharge Summary  Patient ID: Raymond [name] MRN: [id number] DOB/AGE: Oct 12, 1965 57 y.o.  Admit date: 10/27/2022 Discharge date: 10/30/2022  Admission Diagnoses:  Patient Active Problem List   Diagnosis Date Noted   Lung nodule 10/27/2022   Aortic atherosclerosis (HCC) 12/07/2021   Abnormal LFTs 04/22/2021   Insomnia due to medical condition 12/13/2020   At risk for prolonged QT interval syndrome 12/13/2020   Hypersomnia with sleep apnea 12/13/2020   Mixed hyperlipidemia 09/26/2020   Pulmonary emphysema (HCC) 09/26/2020   Karim Aiello's esophagus without dysplasia    Gastric erythema    Special screening for malignant neoplasms, colon    Polyp of colon    MDD (major depressive disorder), recurrent episode, mild (HCC) 06/23/2020   Noncompliance with diabetes treatment 04/12/2020   Angina pectoris (HCC) 04/12/2020   MDD (major depressive disorder), recurrent, in full remission (HCC) 01/05/2020   MDD (major depressive disorder), recurrent, in partial remission (HCC) 10/30/2019   Tobacco use disorder 07/02/2019   Right upper quadrant pain    Encounter for other preprocedural examination 01/20/2019   PTSD (post-traumatic stress disorder) 01/20/2019   MDD (major depressive disorder), recurrent episode, moderate (HCC) 01/20/2019   Insomnia due to mental disorder 01/20/2019   Calculus of gallbladder with acute on chronic cholecystitis without obstruction 11/15/2018   Generalized abdominal pain 10/08/2018   Local superficial swelling, mass or lump 10/08/2018   Enterocolitis 08/16/2018   Left upper quadrant abdominal pain of unknown etiology 08/16/2018   Diastasis recti 06/19/2018   Anxiety disorder 04/01/2018   Clotted blood in stool 12/31/2017   Diarrhea 12/31/2017   Encounter for general adult medical examination with abnormal findings 10/27/2017   Uncontrolled type 2 diabetes  mellitus with hyperglycemia (HCC) 10/27/2017   Onychomycosis with ingrown toenail 10/27/2017   Dysuria 10/27/2017   Uncontrolled type 2 diabetes mellitus with hypoglycemia without coma (HCC) 07/08/2017   Shortness of breath 07/08/2017   Cigarette nicotine dependence without complication 07/08/2017   Influenza A 05/30/2017   OSA (obstructive sleep apnea) 08/12/2015   Cervical spondylosis 06/14/2015   Essential hypertension 04/08/2015   Depression 04/08/2015   Gastroesophageal reflux disease without esophagitis 01/05/2015   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     Discharge Diagnoses: 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,N0) Patient Active Problem List   Diagnosis Date Noted   S/P robot-assisted Video Assisted Thoracoscopy with Right Lower Lobe Superior Segmentectomy, Node Dissection, and Intercostal Nerve Block 10/28/2022   Lung nodule 10/27/2022   Right lower lobe pulmonary nodule 10/27/2022   Aortic atherosclerosis (HCC) 12/07/2021   Abnormal LFTs 04/22/2021   Insomnia due to medical condition 12/13/2020   At risk for prolonged QT interval syndrome 12/13/2020   Hypersomnia with sleep apnea 12/13/2020   Mixed hyperlipidemia 09/26/2020   Pulmonary emphysema (HCC) 09/26/2020   Raymond [name]'s esophagus without dysplasia    Gastric erythema    Special screening for malignant neoplasms, colon    Polyp of colon    MDD (major depressive disorder), recurrent episode, mild (HCC) 06/23/2020   Noncompliance with diabetes treatment 04/12/2020   Angina pectoris (HCC) 04/12/2020   MDD (major depressive disorder), recurrent, in full remission (HCC) 01/05/2020   MDD (major depressive disorder), recurrent, in partial remission (HCC) 10/30/2019   Tobacco use disorder 07/02/2019  Right upper quadrant pain    Encounter for other preprocedural examination 01/20/2019   PTSD (post-traumatic stress disorder) 01/20/2019   MDD (major  depressive disorder), recurrent episode, moderate (HCC) 01/20/2019   Insomnia due to mental disorder 01/20/2019   Calculus of gallbladder with acute on chronic cholecystitis without obstruction 11/15/2018   Generalized abdominal pain 10/08/2018   Local superficial swelling, mass or lump 10/08/2018   Enterocolitis 08/16/2018   Left upper quadrant abdominal pain of unknown etiology 08/16/2018   Diastasis recti 06/19/2018   Anxiety disorder 04/01/2018   Clotted blood in stool 12/31/2017   Diarrhea 12/31/2017   Encounter for general adult medical examination with abnormal findings 10/27/2017   Uncontrolled type 2 diabetes mellitus with hyperglycemia (HCC) 10/27/2017   Onychomycosis with ingrown toenail 10/27/2017   Dysuria 10/27/2017   Uncontrolled type 2 diabetes mellitus with hypoglycemia without coma (HCC) 07/08/2017   Shortness of breath 07/08/2017   Cigarette nicotine dependence without complication 07/08/2017   Influenza A 05/30/2017   OSA (obstructive sleep apnea) 08/12/2015   Cervical spondylosis 06/14/2015   Essential hypertension 04/08/2015   Depression 04/08/2015   Gastroesophageal reflux disease without esophagitis 01/05/2015   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  He has a 40-pack-year history of smoking (1 PPD x 40 years).  He continues to smoke.   He had a low-dose CT for lung cancer screening in early May.  It showed a new 12.5 mm spiculated nodule in the superior segment of the right lower lobe.  That led to a PET/CT which showed the nodule was hypermetabolic.  There was no evidence of regional or distant metastatic disease.   He has numerous complaints including shortness of breath  with exertion and claudication, both of which are limiting, but he can walk up a flight of stairs.  No chest pain, pressure, or tightness.  He has has had a loss of appetite and decreased energy.  Suffers from anxiety, depression, and PTSD.  Disabled from work.  Dr. Dorris Fetch discussed the need for robotic assisted right thoracoscopy, superior segmentectomy of right lower lobe, lymph node dissection, and intercostal nerve block. Potential risks, benefits, and complications of the surgery were discussed with the patient and he agreed to proceed with surgery.   Hospital Course: Patient underwent a Xi robotic assisted right thoracoscopy, RLL superior segmentectomy of RLL, LN dissection,  and intercostal nerve block. He was extubated and transferred from the OR to PACU in stale condition.  The patient's CT was free from air leak.  Post operative output was around 300 cc.  CXR showed atelectasis. His central line and IV fluids were discontinued on POD #1.  His blood sugars were elevated and his home diabetic medications were resumed as indicated. His CXR remained stable and his chest tube was removed on 10/29/2022.  Follow up CXR showed a tiny right apical space, he remained saturating well on room air. His surgical incisions were healing without evidence of infection. He was ambulating without difficulty. He was felt stable for discharge home.  Consults: None  Significant Diagnostic Studies:  CLINICAL DATA:  Initial treatment strategy for right lower lobe lung mass.   EXAM: NUCLEAR MEDICINE PET SKULL BASE TO THIGH   TECHNIQUE: 12.8 mCi F-18 FDG was injected  intravenously. Full-ring PET imaging was performed from the skull base to thigh after the radiotracer. CT data was obtained and used for attenuation correction and anatomic localization.   Fasting blood glucose: 96 mg/dl   COMPARISON:  CT chest dated 08/31/2022. CT abdomen/pelvis dated 08/23/2022.   FINDINGS: Mediastinal blood pool  activity: SUV max 2.5   Liver activity: SUV max NA   NECK: No hypermetabolic cervical lymphadenopathy.   Incidental CT findings: None.   CHEST: 15 mm bilobed nodule in the superior segment right lower lobe (series 4/image 52), max SUV 4.0.   No hypermetabolic thoracic lymphadenopathy.   Incidental CT findings: Mild atherosclerotic calcifications of the aortic arch. Mild coronary atherosclerosis of the LAD.   ABDOMEN/PELVIS: No abnormal hypermetabolism in the liver, spleen, pancreas, or adrenal glands.   No hypermetabolic abdominopelvic lymphadenopathy.   Incidental CT findings: Status post cholecystectomy. Atherosclerotic calcifications of the abdominal aorta and branch vessels. 3.1 cm left upper pole renal cyst, benign (Bosniak I).   SKELETON: No focal hypermetabolic activity to suggest skeletal metastasis.   Mild hypermetabolism along the right aspect of the T7 vertebral body, max SUV 5.0, degenerative.   Incidental CT findings: Degenerative changes of the visualized thoracolumbar spine. Status post ORIF of the right clavicle.   IMPRESSION: 15 mm bilobed nodule in the superior segment right lower lobe demonstrates mild hypermetabolism. While infectious/inflammatory etiologies are technically possible, this appearance is considered suspicious for primary bronchogenic carcinoma such as semi-invasive adenocarcinoma.   No evidence of metastatic disease.   Electronically Signed   By: Charline Bills M.D.   On: 09/25/2022 01:43   CLINICAL DATA:  Chest tube removal.   EXAM: CHEST - 2 VIEW   COMPARISON:  10/29/2022 and older.   FINDINGS: Stable postoperative changes in the right lower lung. Tiny right apical pneumothorax. Similar subcutaneous emphysema along the right lower chest wall. Stable cardiac and mediastinal contours.   IMPRESSION: Tiny right apical pneumothorax, likely residual postoperative.     Electronically Signed   By: Orvan Falconer M.D.    On: 10/30/2022 07:39  Treatments: surgery:   Operative Report    DATE OF PROCEDURE: 10/27/2022   PREOPERATIVE DIAGNOSIS:  Right lower lobe lung nodule.   POSTOPERATIVE DIAGNOSIS:  Adenocarcinoma, right lower lobe, clinical stage IA (T1, N0).   PROCEDURE:  Xi robotic-assisted right lower lobe superior segmentectomy, lymph node dissection, intercostal nerve blocks levels 4 through 10.   SURGEON:  Salvatore Decent. Dorris Fetch, MD   Discharge Exam: Blood pressure 135/71, pulse 78, temperature 98.4 F (36.9 C), temperature source Oral, resp. rate 18, height 5\' 9"  (1.753 m), weight 109.7 kg, SpO2 98 %. General appearance: alert, cooperative, and no distress Neurologic: intact Heart: regular rate and rhythm, S1, S2 normal, no murmur, click, rub or gallop Lungs: clear to auscultation bilaterally Abdomen: soft, non-tender; bowel sounds normal; no masses,  no organomegaly Extremities: extremities normal, atraumatic, no cyanosis or edema Wound: Clean and dry, no erythema or sign of infection  Discharge Medications:       Allergies as of 10/30/2022       Reactions   Onion Anaphylaxis   Norco [hydrocodone-acetaminophen] Itching   Nickel Rash   Nicoderm [nicotine] Rash        Medication List     TAKE these medications    Accu-Chek Aviva Plus test strip Generic drug: glucose blood Blood sugar testing TID and as needed. E11.65   Accu-Chek FastClix Lancets Misc yse as directed twice a day   acetaminophen 500 MG  tablet Commonly known as: TYLENOL Take 2 tablets (1,000 mg total) by mouth every 6 (six) hours as needed.   albuterol 108 (90 Base) MCG/ACT inhaler Commonly known as: VENTOLIN HFA INHALE 2 PUFFS INTO THE LUNGS EVERY 6 HOURS AS MEEDED FOR WHEEZING OR SHORTNESS OF BREATH   aspirin EC 81 MG tablet Take 81 mg by mouth daily at 3 pm.   atorvastatin 40 MG tablet Commonly known as: LIPITOR Take 1 tablet (40 mg total) by mouth daily.   Breztri Aerosphere 160-9-4.8 MCG/ACT  Aero Generic drug: Budeson-Glycopyrrol-Formoterol INHALE 2 PUFFS TWICE DAILY   buPROPion 300 MG 24 hr tablet Commonly known as: Wellbutrin XL Take 1 tablet (300 mg total) by mouth daily. Take along with 150 mg daily   buPROPion 150 MG 24 hr tablet Commonly known as: Wellbutrin XL Take 1 tablet (150 mg total) by mouth daily. Take along with 300 mg daily   cyclobenzaprine 10 MG tablet Commonly known as: FLEXERIL Take 1 tablet (10 mg total) by mouth at bedtime as needed for muscle spasms.   dapagliflozin propanediol 10 MG Tabs tablet Commonly known as: Farxiga Take 1 tablet (10 mg total) by mouth daily before breakfast.   DULoxetine 30 MG capsule Commonly known as: Cymbalta Take 1 capsule (30 mg total) by mouth daily. Take along with 60 mg daily, total of 90 mg daily   DULoxetine 60 MG capsule Commonly known as: CYMBALTA TAKE 1 CAPSULE BY MOUTH ONCE DAILY (TAKE  ALONG  WITH  30MG   DAILY)   ezetimibe 10 MG tablet Commonly known as: ZETIA Take 1 tablet (10 mg total) by mouth daily.   famotidine 20 MG tablet Commonly known as: PEPCID Take 1 tablet (20 mg total) by mouth 2 (two) times daily.   FreeStyle Libre 3 Sensor Misc Apply 1 sensor to the skin every 14 days   gabapentin 100 MG capsule Commonly known as: NEURONTIN Take 200 mg by mouth at bedtime.   gabapentin 600 MG tablet Commonly known as: NEURONTIN Take 0.5 tablets (300 mg total) by mouth 2 (two) times daily. May increase to 1 whole tablet in PM after 1 week.   Gemtesa 75 MG Tabs Generic drug: Vibegron Take 1 tablet (75 mg total) by mouth daily.   Insulin Pen Needle 32G X 4 MM Misc Use with daily dose insulin and with sliding scale insulin as needed.   Melatonin 10 MG Caps Take 20 mg by mouth at bedtime.   metoprolol tartrate 100 MG tablet Commonly known as: LOPRESSOR Take 1 tablet (100 mg total) by mouth 2 (two) times daily.   nitroGLYCERIN 0.4 MG SL tablet Commonly known as: NITROSTAT Place 1 tablet  (0.4 mg total) under the tongue every 5 (five) minutes x 3 doses as needed for chest pain.   oxyCODONE 5 MG immediate release tablet Commonly known as: Oxy IR/ROXICODONE Take 1 tablet (5 mg total) by mouth every 6 (six) hours as needed for moderate pain.   pantoprazole 40 MG tablet Commonly known as: PROTONIX Take 1 tablet (40 mg total) by mouth daily.   Semaglutide 7 MG Tabs Take 1 tablet (7 mg total) by mouth daily before breakfast. On empty stomach with no more than 4 oz of water, wait 30 min before eating or drinking.   tamsulosin 0.4 MG Caps capsule Commonly known as: FLOMAX Take 1 capsule (0.4 mg total) by mouth daily.   temazepam 30 MG capsule Commonly known as: RESTORIL TAKE 1 CAPSULE BY MOUTH AT BEDTIME AS NEEDED FOR  SLEEP What changed: See the new instructions.   Toujeo Max SoloStar 300 UNIT/ML Solostar Pen Generic drug: insulin glargine (2 Unit Dial) Inject 70 Units into the skin daily.   Varenicline Tartrate (Starter) 0.5 MG X 11 & 1 MG X 42 Tbpk Take 0.5 mg by mouth once daily on days 1-3, then take 0.5 mg twice daily on days 4-7, then increase to 1 mg twice daily        Follow-up Information     Loreli Slot, MD Follow up on 11/22/2022.   Specialty: Cardiothoracic Surgery Why: Appointment is at 4:00.. Please get CXR 1 hour prior to your appointment at [facility] information: [address] [place] [address] [phone]         [facility]. Go on 11/22/2022.   Why: Please arrive by 3:00 in order to have a PA/LAT CXR taken PRIOR to office appointment with Dr. Joneen Roach information: [address] [place] [address]        [facility] Follow up on 11/09/2022.   Specialty: Cardiothoracic Surgery Why: Appointment is at 10:00, For suture removal Contact information: [address] [place], [address] [place]  [address] [phone]                Signed:  Jenny Reichmann, PA-C 10/30/2022, 1:17 PM

## 2022-10-27 NOTE — Anesthesia Procedure Notes (Signed)
Central Venous Catheter Insertion Performed by: Shelton Silvas, MD, anesthesiologist Start/End7/08/2022 7:10 AM, 10/27/2022 7:20 AM Patient location: Pre-op. Preanesthetic checklist: patient identified, IV checked, site marked, risks and benefits discussed, surgical consent, monitors and equipment checked, pre-op evaluation, timeout performed and anesthesia consent Position: Trendelenburg Lidocaine 1% used for infiltration and patient sedated Hand hygiene performed , maximum sterile barriers used  and Seldinger technique used Catheter size: 8 Fr Total catheter length 16. Central line was placed.Double lumen Procedure performed using ultrasound guided technique. Ultrasound Notes:anatomy identified, needle tip was noted to be adjacent to the nerve/plexus identified, no ultrasound evidence of intravascular and/or intraneural injection and image(s) printed for medical record Attempts: 1 Following insertion, dressing applied, line sutured and Biopatch. Post procedure assessment: blood return through all ports  Patient tolerated the procedure well with no immediate complications.

## 2022-10-27 NOTE — Anesthesia Postprocedure Evaluation (Signed)
Anesthesia Post Note  Patient: Raymond [name]  Procedure(s) Performed: XI ROBOTIC ASSISTED THORACOSCOPY-RIGHT LOWER LOBE SUPERIOR SEGMENTECTOMY (Right: Chest) LYMPH NODE DISSECTION (Right: Chest)     Patient location during evaluation: PACU Anesthesia Type: General Level of consciousness: awake and alert Pain management: pain level controlled Vital Signs Assessment: post-procedure vital signs reviewed and stable Respiratory status: spontaneous breathing, nonlabored ventilation, respiratory function stable and patient connected to nasal cannula oxygen Cardiovascular status: blood pressure returned to baseline and stable Postop Assessment: no apparent nausea or vomiting Anesthetic complications: no  No notable events documented.  Last Vitals:  Vitals:   10/27/22 1415 10/27/22 1445  BP: 110/71   Pulse: 83 82  Resp: 14 13  Temp:    SpO2: 91% 92%    Last Pain:  Vitals:   10/27/22 1415  TempSrc:   PainSc: 0-No pain                 Shelton Silvas

## 2022-10-27 NOTE — Brief Op Note (Addendum)
10/27/2022  10:59 AM  PATIENT:  Raymond [name]  57 y.o. male  PRE-OPERATIVE DIAGNOSIS:  Right Lower Lobe LUNG NODULE  POST-OPERATIVE DIAGNOSIS: Adenocarcinoma right lower lobe- Clincal stage IA (T1N0)  PROCEDURE:   XI ROBOTIC ASSISTED RIGHT THORACOSCOPY,  RIGHT LOWER LOBE SUPERIOR SEGMENTECTOMY , LYMPH NODE DISSECTION, and  INTERCOSTAL NERVE BLOCK  SURGEON:  Surgeon(s) and Role:    Loreli Slot, MD - Primary  PHYSICIAN ASSISTANT: Doree Fudge PA-C  ANESTHESIA:   general  EBL:  50 mL   BLOOD ADMINISTERED:none  DRAINS:  28 Blake drain placed in the right pleural spaces    LOCAL MEDICATIONS USED:  OTHER Exparel  SPECIMEN:  Source of Specimen:  Multiple lymph nodes, superior segmentectomy RLL . 1 lymph node frozen positive for cancer  DISPOSITION OF SPECIMEN:  PATHOLOGY  COUNTS CORRECT:  YES  DICTATION: done  PLAN OF CARE: Admit to inpatient   PATIENT DISPOSITION:  PACU - hemodynamically stable.   Delay start of Pharmacological VTE agent (>24hrs) due to surgical blood loss or risk of bleeding: no

## 2022-10-27 NOTE — Anesthesia Procedure Notes (Signed)
Arterial Line Insertion Start/End7/08/2022 7:05 AM, 10/27/2022 7:10 AM Performed by: Darryl Nestle, CRNA, CRNA  Patient location: Pre-op. Preanesthetic checklist: patient identified, IV checked, site marked, risks and benefits discussed, surgical consent, monitors and equipment checked, pre-op evaluation, timeout performed and anesthesia consent Lidocaine 1% used for infiltration Left, radial was placed Catheter size: 20 G Hand hygiene performed  and maximum sterile barriers used   Attempts: 1 Procedure performed without using ultrasound guided technique. Following insertion, dressing applied and Biopatch. Post procedure assessment: normal and unchanged  Patient tolerated the procedure well with no immediate complications.

## 2022-10-27 NOTE — Anesthesia Procedure Notes (Signed)
Procedure Name: Intubation Date/Time: 10/27/2022 7:45 AM  Performed by: Darryl Nestle, CRNAPre-anesthesia Checklist: Patient identified, Emergency Drugs available, Suction available and Patient being monitored Patient Re-evaluated:Patient Re-evaluated prior to induction Oxygen Delivery Method: Circle system utilized Preoxygenation: Pre-oxygenation with 100% oxygen Induction Type: IV induction Ventilation: Mask ventilation without difficulty and Oral airway inserted - appropriate to patient size Laryngoscope Size: Mac and 3 Grade View: Grade I Tube type: Oral Endobronchial tube: Double lumen EBT, EBT position confirmed by fiberoptic bronchoscope and Left and 39 Fr Number of attempts: 1 Airway Equipment and Method: Stylet and Oral airway Placement Confirmation: ETT inserted through vocal cords under direct vision, positive ETCO2 and breath sounds checked- equal and bilateral Tube secured with: Tape Dental Injury: Teeth and Oropharynx as per pre-operative assessment

## 2022-10-27 NOTE — Discharge Instructions (Signed)
Robot-Assisted Thoracic Surgery, Care After The following information offers guidance on how to care for yourself after your procedure. Your health care provider may also give you more specific instructions. If you have problems or questions, contact your health care provider. What can I expect after the procedure? After the procedure, it is common to have: Some pain and aches in the area of your surgical incisions. Pain when breathing in (inhaling) and coughing. Tiredness (fatigue). Trouble sleeping. Constipation. Follow these instructions at home: Medicines Take over-the-counter and prescription medicines only as told by your health care provider. If you were prescribed an antibiotic medicine, take it as told by your health care provider. Do not stop taking the antibiotic even if you start to feel better. Talk with your health care provider about safe and effective ways to manage pain after your procedure. Pain management should fit your specific health needs. Take pain medicine before pain becomes severe. Relieving and controlling your pain will make breathing easier for you. Ask your health care provider if the medicine prescribed to you requires you to avoid driving or using machinery. Eating and drinking Follow instructions from your health care provider about eating or drinking restrictions. These will vary depending on what procedure you had. Your health care provider may recommend: A liquid diet or soft diet for the first few days. Meals that are smaller and more frequent. A diet of fruits, vegetables, whole grains, and low-fat proteins. Limiting foods that are high in fat and processed sugar, including fried or sweet foods. Incision care Follow instructions from your health care provider about how to take care of your incisions. Make sure you: Wash your hands with soap and water for at least 20 seconds before and after you change your bandage (dressing). If soap and water are not  available, use hand sanitizer. Change your dressing as told by your health care provider. Leave stitches (sutures), skin glue, or adhesive strips in place. These skin closures may need to stay in place for 2 weeks or longer. If adhesive strip edges start to loosen and curl up, you may trim the loose edges. Do not remove adhesive strips completely unless your health care provider tells you to do that. Check your incision area every day for signs of infection. Check for: Redness, swelling, or more pain. Fluid or blood. Warmth. Pus or a bad smell. Activity Return to your normal activities as told by your health care provider. Ask your health care provider what activities are safe for you. Ask your health care provider when it is safe for you to drive. Do not lift anything that is heavier than 10 lb (4.5 kg), or the limit that you are told, until your health care provider says that it is safe. Rest as told by your health care provider. Avoid sitting for a long time without moving. Get up to take short walks every 1-2 hours. This is important to improve blood flow and breathing. Ask for help if you feel weak or unsteady. Do exercises as told by your health care provider. Pneumonia prevention  Do deep breathing exercises and cough regularly as directed. This helps clear mucus and opens your lungs. Doing this helps prevent lung infection (pneumonia). If you were given an incentive spirometer, use it as told. An incentive spirometer is a tool that measures how well you are filling your lungs with each breath. Coughing may hurt less if you try to support your chest. This is called splinting. Try one of these when you   cough: Hold a pillow against your chest. Place the palms of both hands on top of your incision area. Do not use any products that contain nicotine or tobacco. These products include cigarettes, chewing tobacco, and vaping devices, such as e-cigarettes. If you need help quitting, ask your  health care provider. Avoid secondhand smoke. General instructions If you have a drainage tube: Follow instructions from your health care provider about how to take care of it. Do not travel by airplane after your tube is removed until your health care provider tells you it is safe. You may need to take these actions to prevent or treat constipation: Drink enough fluid to keep your urine pale yellow. Take over-the-counter or prescription medicines. Eat foods that are high in fiber, such as beans, whole grains, and fresh fruits and vegetables. Limit foods that are high in fat and processed sugars, such as fried or sweet foods. Keep all follow-up visits. This is important. Contact a health care provider if: You have redness, swelling, or more pain around an incision. You have fluid or blood coming from an incision. An incision feels warm to the touch. You have pus or a bad smell coming from an incision. You have a fever. You cannot eat or drink without vomiting. Your pain medicine is not controlling your pain. Get help right away if: You have chest pain. Your heart is beating quickly. You have trouble breathing. You have trouble speaking. You are confused. You feel weak or dizzy, or you faint. These symptoms may represent a serious problem that is an emergency. Do not wait to see if the symptoms will go away. Get medical help right away. Call your local emergency services (911 in the [place] Do not drive yourself to the hospital. Summary Talk with your health care provider about safe and effective ways to manage pain after your procedure. Pain management should fit your specific health needs. Return to your normal activities as told by your health care provider. Ask your health care provider what activities are safe for you. Do deep breathing exercises and cough regularly as directed. This helps to clear mucus and prevent pneumonia. If it hurts to cough, ease pain by holding a pillow  against your chest or by placing the palms of both hands over your incisions. This information is not intended to replace advice given to you by your health care provider. Make sure you discuss any questions you have with your health care provider. Document Revised: 01/02/2020 Document Reviewed: 01/02/2020 Elsevier Patient Education  2024 [employer] 

## 2022-10-27 NOTE — Transfer of Care (Signed)
Immediate Anesthesia Transfer of Care Note  Patient: Raymond [name]  Procedure(s) Performed: XI ROBOTIC ASSISTED THORACOSCOPY-RIGHT LOWER LOBE SUPERIOR SEGMENTECTOMY (Right: Chest) LYMPH NODE DISSECTION (Right: Chest)  Patient Location: PACU  Anesthesia Type:General  Level of Consciousness: awake, alert , and oriented  Airway & Oxygen Therapy: Patient Spontanous Breathing and Patient connected to face mask oxygen  Post-op Assessment: Report given to RN and Post -op Vital signs reviewed and stable  Post vital signs: Reviewed and stable  Last Vitals:  Vitals Value Taken Time  BP 115/70 10/27/22 1120  Temp 36.3 C 10/27/22 1105  Pulse 89 10/27/22 1127  Resp 9 10/27/22 1127  SpO2 94 % 10/27/22 1127  Vitals shown include unvalidated device data.  Last Pain:  Vitals:   10/27/22 1120  TempSrc:   PainSc: 7       Patients Stated Pain Goal: 3 (10/27/22 1120)  Complications: No notable events documented.

## 2022-10-27 NOTE — Op Note (Unsigned)
NAME: Raymond [name], Raymond [name] MEDICAL RECORD NO: [id number] ACCOUNT NO: [id number] DATE OF BIRTH: November 02, 1965 FACILITY: [facility] LOCATION: [facility] PHYSICIAN: Salvatore Decent. Dorris Fetch, MD  Operative Report   DATE OF PROCEDURE: 10/27/2022  PREOPERATIVE DIAGNOSIS:  Right lower lobe lung nodule.  POSTOPERATIVE DIAGNOSIS:  Adenocarcinoma, right lower lobe, clinical stage IA (T1, N0).  PROCEDURE:  Xi robotic-assisted right lower lobe superior segmentectomy, lymph node dissection, intercostal nerve blocks levels 4 through 10.  SURGEON:  Salvatore Decent. Dorris Fetch, MD  ASSISTANT:  Doree Fudge.  ANESTHESIA:  General.  FINDINGS:  Accessory fissure between the superior segment and remainder of the lower lobe.  Level 10 and 11 lymph nodes negative for tumor on frozen section, although there were atypical cells on the level 11 node, nodule was an adenocarcinoma, margin  clear.  CLINICAL NOTE:  The patient is a 57 year old man with a history of tobacco use who was found to have a lung nodule on a CT for lung cancer screening.  PET/CT showed the nodule was hypermetabolic.  He was offered the option of biopsy or proceeding  directly to surgery for definitive diagnosis and treatment.  The indications, risks, benefits, and alternatives were discussed in detail with the patient.  He understood and accepted the risks and agreed to proceed.  OPERATIVE NOTE:  The patient was brought to the preoperative holding area on 10/27/2022.  Anesthesia established, intravenous access and placed an arterial blood pressure monitoring line.  He was taken to the operating room and anesthetized and intubated  with a double lumen endotracheal tube.  Intravenous antibiotics were administered.  A Foley catheter was placed.  Sequential compression devices were placed on the calves for DVT prophylaxis.  He was placed in a left lateral decubitus position.  A Bair  Hugger was placed for active warming.  The right chest was prepped and  draped in the usual sterile fashion.  Single lung ventilation of the left lung was initiated and was tolerated well throughout the procedure.  A timeout was performed.  A solution containing 20 mL of liposomal bupivacaine, 30 mL of 0.5% bupivacaine and 50 mL of saline was prepared.  This solution was used for local at the incision sites as well as for the intercostal nerve blocks.  An incision was made in the eighth interspace in approximately the midaxillary line, an 8 mm robotic port was inserted.  The thoracoscope was advanced into the chest, after confirming intrapleural placement, carbon dioxide was insufflated per protocol.   A 12 mm port was placed in the seventh interspace anterior to the camera port.  Intercostal nerve blocks were performed from the fourth to the tenth interspace by injecting 10 mL of the bupivacaine solution into a subpleural plane at each level.  A 12 mm  AirSeal port was placed in the tenth interspace posterolaterally.  Two additional robotic ports were placed in the eighth interspace, the robot was deployed.  The camera arm was docked, targeting was performed.  The remaining arms were docked.  Robotic  instruments were inserted with thoracoscopic visualization.  The lower lobe was retracted superiorly while waiting for the lung to deflate. It should be noted that there was a fissure separating the superior segment from the remainder of the lower lobe.  The inferior ligament was divided using bipolar cautery.   Level 9 node was removed.  Lung was retracted anteriorly and the pleural reflection was divided at the hilum posteriorly. Level 8 and level 7 nodes were removed.  There were some minor bleeding  with removal of level 7 nodes and Surgicel was applied in  that area.  The space between the takeoff of the right upper lobe bronchus and bronchus intermedius was explored and a level 11 node was removed.  Further along the bronchus a level 10 node was removed.  The level 10 and  11 nodes were sent for frozen  section.  There were a few atypical cells seen on the level 11 node.  No tumor was seen on the level 10.  The fissure was inspected.  There was an unusual confluence of the fissures.  The pulmonary artery was identifiable in the major fissure between the middle lobe and the lower lobe.  The pleura overlying it was incised and a plane was developed  superficial to the pulmonary artery.  The fissure was divided using the robotic stapler.  The fissure was divided between the upper lobe and the superior segment and attention was turned to the artery.  The basilar segmental pulmonary artery was  identified as was the superior segmental branch. Nodes were dissected from around the superior segmental branch and it was encircled and divided with the robotic stapler.  Next, the pulmonary vein branch from the superior segment was encircled and  divided and finally additional nodes were dissected out. A level 13 nodes were removed and the superior segmental bronchus was encircled, stapler was placed across the bronchus and closed.  A test inflation showed good aeration of the remainder of the  lower lobe.  The stapler was fired transecting the bronchus.  The segmentectomy was completed with sequential firings of the robotic stapler along the fissure.  The specimen was placed into an 8 mm endoscopic retrieval bag, removed, and sent for frozen  section of the nodule and the bronchial margin.  The chest was copiously irrigated with saline.  A test inflation revealed no leakage from the bronchial stump or the fissures.  The robotic instruments were removed.  The robot was undocked.  The chest was  inspected.  All the port sites had good hemostasis.  The vessel loop and sponges that had been placed during the dissection were removed prior to undocking the robot. 2 other sponges could not be localized, those were removed after the robot was  undocked.  A 28-French Blake drain was placed in  the original port incision and directed to the apex.  It was secured with #1 Silk suture.  The frozen section returned showing adenocarcinoma of the nodule.  The bronchial margin was negative for tumor.   The remaining incisions were closed in standard fashion.  The chest tube was placed to a Pleur-Evac on waterseal.  The patient was placed back in a supine position.  He was extubated in the operating room and taken to the postanesthetic care unit in good  condition.  All sponge, needle and instrument counts were correct at the end of the procedure.  Experienced assistance was necessary for this case due to surgical complexity.  Doree Fudge served as the Geophysicist/field seismologist providing assistance with port placement, camera management, robot docking and undocking, instrument exchange, specimen retrieval,  suctioning, and wound closure.   PUS D: 10/27/2022 3:42:56 pm T: 10/27/2022 4:34:00 pm  JOB: [id number]/ [id number]

## 2022-10-27 NOTE — Interval H&P Note (Signed)
History and Physical Interval Note:  PFTs OK  10/27/2022 6:54 AM  Raymond [name]  has presented today for surgery, with the diagnosis of RLL LUNG NODULE.  The various methods of treatment have been discussed with the patient and family. After consideration of risks, benefits and other options for treatment, the patient has consented to  Procedure(s): XI ROBOTIC ASSISTED THORACOSCOPY-RIGHT LOWER LOBE SUPERIOR SEGMENTECTOMY (Right) as a surgical intervention.  The patient's history has been reviewed, patient examined, no change in status, stable for surgery.  I have reviewed the patient's chart and labs.  Questions were answered to the patient's satisfaction.     Loreli Slot

## 2022-10-27 NOTE — Hospital Course (Addendum)
HPI: This is a 57 year old man with a complicated past medical history including tobacco abuse, COPD, reflux, hypertension, hyperlipidemia, CAD, MI, depression, PTSD, sleep apnea, type 2 diabetes, and a newly discovered lung nodule.  He has a 40-pack-year history of smoking (1 PPD x 40 years).  He continues to smoke.   He had a low-dose CT for lung cancer screening in early May.  It showed a new 12.5 mm spiculated nodule in the superior segment of the right lower lobe.  That led to a PET/CT which showed the nodule was hypermetabolic.  There was no evidence of regional or distant metastatic disease.   He has numerous complaints including shortness of breath with exertion and claudication, both of which are limiting, but he can walk up a flight of stairs.  No chest pain, pressure, or tightness.  He has has had a loss of appetite and decreased energy.  Suffers from anxiety, depression, and PTSD.  Disabled from work.  Dr. Dorris Fetch discussed the need for robotic assisted right thoracoscopy, superior segmentectomy of right lower lobe, lymph node dissection, and intercostal nerve block. Potential risks, benefits, and complications of the surgery were discussed with the patient and he agreed to proceed with surgery.   Hospital Course: Patient underwent a Xi robotic assisted right thoracoscopy, RLL superior segmentectomy of RLL, LN dissection,  and intercostal nerve block. He was extubated and transferred from the OR to PACU in stale condition.

## 2022-10-28 ENCOUNTER — Inpatient Hospital Stay (HOSPITAL_COMMUNITY): Payer: 59

## 2022-10-28 ENCOUNTER — Encounter (HOSPITAL_COMMUNITY): Payer: Self-pay | Admitting: Thoracic Surgery (Cardiothoracic Vascular Surgery)

## 2022-10-28 DIAGNOSIS — Z9889 Other specified postprocedural states: Secondary | ICD-10-CM

## 2022-10-28 LAB — CBC
HCT: 35.5 % — ABNORMAL LOW (ref 39.0–52.0)
Hemoglobin: 12 g/dL — ABNORMAL LOW (ref 13.0–17.0)
MCH: 30.1 pg (ref 26.0–34.0)
MCHC: 33.8 g/dL (ref 30.0–36.0)
MCV: 89 fL (ref 80.0–100.0)
Platelets: 158 10*3/uL (ref 150–400)
RBC: 3.99 MIL/uL — ABNORMAL LOW (ref 4.22–5.81)
RDW: 12.4 % (ref 11.5–15.5)
WBC: 10.6 10*3/uL — ABNORMAL HIGH (ref 4.0–10.5)
nRBC: 0 % (ref 0.0–0.2)

## 2022-10-28 LAB — BASIC METABOLIC PANEL
Anion gap: 8 (ref 5–15)
BUN: 19 mg/dL (ref 6–20)
CO2: 23 mmol/L (ref 22–32)
Calcium: 7.9 mg/dL — ABNORMAL LOW (ref 8.9–10.3)
Chloride: 100 mmol/L (ref 98–111)
Creatinine, Ser: 1.06 mg/dL (ref 0.61–1.24)
GFR, Estimated: >60 mL/min (ref >60)
Glucose, Bld: 209 mg/dL — ABNORMAL HIGH (ref 70–99)
Potassium: 3.6 mmol/L (ref 3.5–5.1)
Sodium: 131 mmol/L — ABNORMAL LOW (ref 135–145)

## 2022-10-28 LAB — GLUCOSE, CAPILLARY
Glucose-Capillary: 204 mg/dL — ABNORMAL HIGH (ref 70–99)
Glucose-Capillary: 209 mg/dL — ABNORMAL HIGH (ref 70–99)
Glucose-Capillary: 243 mg/dL — ABNORMAL HIGH (ref 70–99)
Glucose-Capillary: 259 mg/dL — ABNORMAL HIGH (ref 70–99)
Glucose-Capillary: 340 mg/dL — ABNORMAL HIGH (ref 70–99)

## 2022-10-28 MED ORDER — INSULIN GLARGINE-YFGN 100 UNIT/ML ~~LOC~~ SOLN
35.0000 [IU] | Freq: Every day | SUBCUTANEOUS | Status: DC
  Administered 2022-10-28 – 2022-10-29 (×2): 35 [IU] via SUBCUTANEOUS
  Filled 2022-10-28 (×3): qty 0.35

## 2022-10-28 MED ORDER — INSULIN ASPART 100 UNIT/ML IJ SOLN
0.0000 [IU] | Freq: Three times a day (TID) | INTRAMUSCULAR | Status: DC
  Administered 2022-10-28: 8 [IU] via SUBCUTANEOUS
  Administered 2022-10-28: 16 [IU] via SUBCUTANEOUS
  Administered 2022-10-29: 4 [IU] via SUBCUTANEOUS

## 2022-10-28 NOTE — Progress Notes (Addendum)
      [address]       [name] [address]             [phone]      1 Day Post-Op Procedure(s) (LRB): XI ROBOTIC ASSISTED THORACOSCOPY-RIGHT LOWER LOBE SUPERIOR SEGMENTECTOMY (Right) LYMPH NODE DISSECTION (Right)  Subjective:  Patient with some discomfort,  mostly with coughing, deep breathing.  He is coughing up sputum  Objective: Vital signs in last 24 hours: Temp:  [97.3 F (36.3 C)-98.8 F (37.1 C)] 98.5 F (36.9 C) (07/06 0319) Pulse Rate:  [79-101] 79 (07/06 0319) Cardiac Rhythm: Normal sinus rhythm (07/06 0700) Resp:  [10-21] 16 (07/06 0319) BP: (101-134)/(58-78) 111/67 (07/06 0319) SpO2:  [90 %-96 %] 93 % (07/06 0319) Arterial Line BP: (92-131)/(52-67) 113/55 (07/05 1445) Weight:  [109.7 kg] 109.7 kg (07/05 1648)  Intake/Output from previous day: 07/05 0701 - 07/06 0700 In: 3680.6 [P.O.:480; I.V.:2400.6; IV Piggyback:800.1] Out: 2050 [Urine:1800; Blood:50; Chest Tube:200]  General appearance: alert, cooperative, and no distress Heart: regular rate and rhythm Lungs: coarse RUL, otherwise CTA Abdomen: soft, non-tender; bowel sounds normal; no masses,  no organomegaly Extremities: extremities normal, atraumatic, no cyanosis or edema Wound: clean and dry  Lab Results: Recent Labs    10/25/22 1120 10/28/22 0335  WBC 8.5 10.6*  HGB 15.3 12.0*  HCT 45.5 35.5*  PLT 201 158   BMET:  Recent Labs    10/25/22 1120 10/28/22 0335  NA 134* 131*  K 4.5 3.6  CL 97* 100  CO2 21* 23  GLUCOSE 291* 209*  BUN 16 19  CREATININE 0.91 1.06  CALCIUM 9.5 7.9*    PT/INR:  Recent Labs    10/25/22 1120  LABPROT 13.4  INR 1.0   ABG    Component Value Date/Time   PHART 7.39 10/25/2022 1146   HCO3 23.6 10/25/2022 1146   ACIDBASEDEF 1.2 10/25/2022 1146   O2SAT 97 10/25/2022 1146   CBG (last 3)  Recent Labs    10/27/22 1614 10/28/22 0053 10/28/22 0619  GLUCAP 316* 259* 204*    Assessment/Plan: S/P Procedure(s) (LRB): XI ROBOTIC  ASSISTED THORACOSCOPY-RIGHT LOWER LOBE SUPERIOR SEGMENTECTOMY (Right) LYMPH NODE DISSECTION (Right)  CV- NSR, BP stable- continue home Lopressor Pulm- not requiring oxygen, + COPD on home inhalers.. CT with 300 cc since surgery, no air leak present.. CXR does not show pneumothorax, but significant atelectasis, consolidation... educated patient on importance of deep breathing.. will leave chest tube today.. if output remains low can likely d/c in AM Renal- creatinine stable at 1.07, okay to continue Toradol, will d/c IV fluids today D/C Central line as patient has peripheral access DM- sugars are elevated at times, will transition SSIP to TID/HS and resume home insulin at 35 units, will titrate as indicated Lovenox for DVT prophylaxis   LOS: 1 day    Lowella Dandy, PA-C 10/28/2022   Chart reviewed, patient examined, agree with above. CXR looks ok. I don't see any air leak but tidaling. CT output 300 since surgery. Should be able to remove tomorrow.

## 2022-10-29 ENCOUNTER — Inpatient Hospital Stay (HOSPITAL_COMMUNITY): Payer: 59

## 2022-10-29 LAB — COMPREHENSIVE METABOLIC PANEL
ALT: 15 U/L (ref 0–44)
AST: 18 U/L (ref 15–41)
Albumin: 3 g/dL — ABNORMAL LOW (ref 3.5–5.0)
Alkaline Phosphatase: 75 U/L (ref 38–126)
Anion gap: 8 (ref 5–15)
BUN: 21 mg/dL — ABNORMAL HIGH (ref 6–20)
CO2: 21 mmol/L — ABNORMAL LOW (ref 22–32)
Calcium: 8.3 mg/dL — ABNORMAL LOW (ref 8.9–10.3)
Chloride: 104 mmol/L (ref 98–111)
Creatinine, Ser: 1.02 mg/dL (ref 0.61–1.24)
GFR, Estimated: >60 mL/min (ref >60)
Glucose, Bld: 164 mg/dL — ABNORMAL HIGH (ref 70–99)
Potassium: 4.4 mmol/L (ref 3.5–5.1)
Sodium: 133 mmol/L — ABNORMAL LOW (ref 135–145)
Total Bilirubin: 0.7 mg/dL (ref 0.3–1.2)
Total Protein: 6.3 g/dL — ABNORMAL LOW (ref 6.5–8.1)

## 2022-10-29 LAB — CBC
HCT: 38.2 % — ABNORMAL LOW (ref 39.0–52.0)
Hemoglobin: 12.9 g/dL — ABNORMAL LOW (ref 13.0–17.0)
MCH: 30.2 pg (ref 26.0–34.0)
MCHC: 33.8 g/dL (ref 30.0–36.0)
MCV: 89.5 fL (ref 80.0–100.0)
Platelets: 162 10*3/uL (ref 150–400)
RBC: 4.27 MIL/uL (ref 4.22–5.81)
RDW: 12.9 % (ref 11.5–15.5)
WBC: 10.6 10*3/uL — ABNORMAL HIGH (ref 4.0–10.5)
nRBC: 0 % (ref 0.0–0.2)

## 2022-10-29 LAB — GLUCOSE, CAPILLARY
Glucose-Capillary: 180 mg/dL — ABNORMAL HIGH (ref 70–99)
Glucose-Capillary: 179 mg/dL — ABNORMAL HIGH (ref 70–99)
Glucose-Capillary: 292 mg/dL — ABNORMAL HIGH (ref 70–99)
Glucose-Capillary: 190 mg/dL — ABNORMAL HIGH (ref 70–99)

## 2022-10-29 MED ORDER — INSULIN ASPART 100 UNIT/ML IJ SOLN
0.0000 [IU] | Freq: Three times a day (TID) | INTRAMUSCULAR | Status: DC
  Administered 2022-10-29: 11 [IU] via SUBCUTANEOUS
  Administered 2022-10-29 – 2022-10-30 (×2): 4 [IU] via SUBCUTANEOUS

## 2022-10-29 NOTE — Plan of Care (Signed)
Problem: Education: Goal: Knowledge of disease or condition will improve 10/29/2022 2109 by Thea Gist, RN Outcome: Progressing 10/29/2022 2109 by Thea Gist, RN Outcome: Progressing Goal: Knowledge of the prescribed therapeutic regimen will improve 10/29/2022 2109 by Thea Gist, RN Outcome: Progressing 10/29/2022 2109 by Thea Gist, RN Outcome: Progressing   Problem: Activity: Goal: Risk for activity intolerance will decrease 10/29/2022 2109 by Thea Gist, RN Outcome: Progressing 10/29/2022 2109 by Thea Gist, RN Outcome: Progressing   Problem: Cardiac: Goal: Will achieve and/or maintain hemodynamic stability 10/29/2022 2109 by Thea Gist, RN Outcome: Progressing 10/29/2022 2109 by Thea Gist, RN Outcome: Progressing   Problem: Clinical Measurements: Goal: Postoperative complications will be avoided or minimized 10/29/2022 2109 by Thea Gist, RN Outcome: Progressing 10/29/2022 2109 by Thea Gist, RN Outcome: Progressing   Problem: Respiratory: Goal: Respiratory status will improve 10/29/2022 2109 by Thea Gist, RN Outcome: Progressing 10/29/2022 2109 by Thea Gist, RN Outcome: Progressing   Problem: Pain Management: Goal: Pain level will decrease 10/29/2022 2109 by Thea Gist, RN Outcome: Progressing 10/29/2022 2109 by Thea Gist, RN Outcome: Progressing   Problem: Skin Integrity: Goal: Wound healing without signs and symptoms infection will improve 10/29/2022 2109 by Thea Gist, RN Outcome: Progressing 10/29/2022 2109 by Thea Gist, RN Outcome: Progressing   Problem: Education: Goal: Knowledge of General Education information will improve Description: Including pain rating scale, medication(s)/side effects and non-pharmacologic comfort measures 10/29/2022 2109 by Thea Gist, RN Outcome: Progressing 10/29/2022 2109 by Thea Gist, RN Outcome:  Progressing   Problem: Health Behavior/Discharge Planning: Goal: Ability to manage health-related needs will improve 10/29/2022 2109 by Thea Gist, RN Outcome: Progressing 10/29/2022 2109 by Thea Gist, RN Outcome: Progressing   Problem: Clinical Measurements: Goal: Ability to maintain clinical measurements within normal limits will improve 10/29/2022 2109 by Thea Gist, RN Outcome: Progressing 10/29/2022 2109 by Thea Gist, RN Outcome: Progressing Goal: Will remain free from infection 10/29/2022 2109 by Thea Gist, RN Outcome: Progressing 10/29/2022 2109 by Thea Gist, RN Outcome: Progressing Goal: Diagnostic test results will improve 10/29/2022 2109 by Thea Gist, RN Outcome: Progressing 10/29/2022 2109 by Thea Gist, RN Outcome: Progressing Goal: Respiratory complications will improve 10/29/2022 2109 by Thea Gist, RN Outcome: Progressing 10/29/2022 2109 by Thea Gist, RN Outcome: Progressing Goal: Cardiovascular complication will be avoided 10/29/2022 2109 by Thea Gist, RN Outcome: Progressing 10/29/2022 2109 by Thea Gist, RN Outcome: Progressing   Problem: Activity: Goal: Risk for activity intolerance will decrease 10/29/2022 2109 by Thea Gist, RN Outcome: Progressing 10/29/2022 2109 by Thea Gist, RN Outcome: Progressing   Problem: Nutrition: Goal: Adequate nutrition will be maintained 10/29/2022 2109 by Thea Gist, RN Outcome: Progressing 10/29/2022 2109 by Thea Gist, RN Outcome: Progressing   Problem: Coping: Goal: Level of anxiety will decrease 10/29/2022 2109 by Thea Gist, RN Outcome: Progressing 10/29/2022 2109 by Thea Gist, RN Outcome: Progressing   Problem: Elimination: Goal: Will not experience complications related to bowel motility 10/29/2022 2109 by Thea Gist, RN Outcome: Progressing 10/29/2022 2109 by Thea Gist,  RN Outcome: Progressing Goal: Will not experience complications related to urinary retention 10/29/2022 2109 by Thea Gist, RN Outcome: Progressing 10/29/2022 2109 by Thea Gist, RN Outcome: Progressing   Problem: Pain Managment: Goal: General experience of comfort will improve 10/29/2022 2109 by Margeart Allender, Gilford Raid, RN Outcome: Progressing  10/29/2022 2109 by Thea Gist, RN Outcome: Progressing

## 2022-10-29 NOTE — Progress Notes (Signed)
      [address]       [place], [address]             [phone]      2 Days Post-Op Procedure(s) (LRB): XI ROBOTIC ASSISTED THORACOSCOPY-RIGHT LOWER LOBE SUPERIOR SEGMENTECTOMY (Right) LYMPH NODE DISSECTION (Right)  Subjective:  Patient complains of soreness.  Ambulated in hallway yesterday, moving around room without difficulty.  No BM yet, + passing gas  Objective: Vital signs in last 24 hours: Temp:  [97.9 F (36.6 C)-98.6 F (37 C)] 98.6 F (37 C) (07/07 0335) Pulse Rate:  [69-83] 69 (07/07 0335) Cardiac Rhythm: Normal sinus rhythm (07/07 0700) Resp:  [14-18] 16 (07/07 0335) BP: (98-139)/(66-77) 139/72 (07/07 0335) SpO2:  [90 %-93 %] 93 % (07/07 0335)  Intake/Output from previous day: 07/06 0701 - 07/07 0700 In: 937 [P.O.:937] Out: 1090 [Urine:740; Chest Tube:350] Intake/Output this shift: Total I/O In: -  Out: 50 [Chest Tube:50]  General appearance: alert, cooperative, no distress, and morbidly obese Heart: regular rate and rhythm Lungs: diminished breath sounds bibasilar Abdomen: + distended, non-tender, + BS Extremities: extremities normal, atraumatic, no cyanosis or edema Wound: clean and dry  Lab Results: Recent Labs    10/28/22 0335 10/29/22 0708  WBC 10.6* 10.6*  HGB 12.0* 12.9*  HCT 35.5* 38.2*  PLT 158 162   BMET:  Recent Labs    10/28/22 0335 10/29/22 0708  NA 131* 133*  K 3.6 4.4  CL 100 104  CO2 23 21*  GLUCOSE 209* 164*  BUN 19 21*  CREATININE 1.06 1.02  CALCIUM 7.9* 8.3*    PT/INR: No results for input(s): "LABPROT", "INR" in the last 72 hours. ABG    Component Value Date/Time   PHART 7.39 10/25/2022 1146   HCO3 23.6 10/25/2022 1146   ACIDBASEDEF 1.2 10/25/2022 1146   O2SAT 97 10/25/2022 1146   CBG (last 3)  Recent Labs    10/28/22 1627 10/28/22 2113 10/29/22 0612  GLUCAP 340* 243* 180*    Assessment/Plan: S/P Procedure(s) (LRB): XI ROBOTIC ASSISTED THORACOSCOPY-RIGHT LOWER LOBE SUPERIOR  SEGMENTECTOMY (Right) LYMPH NODE DISSECTION (Right)  CV- NSR, H/O HTN-continue Lopressor Pulm- CT w/o air leak on water seal.. CT output around 300 cc yesterday, CXR with continued atelectasis, no pneumothorax.. will d/c chest tube.. continue IS, add flutter valve.. patient educated on importance of use and taking good deep breaths Renal- creatinine remains stable, Toradol to end today DM- sugars remains poorly controlled, received first dose of long acting insulin last night.. will adjust SSIP and monitor.. continue Farxiga Lovenox for DVT prophylaxis   LOS: 2 days    Zeke Aker, PA-C 10/29/2022   Chart reviewed, patient examined, agree with above. CXR ok and no air leak. DC chest tube. Continue IS, ambulation. Plan home tomorrow if he continues to progress. 

## 2022-10-30 ENCOUNTER — Inpatient Hospital Stay (HOSPITAL_COMMUNITY): Payer: 59

## 2022-10-30 LAB — GLUCOSE, CAPILLARY: Glucose-Capillary: 155 mg/dL — ABNORMAL HIGH (ref 70–99)

## 2022-10-30 MED ORDER — OXYCODONE HCL 5 MG PO TABS: 5.0000 mg | ORAL_TABLET | Freq: Four times a day (QID) | ORAL | 0 refills | Status: DC | PRN

## 2022-10-30 MED ORDER — ACETAMINOPHEN 500 MG PO TABS: 1000.0000 mg | ORAL_TABLET | Freq: Four times a day (QID) | ORAL | 0 refills | Status: DC | PRN

## 2022-10-30 NOTE — Progress Notes (Addendum)
      [address]       [place] [address]             [phone]      3 Days Post-Op Procedure(s) (LRB): XI ROBOTIC ASSISTED THORACOSCOPY-RIGHT LOWER LOBE SUPERIOR SEGMENTECTOMY (Right) LYMPH NODE DISSECTION (Right) Subjective: Pt states he is doing well this AM and his pain is only about a 3/10. He does state he has coughed up some blood tinged mucus.   Objective: Vital signs in last 24 hours: Temp:  [97.8 F (36.6 C)-98.6 F (37 C)] 98.5 F (36.9 C) (07/08 0618) Pulse Rate:  [67-77] 77 (07/08 0618) Cardiac Rhythm: Normal sinus rhythm (07/07 1936) Resp:  [11-19] 17 (07/08 0618) BP: (123-149)/(65-77) 131/69 (07/08 0618) SpO2:  [91 %-96 %] 96 % (07/07 2303)  Hemodynamic parameters for last 24 hours:    Intake/Output from previous day: 07/07 0701 - 07/08 0700 In: 360 [P.O.:360] Out: 150 [Chest Tube:150] Intake/Output this shift: No intake/output data recorded.  General appearance: alert, cooperative, and no distress Neurologic: intact Heart: regular rate and rhythm, S1, S2 normal, no murmur, click, rub or gallop Lungs: clear to auscultation bilaterally Abdomen: soft, non-tender; bowel sounds normal; no masses,  no organomegaly Extremities: extremities normal, atraumatic, no cyanosis or edema Wound: Clean and dry, no erythema or sign of infection  Lab Results: Recent Labs    10/28/22 0335 10/29/22 0708  WBC 10.6* 10.6*  HGB 12.0* 12.9*  HCT 35.5* 38.2*  PLT 158 162   BMET:  Recent Labs    10/28/22 0335 10/29/22 0708  NA 131* 133*  K 3.6 4.4  CL 100 104  CO2 23 21*  GLUCOSE 209* 164*  BUN 19 21*  CREATININE 1.06 1.02  CALCIUM 7.9* 8.3*    PT/INR: No results for input(s): "LABPROT", "INR" in the last 72 hours. ABG    Component Value Date/Time   PHART 7.39 10/25/2022 1146   HCO3 23.6 10/25/2022 1146   ACIDBASEDEF 1.2 10/25/2022 1146   O2SAT 97 10/25/2022 1146   CBG (last 3)  Recent Labs    10/29/22 1522 10/29/22 2117  10/30/22 0620  GLUCAP 292* 190* 155*    Assessment/Plan: S/P Procedure(s) (LRB): XI ROBOTIC ASSISTED THORACOSCOPY-RIGHT LOWER LOBE SUPERIOR SEGMENTECTOMY (Right) LYMPH NODE DISSECTION (Right)  CV: NSR, BP controlled. Continue home Lopressor 100mg  BID.   Pulm: Saturating 91-96% on RA. CT removed yesterday. Stable CXR, no pneumothorax seen pending final read. Pt coughed up some blood tinged mucus, states it was a very small amount of blood. Possibly due to surgery, should resolve with time.   GI: +BM 07/06, good appetite  Endo: CBGs not well controlled, 292/190/155. On Farxiga, SSI and 35U Semglee, on 70U of insulin glargine at home. Preop A1C 8.8. Will restart home meds at discharge. Pt followed closely by PCP.   DVT Prophylaxis: Lovenox, ambulation  Dispo: Plan to d/c home today   LOS: 3 days    Jenny Reichmann, PA-C 10/30/2022 Patient seen and examined, agree with above Path still pending Home today  Salvatore Decent. Dorris Fetch, MD [facility] [phone]

## 2022-10-30 NOTE — Progress Notes (Signed)
Written discharge instructions provided to patient. Instructions were verbally reviewed with patient.  Mr. Iwen stated understanding of instructions including incision care, follow up appointments and medication directions.  All belongings returned to patient.  Patient to be transported to discharge lounge to await his brother's arrival for transport home.

## 2022-10-31 LAB — SURGICAL PATHOLOGY

## 2022-11-06 ENCOUNTER — Ambulatory Visit (INDEPENDENT_AMBULATORY_CARE_PROVIDER_SITE_OTHER): Payer: 59 | Admitting: Physician Assistant

## 2022-11-06 ENCOUNTER — Encounter: Payer: Self-pay | Admitting: Physician Assistant

## 2022-11-06 VITALS — BP 115/75 | HR 125 | Temp 98.3°F | Resp 16 | Ht 69.0 in | Wt 235.2 lb

## 2022-11-06 DIAGNOSIS — Z09 Encounter for follow-up examination after completed treatment for conditions other than malignant neoplasm: Secondary | ICD-10-CM

## 2022-11-06 DIAGNOSIS — Z9889 Other specified postprocedural states: Secondary | ICD-10-CM | POA: Diagnosis not present

## 2022-11-06 DIAGNOSIS — R918 Other nonspecific abnormal finding of lung field: Secondary | ICD-10-CM

## 2022-11-06 NOTE — Progress Notes (Signed)
[facility] [address] [place], [place] [address]  Internal MEDICINE  Office Visit Note  Patient Name: Raymond [name]  [id number]  [id number]  Date of Service: 11/06/2022     Chief Complaint  Patient presents with   Hospitalization Follow-up     HPI Pt is here for recent hospital follow up from robotic assisted thoracoscopy--right lower lobe superior segmentectomy on 10/27/22 for right lower pulmonary nodule. He was discharged on 10/30/22 -He states he has been doing well since surgery, though still recovering -Will be having sutures removed on the 18th and then has a follow up with CT surgery on the 31st -Breathing doing ok overall -Does have some pain still and states he does have medication to help with this.  -Having some faster heart rates intermittently due to pain or feeling anxious, such as coming into office today. Did also discuss staying hydrated. Denies dizziness or lightheadedness and advised to monitor  Current Medication: Outpatient Encounter Medications as of 11/06/2022  Medication Sig   ACCU-CHEK FASTCLIX LANCETS MISC yse as directed twice a day   acetaminophen (TYLENOL) 500 MG tablet Take 2 tablets (1,000 mg total) by mouth every 6 (six) hours as needed.   albuterol (VENTOLIN HFA) 108 (90 Base) MCG/ACT inhaler INHALE 2 PUFFS INTO THE LUNGS EVERY 6 HOURS AS MEEDED FOR WHEEZING OR SHORTNESS OF BREATH   aspirin EC 81 MG tablet Take 81 mg by mouth daily at 3 pm.    atorvastatin (LIPITOR) 40 MG tablet Take 1 tablet (40 mg total) by mouth daily.   BREZTRI AEROSPHERE 160-9-4.8 MCG/ACT AERO INHALE 2 PUFFS TWICE DAILY   buPROPion (WELLBUTRIN XL) 150 MG 24 hr tablet Take 1 tablet (150 mg total) by mouth daily. Take along with 300 mg daily   buPROPion (WELLBUTRIN XL) 300 MG 24 hr tablet Take 1 tablet (300 mg total) by mouth daily. Take along with 150 mg daily   Continuous Blood Gluc Sensor (FREESTYLE LIBRE 3 SENSOR) MISC Apply 1 sensor to the skin every 14  days   cyclobenzaprine (FLEXERIL) 10 MG tablet Take 1 tablet (10 mg total) by mouth at bedtime as needed for muscle spasms.   dapagliflozin propanediol (FARXIGA) 10 MG TABS tablet Take 1 tablet (10 mg total) by mouth daily before breakfast.   DULoxetine (CYMBALTA) 30 MG capsule Take 1 capsule (30 mg total) by mouth daily. Take along with 60 mg daily, total of 90 mg daily   DULoxetine (CYMBALTA) 60 MG capsule TAKE 1 CAPSULE BY MOUTH ONCE DAILY (TAKE  ALONG  WITH  30MG   DAILY)   ezetimibe (ZETIA) 10 MG tablet Take 1 tablet (10 mg total) by mouth daily.   famotidine (PEPCID) 20 MG tablet Take 1 tablet (20 mg total) by mouth 2 (two) times daily.   gabapentin (NEURONTIN) 100 MG capsule Take 200 mg by mouth at bedtime.   gabapentin (NEURONTIN) 600 MG tablet Take 0.5 tablets (300 mg total) by mouth 2 (two) times daily. May increase to 1 whole tablet in PM after 1 week.   glucose blood (ACCU-CHEK AVIVA PLUS) test strip Blood sugar testing TID and as needed. E11.65   insulin glargine, 2 Unit Dial, (TOUJEO MAX SOLOSTAR) 300 UNIT/ML Solostar Pen Inject 70 Units into the skin daily.   Insulin Pen Needle 32G X 4 MM MISC Use with daily dose insulin and with sliding scale insulin as needed.   Melatonin 10 MG CAPS Take 20 mg by mouth at bedtime.   metoprolol tartrate (LOPRESSOR) 100  MG tablet Take 1 tablet (100 mg total) by mouth 2 (two) times daily.   nitroGLYCERIN (NITROSTAT) 0.4 MG SL tablet Place 1 tablet (0.4 mg total) under the tongue every 5 (five) minutes x 3 doses as needed for chest pain.   oxyCODONE (OXY IR/ROXICODONE) 5 MG immediate release tablet Take 1 tablet (5 mg total) by mouth every 6 (six) hours as needed for moderate pain.   pantoprazole (PROTONIX) 40 MG tablet Take 1 tablet (40 mg total) by mouth daily.   Semaglutide 7 MG TABS Take 1 tablet (7 mg total) by mouth daily before breakfast. On empty stomach with no more than 4 oz of water, wait 30 min before eating or drinking.   tamsulosin  (FLOMAX) 0.4 MG CAPS capsule Take 1 capsule (0.4 mg total) by mouth daily.   temazepam (RESTORIL) 30 MG capsule TAKE 1 CAPSULE BY MOUTH AT BEDTIME AS NEEDED FOR SLEEP (Patient taking differently: Take 30 mg by mouth at bedtime.)   Varenicline Tartrate, Starter, 0.5 MG X 11 & 1 MG X 42 TBPK Take 0.5 mg by mouth once daily on days 1-3, then take 0.5 mg twice daily on days 4-7, then increase to 1 mg twice daily   Vibegron (GEMTESA) 75 MG TABS Take 1 tablet (75 mg total) by mouth daily.   No facility-administered encounter medications on file as of 11/06/2022.    Surgical History: Past Surgical History:  Procedure Laterality Date    ingrown toenail removal     CHOLECYSTECTOMY N/A 01/23/2019   Procedure: LAPAROSCOPIC CHOLECYSTECTOMY;  Surgeon: Henrene Dodge, MD;  Location: [facility] ORS;  Service: General;  Laterality: N/A;   CLAVICLE SURGERY Right    COLONOSCOPY  2017   COLONOSCOPY WITH PROPOFOL N/A 08/25/2020   Procedure: COLONOSCOPY WITH PROPOFOL;  Surgeon: Pasty Spillers, MD;  Location: [facility];  Service: Endoscopy;  Laterality: N/A;   ESOPHAGOGASTRODUODENOSCOPY (EGD) WITH PROPOFOL N/A 08/25/2020   Procedure: ESOPHAGOGASTRODUODENOSCOPY (EGD) WITH PROPOFOL;  Surgeon: Pasty Spillers, MD;  Location: [facility];  Service: Endoscopy;  Laterality: N/A;   LYMPH NODE DISSECTION Right 10/27/2022   Procedure: LYMPH NODE DISSECTION;  Surgeon: Loreli Slot, MD;  Location: [facility] OR;  Service: Thoracic;  Laterality: Right;   MOUTH SURGERY     REMOVED ALL TEETH   SHOULDER SURGERY Right    UPPER GI ENDOSCOPY  2017   XI ROBOTIC ASSISTED THORACOSCOPY- SEGMENTECTOMY Right 10/27/2022   Procedure: XI ROBOTIC ASSISTED THORACOSCOPY-RIGHT LOWER LOBE SUPERIOR SEGMENTECTOMY;  Surgeon: Loreli Slot, MD;  Location: [facility] OR;  Service: Thoracic;  Laterality: Right;    Medical History: Past Medical History:  Diagnosis Date   Anxiety    Arthritis    NECK   Bronchitis    Coronary artery  disease    Depression    Diabetes mellitus without complication (HCC)    Diastasis of rectus abdominis 06/19/2018   Dyspnea    DUE TO GALLBLADDER PER PT   Dysrhythmia    "fast hear rate at times. stopped when placed on metoprolol"   Emphysema lung (HCC)    Family history of adverse reaction to anesthesia    N/V, TROUBLE BREATHING COMING OUT OF ANESTHESIA   Fatty liver    GERD (gastroesophageal reflux disease)    Headache    MIGRAINES   Hyperlipidemia    Hypertension    Irregular heart beat unk   Myocardial infarction (HCC) 2010   MILD    PTSD (post-traumatic stress disorder)    Sleep apnea  USES CPAP   Tachycardia    Ventral hernia     Family History: Family History  Problem Relation Age of Onset   Hypertension Mother    Diabetes type II Mother    Diabetes Mother    COPD Father    Cancer Father    Heart disease Father    Diabetes Sister    Anxiety disorder Sister    Depression Sister    Diabetes Brother    Anxiety disorder Brother    Depression Brother    Diabetes Maternal Aunt    Diabetes Sister    Anxiety disorder Sister    Depression Sister    Diabetes Sister    Anxiety disorder Sister    Depression Sister    Diabetes Sister    Anxiety disorder Sister    Depression Sister    Colon cancer Maternal Uncle     Social History   Socioeconomic History   Marital status: Divorced    Spouse name: Not on file   Number of children: 4   Years of education: Not on file   Highest education level: 8th grade  Occupational History   Not on file  Tobacco Use   Smoking status: Every Day    Current packs/day: 0.25    Average packs/day: 0.3 packs/day for 40.0 years (10.0 ttl pk-yrs)    Types: Cigarettes   Smokeless tobacco: Former    Types: Chew   Tobacco comments:    2-3 cigarettes per day as of October 25, 2022  Vaping Use   Vaping status: Former  Substance and Sexual Activity   Alcohol use: No    Alcohol/week: 0.0 standard drinks of alcohol   Drug use: No    Sexual activity: Not Currently  Other Topics Concern   Not on file  Social History Narrative   Not on file   Social Determinants of Health   Financial Resource Strain: Low Risk  (08/17/2020)   Overall Financial Resource Strain (CARDIA)    Difficulty of Paying Living Expenses: Not very hard  Food Insecurity: Food Insecurity Present (05/23/2018)   Hunger Vital Sign    Worried About Running Out of Food in the Last Year: Often true    Ran Out of Food in the Last Year: Often true  Transportation Needs: No Transportation Needs (05/23/2018)   PRAPARE - Administrator, Civil Service (Medical): No    Lack of Transportation (Non-Medical): No  Physical Activity: Inactive (05/23/2018)   Exercise Vital Sign    Days of Exercise per Week: 0 days    Minutes of Exercise per Session: 0 min  Stress: Stress Concern Present (05/23/2018)   [employer] of Occupational Health - Occupational Stress Questionnaire    Feeling of Stress : Very much  Social Connections: Unknown (05/23/2018)   Social Connection and Isolation Panel [NHANES]    Frequency of Communication with Friends and Family: Not on file    Frequency of Social Gatherings with Friends and Family: Not on file    Attends Religious Services: Never    Database administrator or Organizations: No    Attends Banker Meetings: Never    Marital Status: Divorced  Catering manager Violence: Not At Risk (05/23/2018)   Humiliation, Afraid, Rape, and Kick questionnaire    Fear of Current or Ex-Partner: No    Emotionally Abused: No    Physically Abused: No    Sexually Abused: No      Review of Systems  Constitutional:  Negative for chills, fatigue and unexpected weight change.  HENT:  Negative for congestion, rhinorrhea, sneezing and sore throat.   Eyes:  Negative for redness.  Respiratory:  Negative for cough, chest tightness and shortness of breath.   Cardiovascular:  Negative for chest pain and palpitations.   Gastrointestinal:  Negative for abdominal pain, constipation, diarrhea, nausea and vomiting.  Genitourinary:  Positive for flank pain. Negative for dysuria and frequency.  Musculoskeletal:  Negative for arthralgias, back pain, joint swelling and neck pain.  Skin:  Positive for color change and wound. Negative for rash.       Bruising and previous incisions  Neurological: Negative.  Negative for tremors and numbness.  Hematological:  Negative for adenopathy. Does not bruise/bleed easily.  Psychiatric/Behavioral:  Negative for behavioral problems (Depression), sleep disturbance and suicidal ideas. The patient is nervous/anxious.     Vital Signs: BP 115/75   Pulse (!) 125   Temp 98.3 F (36.8 C)   Resp 16   Ht 5\' 9"  (1.753 m)   Wt 235 lb 3.2 oz (106.7 kg)   SpO2 96%   BMI 34.73 kg/m    Physical Exam Vitals and nursing note reviewed.  Constitutional:      General: He is not in acute distress.    Appearance: Normal appearance. He is obese. He is not ill-appearing.  HENT:     Head: Normocephalic and atraumatic.  Eyes:     Pupils: Pupils are equal, round, and reactive to light.  Cardiovascular:     Rate and Rhythm: Normal rate and regular rhythm.  Pulmonary:     Effort: Pulmonary effort is normal. No respiratory distress.  Skin:    General: Skin is warm and dry.     Findings: Bruising present.     Comments: Well healing incisions along right flank, suture in place, some bruising in stages of healing  Neurological:     Mental Status: He is alert and oriented to person, place, and time.  Psychiatric:        Mood and Affect: Mood normal.        Behavior: Behavior normal.       Assessment/Plan: 1. Hospital discharge follow-up Reviewed hospital course, patient will move forward with scheduled follow ups as planned  2. Right lower lobe lung mass S/P segmentectomy for lung mass, will follow up with CT surgery and pulmonology as planned  3. Status post lung surgery Will  follow up with CT surgery as planned   General Counseling: Brailyn verbalizes understanding of the findings of todays visit and agrees with plan of treatment. I have discussed any further diagnostic evaluation that may be needed or ordered today. We also reviewed his medications today. he has been encouraged to call the office with any questions or concerns that should arise related to todays visit.    Counseling:    No orders of the defined types were placed in this encounter.   This patient was seen by Lynn Ito, PA-C in collaboration with Dr. Beverely Risen as a part of collaborative care agreement.   I have reviewed all medical records from hospital follow up including radiology reports and consults from other physicians. Appropriate follow up diagnostics will be scheduled as needed. Patient/ Family understands the plan of treatment. Time spent 35 minutes.   Dr Lyndon Code, MD Internal Medicine

## 2022-11-09 ENCOUNTER — Ambulatory Visit: Payer: Self-pay

## 2022-11-09 ENCOUNTER — Other Ambulatory Visit: Payer: Self-pay

## 2022-11-09 DIAGNOSIS — Z4802 Encounter for removal of sutures: Secondary | ICD-10-CM

## 2022-11-09 NOTE — Progress Notes (Signed)
The proposed treatment discussed in conference is for discussion purpose only and is not a binding recommendation.  The patients have not been physically examined, or presented with their treatment options.  Therefore, final treatment plans cannot be decided.  

## 2022-11-09 NOTE — Progress Notes (Signed)
Patient arrived for nurse visit to remove suture post- procedure RATS RLL segmentectomy 10/27/22 with Dr. Dorris Fetch.  One Suture removed with no signs/ symptoms of infection noted.  Incisions are well approximated. Patient tolerated procedure well.  Patient/ family instructed to keep the incision sites clean and dry.  Patient/ family acknowledged instructions given.

## 2022-11-16 ENCOUNTER — Encounter: Payer: Self-pay | Admitting: Psychiatry

## 2022-11-16 ENCOUNTER — Telehealth: Payer: 59 | Admitting: Psychiatry

## 2022-11-16 DIAGNOSIS — F3342 Major depressive disorder, recurrent, in full remission: Secondary | ICD-10-CM | POA: Diagnosis not present

## 2022-11-16 DIAGNOSIS — F17200 Nicotine dependence, unspecified, uncomplicated: Secondary | ICD-10-CM

## 2022-11-16 DIAGNOSIS — F17210 Nicotine dependence, cigarettes, uncomplicated: Secondary | ICD-10-CM

## 2022-11-16 DIAGNOSIS — F418 Other specified anxiety disorders: Secondary | ICD-10-CM

## 2022-11-16 DIAGNOSIS — G4701 Insomnia due to medical condition: Secondary | ICD-10-CM

## 2022-11-16 DIAGNOSIS — F4310 Post-traumatic stress disorder, unspecified: Secondary | ICD-10-CM

## 2022-11-16 MED ORDER — BUPROPION HCL ER (XL) 300 MG PO TB24: 300.0000 mg | ORAL_TABLET | Freq: Every day | ORAL | 1 refills | Status: DC

## 2022-11-16 MED ORDER — BUPROPION HCL ER (XL) 150 MG PO TB24: 150.0000 mg | ORAL_TABLET | Freq: Every day | ORAL | 1 refills | Status: DC

## 2022-11-16 MED ORDER — DULOXETINE HCL 60 MG PO CPEP: 60.0000 mg | ORAL_CAPSULE | Freq: Every day | ORAL | 1 refills | Status: DC

## 2022-11-16 NOTE — Progress Notes (Signed)
Virtual Visit via Video Note  I connected with Raymond [name] on 11/16/22 at  2:00 PM EDT by a video enabled telemedicine application and verified that I am speaking with the correct person using two identifiers.  Location Provider Location : [facility] Patient Location : Home  Participants: Patient , Provider    I discussed the limitations of evaluation and management by telemedicine and the availability of in person appointments. The patient expressed understanding and agreed to proceed.   I discussed the assessment and treatment plan with the patient. The patient was provided an opportunity to ask questions and all were answered. The patient agreed with the plan and demonstrated an understanding of the instructions.   The patient was advised to call back or seek an in-person evaluation if the symptoms worsen or if the condition fails to improve as anticipated.    [facility] MD OP Progress Note  11/17/2022 1:02 PM Raymond [name]  MRN:  [id number]  Chief Complaint:  Chief Complaint  Patient presents with   Follow-up   Anxiety   Depression   Medication Refill   HPI: Raymond [name] is a 57 year old Caucasian male, divorced, has a history of MDD, PTSD, insomnia, other specified anxiety disorder, tobacco use disorder, hyperlipidemia, diabetes, hepatic steatosis was evaluated by telemedicine today.  Patient today appeared to be alert, oriented and cooperative.  Patient reports he does have lumbar surgery on 10/27/2022.  I have reviewed notes per Dr. Charlett Lango dated 10/27/2022 - 10/30/2022-patient status post " robot assisted thoracoscopy with right lower lobe superior segmentectomy, intercostal nerve block, right lungs.'  Patient reports he is currently on pain medication-oxycodone which he uses sparingly to help with his pain.  The pain does have an impact on his sleep.  Currently compliant on the temazepam.  Patient reports he is taking Abilify daily.  He does have support system  from his daughter and his brother.  Patient denies any significant depression symptoms.  He does have anxiety about his current health issues although he is handling it well.  Patient reports he is currently compliant on his medications like Wellbutrin, duloxetine, temazepam.  Aware of drug to drug interaction between medications like temazepam and opioid medication, patient to space without and not compliant agitated.  Patient denies any suicidality, homicidality or perceptual disturbances.  Patient is currently cutting back on smoking cigarettes, reports he is currently on Chantix, and that has helped.  Patient denies any other concerns today.  Visit Diagnosis:    ICD-10-CM   1. MDD (major depressive disorder), recurrent, in full remission (HCC)  F33.42 buPROPion (WELLBUTRIN XL) 150 MG 24 hr tablet    2. PTSD (post-traumatic stress disorder)  F43.10 DULoxetine (CYMBALTA) 60 MG capsule    3. Insomnia due to medical condition  G47.01    pain    4. Other specified anxiety disorders  F41.8    Generalized anxiety not occurring more days than not    5. Tobacco use disorder  F17.200 buPROPion (WELLBUTRIN XL) 300 MG 24 hr tablet      Past Psychiatric History: I have reviewed past psychiatric history from progress note on 05/23/2018.Past trials of Tegretol, doxepin, Seroquel, mirtazapine, Belsomra, Lunesta, Ativan, risperidone, Lexapro.  Past Medical History:  Past Medical History:  Diagnosis Date   Anxiety    Arthritis    NECK   Bronchitis    Coronary artery disease    Depression    Diabetes mellitus without complication (HCC)    Diastasis of rectus abdominis 06/19/2018  Dyspnea    DUE TO GALLBLADDER PER PT   Dysrhythmia    "fast hear rate at times. stopped when placed on metoprolol"   Emphysema lung (HCC)    Family history of adverse reaction to anesthesia    N/V, TROUBLE BREATHING COMING OUT OF ANESTHESIA   Fatty liver    GERD (gastroesophageal reflux disease)     Headache    MIGRAINES   Hyperlipidemia    Hypertension    Irregular heart beat unk   Myocardial infarction (HCC) 2010   MILD    PTSD (post-traumatic stress disorder)    Sleep apnea    USES CPAP   Tachycardia    Ventral hernia     Past Surgical History:  Procedure Laterality Date    ingrown toenail removal     CHOLECYSTECTOMY N/A 01/23/2019   Procedure: LAPAROSCOPIC CHOLECYSTECTOMY;  Surgeon: Henrene Dodge, MD;  Location: [facility];  Service: General;  Laterality: N/A;   CLAVICLE SURGERY Right    COLONOSCOPY  2017   COLONOSCOPY WITH PROPOFOL N/A 08/25/2020   Procedure: COLONOSCOPY WITH PROPOFOL;  Surgeon: Pasty Spillers, MD;  Location: [facility];  Service: Endoscopy;  Laterality: N/A;   ESOPHAGOGASTRODUODENOSCOPY (EGD) WITH PROPOFOL N/A 08/25/2020   Procedure: ESOPHAGOGASTRODUODENOSCOPY (EGD) WITH PROPOFOL;  Surgeon: Pasty Spillers, MD;  Location: [facility] END[facility];  Service: Endoscopy;  Laterality: N/A;   LYMPH NODE DISSECTION Right 10/27/2022   Procedure: LYMPH NODE DISSECTION;  Surgeon: Loreli Slot, MD;  Location: [facility];  Service: Thoracic;  Laterality: Right;   MOUTH SURGERY     REMOVED ALL TEETH   SHOULDER SURGERY Right    UPPER GI ENDOSCOPY  2017   XI ROBOTIC ASSISTED THORACOSCOPY- SEGMENTECTOMY Right 10/27/2022   Procedure: XI ROBOTIC ASSISTED THORACOSCOPY-RIGHT LOWER LOBE SUPERIOR SEGMENTECTOMY;  Surgeon: Loreli Slot, MD;  [facility];  Service: Thoracic;  Laterality: Right;    Family Psychiatric History: I have reviewed family psychiatric history from progress note on 05/23/2018.  Family History:  Family History  Problem Relation Age of Onset   Hypertension Mother    Diabetes type II Mother    Diabetes Mother    COPD Father    Cancer Father    Heart disease Father    Diabetes Sister    Anxiety disorder Sister    Depression Sister    Diabetes Brother    Anxiety disorder Brother    Depression Brother    Diabetes Maternal Aunt     Diabetes Sister    Anxiety disorder Sister    Depression Sister    Diabetes Sister    Anxiety disorder Sister    Depression Sister    Diabetes Sister    Anxiety disorder Sister    Depression Sister    Colon cancer Maternal Uncle     Social History: I have reviewed social history from progress note on 05/23/2018. Social History   Socioeconomic History   Marital status: Divorced    Spouse name: Not on file   Number of children: 4   Years of education: Not on file   Highest education level: 8th grade  Occupational History   Not on file  Tobacco Use   Smoking status: Every Day    Current packs/day: 0.25    Average packs/day: 0.3 packs/day for 40.0 years (10.0 ttl pk-yrs)    Types: Cigarettes   Smokeless tobacco: Former    Types: Chew   Tobacco comments:    2-3 cigarettes per day as of October 25, 2022  Vaping Use   Vaping status: Former  Substance and Sexual Activity   Alcohol use: No    Alcohol/week: 0.0 standard drinks of alcohol   Drug use: No   Sexual activity: Not Currently  Other Topics Concern   Not on file  Social History Narrative   Not on file   Social Determinants of Health   Financial Resource Strain: Low Risk  (08/17/2020)   Overall Financial Resource Strain (CARDIA)    Difficulty of Paying Living Expenses: Not very hard  Food Insecurity: Food Insecurity Present (05/23/2018)   Hunger Vital Sign    Worried About Running Out of Food in the Last Year: Often true    Ran Out of Food in the Last Year: Often true  Transportation Needs: No Transportation Needs (05/23/2018)   PRAPARE - Administrator, Civil Service (Medical): No    Lack of Transportation (Non-Medical): No  Physical Activity: Inactive (05/23/2018)   Exercise Vital Sign    Days of Exercise per Week: 0 days    Minutes of Exercise per Session: 0 min  Stress: Stress Concern Present (05/23/2018)   [employer] of Occupational Health - Occupational Stress Questionnaire    Feeling of  Stress : Very much  Social Connections: Unknown (05/23/2018)   Social Connection and Isolation Panel [NHANES]    Frequency of Communication with Friends and Family: Not on file    Frequency of Social Gatherings with Friends and Family: Not on file    Attends Religious Services: Never    Active Member of Clubs or Organizations: No    Attends Banker Meetings: Never    Marital Status: Divorced    Allergies:  Allergies  Allergen Reactions   Onion Anaphylaxis   Norco [Hydrocodone-Acetaminophen] Itching   Nickel Rash   Nicoderm [Nicotine] Rash    Metabolic Disorder Labs: Lab Results  Component Value Date   HGBA1C 8.8 (H) 10/25/2022   MPG 205.86 10/25/2022   MPG 294.83 05/31/2017   No results found for: "PROLACTIN" Lab Results  Component Value Date   CHOL 151 11/11/2021   TRIG 251 (H) 11/11/2021   HDL 34 (L) 11/11/2021   CHOLHDL 4.4 11/11/2021   VLDL 26 12/09/2012   LDLCALC 76 11/11/2021   LDLCALC 153 (H) 08/17/2020   Lab Results  Component Value Date   TSH 1.890 08/17/2020   TSH 4.120 06/10/2018    Therapeutic Level Labs: No results found for: "LITHIUM" No results found for: "VALPROATE" No results found for: "CBMZ"  Current Medications: Current Outpatient Medications  Medication Sig Dispense Refill   ACCU-CHEK FASTCLIX LANCETS MISC yse as directed twice a day 100 each 1   acetaminophen (TYLENOL) 500 MG tablet Take 2 tablets (1,000 mg total) by mouth every 6 (six) hours as needed. 30 tablet 0   albuterol (VENTOLIN HFA) 108 (90 Base) MCG/ACT inhaler INHALE 2 PUFFS INTO THE LUNGS EVERY 6 HOURS AS MEEDED FOR WHEEZING OR SHORTNESS OF BREATH 18 g 1   aspirin EC 81 MG tablet Take 81 mg by mouth daily at 3 pm.      atorvastatin (LIPITOR) 40 MG tablet Take 1 tablet (40 mg total) by mouth daily. 90 tablet 0   BREZTRI AEROSPHERE 160-9-4.8 MCG/ACT AERO INHALE 2 PUFFS TWICE DAILY 11 g 0   Continuous Blood Gluc Sensor (FREESTYLE LIBRE 3 SENSOR) MISC Apply 1 sensor  to the skin every 14 days 6 each 3   cyclobenzaprine (FLEXERIL) 10 MG tablet Take 1 tablet (10 mg total)  by mouth at bedtime as needed for muscle spasms. 30 tablet 5   dapagliflozin propanediol (FARXIGA) 10 MG TABS tablet Take 1 tablet (10 mg total) by mouth daily before breakfast. 90 tablet 2   DULoxetine (CYMBALTA) 30 MG capsule Take 1 capsule (30 mg total) by mouth daily. Take along with 60 mg daily, total of 90 mg daily 90 capsule 1   ezetimibe (ZETIA) 10 MG tablet Take 1 tablet (10 mg total) by mouth daily. 90 tablet 3   famotidine (PEPCID) 20 MG tablet Take 1 tablet (20 mg total) by mouth 2 (two) times daily. 180 tablet 1   gabapentin (NEURONTIN) 100 MG capsule Take 200 mg by mouth at bedtime.     gabapentin (NEURONTIN) 600 MG tablet Take 0.5 tablets (300 mg total) by mouth 2 (two) times daily. May increase to 1 whole tablet in PM after 1 week. 60 tablet 3   glucose blood (ACCU-CHEK AVIVA PLUS) test strip Blood sugar testing TID and as needed. E11.65 100 each 12   insulin glargine, 2 Unit Dial, (TOUJEO MAX SOLOSTAR) 300 UNIT/ML Solostar Pen Inject 70 Units into the skin daily. 18 mL 3   Insulin Pen Needle 32G X 4 MM MISC Use with daily dose insulin and with sliding scale insulin as needed. 100 each 5   Melatonin 10 MG CAPS Take 20 mg by mouth at bedtime.     metoprolol tartrate (LOPRESSOR) 100 MG tablet Take 1 tablet (100 mg total) by mouth 2 (two) times daily. 180 tablet 1   nitroGLYCERIN (NITROSTAT) 0.4 MG SL tablet Place 1 tablet (0.4 mg total) under the tongue every 5 (five) minutes x 3 doses as needed for chest pain. 30 tablet 1   oxyCODONE (OXY IR/ROXICODONE) 5 MG immediate release tablet Take 1 tablet (5 mg total) by mouth every 6 (six) hours as needed for moderate pain. 28 tablet 0   pantoprazole (PROTONIX) 40 MG tablet Take 1 tablet (40 mg total) by mouth daily. 90 tablet 1   Semaglutide 7 MG TABS Take 1 tablet (7 mg total) by mouth daily before breakfast. On empty stomach with no more  than 4 oz of water, wait 30 min before eating or drinking. 30 tablet 3   tamsulosin (FLOMAX) 0.4 MG CAPS capsule Take 1 capsule (0.4 mg total) by mouth daily. 30 capsule 3   temazepam (RESTORIL) 30 MG capsule TAKE 1 CAPSULE BY MOUTH AT BEDTIME AS NEEDED FOR SLEEP (Patient taking differently: Take 30 mg by mouth at bedtime.) 30 capsule 1   Varenicline Tartrate, Starter, 0.5 MG X 11 & 1 MG X 42 TBPK Take 0.5 mg by mouth once daily on days 1-3, then take 0.5 mg twice daily on days 4-7, then increase to 1 mg twice daily 53 each 0   Vibegron (GEMTESA) 75 MG TABS Take 1 tablet (75 mg total) by mouth daily. 30 tablet 0   buPROPion (WELLBUTRIN XL) 150 MG 24 hr tablet Take 1 tablet (150 mg total) by mouth daily. Take along with 300 mg daily 90 tablet 1   buPROPion (WELLBUTRIN XL) 300 MG 24 hr tablet Take 1 tablet (300 mg total) by mouth daily. Take along with 150 mg daily 90 tablet 1   DULoxetine (CYMBALTA) 60 MG capsule Take 1 capsule (60 mg total) by mouth daily. Take along with 30 mg daily 90 capsule 1   No current facility-administered medications for this visit.     Musculoskeletal: Strength & Muscle Tone:  UTA Gait & Station:  Laying in bed Patient leans: N/A  Psychiatric Specialty Exam: Review of Systems  Psychiatric/Behavioral:  Positive for sleep disturbance. The patient is nervous/anxious.     There were no vitals taken for this visit.There is no height or weight on file to calculate BMI.  General Appearance: Fairly Groomed  Eye Contact:  Fair  Speech:  Clear and Coherent  Volume:  Normal  Mood:  Anxious  Affect:  Congruent  Thought Process:  Goal Directed and Descriptions of Associations: Intact  Orientation:  Full (Time, Place, and Person)  Thought Content: Logical   Suicidal Thoughts:  No  Homicidal Thoughts:  No  Memory:  Immediate;   Fair Recent;   Fair Remote;   Fair  Judgement:  Fair  Insight:  Good  Psychomotor Activity:  Normal  Concentration:  Concentration: Fair and  Attention Span: Fair  Recall:  [employer] of Knowledge: Fair  Language: Fair  Akathisia:  No  Handed:  Right  AIMS (if indicated): not done  Assets:  Communication Skills Desire for Improvement Housing Social Support  ADL's:  Intact  Cognition: WNL  Sleep:   Restless due to pain   Screenings: GAD-7    Flowsheet Row Office Visit from 02/09/2022 in [facility] Video Visit from 12/20/2021 in [facility] Video Visit from 06/20/2021 in [facility] Office Visit from 11/22/2020 in [facility]  Total GAD-7 Score 7 6 8 11       Mini-Mental    Flowsheet Row Office Visit from 10/23/2022 in [facility], [facility] Office Visit from 10/19/2021 in [facility], [facility] Clinical Support from 10[facility]18/2020 in [facility], [facility] Clinical Support from 10/17/2019 in [facility], [facility] Clinical Support from 10[facility]08/2018 in [facility], [facility]  Total Score (max 30 points ) 30 30 29 30 27       PHQ2-9    Flowsheet Row Office Visit from 10/23/2022 in [facility], [facility] Office Visit from 02/09/2022 in [facility] Video Visit from 12/20/2021 in [facility] Office Visit from 10/19/2021 in [facility], [facility] Office Visit from 07/12/2021 in [facility], [facility]  PHQ-2 Total Score 1 2 1  0 0  PHQ-9 Total Score -- 8 -- -- --      Flowsheet Row Video Visit from 11/16/2022 in [facility] Admission (Discharged) from 10/27/2022 in  2C CV PROGRESSIVE CARE Pre-Admission Testing 60 from 10/25/2022 in [facility] PREADMISSION TESTING  C-SSRS RISK CATEGORY No Risk No Risk No Risk        Assessment and Plan: Raymond [name] is a 57 year old Caucasian  male, lives in [place], divorced, has a history of MDD, insomnia, diabetes mellitus was evaluated by telemedicine today.  Patient is status post right lung-segmentectomy due to finding of lung mass, currently recovering from the surgery, does report sleep issues likely due to pain, otherwise stable on current medication regimen for his mood.  Plan as noted below.  Plan MDD in remission Cymbalta 90 mg p.o. daily Continue Wellbutrin XL 450 mg p.o. daily  Other specified anxiety disorder-generalized anxiety not occurring more days than not-stable Cymbalta 90 mg p.o. daily  Insomnia-unstable due to pain Temazepam 30 mg p.o. nightly.  Patient aware of drug to drug interaction with temazepam and opioids. Hydroxyzine 25 to 50 mg p.o. nightly as needed Continue CPAP for  OSA-sleep study-01/05/2021-periodic limb movement. Reviewed [place] PMP AWARxE  PTSD-stable Cymbalta 90 mg p.o. daily  Tobacco use disorder-improving Provided counseling for 1 minute. Patient is currently on Chantix.  Patient is currently trying to cut back.     Collaboration of Care: Collaboration of Care: Other I have reviewed notes per Dr. Dorris Fetch as noted above patient status post surgery of his right lungs-currently in pain which does have an impact on his mood and sleep although currently coping well, will consider medication changes as needed  Patient/Guardian was advised Release of Information must be obtained prior to any record release in order to collaborate their care with an outside provider. Patient/Guardian was advised if they have not already done so to contact the registration department to sign all necessary forms in order for [place] to release information regarding their care.   Consent: Patient/Guardian gives verbal consent for treatment and assignment of benefits for services provided during this visit. Patient/Guardian expressed understanding and agreed to proceed.   Follow-up in clinic in 2 to 3 months or  sooner if needed.  This note was generated in part or whole with voice recognition software. Voice recognition is usually quite accurate but there are transcription errors that can and very often do occur. I apologize for any typographical errors that were not detected and corrected.    Jomarie Longs, MD 11/17/2022, 1:02 PM

## 2022-11-20 ENCOUNTER — Emergency Department (HOSPITAL_COMMUNITY): Payer: 59

## 2022-11-20 ENCOUNTER — Observation Stay (HOSPITAL_COMMUNITY)
Admission: EM | Admit: 2022-11-20 | Discharge: 2022-11-22 | Disposition: A | Discharge status: Home / Self Care | Payer: 59 | Attending: Internal Medicine | Admitting: Internal Medicine

## 2022-11-20 ENCOUNTER — Observation Stay (HOSPITAL_COMMUNITY): Payer: 59

## 2022-11-20 ENCOUNTER — Telehealth: Payer: Self-pay

## 2022-11-20 ENCOUNTER — Encounter (HOSPITAL_COMMUNITY): Payer: Self-pay | Admitting: Emergency Medicine

## 2022-11-20 ENCOUNTER — Other Ambulatory Visit: Payer: Self-pay

## 2022-11-20 DIAGNOSIS — I10 Essential (primary) hypertension: Secondary | ICD-10-CM | POA: Insufficient documentation

## 2022-11-20 DIAGNOSIS — J449 Chronic obstructive pulmonary disease, unspecified: Secondary | ICD-10-CM | POA: Insufficient documentation

## 2022-11-20 DIAGNOSIS — R0602 Shortness of breath: Secondary | ICD-10-CM | POA: Diagnosis not present

## 2022-11-20 DIAGNOSIS — K2270 Barrett's esophagus without dysplasia: Secondary | ICD-10-CM | POA: Diagnosis not present

## 2022-11-20 DIAGNOSIS — I2510 Atherosclerotic heart disease of native coronary artery without angina pectoris: Secondary | ICD-10-CM | POA: Diagnosis not present

## 2022-11-20 DIAGNOSIS — Z794 Long term (current) use of insulin: Secondary | ICD-10-CM | POA: Diagnosis not present

## 2022-11-20 DIAGNOSIS — D72829 Elevated white blood cell count, unspecified: Secondary | ICD-10-CM | POA: Diagnosis not present

## 2022-11-20 DIAGNOSIS — E1165 Type 2 diabetes mellitus with hyperglycemia: Secondary | ICD-10-CM | POA: Insufficient documentation

## 2022-11-20 DIAGNOSIS — G4733 Obstructive sleep apnea (adult) (pediatric): Secondary | ICD-10-CM | POA: Diagnosis not present

## 2022-11-20 DIAGNOSIS — K219 Gastro-esophageal reflux disease without esophagitis: Secondary | ICD-10-CM | POA: Diagnosis not present

## 2022-11-20 DIAGNOSIS — J90 Pleural effusion, not elsewhere classified: Principal | ICD-10-CM | POA: Diagnosis present

## 2022-11-20 DIAGNOSIS — N400 Enlarged prostate without lower urinary tract symptoms: Secondary | ICD-10-CM | POA: Diagnosis not present

## 2022-11-20 DIAGNOSIS — F4310 Post-traumatic stress disorder, unspecified: Secondary | ICD-10-CM | POA: Insufficient documentation

## 2022-11-20 DIAGNOSIS — Z79899 Other long term (current) drug therapy: Secondary | ICD-10-CM | POA: Insufficient documentation

## 2022-11-20 DIAGNOSIS — Z7982 Long term (current) use of aspirin: Secondary | ICD-10-CM | POA: Insufficient documentation

## 2022-11-20 DIAGNOSIS — C3431 Malignant neoplasm of lower lobe, right bronchus or lung: Secondary | ICD-10-CM | POA: Insufficient documentation

## 2022-11-20 DIAGNOSIS — Z48813 Encounter for surgical aftercare following surgery on the respiratory system: Secondary | ICD-10-CM | POA: Diagnosis not present

## 2022-11-20 DIAGNOSIS — R918 Other nonspecific abnormal finding of lung field: Secondary | ICD-10-CM | POA: Diagnosis not present

## 2022-11-20 DIAGNOSIS — C3491 Malignant neoplasm of unspecified part of right bronchus or lung: Secondary | ICD-10-CM

## 2022-11-20 DIAGNOSIS — R079 Chest pain, unspecified: Secondary | ICD-10-CM | POA: Diagnosis not present

## 2022-11-20 DIAGNOSIS — F17210 Nicotine dependence, cigarettes, uncomplicated: Secondary | ICD-10-CM

## 2022-11-20 HISTORY — PX: IR THORACENTESIS RIGHT ASP PLEURAL SPACE W/IMG GUIDE: IMG5380

## 2022-11-20 LAB — CBC
HCT: 43 % (ref 39.0–52.0)
Hemoglobin: 14.2 g/dL (ref 13.0–17.0)
MCH: 29.2 pg (ref 26.0–34.0)
MCHC: 33 g/dL (ref 30.0–36.0)
MCV: 88.5 fL (ref 80.0–100.0)
Platelets: 265 10*3/uL (ref 150–400)
RBC: 4.86 MIL/uL (ref 4.22–5.81)
RDW: 12.8 % (ref 11.5–15.5)
WBC: 12.6 10*3/uL — ABNORMAL HIGH (ref 4.0–10.5)
nRBC: 0 % (ref 0.0–0.2)

## 2022-11-20 LAB — PROTEIN, PLEURAL OR PERITONEAL FLUID: Total protein, fluid: 5.5 g/dL

## 2022-11-20 LAB — LACTATE DEHYDROGENASE, PLEURAL OR PERITONEAL FLUID: LD, Fluid: 384 U/L — ABNORMAL HIGH (ref 3–23)

## 2022-11-20 LAB — BASIC METABOLIC PANEL
Anion gap: 13 (ref 5–15)
BUN: 11 mg/dL (ref 6–20)
CO2: 22 mmol/L (ref 22–32)
Calcium: 8.9 mg/dL (ref 8.9–10.3)
Chloride: 95 mmol/L — ABNORMAL LOW (ref 98–111)
Creatinine, Ser: 1.17 mg/dL (ref 0.61–1.24)
GFR, Estimated: >60 mL/min (ref >60)
Glucose, Bld: 222 mg/dL — ABNORMAL HIGH (ref 70–99)
Potassium: 4.1 mmol/L (ref 3.5–5.1)
Sodium: 130 mmol/L — ABNORMAL LOW (ref 135–145)

## 2022-11-20 LAB — BODY FLUID CELL COUNT WITH DIFFERENTIAL
Eos, Fluid: 3 %
Lymphs, Fluid: 44 %
Monocyte-Macrophage-Serous Fluid: 5 % — ABNORMAL LOW (ref 50–90)
Neutrophil Count, Fluid: 48 % — ABNORMAL HIGH (ref 0–25)
Total Nucleated Cell Count, Fluid: 7274 cu mm — ABNORMAL HIGH (ref 0–1000)

## 2022-11-20 LAB — TROPONIN I (HIGH SENSITIVITY)
Troponin I (High Sensitivity): 6 ng/L (ref <18)
Troponin I (High Sensitivity): 7 ng/L (ref <18)

## 2022-11-20 LAB — GLUCOSE, CAPILLARY: Glucose-Capillary: 166 mg/dL — ABNORMAL HIGH (ref 70–99)

## 2022-11-20 LAB — BODY FLUID CULTURE W GRAM STAIN

## 2022-11-20 LAB — CBG MONITORING, ED: Glucose-Capillary: 193 mg/dL — ABNORMAL HIGH (ref 70–99)

## 2022-11-20 MED ORDER — TEMAZEPAM 15 MG PO CAPS: 30.0000 mg | ORAL_CAPSULE | Freq: Every evening | ORAL | Status: DC | PRN

## 2022-11-20 MED ORDER — ASPIRIN 81 MG PO TBEC
81.0000 mg | DELAYED_RELEASE_TABLET | Freq: Every day | ORAL | Status: DC
  Administered 2022-11-20 – 2022-11-22 (×3): 81 mg via ORAL
  Filled 2022-11-20 (×3): qty 1

## 2022-11-20 MED ORDER — PIPERACILLIN-TAZOBACTAM 3.375 G IVPB
3.3750 g | Freq: Once | INTRAVENOUS | Status: AC
  Administered 2022-11-20: 3.375 g via INTRAVENOUS
  Filled 2022-11-20: qty 50

## 2022-11-20 MED ORDER — FENTANYL CITRATE PF 50 MCG/ML IJ SOSY
50.0000 ug | PREFILLED_SYRINGE | Freq: Once | INTRAMUSCULAR | Status: AC
  Administered 2022-11-20: 50 ug via INTRAVENOUS
  Filled 2022-11-20: qty 1

## 2022-11-20 MED ORDER — BUPROPION HCL ER (XL) 300 MG PO TB24
450.0000 mg | ORAL_TABLET | Freq: Every day | ORAL | Status: DC
  Administered 2022-11-20 – 2022-11-22 (×3): 450 mg via ORAL
  Filled 2022-11-20 (×2): qty 1
  Filled 2022-11-20: qty 3

## 2022-11-20 MED ORDER — PIPERACILLIN-TAZOBACTAM 3.375 G IVPB
3.3750 g | Freq: Three times a day (TID) | INTRAVENOUS | Status: DC
  Administered 2022-11-20 – 2022-11-22 (×6): 3.375 g via INTRAVENOUS
  Filled 2022-11-20 (×6): qty 50

## 2022-11-20 MED ORDER — DULOXETINE HCL 60 MG PO CPEP
60.0000 mg | ORAL_CAPSULE | Freq: Every day | ORAL | Status: DC
  Administered 2022-11-20 – 2022-11-22 (×3): 60 mg via ORAL
  Filled 2022-11-20 (×3): qty 1

## 2022-11-20 MED ORDER — OXYCODONE HCL 5 MG PO TABS
5.0000 mg | ORAL_TABLET | ORAL | Status: DC | PRN
  Administered 2022-11-20 – 2022-11-22 (×7): 5 mg via ORAL
  Filled 2022-11-20 (×7): qty 1

## 2022-11-20 MED ORDER — TAMSULOSIN HCL 0.4 MG PO CAPS
0.4000 mg | ORAL_CAPSULE | Freq: Every day | ORAL | Status: DC
  Administered 2022-11-20 – 2022-11-22 (×3): 0.4 mg via ORAL
  Filled 2022-11-20 (×3): qty 1

## 2022-11-20 MED ORDER — GUAIFENESIN-DM 100-10 MG/5ML PO SYRP
5.0000 mL | ORAL_SOLUTION | ORAL | Status: DC | PRN
  Administered 2022-11-21: 5 mL via ORAL
  Filled 2022-11-20 (×2): qty 5

## 2022-11-20 MED ORDER — EZETIMIBE 10 MG PO TABS
10.0000 mg | ORAL_TABLET | Freq: Every day | ORAL | Status: DC
  Administered 2022-11-20 – 2022-11-22 (×3): 10 mg via ORAL
  Filled 2022-11-20 (×3): qty 1

## 2022-11-20 MED ORDER — BUDESON-GLYCOPYRROL-FORMOTEROL 160-9-4.8 MCG/ACT IN AERO: 2.0000 | INHALATION_SPRAY | Freq: Two times a day (BID) | RESPIRATORY_TRACT | Status: DC

## 2022-11-20 MED ORDER — METOPROLOL TARTRATE 25 MG PO TABS
100.0000 mg | ORAL_TABLET | Freq: Two times a day (BID) | ORAL | Status: DC
  Administered 2022-11-20 – 2022-11-22 (×3): 100 mg via ORAL
  Filled 2022-11-20: qty 2
  Filled 2022-11-20: qty 4
  Filled 2022-11-20: qty 2
  Filled 2022-11-20: qty 4

## 2022-11-20 MED ORDER — UMECLIDINIUM BROMIDE 62.5 MCG/ACT IN AEPB
1.0000 | INHALATION_SPRAY | Freq: Every day | RESPIRATORY_TRACT | Status: DC
  Administered 2022-11-21 – 2022-11-22 (×2): 1 via RESPIRATORY_TRACT
  Filled 2022-11-20: qty 7

## 2022-11-20 MED ORDER — LIDOCAINE HCL 1 % IJ SOLN
INTRAMUSCULAR | Status: AC
  Filled 2022-11-20: qty 20

## 2022-11-20 MED ORDER — LIDOCAINE HCL 1 % IJ SOLN
20.0000 mL | Freq: Once | INTRAMUSCULAR | Status: AC
  Administered 2022-11-20: 10 mL via INTRADERMAL

## 2022-11-20 MED ORDER — ATORVASTATIN CALCIUM 40 MG PO TABS
40.0000 mg | ORAL_TABLET | Freq: Every day | ORAL | Status: DC
  Administered 2022-11-20 – 2022-11-22 (×3): 40 mg via ORAL
  Filled 2022-11-20 (×3): qty 1

## 2022-11-20 MED ORDER — GABAPENTIN 300 MG PO CAPS
300.0000 mg | ORAL_CAPSULE | Freq: Two times a day (BID) | ORAL | Status: DC
  Administered 2022-11-20 – 2022-11-22 (×4): 300 mg via ORAL
  Filled 2022-11-20 (×4): qty 1

## 2022-11-20 MED ORDER — PANTOPRAZOLE SODIUM 40 MG PO TBEC
40.0000 mg | DELAYED_RELEASE_TABLET | Freq: Every day | ORAL | Status: DC
  Administered 2022-11-20 – 2022-11-22 (×3): 40 mg via ORAL
  Filled 2022-11-20 (×3): qty 1

## 2022-11-20 MED ORDER — MOMETASONE FURO-FORMOTEROL FUM 200-5 MCG/ACT IN AERO
2.0000 | INHALATION_SPRAY | Freq: Two times a day (BID) | RESPIRATORY_TRACT | Status: DC
  Administered 2022-11-20 – 2022-11-22 (×3): 2 via RESPIRATORY_TRACT
  Filled 2022-11-20: qty 8.8

## 2022-11-20 MED ORDER — INSULIN GLARGINE-YFGN 100 UNIT/ML ~~LOC~~ SOLN
55.0000 [IU] | Freq: Every day | SUBCUTANEOUS | Status: DC
  Administered 2022-11-20 – 2022-11-22 (×2): 55 [IU] via SUBCUTANEOUS
  Filled 2022-11-20 (×4): qty 0.55

## 2022-11-20 MED ORDER — OXYCODONE HCL 5 MG PO TABS
5.0000 mg | ORAL_TABLET | Freq: Once | ORAL | Status: AC
  Administered 2022-11-20: 5 mg via ORAL
  Filled 2022-11-20: qty 1

## 2022-11-20 MED ORDER — INSULIN ASPART 100 UNIT/ML IJ SOLN
0.0000 [IU] | Freq: Three times a day (TID) | INTRAMUSCULAR | Status: DC
  Administered 2022-11-20 – 2022-11-21 (×2): 3 [IU] via SUBCUTANEOUS
  Administered 2022-11-21: 5 [IU] via SUBCUTANEOUS
  Administered 2022-11-22: 2 [IU] via SUBCUTANEOUS
  Administered 2022-11-22: 3 [IU] via SUBCUTANEOUS

## 2022-11-20 MED ORDER — ENOXAPARIN SODIUM 40 MG/0.4ML IJ SOSY
40.0000 mg | PREFILLED_SYRINGE | INTRAMUSCULAR | Status: DC
  Administered 2022-11-20: 40 mg via SUBCUTANEOUS
  Filled 2022-11-20: qty 0.4

## 2022-11-20 NOTE — H&P (Cosign Needed Addendum)
Date: 11/20/2022               Patient Name:  Raymond [name] MRN: [id number]  DOB: 1965/07/04 Age / Sex: 57 y.o., male   PCP: Sallyanne Kuster, NP         Medical Service: Internal Medicine Teaching Service         Attending Physician: Dr. Melene Plan, DO      First Contact: Dr. Jeral Pinch, DO Pager [phone]    Second Contact: Dr. Modena Slater, DO Pager [phone]         After Hours (After 5p/  First Contact Pager: [phone]  weekends / holidays): Second Contact Pager: [phone]   SUBJECTIVE   Chief Complaint: shortness of breath, chest pain  History of Present Illness:   Raymond [name] is a 57 year old with PMH of tobacco use disorder, emphysema, diabetes, and recently diagnosed adenocarcinoma of lung who underwent biopsy of lung nodule 7/5 who presents with dyspnea and pleuritic chest pain for the last 3-4 days. He was discharged from hospital on 07/08 and followed up with pulmonology on 07/15. He was doing well at home until about 3 days ago, when he noted shortness of breath with walking. This worsened to having chest pain with breathing. He had difficulty moving due to pain so his brother brought him to the ED.   In review of systems, he did have a temperature at home of 101 F. Denies cough, nausea, or change in appetite.   Meds:  Aspirin 81 mg Atorvastatin 40 mg Ezetimibe 10 mg Breztri 160-9-4.8 Bupropion 450 mg XL Dapagliflozin 10 mg NOT TAKING Duloxetine 60 mg qd Famotidine 20 mg BID Pantoprazole 40 mg qd Gabapentin 600 mg (1/2 tablet BID) Toujeo Glargine 70 units at bedtime Freestyle libre 3 Semaglutide 7 mg NOT TAKING Metoprolol tartrate 100 mg BID Tamsulosin 0.4 mg Vibegron 75 mg Temazepam 30 mg at bedtime prn (PDMP reviewed) Chantix Cyclobenzaprine 10  Past Medical History  Past Surgical History:  Procedure Laterality Date    ingrown toenail removal     CHOLECYSTECTOMY N/A 01/23/2019   Procedure: LAPAROSCOPIC CHOLECYSTECTOMY;  Surgeon: Henrene Dodge,  MD;  Location: [facility];  Service: General;  Laterality: N/A;   CLAVICLE SURGERY Right    COLONOSCOPY  2017   COLONOSCOPY WITH PROPOFOL N/A 08/25/2020   Procedure: COLONOSCOPY WITH PROPOFOL;  Surgeon: Pasty Spillers, MD;  Location: [facility];  Service: Endoscopy;  Laterality: N/A;   ESOPHAGOGASTRODUODENOSCOPY (EGD) WITH PROPOFOL N/A 08/25/2020   Procedure: ESOPHAGOGASTRODUODENOSCOPY (EGD) WITH PROPOFOL;  Surgeon: Pasty Spillers, MD;  Location: [facility];  Service: Endoscopy;  Laterality: N/A;   LYMPH NODE DISSECTION Right 10/27/2022   Procedure: LYMPH NODE DISSECTION;  Surgeon: Loreli Slot, MD;  Location: [facility] OR;  Service: Thoracic;  Laterality: Right;   MOUTH SURGERY     REMOVED ALL TEETH   SHOULDER SURGERY Right    UPPER GI ENDOSCOPY  2017   XI ROBOTIC ASSISTED THORACOSCOPY- SEGMENTECTOMY Right 10/27/2022   Procedure: XI ROBOTIC ASSISTED THORACOSCOPY-RIGHT LOWER LOBE SUPERIOR SEGMENTECTOMY;  Surgeon: Loreli Slot, MD;  Location: [facility] OR;  Service: Thoracic;  Laterality: Right;    Social:  Lives With: Brother, [name] Occupation: disabled due to PTSD. Previously worked as Music therapist: exwife, Engineer, building services. Brother [name] and daughter Level of Function: iADLs, he is able to take his german Oak on walks without difficulty PCP: [place] in [place], [place], NP Substances: smoked cigarettes for last 50 years, 1.5 PPD at most and now  decreased to 1 PPD. He started chantix recently and is working to stop. Not interested in patch while admitted.  Family History: father with history of lung cancer  Allergies: Allergies as of 11/20/2022 - Review Complete 11/20/2022  Allergen Reaction Noted   Onion Anaphylaxis 03/30/2015   Norco [hydrocodone-acetaminophen] Itching 11/02/2014   Nickel Rash 01/20/2019   Nicoderm [nicotine] Rash 01/03/2021    Review of Systems: A complete ROS was negative except as per HPI.   OBJECTIVE:   Physical Exam: Blood  pressure 121/72, pulse (!) 103, temperature 99.8 F (37.7 C), temperature source Oral, resp. rate (!) 22, SpO2 99%.  Constitutional: well-appearing  Cardiovascular: regular rate and rhythm, no m/r/g Pulmonary/Chest: normal work of breathing on room air, no air movement appreciated in right lower lung field, right upper and left lung fields without wheezing, rhonchi or rales Abdominal: soft, non-tender, non-distended MSK: sites from recent thoracoscopy healing well without signs of infection  Labs: CBC    Component Value Date/Time   WBC 12.6 (H) 11/20/2022 1016   RBC 4.86 11/20/2022 1016   HGB 14.2 11/20/2022 1016   HGB 16.3 11/11/2021 0746   HCT 43.0 11/20/2022 1016   HCT 47.9 11/11/2021 0746   PLT 265 11/20/2022 1016   PLT 221 11/11/2021 0746   MCV 88.5 11/20/2022 1016   MCV 89 11/11/2021 0746   MCV 91 11/28/2013 1506   MCH 29.2 11/20/2022 1016   MCHC 33.0 11/20/2022 1016   RDW 12.8 11/20/2022 1016   RDW 13.1 11/11/2021 0746   RDW 13.6 11/28/2013 1506   LYMPHSABS 2.8 11/19/2021 1134   LYMPHSABS 3.0 11/11/2021 0746   MONOABS 0.6 11/19/2021 1134   EOSABS 0.1 11/19/2021 1134   EOSABS 0.2 11/11/2021 0746   BASOSABS 0.1 11/19/2021 1134   BASOSABS 0.1 11/11/2021 0746     CMP     Component Value Date/Time   NA 130 (L) 11/20/2022 1016   NA 136 11/11/2021 0746   NA 138 11/28/2013 1506   K 4.1 11/20/2022 1016   K 3.7 11/28/2013 1506   CL 95 (L) 11/20/2022 1016   CL 105 11/28/2013 1506   CO2 22 11/20/2022 1016   CO2 26 11/28/2013 1506   GLUCOSE 222 (H) 11/20/2022 1016   GLUCOSE 163 (H) 11/28/2013 1506   BUN 11 11/20/2022 1016   BUN 13 11/11/2021 0746   BUN 12 11/28/2013 1506   CREATININE 1.17 11/20/2022 1016   CREATININE 1.25 11/28/2013 1506   CALCIUM 8.9 11/20/2022 1016   CALCIUM 8.5 11/28/2013 1506   PROT 6.3 (L) 10/29/2022 0708   PROT 7.5 11/11/2021 0746   PROT 8.0 12/08/2012 0914   ALBUMIN 3.0 (L) 10/29/2022 0708   ALBUMIN 4.2 11/11/2021 0746   ALBUMIN 4.1  12/08/2012 0914   AST 18 10/29/2022 0708   AST 28 12/08/2012 0914   ALT 15 10/29/2022 0708   ALT 42 12/08/2012 0914   ALKPHOS 75 10/29/2022 0708   ALKPHOS 118 12/08/2012 0914   BILITOT 0.7 10/29/2022 0708   BILITOT 0.3 11/11/2021 0746   BILITOT 0.3 12/08/2012 0914   GFRNONAA >60 11/20/2022 1016   GFRNONAA >60 11/28/2013 1506   GFRAA >60 01/25/2019 0623   GFRAA >60 11/28/2013 1506    Imaging: cxr with right sided pleural effusion  EKG: personally reviewed my interpretation is sinus tachycardia, unable to view prior EKG.  ASSESSMENT & PLAN:   Assessment & Plan by Problem: Active Problems:   * No active hospital problems. *   Foster Simpson  is a 57 y.o. person living with a history of tobacco use disorder, emphysema, adenocarcinoma of right lower lobe after recent thoracoscopy 7/5 who presented with dyspnea and pleuritic chest pain and admitted for 7/29 on hospital day 0  Right sided pleural effusion Leukocytosis He was febrile to 101 yesterday at home and now has mild leukocytosis at 12.6. Xray showed right sided effusion and IR consulted for thoracentesis. Top differentials include empyema vs malignancy. 1L of hazy amber fluid was removed.  -continue IV piperacillin/ tazobactam -f/u blood fluid culture, protein, path smear, LDH  -blood cx pending -oxycodone 5 mg PRN  Right lower lobe adenocarcinoma diagnosed on thoracoscopy 7/5 Path consistent with moderately differentiated adenocarcinoma. One left node had some atypical cells. He was to follow-up with CT surgery 07/31. They are aware that he is admitted as ED provider spoke with them.  COPD Tobacco use disorder No wheezing appreciated on exam, he is not in COPD exacerbation. Will continue home inhaler therapy with Breztri 160-9-4.8. He is interested in smoking cessation and recently started taking chantix. He is not interested in nicotine patch while in hospital.  DM Last A1c at 8.8 7/24. Medications include toujeo  glargine 70 units at bedtime. He is prescribed but does not take PO semaglutide and SGLT2 as they make him feel unwell. He uses CGM at home to monitor blood sugars. -carb modified diet started -toujeo 70 units at bedtime, start semglee 55 units qhs -gabapentin 300 mg BID -SSI  HTN Medications include Metoprolol tartrate 100 mg BID. Unclear why he is not on first line therapy for HTN.   CAD Home medications include Asa 81 mg, Atorvastatin 40 mg, and Ezetimibe 10 mg. Last LDL at goal in 2023 for secondary prevention.  PTSD He follows with psychiatry. Home medications include Bupropion 450 mg XL, Duloxetine 60 mg every day and Temazepam 30 mg at bedtime prn (PDMP reviewed)  GERD Barretts esophagus Continue Pantoprazole 40 mg qd  BPH Continue Tamsulosin 0.4 mg.  Diet: Carb-Modified VTE: Enoxaparin Code:   Prior to Admission Living Arrangement: home living with brother, [name] Anticipated Discharge Location: Home Barriers to Discharge: evaluation for cause of right sided pleural effusion  Dispo: Admit patient to Observation with expected length of stay less than 2 midnights.  Signed: Rudene Christians, DO Internal Medicine Resident PGY-3  11/20/2022, 12:28 PM

## 2022-11-20 NOTE — Telephone Encounter (Signed)
Pt called that he just had lung surgery and he is having  chest pain ,fever and sob and alyssa spoke with pt also and advised him to go to  ER asap

## 2022-11-20 NOTE — ED Provider Notes (Signed)
EMERGENCY DEPARTMENT AT [facility] Provider Note   CSN: [id number] Arrival date & time: 11/20/22  4098     History  Chief Complaint  Patient presents with   Shortness of Breath     Patient is a 57 year old male with past medical history of hypertension, aortic atherosclerosis, OSA, emphysema, GERD, barrett's esophagus, diabetes, neck pain, anxiety, depression, PTSD, insomnia, and lung cancer status post right lobectomy on 10/27/2022, presenting today for worsening shortness of breath.  He states he started to develop chest pain and shortness of breath worsening over the last 2 days.  He admits to fevers at home of up to 101 last night. He has worsening in his symptoms with exertion.  He admits to a productive cough with clear sputum.  He denies any hemoptysis.  He denies any nausea, vomiting or abdominal pain. He is not currently on any abx.  He is not currently on chemo or radiation.  He states he is awaiting further test results.    Home Medications Prior to Admission medications   Medication Sig Start Date End Date Taking? Authorizing Provider  acetaminophen (TYLENOL) 500 MG tablet Take 2 tablets (1,000 mg total) by mouth every 6 (six) hours as needed. 10/30/22  Yes Stehler, Oren Bracket, PA-C  albuterol (VENTOLIN HFA) 108 (90 Base) MCG/ACT inhaler INHALE 2 PUFFS INTO THE LUNGS EVERY 6 HOURS AS MEEDED FOR WHEEZING OR SHORTNESS OF BREATH 01/01/22  Yes Abernathy, Arlyss Repress, NP  aspirin EC 81 MG tablet Take 81 mg by mouth daily at 3 pm.  01/12/16  Yes [provider]  atorvastatin (LIPITOR) 40 MG tablet Take 1 tablet (40 mg total) by mouth daily. 10/23/22  Yes Abernathy, Alyssa, NP  BREZTRI AEROSPHERE 160-9-4.8 MCG/ACT AERO INHALE 2 PUFFS TWICE DAILY 06/28/22  Yes Abernathy, Alyssa, NP  buPROPion (WELLBUTRIN XL) 150 MG 24 hr tablet Take 1 tablet (150 mg total) by mouth daily. Take along with 300 mg daily 11/16/22  Yes Eappen, Saramma, MD  buPROPion (WELLBUTRIN XL) 300 MG 24  hr tablet Take 1 tablet (300 mg total) by mouth daily. Take along with 150 mg daily 11/16/22  Yes Eappen, Levin Bacon, MD  cyclobenzaprine (FLEXERIL) 10 MG tablet Take 1 tablet (10 mg total) by mouth at bedtime as needed for muscle spasms. 10/23/22  Yes Abernathy, Arlyss Repress, NP  dapagliflozin propanediol (FARXIGA) 10 MG TABS tablet Take 1 tablet (10 mg total) by mouth daily before breakfast. 07/27/22  Yes Abernathy, Alyssa, NP  DULoxetine (CYMBALTA) 30 MG capsule Take 1 capsule (30 mg total) by mouth daily. Take along with 60 mg daily, total of 90 mg daily 07/19/22  Yes Eappen, Saramma, MD  DULoxetine (CYMBALTA) 60 MG capsule Take 1 capsule (60 mg total) by mouth daily. Take along with 30 mg daily 11/16/22  Yes Eappen, Levin Bacon, MD  ezetimibe (ZETIA) 10 MG tablet Take 1 tablet (10 mg total) by mouth daily. 10/23/22  Yes Abernathy, Arlyss Repress, NP  famotidine (PEPCID) 20 MG tablet Take 1 tablet (20 mg total) by mouth 2 (two) times daily. 07/27/22  Yes Abernathy, Arlyss Repress, NP  gabapentin (NEURONTIN) 100 MG capsule Take 200 mg by mouth at bedtime.   Yes [provider]  gabapentin (NEURONTIN) 600 MG tablet Take 0.5 tablets (300 mg total) by mouth 2 (two) times daily. May increase to 1 whole tablet in PM after 1 week. 10/23/22  Yes Abernathy, Alyssa, NP  insulin glargine, 2 Unit Dial, (TOUJEO MAX SOLOSTAR) 300 UNIT/ML Solostar Pen Inject 70 Units into the  skin daily. 10/16/22  Yes Abernathy, Arlyss Repress, NP  Melatonin 10 MG CAPS Take 30 mg by mouth at bedtime.   Yes [provider]  metoprolol tartrate (LOPRESSOR) 100 MG tablet Take 1 tablet (100 mg total) by mouth 2 (two) times daily. 10/23/22  Yes Abernathy, Arlyss Repress, NP  nitroGLYCERIN (NITROSTAT) 0.4 MG SL tablet Place 1 tablet (0.4 mg total) under the tongue every 5 (five) minutes x 3 doses as needed for chest pain. 10/19/21  Yes Abernathy, Alyssa, NP  oxyCODONE (OXY IR/ROXICODONE) 5 MG immediate release tablet Take 1 tablet (5 mg total) by mouth every 6 (six) hours as  needed for moderate pain. 10/30/22  Yes Stehler, Oren Bracket, PA-C  pantoprazole (PROTONIX) 40 MG tablet Take 1 tablet (40 mg total) by mouth daily. 07/27/22  Yes Abernathy, Arlyss Repress, NP  tamsulosin (FLOMAX) 0.4 MG CAPS capsule Take 1 capsule (0.4 mg total) by mouth daily. 10/23/22  Yes Abernathy, Alyssa, NP  temazepam (RESTORIL) 30 MG capsule TAKE 1 CAPSULE BY MOUTH AT BEDTIME AS NEEDED FOR SLEEP Patient taking differently: Take 30 mg by mouth at bedtime. 10/07/22  Yes Jomarie Longs, MD  Varenicline Tartrate, Starter, 0.5 MG X 11 & 1 MG X 42 TBPK Take 0.5 mg by mouth once daily on days 1-3, then take 0.5 mg twice daily on days 4-7, then increase to 1 mg twice daily 10/23/22  Yes Abernathy, Arlyss Repress, NP  ACCU-CHEK FASTCLIX LANCETS MISC yse as directed twice a day 05/07/17   Carlean Jews, NP  Continuous Blood Gluc Sensor (FREESTYLE LIBRE 3 SENSOR) MISC Apply 1 sensor to the skin every 14 days 07/27/22   Sallyanne Kuster, NP  glucose blood (ACCU-CHEK AVIVA PLUS) test strip Blood sugar testing TID and as needed. E11.65 01/16/20   Carlean Jews, NP  Insulin Pen Needle 32G X 4 MM MISC Use with daily dose insulin and with sliding scale insulin as needed. 01/16/20   Carlean Jews, NP  Semaglutide 7 MG TABS Take 1 tablet (7 mg total) by mouth daily before breakfast. On empty stomach with no more than 4 oz of water, wait 30 min before eating or drinking. Patient not taking: Reported on 11/20/2022 10/23/22   Sallyanne Kuster, NP  Vibegron (GEMTESA) 75 MG TABS Take 1 tablet (75 mg total) by mouth daily. Patient not taking: Reported on 11/20/2022 08/16/22   Riki Altes, MD      Allergies    Onion, Norco [hydrocodone-acetaminophen], Nickel, and Nicoderm [nicotine]    Review of Systems    Negative except for as noted above in the HPI  Physical Exam Updated Vital Signs BP (!) 97/54   Pulse 96   Temp 98.3 F (36.8 C) (Oral)   Resp 18   Ht 5\' 9"  (1.753 m)   Wt 104.3 kg   SpO2 93%   BMI 33.97 kg/m   Physical Exam Vitals and nursing note reviewed.  Constitutional:      General: He is not in acute distress.    Appearance: Normal appearance. He is well-developed.  HENT:     Head: Normocephalic and atraumatic.  Eyes:     General: No scleral icterus.    Conjunctiva/sclera: Conjunctivae normal.  Cardiovascular:     Rate and Rhythm: Regular rhythm. Tachycardia present.     Pulses: Normal pulses.     Heart sounds: Normal heart sounds. No murmur heard.    No friction rub. No gallop.  Pulmonary:     Effort: Pulmonary effort is normal. No respiratory  distress.     Breath sounds: No wheezing or rhonchi.     Comments: Diminished breath sounds with crackles present over the right base. No wheezing or rhonchi. Saturating well on room air Abdominal:     General: There is no distension.     Palpations: Abdomen is soft.     Tenderness: There is no abdominal tenderness. There is no guarding or rebound.  Musculoskeletal:        General: No swelling or tenderness.     Cervical back: Neck supple.  Skin:    General: Skin is warm and dry.     Capillary Refill: Capillary refill takes less than 2 seconds.     Findings: No rash.  Neurological:     Mental Status: He is alert and oriented to person, place, and time.  Psychiatric:        Mood and Affect: Mood normal.     ED Results / Procedures / Treatments   Labs (all labs ordered are listed, but only abnormal results are displayed) Labs Reviewed  BASIC METABOLIC PANEL - Abnormal; Notable for the following components:      Result Value   Sodium 130 (*)    Chloride 95 (*)    Glucose, Bld 222 (*)    All other components within normal limits  CBC - Abnormal; Notable for the following components:   WBC 12.6 (*)    All other components within normal limits  BODY FLUID CELL COUNT WITH DIFFERENTIAL - Abnormal; Notable for the following components:   Color, Fluid ORANGE (*)    Appearance, Fluid TURBID (*)    Total Nucleated Cell Count, Fluid  7,274 (*)    Neutrophil Count, Fluid 48 (*)    Monocyte-Macrophage-Serous Fluid 5 (*)    All other components within normal limits  CBG MONITORING, ED - Abnormal; Notable for the following components:   Glucose-Capillary 193 (*)    All other components within normal limits  BODY FLUID CULTURE W GRAM STAIN  CULTURE, BLOOD (ROUTINE X 2)  CULTURE, BLOOD (ROUTINE X 2)  PROTEIN, PLEURAL OR PERITONEAL FLUID  PATHOLOGIST SMEAR REVIEW  HIV ANTIBODY (ROUTINE TESTING W REFLEX)  CBC  COMPREHENSIVE METABOLIC PANEL  LACTATE DEHYDROGENASE, PLEURAL OR PERITONEAL FLUID  LACTATE DEHYDROGENASE  TROPONIN I (HIGH SENSITIVITY)  TROPONIN I (HIGH SENSITIVITY)    EKG EKG Interpretation Date/Time:  Monday November 20 2022 10:00:05 EDT Ventricular Rate:  104 PR Interval:  118 QRS Duration:  80 QT Interval:  318 QTC Calculation: 418 R Axis:   41  Text Interpretation: Sinus tachycardia Otherwise normal ECG Since last tracing rate faster Otherwise no significant change Confirmed by Melene Plan [phone]) on 11/20/2022 10:47:25 AM  Radiology IR THORACENTESIS ASP PLEURAL SPACE W/IMG GUIDE  Result Date: 11/20/2022 INDICATION: Patient with history of recent right lower lobe segmentectomy presents with shortness of breath, CXR showed right pleural effusion. Request for therapeutic and diagnostic thoracentesis. EXAM: ULTRASOUND GUIDED RIGHT THORACENTESIS MEDICATIONS: 10 mL 1% lidocaine COMPLICATIONS: None immediate. PROCEDURE: An ultrasound guided thoracentesis was thoroughly discussed with the patient and questions answered. The benefits, risks, alternatives and complications were also discussed. The patient understands and wishes to proceed with the procedure. Written consent was obtained. Ultrasound was performed to localize and mark an adequate pocket of fluid in the right chest. The area was then prepped and draped in the normal sterile fashion. 1% Lidocaine was used for local anesthesia. Under ultrasound guidance a 6  Fr Safe-T-Centesis catheter was introduced. Thoracentesis was performed. The  catheter was removed and a dressing applied. FINDINGS: A total of approximately 1 L of hazy amber fluid was removed. Samples were sent to the laboratory as requested by the clinical team. Post procedure chest X-ray reviewed, negative for pneumothorax. IMPRESSION: Successful ultrasound guided right thoracentesis yielding 1 L of pleural fluid. Performed by: Lawernce Ion, PA-C Electronically Signed   By: Corlis Leak M.D.   On: 11/20/2022 15:31   DG Chest 1 View  Result Date: 11/20/2022 CLINICAL DATA:  [id number] S/P thoracentesis [id number] EXAM: CHEST  1 VIEW COMPARISON:  Earlier film of the same day FINDINGS: No pneumothorax. Decrease in right pleural effusion with small residual. Improved aeration of right lung base. Left lung clear. Heart size and mediastinal contours are within normal limits. Aortic Atherosclerosis (ICD10-170.0). Right clavicle fixation hardware. IMPRESSION: Decreased right pleural effusion with improved aeration of the right lung base. No pneumothorax. Electronically Signed   By: Corlis Leak M.D.   On: 11/20/2022 15:02   DG Chest 2 View  Result Date: 11/20/2022 CLINICAL DATA:  cp/ post lung surgery EXAM: CHEST - 2 VIEW COMPARISON:  CXR 10/30/22 FINDINGS: New right-sided pleural effusion with a superimposed hazy airspace opacities that could represent atelectasis or infection. Unchanged prominent cardiac contours. No radiographically apparent displaced rib fractures. Visualized upper abdomen is unremarkable. No pneumothorax. Right clavicular screw and plate fixation hardware. IMPRESSION: New right-sided pleural effusion with superimposed hazy airspace opacities that could represent atelectasis or infection. Electronically Signed   By: Lorenza Cambridge M.D.   On: 11/20/2022 11:21      Medications Ordered in ED Medications  enoxaparin (LOVENOX) injection 40 mg (has no administration in time range)  insulin aspart (novoLOG)  injection 0-15 Units (has no administration in time range)  oxyCODONE (Oxy IR/ROXICODONE) immediate release tablet 5 mg (5 mg Oral Given 11/20/22 1607)  aspirin EC tablet 81 mg (has no administration in time range)  atorvastatin (LIPITOR) tablet 40 mg (has no administration in time range)  Budeson-Glycopyrrol-Formoterol 160-9-4.8 MCG/ACT AERO 2 puff (has no administration in time range)  buPROPion (WELLBUTRIN XL) 24 hr tablet 450 mg (has no administration in time range)  DULoxetine (CYMBALTA) DR capsule 60 mg (has no administration in time range)  ezetimibe (ZETIA) tablet 10 mg (has no administration in time range)  gabapentin (NEURONTIN) capsule 300 mg (has no administration in time range)  insulin glargine-yfgn (SEMGLEE) injection 70 Units (has no administration in time range)  metoprolol tartrate (LOPRESSOR) tablet 100 mg (has no administration in time range)  pantoprazole (PROTONIX) EC tablet 40 mg (has no administration in time range)  tamsulosin (FLOMAX) capsule 0.4 mg (has no administration in time range)  temazepam (RESTORIL) capsule 30 mg (has no administration in time range)  piperacillin-tazobactam (ZOSYN) IVPB 3.375 g (0 g Intravenous Stopped 11/20/22 1649)  fentaNYL (SUBLIMAZE) injection 50 mcg (50 mcg Intravenous Given 11/20/22 1322)  oxyCODONE (Oxy IR/ROXICODONE) immediate release tablet 5 mg (5 mg Oral Given 11/20/22 1356)  lidocaine (XYLOCAINE) 1 % (with pres) injection 20 mL (10 mLs Intradermal Given 11/20/22 1410)    ED Course/ Medical Decision Making/ A&P                            Medical Decision Making Problems Addressed: Pleural effusion: complicated acute illness or injury Shortness of breath: complicated acute illness or injury  Amount and/or Complexity of Data Reviewed External Data Reviewed: labs, radiology and notes. Labs: ordered. Radiology: ordered and independent interpretation performed. ECG/medicine  tests: ordered and independent interpretation  performed.  Risk Prescription drug management. Decision regarding hospitalization.   Patient is a 57 year old male with past medical history of hypertension, aortic atherosclerosis, OSA, emphysema, GERD, barrett's esophagus, diabetes, neck pain, anxiety, depression, PTSD, insomnia, and lung cancer status post right lobectomy on 10/27/2022, presenting today for worsening shortness of breath.  On exam, patient is well-appearing.  He is tachycardic on arrival but in regular rhythm.  Lung exam reveals diminished breath sounds in the right base without any wheezing or rhonchi appreciated.  He saturating well on room air.  Given his recent surgical history, concern for pneumothorax, pleural effusion, empyema, pneumonia.  Additionally, given his dyspnea on exertion and chest pain, did consider ACS, however deemed less likely.  Also considered PE given his cancer history, but believe he has other more likely causes of his symptoms at this time.  EKG without any acute ischemic findings.  Chest x-ray shows new right-sided pleural effusion with superimposed hazy airspace opacity.  Lab work reveals mild leukocytosis of 12.6.  Sodium is low at 130.  Initial troponin is negative.  Given concern for new right pleural effusion in the setting of fever, considered possible empyema and started on Zosyn.  Discussed case with CT surgery team.  Darius Bump, RN relayed message from Dr. Dorris Fetch noting that if the patient is short of breath, could consider IR for thoracentesis.  Otherwise, no further interventions are necessary from CT surgery at this time.  Order for IR thoracentesis placed.  Will admit patient for symptomatic pleural effusion, concerning for empyema, parapneumonic effusion, or malignant effusion.  He is hemodynamically stable and appropriate for transfer to the floor   Final Clinical Impression(s) / ED Diagnoses Final diagnoses:  Shortness of breath  Pleural effusion    Rx / DC Orders ED  Discharge Orders     None         Rhys Martini, DO 11/20/22 1715    Melene Plan, DO 11/20/22 1721

## 2022-11-20 NOTE — ED Notes (Signed)
ED TO INPATIENT HANDOFF REPORT  ED Nurse Name and Phone #: [phone]  S Name/Age/Gender Raymond [name] 57 y.o. male Room/Bed: 039C/039C  Code Status   Code Status: Full Code  Home/SNF/Other Home Patient oriented to: self, place, time, and situation Is this baseline? Yes   Triage Complete: Triage complete  Chief Complaint Pleural effusion [J90]  Triage Note Pt here from home with c//o sob and chest pain  , pt had surgery to the right lung on July 5th was feeling fine until 2 days ago   Allergies Allergies  Allergen Reactions   Onion Anaphylaxis   Norco [Hydrocodone-Acetaminophen] Itching   Nickel Rash   Nicoderm [Nicotine] Rash    Level of Care/Admitting Diagnosis ED Disposition     ED Disposition  Admit   Condition  --   Comment  Hospital Area: [facility] [[id number]]  Level of Care: Telemetry Medical [104]  May place patient in observation at [facility] or [facility] if equivalent level of care is available:: No  Covid Evaluation: Asymptomatic - no recent exposure (last 10 days) testing not required  Diagnosis: Pleural effusion [242230]  Admitting Physician: Mercie Eon [[id number]]  Attending Physician: Mercie Eon [[id number]]          B Medical/Surgery History Past Medical History:  Diagnosis Date   Anxiety    Arthritis    NECK   Bronchitis    Coronary artery disease    Depression    Diabetes mellitus without complication (HCC)    Diastasis of rectus abdominis 06/19/2018   Dyspnea    DUE TO GALLBLADDER PER PT   Dysrhythmia    "fast hear rate at times. stopped when placed on metoprolol"   Emphysema lung (HCC)    Family history of adverse reaction to anesthesia    N/V, TROUBLE BREATHING COMING OUT OF ANESTHESIA   Fatty liver    GERD (gastroesophageal reflux disease)    Headache    MIGRAINES   Hyperlipidemia    Hypertension    Irregular heart beat unk   Myocardial infarction (HCC) 2010   MILD    PTSD  (post-traumatic stress disorder)    Sleep apnea    USES CPAP   Tachycardia    Ventral hernia    Past Surgical History:  Procedure Laterality Date    ingrown toenail removal     CHOLECYSTECTOMY N/A 01/23/2019   Procedure: LAPAROSCOPIC CHOLECYSTECTOMY;  Surgeon: Henrene Dodge, MD;  Location: [facility] ORS;  Service: General;  Laterality: N/A;   CLAVICLE SURGERY Right    COLONOSCOPY  2017   COLONOSCOPY WITH PROPOFOL N/A 08/25/2020   Procedure: COLONOSCOPY WITH PROPOFOL;  Surgeon: Pasty Spillers, MD;  Location: [facility] ENDOSCOPY;  Service: Endoscopy;  Laterality: N/A;   ESOPHAGOGASTRODUODENOSCOPY (EGD) WITH PROPOFOL N/A 08/25/2020   Procedure: ESOPHAGOGASTRODUODENOSCOPY (EGD) WITH PROPOFOL;  Surgeon: Pasty Spillers, MD;  Location: [facility] ENDOSCOPY;  Service: Endoscopy;  Laterality: N/A;   IR THORACENTESIS ASP PLEURAL SPACE W/IMG GUIDE  11/20/2022   LYMPH NODE DISSECTION Right 10/27/2022   Procedure: LYMPH NODE DISSECTION;  Surgeon: Loreli Slot, MD;  Location: [facility] OR;  Service: Thoracic;  Laterality: Right;   MOUTH SURGERY     REMOVED ALL TEETH   SHOULDER SURGERY Right    UPPER GI ENDOSCOPY  2017   XI ROBOTIC ASSISTED THORACOSCOPY- SEGMENTECTOMY Right 10/27/2022   Procedure: XI ROBOTIC ASSISTED THORACOSCOPY-RIGHT LOWER LOBE SUPERIOR SEGMENTECTOMY;  Surgeon: Loreli Slot, MD;  Location: [facility] OR;  Service: Thoracic;  Laterality: Right;  A IV Location/Drains/Wounds Patient Lines/Drains/Airways Status     Active Line/Drains/Airways     Name Placement date Placement time Site Days   Peripheral IV 11/20/22 20 G 1" Anterior;Distal;Right;Upper Arm 11/20/22  1321  Arm  less than 1   Incision - 4 Ports Abdomen Right;Upper Right Mid;Upper Umbilicus 01/23/19  1500  -- 1397            Intake/Output Last 24 hours No intake or output data in the 24 hours ending 11/20/22 1829  Labs/Imaging Results for orders placed or performed during the hospital encounter of 11/20/22 (from the  past 48 hour(s))  Basic metabolic panel     Status: Abnormal   Collection Time: 11/20/22 10:16 AM  Result Value Ref Range   Sodium 130 (L) 135 - 145 mmol/L   Potassium 4.1 3.5 - 5.1 mmol/L   Chloride 95 (L) 98 - 111 mmol/L   CO2 22 22 - 32 mmol/L   Glucose, Bld 222 (H) 70 - 99 mg/dL    Comment: Glucose reference range applies only to samples taken after fasting for at least 8 hours.   BUN 11 6 - 20 mg/dL   Creatinine, Ser 1.61 0.61 - 1.24 mg/dL   Calcium 8.9 8.9 - 09.6 mg/dL   GFR, Estimated >04 >54 mL/min    Comment: (NOTE) Calculated using the CKD-EPI Creatinine Equation (2021)    Anion gap 13 5 - 15    Comment: Performed at [facility], [address], [place], [place] [address]  CBC     Status: Abnormal   Collection Time: 11/20/22 10:16 AM  Result Value Ref Range   WBC 12.6 (H) 4.0 - 10.5 K/uL   RBC 4.86 4.22 - 5.81 MIL/uL   Hemoglobin 14.2 13.0 - 17.0 g/dL   HCT 91.4 78.2 - 95.6 %   MCV 88.5 80.0 - 100.0 fL   MCH 29.2 26.0 - 34.0 pg   MCHC 33.0 30.0 - 36.0 g/dL   RDW 21.3 08.6 - 57.8 %   Platelets 265 150 - 400 K/uL   nRBC 0.0 0.0 - 0.2 %    Comment: Performed at [facility], [address] [place], [place] [address]  Troponin I (High Sensitivity)     Status: None   Collection Time: 11/20/22 10:16 AM  Result Value Ref Range   Troponin I (High Sensitivity) 6 <18 ng/L    Comment: (NOTE) Elevated high sensitivity troponin I (hsTnI) values and significant  changes across serial measurements may suggest ACS but many other  chronic and acute conditions are known to elevate hsTnI results.  Refer to the "Links" section for chest pain algorithms and additional  guidance. Performed at [facility], [address], [place], [place] [address]   Troponin I (High Sensitivity)     Status: None   Collection Time: 11/20/22  1:05 PM  Result Value Ref Range   Troponin I (High Sensitivity) 7 <18 ng/L    Comment: (NOTE) Elevated high sensitivity troponin I  (hsTnI) values and significant  changes across serial measurements may suggest ACS but many other  chronic and acute conditions are known to elevate hsTnI results.  Refer to the "Links" section for chest pain algorithms and additional  guidance. Performed at [facility], [address] [place], [place] [address]   Body fluid cell count with differential     Status: Abnormal   Collection Time: 11/20/22  2:11 PM  Result Value Ref Range  Fluid Type-FCT Lung, Right    Color, Fluid ORANGE (A) YELLOW   Appearance, Fluid TURBID (A) CLEAR   Total Nucleated Cell Count, Fluid 7,274 (H) 0 - 1,000 cu mm   Neutrophil Count, Fluid 48 (H) 0 - 25 %   Lymphs, Fluid 44 %   Monocyte-Macrophage-Serous Fluid 5 (L) 50 - 90 %   Eos, Fluid 3 %    Comment: Performed at [facility], [address], [place], [place] [address]  Protein, pleural or peritoneal fluid     Status: None   Collection Time: 11/20/22  2:11 PM  Result Value Ref Range   Total protein, fluid 5.5 g/dL    Comment: (NOTE) No normal range established for this test Results should be evaluated in conjunction with serum values    Fluid Type-FTP Lung, Right     Comment: Performed at [facility], [address] [place], [place] [address]  Body fluid culture w Gram Stain     Status: None (Preliminary result)   Collection Time: 11/20/22  2:11 PM   Specimen: Lung, Right; Pleural Fluid  Result Value Ref Range   Specimen Description FLUID RIGHT PLEURAL    Special Requests NONE    Gram Stain      WBC PRESENT,BOTH PMN AND MONONUCLEAR NO ORGANISMS SEEN CYTOSPIN SMEAR Performed at [facility], [address], [place], [place] [address]    Culture PENDING    Report Status PENDING   CBG monitoring, ED     Status: Abnormal   Collection Time: 11/20/22  4:55 PM  Result Value Ref Range   Glucose-Capillary 193 (H) 70 - 99 mg/dL    Comment: Glucose reference range applies only to samples taken after fasting for at least  8 hours.   IR THORACENTESIS ASP PLEURAL SPACE W/IMG GUIDE  Result Date: 11/20/2022 INDICATION: Patient with history of recent right lower lobe segmentectomy presents with shortness of breath, CXR showed right pleural effusion. Request for therapeutic and diagnostic thoracentesis. EXAM: ULTRASOUND GUIDED RIGHT THORACENTESIS MEDICATIONS: 10 mL 1% lidocaine COMPLICATIONS: None immediate. PROCEDURE: An ultrasound guided thoracentesis was thoroughly discussed with the patient and questions answered. The benefits, risks, alternatives and complications were also discussed. The patient understands and wishes to proceed with the procedure. Written consent was obtained. Ultrasound was performed to localize and mark an adequate pocket of fluid in the right chest. The area was then prepped and draped in the normal sterile fashion. 1% Lidocaine was used for local anesthesia. Under ultrasound guidance a 6 Fr Safe-T-Centesis catheter was introduced. Thoracentesis was performed. The catheter was removed and a dressing applied. FINDINGS: A total of approximately 1 L of hazy amber fluid was removed. Samples were sent to the laboratory as requested by the clinical team. Post procedure chest X-ray reviewed, negative for pneumothorax. IMPRESSION: Successful ultrasound guided right thoracentesis yielding 1 L of pleural fluid. Performed by: Lawernce Ion, PA-C Electronically Signed   By: Corlis Leak M.D.   On: 11/20/2022 15:31   DG Chest 1 View  Result Date: 11/20/2022 CLINICAL DATA:  [id number] S/P thoracentesis [id number] EXAM: CHEST  1 VIEW COMPARISON:  Earlier film of the same day FINDINGS: No pneumothorax. Decrease in right pleural effusion with small residual. Improved aeration of right lung base. Left lung clear. Heart size and mediastinal contours are within normal limits. Aortic Atherosclerosis (ICD10-170.0). Right clavicle fixation hardware. IMPRESSION: Decreased right pleural effusion with improved aeration of the right lung base.  No pneumothorax. Electronically Signed  By: Corlis Leak M.D.   On: 11/20/2022 15:02   DG Chest 2 View  Result Date: 11/20/2022 CLINICAL DATA:  cp/ post lung surgery EXAM: CHEST - 2 VIEW COMPARISON:  CXR 10/30/22 FINDINGS: New right-sided pleural effusion with a superimposed hazy airspace opacities that could represent atelectasis or infection. Unchanged prominent cardiac contours. No radiographically apparent displaced rib fractures. Visualized upper abdomen is unremarkable. No pneumothorax. Right clavicular screw and plate fixation hardware. IMPRESSION: New right-sided pleural effusion with superimposed hazy airspace opacities that could represent atelectasis or infection. Electronically Signed   By: Lorenza Cambridge M.D.   On: 11/20/2022 11:21    Pending Labs Unresulted Labs (From admission, onward)     Start     Ordered   11/21/22 0500  HIV Antibody (routine testing w rflx)  (HIV Antibody (Routine testing w reflex) panel)  Tomorrow morning,   R        11/20/22 1333   11/21/22 0500  CBC  Tomorrow morning,   R        11/20/22 1333   11/21/22 0500  Comprehensive metabolic panel  Tomorrow morning,   R        11/20/22 1637   11/21/22 0500  Lactate dehydrogenase  Tomorrow morning,   R        11/20/22 1708   11/20/22 1708  Lactate dehydrogenase (pleural or peritoneal fluid)  Once,   R        11/20/22 1708   11/20/22 1411  Pathologist smear review  Once,   R        11/20/22 1411   11/20/22 1210  Blood culture (routine x 2)  BLOOD CULTURE X 2,   R (with STAT occurrences)      11/20/22 1213            Vitals/Pain Today's Vitals   11/20/22 1555 11/20/22 1609 11/20/22 1637 11/20/22 1828  BP:   (!) 97/54   Pulse:   96   Resp:   18   Temp: 98.3 F (36.8 C)     TempSrc: Oral     SpO2:   93%   Weight:  104.3 kg    Height:  5\' 9"  (1.753 m)    PainSc:    4     Isolation Precautions No active isolations  Medications Medications  enoxaparin (LOVENOX) injection 40 mg (has no  administration in time range)  insulin aspart (novoLOG) injection 0-15 Units (3 Units Subcutaneous Given 11/20/22 1721)  oxyCODONE (Oxy IR/ROXICODONE) immediate release tablet 5 mg (5 mg Oral Given 11/20/22 1607)  aspirin EC tablet 81 mg (81 mg Oral Given 11/20/22 1719)  atorvastatin (LIPITOR) tablet 40 mg (40 mg Oral Given 11/20/22 1719)  Budeson-Glycopyrrol-Formoterol 160-9-4.8 MCG/ACT AERO 2 puff (has no administration in time range)  buPROPion (WELLBUTRIN XL) 24 hr tablet 450 mg (has no administration in time range)  DULoxetine (CYMBALTA) DR capsule 60 mg (60 mg Oral Given 11/20/22 1719)  ezetimibe (ZETIA) tablet 10 mg (10 mg Oral Given 11/20/22 1719)  gabapentin (NEURONTIN) capsule 300 mg (has no administration in time range)  insulin glargine-yfgn (SEMGLEE) injection 70 Units (has no administration in time range)  metoprolol tartrate (LOPRESSOR) tablet 100 mg (has no administration in time range)  pantoprazole (PROTONIX) EC tablet 40 mg (40 mg Oral Given 11/20/22 1719)  tamsulosin (FLOMAX) capsule 0.4 mg (0.4 mg Oral Given 11/20/22 1719)  temazepam (RESTORIL) capsule 30 mg (has no administration in time range)  piperacillin-tazobactam (ZOSYN) IVPB 3.375 g (has  no administration in time range)  piperacillin-tazobactam (ZOSYN) IVPB 3.375 g (0 g Intravenous Stopped 11/20/22 1649)  fentaNYL (SUBLIMAZE) injection 50 mcg (50 mcg Intravenous Given 11/20/22 1322)  oxyCODONE (Oxy IR/ROXICODONE) immediate release tablet 5 mg (5 mg Oral Given 11/20/22 1356)  lidocaine (XYLOCAINE) 1 % (with pres) injection 20 mL (10 mLs Intradermal Given 11/20/22 1410)    Mobility walks     Focused Assessments Cardiac Assessment Handoff:  Cardiac Rhythm: Sinus tachycardia, Normal sinus rhythm Lab Results  Component Value Date   CKTOTAL 344 07/21/2017   CKMB 0.6 07/31/2013   TROPONINI <0.03 07/21/2017   Lab Results  Component Value Date   DDIMER 0.36 08/23/2022   Does the Patient currently have chest pain? No     R Recommendations: See Admitting Provider Note  Report given to:   Additional Notes: pt had a thoracentesis today I think they pull 1L off. bandaid on right back

## 2022-11-20 NOTE — ED Triage Notes (Signed)
Pt here from home with c//o sob and chest pain  , pt had surgery to the right lung on July 5th was feeling fine until 2 days ago

## 2022-11-21 ENCOUNTER — Other Ambulatory Visit: Payer: Self-pay | Admitting: Thoracic Surgery (Cardiothoracic Vascular Surgery)

## 2022-11-21 DIAGNOSIS — E1165 Type 2 diabetes mellitus with hyperglycemia: Secondary | ICD-10-CM | POA: Diagnosis not present

## 2022-11-21 DIAGNOSIS — J90 Pleural effusion, not elsewhere classified: Secondary | ICD-10-CM | POA: Diagnosis not present

## 2022-11-21 DIAGNOSIS — J449 Chronic obstructive pulmonary disease, unspecified: Secondary | ICD-10-CM | POA: Diagnosis not present

## 2022-11-21 DIAGNOSIS — C3431 Malignant neoplasm of lower lobe, right bronchus or lung: Secondary | ICD-10-CM | POA: Diagnosis not present

## 2022-11-21 DIAGNOSIS — C3491 Malignant neoplasm of unspecified part of right bronchus or lung: Secondary | ICD-10-CM

## 2022-11-21 DIAGNOSIS — Z794 Long term (current) use of insulin: Secondary | ICD-10-CM

## 2022-11-21 DIAGNOSIS — R911 Solitary pulmonary nodule: Secondary | ICD-10-CM

## 2022-11-21 DIAGNOSIS — F17210 Nicotine dependence, cigarettes, uncomplicated: Secondary | ICD-10-CM | POA: Diagnosis not present

## 2022-11-21 LAB — GLUCOSE, CAPILLARY
Glucose-Capillary: 114 mg/dL — ABNORMAL HIGH (ref 70–99)
Glucose-Capillary: 163 mg/dL — ABNORMAL HIGH (ref 70–99)
Glucose-Capillary: 234 mg/dL — ABNORMAL HIGH (ref 70–99)
Glucose-Capillary: 163 mg/dL — ABNORMAL HIGH (ref 70–99)
Glucose-Capillary: 173 mg/dL — ABNORMAL HIGH (ref 70–99)

## 2022-11-21 MED ORDER — LIDOCAINE 5 % EX PTCH: 1.0000 | MEDICATED_PATCH | CUTANEOUS | Status: DC

## 2022-11-21 MED ORDER — TEMAZEPAM 15 MG PO CAPS: 30.0000 mg | ORAL_CAPSULE | Freq: Every day | ORAL | Status: DC

## 2022-11-21 MED ORDER — DAPAGLIFLOZIN PROPANEDIOL 10 MG PO TABS
10.0000 mg | ORAL_TABLET | Freq: Every day | ORAL | Status: DC
  Administered 2022-11-22: 10 mg via ORAL
  Filled 2022-11-21 (×2): qty 1

## 2022-11-21 MED ORDER — KETOROLAC TROMETHAMINE 15 MG/ML IJ SOLN
15.0000 mg | Freq: Once | INTRAMUSCULAR | Status: AC
  Administered 2022-11-21: 15 mg via INTRAVENOUS
  Filled 2022-11-21: qty 1

## 2022-11-21 MED ORDER — ENOXAPARIN SODIUM 60 MG/0.6ML IJ SOSY
50.0000 mg | PREFILLED_SYRINGE | INTRAMUSCULAR | Status: DC
  Administered 2022-11-22: 50 mg via SUBCUTANEOUS
  Filled 2022-11-21 (×3): qty 0.6

## 2022-11-21 MED ORDER — FAMOTIDINE 20 MG PO TABS
20.0000 mg | ORAL_TABLET | Freq: Two times a day (BID) | ORAL | Status: DC
  Administered 2022-11-21 – 2022-11-22 (×3): 20 mg via ORAL
  Filled 2022-11-21 (×3): qty 1

## 2022-11-21 NOTE — Plan of Care (Signed)
Problem: Education: Goal: Knowledge of General Education information will improve Description: Including pain rating scale, medication(s)/side effects and non-pharmacologic comfort measures Outcome: Progressing Pt understands that he has admitting diagnosis of right pleural effusion s/p right lobectomy. He has a chief complaint of chest pain, fever and SOB.    Problem: Activity: Goal: Ability to tolerate increased activity will improve Outcome: Progressing Pt is independent of all his ADLs.  He was observed OOB ambulating with a steady gait.   Problem: Clinical Measurements: Goal: Ability to maintain a body temperature in the normal range will improve Outcome: Progressing Pt has remained afebrile thus far. He is on IV abx per MD's orders.    Problem: Respiratory: Goal: Ability to maintain adequate ventilation will improve Outcome: Progressing Respiratory status monitored and assessed q4 hours.  Pt is on room air with O2 saturations at 93-94% and respiration rate of 17-18 breaths per minute.  Pt has not endorsed c/o dyspnea, SOB and DOE.      Problem: Clinical Measurements: Goal: Will remain free from infection Outcome: Progressing S/Sx of infection monitored and assessed q4 hours.  Pt has remained afebrile thus far.  He is on IV abx per MD's orders.      Problem: Nutrition: Goal: Adequate nutrition will be maintained Outcome: Progressing Pt is on a carb modified diet.  He has been able to tolerate her diet w/o s/sx of abdominal pain, n/v or distention.   Problem: Pain Management: Goal: General experience of comfort will improve Outcome: Progressing Pt has endorsed c/o 7-9/10 right chest/ flank pain describing it as a constant sharp, shooting, ache.  Reiterated pain scale so he could adequately rate his pain.  Pt sated his pain goal would be 0/10.  Discussed nonpharmacological methods to help reduce s/sx of pain.  Interventions given per pt's request and MD's orders.    Problem:  Safety: Goal: Ability to remain free from injury will improve Outcome: Progressing Pt has remained free from falls thus far.  Instructed pt to utilize RN call light for assistance.  Hourly rounds performed.  Bed in lowest position, locked with two upper side rails engaged.  Belongings and call light within reach.   Problem: Skin Integrity: Goal: Risk for impaired skin integrity will decrease Outcome: Progressing  Skin integrity monitored and assessed q-shift.  Instructed pt to turn/ reposition himself q2 hours to prevent further skin impairment.  Tubes and drains assessed for device related pressure sores.  Pt is continent of both bowel and bladder.  Dressing changes performed per MD's orders.

## 2022-11-21 NOTE — Hospital Course (Addendum)
Right sided Exudative pleural effusion Leukocytosis Presented mild leukocytosis, progressively worsening dyspnea and chest pain, fever 101 at home. CXR positive new right-sided pleural effusion. IR consulted for thoracentesis, removed 1L of hazy amber fluid. Fluid studies showed WBC present, both PMN and mononuclear, no growth within 24hr yet. Fluid chemistry showed increase LDD 384, Total protein 5.5. WBC trending downwards from 12.6 to 10.8. LDH 108. Total serum protein 7.2, Albumin 2.9. Based on patient's fluid studies, per the lights criteria, likely has exudative non-loculated pleural effusion. Currently, waiting on further lab studies to consider either parapneumonia vs malignancy.  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 One left node had some atypical cells. He was to follow-up with CT surgery 07/31.  - Will reach out to patient's oncologist to see they want inpatient CT or plan outpatient upon discharge.  Chronic conditions managed: COPD/tobacco use disorder: Not in COPD exacerbation, continued home inhaler therapy with Breztri and Chantix. Diabetes: Continued Semglee 45 units at night, gabapentin 300 mg twice daily and Cymbalta 60 mg every day and patient was placed on sliding scale. Hypertension: Continue home medication metoprolol tartrate 100 mg twice daily. CAD: Continue home medication aspirin, atorvastatin 40 mg, acetamide 10 mg. PTSD: Continue home medication bupropion for 15 mg, duloxetine 60 mg, temazepam 30 mg at bedtime as needed GERD/Barrett's esophagus: Continue pantoprazole 40 mg BPH: Continued tamsulosin 0.4 mg  ----------------------------------------------------------------------------------------------------------  Patient instructions You came to the hospital for right-sided pleural effusion.  And we  treated you with IV antibiotics.     Follow-up appointments: Please visit your family doctor in 7 to 10 days  If you have any of these following symptoms, please call us or seek care at an emergency department: -Chest Pain -Difficulty Breathing -Worsening abdominal pain -Syncope (passing out) -Drooping of face -Slurred speech -Sudden weakness in your leg or arm -Fever -Chills -blood in the stool -dark black, sticky stool  We are glad that you are feeling better, it was a pleasure to care for you!  Jeral Pinch, DO, PGY1  ___________________________________________________________

## 2022-11-21 NOTE — Progress Notes (Addendum)
      [address]       [name] [address]             [phone]         Subjective: Pt states he is feeling okay but still has pain on his right side with deep breaths.  Objective: Vital signs in last 24 hours: Temp:  [98 F (36.7 C)-98.7 F (37.1 C)] 98.3 F (36.8 C) (07/30 0739) Pulse Rate:  [91-107] 95 (07/30 0933) Cardiac Rhythm: Sinus tachycardia;Normal sinus rhythm (07/29 1829) Resp:  [17-18] 17 (07/30 0739) BP: (97-129)/(54-87) 129/87 (07/30 0933) SpO2:  [93 %-95 %] 94 % (07/30 0745) Weight:  [103.2 kg-104.3 kg] 103.2 kg (07/30 0510)  Hemodynamic parameters for last 24 hours:    Intake/Output from previous day: 07/29 0701 - 07/30 0700 In: 120 [P.O.:120] Out: -  Intake/Output this shift: No intake/output data recorded.  General appearance: alert, cooperative, and no distress Neurologic: intact Heart: regular rate and rhythm, no murmur Lungs: Coarse lung sounds at right lower lobe, otherwise clear to auscultation  Extremities: extremities normal, atraumatic, no cyanosis or edema Wound: Healing well without sign of infection  Lab Results: Recent Labs    11/20/22 1016 11/20/22 2326  WBC 12.6* 10.8*  HGB 14.2 13.7  HCT 43.0 40.6  PLT 265 262   BMET:  Recent Labs    11/20/22 1016 11/20/22 2326  NA 130* 132*  K 4.1 3.9  CL 95* 94*  CO2 22 25  GLUCOSE 222* 136*  BUN 11 14  CREATININE 1.17 1.16  CALCIUM 8.9 8.9    PT/INR: No results for input(s): "LABPROT", "INR" in the last 72 hours. ABG    Component Value Date/Time   PHART 7.39 10/25/2022 1146   HCO3 23.6 10/25/2022 1146   ACIDBASEDEF 1.2 10/25/2022 1146   O2SAT 97 10/25/2022 1146   CBG (last 3)  Recent Labs    11/20/22 1655 11/20/22 2220 11/21/22 0932  GLUCAP 193* 166* 114*    Assessment/Plan: S/P  Neuro: Pt complains of incision site pain, on Oxycodone and Gabapentin per primary service  CV: Stable vital signs, NSR. BP controlled on Lopressor 100mg   BID.  Pulm: Pt arrived in the ED yesterday and was found to have a right sided pleural effusion and mild leukocytosis. He underwent a thoracentesis which yielded 1L of hazy amber fluid. Follow up CXR showed decreased right pleural effusion with improved lung aeration. Pt to get follow up CXR tomorrow. No further recommendations at this time.   ID: Leukocytosis trending down on IV piperacillin/tazobactam, primary service believes this is a non loculated pleural effusion based off of the fluid studies. Further studies pending for parapneumonic effusion vs effusion due to malignancy. Blood cultures pending.   Dispo: Pt overall looks well today, medicine team is following closely. Dr. Dorris Fetch was scheduled to see the patient as an outpatient tomorrow, will reschedule appointment.    LOS: 0 days    Jenny Reichmann, PA-C 11/21/2022

## 2022-11-21 NOTE — Plan of Care (Signed)
A/Ox4 and on room air. PRN oxy given x2 for right sided pleuritic chest pain. Self care.    Problem: Education: Goal: Knowledge of disease or condition will improve Outcome: Progressing Goal: Knowledge of the prescribed therapeutic regimen will improve Outcome: Progressing   Problem: Activity: Goal: Risk for activity intolerance will decrease Outcome: Progressing   Problem: Cardiac: Goal: Will achieve and/or maintain hemodynamic stability Outcome: Progressing   Problem: Clinical Measurements: Goal: Postoperative complications will be avoided or minimized Outcome: Progressing   Problem: Respiratory: Goal: Respiratory status will improve Outcome: Progressing   Problem: Pain Management: Goal: Pain level will decrease Outcome: Progressing   Problem: Skin Integrity: Goal: Wound healing without signs and symptoms infection will improve Outcome: Progressing   Problem: Education: Goal: Ability to describe self-care measures that may prevent or decrease complications (Diabetes Survival Skills Education) will improve Outcome: Progressing Goal: Individualized Educational Video(s) Outcome: Progressing   Problem: Coping: Goal: Ability to adjust to condition or change in health will improve Outcome: Progressing   Problem: Fluid Volume: Goal: Ability to maintain a balanced intake and output will improve Outcome: Progressing   Problem: Health Behavior/Discharge Planning: Goal: Ability to identify and utilize available resources and services will improve Outcome: Progressing Goal: Ability to manage health-related needs will improve Outcome: Progressing   Problem: Metabolic: Goal: Ability to maintain appropriate glucose levels will improve Outcome: Progressing   Problem: Nutritional: Goal: Maintenance of adequate nutrition will improve Outcome: Progressing Goal: Progress toward achieving an optimal weight will improve Outcome: Progressing   Problem: Skin Integrity: Goal:  Risk for impaired skin integrity will decrease Outcome: Progressing   Problem: Tissue Perfusion: Goal: Adequacy of tissue perfusion will improve Outcome: Progressing   Problem: Education: Goal: Knowledge of General Education information will improve Description: Including pain rating scale, medication(s)/side effects and non-pharmacologic comfort measures Outcome: Progressing   Problem: Health Behavior/Discharge Planning: Goal: Ability to manage health-related needs will improve Outcome: Progressing   Problem: Clinical Measurements: Goal: Ability to maintain clinical measurements within normal limits will improve Outcome: Progressing Goal: Will remain free from infection Outcome: Progressing Goal: Diagnostic test results will improve Outcome: Progressing Goal: Respiratory complications will improve Outcome: Progressing Goal: Cardiovascular complication will be avoided Outcome: Progressing   Problem: Activity: Goal: Risk for activity intolerance will decrease Outcome: Progressing   Problem: Nutrition: Goal: Adequate nutrition will be maintained Outcome: Progressing   Problem: Coping: Goal: Level of anxiety will decrease Outcome: Progressing   Problem: Elimination: Goal: Will not experience complications related to bowel motility Outcome: Progressing Goal: Will not experience complications related to urinary retention Outcome: Progressing   Problem: Pain Managment: Goal: General experience of comfort will improve Outcome: Progressing   Problem: Safety: Goal: Ability to remain free from injury will improve Outcome: Progressing   Problem: Skin Integrity: Goal: Risk for impaired skin integrity will decrease Outcome: Progressing

## 2022-11-21 NOTE — Progress Notes (Signed)
HD#0 SUBJECTIVE:  Patient Summary: Raymond [name] is a 57 y.o. male with past medical history of tobacco use disorder, emphysema, diabetes, recent RLL superior segmentectomy for a RLL pulmonary nodule on 07/05 that came back as lung adenocarcinoma. He was discharged from hospital on 07/08 and followed up with pulmonology on 07/15. He presented with dyspnea and pleuritic chest pain for the last 3 to 4 days, admitted for right sided pleural effusion.  Overnight Events: None  Interim History:   Patient evaluated bedside. Patient reports pain at the right subcostal region and right lateral side.  Patient reports pain is worse with cough, movement, deep inhalation.  Denies any recent sickness or sick contacts or hospitalization. Reports wet cough with clear sputum production that started after his procedure for the lung biopsy. Denies any recent fevers or chills. Denies any acute chest pains, abdominal pain.    OBJECTIVE:  Vital Signs: Vitals:   11/20/22 2226 11/21/22 0510 11/21/22 0739 11/21/22 0745  BP: 124/81 112/78 109/73   Pulse: (!) 107 (!) 101 91   Resp:  18 17   Temp:  98.7 F (37.1 C) 98.3 F (36.8 C)   TempSrc:  Oral Oral   SpO2:  94% 93% 94%  Weight:  103.2 kg    Height:       Supplemental O2: Room Air SpO2: 94 %  Filed Weights   11/20/22 1609 11/21/22 0510  Weight: 104.3 kg 103.2 kg     Intake/Output Summary (Last 24 hours) at 11/21/2022 0749 Last data filed at 11/20/2022 2025 Gross per 24 hour  Intake 120 ml  Output --  Net 120 ml   Net IO Since Admission: 120 mL [11/21/22 0749]  Physical Exam: Physical Exam Constitutional:      General: He is not in acute distress. HENT:     Head: Normocephalic and atraumatic.  Cardiovascular:     Rate and Rhythm: Normal rate and regular rhythm.     Heart sounds: No murmur heard. Pulmonary:     Comments: + crackles in the RLL, +shallow effort due to pain  Abdominal:     General: Bowel sounds are normal.      Palpations: Abdomen is soft.     Tenderness: There is no abdominal tenderness.  Musculoskeletal:        General: Normal range of motion.  Skin:    General: Skin is warm and dry.  Neurological:     General: No focal deficit present.     Mental Status: He is alert.  Psychiatric:        Mood and Affect: Mood normal.        Behavior: Behavior normal.     Patient Lines/Drains/Airways Status     Active Line/Drains/Airways     Name Placement date Placement time Site Days   Peripheral IV 11/20/22 20 G 1" Anterior;Distal;Right;Upper Arm 11/20/22  1321  Arm  1   Incision - 4 Ports Abdomen Right;Upper Right Mid;Upper Umbilicus 01/23/19  1500  -- 1398             ASSESSMENT/PLAN:  Assessment: Principal Problem:   Pleural effusion on right   Plan: Right sided Exudative pleural effusion Leukocytosis - Presented mild leukocytosis, progressively worsening dyspnea and chest pain, fever 101 at home. - CXR positive new right-sided pleural effusion  - IR consulted for thoracentesis, removed 1L of hazy amber fluid. - Fluid studies showed WBC present, both PMN and mononuclear, no growth within 24hr yet. Fluid chemistry showed increase LDD  384, Total protein 5.5. - WBC trending downwards from 12.6 to 10.8. LDH 108. Total serum protein 7.2, Albumin 2.9.  - blood cx pending - Based on patient's fluid studies, likely has exudative non-loculated pleural effusion. Currently, waiting on further lab studies to consider either parapneumonia vs malignancy.  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 One left node had some atypical cells. He was to follow-up with CT surgery 07/31.  - Will reach out to patient's oncologist to see they want inpatient CT or plan outpatient upon discharge.   COPD Tobacco use disorder No wheezing appreciated on exam, he is  not in COPD exacerbation. Will continue home inhaler therapy with Breztri 160-9-4.8. He is interested in smoking cessation and recently started taking chantix. He is not interested in nicotine patch while in hospital.   DM Last A1c at 8.8 10/25/22. Medications include toujeo glargine 70 units at bedtime. He is prescribed but does not take PO semaglutide and SGLT2 as they make him feel unwell. He uses CGM at home to monitor blood sugars. -Continue Semglee 55 units qhs -gabapentin 300 mg BID and Cymbalta 60 mg qd -SSI -Carb modified diet started - POCT glucose checks    HTN Medications include Metoprolol tartrate 100 mg BID. Unclear why he is not on first line therapy for HTN.    CAD Home medications include Asa 81 mg, Atorvastatin 40 mg, and Ezetimibe 10 mg. Last LDL at goal in 2023 for secondary prevention.   PTSD He follows with psychiatry. Home medications include Bupropion 450 mg XL, Duloxetine 60 mg every day and Temazepam 30 mg at bedtime prn (PDMP reviewed)   GERD Barretts esophagus Continue Pantoprazole 40 mg qd   BPH Continue Tamsulosin 0.4 mg.  Best Practice: Diet: Cardiac diet IVF: None VTE: Enoxaparin  Code: Full DISPO: Anticipated discharge  couple of days  to Home pending  Medical management .  Signature: Jeral Pinch, D.O.  Internal Medicine Resident, PGY-1 [facility]  Pager: [phone] 7:49 AM, 11/21/2022   Please contact the on call pager after 5 pm and on weekends at [phone].

## 2022-11-21 NOTE — Progress Notes (Signed)
Pt transferred from 2W Bed 22 for downgrade in care.  He has an admitting diagnosis of right pleural effusion s/p right lobectomy.  Pt had a chief complaint of chest pain, fever and SOB.  He was transported via wheelchair accompanied by a Engineer, drilling and 1 transporter.  Assisted pt into his bed.  Pt walked to her bed with a steady gait.  Reviewed POC with pt and made him aware of his surroundings.  Obtained VS which were WNL.  Answered any pending questions.  Pt had no further questions.  Instructed pt to utilize RN call light for assistance.  Bed locked in lowest position, with two upper side rails engaged.  Belongings and call light within reach.

## 2022-11-22 ENCOUNTER — Ambulatory Visit: Payer: Self-pay | Admitting: Thoracic Surgery (Cardiothoracic Vascular Surgery)

## 2022-11-22 ENCOUNTER — Observation Stay (HOSPITAL_COMMUNITY): Payer: 59

## 2022-11-22 DIAGNOSIS — Z794 Long term (current) use of insulin: Secondary | ICD-10-CM | POA: Diagnosis not present

## 2022-11-22 DIAGNOSIS — E1165 Type 2 diabetes mellitus with hyperglycemia: Secondary | ICD-10-CM | POA: Diagnosis not present

## 2022-11-22 DIAGNOSIS — C3431 Malignant neoplasm of lower lobe, right bronchus or lung: Secondary | ICD-10-CM | POA: Diagnosis not present

## 2022-11-22 DIAGNOSIS — J9811 Atelectasis: Secondary | ICD-10-CM | POA: Diagnosis not present

## 2022-11-22 DIAGNOSIS — J90 Pleural effusion, not elsewhere classified: Secondary | ICD-10-CM | POA: Diagnosis not present

## 2022-11-22 LAB — BASIC METABOLIC PANEL WITH GFR
Anion gap: 13 (ref 5–15)
BUN: 21 mg/dL — ABNORMAL HIGH (ref 6–20)
CO2: 23 mmol/L (ref 22–32)
Calcium: 8.6 mg/dL — ABNORMAL LOW (ref 8.9–10.3)
Chloride: 94 mmol/L — ABNORMAL LOW (ref 98–111)
Creatinine, Ser: 1.24 mg/dL (ref 0.61–1.24)
GFR, Estimated: >60 mL/min
Glucose, Bld: 206 mg/dL — ABNORMAL HIGH (ref 70–99)
Potassium: 3.9 mmol/L (ref 3.5–5.1)
Sodium: 130 mmol/L — ABNORMAL LOW (ref 135–145)

## 2022-11-22 LAB — CBC
HCT: 37 % — ABNORMAL LOW (ref 39.0–52.0)
Hemoglobin: 12.3 g/dL — ABNORMAL LOW (ref 13.0–17.0)
MCH: 29.1 pg (ref 26.0–34.0)
MCHC: 33.2 g/dL (ref 30.0–36.0)
MCV: 87.5 fL (ref 80.0–100.0)
Platelets: 240 K/uL (ref 150–400)
RBC: 4.23 MIL/uL (ref 4.22–5.81)
RDW: 12.7 % (ref 11.5–15.5)
WBC: 10.6 K/uL — ABNORMAL HIGH (ref 4.0–10.5)
nRBC: 0 % (ref 0.0–0.2)

## 2022-11-22 LAB — GLUCOSE, CAPILLARY
Glucose-Capillary: 141 mg/dL — ABNORMAL HIGH (ref 70–99)
Glucose-Capillary: 156 mg/dL — ABNORMAL HIGH (ref 70–99)
Glucose-Capillary: 172 mg/dL — ABNORMAL HIGH (ref 70–99)

## 2022-11-22 MED ORDER — LIDOCAINE 5 % EX PTCH: 1.0000 | MEDICATED_PATCH | CUTANEOUS | 0 refills | Status: DC

## 2022-11-22 MED ORDER — GABAPENTIN 300 MG PO CAPS: 300.0000 mg | ORAL_CAPSULE | Freq: Three times a day (TID) | ORAL | Status: DC

## 2022-11-22 MED ORDER — AMOXICILLIN-POT CLAVULANATE 875-125 MG PO TABS: 1.0000 | ORAL_TABLET | Freq: Two times a day (BID) | ORAL | 0 refills | Status: AC

## 2022-11-22 MED ORDER — ACETAMINOPHEN 500 MG PO TABS
1000.0000 mg | ORAL_TABLET | Freq: Four times a day (QID) | ORAL | Status: DC
  Administered 2022-11-22: 1000 mg via ORAL
  Filled 2022-11-22: qty 2

## 2022-11-22 MED ORDER — ACETAMINOPHEN 500 MG PO TABS: 1000.0000 mg | ORAL_TABLET | Freq: Four times a day (QID) | ORAL | 0 refills | Status: AC

## 2022-11-22 MED ORDER — GABAPENTIN 300 MG PO CAPS: 300.0000 mg | ORAL_CAPSULE | Freq: Three times a day (TID) | ORAL | 0 refills | Status: DC

## 2022-11-22 MED ORDER — METHOCARBAMOL 500 MG PO TABS: 500.0000 mg | ORAL_TABLET | Freq: Three times a day (TID) | ORAL | 0 refills | Status: AC | PRN

## 2022-11-22 MED ORDER — METHOCARBAMOL 500 MG PO TABS
500.0000 mg | ORAL_TABLET | Freq: Three times a day (TID) | ORAL | Status: DC | PRN
  Administered 2022-11-22: 500 mg via ORAL
  Filled 2022-11-22: qty 1

## 2022-11-22 MED ORDER — OXYCODONE HCL 5 MG PO TABS: 5.0000 mg | ORAL_TABLET | Freq: Three times a day (TID) | ORAL | 0 refills | Status: DC | PRN

## 2022-11-22 NOTE — Progress Notes (Signed)
      [address]       [name] [address]             [phone]       Subjective: Still has pain with deep inspiration, no shortness of breath Hoping to go home  Objective: Vital signs in last 24 hours: Temp:  [98.5 F (36.9 C)-99.1 F (37.3 C)] 98.8 F (37.1 C) (07/31 0751) Pulse Rate:  [85-104] 104 (07/31 0751) Resp:  [18] 18 (07/31 0751) BP: (97-135)/(65-79) 133/75 (07/31 0751) SpO2:  [93 %-95 %] 95 % (07/31 0751)  Hemodynamic parameters for last 24 hours:    Intake/Output from previous day: 07/30 0701 - 07/31 0700 In: 339.9 [P.O.:240; IV Piggyback:99.9] Out: -  Intake/Output this shift: No intake/output data recorded.  General appearance: alert, cooperative, and no distress Neurologic: intact Heart: regular rate and rhythm Lungs: diminished breath sounds right base Wound: clean and intact  Lab Results: Recent Labs    11/20/22 2326 11/22/22 0235  WBC 10.8* 10.6*  HGB 13.7 12.3*  HCT 40.6 37.0*  PLT 262 240   BMET:  Recent Labs    11/20/22 2326 11/22/22 0235  NA 132* 130*  K 3.9 3.9  CL 94* 94*  CO2 25 23  GLUCOSE 136* 206*  BUN 14 21*  CREATININE 1.16 1.24  CALCIUM 8.9 8.6*    PT/INR: No results for input(s): "LABPROT", "INR" in the last 72 hours. ABG    Component Value Date/Time   PHART 7.39 10/25/2022 1146   HCO3 23.6 10/25/2022 1146   ACIDBASEDEF 1.2 10/25/2022 1146   O2SAT 97 10/25/2022 1146   CBG (last 3)  Recent Labs    11/21/22 2300 11/22/22 0009 11/22/22 0754  GLUCAP 173* 172* 156*    Assessment/Plan:  Mr. Parziale underwent a right lower lobe superior segmentectomy on 10/27/2022 Presented back with pleuritic pain and a pleural effusion- exudative as is common after lung surgery. Treated empirically for pneumonia. Has good sat on room air and feels better Still with intercostal neuralgia pain I adjusted pain regimen- Will give standing Tylenol (itching with hydrocodone but not with plain Tylenol),  increase gabapentin to TID, add Robaxin PRN and continue PRN oxycodone. I think he would be fine to complete antibiotics Po and go home on the above pain regimen Effusions typically respond to prednisone taper  Viviann Spare C. Dorris Fetch, MD [facility] [phone]    LOS: 0 days    Loreli Slot 11/22/2022

## 2022-11-22 NOTE — Discharge Summary (Signed)
Name: Raymond [name] MRN: [id number] DOB: 11-23-1965 57 y.o. PCP: Sallyanne Kuster, NP  Date of Admission: 11/20/2022 10:03 AM Date of Discharge:  11/22/2022 Attending Physician: Dr.  Lafonda Mosses   DISCHARGE DIAGNOSIS:  Primary Problem: Pleural effusion on right   Hospital Problems: Principal Problem:   Pleural effusion on right Active Problems:   Primary adenocarcinoma of right lung (HCC)   Type 2 diabetes mellitus with hyperglycemia, with long-term current use of insulin (HCC)    DISCHARGE MEDICATIONS:   Allergies as of 11/22/2022       Reactions   Onion Anaphylaxis   Norco [hydrocodone-acetaminophen] Itching   Nickel Rash   Nicoderm [nicotine] Rash        Medication List     STOP taking these medications    cyclobenzaprine 10 MG tablet Commonly known as: FLEXERIL       TAKE these medications    Accu-Chek Aviva Plus test strip Generic drug: glucose blood Blood sugar testing TID and as needed. E11.65   Accu-Chek FastClix Lancets Misc yse as directed twice a day   acetaminophen 500 MG tablet Commonly known as: TYLENOL Take 2 tablets (1,000 mg total) by mouth 4 (four) times daily. What changed:  when to take this reasons to take this   albuterol 108 (90 Base) MCG/ACT inhaler Commonly known as: VENTOLIN HFA INHALE 2 PUFFS INTO THE LUNGS EVERY 6 HOURS AS MEEDED FOR WHEEZING OR SHORTNESS OF BREATH   amoxicillin-clavulanate 875-125 MG tablet Commonly known as: AUGMENTIN Take 1 tablet by mouth 2 (two) times daily for 14 days.   aspirin EC 81 MG tablet Take 81 mg by mouth daily at 3 pm.   atorvastatin 40 MG tablet Commonly known as: LIPITOR Take 1 tablet (40 mg total) by mouth daily.   Breztri Aerosphere 160-9-4.8 MCG/ACT Aero Generic drug: Budeson-Glycopyrrol-Formoterol INHALE 2 PUFFS TWICE DAILY   buPROPion 300 MG 24 hr tablet Commonly known as: Wellbutrin XL Take 1 tablet (300 mg total) by mouth daily. Take along with 150 mg daily    buPROPion 150 MG 24 hr tablet Commonly known as: Wellbutrin XL Take 1 tablet (150 mg total) by mouth daily. Take along with 300 mg daily   dapagliflozin propanediol 10 MG Tabs tablet Commonly known as: Farxiga Take 1 tablet (10 mg total) by mouth daily before breakfast.   DULoxetine 60 MG capsule Commonly known as: CYMBALTA Take 1 capsule (60 mg total) by mouth daily. Take along with 30 mg daily What changed: Another medication with the same name was removed. Continue taking this medication, and follow the directions you see here.   ezetimibe 10 MG tablet Commonly known as: ZETIA Take 1 tablet (10 mg total) by mouth daily.   famotidine 20 MG tablet Commonly known as: PEPCID Take 1 tablet (20 mg total) by mouth 2 (two) times daily.   FreeStyle Libre 3 Sensor Misc Apply 1 sensor to the skin every 14 days   gabapentin 300 MG capsule Commonly known as: NEURONTIN Take 1 capsule (300 mg total) by mouth 3 (three) times daily. What changed:  medication strength how much to take when to take this Another medication with the same name was removed. Continue taking this medication, and follow the directions you see here.   Gemtesa 75 MG Tabs Generic drug: Vibegron Take 1 tablet (75 mg total) by mouth daily.   Insulin Pen Needle 32G X 4 MM Misc Use with daily dose insulin and with sliding scale insulin as needed.   lidocaine  5 % Commonly known as: LIDODERM Place 1 patch onto the skin daily. Remove & Discard patch within 12 hours or as directed by MD   Melatonin 10 MG Caps Take 30 mg by mouth at bedtime.   methocarbamol 500 MG tablet Commonly known as: ROBAXIN Take 1 tablet (500 mg total) by mouth every 8 (eight) hours as needed for up to 3 days for muscle spasms.   metoprolol tartrate 100 MG tablet Commonly known as: LOPRESSOR Take 1 tablet (100 mg total) by mouth 2 (two) times daily.   nitroGLYCERIN 0.4 MG SL tablet Commonly known as: NITROSTAT Place 1 tablet (0.4 mg  total) under the tongue every 5 (five) minutes x 3 doses as needed for chest pain.   oxyCODONE 5 MG immediate release tablet Commonly known as: Oxy IR/ROXICODONE Take 1 tablet (5 mg total) by mouth every 8 (eight) hours as needed for severe pain. What changed:  when to take this reasons to take this   pantoprazole 40 MG tablet Commonly known as: PROTONIX Take 1 tablet (40 mg total) by mouth daily.   Semaglutide 7 MG Tabs Take 1 tablet (7 mg total) by mouth daily before breakfast. On empty stomach with no more than 4 oz of water, wait 30 min before eating or drinking.   tamsulosin 0.4 MG Caps capsule Commonly known as: FLOMAX Take 1 capsule (0.4 mg total) by mouth daily.   temazepam 30 MG capsule Commonly known as: RESTORIL TAKE 1 CAPSULE BY MOUTH AT BEDTIME AS NEEDED FOR SLEEP What changed: See the new instructions.   Toujeo Max SoloStar 300 UNIT/ML Solostar Pen Generic drug: insulin glargine (2 Unit Dial) Inject 70 Units into the skin daily.   Varenicline Tartrate (Starter) 0.5 MG X 11 & 1 MG X 42 Tbpk Take 0.5 mg by mouth once daily on days 1-3, then take 0.5 mg twice daily on days 4-7, then increase to 1 mg twice daily        DISPOSITION AND FOLLOW-UP:  Raymond [name] was discharged from [facility] in Stable condition. At the hospital follow up visit please address:  Right-sided exudative pleural effusion: Patient is started on p.o. Augmentin 875-125 mg 1 tablet twice a day for 14 days. We also started him on pain regimen to alleviate the discomfort he is having with inhalation and movement. He is on Robaxin 500 mg q8h for 3 days, Oxycodone 5mg  q8h prn, and Tylenol 500 2 tab four times a day as needed. Please address his pleural effusion.  Right lower lobe adenocarcinoma: CT surgery is following. We did not get an inpatient CT chest, however had CXR that showed slight decrease in pleural fluid in right lung base. Please follow up as it is convenient  for the patient.    Follow-up Recommendations: Consults: Cardiothoracic Surgery, PCP Labs: CBC  Studies: CT Chest  Medications: Augmentin 875-125 mg, Robaxin 500 mg q8h prn, oxycodone 5 mg q4h prn, and tylenol 500 mg 2 tablet 4x day prn  Follow-up Appointments:  Follow-up Information     Loreli Slot, MD Follow up on 12/12/2022.   Specialty: Cardiothoracic Surgery Why: Appointment is at 10:00AM Contact information: [address] [place] [address] [phone]         [facility] Follow up on 12/12/2022.   Why: To get a chest xray at 9:00AM Contact information: [address] [place] [address]  HOSPITAL COURSE:  Patient Summary: Right sided Exudative pleural effusion Leukocytosis This patient presented with mild leukocytosis, progressively worsening dyspnea and chest pain, fever 101 at home. CXR positive new right-sided pleural effusion. IR consulted for thoracentesis, removed 1L of hazy amber fluid. Fluid studies showed WBC present, both PMN and mononuclear, no growth in blood cultures and fluid cultures within 48hr. Fluid chemistry showed increase LDH 384, Total protein 5.5. WBC trending downwards. LDH 108. Total serum protein 7.2, Albumin 2.9. Based on patient's fluid studies, per the lights criteria, likely had exudative non-loculated pleural effusion that was treated with IV Zosyn. CT surgery followed and reported that post-procedure, it is common for patients to develop pleural effusion. At this time, patient is transitioned to PO Augmentin for 14 days with a good pain regimen medications.    Right lower lobe adenocarcinoma diagnosed on thoracoscopy 7/5 Path consistent with moderately differentiated adenocarcinoma. One left node had some atypical cells. He initially had CT chest planned for 7/31, however patient was inpatient, therefore, had CXR and will plan for CT chest as outpatient.    Chronic conditions managed: COPD/tobacco use disorder: Not in COPD exacerbation, continued home inhaler therapy with Breztri and Chantix. Diabetes: Continued Semglee 45 units at night, gabapentin 300 mg twice daily and Cymbalta 60 mg every day and patient was placed on sliding scale. Hypertension: Continue home medication metoprolol tartrate 100 mg twice daily. CAD: Continue home medication aspirin, atorvastatin 40 mg, acetamide 10 mg. PTSD: Continue home medication bupropion for 15 mg, duloxetine 60 mg, temazepam 30 mg at bedtime as needed GERD/Barrett's esophagus: Continue pantoprazole 40 mg BPH: Continued tamsulosin 0.4 mg   DISCHARGE INSTRUCTIONS:   Discharge Instructions     Diet - low sodium heart healthy   Complete by: As directed    Discharge instructions   Complete by: As directed    You came to the hospital for right-sided pleural effusion.  And we treated you with IV antibiotics and pain medications.   1. For your right sided pleural effusion We have started you on the following medications to take.  - Augmentin 875-125 mg, take 1 tablet by mouth twice a day for 14 days. This is your antibiotic. Please take it for two weeks.  - Robaxin 500 mg, take 1 tablet every 8 hours for 3 days as needed for pain. This is a muscle relaxer.  - Tylenol 500 mg, take 2 tablets every 4 hours as need for pain management - Oxycodone 5mg , take 1 tablet every 8 hours as needed for pain.    2. Right lower lobe adenocarcinoma Please follow up with Cardiothoracic surgery as needed. Please call them and schedule a follow up appointment with them.    Otherwise, please continue to take your medications as prescribed for your chronic conditions.   Please follow up with your Primary Care Provider in about couple of weeks.   If you have any of these following symptoms, please call us or seek care at an emergency department: -Chest Pain -Difficulty Breathing -Worsening abdominal pain -Syncope  (passing out) -Drooping of face -Slurred speech -Sudden weakness in your leg or arm -Fever -Chills -blood in the stool -dark black, sticky stool  We are glad that you are feeling better, it was a pleasure to care for you!  Jeral Pinch, DO, PGY1   Increase activity slowly   Complete by: As directed        SUBJECTIVE:   Patient evaluated at bedside continues to complain about right lower chest  pain and right lateral pain that is worsen with cough, movement, and deep inhalation. Patient reports trouble breathing that has not changed since hospital admission. Otherwise denies any recent fevers, chills, chest pain, or abdominal pain.    Discharge Vitals:   BP 133/75 (BP Location: Left Arm)   Pulse (!) 104   Temp 98.8 F (37.1 C) (Oral)   Resp 18   Ht 5\' 9"  (1.753 m)   Wt 103.2 kg   SpO2 95%   BMI 33.60 kg/m   OBJECTIVE:  Physical Exam   Constitutional: well-appearing, NAD, Alert, conversing appropriately   Cardiovascular: regular rate and rhythm, no m/r/g Pulmonary/Chest: Decrease work of breathing due to pain. Decreased breath sounds on the Right lower lobe.  Abdominal: soft, non-tender, non-distended, 2+ BS MSK: sites from recent thoracoscopy healing well without signs of infection. Normal ROM. Neuro: No focal deficits. Alert and oriented to name, location and event. Psych: Normal mood and affect.  Pertinent Labs, Studies, and Procedures:     Latest Ref Rng & Units 11/22/2022    2:35 AM 11/20/2022   11:26 PM 11/20/2022   10:16 AM  CBC  WBC 4.0 - 10.5 K/uL 10.6  10.8  12.6   Hemoglobin 13.0 - 17.0 g/dL 08.6  57.8  46.9   Hematocrit 39.0 - 52.0 % 37.0  40.6  43.0   Platelets 150 - 400 K/uL 240  262  265        Latest Ref Rng & Units 11/22/2022    2:35 AM 11/20/2022   11:26 PM 11/20/2022   10:16 AM  CMP  Glucose 70 - 99 mg/dL 629  528  413   BUN 6 - 20 mg/dL 21  14  11    Creatinine 0.61 - 1.24 mg/dL 2.44  0.10  2.72   Sodium 135 - 145 mmol/L 130  132  130    Potassium 3.5 - 5.1 mmol/L 3.9  3.9  4.1   Chloride 98 - 111 mmol/L 94  94  95   CO2 22 - 32 mmol/L 23  25  22    Calcium 8.9 - 10.3 mg/dL 8.6  8.9  8.9   Total Protein 6.5 - 8.1 g/dL  7.2    Total Bilirubin 0.3 - 1.2 mg/dL  0.7    Alkaline Phos 38 - 126 U/L  111    AST 15 - 41 U/L  13    ALT 0 - 44 U/L  16      IR THORACENTESIS ASP PLEURAL SPACE W/IMG GUIDE  Result Date: 11/20/2022 INDICATION: Patient with history of recent right lower lobe segmentectomy presents with shortness of breath, CXR showed right pleural effusion. Request for therapeutic and diagnostic thoracentesis. EXAM: ULTRASOUND GUIDED RIGHT THORACENTESIS MEDICATIONS: 10 mL 1% lidocaine COMPLICATIONS: None immediate. PROCEDURE: An ultrasound guided thoracentesis was thoroughly discussed with the patient and questions answered. The benefits, risks, alternatives and complications were also discussed. The patient understands and wishes to proceed with the procedure. Written consent was obtained. Ultrasound was performed to localize and mark an adequate pocket of fluid in the right chest. The area was then prepped and draped in the normal sterile fashion. 1% Lidocaine was used for local anesthesia. Under ultrasound guidance a 6 Fr Safe-T-Centesis catheter was introduced. Thoracentesis was performed. The catheter was removed and a dressing applied. FINDINGS: A total of approximately 1 L of hazy amber fluid was removed. Samples were sent to the laboratory as requested by the clinical team. Post procedure chest X-ray reviewed, negative  for pneumothorax. IMPRESSION: Successful ultrasound guided right thoracentesis yielding 1 L of pleural fluid. Performed by: Lawernce Ion, PA-C Electronically Signed   By: Corlis Leak M.D.   On: 11/20/2022 15:31   DG Chest 1 View  Result Date: 11/20/2022 CLINICAL DATA:  [id number] S/P thoracentesis [id number] EXAM: CHEST  1 VIEW COMPARISON:  Earlier film of the same day FINDINGS: No pneumothorax. Decrease in right pleural  effusion with small residual. Improved aeration of right lung base. Left lung clear. Heart size and mediastinal contours are within normal limits. Aortic Atherosclerosis (ICD10-170.0). Right clavicle fixation hardware. IMPRESSION: Decreased right pleural effusion with improved aeration of the right lung base. No pneumothorax. Electronically Signed   By: Corlis Leak M.D.   On: 11/20/2022 15:02   DG Chest 2 View  Result Date: 11/20/2022 CLINICAL DATA:  cp/ post lung surgery EXAM: CHEST - 2 VIEW COMPARISON:  CXR 10/30/22 FINDINGS: New right-sided pleural effusion with a superimposed hazy airspace opacities that could represent atelectasis or infection. Unchanged prominent cardiac contours. No radiographically apparent displaced rib fractures. Visualized upper abdomen is unremarkable. No pneumothorax. Right clavicular screw and plate fixation hardware. IMPRESSION: New right-sided pleural effusion with superimposed hazy airspace opacities that could represent atelectasis or infection. Electronically Signed   By: Lorenza Cambridge M.D.   On: 11/20/2022 11:21     Signed: Jeral Pinch, D.O.  Internal Medicine Resident, PGY-1 [facility]  Pager: [phone] 3:15 PM, 11/22/2022

## 2022-11-22 NOTE — TOC CM/SW Note (Signed)
Transition of Care [facility]) - Inpatient Brief Assessment   Patient Details  Name: Raymond [name] MRN: [id number] Date of Birth: [birth year]  Transition of Care [facility]) CM/SW Contact:    Harriet Masson, RN Phone Number: 11/22/2022, 2:20 PM   Clinical Narrative: Patient has adenocarcinoma of right lower lobe after recent thoracoscopy 7/5 who presented with dyspnea and pleuritic chest pain. TOC following.    Transition of Care Asessment: Insurance and Status: Insurance coverage has been reviewed Patient has primary care physician: Yes Home environment has been reviewed: safe to discharge home Prior level of function:: independent Prior/Current Home Services: No current home services Social Determinants of Health Reivew: SDOH reviewed no interventions necessary Readmission risk has been reviewed: Yes Transition of care needs: no transition of care needs at this time

## 2022-11-27 ENCOUNTER — Ambulatory Visit: Payer: 59 | Admitting: Nurse Practitioner

## 2022-11-27 ENCOUNTER — Encounter: Payer: Self-pay | Admitting: Nurse Practitioner

## 2022-11-27 VITALS — BP 128/76 | HR 87 | Temp 98.4°F | Resp 16 | Ht 69.0 in | Wt 229.6 lb

## 2022-11-27 DIAGNOSIS — J90 Pleural effusion, not elsewhere classified: Secondary | ICD-10-CM

## 2022-11-27 DIAGNOSIS — Z09 Encounter for follow-up examination after completed treatment for conditions other than malignant neoplasm: Secondary | ICD-10-CM

## 2022-11-27 NOTE — Progress Notes (Signed)
[facility] [place], [place] [address] [place] [address] [phone]                                   Transitional Care Clinic   [facility] Discharge Acute Issues Care Follow Up                                                                        Patient Demographics  Raymond [name], is a 57 y.o. male  DOB Sep 14, 1965  MRN [id number].  Primary MD  Raymond Kuster, NP  Date of Admission: 11/20/2022 10:03 AM Date of Discharge:  11/22/2022  Reason for TCC follow Up - Right pleural effusion    Past Medical History:  Diagnosis Date   Anxiety    Arthritis    NECK   Bronchitis    Coronary artery disease    Depression    Diabetes mellitus without complication (HCC)    Diastasis of rectus abdominis 06/19/2018   Dyspnea    DUE TO GALLBLADDER PER PT   Dysrhythmia    "fast hear rate at times. stopped when placed on metoprolol"   Emphysema lung (HCC)    Family history of adverse reaction to anesthesia    N/V, TROUBLE BREATHING COMING OUT OF ANESTHESIA   Fatty liver    GERD (gastroesophageal reflux disease)    Headache    MIGRAINES   Hyperlipidemia    Hypertension    Irregular heart beat unk   Myocardial infarction (HCC) 2010   MILD    PTSD (post-traumatic stress disorder)    Sleep apnea    USES CPAP   Tachycardia    Ventral hernia     Past Surgical History:  Procedure Laterality Date    ingrown toenail removal     CHOLECYSTECTOMY N/A 01/23/2019   Procedure: LAPAROSCOPIC CHOLECYSTECTOMY;  Surgeon: Raymond Dodge, MD;  Location: [facility] ORS;  Service: General;  Laterality: N/A;   CLAVICLE SURGERY Right    COLONOSCOPY  2017   COLONOSCOPY WITH PROPOFOL N/A 08/25/2020   Procedure: COLONOSCOPY WITH PROPOFOL;  Surgeon: Raymond Spillers, MD;  Location: [facility] ENDOSCOPY;  Service: Endoscopy;  Laterality: N/A;   ESOPHAGOGASTRODUODENOSCOPY (EGD) WITH PROPOFOL N/A 08/25/2020   Procedure: ESOPHAGOGASTRODUODENOSCOPY (EGD) WITH PROPOFOL;  Surgeon:  Raymond Spillers, MD;  Location: [facility] ENDOSCOPY;  Service: Endoscopy;  Laterality: N/A;   IR THORACENTESIS ASP PLEURAL SPACE W/IMG GUIDE  11/20/2022   LYMPH NODE DISSECTION Right 10/27/2022   Procedure: LYMPH NODE DISSECTION;  Surgeon: Raymond Slot, MD;  Location: [facility] OR;  Service: Thoracic;  Laterality: Right;   MOUTH SURGERY     REMOVED ALL TEETH   SHOULDER SURGERY Right    UPPER GI ENDOSCOPY  2017   XI ROBOTIC ASSISTED THORACOSCOPY- SEGMENTECTOMY Right 10/27/2022   Procedure: XI ROBOTIC ASSISTED THORACOSCOPY-RIGHT LOWER LOBE SUPERIOR SEGMENTECTOMY;  Surgeon: Raymond Slot, MD;  Location: [facility] OR;  Service: Thoracic;  Laterality: Right;       Recent HPI and Hospital Course Patient called the clinic and was SOB, with chest pain, recent surgery to remove malignant lung mass, patient was instructed to go to ER at [facility] in  [place] since his surgery was done.  57yo man with tobacco use disorder, emphysema, T2DM, and a recent RLL superior segmentectomy for a RLL pulmonary nodule that came back as lung adenocarcinoma who presented with 3 days of progressive dyspnea and chest pain with breathing, associated with a fever, and admitted for a right sided pleural effusion. IR did thoracentesis removing 1L of orange exudative fluid. We started him on empiric antibiotics since he had been reporting fevers. CT Surgery followed his case. We connected him with the oncology navigator.   After thoracentesis, he felt better, remained stable on room air, but continued to have pleuritic chest pain, which we treated with oral medicines.   I think his thoracentesis was most likely secondary to his recent thoracic surgery, but cannot rule out infection, so we discharged him with a course of Augmentin. He has close follow up with CT surgery.   Post Hospital Acute Care Issue to be followed in the Clinic     Pleural effusion on right Active Problems:   Primary adenocarcinoma of right lung  (HCC)   Type 2 diabetes mellitus with hyperglycemia, with long-term current use of insulin (HCC)   Subjective:   [name] has, No headache, No chest pain, No abdominal pain - No Nausea, No new weakness tingling or numbness, No Cough - SOB. Reports continued chest tightness, some SOB  Assessment & Plan    1. Hospital discharge follow-up Admitted due to pleural effusion and infection s/p recent surgery  2. Pleural effusion on right Tx with thoracentesis in hospital and antibiotics.    Reason for frequent admissions/ER visits    pneumothorax Recurrent pleural effusion Postsurgical infection   Objective:   Vitals:   11/27/22 1308  BP: 128/76  Pulse: 87  Resp: 16  Temp: 98.4 F (36.9 C)  SpO2: 94%  Weight: 229 lb 9.6 oz (104.1 kg)  Height: 5\' 9"  (1.753 m)    Wt Readings from Last 3 Encounters:  11/27/22 229 lb 9.6 oz (104.1 kg)  11/21/22 227 lb 8.2 oz (103.2 kg)  11/06/22 235 lb 3.2 oz (106.7 kg)    Allergies as of 11/27/2022       Reactions   Onion Anaphylaxis   Norco [hydrocodone-acetaminophen] Itching   Nickel Rash   Nicoderm [nicotine] Rash        Medication List        Accurate as of November 27, 2022  1:43 PM. If you have any questions, ask your nurse or doctor.          Accu-Chek Aviva Plus test strip Generic drug: glucose blood Blood sugar testing TID and as needed. E11.65   Accu-Chek FastClix Lancets Misc yse as directed twice a day   acetaminophen 500 MG tablet Commonly known as: TYLENOL Take 2 tablets (1,000 mg total) by mouth 4 (four) times daily.   albuterol 108 (90 Base) MCG/ACT inhaler Commonly known as: VENTOLIN HFA INHALE 2 PUFFS INTO THE LUNGS EVERY 6 HOURS AS MEEDED FOR WHEEZING OR SHORTNESS OF BREATH   amoxicillin-clavulanate 875-125 MG tablet Commonly known as: AUGMENTIN Take 1 tablet by mouth 2 (two) times daily for 14 days.   aspirin EC 81 MG tablet Take 81 mg by mouth daily at 3 pm.   atorvastatin 40 MG  tablet Commonly known as: LIPITOR Take 1 tablet (40 mg total) by mouth daily.   Breztri Aerosphere 160-9-4.8 MCG/ACT Aero Generic drug: Budeson-Glycopyrrol-Formoterol INHALE 2 PUFFS TWICE DAILY   buPROPion 300 MG 24 hr tablet Commonly  known as: Wellbutrin XL Take 1 tablet (300 mg total) by mouth daily. Take along with 150 mg daily   buPROPion 150 MG 24 hr tablet Commonly known as: Wellbutrin XL Take 1 tablet (150 mg total) by mouth daily. Take along with 300 mg daily   dapagliflozin propanediol 10 MG Tabs tablet Commonly known as: Farxiga Take 1 tablet (10 mg total) by mouth daily before breakfast.   DULoxetine 60 MG capsule Commonly known as: CYMBALTA Take 1 capsule (60 mg total) by mouth daily. Take along with 30 mg daily   ezetimibe 10 MG tablet Commonly known as: ZETIA Take 1 tablet (10 mg total) by mouth daily.   famotidine 20 MG tablet Commonly known as: PEPCID Take 1 tablet (20 mg total) by mouth 2 (two) times daily.   FreeStyle Libre 3 Sensor Misc Apply 1 sensor to the skin every 14 days   gabapentin 300 MG capsule Commonly known as: NEURONTIN Take 1 capsule (300 mg total) by mouth 3 (three) times daily.   Gemtesa 75 MG Tabs Generic drug: Vibegron Take 1 tablet (75 mg total) by mouth daily.   Insulin Pen Needle 32G X 4 MM Misc Use with daily dose insulin and with sliding scale insulin as needed.   lidocaine 5 % Commonly known as: LIDODERM Place 1 patch onto the skin daily. Remove & Discard patch within 12 hours or as directed by MD   Melatonin 10 MG Caps Take 30 mg by mouth at bedtime.   metoprolol tartrate 100 MG tablet Commonly known as: LOPRESSOR Take 1 tablet (100 mg total) by mouth 2 (two) times daily.   nitroGLYCERIN 0.4 MG SL tablet Commonly known as: NITROSTAT Place 1 tablet (0.4 mg total) under the tongue every 5 (five) minutes x 3 doses as needed for chest pain.   oxyCODONE 5 MG immediate release tablet Commonly known as: Oxy  IR/ROXICODONE Take 1 tablet (5 mg total) by mouth every 8 (eight) hours as needed for severe pain.   pantoprazole 40 MG tablet Commonly known as: PROTONIX Take 1 tablet (40 mg total) by mouth daily.   Semaglutide 7 MG Tabs Take 1 tablet (7 mg total) by mouth daily before breakfast. On empty stomach with no more than 4 oz of water, wait 30 min before eating or drinking.   tamsulosin 0.4 MG Caps capsule Commonly known as: FLOMAX Take 1 capsule (0.4 mg total) by mouth daily.   temazepam 30 MG capsule Commonly known as: RESTORIL TAKE 1 CAPSULE BY MOUTH AT BEDTIME AS NEEDED FOR SLEEP What changed: See the new instructions.   Toujeo Max SoloStar 300 UNIT/ML Solostar Pen Generic drug: insulin glargine (2 Unit Dial) Inject 70 Units into the skin daily.   Varenicline Tartrate (Starter) 0.5 MG X 11 & 1 MG X 42 Tbpk Take 0.5 mg by mouth once daily on days 1-3, then take 0.5 mg twice daily on days 4-7, then increase to 1 mg twice daily         Physical Exam: Constitutional: Patient appears well-developed and well-nourished. Not in obvious distress. HENT: Normocephalic, atraumatic, External right and left ear normal. Oropharynx is clear and moist.  Eyes: Conjunctivae and EOM are normal. PERRLA, no scleral icterus. Neck: Normal ROM. Neck supple. No JVD. No tracheal deviation. No thyromegaly. CVS: RRR, S1/S2 +, no murmurs, no gallops, no carotid bruit.  Pulmonary: Effort and breath sounds normal, no stridor, rhonchi, wheezes, rales.  Abdominal: Soft. BS +, no distension, tenderness, rebound or guarding.  Musculoskeletal: Normal  range of motion. No edema and no tenderness.  Lymphadenopathy: No lymphadenopathy noted, cervical, inguinal or axillary Neuro: Alert. Normal reflexes, muscle tone coordination. No cranial nerve deficit. Skin: Skin is warm and dry. No rash noted. Not diaphoretic. No erythema. No pallor. Psychiatric: Normal mood and affect. Behavior, judgment, thought content  normal.   Data Review   Micro Results Recent Results (from the past 240 hour(s))  Blood culture (routine x 2)     Status: None   Collection Time: 11/20/22  1:05 PM   Specimen: BLOOD  Result Value Ref Range Status   Specimen Description BLOOD RIGHT ANTECUBITAL  Final   Special Requests   Final    BOTTLES DRAWN AEROBIC AND ANAEROBIC Blood Culture results may not be optimal due to an inadequate volume of blood received in culture bottles   Culture   Final    NO GROWTH 5 DAYS Performed at [facility], [address] [place], [place] [address]    Report Status 11/25/2022 FINAL  Final  Blood culture (routine x 2)     Status: None   Collection Time: 11/20/22  1:10 PM   Specimen: BLOOD RIGHT HAND  Result Value Ref Range Status   Specimen Description BLOOD RIGHT HAND  Final   Special Requests   Final    BOTTLES DRAWN AEROBIC AND ANAEROBIC Blood Culture adequate volume   Culture   Final    NO GROWTH 5 DAYS Performed at [facility], [address], [place], [place] [address]    Report Status 11/25/2022 FINAL  Final  Body fluid culture w Gram Stain     Status: None   Collection Time: 11/20/22  2:11 PM   Specimen: Lung, Right; Pleural Fluid  Result Value Ref Range Status   Specimen Description FLUID RIGHT PLEURAL  Final   Special Requests NONE  Final   Gram Stain   Final    WBC PRESENT,BOTH PMN AND MONONUCLEAR NO ORGANISMS SEEN CYTOSPIN SMEAR    Culture   Final    NO GROWTH 3 DAYS Performed at [facility], [address] [place], [place] [address]    Report Status 11/24/2022 FINAL  Final     CBC Recent Labs  Lab 11/20/22 2326 11/22/22 0235  WBC 10.8* 10.6*  HGB 13.7 12.3*  HCT 40.6 37.0*  PLT 262 240  MCV 86.8 87.5  MCH 29.3 29.1  MCHC 33.7 33.2  RDW 12.7 12.7    Chemistries  Recent Labs  Lab 11/20/22 2326 11/22/22 0235  NA 132* 130*  K 3.9 3.9  CL 94* 94*  CO2 25 23  GLUCOSE 136* 206*  BUN 14 21*  CREATININE 1.16 1.24  CALCIUM  8.9 8.6*  AST 13*  --   ALT 16  --   ALKPHOS 111  --   BILITOT 0.7  --    ------------------------------------------------------------------------------------------------------------------ estimated creatinine clearance is 77.2 mL/min (by C-G formula based on SCr of 1.24 mg/dL). ------------------------------------------------------------------------------------------------------------------ No results for input(s): "HGBA1C" in the last 72 hours. ------------------------------------------------------------------------------------------------------------------ No results for input(s): "CHOL", "HDL", "LDLCALC", "TRIG", "CHOLHDL", "LDLDIRECT" in the last 72 hours. ------------------------------------------------------------------------------------------------------------------ No results for input(s): "TSH", "T4TOTAL", "T3FREE", "THYROIDAB" in the last 72 hours.  Invalid input(s): "FREET3" ------------------------------------------------------------------------------------------------------------------ No results for input(s): "VITAMINB12", "FOLATE", "FERRITIN", "TIBC", "IRON", "RETICCTPCT" in the last 72 hours.  Coagulation profile No results for input(s): "INR", "PROTIME" in the last 168 hours.  No results for input(s): "DDIMER" in the last 72 hours.  Cardiac Enzymes No  results for input(s): "CKMB", "TROPONINI", "MYOGLOBIN" in the last 168 hours.  Invalid input(s): "CK" ------------------------------------------------------------------------------------------------------------------ Invalid input(s): "POCBNP"  Return in about 4 months (around 03/29/2023) for F/U, Recheck A1C, Pamela Maddy PCP.  Time Spent in minutes  45 Time spent with patient included reviewing progress notes, labs, imaging studies, and discussing plan for follow up.   This patient was seen by Raymond Kuster, FNP-C in collaboration with Dr. Beverely Risen as a part of collaborative care agreement.    Raymond Kuster  MSN, FNP-C on 11/27/2022 at 1:43 PM   **Disclaimer: This note may have been dictated with voice recognition software. Similar sounding words can inadvertently be transcribed and this note may contain transcription errors which may not have been corrected upon publication of note.**

## 2022-12-08 ENCOUNTER — Encounter: Payer: Self-pay | Admitting: Nurse Practitioner

## 2022-12-11 ENCOUNTER — Other Ambulatory Visit: Payer: Self-pay | Admitting: Thoracic Surgery (Cardiothoracic Vascular Surgery)

## 2022-12-11 DIAGNOSIS — R911 Solitary pulmonary nodule: Secondary | ICD-10-CM

## 2022-12-12 ENCOUNTER — Encounter: Payer: Self-pay | Admitting: Thoracic Surgery (Cardiothoracic Vascular Surgery)

## 2022-12-12 ENCOUNTER — Ambulatory Visit
Admission: RE | Admit: 2022-12-12 | Discharge: 2022-12-12 | Disposition: A | Discharge status: Home / Self Care | Payer: 59 | Source: Ambulatory Visit | Attending: Thoracic Surgery (Cardiothoracic Vascular Surgery) | Admitting: Thoracic Surgery (Cardiothoracic Vascular Surgery)

## 2022-12-12 ENCOUNTER — Ambulatory Visit (INDEPENDENT_AMBULATORY_CARE_PROVIDER_SITE_OTHER): Payer: Self-pay | Admitting: Thoracic Surgery (Cardiothoracic Vascular Surgery)

## 2022-12-12 VITALS — BP 146/85 | HR 65 | Resp 20 | Ht 69.0 in | Wt 227.0 lb

## 2022-12-12 DIAGNOSIS — Z9889 Other specified postprocedural states: Secondary | ICD-10-CM

## 2022-12-12 DIAGNOSIS — R079 Chest pain, unspecified: Secondary | ICD-10-CM | POA: Diagnosis not present

## 2022-12-12 DIAGNOSIS — J90 Pleural effusion, not elsewhere classified: Secondary | ICD-10-CM | POA: Diagnosis not present

## 2022-12-12 DIAGNOSIS — R911 Solitary pulmonary nodule: Secondary | ICD-10-CM

## 2022-12-12 MED ORDER — OXYCODONE HCL 5 MG PO TABS: 5.0000 mg | ORAL_TABLET | Freq: Three times a day (TID) | ORAL | 0 refills | Status: DC | PRN

## 2022-12-12 NOTE — Progress Notes (Signed)
[address]       Raymond [name] [address]             [phone]     HPI: [name] returns for a scheduled follow-up visit  Raymond [name] is a 57 year old man with a history of tobacco abuse, COPD, reflux, hypertension, hyperlipidemia, CAD, MI, depression, PTSD, sleep apnea, type 2 diabetes, and stage Ia adenocarcinoma of the right lung.  He had a low-dose CT for lung cancer screening in May 2024.  He was found to have a 12.5 mm spiculated nodule in the superior segment of the right lower lobe.  On PET/CT the nodule is hypermetabolic.  He underwent robotic assisted right lower lobe superior segmentectomy and node dissection on 10/27/2022.  His initial postoperative course was uncomplicated boos readmitted in late July with shortness of breath and chest pain.  He had a moderate pleural effusion.  He underwent thoracentesis.  Since then he has been doing better.  He still has some significant pain particular when he tries to sleep at night because he sleeps on his right side.  He is out of oxycodone.  Still on gabapentin which she primarily takes for peripheral neuropathy.  Notices he feels short of breath when he bends over to tie his shoes, but not with other routine activities.  Past Medical History:  Diagnosis Date   Anxiety    Arthritis    NECK   Bronchitis    Coronary artery disease    Depression    Diabetes mellitus without complication (HCC)    Diastasis of rectus abdominis 06/19/2018   Dyspnea    DUE TO GALLBLADDER PER PT   Dysrhythmia    "fast hear rate at times. stopped when placed on metoprolol"   Emphysema lung (HCC)    Family history of adverse reaction to anesthesia    N/V, TROUBLE BREATHING COMING OUT OF ANESTHESIA   Fatty liver    GERD (gastroesophageal reflux disease)    Headache    MIGRAINES   Hyperlipidemia    Hypertension    Irregular heart beat unk   Myocardial infarction (HCC) 2010   MILD    PTSD (post-traumatic stress disorder)     Sleep apnea    USES CPAP   Tachycardia    Ventral hernia     Current Outpatient Medications  Medication Sig Dispense Refill   ACCU-CHEK FASTCLIX LANCETS MISC yse as directed twice a day 100 each 1   acetaminophen (TYLENOL) 500 MG tablet Take 2 tablets (1,000 mg total) by mouth 4 (four) times daily. 30 tablet 0   albuterol (VENTOLIN HFA) 108 (90 Base) MCG/ACT inhaler INHALE 2 PUFFS INTO THE LUNGS EVERY 6 HOURS AS MEEDED FOR WHEEZING OR SHORTNESS OF BREATH 18 g 1   aspirin EC 81 MG tablet Take 81 mg by mouth daily at 3 pm.      atorvastatin (LIPITOR) 40 MG tablet Take 1 tablet (40 mg total) by mouth daily. 90 tablet 0   BREZTRI AEROSPHERE 160-9-4.8 MCG/ACT AERO INHALE 2 PUFFS TWICE DAILY 11 g 0   buPROPion (WELLBUTRIN XL) 150 MG 24 hr tablet Take 1 tablet (150 mg total) by mouth daily. Take along with 300 mg daily 90 tablet 1   buPROPion (WELLBUTRIN XL) 300 MG 24 hr tablet Take 1 tablet (300 mg total) by mouth daily. Take along with 150 mg daily 90 tablet 1   Continuous Blood Gluc Sensor (FREESTYLE LIBRE 3 SENSOR) MISC Apply 1 sensor to  the skin every 14 days 6 each 3   dapagliflozin propanediol (FARXIGA) 10 MG TABS tablet Take 1 tablet (10 mg total) by mouth daily before breakfast. 90 tablet 2   DULoxetine (CYMBALTA) 60 MG capsule Take 1 capsule (60 mg total) by mouth daily. Take along with 30 mg daily 90 capsule 1   ezetimibe (ZETIA) 10 MG tablet Take 1 tablet (10 mg total) by mouth daily. 90 tablet 3   famotidine (PEPCID) 20 MG tablet Take 1 tablet (20 mg total) by mouth 2 (two) times daily. 180 tablet 1   gabapentin (NEURONTIN) 300 MG capsule Take 1 capsule (300 mg total) by mouth 3 (three) times daily. 90 capsule 0   glucose blood (ACCU-CHEK AVIVA PLUS) test strip Blood sugar testing TID and as needed. E11.65 100 each 12   insulin glargine, 2 Unit Dial, (TOUJEO MAX SOLOSTAR) 300 UNIT/ML Solostar Pen Inject 70 Units into the skin daily. 18 mL 3   Insulin Pen Needle 32G X 4 MM MISC Use  with daily dose insulin and with sliding scale insulin as needed. 100 each 5   lidocaine (LIDODERM) 5 % Place 1 patch onto the skin daily. Remove & Discard patch within 12 hours or as directed by MD 30 patch 0   Melatonin 10 MG CAPS Take 30 mg by mouth at bedtime.     metoprolol tartrate (LOPRESSOR) 100 MG tablet Take 1 tablet (100 mg total) by mouth 2 (two) times daily. 180 tablet 1   nitroGLYCERIN (NITROSTAT) 0.4 MG SL tablet Place 1 tablet (0.4 mg total) under the tongue every 5 (five) minutes x 3 doses as needed for chest pain. 30 tablet 1   pantoprazole (PROTONIX) 40 MG tablet Take 1 tablet (40 mg total) by mouth daily. 90 tablet 1   Semaglutide 7 MG TABS Take 1 tablet (7 mg total) by mouth daily before breakfast. On empty stomach with no more than 4 oz of water, wait 30 min before eating or drinking. 30 tablet 3   tamsulosin (FLOMAX) 0.4 MG CAPS capsule Take 1 capsule (0.4 mg total) by mouth daily. 30 capsule 3   temazepam (RESTORIL) 30 MG capsule TAKE 1 CAPSULE BY MOUTH AT BEDTIME AS NEEDED FOR SLEEP (Patient taking differently: Take 30 mg by mouth at bedtime.) 30 capsule 1   Varenicline Tartrate, Starter, 0.5 MG X 11 & 1 MG X 42 TBPK Take 0.5 mg by mouth once daily on days 1-3, then take 0.5 mg twice daily on days 4-7, then increase to 1 mg twice daily 53 each 0   Vibegron (GEMTESA) 75 MG TABS Take 1 tablet (75 mg total) by mouth daily. 30 tablet 0   oxyCODONE (OXY IR/ROXICODONE) 5 MG immediate release tablet Take 1 tablet (5 mg total) by mouth every 8 (eight) hours as needed for severe pain. 15 tablet 0   No current facility-administered medications for this visit.    Physical Exam BP (!) 146/85 (BP Location: Left Arm, Patient Position: Sitting)   Pulse 65   Resp 20   Ht 5\' 9"  (1.753 m)   Wt 227 lb (103 kg)   SpO2 96% Comment: RA  BMI 33.26 kg/m  57 year old man in no acute distress Alert and oriented x 3 with no focal deficits Lungs clear with essentially equal breath sounds  bilaterally, no wheezing Cardiac regular rate and rhythm No peripheral edema Incisions clean dry and intact   Diagnostic Tests: I personally reviewed his chest x-ray images.  There is a small  residual pleural effusion on the right but overall improved aeration compared to his prethoracentesis film.  Impression: Raymond [name] is a 57 year old man with a history of tobacco abuse, COPD, reflux, hypertension, hyperlipidemia, CAD, MI, depression, PTSD, sleep apnea, type 2 diabetes, and stage Ia adenocarcinoma of the right lung.  He was found to have a right lower lobe lung nodule on a low-dose screening CT.  Stage Ia adenocarcinoma right lower lobe-status post superior segmentectomy.  Margins clear nodes negative.  Does not need any adjuvant therapy but will need long-term follow-up.  Will refer to oncology at [facility].  Status post right lower lobe superior segmentectomy-still has some incisional pain.  He is on gabapentin.  Has been taking ibuprofen which helps but he still having difficulty sleeping at night.  I gave him a new prescription for oxycodone 5 mg tablets 1 p.o. every 8 hours as needed.  Hopefully he will be able to come off narcotics after that prescription is completed.  There were no restrictions on his activities, but he was advised to avoid activities which cause undue discomfort.  Plan: Refer to oncology at [facility] in 2 months with PA and lateral chest x-ray to check on progress He knows to call if he has worsening issues in the meantime  Loreli Slot, MD [facility] [phone]

## 2022-12-14 ENCOUNTER — Encounter: Payer: Self-pay | Admitting: Internal Medicine

## 2022-12-14 ENCOUNTER — Inpatient Hospital Stay: Payer: 59 | Attending: Internal Medicine | Admitting: Internal Medicine

## 2022-12-14 ENCOUNTER — Encounter: Payer: Self-pay | Admitting: *Deleted

## 2022-12-14 ENCOUNTER — Inpatient Hospital Stay: Payer: 59

## 2022-12-14 VITALS — BP 139/89 | HR 136 | Temp 98.9°F | Ht 69.0 in | Wt 224.0 lb

## 2022-12-14 DIAGNOSIS — C3431 Malignant neoplasm of lower lobe, right bronchus or lung: Secondary | ICD-10-CM

## 2022-12-14 DIAGNOSIS — Z79899 Other long term (current) drug therapy: Secondary | ICD-10-CM | POA: Diagnosis not present

## 2022-12-14 DIAGNOSIS — I2510 Atherosclerotic heart disease of native coronary artery without angina pectoris: Secondary | ICD-10-CM | POA: Insufficient documentation

## 2022-12-14 DIAGNOSIS — Z9049 Acquired absence of other specified parts of digestive tract: Secondary | ICD-10-CM | POA: Diagnosis not present

## 2022-12-14 DIAGNOSIS — I252 Old myocardial infarction: Secondary | ICD-10-CM | POA: Diagnosis not present

## 2022-12-14 DIAGNOSIS — J439 Emphysema, unspecified: Secondary | ICD-10-CM | POA: Diagnosis not present

## 2022-12-14 DIAGNOSIS — K219 Gastro-esophageal reflux disease without esophagitis: Secondary | ICD-10-CM | POA: Insufficient documentation

## 2022-12-14 DIAGNOSIS — F17210 Nicotine dependence, cigarettes, uncomplicated: Secondary | ICD-10-CM | POA: Insufficient documentation

## 2022-12-14 DIAGNOSIS — E785 Hyperlipidemia, unspecified: Secondary | ICD-10-CM | POA: Insufficient documentation

## 2022-12-14 DIAGNOSIS — G4730 Sleep apnea, unspecified: Secondary | ICD-10-CM | POA: Diagnosis not present

## 2022-12-14 DIAGNOSIS — Z7982 Long term (current) use of aspirin: Secondary | ICD-10-CM | POA: Diagnosis not present

## 2022-12-14 DIAGNOSIS — E119 Type 2 diabetes mellitus without complications: Secondary | ICD-10-CM | POA: Diagnosis not present

## 2022-12-14 DIAGNOSIS — C3491 Malignant neoplasm of unspecified part of right bronchus or lung: Secondary | ICD-10-CM

## 2022-12-14 DIAGNOSIS — Z7984 Long term (current) use of oral hypoglycemic drugs: Secondary | ICD-10-CM | POA: Insufficient documentation

## 2022-12-14 DIAGNOSIS — Z794 Long term (current) use of insulin: Secondary | ICD-10-CM | POA: Insufficient documentation

## 2022-12-14 DIAGNOSIS — I10 Essential (primary) hypertension: Secondary | ICD-10-CM | POA: Insufficient documentation

## 2022-12-14 DIAGNOSIS — K760 Fatty (change of) liver, not elsewhere classified: Secondary | ICD-10-CM | POA: Insufficient documentation

## 2022-12-14 NOTE — Progress Notes (Signed)
C/o pain in chest from surgery(10/27/22), 8/10. Has oxy for pain.  Had some xrays earlier this week at [facility].  Having some nausea. Pt states he thinks it's from the pain.

## 2022-12-14 NOTE — Progress Notes (Signed)
Pt seen by Dr. Alena Bills today following lung resection from 7/5. Per Dr. Mervyn Skeeters, pt only needs surveillance at this time following surgery. Adjuvant therapy not needed. Navigation not needed due to pt completing treatment with surgery. Nothing further needed.

## 2022-12-15 ENCOUNTER — Inpatient Hospital Stay: Payer: 59

## 2022-12-15 NOTE — Progress Notes (Signed)
[facility] CONSULT NOTE  Patient Care Team: Sallyanne Kuster, NP as PCP - General (Nurse Practitioner) Lyndon Code, MD (Internal Medicine) Monika Salk, [facility] (Inactive) as Pharmacist (Pharmacist) Michaelyn Barter, MD as Consulting Physician (Oncology) Glory Buff, RN as Oncology Nurse Navigator  REFERRING PROVIDER: Dr. Dorris Fetch  REASON FOR REFFERAL: Stage Ia lung cancer, surveillance  CANCER STAGING   Cancer Staging  Primary adenocarcinoma of right lung [facility]) Staging form: Lung, AJCC 8th Edition - Clinical stage from 10/27/2022: Stage IA2 (cT1b, cN0, cM0) - Signed by Michaelyn Barter, MD on 12/15/2022   ASSESSMENT & PLAN:  Raymond [name] 57 y.o. male with pmh of smoking,Diabetes, depression, anxiety, OSA, hypertension, hyperlipidemia referred to medical oncology for surveillance of stage Ia right lung adenocarcinoma detected on low-dose CT lung screening.  # Right lung adenocarcinoma, stage IA -Detected on low-dose CT lung screening.  PET scan from 09/20/2022 showed 1.5 cm nodule in the superior segment of right lower lobe.  - s/p Robotic assisted VATS with right lower lobe superior segmentectomy, node dissection and intercostal nerve block with Dr. Dorris Fetch on 10/27/2022.  Pathology report showed 1.2 x 1 x 1 cm invasive nonmucinous adenocarcinoma with lipidic component, margins negative, no LVI, 0/15 lymph nodes negative.  pT1b, N0  -No role for adjuvant chemotherapy.  Will do surveillance based on NCCN guidelines.   -Scheduled for CT chest with contrast in beginning of January 2024. -Patient continues to smoke.  Reports he was doing well on nicotine patches and then was admitted to the hospital for the pleural effusion and went back on smoking.  Patient was encouraged to reach out to his primary and resume his nicotine patches for smoking cessation.  Orders Placed This Encounter  Procedures   CT Chest W Contrast    Standing Status:   Future     Standing Expiration Date:   12/14/2023    Order Specific Question:   If indicated for the ordered procedure, I authorize the administration of contrast media per Radiology protocol    Answer:   Yes    Order Specific Question:   Does the patient have a contrast media/X-ray dye allergy?    Answer:   No    Order Specific Question:   Preferred imaging location?    Answer:   [facility]   RTC in 6 months for MD visit to discuss CT scan.  The total time spent in the appointment was 55 minutes encounter with patients including review of chart and various tests results, discussions about plan of care and coordination of care plan   All questions were answered. The patient knows to call the clinic with any problems, questions or concerns. No barriers to learning was detected.  Michaelyn Barter, MD 8/23/20243:42 PM   HISTORY OF PRESENTING ILLNESS:  Raymond [name] 57 y.o. male with pmh of smoking,Diabetes, depression, anxiety, OSA, hypertension, hyperlipidemia referred to medical oncology for surveillance of stage Ia right lung adenocarcinoma detected on low-dose CT lung screening.  Patient reports some pain in the right side which has been gradually improving.  Postsurgery, he did have right sided pleural effusion which was drained and has improved now.  He continues to smoke.  Reports he was doing well on higher dose of nicotine patch but then was admitted to the hospital which kind of broke his cycle and now is back to smoking.  He was encouraged to get back to his PCP and resume his patches again.  I have reviewed his chart and  materials related to his cancer extensively and collaborated history with the patient. Summary of oncologic history is as follows: Oncology History  Primary adenocarcinoma of right lung (HCC)  08/31/2022 Imaging   With history of smoking, patient underwent low-dose CT lung screening.  MPRESSION: 1. Lung-RADS 4B, suspicious. Additional imaging evaluation  or consultation with Pulmonology or Thoracic Surgery recommended. New indistinct irregular solid superior segment right lower lobe pulmonary nodule measuring 12.5 mm in volume derived mean diameter, suspicious for primary bronchogenic malignancy. PET-CT suggested at this time for further characterization. 2. Two-vessel coronary atherosclerosis. 3. Aortic Atherosclerosis   09/20/2022 PET scan   MPRESSION: 15 mm bilobed nodule in the superior segment right lower lobe demonstrates mild hypermetabolism. While infectious/inflammatory etiologies are technically possible, this appearance is considered suspicious for primary bronchogenic carcinoma such as semi-invasive adenocarcinoma.   No evidence of metastatic disease.   10/27/2022 Cancer Staging   Staging form: Lung, AJCC 8th Edition - Clinical stage from 10/27/2022: Stage IA2 (cT1b, cN0, cM0) - Signed by Michaelyn Barter, MD on 12/15/2022   10/27/2022 Definitive Surgery   FINAL MICROSCOPIC DIAGNOSIS:  A. LYMPH NODE, LEVEL 11, EXCISION: One lymph node with rare atypical cells (see comment)  B. LYMPH NODE, LEVEL 12, EXCISION: One benign lymph node, negative for carcinoma (0/1)  C. RIGHT LUNG, LOWER LOBE, SUPERIOR SEGMENT, NODULE, SEGMENTECTOMY: Invasive well to moderately differentiated adenocarcinoma Tumor measures 1.2 x 1.0 x 1.0 cm (pT1b) Margins free  D. LYMPH NODE, LEVEL 9, EXCISION: One benign lymph node, negative for carcinoma (0/1)  E. LYMPH NODE, LEVEL 8, EXCISION: One benign lymph node, negative for carcinoma (0/1)  F. LYMPH NODE, LEVEL 8 #2, EXCISION: One benign lymph node, negative for carcinoma (0/1)  G. LYMPH NODE, LEVEL 7, EXCISION: One benign lymph node, negative for carcinoma (0/1)  H. LYMPH NODE, LEVEL 7 #2, EXCISION: One benign lymph node, negative for carcinoma (0/1)  I. LYMPH NODE, LEVEL 7 #3, EXCISION: One benign lymph node, negative for carcinoma (0/1)  J. LYMPH NODE, LEVEL 11 #2, EXCISION: One benign  lymph node, negative for carcinoma (0/1)  K. LYMPH NODE, LEVEL 12 #2, EXCISION: One benign lymph node, negative for carcinoma (0/1)  L. LYMPH NODE, LEVEL 12 #3, EXCISION: One benign lymph node, negative for carcinoma (0/1)  M. LYMPH NODE, LEVEL 12 #4, EXCISION: One benign lymph node, negative for carcinoma (0/1)  N. LYMPH NODE, LEVEL 13, EXCISION: Benign alveolar laded lung with scant lymphoid stroma Negative for carcinoma  O. LYMPH NODE, LEVEL 10, EXCISION: One benign lymph node, negative for carcinoma (0/1)  P. LYMPH NODE, LEVEL 12 #5, EXCISION: One benign lymph node, negative for carcinoma (0/1)  Q. LYMPH NODE, LEVEL 13 #2, EXCISION: One benign lymph node, negative for carcinoma (0/1)  ONCOLOGY TABLE:  LUNG: Resection  Synchronous Tumors: Raymond applicable Total Number of Primary Tumors: 1 Procedure: Segmentectomy Specimen Laterality: Right Tumor Focality: Unifocal Tumor Site: Lower lobe, superior segment Tumor Size:      Total Tumor Size: 1.2 x 1.0 x 1.0 cm      Invasive Tumor Size (applies only to invasive nonmucinous adenocarcinoma with a lepidic           component): 1.1 x 0.9 x 0.9 cm Histologic Type: Adenocarcinoma Visceral Pleura Invasion: Raymond identified Direct Invasion of Adjacent Structures: No adjacent structures present Lymphovascular Invasion: Raymond identified Margins: All margins negative for invasive carcinoma      Closest Margin(s) to Invasive Carcinoma: 3 cm from bronchial margin      Margin(s)  Involved by Invasive Carcinoma: Raymond applicable       Margin Status for Non-Invasive Tumor: Raymond applicable Treatment Effect: No known presurgical therapy Regional Lymph Nodes:      Number of Lymph Nodes Involved: Pending                           Nodal Sites with Tumor: Pending      Number of Lymph Nodes Examined: 15                      Nodal Sites Examined: Station 7, 8, 9, 10, 11, 12 and 13 Distant Metastasis: Raymond applicable Pathologic Stage  Classification (pTNM, AJCC 8th Edition): pT1b, pN0 Ancillary Studies: Available upon request Representative Tumor Block: C2, C4, C5 Comment(s): On frozen section the initial station 11 lymph node shows rare atypical cells.  An immunohistochemical stain for pancytokeratin (AE1/AE3) performed with adequate control on the permanent sections of this station 11 lymph node (A) is negative for epithelial cells.      11/21/2022 Initial Diagnosis   Primary adenocarcinoma of right lung [facility])     MEDICAL HISTORY:  Past Medical History:  Diagnosis Date   Anxiety    Arthritis    NECK   Bronchitis    Coronary artery disease    Depression    Diabetes mellitus without complication (HCC)    Diastasis of rectus abdominis 06/19/2018   Dyspnea    DUE TO GALLBLADDER PER PT   Dysrhythmia    "fast hear rate at times. stopped when placed on metoprolol"   Emphysema lung (HCC)    Family history of adverse reaction to anesthesia    N/V, TROUBLE BREATHING COMING OUT OF ANESTHESIA   Fatty liver    GERD (gastroesophageal reflux disease)    Headache    MIGRAINES   Hyperlipidemia    Hypertension    Irregular heart beat unk   Myocardial infarction (HCC) 2010   MILD    PTSD (post-traumatic stress disorder)    Sleep apnea    USES CPAP   Tachycardia    Ventral hernia     SURGICAL HISTORY: Past Surgical History:  Procedure Laterality Date    ingrown toenail removal     CHOLECYSTECTOMY N/A 01/23/2019   Procedure: LAPAROSCOPIC CHOLECYSTECTOMY;  Surgeon: Henrene Dodge, MD;  Location: [facility] ORS;  Service: General;  Laterality: N/A;   CLAVICLE SURGERY Right    COLONOSCOPY  2017   COLONOSCOPY WITH PROPOFOL N/A 08/25/2020   Procedure: COLONOSCOPY WITH PROPOFOL;  Surgeon: Pasty Spillers, MD;  Location: [facility] ENDOSCOPY;  Service: Endoscopy;  Laterality: N/A;   ESOPHAGOGASTRODUODENOSCOPY (EGD) WITH PROPOFOL N/A 08/25/2020   Procedure: ESOPHAGOGASTRODUODENOSCOPY (EGD) WITH PROPOFOL;  Surgeon: Pasty Spillers, MD;  Location: [facility] ENDOSCOPY;  Service: Endoscopy;  Laterality: N/A;   IR THORACENTESIS ASP PLEURAL SPACE W/IMG GUIDE  11/20/2022   LYMPH NODE DISSECTION Right 10/27/2022   Procedure: LYMPH NODE DISSECTION;  Surgeon: Loreli Slot, MD;  Location: [facility] OR;  Service: Thoracic;  Laterality: Right;   MOUTH SURGERY     REMOVED ALL TEETH   SHOULDER SURGERY Right    UPPER GI ENDOSCOPY  2017   XI ROBOTIC ASSISTED THORACOSCOPY- SEGMENTECTOMY Right 10/27/2022   Procedure: XI ROBOTIC ASSISTED THORACOSCOPY-RIGHT LOWER LOBE SUPERIOR SEGMENTECTOMY;  Surgeon: Loreli Slot, MD;  Location: [facility] OR;  Service: Thoracic;  Laterality: Right;    SOCIAL HISTORY: Social History   Socioeconomic History   Marital  status: Divorced    Spouse name: Raymond on file   Number of children: 4   Years of education: Raymond on file   Highest education level: 8th grade  Occupational History   Raymond on file  Tobacco Use   Smoking status: Every Day    Current packs/day: 0.25    Average packs/day: 0.3 packs/day for 40.0 years (10.0 ttl pk-yrs)    Types: Cigarettes   Smokeless tobacco: Former    Types: Chew   Tobacco comments:    2-3 cigarettes per day as of October 25, 2022  Vaping Use   Vaping status: Former  Substance and Sexual Activity   Alcohol use: No    Alcohol/week: 0.0 standard drinks of alcohol   Drug use: No   Sexual activity: Raymond Currently  Other Topics Concern   Raymond on file  Social History Narrative   Raymond on file   Social Determinants of Health   Financial Resource Strain: Low Risk  (08/17/2020)   Overall Financial Resource Strain (CARDIA)    Difficulty of Paying Living Expenses: Raymond very hard  Food Insecurity: Food Insecurity Present (12/14/2022)   Hunger Vital Sign    Worried About Running Out of Food in the Last Year: Often true    Ran Out of Food in the Last Year: Often true  Transportation Needs: No Transportation Needs (12/14/2022)   PRAPARE - Scientist, research (physical sciences) (Medical): No    Lack of Transportation (Non-Medical): No  Physical Activity: Inactive (05/23/2018)   Exercise Vital Sign    Days of Exercise per Week: 0 days    Minutes of Exercise per Session: 0 min  Stress: Stress Concern Present (05/23/2018)   [employer] of Occupational Health - Occupational Stress Questionnaire    Feeling of Stress : Very much  Social Connections: Unknown (05/23/2018)   Social Connection and Isolation Panel [NHANES]    Frequency of Communication with Friends and Family: Raymond on file    Frequency of Social Gatherings with Friends and Family: Raymond on file    Attends Religious Services: Never    Active Member of Clubs or Organizations: No    Attends Banker Meetings: Never    Marital Status: Divorced  Catering manager Violence: Raymond At Risk (12/14/2022)   Humiliation, Afraid, Rape, and Kick questionnaire    Fear of Current or Ex-Partner: No    Emotionally Abused: No    Physically Abused: No    Sexually Abused: No    FAMILY HISTORY: Family History  Problem Relation Age of Onset   Hypertension Mother    Diabetes type II Mother    Diabetes Mother    COPD Father    Lung cancer Father    Heart disease Father    Diabetes Sister    Anxiety disorder Sister    Depression Sister    Diabetes Sister    Anxiety disorder Sister    Depression Sister    Diabetes Sister    Anxiety disorder Sister    Depression Sister    Diabetes Sister    Anxiety disorder Sister    Depression Sister    Diabetes Brother    Anxiety disorder Brother    Depression Brother    Diabetes Maternal Aunt    Colon cancer Maternal Uncle     ALLERGIES:  is allergic to onion, norco [hydrocodone-acetaminophen], nickel, and nicoderm [nicotine].  MEDICATIONS:  Current Outpatient Medications  Medication Sig Dispense Refill   ACCU-CHEK FASTCLIX LANCETS MISC yse  as directed twice a day 100 each 1   acetaminophen (TYLENOL) 500 MG tablet Take 2 tablets (1,000 mg  total) by mouth 4 (four) times daily. 30 tablet 0   albuterol (VENTOLIN HFA) 108 (90 Base) MCG/ACT inhaler INHALE 2 PUFFS INTO THE LUNGS EVERY 6 HOURS AS MEEDED FOR WHEEZING OR SHORTNESS OF BREATH 18 g 1   aspirin EC 81 MG tablet Take 81 mg by mouth daily at 3 pm.      atorvastatin (LIPITOR) 40 MG tablet Take 1 tablet (40 mg total) by mouth daily. 90 tablet 0   BREZTRI AEROSPHERE 160-9-4.8 MCG/ACT AERO INHALE 2 PUFFS TWICE DAILY 11 g 0   buPROPion (WELLBUTRIN XL) 150 MG 24 hr tablet Take 1 tablet (150 mg total) by mouth daily. Take along with 300 mg daily 90 tablet 1   buPROPion (WELLBUTRIN XL) 300 MG 24 hr tablet Take 1 tablet (300 mg total) by mouth daily. Take along with 150 mg daily 90 tablet 1   Continuous Blood Gluc Sensor (FREESTYLE LIBRE 3 SENSOR) MISC Apply 1 sensor to the skin every 14 days 6 each 3   dapagliflozin propanediol (FARXIGA) 10 MG TABS tablet Take 1 tablet (10 mg total) by mouth daily before breakfast. 90 tablet 2   DULoxetine (CYMBALTA) 60 MG capsule Take 1 capsule (60 mg total) by mouth daily. Take along with 30 mg daily 90 capsule 1   ezetimibe (ZETIA) 10 MG tablet Take 1 tablet (10 mg total) by mouth daily. 90 tablet 3   famotidine (PEPCID) 20 MG tablet Take 1 tablet (20 mg total) by mouth 2 (two) times daily. 180 tablet 1   gabapentin (NEURONTIN) 300 MG capsule Take 1 capsule (300 mg total) by mouth 3 (three) times daily. 90 capsule 0   glucose blood (ACCU-CHEK AVIVA PLUS) test strip Blood sugar testing TID and as needed. E11.65 100 each 12   insulin glargine, 2 Unit Dial, (TOUJEO MAX SOLOSTAR) 300 UNIT/ML Solostar Pen Inject 70 Units into the skin daily. 18 mL 3   Insulin Pen Needle 32G X 4 MM MISC Use with daily dose insulin and with sliding scale insulin as needed. 100 each 5   lidocaine (LIDODERM) 5 % Place 1 patch onto the skin daily. Remove & Discard patch within 12 hours or as directed by MD 30 patch 0   Melatonin 10 MG CAPS Take 30 mg by mouth at bedtime.      metoprolol tartrate (LOPRESSOR) 100 MG tablet Take 1 tablet (100 mg total) by mouth 2 (two) times daily. 180 tablet 1   nitroGLYCERIN (NITROSTAT) 0.4 MG SL tablet Place 1 tablet (0.4 mg total) under the tongue every 5 (five) minutes x 3 doses as needed for chest pain. 30 tablet 1   oxyCODONE (OXY IR/ROXICODONE) 5 MG immediate release tablet Take 1 tablet (5 mg total) by mouth every 8 (eight) hours as needed for severe pain. 15 tablet 0   pantoprazole (PROTONIX) 40 MG tablet Take 1 tablet (40 mg total) by mouth daily. 90 tablet 1   Semaglutide 7 MG TABS Take 1 tablet (7 mg total) by mouth daily before breakfast. On empty stomach with no more than 4 oz of water, wait 30 min before eating or drinking. 30 tablet 3   tamsulosin (FLOMAX) 0.4 MG CAPS capsule Take 1 capsule (0.4 mg total) by mouth daily. 30 capsule 3   temazepam (RESTORIL) 30 MG capsule TAKE 1 CAPSULE BY MOUTH AT BEDTIME AS NEEDED FOR SLEEP (Patient taking differently:  Take 30 mg by mouth at bedtime.) 30 capsule 1   Varenicline Tartrate, Starter, 0.5 MG X 11 & 1 MG X 42 TBPK Take 0.5 mg by mouth once daily on days 1-3, then take 0.5 mg twice daily on days 4-7, then increase to 1 mg twice daily (Patient Raymond taking: Reported on 12/14/2022) 53 each 0   No current [name]-administered medications for this visit.    REVIEW OF SYSTEMS:   Pertinent information mentioned in HPI All other systems were reviewed with the patient and are negative.  PHYSICAL EXAMINATION: ECOG PERFORMANCE STATUS: 1 - Symptomatic but completely ambulatory   Vitals:   12/14/22 1353  BP: 139/89  Pulse: (!) 136  Temp: 98.9 F (37.2 C)  SpO2: 98%   Filed Weights   12/14/22 1353  Weight: 224 lb (101.6 kg)    GENERAL:alert, no distress and comfortable SKIN: skin color, texture, turgor are normal, no rashes or significant lesions EYES: normal, conjunctiva are pink and non-injected, sclera clear OROPHARYNX:no exudate, no erythema and lips, buccal mucosa, and  tongue normal  NECK: supple, thyroid normal size, non-tender, without nodularity LYMPH:  no palpable lymphadenopathy in the cervical, axillary or inguinal LUNGS: clear to auscultation and percussion with normal breathing effort HEART: regular rate & rhythm and no murmurs and no lower extremity edema ABDOMEN:abdomen soft, non-tender and normal bowel sounds Musculoskeletal:no cyanosis of digits and no clubbing  PSYCH: alert & oriented x 3 with fluent speech NEURO: no focal motor/sensory deficits  LABORATORY DATA:  I have reviewed the data as listed Lab Results  Component Value Date   WBC 10.6 (H) 11/22/2022   HGB 12.3 (L) 11/22/2022   HCT 37.0 (L) 11/22/2022   MCV 87.5 11/22/2022   PLT 240 11/22/2022   Recent Labs    08/23/22 1828 10/25/22 1120 10/28/22 0335 10/29/22 0708 11/20/22 1016 11/20/22 2326 11/22/22 0235  NA 135 134*   < > 133* 130* 132* 130*  K 4.4 4.5   < > 4.4 4.1 3.9 3.9  CL 104 97*   < > 104 95* 94* 94*  CO2 23 21*   < > 21* 22 25 23   GLUCOSE 245* 291*   < > 164* 222* 136* 206*  BUN 15 16   < > 21* 11 14 21*  CREATININE 1.18 0.91   < > 1.02 1.17 1.16 1.24  CALCIUM 9.0 9.5   < > 8.3* 8.9 8.9 8.6*  GFRNONAA >60 >60   < > >60 >60 >60 >60  PROT 7.2 7.6  --  6.3*  --  7.2  --   ALBUMIN 3.8 3.7  --  3.0*  --  2.9*  --   AST 21 17  --  18  --  13*  --   ALT 17 20  --  15  --  16  --   ALKPHOS 110 109  --  75  --  111  --   BILITOT 0.4 0.5  --  0.7  --  0.7  --   BILIDIR <0.1  --   --   --   --   --   --   IBILI Raymond CALCULATED  --   --   --   --   --   --    < > = values in this interval Raymond displayed.    RADIOGRAPHIC STUDIES: I have personally reviewed the radiological images as listed and agreed with the findings in the report. DG Chest 2 View  Result Date: 12/12/2022 CLINICAL DATA:  Postsurgical chest pain. EXAM: CHEST - 2 VIEW COMPARISON:  November 22, 2022. FINDINGS: The heart size and mediastinal contours are within normal limits. Left lung is clear. Small  right pleural effusion is noted with possible minimal associated subsegmental atelectasis. Status post surgical internal fixation of old right clavicular fracture. IMPRESSION: Small right pleural effusion is noted with possible minimal associated subsegmental atelectasis. Electronically Signed   By: Lupita Raider M.D.   On: 12/12/2022 09:30   DG CHEST PORT 1 VIEW  Result Date: 11/22/2022 CLINICAL DATA:  Follow-up pleural effusion EXAM: PORTABLE CHEST 1 VIEW COMPARISON:  11/20/2022 FINDINGS: Left chest remains clear. Slight decrease in pleural fluid on the right with slight improvement in aeration at the right lung base. Small effusion and mild atelectasis at the right lung base persist. No worsening or new finding. IMPRESSION: Slight decrease in pleural fluid on the right with slight improvement in aeration at the right lung base. Electronically Signed   By: Paulina Fusi M.D.   On: 11/22/2022 11:29   IR THORACENTESIS ASP PLEURAL SPACE W/IMG GUIDE  Result Date: 11/20/2022 INDICATION: Patient with history of recent right lower lobe segmentectomy presents with shortness of breath, CXR showed right pleural effusion. Request for therapeutic and diagnostic thoracentesis. EXAM: ULTRASOUND GUIDED RIGHT THORACENTESIS MEDICATIONS: 10 mL 1% lidocaine COMPLICATIONS: None immediate. PROCEDURE: An ultrasound guided thoracentesis was thoroughly discussed with the patient and questions answered. The benefits, risks, alternatives and complications were also discussed. The patient understands and wishes to proceed with the procedure. Written consent was obtained. Ultrasound was performed to localize and mark an adequate pocket of fluid in the right chest. The area was then prepped and draped in the normal sterile fashion. 1% Lidocaine was used for local anesthesia. Under ultrasound guidance a 6 Fr Safe-T-Centesis catheter was introduced. Thoracentesis was performed. The catheter was removed and a dressing applied. FINDINGS:  A total of approximately 1 L of hazy amber fluid was removed. Samples were sent to the laboratory as requested by the clinical team. Post procedure chest X-ray reviewed, negative for pneumothorax. IMPRESSION: Successful ultrasound guided right thoracentesis yielding 1 L of pleural fluid. Performed by: Lawernce Ion, PA-C Electronically Signed   By: Corlis Leak M.D.   On: 11/20/2022 15:31   DG Chest 1 View  Result Date: 11/20/2022 CLINICAL DATA:  [id number] S/P thoracentesis [id number] EXAM: CHEST  1 VIEW COMPARISON:  Earlier film of the same day FINDINGS: No pneumothorax. Decrease in right pleural effusion with small residual. Improved aeration of right lung base. Left lung clear. Heart size and mediastinal contours are within normal limits. Aortic Atherosclerosis (ICD10-170.0). Right clavicle fixation hardware. IMPRESSION: Decreased right pleural effusion with improved aeration of the right lung base. No pneumothorax. Electronically Signed   By: Corlis Leak M.D.   On: 11/20/2022 15:02   DG Chest 2 View  Result Date: 11/20/2022 CLINICAL DATA:  cp/ post lung surgery EXAM: CHEST - 2 VIEW COMPARISON:  CXR 10/30/22 FINDINGS: New right-sided pleural effusion with a superimposed hazy airspace opacities that could represent atelectasis or infection. Unchanged prominent cardiac contours. No radiographically apparent displaced rib fractures. Visualized upper abdomen is unremarkable. No pneumothorax. Right clavicular screw and plate fixation hardware. IMPRESSION: New right-sided pleural effusion with superimposed hazy airspace opacities that could represent atelectasis or infection. Electronically Signed   By: Lorenza Cambridge M.D.   On: 11/20/2022 11:21

## 2022-12-15 NOTE — Progress Notes (Signed)
[facility] Clinical Social Work  Clinical Social Work was referred by nurse for assessment of psychosocial needs.  Clinical Social Worker contacted patient by phone to offer support and assess for needs.    Patient expressed having food insecurities.  Securely emailed patient the [employer] list per his request.  Per patient, he will not be receiving treatment due to a successful surgery.     Dorothey Baseman, LCSW  Clinical Social Worker [facility]

## 2022-12-16 ENCOUNTER — Other Ambulatory Visit: Payer: Self-pay | Admitting: Psychiatry

## 2022-12-16 DIAGNOSIS — F4310 Post-traumatic stress disorder, unspecified: Secondary | ICD-10-CM

## 2022-12-16 DIAGNOSIS — F3342 Major depressive disorder, recurrent, in full remission: Secondary | ICD-10-CM

## 2022-12-18 ENCOUNTER — Other Ambulatory Visit: Payer: Self-pay | Admitting: Nurse Practitioner

## 2022-12-18 DIAGNOSIS — K219 Gastro-esophageal reflux disease without esophagitis: Secondary | ICD-10-CM

## 2022-12-21 ENCOUNTER — Other Ambulatory Visit: Payer: Self-pay | Admitting: Nurse Practitioner

## 2022-12-21 DIAGNOSIS — K219 Gastro-esophageal reflux disease without esophagitis: Secondary | ICD-10-CM

## 2022-12-22 ENCOUNTER — Telehealth: Payer: Self-pay

## 2022-12-22 NOTE — Telephone Encounter (Signed)
Pt was notified about eye exam being negative. No Retinopathy.

## 2023-01-15 ENCOUNTER — Other Ambulatory Visit: Payer: Self-pay | Admitting: Nurse Practitioner

## 2023-01-15 DIAGNOSIS — K219 Gastro-esophageal reflux disease without esophagitis: Secondary | ICD-10-CM

## 2023-01-16 ENCOUNTER — Other Ambulatory Visit: Payer: Self-pay | Admitting: Nurse Practitioner

## 2023-01-16 DIAGNOSIS — Z79899 Other long term (current) drug therapy: Secondary | ICD-10-CM

## 2023-01-24 ENCOUNTER — Ambulatory Visit (INDEPENDENT_AMBULATORY_CARE_PROVIDER_SITE_OTHER): Payer: 59 | Admitting: Nurse Practitioner

## 2023-01-24 ENCOUNTER — Ambulatory Visit
Admission: RE | Admit: 2023-01-24 | Discharge: 2023-01-24 | Disposition: A | Discharge status: Home / Self Care | Payer: 59 | Source: Ambulatory Visit | Attending: Nurse Practitioner | Admitting: Nurse Practitioner

## 2023-01-24 ENCOUNTER — Encounter: Payer: Self-pay | Admitting: Nurse Practitioner

## 2023-01-24 VITALS — BP 136/78 | HR 70 | Temp 98.3°F | Resp 16 | Ht 69.0 in | Wt 223.4 lb

## 2023-01-24 DIAGNOSIS — R1901 Right upper quadrant abdominal swelling, mass and lump: Secondary | ICD-10-CM | POA: Diagnosis not present

## 2023-01-24 DIAGNOSIS — E1169 Type 2 diabetes mellitus with other specified complication: Secondary | ICD-10-CM | POA: Diagnosis not present

## 2023-01-24 DIAGNOSIS — R17 Unspecified jaundice: Secondary | ICD-10-CM | POA: Insufficient documentation

## 2023-01-24 DIAGNOSIS — E1165 Type 2 diabetes mellitus with hyperglycemia: Secondary | ICD-10-CM | POA: Diagnosis not present

## 2023-01-24 DIAGNOSIS — R1011 Right upper quadrant pain: Secondary | ICD-10-CM

## 2023-01-24 DIAGNOSIS — M549 Dorsalgia, unspecified: Secondary | ICD-10-CM | POA: Insufficient documentation

## 2023-01-24 DIAGNOSIS — K760 Fatty (change of) liver, not elsewhere classified: Secondary | ICD-10-CM | POA: Diagnosis not present

## 2023-01-24 DIAGNOSIS — R160 Hepatomegaly, not elsewhere classified: Secondary | ICD-10-CM | POA: Diagnosis not present

## 2023-01-24 DIAGNOSIS — Z794 Long term (current) use of insulin: Secondary | ICD-10-CM

## 2023-01-24 LAB — POCT GLYCOSYLATED HEMOGLOBIN (HGB A1C): Hemoglobin A1C: 10.8 % — AB (ref 4.0–5.6)

## 2023-01-24 LAB — POCT I-STAT CREATININE: Creatinine, Ser: 1 mg/dL (ref 0.61–1.24)

## 2023-01-24 MED ORDER — ACCU-CHEK GUIDE ME W/DEVICE KIT
PACK | 0 refills | Status: AC
Start: 2023-01-24 — End: ?

## 2023-01-24 MED ORDER — INSULIN LISPRO 200 UNIT/ML ~~LOC~~ SOPN
PEN_INJECTOR | SUBCUTANEOUS | 5 refills | Status: DC
Start: 2023-01-24 — End: 2023-07-09

## 2023-01-24 MED ORDER — DAPAGLIFLOZIN PROPANEDIOL 10 MG PO TABS
10.0000 mg | ORAL_TABLET | Freq: Every day | ORAL | 2 refills | Status: DC
Start: 2023-01-24 — End: 2023-10-24

## 2023-01-24 MED ORDER — SEMAGLUTIDE (1 MG/DOSE) 4 MG/3ML ~~LOC~~ SOPN: 1.0000 mg | PEN_INJECTOR | SUBCUTANEOUS | 1 refills | Status: DC

## 2023-01-24 MED ORDER — ACCU-CHEK GUIDE VI STRP: ORAL_STRIP | 12 refills | Status: DC

## 2023-01-24 MED ORDER — IOHEXOL 300 MG/ML  SOLN
100.0000 mL | Freq: Once | INTRAMUSCULAR | Status: AC | PRN
  Administered 2023-01-24: 100 mL via INTRAVENOUS

## 2023-01-24 MED ORDER — OXYCODONE HCL 5 MG PO TABS: 5.0000 mg | ORAL_TABLET | Freq: Three times a day (TID) | ORAL | 0 refills | Status: DC | PRN

## 2023-01-24 MED ORDER — ACCU-CHEK SOFTCLIX LANCETS MISC
12 refills | Status: AC
Start: 2023-01-24 — End: ?

## 2023-01-24 NOTE — Progress Notes (Signed)
[facility] [address] [place], [place] [address]  Internal MEDICINE  Office Visit Note  Patient Name: Raymond [name]  [id number]  [id number]  Date of Service: 01/24/2023  Chief Complaint  Patient presents with   Depression   Diabetes   Gastroesophageal Reflux   Hyperlipidemia   Hypertension   Follow-up    HPI Raymond [name] presents for a follow-up visit for diabetes and RUQ pain  Diabetes -- average glucose readings 225-230. Has a broken glucose meter at home and [employer] temporarily ran out of freestyle libre 3 Swelling, tenderness and pain over RUQ of abdomen, jaundice and right CVA tenderness, area feels firm, possible mass or enlarged liver.   Nutrition Risk Assessment:  Has the patient had any N/V/D within the last 2 months?  Yes , only nausea  Does the patient have any non-healing wounds?  No  Has the patient had any unintentional weight loss or weight gain?  Yes  Has the patient had any polyphagia, polydipsia, or polyuria in the past 2 months?  Yes , polyuria and polydipsia  Are you eating regular meals 3 times a day with small healthy protein snacks?  No reports eating one meal a day. Goes to food bank and they give patient a lot of sweets, cookies and cakes and gets $27 on food stamps but his application expired and he needs to reapply  Are you following a specific type of diet?  Yes tries to follow diabetic diet Do you use intermittent fasting? no  Diabetes:  Is the patient diabetic?  Yes  If diabetic, was a CBG obtained today?  Yes  225-230 Most recent Hgb A1c?  Lab Results  Component Value Date   HGBA1C 10.8 (A) 01/24/2023   HGBA1C 8.8 (H) 10/25/2022   HGBA1C 7.7 (A) 07/27/2022   Does the patient use a CGM such as Dexcom or Freestyle Libre?  Yes  Did the patient bring their glucometer from home, CGM receiver or a record of their glucose readings?  No  glucose meter is broken and had run out of sensors for a while due to stock at pharmacy, just  picked up refill.  How often do they monitor their glucose level? 4 times daily -- in AM and before meals .  Are they on any insulin?  Yes  Basal, preprandial bolus or both? No just basal, takes toujeo 70 units daily.  Have you had any significant hypoglycemic events since your last visit (glucose <70) requiring intervention?  Have you been hospitalized or visited the ER due to you diabetes since your last office visit?  Are you experiencing any adverse side effects of your diabetes medications?   Financial Strains and Diabetes Management:  Are you having any financial strains with devices, supplies or your medication? Yes .  Does the patient want to be seen by Chronic Care Management for management of their diabetes?  No  Would the patient like additional information/handouts about diabetes or any specific related topics?  No  Would the patient like to be referred to a Nutritionist? No    Diabetic Exams:  Diabetic Eye Exam: Patient had eye exam in August this year Diabetic Foot Exam: Completed yearly with annual exam.    Current Medication: Outpatient Encounter Medications as of 01/24/2023  Medication Sig   Accu-Chek Softclix Lancets lancets Use 1 lancet to check glucose once daily and as needed for diabetes   acetaminophen (TYLENOL) 500 MG tablet Take 2 tablets (1,000 mg total) by mouth 4 (four)  times daily.   albuterol (VENTOLIN HFA) 108 (90 Base) MCG/ACT inhaler INHALE 2 PUFFS INTO THE LUNGS EVERY 6 HOURS AS MEEDED FOR WHEEZING OR SHORTNESS OF BREATH   aspirin EC 81 MG tablet Take 81 mg by mouth daily at 3 pm.    Blood Glucose Monitoring Suppl (ACCU-CHEK GUIDE ME) w/Device KIT Use as directed. E11.65   BREZTRI AEROSPHERE 160-9-4.8 MCG/ACT AERO INHALE 2 PUFFS TWICE DAILY   buPROPion (WELLBUTRIN XL) 150 MG 24 hr tablet Take 1 tablet (150 mg total) by mouth daily. Take along with 300 mg daily   buPROPion (WELLBUTRIN XL) 300 MG 24 hr tablet Take 1 tablet (300 mg total) by mouth daily.  Take along with 150 mg daily   Continuous Blood Gluc Sensor (FREESTYLE LIBRE 3 SENSOR) MISC Apply 1 sensor to the skin every 14 days   DULoxetine (CYMBALTA) 60 MG capsule Take 1 capsule (60 mg total) by mouth daily. Take along with 30 mg daily   ezetimibe (ZETIA) 10 MG tablet Take 1 tablet (10 mg total) by mouth daily.   famotidine (PEPCID) 20 MG tablet Take 1 tablet by mouth twice daily   glucose blood (ACCU-CHEK GUIDE) test strip Use 1 test strip to check fasting glucose once daily and as need for diabetes   insulin glargine, 2 Unit Dial, (TOUJEO MAX SOLOSTAR) 300 UNIT/ML Solostar Pen Inject 70 Units into the skin daily.   insulin lispro (HUMALOG) 200 UNIT/ML KwikPen Inject insulin into the skin with meals 3 times daily per sliding scale provided to patient. Max 24 hr dose: 42 units   Insulin Pen Needle 32G X 4 MM MISC Use with daily dose insulin and with sliding scale insulin as needed.   lidocaine (LIDODERM) 5 % Place 1 patch onto the skin daily. Remove & Discard patch within 12 hours or as directed by MD   Melatonin 10 MG CAPS Take 30 mg by mouth at bedtime.   metoprolol tartrate (LOPRESSOR) 100 MG tablet Take 1 tablet (100 mg total) by mouth 2 (two) times daily.   nitroGLYCERIN (NITROSTAT) 0.4 MG SL tablet Place 1 tablet (0.4 mg total) under the tongue every 5 (five) minutes x 3 doses as needed for chest pain.   pantoprazole (PROTONIX) 40 MG tablet Take 1 tablet by mouth once daily   Semaglutide, 1 MG/DOSE, 4 MG/3ML SOPN Inject 1 mg into the skin once a week.   tamsulosin (FLOMAX) 0.4 MG CAPS capsule Take 1 capsule by mouth once daily   temazepam (RESTORIL) 30 MG capsule TAKE 1 CAPSULE BY MOUTH AT BEDTIME AS NEEDED FOR SLEEP   [DISCONTINUED] ACCU-CHEK FASTCLIX LANCETS MISC yse as directed twice a day   [DISCONTINUED] atorvastatin (LIPITOR) 40 MG tablet Take 1 tablet (40 mg total) by mouth daily.   [DISCONTINUED] dapagliflozin propanediol (FARXIGA) 10 MG TABS tablet Take 1 tablet (10 mg  total) by mouth daily before breakfast.   [DISCONTINUED] glucose blood (ACCU-CHEK AVIVA PLUS) test strip Blood sugar testing TID and as needed. E11.65   [DISCONTINUED] oxyCODONE (OXY IR/ROXICODONE) 5 MG immediate release tablet Take 1 tablet (5 mg total) by mouth every 8 (eight) hours as needed for severe pain.   [DISCONTINUED] Semaglutide 7 MG TABS Take 1 tablet (7 mg total) by mouth daily before breakfast. On empty stomach with no more than 4 oz of water, wait 30 min before eating or drinking.   [DISCONTINUED] Varenicline Tartrate, Starter, 0.5 MG X 11 & 1 MG X 42 TBPK Take 0.5 mg by mouth once daily  on days 1-3, then take 0.5 mg twice daily on days 4-7, then increase to 1 mg twice daily   dapagliflozin propanediol (FARXIGA) 10 MG TABS tablet Take 1 tablet (10 mg total) by mouth daily before breakfast.   gabapentin (NEURONTIN) 300 MG capsule Take 1 capsule (300 mg total) by mouth 3 (three) times daily.   [DISCONTINUED] oxyCODONE (OXY IR/ROXICODONE) 5 MG immediate release tablet Take 1 tablet (5 mg total) by mouth every 8 (eight) hours as needed for severe pain. (Patient not taking: Reported on 02/22/2023)   No facility-administered encounter medications on file as of 01/24/2023.    Surgical History: Past Surgical History:  Procedure Laterality Date    ingrown toenail removal     CHOLECYSTECTOMY N/A 01/23/2019   Procedure: LAPAROSCOPIC CHOLECYSTECTOMY;  Surgeon: Henrene Dodge, MD;  Location: [facility] ORS;  Service: General;  Laterality: N/A;   CLAVICLE SURGERY Right    COLONOSCOPY  2017   COLONOSCOPY WITH PROPOFOL N/A 08/25/2020   Procedure: COLONOSCOPY WITH PROPOFOL;  Surgeon: Pasty Spillers, MD;  Location: [facility] END[facility]COPY;  Service: Endoscopy;  Laterality: N/A;   ESOPHAGOGASTRODUODENOSCOPY (EGD) WITH PROPOFOL N/A 08/25/2020   Procedure: ESOPHAGOGASTRODUODENOSCOPY (EGD) WITH PROPOFOL;  Surgeon: Pasty Spillers, MD;  Location: [facility] ENDOSCOPY;  Service: Endoscopy;  Laterality: N/A;   IR  THORACENTESIS ASP PLEURAL SPACE W/IMG GUIDE  11/20/2022   LYMPH NODE DISSECTION Right 10/27/2022   Procedure: LYMPH NODE DISSECTION;  Surgeon: Loreli Slot, MD;  Location: [facility] OR;  Service: Thoracic;  Laterality: Right;   MOUTH SURGERY     REMOVED ALL TEETH   SHOULDER SURGERY Right    UPPER GI ENDOSCOPY  2017   XI ROBOTIC ASSISTED THORACOSCOPY- SEGMENTECTOMY Right 10/27/2022   Procedure: XI ROBOTIC ASSISTED THORACOSCOPY-RIGHT LOWER LOBE SUPERIOR SEGMENTECTOMY;  Surgeon: Loreli Slot, MD;  Location: [facility] OR;  Service: Thoracic;  Laterality: Right;    Medical History: Past Medical History:  Diagnosis Date   Anxiety    Arthritis    NECK   Bronchitis    Coronary artery disease    Depression    Diabetes mellitus without complication (HCC)    Diastasis of rectus abdominis 06/19/2018   Dyspnea    DUE TO GALLBLADDER PER PT   Dysrhythmia    "fast hear rate at times. stopped when placed on metoprolol"   Emphysema lung (HCC)    Family history of adverse reaction to anesthesia    N/V, TROUBLE BREATHING COMING OUT OF ANESTHESIA   Fatty liver    GERD (gastroesophageal reflux disease)    Headache    MIGRAINES   Hyperlipidemia    Hypertension    Irregular heart beat unk   Myocardial infarction (HCC) 2010   MILD    PTSD (post-traumatic stress disorder)    Sleep apnea    USES CPAP   Tachycardia    Ventral hernia     Family History: Family History  Problem Relation Age of Onset   Hypertension Mother    Diabetes type II Mother    Diabetes Mother    COPD Father    Lung cancer Father    Heart disease Father    Diabetes Sister    Anxiety disorder Sister    Depression Sister    Diabetes Sister    Anxiety disorder Sister    Depression Sister    Diabetes Sister    Anxiety disorder Sister    Depression Sister    Diabetes Sister    Anxiety disorder Sister    Depression  Sister    Diabetes Brother    Anxiety disorder Brother    Depression Brother    Diabetes Maternal  Aunt    Colon cancer Maternal Uncle     Social History   Socioeconomic History   Marital status: Divorced    Spouse name: Not on file   Number of children: 4   Years of education: Not on file   Highest education level: 8th grade  Occupational History   Not on file  Tobacco Use   Smoking status: Every Day    Current packs/day: 0.25    Average packs/day: 0.3 packs/day for 40.0 years (10.0 ttl pk-yrs)    Types: Cigarettes   Smokeless tobacco: Former    Types: Chew   Tobacco comments:    2-3 cigarettes per day   Vaping Use   Vaping status: Former  Substance and Sexual Activity   Alcohol use: No    Alcohol/week: 0.0 standard drinks of alcohol   Drug use: No   Sexual activity: Not Currently  Other Topics Concern   Not on file  Social History Narrative   Not on file   Social Determinants of Health   Financial Resource Strain: Low Risk  (08/17/2020)   Overall Financial Resource Strain (CARDIA)    Difficulty of Paying Living Expenses: Not very hard  Food Insecurity: Food Insecurity Present (12/14/2022)   Hunger Vital Sign    Worried About Running Out of Food in the Last Year: Often true    Ran Out of Food in the Last Year: Often true  Transportation Needs: No Transportation Needs (12/14/2022)   PRAPARE - Administrator, Civil Service (Medical): No    Lack of Transportation (Non-Medical): No  Physical Activity: Inactive (05/23/2018)   Exercise Vital Sign    Days of Exercise per Week: 0 days    Minutes of Exercise per Session: 0 min  Stress: Stress Concern Present (05/23/2018)   [employer] of Occupational Health - Occupational Stress Questionnaire    Feeling of Stress : Very much  Social Connections: Unknown (05/23/2018)   Social Connection and Isolation Panel [NHANES]    Frequency of Communication with Friends and Family: Not on file    Frequency of Social Gatherings with Friends and Family: Not on file    Attends Religious Services: Never    Active  Member of Clubs or Organizations: No    Attends Banker Meetings: Never    Marital Status: Divorced  Catering manager Violence: Not At Risk (12/14/2022)   Humiliation, Afraid, Rape, and Kick questionnaire    Fear of Current or Ex-Partner: No    Emotionally Abused: No    Physically Abused: No    Sexually Abused: No      Review of Systems  Constitutional:  Negative for chills, fatigue and unexpected weight change.  HENT:  Negative for congestion, rhinorrhea, sneezing and sore throat.   Eyes:  Negative for redness.  Respiratory: Negative.  Negative for cough, chest tightness, shortness of breath and wheezing.   Cardiovascular: Negative.  Negative for chest pain and palpitations.  Gastrointestinal:  Positive for abdominal pain (RUQ). Negative for constipation, diarrhea, nausea and vomiting.  Genitourinary:  Positive for flank pain (right). Negative for dysuria and frequency.  Musculoskeletal:  Negative for arthralgias, back pain, joint swelling and neck pain.  Skin:  Positive for color change (slight jaundice). Negative for rash.  Neurological: Negative.  Negative for tremors and numbness.  Hematological:  Negative for adenopathy. Does not bruise/bleed  easily.  Psychiatric/Behavioral:  Negative for behavioral problems (Depression), self-injury, sleep disturbance and suicidal ideas. The patient is not nervous/anxious.     Vital Signs: BP 136/78   Pulse 70   Temp 98.3 F (36.8 C)   Resp 16   Ht 5\' 9"  (1.753 m)   Wt 223 lb 6.4 oz (101.3 kg)   SpO2 97%   BMI 32.99 kg/m    Physical Exam Vitals reviewed.  Constitutional:      General: He is not in acute distress.    Appearance: Normal appearance. He is obese. He is not ill-appearing.  HENT:     Head: Normocephalic and atraumatic.  Eyes:     Pupils: Pupils are equal, round, and reactive to light.  Cardiovascular:     Rate and Rhythm: Normal rate and regular rhythm.  Pulmonary:     Effort: Pulmonary effort is  normal. No respiratory distress.  Abdominal:     General: Bowel sounds are normal. There is distension.     Palpations: Abdomen is soft. There is hepatomegaly.     Tenderness: There is abdominal tenderness in the right upper quadrant. There is right CVA tenderness. There is no left CVA tenderness.  Neurological:     Mental Status: He is alert and oriented to person, place, and time.  Psychiatric:        Mood and Affect: Mood normal.        Behavior: Behavior normal.        Assessment/Plan: 1. Type 2 diabetes mellitus with other specified complication, with long-term current use of insulin (HCC) Switched from rybelsus to ozempic 1 mg weekly, and continue farxiga as prescribed. Supplies ordered, continue checking glucose daily and as needed. Continue insulin as prescribed.  - POCT glycosylated hemoglobin (Hb A1C) - Semaglutide, 1 MG/DOSE, 4 MG/3ML SOPN; Inject 1 mg into the skin once a week.  Dispense: 9 mL; Refill: 1 - dapagliflozin propanediol (FARXIGA) 10 MG TABS tablet; Take 1 tablet (10 mg total) by mouth daily before breakfast.  Dispense: 90 tablet; Refill: 2 - Blood Glucose Monitoring Suppl (ACCU-CHEK GUIDE ME) w/Device KIT; Use as directed. E11.65  Dispense: 1 kit; Refill: 0 - glucose blood (ACCU-CHEK GUIDE) test strip; Use 1 test strip to check fasting glucose once daily and as need for diabetes  Dispense: 100 each; Refill: 12 - Accu-Chek Softclix Lancets lancets; Use 1 lancet to check glucose once daily and as needed for diabetes  Dispense: 100 each; Refill: 12 - insulin lispro (HUMALOG) 200 UNIT/ML KwikPen; Inject insulin into the skin with meals 3 times daily per sliding scale provided to patient. Max 24 hr dose: 42 units  Dispense: 3 mL; Refill: 5  2. Jaundice of recent onset CT abdomen pelvis and additional labs ordered for further evaluation  - CT ABDOMEN PELVIS W CONTRAST; Future - CMP14+EGFR - Acute Hep Panel & Hep B Surface Ab  3. RUQ abdominal mass CT abdomen pelvis  and additional labs ordered for further evaluation  - CT ABDOMEN PELVIS W CONTRAST; Future - CMP14+EGFR - Acute Hep Panel & Hep B Surface Ab  4. RUQ pain CT abdomen pelvis and additional labs ordered for further evaluation  - CT ABDOMEN PELVIS W CONTRAST; Future - CMP14+EGFR - Acute Hep Panel & Hep B Surface Ab  5. Costovertebral angle tenderness CT abdomen pelvis and additional labs ordered for further evaluation  - CT ABDOMEN PELVIS W CONTRAST; Future - CMP14+EGFR - Acute Hep Panel & Hep B Surface Ab   General Counseling:  Zedric verbalizes understanding of the findings of todays visit and agrees with plan of treatment. I have discussed any further diagnostic evaluation that may be needed or ordered today. We also reviewed his medications today. he has been encouraged to call the office with any questions or concerns that should arise related to todays visit.    Orders Placed This Encounter  Procedures   CT ABDOMEN PELVIS W CONTRAST   CMP14+EGFR   Acute Hep Panel & Hep B Surface Ab   POCT glycosylated hemoglobin (Hb A1C)    Meds ordered this encounter  Medications   Semaglutide, 1 MG/DOSE, 4 MG/3ML SOPN    Sig: Inject 1 mg into the skin once a week.    Dispense:  9 mL    Refill:  1    Dx code E11.65, A1c not controlled with rybelsus due to adherence issues, so need to switch back to ozempic for better control.   dapagliflozin propanediol (FARXIGA) 10 MG TABS tablet    Sig: Take 1 tablet (10 mg total) by mouth daily before breakfast.    Dispense:  90 tablet    Refill:  2   Blood Glucose Monitoring Suppl (ACCU-CHEK GUIDE ME) w/Device KIT    Sig: Use as directed. E11.65    Dispense:  1 kit    Refill:  0   glucose blood (ACCU-CHEK GUIDE) test strip    Sig: Use 1 test strip to check fasting glucose once daily and as need for diabetes    Dispense:  100 each    Refill:  12   Accu-Chek Softclix Lancets lancets    Sig: Use 1 lancet to check glucose once daily and as needed  for diabetes    Dispense:  100 each    Refill:  12   DISCONTD: oxyCODONE (OXY IR/ROXICODONE) 5 MG immediate release tablet    Sig: Take 1 tablet (5 mg total) by mouth every 8 (eight) hours as needed for severe pain.    Dispense:  15 tablet    Refill:  0   insulin lispro (HUMALOG) 200 UNIT/ML KwikPen    Sig: Inject insulin into the skin with meals 3 times daily per sliding scale provided to patient. Max 24 hr dose: 42 units    Dispense:  3 mL    Refill:  5    Fill new script today    Return in about 1 month (around 02/24/2023) for F/U, Teran Daughenbaugh PCP diabetes .   Total time spent:30 Minutes Time spent includes review of chart, medications, test results, and follow up plan with the patient.   [place] Controlled Substance Database was reviewed by me.  This patient was seen by Sallyanne Kuster, FNP-C in collaboration with Dr. Beverely Risen as a part of collaborative care agreement.   Ronda Rajkumar R. Tedd Sias, MSN, FNP-C Internal medicine

## 2023-01-25 LAB — CMP14+EGFR
ALT: 18 [IU]/L (ref 0–44)
AST: 18 [IU]/L (ref 0–40)
Albumin: 4.3 g/dL (ref 3.8–4.9)
Alkaline Phosphatase: 146 [IU]/L — ABNORMAL HIGH (ref 44–121)
BUN/Creatinine Ratio: 12 (ref 9–20)
BUN: 14 mg/dL (ref 6–24)
Bilirubin Total: 0.2 mg/dL (ref 0.0–1.2)
CO2: 22 mmol/L (ref 20–29)
Calcium: 9.8 mg/dL (ref 8.7–10.2)
Chloride: 95 mmol/L — ABNORMAL LOW (ref 96–106)
Creatinine, Ser: 1.13 mg/dL (ref 0.76–1.27)
Globulin, Total: 2.7 g/dL (ref 1.5–4.5)
Glucose: 335 mg/dL — ABNORMAL HIGH (ref 70–99)
Potassium: 5 mmol/L (ref 3.5–5.2)
Sodium: 135 mmol/L (ref 134–144)
Total Protein: 7 g/dL (ref 6.0–8.5)
eGFR: 75 mL/min/{1.73_m2} (ref >59)

## 2023-01-25 LAB — ACUTE HEP PANEL AND HEP B SURFACE AB
Hep A IgM: NEGATIVE
Hep B C IgM: NEGATIVE
Hep C Virus Ab: NONREACTIVE
Hepatitis B Surf Ab Quant: <3.5 m[IU]/mL — ABNORMAL LOW
Hepatitis B Surface Ag: NEGATIVE

## 2023-02-09 ENCOUNTER — Other Ambulatory Visit: Payer: Self-pay

## 2023-02-12 ENCOUNTER — Other Ambulatory Visit: Payer: Self-pay | Admitting: Thoracic Surgery (Cardiothoracic Vascular Surgery)

## 2023-02-12 DIAGNOSIS — R911 Solitary pulmonary nodule: Secondary | ICD-10-CM

## 2023-02-13 ENCOUNTER — Encounter: Payer: Self-pay | Admitting: Thoracic Surgery (Cardiothoracic Vascular Surgery)

## 2023-02-13 ENCOUNTER — Ambulatory Visit
Admission: RE | Admit: 2023-02-13 | Discharge: 2023-02-13 | Disposition: A | Discharge status: Home / Self Care | Payer: 59 | Source: Ambulatory Visit | Attending: Thoracic Surgery (Cardiothoracic Vascular Surgery) | Admitting: Thoracic Surgery (Cardiothoracic Vascular Surgery)

## 2023-02-13 ENCOUNTER — Ambulatory Visit: Payer: 59 | Admitting: Thoracic Surgery (Cardiothoracic Vascular Surgery)

## 2023-02-13 VITALS — BP 142/78 | HR 110 | Resp 20 | Ht 69.0 in | Wt 222.8 lb

## 2023-02-13 DIAGNOSIS — J90 Pleural effusion, not elsewhere classified: Secondary | ICD-10-CM | POA: Diagnosis not present

## 2023-02-13 DIAGNOSIS — R911 Solitary pulmonary nodule: Secondary | ICD-10-CM

## 2023-02-13 DIAGNOSIS — Z9889 Other specified postprocedural states: Secondary | ICD-10-CM | POA: Diagnosis not present

## 2023-02-13 DIAGNOSIS — R059 Cough, unspecified: Secondary | ICD-10-CM | POA: Diagnosis not present

## 2023-02-13 DIAGNOSIS — J9811 Atelectasis: Secondary | ICD-10-CM | POA: Diagnosis not present

## 2023-02-13 NOTE — Progress Notes (Signed)
[address]       [name] [address]             [phone]       HPI: Raymond [name] returns for follow-up after recent right lower lobe superior segmentectomy  This Lung nodule found on low-dose CT for cancer screening.  Hypermetabolic on PET.  Underwent robotic assisted right lower lobe superior segmentectomy on 10/27/2022.  Was readmitted with chest pain and shortness of breath and had a moderate pleural effusion.  He underwent thoracentesis for that.  Last saw him on December 12, 2022.  He was doing well at that time.  Still had some pain but no evidence of recurrent pleural effusion.  In the interim since his last visit, no respiratory issues.  Complains of bulging in the right upper quadrant and unusual sensations in the area.  He is smoking 3 to 4 cigarettes/day, down from 1.5 packs/day.  Past Medical History:  Diagnosis Date   Anxiety    Arthritis    NECK   Bronchitis    Coronary artery disease    Depression    Diabetes mellitus without complication (HCC)    Diastasis of rectus abdominis 06/19/2018   Dyspnea    DUE TO GALLBLADDER PER PT   Dysrhythmia    "fast hear rate at times. stopped when placed on metoprolol"   Emphysema lung (HCC)    Family history of adverse reaction to anesthesia    N/V, TROUBLE BREATHING COMING OUT OF ANESTHESIA   Fatty liver    GERD (gastroesophageal reflux disease)    Headache    MIGRAINES   Hyperlipidemia    Hypertension    Irregular heart beat unk   Myocardial infarction (HCC) 2010   MILD    PTSD (post-traumatic stress disorder)    Sleep apnea    USES CPAP   Tachycardia    Ventral hernia     Current Outpatient Medications  Medication Sig Dispense Refill   Accu-Chek Softclix Lancets lancets Use 1 lancet to check glucose once daily and as needed for diabetes 100 each 12   acetaminophen (TYLENOL) 500 MG tablet Take 2 tablets (1,000 mg total) by mouth 4 (four) times daily. 30 tablet 0   albuterol (VENTOLIN  HFA) 108 (90 Base) MCG/ACT inhaler INHALE 2 PUFFS INTO THE LUNGS EVERY 6 HOURS AS MEEDED FOR WHEEZING OR SHORTNESS OF BREATH 18 g 1   aspirin EC 81 MG tablet Take 81 mg by mouth daily at 3 pm.      atorvastatin (LIPITOR) 40 MG tablet Take 1 tablet (40 mg total) by mouth daily. 90 tablet 0   Blood Glucose Monitoring Suppl (ACCU-CHEK GUIDE ME) w/Device KIT Use as directed. E11.65 1 kit 0   BREZTRI AEROSPHERE 160-9-4.8 MCG/ACT AERO INHALE 2 PUFFS TWICE DAILY 11 g 0   buPROPion (WELLBUTRIN XL) 150 MG 24 hr tablet Take 1 tablet (150 mg total) by mouth daily. Take along with 300 mg daily 90 tablet 1   buPROPion (WELLBUTRIN XL) 300 MG 24 hr tablet Take 1 tablet (300 mg total) by mouth daily. Take along with 150 mg daily 90 tablet 1   Continuous Blood Gluc Sensor (FREESTYLE LIBRE 3 SENSOR) MISC Apply 1 sensor to the skin every 14 days 6 each 3   dapagliflozin propanediol (FARXIGA) 10 MG TABS tablet Take 1 tablet (10 mg total) by mouth daily before breakfast. 90 tablet 2   DULoxetine (CYMBALTA) 60 MG capsule Take 1 capsule (60  mg total) by mouth daily. Take along with 30 mg daily 90 capsule 1   ezetimibe (ZETIA) 10 MG tablet Take 1 tablet (10 mg total) by mouth daily. 90 tablet 3   famotidine (PEPCID) 20 MG tablet Take 1 tablet by mouth twice daily 180 tablet 0   glucose blood (ACCU-CHEK GUIDE) test strip Use 1 test strip to check fasting glucose once daily and as need for diabetes 100 each 12   insulin glargine, 2 Unit Dial, (TOUJEO MAX SOLOSTAR) 300 UNIT/ML Solostar Pen Inject 70 Units into the skin daily. 18 mL 3   insulin lispro (HUMALOG) 200 UNIT/ML KwikPen Inject insulin into the skin with meals 3 times daily per sliding scale provided to patient. Max 24 hr dose: 42 units 3 mL 5   Insulin Pen Needle 32G X 4 MM MISC Use with daily dose insulin and with sliding scale insulin as needed. 100 each 5   lidocaine (LIDODERM) 5 % Place 1 patch onto the skin daily. Remove & Discard patch within 12 hours or as  directed by MD 30 patch 0   Melatonin 10 MG CAPS Take 30 mg by mouth at bedtime.     metoprolol tartrate (LOPRESSOR) 100 MG tablet Take 1 tablet (100 mg total) by mouth 2 (two) times daily. 180 tablet 1   nitroGLYCERIN (NITROSTAT) 0.4 MG SL tablet Place 1 tablet (0.4 mg total) under the tongue every 5 (five) minutes x 3 doses as needed for chest pain. 30 tablet 1   oxyCODONE (OXY IR/ROXICODONE) 5 MG immediate release tablet Take 1 tablet (5 mg total) by mouth every 8 (eight) hours as needed for severe pain. 15 tablet 0   pantoprazole (PROTONIX) 40 MG tablet Take 1 tablet by mouth once daily 90 tablet 0   Semaglutide, 1 MG/DOSE, 4 MG/3ML SOPN Inject 1 mg into the skin once a week. 9 mL 1   tamsulosin (FLOMAX) 0.4 MG CAPS capsule Take 1 capsule by mouth once daily 90 capsule 0   temazepam (RESTORIL) 30 MG capsule TAKE 1 CAPSULE BY MOUTH AT BEDTIME AS NEEDED FOR SLEEP 30 capsule 2   Varenicline Tartrate, Starter, 0.5 MG X 11 & 1 MG X 42 TBPK Take 0.5 mg by mouth once daily on days 1-3, then take 0.5 mg twice daily on days 4-7, then increase to 1 mg twice daily 53 each 0   gabapentin (NEURONTIN) 300 MG capsule Take 1 capsule (300 mg total) by mouth 3 (three) times daily. 90 capsule 0   No current facility-administered medications for this visit.    Physical Exam BP (!) 142/78 (BP Location: Left Arm, Patient Position: Sitting, Cuff Size: Normal)   Pulse (!) 110   Resp 20   Ht 5\' 9"  (1.753 m)   Wt 222 lb 12.8 oz (101.1 kg)   SpO2 98% Comment: RA  BMI 32.18 kg/m  57 year old man in no acute distress Alert and oriented x 3 with no focal deficits Cardiac tachycardic and regular Lungs slightly diminished at right base but otherwise clear Incisions well-healed, muscular laxity right upper quadrant  Diagnostic Tests: I personally reviewed his chest x-ray images.  Unchanged from his film from late August.  No significant pleural effusion.  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  Stage Ia adenocarcinoma right lower lobe-status post superior segmentectomy and  node dissection.  No adjuvant therapy.  He saw Dr. Michae Kava at [place].  She will does follow-up with a plan for a CT in 6 months.  Postoperative discomfort/paresthesias-currently on gabapentin.  Not taking narcotics.  I reassured him that the sensations are very common after this type of surgery and not an indication of any significant issues.  Is taking gabapentin but is not sure that is doing much.  I told him he could try to wean off of that if he would like but discussed stepwise discontinuation and not stopping all at once.  No restriction on his activities from my standpoint.  Tobacco use-unfortunately continues to smoke.  I cautioned him about the issues related to creep and the importance of quitting together.  Plan: Follow-up with oncology at [facility] I will be happy to see Mr. Shockley back anytime in the future if I can be of any further assistance with his care  Loreli Slot, MD [facility] [phone]

## 2023-02-15 ENCOUNTER — Other Ambulatory Visit: Payer: Self-pay | Admitting: Nurse Practitioner

## 2023-02-15 DIAGNOSIS — I700 Atherosclerosis of aorta: Secondary | ICD-10-CM

## 2023-02-19 ENCOUNTER — Other Ambulatory Visit: Payer: Self-pay

## 2023-02-22 ENCOUNTER — Ambulatory Visit: Payer: 59 | Admitting: Psychiatry

## 2023-02-22 ENCOUNTER — Encounter: Payer: Self-pay | Admitting: Psychiatry

## 2023-02-22 VITALS — BP 145/90 | HR 94 | Temp 97.9°F | Ht 69.0 in | Wt 221.8 lb

## 2023-02-22 DIAGNOSIS — F4310 Post-traumatic stress disorder, unspecified: Secondary | ICD-10-CM

## 2023-02-22 DIAGNOSIS — G4701 Insomnia due to medical condition: Secondary | ICD-10-CM

## 2023-02-22 DIAGNOSIS — F418 Other specified anxiety disorders: Secondary | ICD-10-CM | POA: Diagnosis not present

## 2023-02-22 DIAGNOSIS — F17200 Nicotine dependence, unspecified, uncomplicated: Secondary | ICD-10-CM

## 2023-02-22 DIAGNOSIS — F3342 Major depressive disorder, recurrent, in full remission: Secondary | ICD-10-CM | POA: Diagnosis not present

## 2023-02-22 MED ORDER — DULOXETINE HCL 30 MG PO CPEP: 30.0000 mg | ORAL_CAPSULE | Freq: Every day | ORAL | 3 refills | Status: DC

## 2023-02-22 MED ORDER — VARENICLINE TARTRATE 1 MG PO TABS
1.0000 mg | ORAL_TABLET | Freq: Two times a day (BID) | ORAL | 3 refills | Status: DC
Start: 2023-02-22 — End: 2023-06-08

## 2023-02-22 NOTE — Progress Notes (Signed)
[facility] MD OP Progress Note  02/22/2023 12:37 PM Raymond [name]  MRN:  [id number]  Chief Complaint:  Chief Complaint  Patient presents with   Follow-up   Depression   Anxiety   Medication Refill   HPI: Raymond [name] is a 57 year old Caucasian male, divorced, history of MDD, PTSD, insomnia, anxiety disorder, tobacco use disorder, hyperlipidemia, diabetes melitis, hepatic steatosis, recent diagnosis of lung cancer with right lower lobe superior segmentectomy-10/27/2022 was evaluated in office today.  Presented for follow-up.  Patient today reports he is recovering from the lung surgery.  He does have some chest tightness and pain on his right upper quadrant of his abdomen status post surgery.  Patient reports he is currently not on oxycodone.  Patient reports he knows may have to give it more time for the chest tightness to get better however agrees to reach out to his provider to discuss.  Reports he continues to follow up with oncology at [facility].  Patient reports overall mood symptoms otherwise are stable.  He is managing his anxiety symptoms well.  Denies any depression.  Patient reports sleep is overall good on the temazepam.  Patient reports he is interested in smoking cessation.  He does not have any Chantix left ,it may have helped to some extent.  Apparently 1/2 pack cigarette lasts for him for 2 days.  He has made a lot of progress.  Patient currently denies any suicidality, homicidality or perceptual disturbances.  Patient denies any other concerns today.  Visit Diagnosis:    ICD-10-CM   1. MDD (major depressive disorder), recurrent, in full remission (HCC)  F33.42 DULoxetine (CYMBALTA) 30 MG capsule    2. PTSD (post-traumatic stress disorder)  F43.10     3. Insomnia due to medical condition  G47.01    Pain    4. Other specified anxiety disorders  F41.8    Generalized anxiety not occurring more days than not    5. Tobacco use disorder  F17.200  varenicline (CHANTIX) 1 MG tablet      Past Psychiatric History: I have reviewed past psychiatric history from progress note on 05/23/2018.  Past trials of Tegretol, doxepin, Seroquel, mirtazapine, Belsomra, Lunesta, Ativan, risperidone, Lexapro.  Past Medical History:  Past Medical History:  Diagnosis Date   Anxiety    Arthritis    NECK   Bronchitis    Coronary artery disease    Depression    Diabetes mellitus without complication (HCC)    Diastasis of rectus abdominis 06/19/2018   Dyspnea    DUE TO GALLBLADDER PER PT   Dysrhythmia    "fast hear rate at times. stopped when placed on metoprolol"   Emphysema lung (HCC)    Family history of adverse reaction to anesthesia    N/V, TROUBLE BREATHING COMING OUT OF ANESTHESIA   Fatty liver    GERD (gastroesophageal reflux disease)    Headache    MIGRAINES   Hyperlipidemia    Hypertension    Irregular heart beat unk   Myocardial infarction (HCC) 2010   MILD    PTSD (post-traumatic stress disorder)    Sleep apnea    USES CPAP   Tachycardia    Ventral hernia     Past Surgical History:  Procedure Laterality Date    ingrown toenail removal     CHOLECYSTECTOMY N/A 01/23/2019   Procedure: LAPAROSCOPIC CHOLECYSTECTOMY;  Surgeon: Henrene Dodge, MD;  Location: [facility] ORS;  Service: General;  Laterality: N/A;   CLAVICLE SURGERY Right  COLONOSCOPY  2017   COLONOSCOPY WITH PROPOFOL N/A 08/25/2020   Procedure: COLONOSCOPY WITH PROPOFOL;  Surgeon: Pasty Spillers, MD;  Location: [facility] ENDOSCOPY;  Service: Endoscopy;  Laterality: N/A;   ESOPHAGOGASTRODUODENOSCOPY (EGD) WITH PROPOFOL N/A 08/25/2020   Procedure: ESOPHAGOGASTRODUODENOSCOPY (EGD) WITH PROPOFOL;  Surgeon: Pasty Spillers, MD;  Location: [facility] ENDOSCOPY;  Service: Endoscopy;  Laterality: N/A;   IR THORACENTESIS ASP PLEURAL SPACE W/IMG GUIDE  11/20/2022   LYMPH NODE DISSECTION Right 10/27/2022   Procedure: LYMPH NODE DISSECTION;  Surgeon: Loreli Slot, MD;  Location: [facility]  OR;  Service: Thoracic;  Laterality: Right;   MOUTH SURGERY     REMOVED ALL TEETH   SHOULDER SURGERY Right    UPPER GI ENDOSCOPY  2017   XI ROBOTIC ASSISTED THORACOSCOPY- SEGMENTECTOMY Right 10/27/2022   Procedure: XI ROBOTIC ASSISTED THORACOSCOPY-RIGHT LOWER LOBE SUPERIOR SEGMENTECTOMY;  Surgeon: Loreli Slot, MD;  Location: [facility] OR;  Service: Thoracic;  Laterality: Right;    Family Psychiatric History: I have reviewed family psychiatric history from progress note on 05/23/2018.  Family History:  Family History  Problem Relation Age of Onset   Hypertension Mother    Diabetes type II Mother    Diabetes Mother    COPD Father    Lung cancer Father    Heart disease Father    Diabetes Sister    Anxiety disorder Sister    Depression Sister    Diabetes Sister    Anxiety disorder Sister    Depression Sister    Diabetes Sister    Anxiety disorder Sister    Depression Sister    Diabetes Sister    Anxiety disorder Sister    Depression Sister    Diabetes Brother    Anxiety disorder Brother    Depression Brother    Diabetes Maternal Aunt    Colon cancer Maternal Uncle     Social History: I have reviewed social history from progress note on 05/23/2018. Social History   Socioeconomic History   Marital status: Divorced    Spouse name: Not on file   Number of children: 4   Years of education: Not on file   Highest education level: 8th grade  Occupational History   Not on file  Tobacco Use   Smoking status: Every Day    Current packs/day: 0.25    Average packs/day: 0.3 packs/day for 40.0 years (10.0 ttl pk-yrs)    Types: Cigarettes   Smokeless tobacco: Former    Types: Chew   Tobacco comments:    2-3 cigarettes per day as of October 25, 2022  Vaping Use   Vaping status: Former  Substance and Sexual Activity   Alcohol use: No    Alcohol/week: 0.0 standard drinks of alcohol   Drug use: No   Sexual activity: Not Currently  Other Topics Concern   Not on file  Social  History Narrative   Not on file   Social Determinants of Health   Financial Resource Strain: Low Risk  (08/17/2020)   Overall Financial Resource Strain (CARDIA)    Difficulty of Paying Living Expenses: Not very hard  Food Insecurity: Food Insecurity Present (12/14/2022)   Hunger Vital Sign    Worried About Running Out of Food in the Last Year: Often true    Ran Out of Food in the Last Year: Often true  Transportation Needs: No Transportation Needs (12/14/2022)   PRAPARE - Administrator, Civil Service (Medical): No    Lack of Transportation (Non-Medical): No  Physical Activity: Inactive (05/23/2018)   Exercise Vital Sign    Days of Exercise per Week: 0 days    Minutes of Exercise per Session: 0 min  Stress: Stress Concern Present (05/23/2018)   [employer] of Occupational Health - Occupational Stress Questionnaire    Feeling of Stress : Very much  Social Connections: Unknown (05/23/2018)   Social Connection and Isolation Panel [NHANES]    Frequency of Communication with Friends and Family: Not on file    Frequency of Social Gatherings with Friends and Family: Not on file    Attends Religious Services: Never    Active Member of Clubs or Organizations: No    Attends Banker Meetings: Never    Marital Status: Divorced    Allergies:  Allergies  Allergen Reactions   Onion Anaphylaxis   Norco [Hydrocodone-Acetaminophen] Itching   Nickel Rash   Nicoderm [Nicotine] Rash    Metabolic Disorder Labs: Lab Results  Component Value Date   HGBA1C 10.8 (A) 01/24/2023   MPG 205.86 10/25/2022   MPG 294.83 05/31/2017   No results found for: "PROLACTIN" Lab Results  Component Value Date   CHOL 151 11/11/2021   TRIG 251 (H) 11/11/2021   HDL 34 (L) 11/11/2021   CHOLHDL 4.4 11/11/2021   VLDL 26 12/09/2012   LDLCALC 76 11/11/2021   LDLCALC 153 (H) 08/17/2020   Lab Results  Component Value Date   TSH 1.890 08/17/2020   TSH 4.120 06/10/2018     Therapeutic Level Labs: No results found for: "LITHIUM" No results found for: "VALPROATE" No results found for: "CBMZ"  Current Medications: Current Outpatient Medications  Medication Sig Dispense Refill   Accu-Chek Softclix Lancets lancets Use 1 lancet to check glucose once daily and as needed for diabetes 100 each 12   acetaminophen (TYLENOL) 500 MG tablet Take 2 tablets (1,000 mg total) by mouth 4 (four) times daily. 30 tablet 0   albuterol (VENTOLIN HFA) 108 (90 Base) MCG/ACT inhaler INHALE 2 PUFFS INTO THE LUNGS EVERY 6 HOURS AS MEEDED FOR WHEEZING OR SHORTNESS OF BREATH 18 g 1   aspirin EC 81 MG tablet Take 81 mg by mouth daily at 3 pm.      atorvastatin (LIPITOR) 40 MG tablet Take 1 tablet by mouth once daily 90 tablet 0   Blood Glucose Monitoring Suppl (ACCU-CHEK GUIDE ME) w/Device KIT Use as directed. E11.65 1 kit 0   BREZTRI AEROSPHERE 160-9-4.8 MCG/ACT AERO INHALE 2 PUFFS TWICE DAILY 11 g 0   buPROPion (WELLBUTRIN XL) 150 MG 24 hr tablet Take 1 tablet (150 mg total) by mouth daily. Take along with 300 mg daily 90 tablet 1   buPROPion (WELLBUTRIN XL) 300 MG 24 hr tablet Take 1 tablet (300 mg total) by mouth daily. Take along with 150 mg daily 90 tablet 1   Continuous Blood Gluc Sensor (FREESTYLE LIBRE 3 SENSOR) MISC Apply 1 sensor to the skin every 14 days 6 each 3   dapagliflozin propanediol (FARXIGA) 10 MG TABS tablet Take 1 tablet (10 mg total) by mouth daily before breakfast. 90 tablet 2   DULoxetine (CYMBALTA) 30 MG capsule Take 1 capsule (30 mg total) by mouth daily. Take along with 60 mg daily , total of 90 mg daily 90 capsule 3   DULoxetine (CYMBALTA) 60 MG capsule Take 1 capsule (60 mg total) by mouth daily. Take along with 30 mg daily 90 capsule 1   ezetimibe (ZETIA) 10 MG tablet Take 1 tablet (10 mg total) by mouth  daily. 90 tablet 3   famotidine (PEPCID) 20 MG tablet Take 1 tablet by mouth twice daily 180 tablet 0   gabapentin (NEURONTIN) 300 MG capsule Take 1  capsule (300 mg total) by mouth 3 (three) times daily. 90 capsule 0   glucose blood (ACCU-CHEK GUIDE) test strip Use 1 test strip to check fasting glucose once daily and as need for diabetes 100 each 12   insulin glargine, 2 Unit Dial, (TOUJEO MAX SOLOSTAR) 300 UNIT/ML Solostar Pen Inject 70 Units into the skin daily. 18 mL 3   insulin lispro (HUMALOG) 200 UNIT/ML KwikPen Inject insulin into the skin with meals 3 times daily per sliding scale provided to patient. Max 24 hr dose: 42 units 3 mL 5   Insulin Pen Needle 32G X 4 MM MISC Use with daily dose insulin and with sliding scale insulin as needed. 100 each 5   lidocaine (LIDODERM) 5 % Place 1 patch onto the skin daily. Remove & Discard patch within 12 hours or as directed by MD 30 patch 0   Melatonin 10 MG CAPS Take 30 mg by mouth at bedtime.     metoprolol tartrate (LOPRESSOR) 100 MG tablet Take 1 tablet (100 mg total) by mouth 2 (two) times daily. 180 tablet 1   nitroGLYCERIN (NITROSTAT) 0.4 MG SL tablet Place 1 tablet (0.4 mg total) under the tongue every 5 (five) minutes x 3 doses as needed for chest pain. 30 tablet 1   pantoprazole (PROTONIX) 40 MG tablet Take 1 tablet by mouth once daily 90 tablet 0   Semaglutide, 1 MG/DOSE, 4 MG/3ML SOPN Inject 1 mg into the skin once a week. 9 mL 1   tamsulosin (FLOMAX) 0.4 MG CAPS capsule Take 1 capsule by mouth once daily 90 capsule 0   temazepam (RESTORIL) 30 MG capsule TAKE 1 CAPSULE BY MOUTH AT BEDTIME AS NEEDED FOR SLEEP 30 capsule 2   varenicline (CHANTIX) 1 MG tablet Take 1 tablet (1 mg total) by mouth 2 (two) times daily. 60 tablet 3   No current facility-administered medications for this visit.     Musculoskeletal: Strength & Muscle Tone: within normal limits Gait & Station: normal Patient leans: N/A  Psychiatric Specialty Exam: Review of Systems  Psychiatric/Behavioral: Negative.      Blood pressure (!) 145/90, pulse 94, temperature 97.9 F (36.6 C), temperature source Skin, height  5\' 9"  (1.753 m), weight 221 lb 12.8 oz (100.6 kg).Body mass index is 32.75 kg/m.  General Appearance: Fairly Groomed  Eye Contact:  Fair  Speech:  Clear and Coherent  Volume:  Normal  Mood:  Euthymic  Affect:  Congruent  Thought Process:  Goal Directed and Descriptions of Associations: Intact  Orientation:  Full (Time, Place, and Person)  Thought Content: Logical   Suicidal Thoughts:  No  Homicidal Thoughts:  No  Memory:  Immediate;   Fair Recent;   Fair Remote;   Fair  Judgement:  Fair  Insight:  Fair  Psychomotor Activity:  Normal  Concentration:  Concentration: Fair and Attention Span: Fair  Recall:  [employer] of Knowledge: Fair  Language: Fair  Akathisia:  No  Handed:  Right  AIMS (if indicated): not done  Assets:  Communication Skills Desire for Improvement Housing Social Support Transportation  ADL's:  Intact  Cognition: WNL  Sleep:  Fair   Screenings: GAD-7    Garment/textile technologist Visit from 02/22/2023 in [facility] Office Visit from 02/09/2022 in [facility]  [facility] Associates Video Visit from 12/20/2021 in [facility] Video Visit from 06/20/2021 in [facility] Office Visit from 11/22/2020 in [facility]  Total GAD-7 Score 5 7 6 8 11       Mini-Mental    Flowsheet Row Office Visit from 10/23/2022 in [facility], [facility] Office Visit from 10/19/2021 in [facility], [facility] Clinical Support from 10/18/2020 in [facility], [facility] Clinical Support from 10/17/2019 in [facility], [facility] Clinical Support from 10/08/2018 in [facility], [facility]  Total Score (max 30 points ) 30 30 29 30 27       PHQ2-9    Flowsheet Row Office Visit from 02/22/2023 in [facility] Office Visit from 12/14/2022 in [facility] Visit from 10/23/2022 in [facility], [facility] Office Visit from 02/09/2022 in [facility] Video Visit from 12/20/2021 in [facility]  PHQ-2 Total Score 2 1 1 2 1   PHQ-9 Total Score 7 -- -- 8 --      Flowsheet Row Office Visit from 02/22/2023 in [facility] ED to Hosp-Admission (Discharged) from 11/20/2022 in [facility] GENERAL MED/SURG UNIT Video Visit from 11/16/2022 in [facility]  C-SSRS RISK CATEGORY No Risk No Risk No Risk        Assessment and Plan: Raymond [name] is a 57 year old Caucasian male, lives in [place], divorced, has a history of MDD, insomnia, diabetes mellitus was evaluated in office today.  Patient is currently status post right lung segmentectomy for lung cancer, currently recovering, managing mood symptoms well, interested in smoking cessation, discussed Chantix retrial.  Plan as noted below.  Plan MDD in remission Cymbalta 90 mg p.o. daily Wellbutrin XL 450 mg p.o. daily  Other specified anxiety disorder-generalized anxiety not occurring more days than not-stable Cymbalta 90 mg p.o. daily  Insomnia-stable Temazepam 30 mg p.o. nightly Hydroxyzine 25-50 mg p.o. nightly as needed Continue CPAP for OSA-sleep study-01/05/2021 -Periodic limb movement. Reviewed [place] PMP AWARxE  PTSD-stable Cymbalta 90 mg p.o. daily  Tobacco use disorder-improving Provided counseling for 5 minutes.  Start Chantix 1 mg p.o. twice daily.  Provided medication education.  Patient previously tried it and tolerated it well.    Collaboration of Care: Collaboration of Care: Other patient encouraged to continue to follow up with oncology, patient to follow up with primary care provider for elevated blood pressure reading in session today.  Patient reports blood pressure  elevation likely due to his pain.  Patient agrees to monitor and reach out to primary care.  Patient/Guardian was advised Release of Information must be obtained prior to any record release in order to collaborate their care with an outside provider. Patient/Guardian was advised if they have not already done so to contact the registration department to sign all necessary forms in order for [place] to release information regarding their care.   Consent: Patient/Guardian gives verbal consent for treatment and assignment of benefits for services provided during this visit. Patient/Guardian expressed understanding and agreed to proceed.   Follow-up in clinic in 3 months or sooner if needed.  This note was generated in part or whole with voice recognition software. Voice recognition is usually quite accurate but there are transcription errors that can and very often do occur. I apologize for any typographical errors that were not  detected and corrected.    Jomarie Longs, MD 02/23/2023, 8:22 AM

## 2023-03-02 ENCOUNTER — Telehealth: Payer: Self-pay

## 2023-03-02 NOTE — Telephone Encounter (Signed)
-----   Message from [facility] sent at 03/02/2023  4:59 AM EST ----- Only mild fatty liver was found and rest of imaging was normal.  Please ask patient how his pain is. May need to consider some type of referral to some type of specialist.

## 2023-03-02 NOTE — Telephone Encounter (Signed)
Pt advised for Ctscan  result and also send message to alyssa that pt still having need to see specialist

## 2023-03-05 ENCOUNTER — Telehealth: Payer: Self-pay

## 2023-03-05 NOTE — Telephone Encounter (Signed)
Pt  was notified and made appt with dfk tomorrow

## 2023-03-05 NOTE — Telephone Encounter (Signed)
-----   Message from [facility] sent at 03/02/2023  4:59 AM EST ----- Only mild fatty liver was found and rest of imaging was normal.  Please ask patient how his pain is. May need to consider some type of referral to some type of specialist.

## 2023-03-06 ENCOUNTER — Ambulatory Visit (INDEPENDENT_AMBULATORY_CARE_PROVIDER_SITE_OTHER): Payer: 59 | Admitting: Internal Medicine

## 2023-03-06 ENCOUNTER — Encounter: Payer: Self-pay | Admitting: Internal Medicine

## 2023-03-06 VITALS — BP 130/90 | HR 93 | Temp 98.3°F | Resp 16 | Ht 69.0 in | Wt 220.8 lb

## 2023-03-06 DIAGNOSIS — E118 Type 2 diabetes mellitus with unspecified complications: Secondary | ICD-10-CM | POA: Diagnosis not present

## 2023-03-06 DIAGNOSIS — E1165 Type 2 diabetes mellitus with hyperglycemia: Secondary | ICD-10-CM | POA: Diagnosis not present

## 2023-03-06 DIAGNOSIS — R0781 Pleurodynia: Secondary | ICD-10-CM

## 2023-03-06 DIAGNOSIS — R300 Dysuria: Secondary | ICD-10-CM

## 2023-03-06 DIAGNOSIS — Z23 Encounter for immunization: Secondary | ICD-10-CM

## 2023-03-06 LAB — POCT URINALYSIS DIPSTICK
Bilirubin, UA: NEGATIVE
Glucose, UA: POSITIVE — AB
Ketones, UA: POSITIVE
Leukocytes, UA: NEGATIVE
Nitrite, UA: NEGATIVE
Protein, UA: POSITIVE — AB
Spec Grav, UA: 1.01 (ref 1.010–1.025)
Urobilinogen, UA: 0.2 U/dL
pH, UA: 5 (ref 5.0–8.0)

## 2023-03-06 MED ORDER — TRAMADOL HCL 50 MG PO TABS
ORAL_TABLET | ORAL | 3 refills | Status: DC
Start: 2023-03-06 — End: 2023-04-30

## 2023-03-06 NOTE — Progress Notes (Signed)
[facility] [address] [place], [place] [address]  Internal MEDICINE  Office Visit Note  Patient Name: Raymond [name]  [id number]  [id number]  Date of Service: 03/06/2023  Chief Complaint  Patient presents with   Follow-up    Review CT   Depression   Diabetes   Gastroesophageal Reflux   Hypertension   Hyperlipidemia   Abdominal Pain    Pain is in abdomen and back, did have lung procedure on 10/27/22.   Quality Metric Gaps    Shingles Vaccine    HPI  Pt is seen for diabetic follow up C/O right rib cage pain after lung cancer surgery, developed pl effusion  Diabetic numbers are better after Ozempic, less abdominal pain, trying to do better with diet    Current Medication: Outpatient Encounter Medications as of 03/06/2023  Medication Sig   Accu-Chek Softclix Lancets lancets Use 1 lancet to check glucose once daily and as needed for diabetes   acetaminophen (TYLENOL) 500 MG tablet Take 2 tablets (1,000 mg total) by mouth 4 (four) times daily.   albuterol (VENTOLIN HFA) 108 (90 Base) MCG/ACT inhaler INHALE 2 PUFFS INTO THE LUNGS EVERY 6 HOURS AS MEEDED FOR WHEEZING OR SHORTNESS OF BREATH   aspirin EC 81 MG tablet Take 81 mg by mouth daily at 3 pm.    atorvastatin (LIPITOR) 40 MG tablet Take 1 tablet by mouth once daily   Blood Glucose Monitoring Suppl (ACCU-CHEK GUIDE ME) w/Device KIT Use as directed. E11.65   BREZTRI AEROSPHERE 160-9-4.8 MCG/ACT AERO INHALE 2 PUFFS TWICE DAILY   buPROPion (WELLBUTRIN XL) 150 MG 24 hr tablet Take 1 tablet (150 mg total) by mouth daily. Take along with 300 mg daily   buPROPion (WELLBUTRIN XL) 300 MG 24 hr tablet Take 1 tablet (300 mg total) by mouth daily. Take along with 150 mg daily   Continuous Blood Gluc Sensor (FREESTYLE LIBRE 3 SENSOR) MISC Apply 1 sensor to the skin every 14 days   dapagliflozin propanediol (FARXIGA) 10 MG TABS tablet Take 1 tablet (10 mg total) by mouth daily before breakfast.   DULoxetine (CYMBALTA) 30  MG capsule Take 1 capsule (30 mg total) by mouth daily. Take along with 60 mg daily , total of 90 mg daily   DULoxetine (CYMBALTA) 60 MG capsule Take 1 capsule (60 mg total) by mouth daily. Take along with 30 mg daily   ezetimibe (ZETIA) 10 MG tablet Take 1 tablet (10 mg total) by mouth daily.   famotidine (PEPCID) 20 MG tablet Take 1 tablet by mouth twice daily   glucose blood (ACCU-CHEK GUIDE) test strip Use 1 test strip to check fasting glucose once daily and as need for diabetes   insulin glargine, 2 Unit Dial, (TOUJEO MAX SOLOSTAR) 300 UNIT/ML Solostar Pen Inject 70 Units into the skin daily.   insulin lispro (HUMALOG) 200 UNIT/ML KwikPen Inject insulin into the skin with meals 3 times daily per sliding scale provided to patient. Max 24 hr dose: 42 units   Insulin Pen Needle 32G X 4 MM MISC Use with daily dose insulin and with sliding scale insulin as needed.   lidocaine (LIDODERM) 5 % Place 1 patch onto the skin daily. Remove & Discard patch within 12 hours or as directed by MD   Melatonin 10 MG CAPS Take 30 mg by mouth at bedtime.   metoprolol tartrate (LOPRESSOR) 100 MG tablet Take 1 tablet (100 mg total) by mouth 2 (two) times daily.   nitroGLYCERIN (NITROSTAT) 0.4 MG  SL tablet Place 1 tablet (0.4 mg total) under the tongue every 5 (five) minutes x 3 doses as needed for chest pain.   pantoprazole (PROTONIX) 40 MG tablet Take 1 tablet by mouth once daily   Semaglutide, 1 MG/DOSE, 4 MG/3ML SOPN Inject 1 mg into the skin once a week.   tamsulosin (FLOMAX) 0.4 MG CAPS capsule Take 1 capsule by mouth once daily   temazepam (RESTORIL) 30 MG capsule TAKE 1 CAPSULE BY MOUTH AT BEDTIME AS NEEDED FOR SLEEP   traMADol (ULTRAM) 50 MG tablet Take one tab po bid for pain right rib cage   varenicline (CHANTIX) 1 MG tablet Take 1 tablet (1 mg total) by mouth 2 (two) times daily.   gabapentin (NEURONTIN) 300 MG capsule Take 1 capsule (300 mg total) by mouth 3 (three) times daily.   No  facility-administered encounter medications on file as of 03/06/2023.    Surgical History: Past Surgical History:  Procedure Laterality Date    ingrown toenail removal     CHOLECYSTECTOMY N/A 01/23/2019   Procedure: LAPAROSCOPIC CHOLECYSTECTOMY;  Surgeon: Henrene Dodge, MD;  Location: [facility] ORS;  Service: General;  Laterality: N/A;   CLAVICLE SURGERY Right    COLONOSCOPY  2017   COLONOSCOPY WITH PROPOFOL N/A 08/25/2020   Procedure: COLONOSCOPY WITH PROPOFOL;  Surgeon: Pasty Spillers, MD;  Location: [facility];  Service: Endoscopy;  Laterality: N/A;   ESOPHAGOGASTRODUODENOSCOPY (EGD) WITH PROPOFOL N/A 08/25/2020   Procedure: ESOPHAGOGASTRODUODENOSCOPY (EGD) WITH PROPOFOL;  Surgeon: Pasty Spillers, MD;  Location: [facility];  Service: Endoscopy;  Laterality: N/A;   IR THORACENTESIS ASP PLEURAL SPACE W/IMG GUIDE  11/20/2022   LYMPH NODE DISSECTION Right 10/27/2022   Procedure: LYMPH NODE DISSECTION;  Surgeon: Loreli Slot, MD;  Location: [facility] OR;  Service: Thoracic;  Laterality: Right;   MOUTH SURGERY     REMOVED ALL TEETH   SHOULDER SURGERY Right    UPPER GI ENDOSCOPY  2017   XI ROBOTIC ASSISTED THORACOSCOPY- SEGMENTECTOMY Right 10/27/2022   Procedure: XI ROBOTIC ASSISTED THORACOSCOPY-RIGHT LOWER LOBE SUPERIOR SEGMENTECTOMY;  Surgeon: Loreli Slot, MD;  Location: [facility] OR;  Service: Thoracic;  Laterality: Right;    Medical History: Past Medical History:  Diagnosis Date   Anxiety    Arthritis    NECK   Bronchitis    Coronary artery disease    Depression    Diabetes mellitus without complication (HCC)    Diastasis of rectus abdominis 06/19/2018   Dyspnea    DUE TO GALLBLADDER PER PT   Dysrhythmia    "fast hear rate at times. stopped when placed on metoprolol"   Emphysema lung (HCC)    Family history of adverse reaction to anesthesia    N/V, TROUBLE BREATHING COMING OUT OF ANESTHESIA   Fatty liver    GERD (gastroesophageal reflux disease)    Headache     MIGRAINES   Hyperlipidemia    Hypertension    Irregular heart beat unk   Myocardial infarction (HCC) 2010   MILD    PTSD (post-traumatic stress disorder)    Sleep apnea    USES CPAP   Tachycardia    Ventral hernia     Family History: Family History  Problem Relation Age of Onset   Hypertension Mother    Diabetes type II Mother    Diabetes Mother    COPD Father    Lung cancer Father    Heart disease Father    Diabetes Sister    Anxiety disorder Sister  Depression Sister    Diabetes Sister    Anxiety disorder Sister    Depression Sister    Diabetes Sister    Anxiety disorder Sister    Depression Sister    Diabetes Sister    Anxiety disorder Sister    Depression Sister    Diabetes Brother    Anxiety disorder Brother    Depression Brother    Diabetes Maternal Aunt    Colon cancer Maternal Uncle     Social History   Socioeconomic History   Marital status: Divorced    Spouse name: Not on file   Number of children: 4   Years of education: Not on file   Highest education level: 8th grade  Occupational History   Not on file  Tobacco Use   Smoking status: Every Day    Current packs/day: 0.25    Average packs/day: 0.3 packs/day for 40.0 years (10.0 ttl pk-yrs)    Types: Cigarettes   Smokeless tobacco: Former    Types: Chew   Tobacco comments:    2-3 cigarettes per day   Vaping Use   Vaping status: Former  Substance and Sexual Activity   Alcohol use: No    Alcohol/week: 0.0 standard drinks of alcohol   Drug use: No   Sexual activity: Not Currently  Other Topics Concern   Not on file  Social History Narrative   Not on file   Social Determinants of Health   Financial Resource Strain: Low Risk  (08/17/2020)   Overall Financial Resource Strain (CARDIA)    Difficulty of Paying Living Expenses: Not very hard  Food Insecurity: Food Insecurity Present (12/14/2022)   Hunger Vital Sign    Worried About Running Out of Food in the Last Year: Often true    Ran  Out of Food in the Last Year: Often true  Transportation Needs: No Transportation Needs (12/14/2022)   PRAPARE - Administrator, Civil Service (Medical): No    Lack of Transportation (Non-Medical): No  Physical Activity: Inactive (05/23/2018)   Exercise Vital Sign    Days of Exercise per Week: 0 days    Minutes of Exercise per Session: 0 min  Stress: Stress Concern Present (05/23/2018)   [employer] of Occupational Health - Occupational Stress Questionnaire    Feeling of Stress : Very much  Social Connections: Unknown (05/23/2018)   Social Connection and Isolation Panel [NHANES]    Frequency of Communication with Friends and Family: Not on file    Frequency of Social Gatherings with Friends and Family: Not on file    Attends Religious Services: Never    Active Member of Clubs or Organizations: No    Attends Banker Meetings: Never    Marital Status: Divorced  Catering manager Violence: Not At Risk (12/14/2022)   Humiliation, Afraid, Rape, and Kick questionnaire    Fear of Current or Ex-Partner: No    Emotionally Abused: No    Physically Abused: No    Sexually Abused: No      Review of Systems  Vital Signs: BP (!) 130/90   Pulse 93   Temp 98.3 F (36.8 C)   Resp 16   Ht 5\' 9"  (1.753 m)   Wt 220 lb 12.8 oz (100.2 kg)   SpO2 97%   BMI 32.61 kg/m    Physical Exam Constitutional:      Appearance: Normal appearance.  HENT:     Head: Normocephalic and atraumatic.     Nose: Nose normal.  Mouth/Throat:     Mouth: Mucous membranes are moist.     Pharynx: No posterior oropharyngeal erythema.  Eyes:     Extraocular Movements: Extraocular movements intact.     Pupils: Pupils are equal, round, and reactive to light.  Cardiovascular:     Pulses: Normal pulses.     Heart sounds: Normal heart sounds.  Pulmonary:     Effort: Pulmonary effort is normal.     Breath sounds: Normal breath sounds.  Abdominal:     Tenderness: There is abdominal  tenderness in the right upper quadrant.  Neurological:     General: No focal deficit present.     Mental Status: He is alert.  Psychiatric:        Mood and Affect: Mood normal.        Behavior: Behavior normal.        Assessment/Plan: 1. Poorly controlled type 2 diabetes mellitus with complication (HCC) Pt is to continue Toujeo, Humalog, Semaglutide and farxiga   - POCT Urinalysis Dipstick - Basic Metabolic Panel (BMET) - Hemoglobin A1c - Urine Microalbumin w/creat. ratio  2. Rib pain on right side Stressed about importance of breathing exercises, add low dose tramadol  - traMADol (ULTRAM) 50 MG tablet; Take one tab po bid for pain right rib cage  Dispense: 60 tablet; Refill: 3  3. Need for shingles vaccine - Zoster, Recombinant (Shingrix)   General Counseling: Red verbalizes understanding of the findings of todays visit and agrees with plan of treatment. I have discussed any further diagnostic evaluation that may be needed or ordered today. We also reviewed his medications today. he has been encouraged to call the office with any questions or concerns that should arise related to todays visit.    Orders Placed This Encounter  Procedures   Zoster, Recombinant (Shingrix)   Basic Metabolic Panel (BMET)   Hemoglobin A1c   Urine Microalbumin w/creat. ratio   POCT Urinalysis Dipstick    Meds ordered this encounter  Medications   traMADol (ULTRAM) 50 MG tablet    Sig: Take one tab po bid for pain right rib cage    Dispense:  60 tablet    Refill:  3    Total time spent:35 Minutes Time spent includes review of chart, medications, test results, and follow up plan with the patient.   [place] Controlled Substance Database was reviewed by me.   Dr Lyndon Code Internal medicine

## 2023-03-06 NOTE — Progress Notes (Signed)
Can we send microalbumin on him please

## 2023-03-07 ENCOUNTER — Other Ambulatory Visit: Payer: Self-pay | Admitting: Internal Medicine

## 2023-03-07 LAB — MICROALBUMIN / CREATININE URINE RATIO
Creatinine, Urine: 65.3 mg/dL
Microalb/Creat Ratio: 68 mg/g{creat} — ABNORMAL HIGH (ref 0–29)
Microalbumin, Urine: 44.7 ug/mL

## 2023-03-07 MED ORDER — ZOSTER VAC RECOMB ADJUVANTED 50 MCG/0.5ML IM SUSR: 0.5000 mL | Freq: Once | INTRAMUSCULAR | 0 refills | Status: AC

## 2023-03-07 NOTE — Addendum Note (Signed)
Addended by: Lyndon Code on: 03/07/2023 09:35 AM   Modules accepted: Orders

## 2023-03-16 ENCOUNTER — Encounter: Payer: Self-pay | Admitting: Nurse Practitioner

## 2023-03-21 ENCOUNTER — Other Ambulatory Visit: Payer: Self-pay | Admitting: Psychiatry

## 2023-03-21 DIAGNOSIS — F4310 Post-traumatic stress disorder, unspecified: Secondary | ICD-10-CM

## 2023-03-21 DIAGNOSIS — F3342 Major depressive disorder, recurrent, in full remission: Secondary | ICD-10-CM

## 2023-03-29 DIAGNOSIS — E118 Type 2 diabetes mellitus with unspecified complications: Secondary | ICD-10-CM | POA: Diagnosis not present

## 2023-03-29 DIAGNOSIS — E1165 Type 2 diabetes mellitus with hyperglycemia: Secondary | ICD-10-CM | POA: Diagnosis not present

## 2023-03-30 LAB — BASIC METABOLIC PANEL
BUN/Creatinine Ratio: 13 (ref 9–20)
BUN: 13 mg/dL (ref 6–24)
CO2: 25 mmol/L (ref 20–29)
Calcium: 9.8 mg/dL (ref 8.7–10.2)
Chloride: 99 mmol/L (ref 96–106)
Creatinine, Ser: 1.02 mg/dL (ref 0.76–1.27)
Glucose: 152 mg/dL — ABNORMAL HIGH (ref 70–99)
Potassium: 5.1 mmol/L (ref 3.5–5.2)
Sodium: 138 mmol/L (ref 134–144)
eGFR: 85 mL/min/{1.73_m2} (ref >59)

## 2023-03-30 LAB — HEMOGLOBIN A1C
Est. average glucose Bld gHb Est-mCnc: 212 mg/dL
Hgb A1c MFr Bld: 9 % — ABNORMAL HIGH (ref 4.8–5.6)

## 2023-04-02 ENCOUNTER — Ambulatory Visit (INDEPENDENT_AMBULATORY_CARE_PROVIDER_SITE_OTHER): Payer: 59 | Admitting: Nurse Practitioner

## 2023-04-02 ENCOUNTER — Encounter: Payer: Self-pay | Admitting: Nurse Practitioner

## 2023-04-02 VITALS — BP 130/84 | HR 95 | Temp 98.3°F | Resp 16 | Ht 69.0 in | Wt 217.2 lb

## 2023-04-02 DIAGNOSIS — I700 Atherosclerosis of aorta: Secondary | ICD-10-CM

## 2023-04-02 DIAGNOSIS — E1169 Type 2 diabetes mellitus with other specified complication: Secondary | ICD-10-CM | POA: Diagnosis not present

## 2023-04-02 DIAGNOSIS — Z23 Encounter for immunization: Secondary | ICD-10-CM | POA: Diagnosis not present

## 2023-04-02 DIAGNOSIS — Z794 Long term (current) use of insulin: Secondary | ICD-10-CM | POA: Diagnosis not present

## 2023-04-02 MED ORDER — ATORVASTATIN CALCIUM 40 MG PO TABS: 40.0000 mg | ORAL_TABLET | Freq: Every day | ORAL | 3 refills | Status: DC

## 2023-04-02 MED ORDER — DEXCOM G7 SENSOR MISC
11 refills | Status: DC
Start: 2023-04-02 — End: 2024-02-26

## 2023-04-02 MED ORDER — ZOSTER VAC RECOMB ADJUVANTED 50 MCG/0.5ML IM SUSR
0.5000 mL | Freq: Once | INTRAMUSCULAR | 0 refills | Status: AC
Start: 2023-04-02 — End: 2023-04-02

## 2023-04-02 NOTE — Progress Notes (Signed)
[facility] [address] [place], [place] [address]  Internal MEDICINE  Office Visit Note  Patient Name: Raymond [name]  [id number]  [id number]  Date of Service: 04/02/2023  Chief Complaint  Patient presents with   Depression   Diabetes   Gastroesophageal Reflux   Hyperlipidemia   Hypertension   Follow-up    HPI Raymond [name] presents for a follow-up visit for diabetes, aortic atherosclerosis and shingles vaccine.  Diabetes -- A1c is elevated but improving, dropped by 1.8. freestyle libre 3 sensor keep having consistently inaccurate readings that he double checks with a fingerstick and it is off by a lot. Lost 3 lbs since last office visit. BMP reviewed, normal except for elevated glucose.  Aortic atherosclerosis -- taking atorvastatin  Needs 2nd dose of shingles vaccine.    Current Medication: Outpatient Encounter Medications as of 04/02/2023  Medication Sig   Accu-Chek Softclix Lancets lancets Use 1 lancet to check glucose once daily and as needed for diabetes   acetaminophen (TYLENOL) 500 MG tablet Take 2 tablets (1,000 mg total) by mouth 4 (four) times daily.   albuterol (VENTOLIN HFA) 108 (90 Base) MCG/ACT inhaler INHALE 2 PUFFS INTO THE LUNGS EVERY 6 HOURS AS MEEDED FOR WHEEZING OR SHORTNESS OF BREATH   aspirin EC 81 MG tablet Take 81 mg by mouth daily at 3 pm.    atorvastatin (LIPITOR) 40 MG tablet Take 1 tablet by mouth once daily   Blood Glucose Monitoring Suppl (ACCU-CHEK GUIDE ME) w/Device KIT Use as directed. E11.65   BREZTRI AEROSPHERE 160-9-4.8 MCG/ACT AERO INHALE 2 PUFFS TWICE DAILY   buPROPion (WELLBUTRIN XL) 150 MG 24 hr tablet Take 1 tablet (150 mg total) by mouth daily. Take along with 300 mg daily   buPROPion (WELLBUTRIN XL) 300 MG 24 hr tablet Take 1 tablet (300 mg total) by mouth daily. Take along with 150 mg daily   Continuous Glucose Sensor (DEXCOM G7 SENSOR) MISC Use one every 10 days for uncontrolled diabetes E11.65   dapagliflozin propanediol  (FARXIGA) 10 MG TABS tablet Take 1 tablet (10 mg total) by mouth daily before breakfast.   DULoxetine (CYMBALTA) 30 MG capsule Take 1 capsule (30 mg total) by mouth daily. Take along with 60 mg daily , total of 90 mg daily   DULoxetine (CYMBALTA) 60 MG capsule Take 1 capsule (60 mg total) by mouth daily. Take along with 30 mg daily   ezetimibe (ZETIA) 10 MG tablet Take 1 tablet (10 mg total) by mouth daily.   famotidine (PEPCID) 20 MG tablet Take 1 tablet by mouth twice daily   glucose blood (ACCU-CHEK GUIDE) test strip Use 1 test strip to check fasting glucose once daily and as need for diabetes   insulin glargine, 2 Unit Dial, (TOUJEO MAX SOLOSTAR) 300 UNIT/ML Solostar Pen Inject 70 Units into the skin daily.   insulin lispro (HUMALOG) 200 UNIT/ML KwikPen Inject insulin into the skin with meals 3 times daily per sliding scale provided to patient. Max 24 hr dose: 42 units   Insulin Pen Needle 32G X 4 MM MISC Use with daily dose insulin and with sliding scale insulin as needed.   lidocaine (LIDODERM) 5 % Place 1 patch onto the skin daily. Remove & Discard patch within 12 hours or as directed by MD   Melatonin 10 MG CAPS Take 30 mg by mouth at bedtime.   metoprolol tartrate (LOPRESSOR) 100 MG tablet Take 1 tablet (100 mg total) by mouth 2 (two) times daily.   nitroGLYCERIN (NITROSTAT)  0.4 MG SL tablet Place 1 tablet (0.4 mg total) under the tongue every 5 (five) minutes x 3 doses as needed for chest pain.   pantoprazole (PROTONIX) 40 MG tablet Take 1 tablet by mouth once daily   Semaglutide, 1 MG/DOSE, 4 MG/3ML SOPN Inject 1 mg into the skin once a week.   tamsulosin (FLOMAX) 0.4 MG CAPS capsule Take 1 capsule by mouth once daily   temazepam (RESTORIL) 30 MG capsule TAKE 1 CAPSULE BY MOUTH AT BEDTIME AS NEEDED FOR SLEEP   traMADol (ULTRAM) 50 MG tablet Take one tab po bid for pain right rib cage   varenicline (CHANTIX) 1 MG tablet Take 1 tablet (1 mg total) by mouth 2 (two) times daily.    [DISCONTINUED] Continuous Blood Gluc Sensor (FREESTYLE LIBRE 3 SENSOR) MISC Apply 1 sensor to the skin every 14 days   [DISCONTINUED] Zoster Vaccine Adjuvanted [facility]) injection Inject 0.5 mLs into the muscle once.   gabapentin (NEURONTIN) 300 MG capsule Take 1 capsule (300 mg total) by mouth 3 (three) times daily.   Zoster Vaccine Adjuvanted [facility]) injection Inject 0.5 mLs into the muscle once for 1 dose.   No facility-administered encounter medications on file as of 04/02/2023.    Surgical History: Past Surgical History:  Procedure Laterality Date    ingrown toenail removal     CHOLECYSTECTOMY N/A 01/23/2019   Procedure: LAPAROSCOPIC CHOLECYSTECTOMY;  Surgeon: Henrene Dodge, MD;  Location: [facility] ORS;  Service: General;  Laterality: N/A;   CLAVICLE SURGERY Right    COLONOSCOPY  2017   COLONOSCOPY WITH PROPOFOL N/A 08/25/2020   Procedure: COLONOSCOPY WITH PROPOFOL;  Surgeon: Pasty Spillers, MD;  Location: [facility] ENDOSCOPY;  Service: Endoscopy;  Laterality: N/A;   ESOPHAGOGASTRODUODENOSCOPY (EGD) WITH PROPOFOL N/A 08/25/2020   Procedure: ESOPHAGOGASTRODUODENOSCOPY (EGD) WITH PROPOFOL;  Surgeon: Pasty Spillers, MD;  Location: [facility] ENDOSCOPY;  Service: Endoscopy;  Laterality: N/A;   IR THORACENTESIS ASP PLEURAL SPACE W/IMG GUIDE  11/20/2022   LYMPH NODE DISSECTION Right 10/27/2022   Procedure: LYMPH NODE DISSECTION;  Surgeon: Loreli Slot, MD;  Location: [facility] OR;  Service: Thoracic;  Laterality: Right;   MOUTH SURGERY     REMOVED ALL TEETH   SHOULDER SURGERY Right    UPPER GI ENDOSCOPY  2017   XI ROBOTIC ASSISTED THORACOSCOPY- SEGMENTECTOMY Right 10/27/2022   Procedure: XI ROBOTIC ASSISTED THORACOSCOPY-RIGHT LOWER LOBE SUPERIOR SEGMENTECTOMY;  Surgeon: Loreli Slot, MD;  Location: [facility] OR;  Service: Thoracic;  Laterality: Right;    Medical History: Past Medical History:  Diagnosis Date   Anxiety    Arthritis    NECK   Bronchitis    Coronary artery disease     Depression    Diabetes mellitus without complication (HCC)    Diastasis of rectus abdominis 06/19/2018   Dyspnea    DUE TO GALLBLADDER PER PT   Dysrhythmia    "fast hear rate at times. stopped when placed on metoprolol"   Emphysema lung (HCC)    Family history of adverse reaction to anesthesia    N/V, TROUBLE BREATHING COMING OUT OF ANESTHESIA   Fatty liver    GERD (gastroesophageal reflux disease)    Headache    MIGRAINES   Hyperlipidemia    Hypertension    Irregular heart beat unk   Myocardial infarction (HCC) 2010   MILD    PTSD (post-traumatic stress disorder)    Sleep apnea    USES CPAP   Tachycardia    Ventral hernia  Family History: Family History  Problem Relation Age of Onset   Hypertension Mother    Diabetes type II Mother    Diabetes Mother    COPD Father    Lung cancer Father    Heart disease Father    Diabetes Sister    Anxiety disorder Sister    Depression Sister    Diabetes Sister    Anxiety disorder Sister    Depression Sister    Diabetes Sister    Anxiety disorder Sister    Depression Sister    Diabetes Sister    Anxiety disorder Sister    Depression Sister    Diabetes Brother    Anxiety disorder Brother    Depression Brother    Diabetes Maternal Aunt    Colon cancer Maternal Uncle     Social History   Socioeconomic History   Marital status: Divorced    Spouse name: Not on file   Number of children: 4   Years of education: Not on file   Highest education level: 8th grade  Occupational History   Not on file  Tobacco Use   Smoking status: Every Day    Current packs/day: 0.25    Average packs/day: 0.3 packs/day for 40.0 years (10.0 ttl pk-yrs)    Types: Cigarettes   Smokeless tobacco: Former    Types: Chew   Tobacco comments:    2-3 cigarettes per day   Vaping Use   Vaping status: Former  Substance and Sexual Activity   Alcohol use: No    Alcohol/week: 0.0 standard drinks of alcohol   Drug use: No   Sexual activity: Not  Currently  Other Topics Concern   Not on file  Social History Narrative   Not on file   Social Determinants of Health   Financial Resource Strain: Low Risk  (08/17/2020)   Overall Financial Resource Strain (CARDIA)    Difficulty of Paying Living Expenses: Not very hard  Food Insecurity: Food Insecurity Present (12/14/2022)   Hunger Vital Sign    Worried About Running Out of Food in the Last Year: Often true    Ran Out of Food in the Last Year: Often true  Transportation Needs: No Transportation Needs (12/14/2022)   PRAPARE - Administrator, Civil Service (Medical): No    Lack of Transportation (Non-Medical): No  Physical Activity: Inactive (05/23/2018)   Exercise Vital Sign    Days of Exercise per Week: 0 days    Minutes of Exercise per Session: 0 min  Stress: Stress Concern Present (05/23/2018)   [employer] of Occupational Health - Occupational Stress Questionnaire    Feeling of Stress : Very much  Social Connections: Unknown (05/23/2018)   Social Connection and Isolation Panel [NHANES]    Frequency of Communication with Friends and Family: Not on file    Frequency of Social Gatherings with Friends and Family: Not on file    Attends Religious Services: Never    Active Member of Clubs or Organizations: No    Attends Banker Meetings: Never    Marital Status: Divorced  Catering manager Violence: Not At Risk (12/14/2022)   Humiliation, Afraid, Rape, and Kick questionnaire    Fear of Current or Ex-Partner: No    Emotionally Abused: No    Physically Abused: No    Sexually Abused: No      Review of Systems  Constitutional:  Negative for chills, fatigue and unexpected weight change.  HENT:  Negative for congestion, rhinorrhea, sneezing and  sore throat.   Eyes:  Negative for redness.  Respiratory: Negative.  Negative for cough, chest tightness, shortness of breath and wheezing.   Cardiovascular: Negative.  Negative for chest pain and palpitations.   Gastrointestinal:  Negative for abdominal pain, constipation, diarrhea, nausea and vomiting.  Genitourinary:  Negative for dysuria, flank pain and frequency.  Musculoskeletal:  Negative for arthralgias, back pain, joint swelling and neck pain.  Skin:  Positive for color change (slight jaundice). Negative for rash.  Neurological: Negative.  Negative for tremors and numbness.  Hematological:  Negative for adenopathy. Does not bruise/bleed easily.  Psychiatric/Behavioral:  Negative for behavioral problems (Depression), self-injury, sleep disturbance and suicidal ideas. The patient is not nervous/anxious.     Vital Signs: BP 130/84   Pulse 95   Temp 98.3 F (36.8 C)   Resp 16   Ht 5\' 9"  (1.753 m)   Wt 217 lb 3.2 oz (98.5 kg)   SpO2 96%   BMI 32.07 kg/m    Physical Exam Vitals reviewed.  Constitutional:      General: He is not in acute distress.    Appearance: Normal appearance. He is obese. He is not ill-appearing.  HENT:     Head: Normocephalic and atraumatic.  Eyes:     Pupils: Pupils are equal, round, and reactive to light.  Cardiovascular:     Rate and Rhythm: Normal rate and regular rhythm.  Pulmonary:     Effort: Pulmonary effort is normal. No respiratory distress.  Neurological:     Mental Status: He is alert and oriented to person, place, and time.  Psychiatric:        Mood and Affect: Mood normal.        Behavior: Behavior normal.        Assessment/Plan: 1. Type 2 diabetes mellitus with other specified complication, with long-term current use of insulin (HCC) A1c improving, continue toujeo, farxiga, humalog and ozempic as prescribed. Switch to Dexcom G7 instead of freestyle libre sensor. Samples given - Continuous Glucose Sensor (DEXCOM G7 SENSOR) MISC; Use one every 10 days for uncontrolled diabetes E11.65  Dispense: 3 each; Refill: 11  2. Aortic atherosclerosis (HCC) Continue atorvastatin as prescribed.  - atorvastatin (LIPITOR) 40 MG tablet; Take 1  tablet (40 mg total) by mouth daily.  Dispense: 90 tablet; Refill: 3  3. Need for vaccination - Zoster Vaccine Adjuvanted [facility]) injection; Inject 0.5 mLs into the muscle once for 1 dose.  Dispense: 0.5 mL; Refill: 0    General Counseling: Raymond [name] verbalizes understanding of the findings of todays visit and agrees with plan of treatment. I have discussed any further diagnostic evaluation that may be needed or ordered today. We also reviewed his medications today. he has been encouraged to call the office with any questions or concerns that should arise related to todays visit.    No orders of the defined types were placed in this encounter.   Meds ordered this encounter  Medications   Zoster Vaccine Adjuvanted [facility]) injection    Sig: Inject 0.5 mLs into the muscle once for 1 dose.    Dispense:  0.5 mL    Refill:  0   Continuous Glucose Sensor (DEXCOM G7 SENSOR) MISC    Sig: Use one every 10 days for uncontrolled diabetes E11.65    Dispense:  3 each    Refill:  11    Dx code E11.65, need to switch to dexcom, the freestyle Josephine Igo is not being consistently accurate.    Return in about  1 month (around 05/03/2023) for F/U, Raymond [name] PCP dexcom.   Total time spent:30 Minutes Time spent includes review of chart, medications, test results, and follow up plan with the patient.   [place] Controlled Substance Database was reviewed by me.  This patient was seen by Sallyanne Kuster, FNP-C in collaboration with Dr. Beverely Risen as a part of collaborative care agreement.   Raymond [name] R. Tedd Sias, MSN, FNP-C Internal medicine

## 2023-04-10 ENCOUNTER — Other Ambulatory Visit: Payer: Self-pay | Admitting: Nurse Practitioner

## 2023-04-10 DIAGNOSIS — K219 Gastro-esophageal reflux disease without esophagitis: Secondary | ICD-10-CM

## 2023-04-12 ENCOUNTER — Other Ambulatory Visit: Payer: Self-pay | Admitting: Nurse Practitioner

## 2023-04-12 DIAGNOSIS — Z79899 Other long term (current) drug therapy: Secondary | ICD-10-CM

## 2023-04-19 ENCOUNTER — Other Ambulatory Visit: Payer: Self-pay | Admitting: Nurse Practitioner

## 2023-04-19 DIAGNOSIS — Z79899 Other long term (current) drug therapy: Secondary | ICD-10-CM

## 2023-04-30 ENCOUNTER — Other Ambulatory Visit: Payer: Self-pay | Admitting: Nurse Practitioner

## 2023-04-30 ENCOUNTER — Ambulatory Visit
Admission: RE | Admit: 2023-04-30 | Discharge: 2023-04-30 | Disposition: A | Discharge status: Home / Self Care | Payer: 59 | Source: Ambulatory Visit | Attending: Internal Medicine | Admitting: Internal Medicine

## 2023-04-30 DIAGNOSIS — C3431 Malignant neoplasm of lower lobe, right bronchus or lung: Secondary | ICD-10-CM | POA: Diagnosis not present

## 2023-04-30 DIAGNOSIS — R0781 Pleurodynia: Secondary | ICD-10-CM

## 2023-04-30 DIAGNOSIS — C3491 Malignant neoplasm of unspecified part of right bronchus or lung: Secondary | ICD-10-CM | POA: Insufficient documentation

## 2023-04-30 DIAGNOSIS — I700 Atherosclerosis of aorta: Secondary | ICD-10-CM | POA: Diagnosis not present

## 2023-04-30 MED ORDER — IOHEXOL 300 MG/ML  SOLN
75.0000 mL | Freq: Once | INTRAMUSCULAR | Status: AC | PRN
  Administered 2023-04-30: 75 mL via INTRAVENOUS

## 2023-04-30 MED ORDER — TRAMADOL HCL 50 MG PO TABS
ORAL_TABLET | ORAL | 1 refills | Status: DC
Start: 2023-04-30 — End: 2023-07-17

## 2023-05-07 ENCOUNTER — Ambulatory Visit: Payer: 59 | Admitting: Nurse Practitioner

## 2023-05-07 ENCOUNTER — Encounter: Payer: Self-pay | Admitting: Nurse Practitioner

## 2023-05-07 VITALS — BP 110/76 | HR 88 | Temp 98.4°F | Resp 16 | Ht 69.0 in | Wt 217.0 lb

## 2023-05-07 DIAGNOSIS — I700 Atherosclerosis of aorta: Secondary | ICD-10-CM

## 2023-05-07 DIAGNOSIS — I152 Hypertension secondary to endocrine disorders: Secondary | ICD-10-CM

## 2023-05-07 DIAGNOSIS — Z794 Long term (current) use of insulin: Secondary | ICD-10-CM

## 2023-05-07 DIAGNOSIS — E1159 Type 2 diabetes mellitus with other circulatory complications: Secondary | ICD-10-CM | POA: Diagnosis not present

## 2023-05-07 DIAGNOSIS — E1169 Type 2 diabetes mellitus with other specified complication: Secondary | ICD-10-CM | POA: Diagnosis not present

## 2023-05-07 NOTE — Progress Notes (Signed)
 [facility] [address] [place], [place] [address]  Internal MEDICINE  Office Visit Note  Patient Name: Raymond [name]  [id number]  [id number]  Date of Service: 05/07/2023  Chief Complaint  Patient presents with   Depression   Gastroesophageal Reflux   Hyperlipidemia   Hypertension   Diabetes   Follow-up    HPI Raymond [name] presents for a follow-up visit for diabetes and CGM review Diabetes -- patient has been using CGM for about 1 month. Insurance is covering the Dexcom sensors for the patient. Patient's dexcom has been linked with provider through dexcom clarity. Current glucose, and trends over the past month reviewed with patient today. Approximate estimated A1c is 6.8. patient reports that this is easier to use than his previous CGM that he was using and stays on his skin better as well.  Hypertension -- blood pressure is controlled with current medications.  Aortic atherosclerosis -- takes atorvastatin , no issues.     Current Medication: Outpatient Encounter Medications as of 05/07/2023  Medication Sig   Accu-Chek Softclix Lancets lancets Use 1 lancet to check glucose once daily and as needed for diabetes   acetaminophen  (TYLENOL ) 500 MG tablet Take 2 tablets (1,000 mg total) by mouth 4 (four) times daily.   albuterol  (VENTOLIN  HFA) 108 (90 Base) MCG/ACT inhaler INHALE 2 PUFFS INTO THE LUNGS EVERY 6 HOURS AS MEEDED FOR WHEEZING OR SHORTNESS OF BREATH   aspirin  EC 81 MG tablet Take 81 mg by mouth daily at 3 pm.    atorvastatin  (LIPITOR) 40 MG tablet Take 1 tablet (40 mg total) by mouth daily.   Blood Glucose Monitoring Suppl (ACCU-CHEK GUIDE ME) w/Device KIT Use as directed. E11.65   BREZTRI  AEROSPHERE 160-9-4.8 MCG/ACT AERO INHALE 2 PUFFS TWICE DAILY   buPROPion  (WELLBUTRIN  XL) 150 MG 24 hr tablet Take 1 tablet (150 mg total) by mouth daily. Take along with 300 mg daily   buPROPion  (WELLBUTRIN  XL) 300 MG 24 hr tablet Take 1 tablet (300 mg total) by mouth daily.  Take along with 150 mg daily   Continuous Glucose Sensor (DEXCOM G7 SENSOR) MISC Use one every 10 days for uncontrolled diabetes E11.65   dapagliflozin  propanediol (FARXIGA ) 10 MG TABS tablet Take 1 tablet (10 mg total) by mouth daily before breakfast.   DULoxetine  (CYMBALTA ) 30 MG capsule Take 1 capsule (30 mg total) by mouth daily. Take along with 60 mg daily , total of 90 mg daily   DULoxetine  (CYMBALTA ) 60 MG capsule Take 1 capsule (60 mg total) by mouth daily. Take along with 30 mg daily   ezetimibe  (ZETIA ) 10 MG tablet Take 1 tablet (10 mg total) by mouth daily.   famotidine  (PEPCID ) 20 MG tablet Take 1 tablet by mouth twice daily   glucose blood (ACCU-CHEK GUIDE) test strip Use 1 test strip to check fasting glucose once daily and as need for diabetes   insulin  glargine, 2 Unit Dial , (TOUJEO  MAX SOLOSTAR) 300 UNIT/ML Solostar Pen Inject 70 Units into the skin daily.   insulin  lispro (HUMALOG ) 200 UNIT/ML KwikPen Inject insulin  into the skin with meals 3 times daily per sliding scale provided to patient. Max 24 hr dose: 42 units   Insulin  Pen Needle 32G X 4 MM MISC Use with daily dose insulin  and with sliding scale insulin  as needed.   lidocaine  (LIDODERM ) 5 % Place 1 patch onto the skin daily. Remove & Discard patch within 12 hours or as directed by MD   Melatonin 10 MG CAPS Take 30  mg by mouth at bedtime.   metoprolol  tartrate (LOPRESSOR ) 100 MG tablet Take 1 tablet by mouth twice daily   nitroGLYCERIN  (NITROSTAT ) 0.4 MG SL tablet Place 1 tablet (0.4 mg total) under the tongue every 5 (five) minutes x 3 doses as needed for chest pain.   pantoprazole  (PROTONIX ) 40 MG tablet Take 1 tablet by mouth once daily   Semaglutide , 1 MG/DOSE, 4 MG/3ML SOPN Inject 1 mg into the skin once a week.   tamsulosin  (FLOMAX ) 0.4 MG CAPS capsule Take 1 capsule by mouth once daily   temazepam  (RESTORIL ) 30 MG capsule TAKE 1 CAPSULE BY MOUTH AT BEDTIME AS NEEDED FOR SLEEP   traMADol  (ULTRAM ) 50 MG tablet Take one  tab po bid as needed for pain of right rib cage   varenicline  (CHANTIX ) 1 MG tablet Take 1 tablet (1 mg total) by mouth 2 (two) times daily.   gabapentin  (NEURONTIN ) 300 MG capsule Take 1 capsule (300 mg total) by mouth 3 (three) times daily.   No facility-administered encounter medications on file as of 05/07/2023.    Surgical History: Past Surgical History:  Procedure Laterality Date    ingrown toenail removal     CHOLECYSTECTOMY N/A 01/23/2019   Procedure: LAPAROSCOPIC CHOLECYSTECTOMY;  Surgeon: Desiderio Schanz, MD;  Location: [facility] ORS;  Service: General;  Laterality: N/A;   CLAVICLE SURGERY Right    COLONOSCOPY  2017   COLONOSCOPY WITH PROPOFOL  N/A 08/25/2020   Procedure: COLONOSCOPY WITH PROPOFOL ;  Surgeon: Janalyn [name] NOVAK, MD;  Location: [facility] END[facility];  Service: Endoscopy;  Laterality: N/A;   ESOPHAGOGASTRODUODENOSCOPY (EGD) WITH PROPOFOL  N/A 08/25/2020   Procedure: ESOPHAGOGASTRODUODENOSCOPY (EGD) WITH PROPOFOL ;  Surgeon: Janalyn [name] NOVAK, MD;  Location: [facility] END[facility]COPY;  Service: Endoscopy;  Laterality: N/A;   IR THORACENTESIS ASP PLEURAL SPACE W/IMG GUIDE  11/20/2022   LYMPH NODE DISSECTION Right 10/27/2022   Procedure: LYMPH NODE DISSECTION;  Surgeon: Kerrin Elspeth BROCKS, MD;  Location: [facility] OR;  Service: Thoracic;  Laterality: Right;   MOUTH SURGERY     REMOVED ALL TEETH   SHOULDER SURGERY Right    UPPER GI ENDOSCOPY  2017   XI ROBOTIC ASSISTED THORACOSCOPY- SEGMENTECTOMY Right 10/27/2022   Procedure: XI ROBOTIC ASSISTED THORACOSCOPY-RIGHT LOWER LOBE SUPERIOR SEGMENTECTOMY;  Surgeon: Kerrin Elspeth BROCKS, MD;  Location: [facility] OR;  Service: Thoracic;  Laterality: Right;    Medical History: Past Medical History:  Diagnosis Date   Anxiety    Arthritis    NECK   Bronchitis    Coronary artery disease    Depression    Diabetes mellitus without complication (HCC)    Diastasis of rectus abdominis 06/19/2018   Dyspnea    DUE TO GALLBLADDER PER PT   Dysrhythmia    fast hear  rate at times. stopped when placed on metoprolol    Emphysema lung (HCC)    Family history of adverse reaction to anesthesia    N/V, TROUBLE BREATHING COMING OUT OF ANESTHESIA   Fatty liver    GERD (gastroesophageal reflux disease)    Headache    MIGRAINES   Hyperlipidemia    Hypertension    Irregular heart beat unk   Myocardial infarction (HCC) 2010   MILD    PTSD (post-traumatic stress disorder)    Sleep apnea    USES CPAP   Tachycardia    Ventral hernia     Family History: Family History  Problem Relation Age of Onset   Hypertension Mother    Diabetes type II Mother    Diabetes Mother  COPD Father    Lung cancer Father    Heart disease Father    Diabetes Sister    Anxiety disorder Sister    Depression Sister    Diabetes Sister    Anxiety disorder Sister    Depression Sister    Diabetes Sister    Anxiety disorder Sister    Depression Sister    Diabetes Sister    Anxiety disorder Sister    Depression Sister    Diabetes Brother    Anxiety disorder Brother    Depression Brother    Diabetes Maternal Aunt    Colon cancer Maternal Uncle     Social History   Socioeconomic History   Marital status: Divorced    Spouse name: Not on file   Number of children: 4   Years of education: Not on file   Highest education level: 8th grade  Occupational History   Not on file  Tobacco Use   Smoking status: Every Day    Current packs/day: 0.25    Average packs/day: 0.3 packs/day for 40.0 years (10.0 ttl pk-yrs)    Types: Cigarettes   Smokeless tobacco: Former    Types: Chew   Tobacco comments:    2-3 cigarettes per day   Vaping Use   Vaping status: Former  Substance and Sexual Activity   Alcohol use: No    Alcohol/week: 0.0 standard drinks of alcohol   Drug use: No   Sexual activity: Not Currently  Other Topics Concern   Not on file  Social History Narrative   Not on file   Social Drivers of Health   Financial Resource Strain: Low Risk  (08/17/2020)    Overall Financial Resource Strain (CARDIA)    Difficulty of Paying Living Expenses: Not very hard  Food Insecurity: Food Insecurity Present (12/14/2022)   Hunger Vital Sign    Worried About Running Out of Food in the Last Year: Often true    Ran Out of Food in the Last Year: Often true  Transportation Needs: No Transportation Needs (12/14/2022)   PRAPARE - Administrator, Civil Service (Medical): No    Lack of Transportation (Non-Medical): No  Physical Activity: Inactive (05/23/2018)   Exercise Vital Sign    Days of Exercise per Week: 0 days    Minutes of Exercise per Session: 0 min  Stress: Stress Concern Present (05/23/2018)   [employer] of Occupational Health - Occupational Stress Questionnaire    Feeling of Stress : Very much  Social Connections: Unknown (05/23/2018)   Social Connection and Isolation Panel [NHANES]    Frequency of Communication with Friends and Family: Not on file    Frequency of Social Gatherings with Friends and Family: Not on file    Attends Religious Services: Never    Active Member of Clubs or Organizations: No    Attends Banker Meetings: Never    Marital Status: Divorced  Catering Manager Violence: Not At Risk (12/14/2022)   Humiliation, Afraid, Rape, and Kick questionnaire    Fear of Current or Ex-Partner: No    Emotionally Abused: No    Physically Abused: No    Sexually Abused: No      Review of Systems  Constitutional:  Negative for chills, fatigue and unexpected weight change.  HENT:  Negative for congestion, rhinorrhea, sneezing and sore throat.   Eyes:  Negative for redness.  Respiratory: Negative.  Negative for cough, chest tightness, shortness of breath and wheezing.   Cardiovascular: Negative.  Negative  for chest pain and palpitations.  Gastrointestinal:  Negative for abdominal pain, constipation, diarrhea, nausea and vomiting.  Genitourinary:  Negative for dysuria, flank pain and frequency.  Musculoskeletal:   Negative for arthralgias, back pain, joint swelling and neck pain.  Skin:  Negative for color change and rash.  Neurological: Negative.  Negative for tremors and numbness.  Hematological:  Negative for adenopathy. Does not bruise/bleed easily.  Psychiatric/Behavioral:  Negative for behavioral problems (Depression), self-injury, sleep disturbance and suicidal ideas. The patient is not nervous/anxious.     Vital Signs: BP 110/76   Pulse 88   Temp 98.4 F (36.9 C)   Resp 16   Ht 5' 9 (1.753 m)   Wt 217 lb (98.4 kg)   SpO2 98%   BMI 32.05 kg/m    Physical Exam Vitals reviewed.  Constitutional:      General: He is not in acute distress.    Appearance: Normal appearance. He is obese. He is not ill-appearing.  HENT:     Head: Normocephalic and atraumatic.  Eyes:     Pupils: Pupils are equal, round, and reactive to light.  Cardiovascular:     Rate and Rhythm: Normal rate and regular rhythm.  Pulmonary:     Effort: Pulmonary effort is normal. No respiratory distress.  Neurological:     Mental Status: He is alert and oriented to person, place, and time.  Psychiatric:        Mood and Affect: Mood normal.        Behavior: Behavior normal.       Assessment/Plan: 1. Type 2 diabetes mellitus with other specified complication, with long-term current use of insulin  (HCC) (Primary) Continue dexcom use, continue ozempic , toujeo  and farxiga  as prescribed.   2. Hypertension associated with diabetes (HCC) Continue metoprolol  as prescribed.   3. Aortic atherosclerosis (HCC) Continue atorvastatin  and ezetimibe  as prescribed.    General Counseling: Garlan verbalizes understanding of the findings of todays visit and agrees with plan of treatment. I have discussed any further diagnostic evaluation that may be needed or ordered today. We also reviewed his medications today. he has been encouraged to call the office with any questions or concerns that should arise related to todays  visit.    No orders of the defined types were placed in this encounter.   No orders of the defined types were placed in this encounter.   Return in about 3 months (around 08/05/2023) for F/U, Recheck A1C, Melannie Metzner PCP.   Total time spent:30 Minutes Time spent includes review of chart, medications, test results, and follow up plan with the patient.   [place] Controlled Substance Database was reviewed by me.  This patient was seen by Mardy Maxin, FNP-C in collaboration with Dr. Sigrid Bathe as a part of collaborative care agreement.   Farmer Mccahill R. Maxin, MSN, FNP-C Internal medicine

## 2023-05-14 ENCOUNTER — Other Ambulatory Visit: Payer: Self-pay

## 2023-05-14 DIAGNOSIS — C3491 Malignant neoplasm of unspecified part of right bronchus or lung: Secondary | ICD-10-CM

## 2023-05-15 ENCOUNTER — Encounter: Payer: Self-pay | Admitting: Internal Medicine

## 2023-05-15 ENCOUNTER — Inpatient Hospital Stay (HOSPITAL_BASED_OUTPATIENT_CLINIC_OR_DEPARTMENT_OTHER): Payer: 59 | Admitting: Internal Medicine

## 2023-05-15 ENCOUNTER — Inpatient Hospital Stay: Payer: 59 | Attending: Internal Medicine

## 2023-05-15 VITALS — BP 142/82 | HR 88 | Temp 98.5°F | Resp 18 | Wt 215.0 lb

## 2023-05-15 DIAGNOSIS — C3491 Malignant neoplasm of unspecified part of right bronchus or lung: Secondary | ICD-10-CM

## 2023-05-15 DIAGNOSIS — F17210 Nicotine dependence, cigarettes, uncomplicated: Secondary | ICD-10-CM | POA: Diagnosis not present

## 2023-05-15 DIAGNOSIS — C3431 Malignant neoplasm of lower lobe, right bronchus or lung: Secondary | ICD-10-CM | POA: Insufficient documentation

## 2023-05-15 DIAGNOSIS — Z79899 Other long term (current) drug therapy: Secondary | ICD-10-CM | POA: Diagnosis not present

## 2023-05-15 LAB — CMP (CANCER CENTER ONLY)
ALT: 18 U/L (ref 0–44)
AST: 18 U/L (ref 15–41)
Albumin: 4.1 g/dL (ref 3.5–5.0)
Alkaline Phosphatase: 104 U/L (ref 38–126)
Anion gap: 10 (ref 5–15)
BUN: 12 mg/dL (ref 6–20)
CO2: 23 mmol/L (ref 22–32)
Calcium: 9.1 mg/dL (ref 8.9–10.3)
Chloride: 101 mmol/L (ref 98–111)
Creatinine: 0.95 mg/dL (ref 0.61–1.24)
GFR, Estimated: >60 mL/min (ref >60)
Glucose, Bld: 153 mg/dL — ABNORMAL HIGH (ref 70–99)
Potassium: 4.3 mmol/L (ref 3.5–5.1)
Sodium: 134 mmol/L — ABNORMAL LOW (ref 135–145)
Total Bilirubin: 0.5 mg/dL (ref 0.0–1.2)
Total Protein: 7.7 g/dL (ref 6.5–8.1)

## 2023-05-15 LAB — CBC WITH DIFFERENTIAL (CANCER CENTER ONLY)
Abs Immature Granulocytes: 0.02 10*3/uL (ref 0.00–0.07)
Basophils Absolute: 0.1 10*3/uL (ref 0.0–0.1)
Basophils Relative: 1 %
Eosinophils Absolute: 0.1 10*3/uL (ref 0.0–0.5)
Eosinophils Relative: 1 %
HCT: 42.3 % (ref 39.0–52.0)
Hemoglobin: 14.7 g/dL (ref 13.0–17.0)
Immature Granulocytes: 0 %
Lymphocytes Relative: 34 %
Lymphs Abs: 2.5 10*3/uL (ref 0.7–4.0)
MCH: 30.6 pg (ref 26.0–34.0)
MCHC: 34.8 g/dL (ref 30.0–36.0)
MCV: 87.9 fL (ref 80.0–100.0)
Monocytes Absolute: 0.5 10*3/uL (ref 0.1–1.0)
Monocytes Relative: 8 %
Neutro Abs: 4 10*3/uL (ref 1.7–7.7)
Neutrophils Relative %: 56 %
Platelet Count: 198 10*3/uL (ref 150–400)
RBC: 4.81 MIL/uL (ref 4.22–5.81)
RDW: 12.7 % (ref 11.5–15.5)
WBC Count: 7.2 10*3/uL (ref 4.0–10.5)
nRBC: 0 % (ref 0.0–0.2)

## 2023-05-15 NOTE — Progress Notes (Signed)
Patient is still having pain on his right side where they removed some of his lung, he rates his pain today at 10. He had a CT on 04/30/2023.

## 2023-05-15 NOTE — Progress Notes (Signed)
[facility] CONSULT NOTE  Patient Care Team: Sallyanne Kuster, NP as PCP - General (Nurse Practitioner) Lyndon Code, MD (Internal Medicine) Monika Salk, [facility] (Inactive) as Pharmacist (Pharmacist) Michaelyn Barter, MD as Consulting Physician (Oncology) Glory Buff, RN as Oncology Nurse Navigator  REFERRING PROVIDER: Dr. Dorris Fetch  REASON FOR REFFERAL: Stage Ia lung cancer, surveillance  CANCER STAGING   Cancer Staging  Primary adenocarcinoma of right lung [facility]) Staging form: Lung, AJCC 8th Edition - Clinical stage from 10/27/2022: Stage IA2 (cT1b, cN0, cM0) - Signed by Michaelyn Barter, MD on 12/15/2022   ASSESSMENT & PLAN:  Raymond [name] 58 y.o. male with pmh of smoking,Diabetes, depression, anxiety, OSA, hypertension, hyperlipidemia referred to medical oncology for surveillance of stage Ia right lung adenocarcinoma detected on low-dose CT lung screening.  # Right lung adenocarcinoma, stage IA -Detected on low-dose CT lung screening.  PET scan from 09/20/2022 showed 1.5 cm nodule in the superior segment of right lower lobe.  - s/p Robotic assisted VATS with right lower lobe superior segmentectomy, node dissection and intercostal nerve block with Dr. Dorris Fetch on 10/27/2022.  Pathology report showed 1.2 x 1 x 1 cm invasive nonmucinous adenocarcinoma with lipidic component, margins negative, no LVI, 0/15 lymph nodes negative.  pT1b, N0  -No role for adjuvant chemotherapy.  Will do surveillance based on NCCN guidelines.   -CT chest with contrast done on 04/30/2023 showed no signs of locally recurrent disease or metastatic disease.  Stable 4 mm solid nodule in the left lower lobe. -Schedule for repeat CT chest in 6 months.  -Patient continues to smoke. But has cut down. He is on Chantix. Did not tolerate nicotine patch due to rash. He plans to buy nicotine gum.   #Localized right lower chest pain -ongoing since surgery in July 2024. Unchanged - on tramadol 50  mg BID. Tylenol PM. Mild improvement. Allergic to Vicodin. Prescribed by primary.  - will discuss further with PCP  Orders Placed This Encounter  Procedures   CT Chest W Contrast    Standing Status:   Future    Expected Date:   10/26/2023    Expiration Date:   05/14/2024    If indicated for the ordered procedure, I authorize the administration of contrast media per Radiology protocol:   Yes    Does the patient have a contrast media/X-ray dye allergy?:   No    Preferred imaging location?:   [facility]   CBC with Differential (Cancer Center Only)    Standing Status:   Future    Expected Date:   11/12/2023    Expiration Date:   05/14/2024   CMP (Cancer Center only)    Standing Status:   Future    Expected Date:   11/12/2023    Expiration Date:   05/14/2024   RTC in 6 months for MD visit to discuss CT scan.  The total time spent in the appointment was 30 minutes encounter with patients including review of chart and various tests results, discussions about plan of care and coordination of care plan   All questions were answered. The patient knows to call the clinic with any problems, questions or concerns. No barriers to learning was detected.  Michaelyn Barter, MD 1/21/20251:40 PM   HISTORY OF PRESENTING ILLNESS:  Raymond [name] 58 y.o. male with pmh of smoking,Diabetes, depression, anxiety, OSA, hypertension, hyperlipidemia referred to medical oncology for surveillance of stage Ia right lung adenocarcinoma detected on low-dose CT lung screening.  Interval history Patient  was seen today as follow-up for stage I right lung adenocarcinoma on surveillance. He reports localized severe pain in the right lower part of the chest at the incision site. Ongoing since July 2024 after he had surgery. Reports tigthness in back muscle on deep breath. Unchanged.   I have reviewed his chart and materials related to his cancer extensively and collaborated history with the patient. Summary of  oncologic history is as follows: Oncology History  Primary adenocarcinoma of right lung (HCC)  08/31/2022 Imaging   With history of smoking, patient underwent low-dose CT lung screening.  MPRESSION: 1. Lung-RADS 4B, suspicious. Additional imaging evaluation or consultation with Pulmonology or Thoracic Surgery recommended. New indistinct irregular solid superior segment right lower lobe pulmonary nodule measuring 12.5 mm in volume derived mean diameter, suspicious for primary bronchogenic malignancy. PET-CT suggested at this time for further characterization. 2. Two-vessel coronary atherosclerosis. 3. Aortic Atherosclerosis   09/20/2022 PET scan   MPRESSION: 15 mm bilobed nodule in the superior segment right lower lobe demonstrates mild hypermetabolism. While infectious/inflammatory etiologies are technically possible, this appearance is considered suspicious for primary bronchogenic carcinoma such as semi-invasive adenocarcinoma.   No evidence of metastatic disease.   10/27/2022 Cancer Staging   Staging form: Lung, AJCC 8th Edition - Clinical stage from 10/27/2022: Stage IA2 (cT1b, cN0, cM0) - Signed by Michaelyn Barter, MD on 12/15/2022   10/27/2022 Definitive Surgery   FINAL MICROSCOPIC DIAGNOSIS:  A. LYMPH NODE, LEVEL 11, EXCISION: One lymph node with rare atypical cells (see comment)  B. LYMPH NODE, LEVEL 12, EXCISION: One benign lymph node, negative for carcinoma (0/1)  C. RIGHT LUNG, LOWER LOBE, SUPERIOR SEGMENT, NODULE, SEGMENTECTOMY: Invasive well to moderately differentiated adenocarcinoma Tumor measures 1.2 x 1.0 x 1.0 cm (pT1b) Margins free  D. LYMPH NODE, LEVEL 9, EXCISION: One benign lymph node, negative for carcinoma (0/1)  E. LYMPH NODE, LEVEL 8, EXCISION: One benign lymph node, negative for carcinoma (0/1)  F. LYMPH NODE, LEVEL 8 #2, EXCISION: One benign lymph node, negative for carcinoma (0/1)  G. LYMPH NODE, LEVEL 7, EXCISION: One benign lymph node,  negative for carcinoma (0/1)  H. LYMPH NODE, LEVEL 7 #2, EXCISION: One benign lymph node, negative for carcinoma (0/1)  I. LYMPH NODE, LEVEL 7 #3, EXCISION: One benign lymph node, negative for carcinoma (0/1)  J. LYMPH NODE, LEVEL 11 #2, EXCISION: One benign lymph node, negative for carcinoma (0/1)  K. LYMPH NODE, LEVEL 12 #2, EXCISION: One benign lymph node, negative for carcinoma (0/1)  L. LYMPH NODE, LEVEL 12 #3, EXCISION: One benign lymph node, negative for carcinoma (0/1)  M. LYMPH NODE, LEVEL 12 #4, EXCISION: One benign lymph node, negative for carcinoma (0/1)  N. LYMPH NODE, LEVEL 13, EXCISION: Benign alveolar laded lung with scant lymphoid stroma Negative for carcinoma  O. LYMPH NODE, LEVEL 10, EXCISION: One benign lymph node, negative for carcinoma (0/1)  P. LYMPH NODE, LEVEL 12 #5, EXCISION: One benign lymph node, negative for carcinoma (0/1)  Q. LYMPH NODE, LEVEL 13 #2, EXCISION: One benign lymph node, negative for carcinoma (0/1)  ONCOLOGY TABLE:  LUNG: Resection  Synchronous Tumors: Not applicable Total Number of Primary Tumors: 1 Procedure: Segmentectomy Specimen Laterality: Right Tumor Focality: Unifocal Tumor Site: Lower lobe, superior segment Tumor Size:      Total Tumor Size: 1.2 x 1.0 x 1.0 cm      Invasive Tumor Size (applies only to invasive nonmucinous adenocarcinoma with a lepidic           component):  1.1 x 0.9 x 0.9 cm Histologic Type: Adenocarcinoma Visceral Pleura Invasion: Not identified Direct Invasion of Adjacent Structures: No adjacent structures present Lymphovascular Invasion: Not identified Margins: All margins negative for invasive carcinoma      Closest Margin(s) to Invasive Carcinoma: 3 cm from bronchial margin      Margin(s) Involved by Invasive Carcinoma: Not applicable       Margin Status for Non-Invasive Tumor: Not applicable Treatment Effect: No known presurgical therapy Regional Lymph Nodes:      Number of Lymph  Nodes Involved: Pending                           Nodal Sites with Tumor: Pending      Number of Lymph Nodes Examined: 15                      Nodal Sites Examined: Station 7, 8, 9, 10, 11, 12 and 13 Distant Metastasis: Not applicable Pathologic Stage Classification (pTNM, AJCC 8th Edition): pT1b, pN0 Ancillary Studies: Available upon request Representative Tumor Block: C2, C4, C5 Comment(s): On frozen section the initial station 11 lymph node shows rare atypical cells.  An immunohistochemical stain for pancytokeratin (AE1/AE3) performed with adequate control on the permanent sections of this station 11 lymph node (A) is negative for epithelial cells.        MEDICAL HISTORY:  Past Medical History:  Diagnosis Date   Anxiety    Arthritis    NECK   Bronchitis    Coronary artery disease    Depression    Diabetes mellitus without complication (HCC)    Diastasis of rectus abdominis 06/19/2018   Dyspnea    DUE TO GALLBLADDER PER PT   Dysrhythmia    "fast hear rate at times. stopped when placed on metoprolol"   Emphysema lung (HCC)    Family history of adverse reaction to anesthesia    N/V, TROUBLE BREATHING COMING OUT OF ANESTHESIA   Fatty liver    GERD (gastroesophageal reflux disease)    Headache    MIGRAINES   Hyperlipidemia    Hypertension    Irregular heart beat unk   Myocardial infarction (HCC) 2010   MILD    PTSD (post-traumatic stress disorder)    Sleep apnea    USES CPAP   Tachycardia    Ventral hernia     SURGICAL HISTORY: Past Surgical History:  Procedure Laterality Date    ingrown toenail removal     CHOLECYSTECTOMY N/A 01/23/2019   Procedure: LAPAROSCOPIC CHOLECYSTECTOMY;  Surgeon: Henrene Dodge, MD;  Location: [facility] ORS;  Service: General;  Laterality: N/A;   CLAVICLE SURGERY Right    COLONOSCOPY  2017   COLONOSCOPY WITH PROPOFOL N/A 08/25/2020   Procedure: COLONOSCOPY WITH PROPOFOL;  Surgeon: Pasty Spillers, MD;  Location: [facility] END[facility]COPY;   Service: Endoscopy;  Laterality: N/A;   ESOPHAGOGASTRODUODENOSCOPY (EGD) WITH PROPOFOL N/A 08/25/2020   Procedure: ESOPHAGOGASTRODUODENOSCOPY (EGD) WITH PROPOFOL;  Surgeon: Pasty Spillers, MD;  Location: [facility] ENDOSCOPY;  Service: Endoscopy;  Laterality: N/A;   IR THORACENTESIS ASP PLEURAL SPACE W/IMG GUIDE  11/20/2022   LYMPH NODE DISSECTION Right 10/27/2022   Procedure: LYMPH NODE DISSECTION;  Surgeon: Loreli Slot, MD;  Location: [facility] OR;  Service: Thoracic;  Laterality: Right;   MOUTH SURGERY     REMOVED ALL TEETH   SHOULDER SURGERY Right    UPPER GI ENDOSCOPY  2017   XI ROBOTIC ASSISTED THORACOSCOPY- SEGMENTECTOMY Right  10/27/2022   Procedure: XI ROBOTIC ASSISTED THORACOSCOPY-RIGHT LOWER LOBE SUPERIOR SEGMENTECTOMY;  Surgeon: Loreli Slot, MD;  Location: [facility] OR;  Service: Thoracic;  Laterality: Right;    SOCIAL HISTORY: Social History   Socioeconomic History   Marital status: Divorced    Spouse name: Not on file   Number of children: 4   Years of education: Not on file   Highest education level: 8th grade  Occupational History   Not on file  Tobacco Use   Smoking status: Every Day    Current packs/day: 0.25    Average packs/day: 0.3 packs/day for 40.0 years (10.0 ttl pk-yrs)    Types: Cigarettes   Smokeless tobacco: Former    Types: Chew   Tobacco comments:    2-3 cigarettes per day   Vaping Use   Vaping status: Former  Substance and Sexual Activity   Alcohol use: No    Alcohol/week: 0.0 standard drinks of alcohol   Drug use: No   Sexual activity: Not Currently  Other Topics Concern   Not on file  Social History Narrative   Not on file   Social Drivers of Health   Financial Resource Strain: Low Risk  (08/17/2020)   Overall Financial Resource Strain (CARDIA)    Difficulty of Paying Living Expenses: Not very hard  Food Insecurity: Food Insecurity Present (12/14/2022)   Hunger Vital Sign    Worried About Running Out of Food in the Last Year: Often  true    Ran Out of Food in the Last Year: Often true  Transportation Needs: No Transportation Needs (12/14/2022)   PRAPARE - Administrator, Civil Service (Medical): No    Lack of Transportation (Non-Medical): No  Physical Activity: Inactive (05/23/2018)   Exercise Vital Sign    Days of Exercise per Week: 0 days    Minutes of Exercise per Session: 0 min  Stress: Stress Concern Present (05/23/2018)   [employer] of Occupational Health - Occupational Stress Questionnaire    Feeling of Stress : Very much  Social Connections: Unknown (05/23/2018)   Social Connection and Isolation Panel [NHANES]    Frequency of Communication with Friends and Family: Not on file    Frequency of Social Gatherings with Friends and Family: Not on file    Attends Religious Services: Never    Active Member of Clubs or Organizations: No    Attends Banker Meetings: Never    Marital Status: Divorced  Catering manager Violence: Not At Risk (12/14/2022)   Humiliation, Afraid, Rape, and Kick questionnaire    Fear of Current or Ex-Partner: No    Emotionally Abused: No    Physically Abused: No    Sexually Abused: No    FAMILY HISTORY: Family History  Problem Relation Age of Onset   Hypertension Mother    Diabetes type II Mother    Diabetes Mother    COPD Father    Lung cancer Father    Heart disease Father    Diabetes Sister    Anxiety disorder Sister    Depression Sister    Diabetes Sister    Anxiety disorder Sister    Depression Sister    Diabetes Sister    Anxiety disorder Sister    Depression Sister    Diabetes Sister    Anxiety disorder Sister    Depression Sister    Diabetes Brother    Anxiety disorder Brother    Depression Brother    Diabetes Maternal Aunt  Colon cancer Maternal Uncle     ALLERGIES:  is allergic to onion, norco [hydrocodone-acetaminophen], nickel, and nicoderm [nicotine].  MEDICATIONS:  Current Outpatient Medications  Medication Sig  Dispense Refill   Accu-Chek Softclix Lancets lancets Use 1 lancet to check glucose once daily and as needed for diabetes 100 each 12   acetaminophen (TYLENOL) 500 MG tablet Take 2 tablets (1,000 mg total) by mouth 4 (four) times daily. 30 tablet 0   albuterol (VENTOLIN HFA) 108 (90 Base) MCG/ACT inhaler INHALE 2 PUFFS INTO THE LUNGS EVERY 6 HOURS AS MEEDED FOR WHEEZING OR SHORTNESS OF BREATH 18 g 1   aspirin EC 81 MG tablet Take 81 mg by mouth daily at 3 pm.      atorvastatin (LIPITOR) 40 MG tablet Take 1 tablet (40 mg total) by mouth daily. 90 tablet 3   Blood Glucose Monitoring Suppl (ACCU-CHEK GUIDE ME) w/Device KIT Use as directed. E11.65 1 kit 0   BREZTRI AEROSPHERE 160-9-4.8 MCG/ACT AERO INHALE 2 PUFFS TWICE DAILY 11 g 0   buPROPion (WELLBUTRIN XL) 150 MG 24 hr tablet Take 1 tablet (150 mg total) by mouth daily. Take along with 300 mg daily 90 tablet 1   buPROPion (WELLBUTRIN XL) 300 MG 24 hr tablet Take 1 tablet (300 mg total) by mouth daily. Take along with 150 mg daily 90 tablet 1   Continuous Glucose Sensor (DEXCOM G7 SENSOR) MISC Use one every 10 days for uncontrolled diabetes E11.65 3 each 11   dapagliflozin propanediol (FARXIGA) 10 MG TABS tablet Take 1 tablet (10 mg total) by mouth daily before breakfast. 90 tablet 2   DULoxetine (CYMBALTA) 30 MG capsule Take 1 capsule (30 mg total) by mouth daily. Take along with 60 mg daily , total of 90 mg daily 90 capsule 3   DULoxetine (CYMBALTA) 60 MG capsule Take 1 capsule (60 mg total) by mouth daily. Take along with 30 mg daily 90 capsule 1   ezetimibe (ZETIA) 10 MG tablet Take 1 tablet (10 mg total) by mouth daily. 90 tablet 3   famotidine (PEPCID) 20 MG tablet Take 1 tablet by mouth twice daily 180 tablet 0   gabapentin (NEURONTIN) 300 MG capsule Take 1 capsule (300 mg total) by mouth 3 (three) times daily. 90 capsule 0   glucose blood (ACCU-CHEK GUIDE) test strip Use 1 test strip to check fasting glucose once daily and as need for diabetes  100 each 12   insulin glargine, 2 Unit Dial, (TOUJEO MAX SOLOSTAR) 300 UNIT/ML Solostar Pen Inject 70 Units into the skin daily. 18 mL 3   insulin lispro (HUMALOG) 200 UNIT/ML KwikPen Inject insulin into the skin with meals 3 times daily per sliding scale provided to patient. Max 24 hr dose: 42 units 3 mL 5   Insulin Pen Needle 32G X 4 MM MISC Use with daily dose insulin and with sliding scale insulin as needed. 100 each 5   lidocaine (LIDODERM) 5 % Place 1 patch onto the skin daily. Remove & Discard patch within 12 hours or as directed by MD 30 patch 0   Melatonin 10 MG CAPS Take 30 mg by mouth at bedtime.     metoprolol tartrate (LOPRESSOR) 100 MG tablet Take 1 tablet by mouth twice daily 180 tablet 0   nitroGLYCERIN (NITROSTAT) 0.4 MG SL tablet Place 1 tablet (0.4 mg total) under the tongue every 5 (five) minutes x 3 doses as needed for chest pain. 30 tablet 1   pantoprazole (PROTONIX) 40 MG tablet  Take 1 tablet by mouth once daily 90 tablet 0   Semaglutide, 1 MG/DOSE, 4 MG/3ML SOPN Inject 1 mg into the skin once a week. 9 mL 1   tamsulosin (FLOMAX) 0.4 MG CAPS capsule Take 1 capsule by mouth once daily 90 capsule 0   temazepam (RESTORIL) 30 MG capsule TAKE 1 CAPSULE BY MOUTH AT BEDTIME AS NEEDED FOR SLEEP 30 capsule 5   traMADol (ULTRAM) 50 MG tablet Take one tab po bid as needed for pain of right rib cage 60 tablet 1   varenicline (CHANTIX) 1 MG tablet Take 1 tablet (1 mg total) by mouth 2 (two) times daily. 60 tablet 3   No current facility-administered medications for this visit.    REVIEW OF SYSTEMS:   Pertinent information mentioned in HPI All other systems were reviewed with the patient and are negative.  PHYSICAL EXAMINATION: ECOG PERFORMANCE STATUS: 1 - Symptomatic but completely ambulatory   Vitals:   05/15/23 1310  BP: (!) 142/82  Pulse: 88  Resp: 18  Temp: 98.5 F (36.9 C)  SpO2: 99%    Filed Weights   05/15/23 1310  Weight: 215 lb (97.5 kg)     GENERAL:alert,  no distress and comfortable SKIN: skin color, texture, turgor are normal, no rashes or significant lesions EYES: normal, conjunctiva are pink and non-injected, sclera clear OROPHARYNX:no exudate, no erythema and lips, buccal mucosa, and tongue normal  NECK: supple, thyroid normal size, non-tender, without nodularity LYMPH:  no palpable lymphadenopathy in the cervical, axillary or inguinal LUNGS: clear to auscultation and percussion with normal breathing effort HEART: regular rate & rhythm and no murmurs and no lower extremity edema ABDOMEN:abdomen soft, non-tender and normal bowel sounds Musculoskeletal:no cyanosis of digits and no clubbing  PSYCH: alert & oriented x 3 with fluent speech NEURO: no focal motor/sensory deficits  LABORATORY DATA:  I have reviewed the data as listed Lab Results  Component Value Date   WBC 7.2 05/15/2023   HGB 14.7 05/15/2023   HCT 42.3 05/15/2023   MCV 87.9 05/15/2023   PLT 198 05/15/2023   Recent Labs    08/23/22 1828 10/25/22 1120 11/20/22 2326 11/22/22 0235 01/24/23 1422 01/24/23 1506 03/29/23 0935 05/15/23 1249  NA 135   < > 132* 130* 135  --  138 134*  K 4.4   < > 3.9 3.9 5.0  --  5.1 4.3  CL 104   < > 94* 94* 95*  --  99 101  CO2 23   < > 25 23 22   --  25 23  GLUCOSE 245*   < > 136* 206* 335*  --  152* 153*  BUN 15   < > 14 21* 14  --  13 12  CREATININE 1.18   < > 1.16 1.24 1.13 1.00 1.02 0.95  CALCIUM 9.0   < > 8.9 8.6* 9.8  --  9.8 9.1  GFRNONAA >60   < > >60 >60  --   --   --  >60  PROT 7.2   < > 7.2  --  7.0  --   --  7.7  ALBUMIN 3.8   < > 2.9*  --  4.3  --   --  4.1  AST 21   < > 13*  --  18  --   --  18  ALT 17   < > 16  --  18  --   --  18  ALKPHOS 110   < > 111  --  146*  --   --  104  BILITOT 0.4   < > 0.7  --  0.2  --   --  0.5  BILIDIR <0.1  --   --   --   --   --   --   --   IBILI NOT CALCULATED  --   --   --   --   --   --   --    < > = values in this interval not displayed.    RADIOGRAPHIC STUDIES: I have  personally reviewed the radiological images as listed and agreed with the findings in the report. CT Chest W Contrast Result Date: 05/07/2023 CLINICAL DATA:  Six-month follow-up examination. Right lower lobe lung cancer. * Tracking Code: BO * EXAM: CT CHEST WITH CONTRAST TECHNIQUE: Multidetector CT imaging of the chest was performed during intravenous contrast administration. RADIATION DOSE REDUCTION: This exam was performed according to the departmental dose-optimization program which includes automated exposure control, adjustment of the mA and/or kV according to patient size and/or use of iterative reconstruction technique. CONTRAST:  75mL OMNIPAQUE IOHEXOL 300 MG/ML  SOLN COMPARISON:  PET-CT 09/20/2022 FINDINGS: Cardiovascular: Heart size is normal. No pericardial effusion. Aortic atherosclerosis and coronary artery calcifications. No pericardial effusion identified. Mediastinum/Nodes: Thyroid gland, trachea, and esophagus appear within normal limits. No enlarged mediastinal or hilar lymph nodes. Lungs/Pleura: Status post right lower lobe superior segmentectomy. No signs of locally recurrent tumor. Solid nodule in the left lower lobe measures 4 mm, image 103/4. Unchanged from previous exam. Upper Abdomen: No acute abnormality. Partially visualized simple appearing cyst off the anterior cortex of the left kidney measures 3.3 cm, image 174/2. No specific follow-up imaging recommended. Musculoskeletal: No chest wall abnormality. No acute or significant osseous findings. IMPRESSION: 1. Status post right lower lobe superior segmentectomy. No signs of locally recurrent tumor. 2. No signs of metastatic disease. 3. Stable 4 mm solid nodule in the left lower lobe. 4. Coronary artery calcifications. 5.  Aortic Atherosclerosis (ICD10-I70.0). Electronically Signed   By: Signa Kell M.D.   On: 05/07/2023 11:26

## 2023-05-29 ENCOUNTER — Encounter: Payer: Self-pay | Admitting: Nurse Practitioner

## 2023-05-29 ENCOUNTER — Telehealth: Payer: 59 | Admitting: Psychiatry

## 2023-05-29 ENCOUNTER — Encounter: Payer: Self-pay | Admitting: Psychiatry

## 2023-05-29 DIAGNOSIS — R52 Pain, unspecified: Secondary | ICD-10-CM

## 2023-05-29 DIAGNOSIS — F4310 Post-traumatic stress disorder, unspecified: Secondary | ICD-10-CM | POA: Diagnosis not present

## 2023-05-29 DIAGNOSIS — F418 Other specified anxiety disorders: Secondary | ICD-10-CM

## 2023-05-29 DIAGNOSIS — G4701 Insomnia due to medical condition: Secondary | ICD-10-CM | POA: Diagnosis not present

## 2023-05-29 DIAGNOSIS — F17210 Nicotine dependence, cigarettes, uncomplicated: Secondary | ICD-10-CM

## 2023-05-29 DIAGNOSIS — F17200 Nicotine dependence, unspecified, uncomplicated: Secondary | ICD-10-CM

## 2023-05-29 DIAGNOSIS — F3342 Major depressive disorder, recurrent, in full remission: Secondary | ICD-10-CM | POA: Diagnosis not present

## 2023-05-29 MED ORDER — BUPROPION HCL ER (XL) 150 MG PO TB24: 150.0000 mg | ORAL_TABLET | Freq: Every day | ORAL | 3 refills | Status: DC

## 2023-05-29 MED ORDER — DULOXETINE HCL 60 MG PO CPEP
60.0000 mg | ORAL_CAPSULE | Freq: Every day | ORAL | 3 refills | Status: AC
Start: 2023-05-29 — End: ?

## 2023-05-29 MED ORDER — BUPROPION HCL ER (XL) 300 MG PO TB24: 300.0000 mg | ORAL_TABLET | Freq: Every day | ORAL | 3 refills | Status: DC

## 2023-05-29 NOTE — Patient Instructions (Signed)

## 2023-05-29 NOTE — Progress Notes (Signed)
 Virtual Visit via Video Note  I connected with Raymond [name] on 05/29/23 at 10:00 AM EST by a video enabled telemedicine application and verified that I am speaking with the correct person using two identifiers.  Location Provider Location : [facility] Patient Location : Home  Participants: Patient , Provider    I discussed the limitations of evaluation and management by telemedicine and the availability of in person appointments. The patient expressed understanding and agreed to proceed.    I discussed the assessment and treatment plan with the patient. The patient was provided an opportunity to ask questions and all were answered. The patient agreed with the plan and demonstrated an understanding of the instructions.   The patient was advised to call back or seek an in-person evaluation if the symptoms worsen or if the condition fails to improve as anticipated.   BH MD OP Progress Note  05/31/2023 9:57 AM Raymond [name]  MRN:  [id number]  Chief Complaint:  Chief Complaint  Patient presents with   Follow-up   Medication Refill   Depression   Anxiety   Insomnia   HPI: Raymond [name] is a 58 year old Caucasian male, lives in [place], divorced, has a history of MDD, PTSD, insomnia, anxiety, tobacco use disorder, hyperlipidemia, diabetes mellitus, hepatic steatosis, lung cancer with a right lower lobe superior segmentectomy-10/27/2022 was evaluated by telemedicine today.  The patient, with a history of primary adenocarcinoma of right lung, status post lung surgery, depression, anxiety PTSD and insomnia, presents for follow-up for medication management.  He experiences persistent pain on the side where lung surgery was performed, describing it as soreness in the ribs. The pain is rated as 8 out of 10 and is exacerbated by touch or lying on that side. Tramadol  has been prescribed for pain management, but it is reported to be ineffective. A recent consultation with his oncologist  indicated that the pain does not appear to be related to cancer recurrence and suggested it might be due to nerve damage from the surgery. He manages to sleep well at night despite the pain by lying on his left side.  He continues to smoke about half a pack of cigarettes per day, reduced from a previous pack and a half. He is taking Chantix  at a dose of 1 mg twice a day to aid in quitting smoking. He wants to quit smoking and has considered using nicotine  gum as an additional aid. He engages in activities such as playing video games and walking his dog to distract himself from smoking.  Denies symptoms of depression, anxiety, or trauma-related symptoms are reported. Memory is generally good, although he can become distracted.Raymond [name] He finds support in talking to his children and ex-wife.  Denies any suicidality, homicidality or perceptual disturbances.  Patient denies any side effects to medications, compliant on them.  Visit Diagnosis:    ICD-10-CM   1. MDD (major depressive disorder), recurrent, in full remission (HCC)  F33.42 buPROPion  (WELLBUTRIN  XL) 150 MG 24 hr tablet    2. PTSD (post-traumatic stress disorder)  F43.10 DULoxetine  (CYMBALTA ) 60 MG capsule    3. Insomnia due to medical condition  G47.01    Pain    4. Other specified anxiety disorders  F41.8    Generalized anxiety not occurring more days than not    5. Tobacco use disorder  F17.200 buPROPion  (WELLBUTRIN  XL) 300 MG 24 hr tablet      Past Psychiatric History: I have reviewed past psychiatric history from progress note on 05/23/2018.  Past trials of Tegretol , doxepin, Seroquel , mirtazapine , Belsomra , Lunesta, Ativan , risperidone, Lexapro   Past Medical History:  Past Medical History:  Diagnosis Date   Anxiety    Arthritis    NECK   Bronchitis    Coronary artery disease    Depression    Diabetes mellitus without complication (HCC)    Diastasis of rectus abdominis 06/19/2018   Dyspnea    DUE TO GALLBLADDER PER PT    Dysrhythmia    fast hear rate at times. stopped when placed on metoprolol    Emphysema lung (HCC)    Family history of adverse reaction to anesthesia    N/V, TROUBLE BREATHING COMING OUT OF ANESTHESIA   Fatty liver    GERD (gastroesophageal reflux disease)    Headache    MIGRAINES   Hyperlipidemia    Hypertension    Irregular heart beat unk   Myocardial infarction (HCC) 2010   MILD    PTSD (post-traumatic stress disorder)    Sleep apnea    USES CPAP   Tachycardia    Ventral hernia     Past Surgical History:  Procedure Laterality Date    ingrown toenail removal     CHOLECYSTECTOMY N/A 01/23/2019   Procedure: LAPAROSCOPIC CHOLECYSTECTOMY;  Surgeon: Desiderio Schanz, MD;  Location: [facility] ORS;  Service: General;  Laterality: N/A;   CLAVICLE SURGERY Right    COLONOSCOPY  2017   COLONOSCOPY WITH PROPOFOL  N/A 08/25/2020   Procedure: COLONOSCOPY WITH PROPOFOL ;  Surgeon: Janalyn [name] NOVAK, MD;  Location: [facility] END[facility]COPY;  Service: Endoscopy;  Laterality: N/A;   ESOPHAGOGASTRODUODENOSCOPY (EGD) WITH PROPOFOL  N/A 08/25/2020   Procedure: ESOPHAGOGASTRODUODENOSCOPY (EGD) WITH PROPOFOL ;  Surgeon: Janalyn [name] NOVAK, MD;  Location: [facility] ENDOSCOPY;  Service: Endoscopy;  Laterality: N/A;   IR THORACENTESIS ASP PLEURAL SPACE W/IMG GUIDE  11/20/2022   LYMPH NODE DISSECTION Right 10/27/2022   Procedure: LYMPH NODE DISSECTION;  Surgeon: Kerrin Elspeth BROCKS, MD;  Location: [facility];  Service: Thoracic;  Laterality: Right;   MOUTH SURGERY     REMOVED ALL TEETH   SHOULDER SURGERY Right    UPPER GI ENDOSCOPY  2017   XI ROBOTIC ASSISTED THORACOSCOPY- SEGMENTECTOMY Right 10/27/2022   Procedure: XI ROBOTIC ASSISTED THORACOSCOPY-RIGHT LOWER LOBE SUPERIOR SEGMENTECTOMY;  Surgeon: Kerrin Elspeth BROCKS, MD;  [facility]: [facility];  Service: Thoracic;  Laterality: Right;    Family Psychiatric History: Reviewed family psychiatric history from progress note on 05/23/2018.  Family History:  Family History  Problem  Relation Age of Onset   Hypertension Mother    Diabetes type II Mother    Diabetes Mother    COPD Father    Lung cancer Father    Heart disease Father    Diabetes Sister    Anxiety disorder Sister    Depression Sister    Diabetes Sister    Anxiety disorder Sister    Depression Sister    Diabetes Sister    Anxiety disorder Sister    Depression Sister    Diabetes Sister    Anxiety disorder Sister    Depression Sister    Diabetes Brother    Anxiety disorder Brother    Depression Brother    Diabetes Maternal Aunt    Colon cancer Maternal Uncle     Social History: Reviewed social history from progress note on 05/23/2018. Social History   Socioeconomic History   Marital status: Divorced    Spouse name: Not on file   Number of children: 4   Years of education: Not on file  Highest education level: 8th grade  Occupational History   Not on file  Tobacco Use   Smoking status: Every Day    Current packs/day: 0.25    Average packs/day: 0.3 packs/day for 40.0 years (10.0 ttl pk-yrs)    Types: Cigarettes   Smokeless tobacco: Former    Types: Chew   Tobacco comments:    2-3 cigarettes per day   Vaping Use   Vaping status: Former  Substance and Sexual Activity   Alcohol use: No    Alcohol/week: 0.0 standard drinks of alcohol   Drug use: No   Sexual activity: Not Currently  Other Topics Concern   Not on file  Social History Narrative   Not on file   Social Drivers of Health   Financial Resource Strain: Low Risk  (08/17/2020)   Overall Financial Resource Strain (CARDIA)    Difficulty of Paying Living Expenses: Not very hard  Food Insecurity: Food Insecurity Present (12/14/2022)   Hunger Vital Sign    Worried About Running Out of Food in the Last Year: Often true    Ran Out of Food in the Last Year: Often true  Transportation Needs: No Transportation Needs (12/14/2022)   PRAPARE - Administrator, Civil Service (Medical): No    Lack of Transportation  (Non-Medical): No  Physical Activity: Inactive (05/23/2018)   Exercise Vital Sign    Days of Exercise per Week: 0 days    Minutes of Exercise per Session: 0 min  Stress: Stress Concern Present (05/23/2018)   [employer] of Occupational Health - Occupational Stress Questionnaire    Feeling of Stress : Very much  Social Connections: Unknown (05/23/2018)   Social Connection and Isolation Panel [NHANES]    Frequency of Communication with Friends and Family: Not on file    Frequency of Social Gatherings with Friends and Family: Not on file    Attends Religious Services: Never    Active Member of Clubs or Organizations: No    Attends Banker Meetings: Never    Marital Status: Divorced    Allergies:  Allergies  Allergen Reactions   Onion Anaphylaxis   Norco [Hydrocodone-Acetaminophen ] Itching   Nickel Rash   Nicoderm [Nicotine ] Rash    Metabolic Disorder Labs: Lab Results  Component Value Date   HGBA1C 9.0 (H) 03/29/2023   MPG 205.86 10/25/2022   MPG 294.83 05/31/2017   No results found for: PROLACTIN Lab Results  Component Value Date   CHOL 151 11/11/2021   TRIG 251 (H) 11/11/2021   HDL 34 (L) 11/11/2021   CHOLHDL 4.4 11/11/2021   VLDL 26 12/09/2012   LDLCALC 76 11/11/2021   LDLCALC 153 (H) 08/17/2020   Lab Results  Component Value Date   TSH 1.890 08/17/2020   TSH 4.120 06/10/2018    Therapeutic Level Labs: No results found for: LITHIUM No results found for: VALPROATE No results found for: CBMZ  Current Medications: Current Outpatient Medications  Medication Sig Dispense Refill   meloxicam  (MOBIC ) 15 MG tablet Take 15 mg by mouth daily.     Accu-Chek Softclix Lancets lancets Use 1 lancet to check glucose once daily and as needed for diabetes 100 each 12   acetaminophen  (TYLENOL ) 500 MG tablet Take 2 tablets (1,000 mg total) by mouth 4 (four) times daily. 30 tablet 0   albuterol  (VENTOLIN  HFA) 108 (90 Base) MCG/ACT inhaler INHALE 2 PUFFS  INTO THE LUNGS EVERY 6 HOURS AS MEEDED FOR WHEEZING OR SHORTNESS OF BREATH 18 g 1  aspirin  EC 81 MG tablet Take 81 mg by mouth daily at 3 pm.      atorvastatin  (LIPITOR) 40 MG tablet Take 1 tablet (40 mg total) by mouth daily. 90 tablet 3   Blood Glucose Monitoring Suppl (ACCU-CHEK GUIDE ME) w/Device KIT Use as directed. E11.65 1 kit 0   BREZTRI  AEROSPHERE 160-9-4.8 MCG/ACT AERO INHALE 2 PUFFS TWICE DAILY 11 g 0   buPROPion  (WELLBUTRIN  XL) 150 MG 24 hr tablet Take 1 tablet (150 mg total) by mouth daily. Take along with 300 mg daily 90 tablet 3   buPROPion  (WELLBUTRIN  XL) 300 MG 24 hr tablet Take 1 tablet (300 mg total) by mouth daily. Take along with 150 mg daily 90 tablet 3   Continuous Glucose Sensor (DEXCOM G7 SENSOR) MISC Use one every 10 days for uncontrolled diabetes E11.65 3 each 11   dapagliflozin  propanediol (FARXIGA ) 10 MG TABS tablet Take 1 tablet (10 mg total) by mouth daily before breakfast. 90 tablet 2   DULoxetine  (CYMBALTA ) 30 MG capsule Take 1 capsule (30 mg total) by mouth daily. Take along with 60 mg daily , total of 90 mg daily 90 capsule 3   DULoxetine  (CYMBALTA ) 60 MG capsule Take 1 capsule (60 mg total) by mouth daily. Take along with 30 mg daily 90 capsule 3   ezetimibe  (ZETIA ) 10 MG tablet Take 1 tablet (10 mg total) by mouth daily. 90 tablet 3   famotidine  (PEPCID ) 20 MG tablet Take 1 tablet by mouth twice daily 180 tablet 0   gabapentin  (NEURONTIN ) 300 MG capsule Take 1 capsule (300 mg total) by mouth 3 (three) times daily. 90 capsule 0   glucose blood (ACCU-CHEK GUIDE) test strip Use 1 test strip to check fasting glucose once daily and as need for diabetes 100 each 12   insulin  glargine, 2 Unit Dial , (TOUJEO  MAX SOLOSTAR) 300 UNIT/ML Solostar Pen Inject 70 Units into the skin daily. 18 mL 3   insulin  lispro (HUMALOG ) 200 UNIT/ML KwikPen Inject insulin  into the skin with meals 3 times daily per sliding scale provided to patient. Max 24 hr dose: 42 units 3 mL 5   Insulin   Pen Needle 32G X 4 MM MISC Use with daily dose insulin  and with sliding scale insulin  as needed. 100 each 5   lidocaine  (LIDODERM ) 5 % Place 1 patch onto the skin daily. Remove & Discard patch within 12 hours or as directed by MD 30 patch 0   Melatonin 10 MG CAPS Take 30 mg by mouth at bedtime.     metoprolol  tartrate (LOPRESSOR ) 100 MG tablet Take 1 tablet by mouth twice daily 180 tablet 0   nitroGLYCERIN  (NITROSTAT ) 0.4 MG SL tablet Place 1 tablet (0.4 mg total) under the tongue every 5 (five) minutes x 3 doses as needed for chest pain. 30 tablet 1   pantoprazole  (PROTONIX ) 40 MG tablet Take 1 tablet by mouth once daily 90 tablet 0   Semaglutide , 1 MG/DOSE, 4 MG/3ML SOPN Inject 1 mg into the skin once a week. 9 mL 1   tamsulosin  (FLOMAX ) 0.4 MG CAPS capsule Take 1 capsule by mouth once daily 90 capsule 0   temazepam  (RESTORIL ) 30 MG capsule TAKE 1 CAPSULE BY MOUTH AT BEDTIME AS NEEDED FOR SLEEP 30 capsule 5   traMADol  (ULTRAM ) 50 MG tablet Take one tab po bid as needed for pain of right rib cage 60 tablet 1   varenicline  (CHANTIX ) 1 MG tablet Take 1 tablet (1 mg total) by mouth 2 (  two) times daily. 60 tablet 3   No current facility-administered medications for this visit.     Musculoskeletal: Strength & Muscle Tone:  UTA Gait & Station:  Seated Patient leans: N/A  Psychiatric Specialty Exam: Review of Systems  Psychiatric/Behavioral:  The patient is nervous/anxious.     There were no vitals taken for this visit.There is no height or weight on file to calculate BMI.  General Appearance: Casual  Eye Contact:  Fair  Speech:  Clear and Coherent  Volume:  Normal  Mood:  Anxious managing well.  Affect:  Appropriate  Thought Process:  Goal Directed and Descriptions of Associations: Intact  Orientation:  Full (Time, Place, and Person)  Thought Content: Logical   Suicidal Thoughts:  No  Homicidal Thoughts:  No  Memory:  Immediate;   Fair Recent;   Fair Remote;   Fair  Judgement:   Fair  Insight:  Fair  Psychomotor Activity:  Normal  Concentration:  Concentration: Fair and Attention Span: Fair  Recall:  [employer] of Knowledge: Fair  Language: Fair  Akathisia:  No  Handed:  Right  AIMS (if indicated): not done  Assets:  Desire for Improvement Housing Social Support  ADL's:  Intact  Cognition: WNL  Sleep:  Fair   Screenings: GAD-7    Garment/textile Technologist Visit from 02/22/2023 in [facility] Office Visit from 02/09/2022 in [facility] Video Visit from 12/20/2021 in [facility] Video Visit from 06/20/2021 in [facility] Office Visit from 11/22/2020 in [facility]  Total GAD-7 Score 5 7 6 8 11       Mini-Mental    Flowsheet Row Office Visit from 10/23/2022 in [facility] Office Visit from 10/19/2021 in [facility] Clinical Support from 10/18/2020 in [facility] Clinical Support from 10/17/2019 in [facility], [facility] Clinical Support from 10/08/2018 in [facility], [facility]  Total Score (max 30 points ) 30 30 29 30 27       PHQ2-9    Flowsheet Row Office Visit from 02/22/2023 in [facility] Office Visit from 12/14/2022 in [facility] [place] Office Visit from 10/23/2022 in [facility], [facility] Office Visit from 02/09/2022 in [facility] Video Visit from 12/20/2021 in [facility]  PHQ-2 Total Score 2 1 1 2 1   PHQ-9 Total Score 7 -- -- 8 --      Flowsheet Row Video Visit from 05/29/2023 in [facility] Office Visit from 02/22/2023 in [facility] ED  to Hosp-Admission (Discharged) from 11/20/2022 in [facility] GENERAL MED/SURG UNIT  C-SSRS RISK CATEGORY Moderate Risk No Risk No Risk        Assessment and Plan: TRAYLEN ECKELS is a 58 year old Caucasian male, lives in [place], has a history of MDD, insomnia, adenocarcinoma of right lung status post lung segmentectomy, presents for medication management, discussed assessment and plan as noted below.  Major depressive disorder in remission Other specified anxiety disorder-generalized anxiety not occurring more days than not-stable Insomnia-stable Posttraumatic stress disorder-stable No current symptoms of depression, trauma related symptoms or anxiety. Medications effective. Finds support in family. Declined therapist. - Continue Cymbalta  90 mg daily - Continue Wellbutrin  XL 450 mg daily -  Continue Hydroxyzine  25-50 mg at bedtime as needed - Continue CPAP for OSA-sleep study-01/05/2021 - Periodic limb movement - Continue Temazepam  30 mg at bedtime  Tobacco use disorder-improving Smokes half a pack per day, reduced from a pack and a half. Taking Chantix  1 mg twice daily. Discussed distraction techniques and lifestyle changes for cessation. - Continue Chantix  1 mg twice daily - Encourage distractions: music, baths, support groups, walks, video games - Suggest nicotine  gum or sugar-free gum - Encourage lifestyle changes: join a gym, engage in physical activities  Follow-up - Schedule follow-up appointment for Sep 14, 2023, at 11:40 AM.   Collaboration of Care: Collaboration of Care: Patient refused AEB patient declined referral to CBT  Patient/Guardian was advised Release of Information must be obtained prior to any record release in order to collaborate their care with an outside provider. Patient/Guardian was advised if they have not already done so to contact the registration department to sign all necessary forms in order for us  to release information regarding their care.    Consent: Patient/Guardian gives verbal consent for treatment and assignment of benefits for services provided during this visit. Patient/Guardian expressed understanding and agreed to proceed.   This note was generated in part or whole with voice recognition software. Voice recognition is usually quite accurate but there are transcription errors that can and very often do occur. I apologize for any typographical errors that were not detected and corrected.    Avielle Imbert, MD 05/31/2023, 9:57 AM

## 2023-06-08 ENCOUNTER — Other Ambulatory Visit: Payer: Self-pay | Admitting: Psychiatry

## 2023-06-08 DIAGNOSIS — F17200 Nicotine dependence, unspecified, uncomplicated: Secondary | ICD-10-CM

## 2023-07-05 ENCOUNTER — Emergency Department

## 2023-07-05 ENCOUNTER — Other Ambulatory Visit: Payer: Self-pay

## 2023-07-05 ENCOUNTER — Emergency Department
Admission: EM | Admit: 2023-07-05 | Discharge: 2023-07-05 | Discharge status: Against Medical Advice | Attending: Student in an Organized Health Care Education/Training Program | Admitting: Student in an Organized Health Care Education/Training Program

## 2023-07-05 DIAGNOSIS — Z85118 Personal history of other malignant neoplasm of bronchus and lung: Secondary | ICD-10-CM | POA: Insufficient documentation

## 2023-07-05 DIAGNOSIS — R0602 Shortness of breath: Secondary | ICD-10-CM | POA: Insufficient documentation

## 2023-07-05 DIAGNOSIS — Z5321 Procedure and treatment not carried out due to patient leaving prior to being seen by health care provider: Secondary | ICD-10-CM | POA: Insufficient documentation

## 2023-07-05 LAB — CBC
HCT: 45.3 % (ref 39.0–52.0)
Hemoglobin: 15.5 g/dL (ref 13.0–17.0)
MCH: 30.6 pg (ref 26.0–34.0)
MCHC: 34.2 g/dL (ref 30.0–36.0)
MCV: 89.5 fL (ref 80.0–100.0)
Platelets: 224 10*3/uL (ref 150–400)
RBC: 5.06 MIL/uL (ref 4.22–5.81)
RDW: 12.4 % (ref 11.5–15.5)
WBC: 6.9 10*3/uL (ref 4.0–10.5)
nRBC: 0 % (ref 0.0–0.2)

## 2023-07-05 LAB — TROPONIN I (HIGH SENSITIVITY): Troponin I (High Sensitivity): 5 ng/L (ref <18)

## 2023-07-05 LAB — BASIC METABOLIC PANEL
Anion gap: 12 (ref 5–15)
BUN: 9 mg/dL (ref 6–20)
CO2: 23 mmol/L (ref 22–32)
Calcium: 9.9 mg/dL (ref 8.9–10.3)
Chloride: 100 mmol/L (ref 98–111)
Creatinine, Ser: 0.85 mg/dL (ref 0.61–1.24)
GFR, Estimated: >60 mL/min (ref >60)
Glucose, Bld: 127 mg/dL — ABNORMAL HIGH (ref 70–99)
Potassium: 3.8 mmol/L (ref 3.5–5.1)
Sodium: 135 mmol/L (ref 135–145)

## 2023-07-05 NOTE — ED Triage Notes (Signed)
 Pt sts that he has been having SOB with any type of activity. Pt does have a hx of having lung cancer that was removed from the left lung last July.

## 2023-07-06 ENCOUNTER — Other Ambulatory Visit: Payer: Self-pay | Admitting: Nurse Practitioner

## 2023-07-06 ENCOUNTER — Other Ambulatory Visit: Payer: Self-pay | Admitting: Internal Medicine

## 2023-07-06 DIAGNOSIS — K219 Gastro-esophageal reflux disease without esophagitis: Secondary | ICD-10-CM

## 2023-07-07 ENCOUNTER — Other Ambulatory Visit: Payer: Self-pay | Admitting: Nurse Practitioner

## 2023-07-07 DIAGNOSIS — E1169 Type 2 diabetes mellitus with other specified complication: Secondary | ICD-10-CM

## 2023-07-07 DIAGNOSIS — Z794 Long term (current) use of insulin: Secondary | ICD-10-CM

## 2023-07-09 ENCOUNTER — Other Ambulatory Visit: Payer: Self-pay | Admitting: Nurse Practitioner

## 2023-07-09 DIAGNOSIS — Z79899 Other long term (current) drug therapy: Secondary | ICD-10-CM

## 2023-07-09 DIAGNOSIS — E1169 Type 2 diabetes mellitus with other specified complication: Secondary | ICD-10-CM

## 2023-07-09 NOTE — Telephone Encounter (Signed)
 Please review ok to send

## 2023-07-09 NOTE — Telephone Encounter (Signed)
 Please review

## 2023-07-10 ENCOUNTER — Other Ambulatory Visit: Payer: Self-pay | Admitting: Psychiatry

## 2023-07-10 DIAGNOSIS — F17200 Nicotine dependence, unspecified, uncomplicated: Secondary | ICD-10-CM

## 2023-07-13 ENCOUNTER — Other Ambulatory Visit: Payer: Self-pay | Admitting: Nurse Practitioner

## 2023-07-13 DIAGNOSIS — R0781 Pleurodynia: Secondary | ICD-10-CM

## 2023-07-16 ENCOUNTER — Other Ambulatory Visit: Payer: Self-pay | Admitting: Nurse Practitioner

## 2023-07-16 DIAGNOSIS — Z79899 Other long term (current) drug therapy: Secondary | ICD-10-CM

## 2023-07-16 NOTE — Telephone Encounter (Signed)
Please review send

## 2023-08-01 ENCOUNTER — Other Ambulatory Visit: Payer: Self-pay | Admitting: Nurse Practitioner

## 2023-08-01 DIAGNOSIS — E1169 Type 2 diabetes mellitus with other specified complication: Secondary | ICD-10-CM

## 2023-08-01 DIAGNOSIS — Z794 Long term (current) use of insulin: Secondary | ICD-10-CM

## 2023-08-08 ENCOUNTER — Ambulatory Visit: Payer: 59 | Admitting: Nurse Practitioner

## 2023-08-08 ENCOUNTER — Encounter: Payer: Self-pay | Admitting: Nurse Practitioner

## 2023-08-08 VITALS — BP 124/78 | HR 92 | Temp 98.5°F | Resp 16 | Ht 69.0 in | Wt 210.6 lb

## 2023-08-08 DIAGNOSIS — Z794 Long term (current) use of insulin: Secondary | ICD-10-CM | POA: Diagnosis not present

## 2023-08-08 DIAGNOSIS — E1165 Type 2 diabetes mellitus with hyperglycemia: Secondary | ICD-10-CM

## 2023-08-08 DIAGNOSIS — E1159 Type 2 diabetes mellitus with other circulatory complications: Secondary | ICD-10-CM

## 2023-08-08 DIAGNOSIS — I700 Atherosclerosis of aorta: Secondary | ICD-10-CM

## 2023-08-08 DIAGNOSIS — E1169 Type 2 diabetes mellitus with other specified complication: Secondary | ICD-10-CM

## 2023-08-08 DIAGNOSIS — I152 Hypertension secondary to endocrine disorders: Secondary | ICD-10-CM

## 2023-08-08 LAB — POCT GLYCOSYLATED HEMOGLOBIN (HGB A1C): Hemoglobin A1C: 6.7 % — AB (ref 4.0–5.6)

## 2023-08-08 MED ORDER — TOUJEO MAX SOLOSTAR 300 UNIT/ML ~~LOC~~ SOPN: 50.0000 [IU] | PEN_INJECTOR | Freq: Every day | SUBCUTANEOUS | Status: DC

## 2023-08-08 MED ORDER — PREGABALIN 50 MG PO CAPS: 50.0000 mg | ORAL_CAPSULE | Freq: Three times a day (TID) | ORAL | 0 refills | Status: DC

## 2023-08-08 NOTE — Progress Notes (Signed)
 [facility] [address] [place], [place] [address]  Internal MEDICINE  Office Visit Note  Patient Name: Raymond [name]  [id number]  [id number]  Date of Service: 08/08/2023  Chief Complaint  Patient presents with   Depression   Diabetes   Gastroesophageal Reflux   Hyperlipidemia   Hypertension   Follow-up    HPI Raymond [name] presents for a follow-up visit for diabetes and neuropathy.  Diabetes -- A1c significantly improved to 6.7 from 9.0 in December 2024. Toujeo  dose is down to 50 units daily.  Diabetic neuropathy -- gabapentin  is not helping other than with the burning feeling. Still having numbness and aching.  High cholesterol -- takes atorvastatin  and ezetimibe     Current Medication: Outpatient Encounter Medications as of 08/08/2023  Medication Sig   Accu-Chek Softclix Lancets lancets Use 1 lancet to check glucose once daily and as needed for diabetes   acetaminophen  (TYLENOL ) 500 MG tablet Take 2 tablets (1,000 mg total) by mouth 4 (four) times daily.   albuterol  (VENTOLIN  HFA) 108 (90 Base) MCG/ACT inhaler INHALE 2 PUFFS INTO THE LUNGS EVERY 6 HOURS AS MEEDED FOR WHEEZING OR SHORTNESS OF BREATH   aspirin  EC 81 MG tablet Take 81 mg by mouth daily at 3 pm.    atorvastatin  (LIPITOR) 40 MG tablet Take 1 tablet (40 mg total) by mouth daily.   Blood Glucose Monitoring Suppl (ACCU-CHEK GUIDE ME) w/Device KIT Use as directed. E11.65   BREZTRI  AEROSPHERE 160-9-4.8 MCG/ACT AERO INHALE 2 PUFFS TWICE DAILY   buPROPion  (WELLBUTRIN  XL) 150 MG 24 hr tablet Take 1 tablet (150 mg total) by mouth daily. Take along with 300 mg daily   buPROPion  (WELLBUTRIN  XL) 300 MG 24 hr tablet Take 1 tablet (300 mg total) by mouth daily. Take along with 150 mg daily   Continuous Glucose Sensor (DEXCOM G7 SENSOR) MISC Use one every 10 days for uncontrolled diabetes E11.65   dapagliflozin  propanediol (FARXIGA ) 10 MG TABS tablet Take 1 tablet (10 mg total) by mouth daily before breakfast.    DULoxetine  (CYMBALTA ) 30 MG capsule Take 1 capsule (30 mg total) by mouth daily. Take along with 60 mg daily , total of 90 mg daily   DULoxetine  (CYMBALTA ) 60 MG capsule Take 1 capsule (60 mg total) by mouth daily. Take along with 30 mg daily   ezetimibe  (ZETIA ) 10 MG tablet Take 1 tablet (10 mg total) by mouth daily.   famotidine  (PEPCID ) 20 MG tablet Take 1 tablet by mouth twice daily   glucose blood (ACCU-CHEK GUIDE) test strip Use 1 test strip to check fasting glucose once daily and as need for diabetes   insulin  lispro (HUMALOG  KWIKPEN) 200 UNIT/ML KwikPen INJECT INSULIN  INTO THE SKIN WITH MEALS THREE TIMES DAILY PER SLIDING SCALE PROVIDED. MAX DAILY DOSE IS 42 UNITS   Insulin  Pen Needle 32G X 4 MM MISC Use with daily dose insulin  and with sliding scale insulin  as needed.   lidocaine  (LIDODERM ) 5 % Place 1 patch onto the skin daily. Remove & Discard patch within 12 hours or as directed by MD   Melatonin 10 MG CAPS Take 30 mg by mouth at bedtime.   meloxicam  (MOBIC ) 15 MG tablet Take 15 mg by mouth daily.   metoprolol  tartrate (LOPRESSOR ) 100 MG tablet Take 1 tablet by mouth twice daily   nitroGLYCERIN  (NITROSTAT ) 0.4 MG SL tablet Place 1 tablet (0.4 mg total) under the tongue every 5 (five) minutes x 3 doses as needed for chest pain.   OZEMPIC ,  1 MG/DOSE, 4 MG/3ML SOPN INJECT 1 DOSE SUBCUTANEOUSLY ONCE A WEEK   pantoprazole  (PROTONIX ) 40 MG tablet Take 1 tablet by mouth once daily   pregabalin  (LYRICA ) 50 MG capsule Take 1 capsule (50 mg total) by mouth 3 (three) times daily. For 7 days then increase to 2 capsules PO 3 times daily   tamsulosin  (FLOMAX ) 0.4 MG CAPS capsule Take 1 capsule by mouth once daily   temazepam  (RESTORIL ) 30 MG capsule TAKE 1 CAPSULE BY MOUTH AT BEDTIME AS NEEDED FOR SLEEP   traMADol  (ULTRAM ) 50 MG tablet TAKE 1 TABLET BY MOUTH TWICE DAILY AS NEEDED FOR PAIN OF  RIGHT  RIB  CAGE   varenicline  (CHANTIX ) 1 MG tablet Take 1 tablet by mouth twice daily   [DISCONTINUED]  insulin  glargine, 2 Unit Dial , (TOUJEO  MAX SOLOSTAR) 300 UNIT/ML Solostar Pen Inject 70 Units into the skin daily.   gabapentin  (NEURONTIN ) 300 MG capsule Take 1 capsule (300 mg total) by mouth 3 (three) times daily.   insulin  glargine, 2 Unit Dial , (TOUJEO  MAX SOLOSTAR) 300 UNIT/ML Solostar Pen Inject 50 Units into the skin daily.   No facility-administered encounter medications on file as of 08/08/2023.    Surgical History: Past Surgical History:  Procedure Laterality Date    ingrown toenail removal     CHOLECYSTECTOMY N/A 01/23/2019   Procedure: LAPAROSCOPIC CHOLECYSTECTOMY;  Surgeon: Emmalene Hare, MD;  Location: [facility] ORS;  Service: General;  Laterality: N/A;   CLAVICLE SURGERY Right    COLONOSCOPY  2017   COLONOSCOPY WITH PROPOFOL  N/A 08/25/2020   Procedure: COLONOSCOPY WITH PROPOFOL ;  Surgeon: Irby Mannan, MD;  Location: [facility];  Service: Endoscopy;  Laterality: N/A;   ESOPHAGOGASTRODUODENOSCOPY (EGD) WITH PROPOFOL  N/A 08/25/2020   Procedure: ESOPHAGOGASTRODUODENOSCOPY (EGD) WITH PROPOFOL ;  Surgeon: Irby Mannan, MD;  Location: [facility] ENDOSCOPY;  Service: Endoscopy;  Laterality: N/A;   IR THORACENTESIS ASP PLEURAL SPACE W/IMG GUIDE  11/20/2022   LYMPH NODE DISSECTION Right 10/27/2022   Procedure: LYMPH NODE DISSECTION;  Surgeon: Zelphia Higashi, MD;  Location: [facility] OR;  Service: Thoracic;  Laterality: Right;   MOUTH SURGERY     REMOVED ALL TEETH   SHOULDER SURGERY Right    UPPER GI ENDOSCOPY  2017   XI ROBOTIC ASSISTED THORACOSCOPY- SEGMENTECTOMY Right 10/27/2022   Procedure: XI ROBOTIC ASSISTED THORACOSCOPY-RIGHT LOWER LOBE SUPERIOR SEGMENTECTOMY;  Surgeon: Zelphia Higashi, MD;  Location: [facility] OR;  Service: Thoracic;  Laterality: Right;    Medical History: Past Medical History:  Diagnosis Date   Anxiety    Arthritis    NECK   Bronchitis    Coronary artery disease    Depression    Diabetes mellitus without complication (HCC)    Diastasis of rectus  abdominis 06/19/2018   Dyspnea    DUE TO GALLBLADDER PER PT   Dysrhythmia    "fast hear rate at times. stopped when placed on metoprolol "   Emphysema lung (HCC)    Family history of adverse reaction to anesthesia    N/V, TROUBLE BREATHING COMING OUT OF ANESTHESIA   Fatty liver    GERD (gastroesophageal reflux disease)    Headache    MIGRAINES   Hyperlipidemia    Hypertension    Irregular heart beat unk   Myocardial infarction (HCC) 2010   MILD    PTSD (post-traumatic stress disorder)    Sleep apnea    USES CPAP   Tachycardia    Ventral hernia     Family History: Family History  Problem  Relation Age of Onset   Hypertension Mother    Diabetes type II Mother    Diabetes Mother    COPD Father    Lung cancer Father    Heart disease Father    Diabetes Sister    Anxiety disorder Sister    Depression Sister    Diabetes Sister    Anxiety disorder Sister    Depression Sister    Diabetes Sister    Anxiety disorder Sister    Depression Sister    Diabetes Sister    Anxiety disorder Sister    Depression Sister    Diabetes Brother    Anxiety disorder Brother    Depression Brother    Diabetes Maternal Aunt    Colon cancer Maternal Uncle     Social History   Socioeconomic History   Marital status: Divorced    Spouse name: Not on file   Number of children: 4   Years of education: Not on file   Highest education level: 8th grade  Occupational History   Not on file  Tobacco Use   Smoking status: Every Day    Current packs/day: 0.25    Average packs/day: 0.3 packs/day for 40.0 years (10.0 ttl pk-yrs)    Types: Cigarettes   Smokeless tobacco: Former    Types: Chew   Tobacco comments:    2-3 cigarettes per day   Vaping Use   Vaping status: Former  Substance and Sexual Activity   Alcohol use: No    Alcohol/week: 0.0 standard drinks of alcohol   Drug use: No   Sexual activity: Not Currently  Other Topics Concern   Not on file  Social History Narrative   Not on  file   Social Drivers of Health   Financial Resource Strain: Low Risk  (08/17/2020)   Overall Financial Resource Strain (CARDIA)    Difficulty of Paying Living Expenses: Not very hard  Food Insecurity: Food Insecurity Present (12/14/2022)   Hunger Vital Sign    Worried About Running Out of Food in the Last Year: Often true    Ran Out of Food in the Last Year: Often true  Transportation Needs: No Transportation Needs (12/14/2022)   PRAPARE - Administrator, Civil Service (Medical): No    Lack of Transportation (Non-Medical): No  Physical Activity: Inactive (05/23/2018)   Exercise Vital Sign    Days of Exercise per Week: 0 days    Minutes of Exercise per Session: 0 min  Stress: Stress Concern Present (05/23/2018)   [employer] of Occupational Health - Occupational Stress Questionnaire    Feeling of Stress : Very much  Social Connections: Unknown (05/23/2018)   Social Connection and Isolation Panel [NHANES]    Frequency of Communication with Friends and Family: Not on file    Frequency of Social Gatherings with Friends and Family: Not on file    Attends Religious Services: Never    Active Member of Clubs or Organizations: No    Attends Banker Meetings: Never    Marital Status: Divorced  Catering manager Violence: Not At Risk (12/14/2022)   Humiliation, Afraid, Rape, and Kick questionnaire    Fear of Current or Ex-Partner: No    Emotionally Abused: No    Physically Abused: No    Sexually Abused: No      Review of Systems  Constitutional:  Negative for chills, fatigue and unexpected weight change.  HENT:  Negative for congestion, rhinorrhea, sneezing and sore throat.   Eyes:  Negative for redness.  Respiratory: Negative.  Negative for cough, chest tightness, shortness of breath and wheezing.   Cardiovascular: Negative.  Negative for chest pain and palpitations.  Gastrointestinal:  Negative for abdominal pain, constipation, diarrhea, nausea and  vomiting.  Genitourinary:  Negative for dysuria, flank pain and frequency.  Musculoskeletal:  Negative for arthralgias, back pain, joint swelling and neck pain.  Skin:  Negative for color change and rash.  Neurological: Negative.  Negative for tremors and numbness.  Hematological:  Negative for adenopathy. Does not bruise/bleed easily.  Psychiatric/Behavioral:  Negative for behavioral problems (Depression), self-injury, sleep disturbance and suicidal ideas. The patient is not nervous/anxious.     Vital Signs: BP 124/78   Pulse 92   Temp 98.5 F (36.9 C)   Resp 16   Ht 5\' 9"  (1.753 m)   Wt 210 lb 9.6 oz (95.5 kg)   SpO2 96%   BMI 31.10 kg/m    Physical Exam Vitals reviewed.  Constitutional:      General: He is not in acute distress.    Appearance: Normal appearance. He is obese. He is not ill-appearing.  HENT:     Head: Normocephalic and atraumatic.  Eyes:     Pupils: Pupils are equal, round, and reactive to light.  Cardiovascular:     Rate and Rhythm: Normal rate and regular rhythm.  Pulmonary:     Effort: Pulmonary effort is normal. No respiratory distress.  Neurological:     Mental Status: He is alert and oriented to person, place, and time.  Psychiatric:        Mood and Affect: Mood normal.        Behavior: Behavior normal.        Assessment/Plan: 1. Type 2 diabetes mellitus with other specified complication, with long-term current use of insulin  (HCC) (Primary) A1c is significantly improved. Continue medications as prescribed. Pregabalin  prescribed for neuropathy. Stop gabapentin  - POCT glycosylated hemoglobin (Hb A1C)  2. Hypertension associated with diabetes (HCC) Stable, continue medications as prescribed.   3. Aortic atherosclerosis (HCC) Continue atorvastatin  and ezetimibe    General Counseling: Raymond [name] verbalizes understanding of the findings of todays visit and agrees with plan of treatment. I have discussed any further diagnostic evaluation that  may be needed or ordered today. We also reviewed his medications today. he has been encouraged to call the office with any questions or concerns that should arise related to todays visit.    Orders Placed This Encounter  Procedures   POCT glycosylated hemoglobin (Hb A1C)    Meds ordered this encounter  Medications   insulin  glargine, 2 Unit Dial , (TOUJEO  MAX SOLOSTAR) 300 UNIT/ML Solostar Pen    Sig: Inject 50 Units into the skin daily.   pregabalin  (LYRICA ) 50 MG capsule    Sig: Take 1 capsule (50 mg total) by mouth 3 (three) times daily. For 7 days then increase to 2 capsules PO 3 times daily    Dispense:  159 capsule    Refill:  0    Initial prescription, will send in new script for 100 mg capsule for next fill.    Return in about 4 weeks (around 09/05/2023) for F/U, Jonmarc Bodkin PCP, eval new med lyrica  for DN.   Total time spent:30 Minutes Time spent includes review of chart, medications, test results, and follow up plan with the patient.   [place] Controlled Substance Database was reviewed by me.  This patient was seen by Laurence Pons, FNP-C in collaboration with Dr. Verneta Gone as a part  of collaborative care agreement.   Jameson Morrow R. Bobbi Burow, MSN, FNP-C Internal medicine

## 2023-08-16 ENCOUNTER — Other Ambulatory Visit: Payer: Self-pay | Admitting: Nurse Practitioner

## 2023-08-16 DIAGNOSIS — E1165 Type 2 diabetes mellitus with hyperglycemia: Secondary | ICD-10-CM

## 2023-09-05 ENCOUNTER — Encounter: Payer: Self-pay | Admitting: Nurse Practitioner

## 2023-09-05 ENCOUNTER — Ambulatory Visit (INDEPENDENT_AMBULATORY_CARE_PROVIDER_SITE_OTHER): Admitting: Nurse Practitioner

## 2023-09-05 VITALS — BP 114/80 | HR 100 | Temp 98.3°F | Resp 16 | Ht 69.0 in | Wt 207.4 lb

## 2023-09-05 DIAGNOSIS — E1159 Type 2 diabetes mellitus with other circulatory complications: Secondary | ICD-10-CM

## 2023-09-05 DIAGNOSIS — I152 Hypertension secondary to endocrine disorders: Secondary | ICD-10-CM | POA: Diagnosis not present

## 2023-09-05 DIAGNOSIS — E1142 Type 2 diabetes mellitus with diabetic polyneuropathy: Secondary | ICD-10-CM | POA: Insufficient documentation

## 2023-09-05 DIAGNOSIS — E1169 Type 2 diabetes mellitus with other specified complication: Secondary | ICD-10-CM

## 2023-09-05 DIAGNOSIS — F331 Major depressive disorder, recurrent, moderate: Secondary | ICD-10-CM

## 2023-09-05 DIAGNOSIS — I700 Atherosclerosis of aorta: Secondary | ICD-10-CM

## 2023-09-05 DIAGNOSIS — Z794 Long term (current) use of insulin: Secondary | ICD-10-CM

## 2023-09-05 DIAGNOSIS — I5032 Chronic diastolic (congestive) heart failure: Secondary | ICD-10-CM | POA: Insufficient documentation

## 2023-09-05 DIAGNOSIS — J432 Centrilobular emphysema: Secondary | ICD-10-CM | POA: Diagnosis not present

## 2023-09-05 DIAGNOSIS — E119 Type 2 diabetes mellitus without complications: Secondary | ICD-10-CM | POA: Insufficient documentation

## 2023-09-05 MED ORDER — TOUJEO MAX SOLOSTAR 300 UNIT/ML ~~LOC~~ SOPN: 50.0000 [IU] | PEN_INJECTOR | Freq: Every day | SUBCUTANEOUS | 3 refills | Status: DC

## 2023-09-05 MED ORDER — PREGABALIN 50 MG PO CAPS: 50.0000 mg | ORAL_CAPSULE | Freq: Three times a day (TID) | ORAL | 0 refills | Status: DC

## 2023-09-05 NOTE — Progress Notes (Signed)
 [facility] [address] [place], [place] [address]   Internal MEDICINE  Office Visit Note  Patient Name: Raymond [name]  [id number]  [id number]  Date of Service: 09/05/2023  Chief Complaint  Patient presents with   Depression   Diabetes   Gastroesophageal Reflux   Hyperlipidemia   Hypertension   Follow-up    HPI Raymond [name] presents for a follow-up visit for diabetes, neuropathy and hypertension.  Diabetes -- needs toujeo  refills, is doing well with glucose levels.  Neuropathy -- was not able to start pregabalin  yet, needs prior authorization Hypertension -- controlled with current medications  Depression -- sees psychiatry and is taking duloxetine  and bupropion  COPD -- sees pulmonary and is currently on breztri  inhaler CHF -- taking metoprolol , cholesterol medication, and farxiga .     Current Medication: Outpatient Encounter Medications as of 09/05/2023  Medication Sig   Accu-Chek Softclix Lancets lancets Use 1 lancet to check glucose once daily and as needed for diabetes   acetaminophen  (TYLENOL ) 500 MG tablet Take 2 tablets (1,000 mg total) by mouth 4 (four) times daily.   albuterol  (VENTOLIN  HFA) 108 (90 Base) MCG/ACT inhaler INHALE 2 PUFFS INTO THE LUNGS EVERY 6 HOURS AS MEEDED FOR WHEEZING OR SHORTNESS OF BREATH   aspirin  EC 81 MG tablet Take 81 mg by mouth daily at 3 pm.    atorvastatin  (LIPITOR) 40 MG tablet Take 1 tablet (40 mg total) by mouth daily.   Blood Glucose Monitoring Suppl (ACCU-CHEK GUIDE ME) w/Device KIT Use as directed. E11.65   BREZTRI  AEROSPHERE 160-9-4.8 MCG/ACT AERO INHALE 2 PUFFS TWICE DAILY   buPROPion  (WELLBUTRIN  XL) 150 MG 24 hr tablet Take 1 tablet (150 mg total) by mouth daily. Take along with 300 mg daily   buPROPion  (WELLBUTRIN  XL) 300 MG 24 hr tablet Take 1 tablet (300 mg total) by mouth daily. Take along with 150 mg daily   Continuous Glucose Sensor (DEXCOM G7 SENSOR) MISC Use one every 10 days for uncontrolled diabetes  E11.65   dapagliflozin  propanediol (FARXIGA ) 10 MG TABS tablet Take 1 tablet (10 mg total) by mouth daily before breakfast.   DULoxetine  (CYMBALTA ) 30 MG capsule Take 1 capsule (30 mg total) by mouth daily. Take along with 60 mg daily , total of 90 mg daily   DULoxetine  (CYMBALTA ) 60 MG capsule Take 1 capsule (60 mg total) by mouth daily. Take along with 30 mg daily   ezetimibe  (ZETIA ) 10 MG tablet Take 1 tablet (10 mg total) by mouth daily.   famotidine  (PEPCID ) 20 MG tablet Take 1 tablet by mouth twice daily   glucose blood (ACCU-CHEK GUIDE) test strip Use 1 test strip to check fasting glucose once daily and as need for diabetes   insulin  lispro (HUMALOG  KWIKPEN) 200 UNIT/ML KwikPen INJECT INSULIN  INTO THE SKIN WITH MEALS THREE TIMES DAILY PER SLIDING SCALE PROVIDED. MAX DAILY DOSE IS 42 UNITS   Insulin  Pen Needle 32G X 4 MM MISC Use with daily dose insulin  and with sliding scale insulin  as needed.   lidocaine  (LIDODERM ) 5 % Place 1 patch onto the skin daily. Remove & Discard patch within 12 hours or as directed by MD   Melatonin 10 MG CAPS Take 30 mg by mouth at bedtime.   meloxicam  (MOBIC ) 15 MG tablet Take 15 mg by mouth daily.   metoprolol  tartrate (LOPRESSOR ) 100 MG tablet Take 1 tablet by mouth twice daily   nitroGLYCERIN  (NITROSTAT ) 0.4 MG SL tablet Place 1 tablet (0.4 mg total) under the tongue  every 5 (five) minutes x 3 doses as needed for chest pain.   OZEMPIC , 1 MG/DOSE, 4 MG/3ML SOPN INJECT 1 DOSE SUBCUTANEOUSLY ONCE A WEEK   pantoprazole  (PROTONIX ) 40 MG tablet Take 1 tablet by mouth once daily   tamsulosin  (FLOMAX ) 0.4 MG CAPS capsule Take 1 capsule by mouth once daily   temazepam  (RESTORIL ) 30 MG capsule TAKE 1 CAPSULE BY MOUTH AT BEDTIME AS NEEDED FOR SLEEP   traMADol  (ULTRAM ) 50 MG tablet TAKE 1 TABLET BY MOUTH TWICE DAILY AS NEEDED FOR PAIN OF  RIGHT  RIB  CAGE   varenicline  (CHANTIX ) 1 MG tablet Take 1 tablet by mouth twice daily   [DISCONTINUED] insulin  glargine, 2 Unit  Dial , (TOUJEO  MAX SOLOSTAR) 300 UNIT/ML Solostar Pen Inject 50 Units into the skin daily.   [DISCONTINUED] pregabalin  (LYRICA ) 50 MG capsule Take 1 capsule (50 mg total) by mouth 3 (three) times daily. For 7 days then increase to 2 capsules PO 3 times daily   insulin  glargine, 2 Unit Dial , (TOUJEO  MAX SOLOSTAR) 300 UNIT/ML Solostar Pen Inject 50 Units into the skin daily.   pregabalin  (LYRICA ) 50 MG capsule Take 1 capsule (50 mg total) by mouth 3 (three) times daily. For 7 days then increase to 2 capsules PO 3 times daily   [DISCONTINUED] gabapentin  (NEURONTIN ) 300 MG capsule Take 1 capsule (300 mg total) by mouth 3 (three) times daily.   No facility-administered encounter medications on file as of 09/05/2023.    Surgical History: Past Surgical History:  Procedure Laterality Date    ingrown toenail removal     CHOLECYSTECTOMY N/A 01/23/2019   Procedure: LAPAROSCOPIC CHOLECYSTECTOMY;  Surgeon: Emmalene Hare, MD;  Location: [facility] ORS;  Service: General;  Laterality: N/A;   CLAVICLE SURGERY Right    COLONOSCOPY  2017   COLONOSCOPY WITH PROPOFOL  N/A 08/25/2020   Procedure: COLONOSCOPY WITH PROPOFOL ;  Surgeon: Irby Mannan, MD;  Location: [facility];  Service: Endoscopy;  Laterality: N/A;   ESOPHAGOGASTRODUODENOSCOPY (EGD) WITH PROPOFOL  N/A 08/25/2020   Procedure: ESOPHAGOGASTRODUODENOSCOPY (EGD) WITH PROPOFOL ;  Surgeon: Irby Mannan, MD;  Location: [facility];  Service: Endoscopy;  Laterality: N/A;   IR THORACENTESIS ASP PLEURAL SPACE W/IMG GUIDE  11/20/2022   LYMPH NODE DISSECTION Right 10/27/2022   Procedure: LYMPH NODE DISSECTION;  Surgeon: Zelphia Higashi, MD;  Location: [facility] OR;  Service: Thoracic;  Laterality: Right;   MOUTH SURGERY     REMOVED ALL TEETH   SHOULDER SURGERY Right    UPPER GI ENDOSCOPY  2017   XI ROBOTIC ASSISTED THORACOSCOPY- SEGMENTECTOMY Right 10/27/2022   Procedure: XI ROBOTIC ASSISTED THORACOSCOPY-RIGHT LOWER LOBE SUPERIOR SEGMENTECTOMY;  Surgeon:  Zelphia Higashi, MD;  Location: [facility] OR;  Service: Thoracic;  Laterality: Right;    Medical History: Past Medical History:  Diagnosis Date   Anxiety    Arthritis    NECK   Bronchitis    Coronary artery disease    Depression    Diabetes mellitus without complication (HCC)    Diastasis of rectus abdominis 06/19/2018   Dyspnea    DUE TO GALLBLADDER PER PT   Dysrhythmia    "fast hear rate at times. stopped when placed on metoprolol "   Emphysema lung (HCC)    Family history of adverse reaction to anesthesia    N/V, TROUBLE BREATHING COMING OUT OF ANESTHESIA   Fatty liver    GERD (gastroesophageal reflux disease)    Headache    MIGRAINES   Hyperlipidemia    Hypertension  Irregular heart beat unk   Myocardial infarction (HCC) 2010   MILD    PTSD (post-traumatic stress disorder)    Sleep apnea    USES CPAP   Tachycardia    Ventral hernia     Family History: Family History  Problem Relation Age of Onset   Hypertension Mother    Diabetes type II Mother    Diabetes Mother    COPD Father    Lung cancer Father    Heart disease Father    Diabetes Sister    Anxiety disorder Sister    Depression Sister    Diabetes Sister    Anxiety disorder Sister    Depression Sister    Diabetes Sister    Anxiety disorder Sister    Depression Sister    Diabetes Sister    Anxiety disorder Sister    Depression Sister    Diabetes Brother    Anxiety disorder Brother    Depression Brother    Diabetes Maternal Aunt    Colon cancer Maternal Uncle     Social History   Socioeconomic History   Marital status: Divorced    Spouse name: Not on file   Number of children: 4   Years of education: Not on file   Highest education level: 8th grade  Occupational History   Not on file  Tobacco Use   Smoking status: Every Day    Current packs/day: 0.25    Average packs/day: 0.3 packs/day for 40.0 years (10.0 ttl pk-yrs)    Types: Cigarettes   Smokeless tobacco: Former    Types: Chew    Tobacco comments:    2-3 cigarettes per day   Vaping Use   Vaping status: Former  Substance and Sexual Activity   Alcohol use: No    Alcohol/week: 0.0 standard drinks of alcohol   Drug use: No   Sexual activity: Not Currently  Other Topics Concern   Not on file  Social History Narrative   Not on file   Social Drivers of Health   Financial Resource Strain: Low Risk  (08/17/2020)   Overall Financial Resource Strain (CARDIA)    Difficulty of Paying Living Expenses: Not very hard  Food Insecurity: Food Insecurity Present (12/14/2022)   Hunger Vital Sign    Worried About Running Out of Food in the Last Year: Often true    Ran Out of Food in the Last Year: Often true  Transportation Needs: No Transportation Needs (12/14/2022)   PRAPARE - Administrator, Civil Service (Medical): No    Lack of Transportation (Non-Medical): No  Physical Activity: Inactive (05/23/2018)   Exercise Vital Sign    Days of Exercise per Week: 0 days    Minutes of Exercise per Session: 0 min  Stress: Stress Concern Present (05/23/2018)   [employer] of Occupational Health - Occupational Stress Questionnaire    Feeling of Stress : Very much  Social Connections: Unknown (05/23/2018)   Social Connection and Isolation Panel [NHANES]    Frequency of Communication with Friends and Family: Not on file    Frequency of Social Gatherings with Friends and Family: Not on file    Attends Religious Services: Never    Active Member of Clubs or Organizations: No    Attends Banker Meetings: Never    Marital Status: Divorced  Catering manager Violence: Not At Risk (12/14/2022)   Humiliation, Afraid, Rape, and Kick questionnaire    Fear of Current or Ex-Partner: No    Emotionally  Abused: No    Physically Abused: No    Sexually Abused: No      Review of Systems  Constitutional:  Negative for chills, fatigue and unexpected weight change.  HENT:  Negative for congestion, rhinorrhea, sneezing  and sore throat.   Eyes:  Negative for redness.  Respiratory: Negative.  Negative for cough, chest tightness, shortness of breath and wheezing.   Cardiovascular: Negative.  Negative for chest pain and palpitations.  Gastrointestinal:  Negative for abdominal pain, constipation, diarrhea, nausea and vomiting.  Genitourinary:  Negative for dysuria, flank pain and frequency.  Musculoskeletal:  Positive for arthralgias. Negative for back pain, joint swelling and neck pain.  Skin:  Negative for color change and rash.  Neurological: Negative.  Negative for tremors and numbness.       Neuropathy  Hematological:  Negative for adenopathy. Does not bruise/bleed easily.  Psychiatric/Behavioral:  Negative for behavioral problems (Depression), self-injury, sleep disturbance and suicidal ideas. The patient is not nervous/anxious.     Vital Signs: BP 114/80   Pulse 100   Temp 98.3 F (36.8 C)   Resp 16   Ht 5\' 9"  (1.753 m)   Wt 207 lb 6.4 oz (94.1 kg)   SpO2 97%   BMI 30.63 kg/m    Physical Exam Vitals reviewed.  Constitutional:      General: He is not in acute distress.    Appearance: Normal appearance. He is obese. He is not ill-appearing.  HENT:     Head: Normocephalic and atraumatic.  Eyes:     Pupils: Pupils are equal, round, and reactive to light.  Cardiovascular:     Rate and Rhythm: Normal rate and regular rhythm.  Pulmonary:     Effort: Pulmonary effort is normal. No respiratory distress.  Neurological:     Mental Status: He is alert and oriented to person, place, and time.  Psychiatric:        Mood and Affect: Mood normal.        Behavior: Behavior normal.        Assessment/Plan: 1. Type 2 diabetes mellitus with other specified complication, with long-term current use of insulin  (HCC) (Primary) Continue ozempic  and toujeo  and farxiga  as prescribed.  - insulin  glargine, 2 Unit Dial , (TOUJEO  MAX SOLOSTAR) 300 UNIT/ML Solostar Pen; Inject 50 Units into the skin daily.   Dispense: 15 mL; Refill: 3  2. Diabetic polyneuropathy associated with type 2 diabetes mellitus (HCC) Patient was not started on lyrica  yet, reports that he never got it from his pharmacy. He had previously tried gabapentin  but it was not effective so he is being switched to lyrica  for neuropathy.  - pregabalin  (LYRICA ) 50 MG capsule; Take 1 capsule (50 mg total) by mouth 3 (three) times daily. For 7 days then increase to 2 capsules PO 3 times daily  Dispense: 159 capsule; Refill: 0  3. Hypertension associated with diabetes (HCC) Continue metoprolol  as prescribed.   4. Centrilobular emphysema (HCC) Continue breztri  as prescribed   5. Aortic atherosclerosis (HCC) Continue statin therapy as prescribed.   6. Chronic diastolic heart failure (HCC) Continue farxiga  as prescribed   7. Obesity, morbid (HCC) Lost a few lbs since last office visit   8. MDD (major depressive disorder), recurrent episode, moderate (HCC) Sees Dr. Tere Felts and is currently on bupropion , duloxetine  and temazepam     General Counseling: Malikah verbalizes understanding of the findings of todays visit and agrees with plan of treatment. I have discussed any further diagnostic evaluation that may be needed  or ordered today. We also reviewed his medications today. he has been encouraged to call the office with any questions or concerns that should arise related to todays visit.    No orders of the defined types were placed in this encounter.   Meds ordered this encounter  Medications   insulin  glargine, 2 Unit Dial , (TOUJEO  MAX SOLOSTAR) 300 UNIT/ML Solostar Pen    Sig: Inject 50 Units into the skin daily.    Dispense:  15 mL    Refill:  3   pregabalin  (LYRICA ) 50 MG capsule    Sig: Take 1 capsule (50 mg total) by mouth 3 (three) times daily. For 7 days then increase to 2 capsules PO 3 times daily    Dispense:  159 capsule    Refill:  0    Initial prescription, will send in new script for 100 mg capsule for next  fill. PLEASE SEND PRIOR AUTH REQUEST VIA FAX [phone] or via [email or web address]    Return for previously scheduled, AWV, Alejos Reinhardt PCP in july.   Total time spent:30 Minutes Time spent includes review of chart, medications, test results, and follow up plan with the patient.   [place] Controlled Substance Database was reviewed by me.  This patient was seen by Laurence Pons, FNP-C in collaboration with Dr. Verneta Gone as a part of collaborative care agreement.   Sinjin Amero R. Bobbi Burow, MSN, FNP-C Internal medicine

## 2023-09-07 ENCOUNTER — Encounter: Payer: Self-pay | Admitting: Nurse Practitioner

## 2023-09-07 MED ORDER — PREGABALIN 50 MG PO CAPS: 50.0000 mg | ORAL_CAPSULE | Freq: Three times a day (TID) | ORAL | 0 refills | Status: AC

## 2023-09-07 NOTE — Progress Notes (Signed)
   09/07/2023  Patient ID: Raymond [name], male   DOB: 04/19/1966, 58 y.o.   MRN: [id number]  Patient was identified on the diabetes true north metric measure for uncontrolled diabetes defined as A1C greater than 8%. Patient's recent hemoglobin A1C 6.7% (improved from 9%). As such, will follow patient peripherally as there were no noted medication assistance needs or adherence concerns noted by PCP.  Last PCP appointment on 09/05/2023. Pharmacist follow up 4-6 weeks after last office visit with PCP.   Upcoming appointment with PCP on 10/24/2023.   Alexandria Angel, PharmD Clinical Pharmacist Cell: [phone]

## 2023-09-13 ENCOUNTER — Encounter: Payer: Self-pay | Admitting: Nurse Practitioner

## 2023-09-14 ENCOUNTER — Encounter: Payer: Self-pay | Admitting: Psychiatry

## 2023-09-14 ENCOUNTER — Telehealth (INDEPENDENT_AMBULATORY_CARE_PROVIDER_SITE_OTHER): Payer: 59 | Admitting: Psychiatry

## 2023-09-14 ENCOUNTER — Encounter: Payer: Self-pay | Admitting: Nurse Practitioner

## 2023-09-14 DIAGNOSIS — F17210 Nicotine dependence, cigarettes, uncomplicated: Secondary | ICD-10-CM

## 2023-09-14 DIAGNOSIS — F418 Other specified anxiety disorders: Secondary | ICD-10-CM | POA: Diagnosis not present

## 2023-09-14 DIAGNOSIS — F3342 Major depressive disorder, recurrent, in full remission: Secondary | ICD-10-CM | POA: Diagnosis not present

## 2023-09-14 DIAGNOSIS — F4310 Post-traumatic stress disorder, unspecified: Secondary | ICD-10-CM

## 2023-09-14 DIAGNOSIS — F17200 Nicotine dependence, unspecified, uncomplicated: Secondary | ICD-10-CM

## 2023-09-14 DIAGNOSIS — G4701 Insomnia due to medical condition: Secondary | ICD-10-CM | POA: Diagnosis not present

## 2023-09-14 MED ORDER — TEMAZEPAM 30 MG PO CAPS: 30.0000 mg | ORAL_CAPSULE | Freq: Every evening | ORAL | 5 refills | Status: DC | PRN

## 2023-09-14 MED ORDER — ROPINIROLE HCL 0.25 MG PO TABS: 0.2500 mg | ORAL_TABLET | Freq: Three times a day (TID) | ORAL | 1 refills | Status: DC

## 2023-09-14 NOTE — Progress Notes (Signed)
 Virtual Visit via Video Note  I connected with Raymond [name] on 09/14/23 at 11:40 AM EDT by a video enabled telemedicine application and verified that I am speaking with the correct person using two identifiers.  Location Provider Location : [facility] Patient Location : Home  Participants: Patient ,Brother, Provider   I discussed the limitations of evaluation and management by telemedicine and the availability of in person appointments. The patient expressed understanding and agreed to proceed.   I discussed the assessment and treatment plan with the patient. The patient was provided an opportunity to ask questions and all were answered. The patient agreed with the plan and demonstrated an understanding of the instructions.   The patient was advised to call back or seek an in-person evaluation if the symptoms worsen or if the condition fails to improve as anticipated.   BH MD OP Progress Note  09/14/2023 1:25 PM BYARD CARRANZA  MRN:  [id number]  Chief Complaint:  Chief Complaint  Patient presents with   Follow-up   Anxiety   Depression   Medication Refill   Discussed the use of AI scribe software for clinical note transcription with the patient, who gave verbal consent to proceed.  History of Present Illness Raymond [name] is a 58 year old Caucasian male, lives in [place], divorced, has a history of MDD, PTSD, insomnia, anxiety, tobacco use disorder, hyperlipidemia, diabetes mellitus, hepatic steatosis, lung cancer with right lower lobe superior segmentectomy-10/27/2022 was evaluated by telemedicine today.He is accompanied by his brother.  He experiences ongoing sleep disturbances characterized by frequent awakenings throughout the night. He wakes up multiple times and has difficulty turning over without waking, although he is able to fall back asleep quickly. Some awakenings are attributed to involuntary limb movements, previously noted during a sleep apnea test. He uses a  CPAP machine nightly, which he believes helps, but the sleep issues persist. He is currently taking temazepam  for sleep.  He has a history of major depression, currently in remission, and anxiety disorder. No current symptoms of depression or anxiety are present, and he reports no thoughts of self-harm or harm to others. He is taking cymbalta  and bupropion  for these conditions, with no recent changes in dosage.  He underwent lung surgery in the past and continues to experience some residual pain, although it has improved since his last visit. The pain occasionally affects his sleep but is not the primary cause of his awakenings.  He has a history of tobacco use and is currently taking Chantix , which he recently refilled through his primary care doctor. No significant changes in appetite, weight, memory, or focus since his last visit.  Denies any other concerns today.     Visit Diagnosis:    ICD-10-CM   1. MDD (major depressive disorder), recurrent, in full remission (HCC)  F33.42 temazepam  (RESTORIL ) 30 MG capsule    2. PTSD (post-traumatic stress disorder)  F43.10 temazepam  (RESTORIL ) 30 MG capsule    3. Insomnia due to medical condition  G47.01 rOPINIRole (REQUIP) 0.25 MG tablet   Pain,OSA, Periodic limb movements    4. Other specified anxiety disorders  F41.8    Generalized anxiety not occurring more days than not    5. Tobacco use disorder  F17.200       Past Psychiatric History: I have reviewed past psychiatric history from progress note on 05/23/2018.  Past trials of Tegretol , doxepin, Seroquel , mirtazapine , Belsomra , Lunesta, Ativan , risperidone, Lexapro .  Past Medical History:  Past Medical History:  Diagnosis Date  Anxiety    Arthritis    NECK   Bronchitis    Coronary artery disease    Depression    Diabetes mellitus without complication (HCC)    Diastasis of rectus abdominis 06/19/2018   Dyspnea    DUE TO GALLBLADDER PER PT   Dysrhythmia    "fast hear rate at  times. stopped when placed on metoprolol "   Emphysema lung (HCC)    Family history of adverse reaction to anesthesia    N/V, TROUBLE BREATHING COMING OUT OF ANESTHESIA   Fatty liver    GERD (gastroesophageal reflux disease)    Headache    MIGRAINES   Hyperlipidemia    Hypertension    Irregular heart beat unk   Myocardial infarction (HCC) 2010   MILD    PTSD (post-traumatic stress disorder)    Sleep apnea    USES CPAP   Tachycardia    Ventral hernia     Past Surgical History:  Procedure Laterality Date    ingrown toenail removal     CHOLECYSTECTOMY N/A 01/23/2019   Procedure: LAPAROSCOPIC CHOLECYSTECTOMY;  Surgeon: Emmalene Hare, MD;  Location: [facility] OR[facility];  Service: General;  Laterality: N/A;   CLAVICLE SURGERY Right    COLONOSCOPY  2017   COLONOSCOPY WITH PROPOFOL  N/A 08/25/2020   Procedure: COLONOSCOPY WITH PROPOFOL ;  Surgeon: Irby Mannan, MD;  Location: [facility];  Service: Endoscopy;  Laterality: N/A;   ESOPHAGOGASTRODUODENOSCOPY (EGD) WITH PROPOFOL  N/A 08/25/2020   Procedure: ESOPHAGOGASTRODUODENOSCOPY (EGD) WITH PROPOFOL ;  Surgeon: Irby Mannan, MD;  Location: [facility] END[facility];  Service: Endoscopy;  Laterality: N/A;   IR THORACENTESIS ASP PLEURAL SPACE W/IMG GUIDE  11/20/2022   LYMPH NODE DISSECTION Right 10/27/2022   Procedure: LYMPH NODE DISSECTION;  Surgeon: Zelphia Higashi, MD;  Location: [facility];  Service: Thoracic;  Laterality: Right;   MOUTH SURGERY     REMOVED ALL TEETH   SHOULDER SURGERY Right    UPPER GI ENDOSCOPY  2017   XI ROBOTIC ASSISTED THORACOSCOPY- SEGMENTECTOMY Right 10/27/2022   Procedure: XI ROBOTIC ASSISTED THORACOSCOPY-RIGHT LOWER LOBE SUPERIOR SEGMENTECTOMY;  Surgeon: Zelphia Higashi, MD;  Location: [facility] OR;  Service: Thoracic;  Laterality: Right;    Family Psychiatric History: I have reviewed family psychiatric history from progress note on 05/23/2018.  Family History:  Family History  Problem Relation Age of Onset    Hypertension Mother    Diabetes type II Mother    Diabetes Mother    COPD Father    Lung cancer Father    Heart disease Father    Diabetes Sister    Anxiety disorder Sister    Depression Sister    Diabetes Sister    Anxiety disorder Sister    Depression Sister    Diabetes Sister    Anxiety disorder Sister    Depression Sister    Diabetes Sister    Anxiety disorder Sister    Depression Sister    Diabetes Brother    Anxiety disorder Brother    Depression Brother    Diabetes Maternal Aunt    Colon cancer Maternal Uncle     Social History: I have reviewed social history from progress note on 05/23/2018. Social History   Socioeconomic History   Marital status: Divorced    Spouse name: Not on file   Number of children: 4   Years of education: Not on file   Highest education level: 8th grade  Occupational History   Not on file  Tobacco Use   Smoking status:  Every Day    Current packs/day: 0.25    Average packs/day: 0.3 packs/day for 40.0 years (10.0 ttl pk-yrs)    Types: Cigarettes   Smokeless tobacco: Former    Types: Chew   Tobacco comments:    2-3 cigarettes per day   Vaping Use   Vaping status: Former  Substance and Sexual Activity   Alcohol use: No    Alcohol/week: 0.0 standard drinks of alcohol   Drug use: No   Sexual activity: Not Currently  Other Topics Concern   Not on file  Social History Narrative   Not on file   Social Drivers of Health   Financial Resource Strain: Low Risk  (08/17/2020)   Overall Financial Resource Strain (CARDIA)    Difficulty of Paying Living Expenses: Not very hard  Food Insecurity: Food Insecurity Present (12/14/2022)   Hunger Vital Sign    Worried About Running Out of Food in the Last Year: Often true    Ran Out of Food in the Last Year: Often true  Transportation Needs: No Transportation Needs (12/14/2022)   PRAPARE - Administrator, Civil Service (Medical): No    Lack of Transportation (Non-Medical): No   Physical Activity: Inactive (05/23/2018)   Exercise Vital Sign    Days of Exercise per Week: 0 days    Minutes of Exercise per Session: 0 min  Stress: Stress Concern Present (05/23/2018)   [employer] of Occupational Health - Occupational Stress Questionnaire    Feeling of Stress : Very much  Social Connections: Unknown (05/23/2018)   Social Connection and Isolation Panel [NHANES]    Frequency of Communication with Friends and Family: Not on file    Frequency of Social Gatherings with Friends and Family: Not on file    Attends Religious Services: Never    Active Member of Clubs or Organizations: No    Attends Banker Meetings: Never    Marital Status: Divorced    Allergies:  Allergies  Allergen Reactions   Onion Anaphylaxis   Norco [Hydrocodone-Acetaminophen ] Itching   Nickel Rash   Nicoderm [Nicotine ] Rash    Metabolic Disorder Labs: Lab Results  Component Value Date   HGBA1C 6.7 (A) 08/08/2023   MPG 205.86 10/25/2022   MPG 294.83 05/31/2017   No results found for: "PROLACTIN" Lab Results  Component Value Date   CHOL 151 11/11/2021   TRIG 251 (H) 11/11/2021   HDL 34 (L) 11/11/2021   CHOLHDL 4.4 11/11/2021   VLDL 26 12/09/2012   LDLCALC 76 11/11/2021   LDLCALC 153 (H) 08/17/2020   Lab Results  Component Value Date   TSH 1.890 08/17/2020   TSH 4.120 06/10/2018    Therapeutic Level Labs: No results found for: "LITHIUM" No results found for: "VALPROATE" No results found for: "CBMZ"  Current Medications: Current Outpatient Medications  Medication Sig Dispense Refill   rOPINIRole (REQUIP) 0.25 MG tablet Take 1 tablet (0.25 mg total) by mouth 3 (three) times daily. 90 tablet 1   Accu-Chek Softclix Lancets lancets Use 1 lancet to check glucose once daily and as needed for diabetes 100 each 12   acetaminophen  (TYLENOL ) 500 MG tablet Take 2 tablets (1,000 mg total) by mouth 4 (four) times daily. 30 tablet 0   albuterol  (VENTOLIN  HFA) 108 (90 Base)  MCG/ACT inhaler INHALE 2 PUFFS INTO THE LUNGS EVERY 6 HOURS AS MEEDED FOR WHEEZING OR SHORTNESS OF BREATH 18 g 1   aspirin  EC 81 MG tablet Take 81 mg by mouth daily  at 3 pm.      atorvastatin  (LIPITOR) 40 MG tablet Take 1 tablet (40 mg total) by mouth daily. 90 tablet 3   Blood Glucose Monitoring Suppl (ACCU-CHEK GUIDE ME) w/Device KIT Use as directed. E11.65 1 kit 0   BREZTRI  AEROSPHERE 160-9-4.8 MCG/ACT AERO INHALE 2 PUFFS TWICE DAILY 11 g 0   buPROPion  (WELLBUTRIN  XL) 150 MG 24 hr tablet Take 1 tablet (150 mg total) by mouth daily. Take along with 300 mg daily 90 tablet 3   buPROPion  (WELLBUTRIN  XL) 300 MG 24 hr tablet Take 1 tablet (300 mg total) by mouth daily. Take along with 150 mg daily 90 tablet 3   Continuous Glucose Sensor (DEXCOM G7 SENSOR) MISC Use one every 10 days for uncontrolled diabetes E11.65 3 each 11   dapagliflozin  propanediol (FARXIGA ) 10 MG TABS tablet Take 1 tablet (10 mg total) by mouth daily before breakfast. 90 tablet 2   DULoxetine  (CYMBALTA ) 30 MG capsule Take 1 capsule (30 mg total) by mouth daily. Take along with 60 mg daily , total of 90 mg daily 90 capsule 3   DULoxetine  (CYMBALTA ) 60 MG capsule Take 1 capsule (60 mg total) by mouth daily. Take along with 30 mg daily 90 capsule 3   ezetimibe  (ZETIA ) 10 MG tablet Take 1 tablet (10 mg total) by mouth daily. 90 tablet 3   famotidine  (PEPCID ) 20 MG tablet Take 1 tablet by mouth twice daily 180 tablet 0   glucose blood (ACCU-CHEK GUIDE) test strip Use 1 test strip to check fasting glucose once daily and as need for diabetes 100 each 12   insulin  glargine, 2 Unit Dial , (TOUJEO  MAX SOLOSTAR) 300 UNIT/ML Solostar Pen Inject 50 Units into the skin daily. 15 mL 3   insulin  lispro (HUMALOG  KWIKPEN) 200 UNIT/ML KwikPen INJECT INSULIN  INTO THE SKIN WITH MEALS THREE TIMES DAILY PER SLIDING SCALE PROVIDED. MAX DAILY DOSE IS 42 UNITS 6 mL 3   Insulin  Pen Needle 32G X 4 MM MISC Use with daily dose insulin  and with sliding scale  insulin  as needed. 100 each 5   lidocaine  (LIDODERM ) 5 % Place 1 patch onto the skin daily. Remove & Discard patch within 12 hours or as directed by MD 30 patch 0   Melatonin 10 MG CAPS Take 30 mg by mouth at bedtime.     meloxicam  (MOBIC ) 15 MG tablet Take 15 mg by mouth daily.     metoprolol  tartrate (LOPRESSOR ) 100 MG tablet Take 1 tablet by mouth twice daily 180 tablet 0   nitroGLYCERIN  (NITROSTAT ) 0.4 MG SL tablet Place 1 tablet (0.4 mg total) under the tongue every 5 (five) minutes x 3 doses as needed for chest pain. 30 tablet 1   OZEMPIC , 1 MG/DOSE, 4 MG/3ML SOPN INJECT 1 DOSE SUBCUTANEOUSLY ONCE A WEEK 9 mL 0   pantoprazole  (PROTONIX ) 40 MG tablet Take 1 tablet by mouth once daily 90 tablet 0   pregabalin  (LYRICA ) 50 MG capsule Take 1 capsule (50 mg total) by mouth 3 (three) times daily. For 7 days then increase to 2 capsules PO 3 times daily 159 capsule 0   tamsulosin  (FLOMAX ) 0.4 MG CAPS capsule Take 1 capsule by mouth once daily 90 capsule 0   temazepam  (RESTORIL ) 30 MG capsule Take 1 capsule (30 mg total) by mouth at bedtime as needed for sleep. 30 capsule 5   traMADol  (ULTRAM ) 50 MG tablet TAKE 1 TABLET BY MOUTH TWICE DAILY AS NEEDED FOR PAIN OF  RIGHT  RIB  CAGE 60 tablet 0   varenicline  (CHANTIX ) 1 MG tablet Take 1 tablet by mouth twice daily 60 tablet 2   No current facility-administered medications for this visit.     Musculoskeletal: Strength & Muscle Tone: UTA Gait & Station: Seated Patient leans: N/A  Psychiatric Specialty Exam: Review of Systems  Psychiatric/Behavioral:  Positive for sleep disturbance.     There were no vitals taken for this visit.There is no height or weight on file to calculate BMI.  General Appearance: Casual  Eye Contact:  Fair  Speech:  Clear and Coherent  Volume:  Normal  Mood:  Euthymic  Affect:  Congruent  Thought Process:  Goal Directed and Descriptions of Associations: Intact  Orientation:  Full (Time, Place, and Person)  Thought  Content: Logical   Suicidal Thoughts:  No  Homicidal Thoughts:  No  Memory:  Immediate;   Fair Recent;   Fair Remote;   Fair  Judgement:  Fair  Insight:  Fair  Psychomotor Activity:  Normal  Concentration:  Concentration: Fair and Attention Span: Fair  Recall:  [employer] of Knowledge: Fair  Language: Fair  Akathisia:  No  Handed:  Right  AIMS (if indicated): not done  Assets:  Communication Skills Desire for Improvement Housing Social Support  ADL's:  Intact  Cognition: WNL  Sleep:  Poor   Screenings: GAD-7    Loss adjuster, chartered Office Visit from 02/22/2023 in [facility] Office Visit from 02/09/2022 in [facility] Video Visit from 12/20/2021 in [facility] Video Visit from 06/20/2021 in [facility] Office Visit from 11/22/2020 in [facility]  Total GAD-7 Score 5 7 6 8 11       Mini-Mental    Flowsheet Row Office Visit from 10/23/2022 in [facility], [facility] Office Visit from 10/19/2021 in [facility], [facility] Clinical Support from 10/18/2020 in [facility], [facility] Clinical Support from 10/17/2019 in [facility], [facility] Clinical Support from 10/08/2018 in [facility], [facility]  Total Score (max 30 points ) 30 30 29 30 27       PHQ2-9    Flowsheet Row Office Visit from 02/22/2023 in [facility] Office Visit from 12/14/2022 in [facility] [place] Office Visit from 10/23/2022 in [facility], [facility] Office Visit from 02/09/2022 in [facility] Video Visit from 12/20/2021 in [facility]  PHQ-2 Total Score 2 1 1 2 1   PHQ-9 Total Score 7 -- -- 8 --      Flowsheet Row  Video Visit from 09/14/2023 in [facility] ED from 07/05/2023 in [facility] Emergency Department at [facility] Video Visit from 05/29/2023 in [facility]  C-SSRS RISK CATEGORY Moderate Risk No Risk Moderate Risk        Assessment and Plan: Raymond [name] is a 58 year old Caucasian male, lives in [place], has a history of MDD, insomnia, adenocarcinoma of right lung status post lung segmentectomy was evaluated by telemedicine today.  Discussed assessment and plan as noted below.  Major depression in remission Other specified anxiety disorder-generalized anxiety not occurring more days than not-stable Posttraumatic stress disorder-stable Currently denies any significant mood symptoms or trauma related symptoms.  Currently managed on medications as prescribed. - Continue Cymbalta   90 mg daily - Continue Wellbutrin  XL 450 mg daily - Continue Hydroxyzine  25-50 mg at bedtime as needed  Insomnia-unstable Sleep problems likely a combination of pain,periodic involuntary movements, sleep apnea.  Currently compliant on CPAP.  Agreeable to trial of medication to manage restless leg symptoms. - Continue CPAP for OSA-sleep study-01/05/2021-Periodic limb movement. - Continue Temazepam  30 mg at bedtime - Start Requip 0.25 mg 3 times a day. - Reviewed [place] PMP AWARxE  Tobacco use disorder-improving Continues to smoke cigarettes although cutting back.  Currently on Chantix . - Continue Chantix  1 mg twice daily.  Follow-up Follow-up in clinic in 6 to 7 weeks or sooner in person.    Consent: Patient/Guardian gives verbal consent for treatment and assignment of benefits for services provided during this visit. Patient/Guardian expressed understanding and agreed to proceed.  This note was generated in part or whole with voice recognition software. Voice recognition is usually quite accurate but there are transcription  errors that can and very often do occur. I apologize for any typographical errors that were not detected and corrected.     Izella Ybanez, MD 09/14/2023, 1:25 PM

## 2023-09-14 NOTE — Patient Instructions (Signed)
 Ropinirole Tablets What is this medication? ROPINIROLE (roe PIN i role) treats the symptoms of Parkinson disease. It works by acting like dopamine, a substance in your body that helps manage movements and coordination. This reduces the symptoms of Parkinson, such as body stiffness and tremors. It may also be used to treat restless legs syndrome (RLS). This medicine may be used for other purposes; ask your health care provider or pharmacist if you have questions. COMMON BRAND NAME(S): Requip What should I tell my care team before I take this medication? They need to know if you have any of these conditions: Heart disease High blood pressure Kidney disease Liver disease Low blood pressure Narcolepsy Sleep apnea Tobacco use An unusual or allergic reaction to ropinirole, other medications, foods, dyes, or preservatives Pregnant or trying to get pregnant Breast-feeding How should I use this medication? Take this medication by mouth with water. Take it as directed on the prescription label. You can take it with or without food. If it upsets your stomach, take it with food. If it upsets your stomach, take it with food. Keep taking this medication unless your care team tells you to stop. Stopping it too quickly can cause serious side effects. It can also make your condition worse. Talk to your care team about the use of this medication in children. Special care may be needed. Overdosage: If you think you have taken too much of this medicine contact a poison control center or emergency room at once. NOTE: This medicine is only for you. Do not share this medicine with others. What if I miss a dose? If you miss a dose, take it as soon as you can. If it is almost time for your next dose, take only that dose. Do not take double or extra doses. What may interact with this medication? Alcohol Antihistamines for allergy, cough and cold Certain medications for depression, anxiety, or mental health  conditions Certain medications for seizures, such as phenobarbital, primidone Certain medications for sleep Ciprofloxacin Estrogen or progestin hormones Fluvoxamine General anesthetics, such as halothane, isoflurane, methoxyflurane, propofol Medications for blood pressure Medications that relax muscles for surgery Metoclopramide Opioid medications for pain Rifampin Tobacco This list may not describe all possible interactions. Give your health care provider a list of all the medicines, herbs, non-prescription drugs, or dietary supplements you use. Also tell them if you smoke, drink alcohol, or use illegal drugs. Some items may interact with your medicine. What should I watch for while using this medication? Visit your care team for regular checks on your progress. Tell your care team if your symptoms do not start to get better or if they get worse. Do not suddenly stop taking this medication. You may develop a severe reaction. Your care team will tell you how much medication to take. If your care team wants you to stop the medication, the dose may be slowly lowered over time to avoid any side effects. This medication may affect your coordination, reaction time, or judgement. Do not drive or operate machinery until you know how this medication affects you. Sit up or stand slowly to reduce the risk of dizzy or fainting spells. Drinking alcohol with this medication can increase the risk of these side effects. When taking this medication, you may fall asleep without notice. You may be doing activities, such as driving a car, talking, or eating. You may not feel drowsy before it happens. Contact your care team right away if this happens to you. There have been reports  of increased sexual urges or other strong urges, such as gambling while taking this medication. If you experience any of these while taking this medication, you should report this to your care team as soon as possible. Your mouth may get  dry. Chewing sugarless gum or sucking hard candy and drinking plenty of water may help. Contact your care team if the problem does not go away or is severe. What side effects may I notice from receiving this medication? Side effects that you should report to your care team as soon as possible: Allergic reactions--skin rash, itching, hives, swelling of the face, lips, tongue, or throat Falling asleep during daily activities Low blood pressure--dizziness, feeling faint or lightheaded, blurry vision Mood and behavior changes--anxiety, nervousness, irritability and restlessness, confusion, hallucinations, feeling distrust or suspicion of others New or worsening uncontrolled and repetitive movements of the face, mouth, or upper body Slow heartbeat--dizziness, feeling faint or lightheaded, confusion, trouble breathing, unusual weakness or fatigue Urges to engage in impulsive behaviors such as gambling, binge eating, sexual activity, or shopping in ways that are unusual for you Side effects that usually do not require medical attention (report to your care team if they continue or are bothersome): Dizziness Drowsiness Nausea Swelling of the ankles, hands, or feet Unusual weakness or fatigue Upset stomach Vomiting This list may not describe all possible side effects. Call your doctor for medical advice about side effects. You may report side effects to FDA at 1-800-FDA-1088. Where should I keep my medication? Keep out of the reach of children and pets. Store at room temperature between 20 and 25 degrees C (68 and 77 degrees F). Protect from light and moisture. Keep the container tightly closed. Get rid of any unused medication after the expiration date. To get rid of medications that are no longer needed or have expired: Take the medication to a take-back program. Check with your pharmacy or law enforcement to find a location. If you cannot return the medication, check the label or package insert to  see if the medication should be thrown out in the garbage or flushed down the toilet. If you are not sure, ask your care team. If it is safe to put it in the trash, empty the medication out of the container. Mix the medication with cat litter, dirt, coffee grounds, or other unwanted substance. Seal the mixture in a bag or container. Put it in the trash. NOTE: This sheet is a summary. It may not cover all possible information. If you have questions about this medicine, talk to your doctor, pharmacist, or health care provider.  2024 [employer]vier/Gold Standard (2021-07-20 00:00:00)

## 2023-09-15 ENCOUNTER — Other Ambulatory Visit: Payer: Self-pay

## 2023-09-15 ENCOUNTER — Emergency Department
Admission: EM | Admit: 2023-09-15 | Discharge: 2023-09-15 | Disposition: A | Discharge status: Home / Self Care | Attending: Emergency Medicine | Admitting: Emergency Medicine

## 2023-09-15 ENCOUNTER — Emergency Department

## 2023-09-15 DIAGNOSIS — R911 Solitary pulmonary nodule: Secondary | ICD-10-CM | POA: Insufficient documentation

## 2023-09-15 DIAGNOSIS — R109 Unspecified abdominal pain: Secondary | ICD-10-CM

## 2023-09-15 DIAGNOSIS — N281 Cyst of kidney, acquired: Secondary | ICD-10-CM | POA: Diagnosis not present

## 2023-09-15 DIAGNOSIS — I2510 Atherosclerotic heart disease of native coronary artery without angina pectoris: Secondary | ICD-10-CM | POA: Insufficient documentation

## 2023-09-15 DIAGNOSIS — R0789 Other chest pain: Secondary | ICD-10-CM | POA: Diagnosis not present

## 2023-09-15 DIAGNOSIS — I700 Atherosclerosis of aorta: Secondary | ICD-10-CM | POA: Diagnosis not present

## 2023-09-15 DIAGNOSIS — R079 Chest pain, unspecified: Secondary | ICD-10-CM | POA: Diagnosis not present

## 2023-09-15 DIAGNOSIS — J449 Chronic obstructive pulmonary disease, unspecified: Secondary | ICD-10-CM | POA: Diagnosis not present

## 2023-09-15 DIAGNOSIS — E119 Type 2 diabetes mellitus without complications: Secondary | ICD-10-CM | POA: Diagnosis not present

## 2023-09-15 DIAGNOSIS — Z85118 Personal history of other malignant neoplasm of bronchus and lung: Secondary | ICD-10-CM | POA: Insufficient documentation

## 2023-09-15 DIAGNOSIS — K760 Fatty (change of) liver, not elsewhere classified: Secondary | ICD-10-CM | POA: Insufficient documentation

## 2023-09-15 DIAGNOSIS — R1011 Right upper quadrant pain: Secondary | ICD-10-CM | POA: Diagnosis not present

## 2023-09-15 DIAGNOSIS — J439 Emphysema, unspecified: Secondary | ICD-10-CM | POA: Diagnosis not present

## 2023-09-15 DIAGNOSIS — K429 Umbilical hernia without obstruction or gangrene: Secondary | ICD-10-CM | POA: Diagnosis not present

## 2023-09-15 DIAGNOSIS — K4020 Bilateral inguinal hernia, without obstruction or gangrene, not specified as recurrent: Secondary | ICD-10-CM | POA: Diagnosis not present

## 2023-09-15 HISTORY — DX: Malignant (primary) neoplasm, unspecified: C80.1

## 2023-09-15 LAB — URINALYSIS, W/ REFLEX TO CULTURE (INFECTION SUSPECTED)
Bacteria, UA: NONE SEEN
Bilirubin Urine: NEGATIVE
Glucose, UA: >=500 mg/dL — AB
Hgb urine dipstick: NEGATIVE
Ketones, ur: NEGATIVE mg/dL
Leukocytes,Ua: NEGATIVE
Nitrite: NEGATIVE
Protein, ur: NEGATIVE mg/dL
Specific Gravity, Urine: 1.033 — ABNORMAL HIGH (ref 1.005–1.030)
Squamous Epithelial / HPF: 0 /HPF (ref 0–5)
pH: 5 (ref 5.0–8.0)

## 2023-09-15 LAB — COMPREHENSIVE METABOLIC PANEL WITH GFR
ALT: 17 U/L (ref 0–44)
AST: 22 U/L (ref 15–41)
Albumin: 4.2 g/dL (ref 3.5–5.0)
Alkaline Phosphatase: 100 U/L (ref 38–126)
Anion gap: 12 (ref 5–15)
BUN: 13 mg/dL (ref 6–20)
CO2: 24 mmol/L (ref 22–32)
Calcium: 9.3 mg/dL (ref 8.9–10.3)
Chloride: 104 mmol/L (ref 98–111)
Creatinine, Ser: 1.21 mg/dL (ref 0.61–1.24)
GFR, Estimated: >60 mL/min (ref >60)
Glucose, Bld: 126 mg/dL — ABNORMAL HIGH (ref 70–99)
Potassium: 3.6 mmol/L (ref 3.5–5.1)
Sodium: 140 mmol/L (ref 135–145)
Total Bilirubin: 0.4 mg/dL (ref 0.0–1.2)
Total Protein: 7.8 g/dL (ref 6.5–8.1)

## 2023-09-15 LAB — CBC
HCT: 44.8 % (ref 39.0–52.0)
Hemoglobin: 15.3 g/dL (ref 13.0–17.0)
MCH: 30.2 pg (ref 26.0–34.0)
MCHC: 34.2 g/dL (ref 30.0–36.0)
MCV: 88.4 fL (ref 80.0–100.0)
Platelets: 211 10*3/uL (ref 150–400)
RBC: 5.07 MIL/uL (ref 4.22–5.81)
RDW: 12.4 % (ref 11.5–15.5)
WBC: 8.1 10*3/uL (ref 4.0–10.5)
nRBC: 0 % (ref 0.0–0.2)

## 2023-09-15 LAB — TROPONIN I (HIGH SENSITIVITY)
Troponin I (High Sensitivity): 6 ng/L (ref <18)
Troponin I (High Sensitivity): 6 ng/L (ref <18)

## 2023-09-15 LAB — LIPASE, BLOOD: Lipase: 35 U/L (ref 11–51)

## 2023-09-15 MED ORDER — IOHEXOL 350 MG/ML SOLN
100.0000 mL | Freq: Once | INTRAVENOUS | Status: AC | PRN
  Administered 2023-09-15: 100 mL via INTRAVENOUS

## 2023-09-15 MED ORDER — MORPHINE SULFATE (PF) 4 MG/ML IV SOLN
4.0000 mg | Freq: Once | INTRAVENOUS | Status: AC
  Administered 2023-09-15: 4 mg via INTRAVENOUS
  Filled 2023-09-15: qty 1

## 2023-09-15 MED ORDER — LIDOCAINE 5 % EX PTCH
1.0000 | MEDICATED_PATCH | CUTANEOUS | Status: DC
  Administered 2023-09-15: 1 via TRANSDERMAL
  Filled 2023-09-15: qty 1

## 2023-09-15 MED ORDER — OXYCODONE HCL 5 MG PO TABS: 5.0000 mg | ORAL_TABLET | Freq: Four times a day (QID) | ORAL | 0 refills | Status: DC | PRN

## 2023-09-15 MED ORDER — OXYCODONE HCL 5 MG PO TABS
5.0000 mg | ORAL_TABLET | Freq: Once | ORAL | Status: AC
  Administered 2023-09-15: 5 mg via ORAL
  Filled 2023-09-15: qty 1

## 2023-09-15 MED ORDER — LIDOCAINE 5 % EX PTCH: 1.0000 | MEDICATED_PATCH | CUTANEOUS | 0 refills | Status: DC

## 2023-09-15 NOTE — Discharge Instructions (Signed)
 Please take 650 mg of Tylenol  every 6 hours as needed for pain, also use the Lidoderm  patches daily.  Please reserve the oxycodone  for severe breakthrough pain.  Please do not drive or operate heavy machinery when you are on the oxycodone .  Please follow-up with your primary care doctor for further assessment of your symptoms as well as incidental findings noted in the workup today.

## 2023-09-15 NOTE — ED Triage Notes (Signed)
 Pt to ED for R side pain since 2 hours ago. Only on side, no pain to back or front. Denies urinary symptoms. States only hurts when moves. Hx lung cancer with lobectomy to lower R lung.

## 2023-09-15 NOTE — ED Provider Notes (Signed)
 [facility] Provider Note    Event Date/Time   First MD Initiated Contact with Patient 09/15/23 1716     (approximate)   History   side pain   HPI  Raymond [name] is a 58 y.o. male with history of lung cancer status post lobectomy year ago, history of anxiety, bronchitis, CAD, diabetes, COPD, presenting with right sided chest and flank pain for the last several hours.  States no new numbness or tingling, has diabetic neuropathy that he says is at baseline.  Denies any recent trauma or falls.  No shortness of breath.  Does note some nausea.  Also states that he has some right upper quadrant abdominal pain as well.  No fever, cough, urinary symptoms, diarrhea.  He denies any trauma or falls with patient states that the pain is worse with movement and twisting.  Patient is status post cholecystectomy.  On admission Raymond [name], he was seen by his oncologist in January, history of right lung adenocarcinoma stage Ia status post VATS in 2024.  Patient states that he is not on any treatment currently they are just surveilling him.     Physical Exam   Triage Vital Signs: ED Triage Vitals  Encounter Vitals Group     BP 09/15/23 1657 (!) 155/91     Systolic BP Percentile --      Diastolic BP Percentile --      Pulse Rate 09/15/23 1657 (!) 0     Resp 09/15/23 1657 20     Temp 09/15/23 1657 98.2 F (36.8 C)     Temp Source 09/15/23 1657 Oral     SpO2 09/15/23 1657 98 %     Weight 09/15/23 1655 227 lb (103 kg)     Height 09/15/23 1655 5\' 9"  (1.753 m)     Head Circumference --      Peak Flow --      Pain Score 09/15/23 1655 10     Pain Loc --      Pain Education --      Exclude from Growth Chart --     Most recent vital signs: Vitals:   09/15/23 1835 09/15/23 2000  BP: (!) 146/82 131/80  Pulse: 89 (!) 106  Resp:  19  Temp:    SpO2: 97% 96%     General: Awake, no distress.  CV:  Good peripheral perfusion.  Resp:  Normal effort.  Clear, he does have  thoracic cage tenderness to his right lateral side. Abd:  No distention.  Soft, mildly tender of the right upper quadrant without guarding. Other:  No overlying rash, his prior VATS sites are intact without overlying erythema, purulent drainage, dehiscence.  He does have tenderness to the right flank, no CVA tenderness bilaterally, moving all 4 extremities without focal weakness or numbness, he is equal radial pulses.   ED Results / Procedures / Treatments   Labs (all labs ordered are listed, but only abnormal results are displayed) Labs Reviewed  COMPREHENSIVE METABOLIC PANEL WITH GFR - Abnormal; Notable for the following components:      Result Value   Glucose, Bld 126 (*)    All other components within normal limits  URINALYSIS, W/ REFLEX TO CULTURE (INFECTION SUSPECTED) - Abnormal; Notable for the following components:   Color, Urine YELLOW (*)    APPearance CLEAR (*)    Specific Gravity, Urine 1.033 (*)    Glucose, UA >=500 (*)    All other components within normal limits  LIPASE, BLOOD  CBC  TROPONIN I (HIGH SENSITIVITY)  TROPONIN I (HIGH SENSITIVITY)     EKG  EKG shows, EKG shows sinus rhythm, rate 94, normal QRS, normal QTc, T wave flattening in 1, aVL, no ischemic ST elevation, not significantly changed compared to prior   RADIOLOGY On my independent interpretation, CT PE study without obvious PE   PROCEDURES:  Critical Care performed: No  Procedures   MEDICATIONS ORDERED IN ED: Medications  lidocaine  (LIDODERM ) 5 % 1 patch (1 patch Transdermal Patch Applied 09/15/23 1746)  morphine (PF) 4 MG/ML injection 4 mg (4 mg Intravenous Given 09/15/23 1746)  iohexol  (OMNIPAQUE ) 350 MG/ML injection 100 mL (100 mLs Intravenous Contrast Given 09/15/23 1817)  oxyCODONE  (Oxy IR/ROXICODONE ) immediate release tablet 5 mg (5 mg Oral Given 09/15/23 2029)     IMPRESSION / MDM / ASSESSMENT AND PLAN / ED COURSE  I reviewed the triage vital signs and the nursing notes.                               Differential diagnosis includes, but is not limited to, considered musculoskeletal pain, strain, ACS, PE, kidney stone, retained stone, pending mass, UTI.  Will get labs, EKG, troponin, CT PE study, CT abdomen pelvis.  Will give him some IV morphine as well as Lidoderm  patch for pain.  Patient's presentation is most consistent with acute presentation with potential threat to life or bodily function.  Independent interpretation of labs and imaging below.  Discussed labs and imaging findings include incidental findings with patient.  Otherwise considered but no indication for inpatient admission at this time, he is safe for outpatient management.  Suspect given the tenderness on palpation as well as pain worse with movement and twisting that he might of strained himself.  Will instruct him to take Tylenol  as needed for pain, also give him a short prescription for oxycodone  for severe breakthrough pain.  Will have him follow-up with his primary care doctor next week to get reassessed.  Shared decision making to patient he is agreeable plan.  Strict precautions given.  Will discharge.  The patient is on the cardiac monitor to evaluate for evidence of arrhythmia and/or significant heart rate changes.   Clinical Course as of 09/15/23 2110  Sat Sep 15, 2023  0981 Independent review of labs, electrolytes were deranged, LFTs are normal, no leukocytosis, lipase is normal, troponin is not elevated, UA is not consistent with UTI. [TT]  1920 CT Angio Chest PE W/Cm &/Or Wo Cm IMPRESSION: 1. No evidence of pulmonary embolism or other acute intrathoracic process. 2. Mild emphysematous lung disease with mild, stable linear scarring and/or atelectasis within the right lower lobe. 3. Stable, likely benign 4 mm left lower lobe pulmonary nodule. 4. Evidence of prior right lower lobe superior segmentectomy. 5. Aortic atherosclerosis.   [TT]  1921 CT ABDOMEN PELVIS W CONTRAST IMPRESSION: 1. No  acute intra-abdominal process. 2. Stable scarring at the right lung base. 3. 3 mm left lower lobe pulmonary nodule, unchanged from 2022 and likely benign. 4. Hepatic steatosis. 5. Aortic atherosclerosis.   [TT]  2104 Troponin I (High Sensitivity) Troponin x 2 is not elevated. [TT]    Clinical Course User Index [TT] Drenda Gentle Richard Champion, MD     FINAL CLINICAL IMPRESSION(S) / ED DIAGNOSES   Final diagnoses:  Chest wall pain  Flank pain  Lung nodule  Hepatic steatosis     Rx / DC Orders  ED Discharge Orders          Ordered    oxyCODONE  (OXY IR/ROXICODONE ) 5 MG immediate release tablet  Every 6 hours PRN        09/15/23 2109    lidocaine  (LIDODERM ) 5 %  Every 24 hours        09/15/23 2109             Note:  This document was prepared using Dragon voice recognition software and may include unintentional dictation errors.    Shane Darling, MD 09/15/23 2110

## 2023-09-24 ENCOUNTER — Ambulatory Visit (INDEPENDENT_AMBULATORY_CARE_PROVIDER_SITE_OTHER): Admitting: Nurse Practitioner

## 2023-09-24 ENCOUNTER — Other Ambulatory Visit: Payer: Self-pay

## 2023-09-24 ENCOUNTER — Encounter: Payer: Self-pay | Admitting: Nurse Practitioner

## 2023-09-24 VITALS — BP 104/70 | HR 117 | Temp 98.4°F | Resp 16 | Ht 69.0 in | Wt 209.2 lb

## 2023-09-24 DIAGNOSIS — M48062 Spinal stenosis, lumbar region with neurogenic claudication: Secondary | ICD-10-CM

## 2023-09-24 DIAGNOSIS — Z23 Encounter for immunization: Secondary | ICD-10-CM

## 2023-09-24 DIAGNOSIS — R0602 Shortness of breath: Secondary | ICD-10-CM

## 2023-09-24 DIAGNOSIS — M5416 Radiculopathy, lumbar region: Secondary | ICD-10-CM | POA: Diagnosis not present

## 2023-09-24 MED ORDER — ZOSTER VAC RECOMB ADJUVANTED 50 MCG/0.5ML IM SUSR: 0.5000 mL | Freq: Once | INTRAMUSCULAR | 1 refills | Status: DC | PRN

## 2023-09-24 MED ORDER — CYCLOBENZAPRINE HCL 10 MG PO TABS: 5.0000 mg | ORAL_TABLET | Freq: Three times a day (TID) | ORAL | 2 refills | Status: AC | PRN

## 2023-09-24 MED ORDER — OXYCODONE HCL 5 MG PO TABS: 5.0000 mg | ORAL_TABLET | Freq: Two times a day (BID) | ORAL | 0 refills | Status: AC | PRN

## 2023-09-24 NOTE — Progress Notes (Signed)
 [facility] [address] [place], [place] [address]  Internal MEDICINE  Office Visit Note  Patient Name: TIMOHTY [name]  [id number]  [id number]  Date of Service: 09/24/2023  Chief Complaint  Patient presents with   Depression   Diabetes   Gastroesophageal Reflux   Hyperlipidemia   Hypertension   Follow-up    ED f/u     HPI Vimal presents for a follow-up visit for recent ED visit.  Lower right flank pain most likely associated with lumbar degenerative changes and spinal stenosis as seen on CT scan in the ER. Has seen neurosurgery in the past and was sent to PT.  Aortic atherosclerosis and coronary calcifications that are mild to moderate.     Current Medication: Outpatient Encounter Medications as of 09/24/2023  Medication Sig   Accu-Chek Softclix Lancets lancets Use 1 lancet to check glucose once daily and as needed for diabetes   acetaminophen  (TYLENOL ) 500 MG tablet Take 2 tablets (1,000 mg total) by mouth 4 (four) times daily.   albuterol  (VENTOLIN  HFA) 108 (90 Base) MCG/ACT inhaler INHALE 2 PUFFS INTO THE LUNGS EVERY 6 HOURS AS MEEDED FOR WHEEZING OR SHORTNESS OF BREATH   aspirin  EC 81 MG tablet Take 81 mg by mouth daily at 3 pm.    atorvastatin  (LIPITOR) 40 MG tablet Take 1 tablet (40 mg total) by mouth daily.   Blood Glucose Monitoring Suppl (ACCU-CHEK GUIDE ME) w/Device KIT Use as directed. E11.65   BREZTRI  AEROSPHERE 160-9-4.8 MCG/ACT AERO INHALE 2 PUFFS TWICE DAILY   buPROPion  (WELLBUTRIN  XL) 150 MG 24 hr tablet Take 1 tablet (150 mg total) by mouth daily. Take along with 300 mg daily   buPROPion  (WELLBUTRIN  XL) 300 MG 24 hr tablet Take 1 tablet (300 mg total) by mouth daily. Take along with 150 mg daily   Continuous Glucose Sensor (DEXCOM G7 SENSOR) MISC Use one every 10 days for uncontrolled diabetes E11.65   cyclobenzaprine  (FLEXERIL ) 10 MG tablet Take 0.5-1 tablets (5-10 mg total) by mouth 3 (three) times daily as needed for muscle spasms.    dapagliflozin  propanediol (FARXIGA ) 10 MG TABS tablet Take 1 tablet (10 mg total) by mouth daily before breakfast.   DULoxetine  (CYMBALTA ) 30 MG capsule Take 1 capsule (30 mg total) by mouth daily. Take along with 60 mg daily , total of 90 mg daily   DULoxetine  (CYMBALTA ) 60 MG capsule Take 1 capsule (60 mg total) by mouth daily. Take along with 30 mg daily   ezetimibe  (ZETIA ) 10 MG tablet Take 1 tablet (10 mg total) by mouth daily.   famotidine  (PEPCID ) 20 MG tablet Take 1 tablet by mouth twice daily   glucose blood (ACCU-CHEK GUIDE) test strip Use 1 test strip to check fasting glucose once daily and as need for diabetes   insulin  glargine, 2 Unit Dial , (TOUJEO  MAX SOLOSTAR) 300 UNIT/ML Solostar Pen Inject 50 Units into the skin daily.   insulin  lispro (HUMALOG  KWIKPEN) 200 UNIT/ML KwikPen INJECT INSULIN  INTO THE SKIN WITH MEALS THREE TIMES DAILY PER SLIDING SCALE PROVIDED. MAX DAILY DOSE IS 42 UNITS   Insulin  Pen Needle 32G X 4 MM MISC Use with daily dose insulin  and with sliding scale insulin  as needed.   lidocaine  (LIDODERM ) 5 % Place 1 patch onto the skin daily. Remove & Discard patch within 12 hours or as directed by MD   Melatonin 10 MG CAPS Take 30 mg by mouth at bedtime.   meloxicam  (MOBIC ) 15 MG tablet Take 15 mg by  mouth daily.   metoprolol  tartrate (LOPRESSOR ) 100 MG tablet Take 1 tablet by mouth twice daily   nitroGLYCERIN  (NITROSTAT ) 0.4 MG SL tablet Place 1 tablet (0.4 mg total) under the tongue every 5 (five) minutes x 3 doses as needed for chest pain.   OZEMPIC , 1 MG/DOSE, 4 MG/3ML SOPN INJECT 1 DOSE SUBCUTANEOUSLY ONCE A WEEK   pantoprazole  (PROTONIX ) 40 MG tablet Take 1 tablet by mouth once daily   pregabalin  (LYRICA ) 50 MG capsule Take 1 capsule (50 mg total) by mouth 3 (three) times daily. For 7 days then increase to 2 capsules PO 3 times daily   rOPINIRole  (REQUIP ) 0.25 MG tablet Take 1 tablet (0.25 mg total) by mouth 3 (three) times daily.   tamsulosin  (FLOMAX ) 0.4 MG CAPS  capsule Take 1 capsule by mouth once daily   temazepam  (RESTORIL ) 30 MG capsule Take 1 capsule (30 mg total) by mouth at bedtime as needed for sleep.   traMADol  (ULTRAM ) 50 MG tablet TAKE 1 TABLET BY MOUTH TWICE DAILY AS NEEDED FOR PAIN OF  RIGHT  RIB  CAGE   varenicline  (CHANTIX ) 1 MG tablet Take 1 tablet by mouth twice daily   Zoster Vaccine Adjuvanted (SHINGRIX) injection Inject 0.5 mLs into the muscle once as needed for up to 1 dose (vaccination).   [DISCONTINUED] oxyCODONE  (OXY IR/ROXICODONE ) 5 MG immediate release tablet Take 1 tablet (5 mg total) by mouth every 6 (six) hours as needed for severe pain (pain score 7-10).   oxyCODONE  (OXY IR/ROXICODONE ) 5 MG immediate release tablet Take 1 tablet (5 mg total) by mouth 2 (two) times daily as needed for severe pain (pain score 7-10).   No facility-administered encounter medications on file as of 09/24/2023.    Surgical History: Past Surgical History:  Procedure Laterality Date    ingrown toenail removal     CHOLECYSTECTOMY N/A 01/23/2019   Procedure: LAPAROSCOPIC CHOLECYSTECTOMY;  Surgeon: Emmalene Hare, MD;  Location: [facility] ORS;  Service: General;  Laterality: N/A;   CLAVICLE SURGERY Right    COLONOSCOPY  2017   COLONOSCOPY WITH PROPOFOL  N/A 08/25/2020   Procedure: COLONOSCOPY WITH PROPOFOL ;  Surgeon: Irby Mannan, MD;  Location: [facility] END[facility]COPY;  Service: Endoscopy;  Laterality: N/A;   ESOPHAGOGASTRODUODENOSCOPY (EGD) WITH PROPOFOL  N/A 08/25/2020   Procedure: ESOPHAGOGASTRODUODENOSCOPY (EGD) WITH PROPOFOL ;  Surgeon: Irby Mannan, MD;  Location: [facility] ENDOSCOPY;  Service: Endoscopy;  Laterality: N/A;   IR THORACENTESIS ASP PLEURAL SPACE W/IMG GUIDE  11/20/2022   LUNG LOBECTOMY Right 2024   LYMPH NODE DISSECTION Right 10/27/2022   Procedure: LYMPH NODE DISSECTION;  Surgeon: Zelphia Higashi, MD;  Location: [facility] OR;  Service: Thoracic;  Laterality: Right;   MOUTH SURGERY     REMOVED ALL TEETH   SHOULDER SURGERY Right     UPPER GI ENDOSCOPY  2017   XI ROBOTIC ASSISTED THORACOSCOPY- SEGMENTECTOMY Right 10/27/2022   Procedure: XI ROBOTIC ASSISTED THORACOSCOPY-RIGHT LOWER LOBE SUPERIOR SEGMENTECTOMY;  Surgeon: Zelphia Higashi, MD;  Location: [facility] OR;  Service: Thoracic;  Laterality: Right;    Medical History: Past Medical History:  Diagnosis Date   Anxiety    Arthritis    NECK   Bronchitis    Cancer (HCC)    R lung, had lobectomy   Coronary artery disease    Depression    Diabetes mellitus without complication (HCC)    Diastasis of rectus abdominis 06/19/2018   Dyspnea    DUE TO GALLBLADDER PER PT   Dysrhythmia    "fast hear rate  at times. stopped when placed on metoprolol "   Emphysema lung (HCC)    Family history of adverse reaction to anesthesia    N/V, TROUBLE BREATHING COMING OUT OF ANESTHESIA   Fatty liver    GERD (gastroesophageal reflux disease)    Headache    MIGRAINES   Hyperlipidemia    Hypertension    Irregular heart beat unk   Myocardial infarction (HCC) 2010   MILD    PTSD (post-traumatic stress disorder)    Sleep apnea    USES CPAP   Tachycardia    Ventral hernia     Family History: Family History  Problem Relation Age of Onset   Hypertension Mother    Diabetes type II Mother    Diabetes Mother    COPD Father    Lung cancer Father    Heart disease Father    Diabetes Sister    Anxiety disorder Sister    Depression Sister    Diabetes Sister    Anxiety disorder Sister    Depression Sister    Diabetes Sister    Anxiety disorder Sister    Depression Sister    Diabetes Sister    Anxiety disorder Sister    Depression Sister    Diabetes Brother    Anxiety disorder Brother    Depression Brother    Diabetes Maternal Aunt    Colon cancer Maternal Uncle     Social History   Socioeconomic History   Marital status: Divorced    Spouse name: Not on file   Number of children: 4   Years of education: Not on file   Highest education level: 8th grade   Occupational History   Not on file  Tobacco Use   Smoking status: Every Day    Current packs/day: 0.25    Average packs/day: 0.3 packs/day for 40.0 years (10.0 ttl pk-yrs)    Types: Cigarettes   Smokeless tobacco: Former    Types: Chew   Tobacco comments:    2-3 cigarettes per day   Vaping Use   Vaping status: Former  Substance and Sexual Activity   Alcohol use: No    Alcohol/week: 0.0 standard drinks of alcohol   Drug use: No   Sexual activity: Not Currently  Other Topics Concern   Not on file  Social History Narrative   Not on file   Social Drivers of Health   Financial Resource Strain: Low Risk  (08/17/2020)   Overall Financial Resource Strain (CARDIA)    Difficulty of Paying Living Expenses: Not very hard  Food Insecurity: Food Insecurity Present (12/14/2022)   Hunger Vital Sign    Worried About Running Out of Food in the Last Year: Often true    Ran Out of Food in the Last Year: Often true  Transportation Needs: No Transportation Needs (12/14/2022)   PRAPARE - Administrator, Civil Service (Medical): No    Lack of Transportation (Non-Medical): No  Physical Activity: Inactive (05/23/2018)   Exercise Vital Sign    Days of Exercise per Week: 0 days    Minutes of Exercise per Session: 0 min  Stress: Stress Concern Present (05/23/2018)   [employer] of Occupational Health - Occupational Stress Questionnaire    Feeling of Stress : Very much  Social Connections: Unknown (05/23/2018)   Social Connection and Isolation Panel [NHANES]    Frequency of Communication with Friends and Family: Not on file    Frequency of Social Gatherings with Friends and Family: Not on file  Attends Religious Services: Never    Active Member of Clubs or Organizations: No    Attends Banker Meetings: Never    Marital Status: Divorced  Catering manager Violence: Not At Risk (12/14/2022)   Humiliation, Afraid, Rape, and Kick questionnaire    Fear of Current or  Ex-Partner: No    Emotionally Abused: No    Physically Abused: No    Sexually Abused: No      Review of Systems  Constitutional:  Negative for chills, fatigue and unexpected weight change.  HENT:  Negative for congestion, rhinorrhea, sneezing and sore throat.   Eyes:  Negative for redness.  Respiratory: Negative.  Negative for cough, chest tightness, shortness of breath and wheezing.   Cardiovascular: Negative.  Negative for chest pain and palpitations.  Gastrointestinal:  Negative for abdominal pain, constipation, diarrhea, nausea and vomiting.  Genitourinary:  Negative for dysuria, flank pain and frequency.  Musculoskeletal:  Positive for arthralgias. Negative for back pain, joint swelling and neck pain.  Skin:  Negative for color change and rash.  Neurological: Negative.  Negative for tremors and numbness.       Neuropathy  Hematological:  Negative for adenopathy. Does not bruise/bleed easily.  Psychiatric/Behavioral:  Negative for behavioral problems (Depression), self-injury, sleep disturbance and suicidal ideas. The patient is not nervous/anxious.     Vital Signs: BP 104/70   Pulse (!) 117   Temp 98.4 F (36.9 C)   Resp 16   Ht 5\' 9"  (1.753 m)   Wt 209 lb 3.2 oz (94.9 kg)   SpO2 94%   BMI 30.89 kg/m    Physical Exam Vitals reviewed.  Constitutional:      General: He is not in acute distress.    Appearance: Normal appearance. He is obese. He is not ill-appearing.  HENT:     Head: Normocephalic and atraumatic.  Eyes:     Pupils: Pupils are equal, round, and reactive to light.  Cardiovascular:     Rate and Rhythm: Normal rate and regular rhythm.  Pulmonary:     Effort: Pulmonary effort is normal. No respiratory distress.  Neurological:     Mental Status: He is alert and oriented to person, place, and time.  Psychiatric:        Mood and Affect: Mood normal.        Behavior: Behavior normal.        Assessment/Plan: 1. Lumbar radiculopathy, chronic  (Primary) Referred to pain management, continue prn oxycodone   - Ambulatory referral to Pain Clinic - oxyCODONE  (OXY IR/ROXICODONE ) 5 MG immediate release tablet; Take 1 tablet (5 mg total) by mouth 2 (two) times daily as needed for severe pain (pain score 7-10).  Dispense: 40 tablet; Refill: 0 - cyclobenzaprine  (FLEXERIL ) 10 MG tablet; Take 0.5-1 tablets (5-10 mg total) by mouth 3 (three) times daily as needed for muscle spasms.  Dispense: 60 tablet; Refill: 2  2. Spinal stenosis of lumbar region with neurogenic claudication Referred to pain management  - Ambulatory referral to Pain Clinic - oxyCODONE  (OXY IR/ROXICODONE ) 5 MG immediate release tablet; Take 1 tablet (5 mg total) by mouth 2 (two) times daily as needed for severe pain (pain score 7-10).  Dispense: 40 tablet; Refill: 0 - cyclobenzaprine  (FLEXERIL ) 10 MG tablet; Take 0.5-1 tablets (5-10 mg total) by mouth 3 (three) times daily as needed for muscle spasms.  Dispense: 60 tablet; Refill: 2  3. Need for shingles vaccine - Zoster Vaccine Adjuvanted [facility]) injection; Inject 0.5 mLs into the muscle once  as needed for up to 1 dose (vaccination).  Dispense: 0.5 mL; Refill: 1   General Counseling: Norwin verbalizes understanding of the findings of todays visit and agrees with plan of treatment. I have discussed any further diagnostic evaluation that may be needed or ordered today. We also reviewed his medications today. he has been encouraged to call the office with any questions or concerns that should arise related to todays visit.    Orders Placed This Encounter  Procedures   Ambulatory referral to Pain Clinic    Meds ordered this encounter  Medications   Zoster Vaccine Adjuvanted [facility]) injection    Sig: Inject 0.5 mLs into the muscle once as needed for up to 1 dose (vaccination).    Dispense:  0.5 mL    Refill:  1    Needs 2 dose series   oxyCODONE  (OXY IR/ROXICODONE ) 5 MG immediate release tablet    Sig: Take 1  tablet (5 mg total) by mouth 2 (two) times daily as needed for severe pain (pain score 7-10).    Dispense:  40 tablet    Refill:  0    For refill after recent ER visit.   cyclobenzaprine  (FLEXERIL ) 10 MG tablet    Sig: Take 0.5-1 tablets (5-10 mg total) by mouth 3 (three) times daily as needed for muscle spasms.    Dispense:  60 tablet    Refill:  2    Fill new script today    Return if symptoms worsen or fail to improve, for referred to pain management.   Total time spent:30 Minutes Time spent includes review of chart, medications, test results, and follow up plan with the patient.   [place] Controlled Substance Database was reviewed by me.  This patient was seen by Laurence Pons, FNP-C in collaboration with Dr. Verneta Gone as a part of collaborative care agreement.   Macdonald Rigor R. Bobbi Burow, MSN, FNP-C Internal medicine

## 2023-09-25 ENCOUNTER — Encounter: Payer: Self-pay | Admitting: Nurse Practitioner

## 2023-09-25 DIAGNOSIS — M5416 Radiculopathy, lumbar region: Secondary | ICD-10-CM | POA: Insufficient documentation

## 2023-09-25 DIAGNOSIS — M48062 Spinal stenosis, lumbar region with neurogenic claudication: Secondary | ICD-10-CM | POA: Insufficient documentation

## 2023-09-26 ENCOUNTER — Other Ambulatory Visit: Payer: Self-pay | Admitting: Nurse Practitioner

## 2023-09-26 DIAGNOSIS — E1169 Type 2 diabetes mellitus with other specified complication: Secondary | ICD-10-CM

## 2023-09-28 DIAGNOSIS — E119 Type 2 diabetes mellitus without complications: Secondary | ICD-10-CM | POA: Diagnosis not present

## 2023-09-28 DIAGNOSIS — H2513 Age-related nuclear cataract, bilateral: Secondary | ICD-10-CM | POA: Diagnosis not present

## 2023-09-28 LAB — HM DIABETES EYE EXAM

## 2023-10-01 ENCOUNTER — Other Ambulatory Visit: Payer: Self-pay | Admitting: Psychiatry

## 2023-10-01 DIAGNOSIS — F17200 Nicotine dependence, unspecified, uncomplicated: Secondary | ICD-10-CM

## 2023-10-03 ENCOUNTER — Ambulatory Visit (INDEPENDENT_AMBULATORY_CARE_PROVIDER_SITE_OTHER): Admitting: Internal Medicine

## 2023-10-03 ENCOUNTER — Other Ambulatory Visit: Payer: Self-pay | Admitting: Internal Medicine

## 2023-10-03 ENCOUNTER — Other Ambulatory Visit: Payer: Self-pay | Admitting: Nurse Practitioner

## 2023-10-03 DIAGNOSIS — Z79899 Other long term (current) drug therapy: Secondary | ICD-10-CM

## 2023-10-03 DIAGNOSIS — R0602 Shortness of breath: Secondary | ICD-10-CM

## 2023-10-03 DIAGNOSIS — K219 Gastro-esophageal reflux disease without esophagitis: Secondary | ICD-10-CM

## 2023-10-04 ENCOUNTER — Other Ambulatory Visit: Payer: Self-pay | Admitting: Nurse Practitioner

## 2023-10-04 DIAGNOSIS — I700 Atherosclerosis of aorta: Secondary | ICD-10-CM

## 2023-10-08 ENCOUNTER — Other Ambulatory Visit: Payer: Self-pay

## 2023-10-08 DIAGNOSIS — E1169 Type 2 diabetes mellitus with other specified complication: Secondary | ICD-10-CM

## 2023-10-08 NOTE — Progress Notes (Signed)
 I also for to mention, we discussed calling 1-800-Quit-Now for additional support. Alternatively, you could text QUITNOW to [phone] (message and data rates may apply. You could also copy and paste this link ( [email or web address] ) for more information on cravings.

## 2023-10-08 NOTE — Progress Notes (Signed)
 10/08/2023 Name: Raymond [name] MRN: [id number] DOB: 06-23-1965  Chief Complaint  Patient presents with   Diabetes   True [place] Metric    Raymond [name] is a 58 y.o. year old male who presented for a telephone visit.   They were referred to the pharmacist by a quality report for assistance in managing diabetes.    Subjective:  Care Team: Primary Care Provider: Laurence Pons, NP ; Next Scheduled Visit: 10/24/2023   Medication Access/Adherence  Current Pharmacy:  [facility] [address] [place] - [address] [place] [address] Phone: [phone] Fax: [phone]   Patient reports affordability concerns with their medications: No  Patient reports access/transportation concerns to their pharmacy: No  Patient reports adherence concerns with their medications:  No  He uses pillbox   Diabetes:  Current medications: Ozempic  1 mg weekly (Thursdays), Toujeo  50 units daily, Humalog  SSI BG >151 mg/dL three times daily he reports (last used 4 units last weekly; typically two times a day)   Current glucose readings:  Fasting 120 average PPBG 170 average Using CGM meter; testing several times daily    Patient denies hypoglycemic s/sx including dizziness, shakiness, sweating. Patient denies hyperglycemic symptoms including polyuria (none above baseline), polydipsia, polyphagia, nocturia (none above baseline), blurred vision. 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   Current meal patterns:  - Breakfast: typically skips  - Lunch sandwich - Supper chicken tacos  - Snacks hotdog (no bread), bologna (no bread)  - Drinks soda (regular) ~ 2 cups per day, water   Congratulated patient on A1c improvement and patient attributes to reducing consuming sodas  Current physical activity: daily walks three time daily with  dog  The ASCVD Risk score (Arnett DK, et al., 2019) failed to calculate for the following reasons:   Risk score cannot be calculated because patient has a medical history suggesting prior/existing ASCVD- he takes atorvastatin  40 mg daily as well as Zetia  10 mg daily   Hypertension:  Current medications: metoprolol  tartrate 100 mg twice daily   Patient does have a validated, automated, upper arm home BP cuff but needs new batteries   Patient denies hypotensive s/sx including dizziness, lightheadedness.  Patient reports hypertensive symptoms including shortness of breath lately and recently completed a breathing test and recent ED for side pains he reports it was not chest pain)  Current meal patterns: monitor salt intake      Tobacco Abuse:  Tobacco Use History: Number of cigarettes per day 1/2 ppd (~ 10 cigarettes) and still actively taking Chantix  1 mg twice and denies missing doses He does report that he tries to smoke one-half a cigarette and smoke the other half a few hours later which is how he smokes ~ 1/2 ppd  Smokes first cigarette shortly after waking Does not wake at night to smoke unless his dog barks and wakes him up Triggers include after eating, driving in the care, boredom at home Patient stated Nicoderm patches caused a rash due to the duration of use. When asked about switching sites of patch placement, it was not clear if he would alternate patch placement but he felt the patch itself caused the rash.  He has Nicotine  lozenges, but does not feel that lozenge is something he could sustain holding in his mouth.    Current medication access support: 1-800- Quit (he is  aware but didn't know how useful)    Objective:  Lab Results  Component Value Date   HGBA1C 6.7 (A) 08/08/2023    Lab Results  Component Value Date   CREATININE 1.21 09/15/2023   BUN 13 09/15/2023   NA 140 09/15/2023   K 3.6 09/15/2023   CL 104 09/15/2023   CO2 24 09/15/2023    Lab  Results  Component Value Date   CHOL 151 11/11/2021   HDL 34 (L) 11/11/2021   LDLCALC 76 11/11/2021   TRIG 251 (H) 11/11/2021   CHOLHDL 4.4 11/11/2021    Medications Reviewed Today     Reviewed by Amedeo Bailiff, RPH (Pharmacist) on 10/08/23 at 1116  Med List Status: <None>   Medication Order Taking? Sig Documenting Provider Last Dose Status Informant  Accu-Chek Softclix Lancets lancets [id number] Yes Use 1 lancet to check glucose once daily and as needed for diabetes Laurence Pons, NP  Active   acetaminophen  (TYLENOL ) 500 MG tablet [id number] Yes Take 2 tablets (1,000 mg total) by mouth 4 (four) times daily.  Patient taking differently: Take 1,000 mg by mouth every 6 (six) hours as needed.   Masters, Psychiatric nurse, DO  Active   albuterol  (VENTOLIN  HFA) 108 (90 Base) MCG/ACT inhaler [id number] Yes INHALE 2 PUFFS INTO THE LUNGS EVERY 6 HOURS AS MEEDED FOR WHEEZING OR SHORTNESS OF Baldemar Lev, NP  Active Self, Pharmacy Records  aspirin  EC 81 MG tablet [id number] Yes Take 81 mg by mouth daily at 3 pm.  [provider]  Active Self, Pharmacy Records  atorvastatin  (LIPITOR) 40 MG tablet [id number] Yes Take 1 tablet (40 mg total) by mouth daily. Laurence Pons, NP  Active   Blood Glucose Monitoring Suppl (ACCU-CHEK GUIDE ME) w/Device KIT [id number] Yes Use as directed. E11.65 Laurence Pons, NP  Active   BREZTRI  AEROSPHERE 160-9-4.8 MCG/ACT AERO [id number] Yes INHALE 2 PUFFS TWICE DAILY Abernathy, Alyssa, NP  Active Self, Pharmacy Records  buPROPion  (WELLBUTRIN  XL) 150 MG 24 hr tablet [id number] Yes Take 1 tablet (150 mg total) by mouth daily. Take along with 300 mg daily Eappen, Saramma, MD  Active   buPROPion  (WELLBUTRIN  XL) 300 MG 24 hr tablet [id number] Yes Take 1 tablet (300 mg total) by mouth daily. Take along with 150 mg daily Eappen, Saramma, MD  Active   Continuous Glucose Sensor (DEXCOM G7 SENSOR) MISC [id number] Yes Use one every 10 days for uncontrolled diabetes E11.65  Laurence Pons, NP  Active   cyclobenzaprine  (FLEXERIL ) 10 MG tablet [id number] Yes Take 0.5-1 tablets (5-10 mg total) by mouth 3 (three) times daily as needed for muscle spasms. Laurence Pons, NP  Active   dapagliflozin  propanediol (FARXIGA ) 10 MG TABS tablet [id number] Yes Take 1 tablet (10 mg total) by mouth daily before breakfast. Laurence Pons, NP  Active   DULoxetine  (CYMBALTA ) 30 MG capsule [id number] Yes Take 1 capsule (30 mg total) by mouth daily. Take along with 60 mg daily , total of 90 mg daily Eappen, Saramma, MD  Active   DULoxetine  (CYMBALTA ) 60 MG capsule [id number] Yes Take 1 capsule (60 mg total) by mouth daily. Take along with 30 mg daily Eappen, Saramma, MD  Active   ezetimibe  (ZETIA ) 10 MG tablet [id number] Yes Take 1 tablet by mouth once daily Abernathy, Alyssa, NP  Active   famotidine  (PEPCID ) 20 MG tablet [id number] Yes Take 1 tablet by mouth twice daily Abernathy, Alyssa, NP  Active   glucose blood (ACCU-CHEK GUIDE) test strip [id number] Yes Use 1 test  strip to check fasting glucose once daily and as need for diabetes Abernathy, Alyssa, NP  Active   insulin  glargine, 2 Unit Dial , (TOUJEO  MAX SOLOSTAR) 300 UNIT/ML Solostar Pen [id number] Yes Inject 50 Units into the skin daily. Laurence Pons, NP  Active   insulin  lispro (HUMALOG  KWIKPEN) 200 UNIT/ML KwikPen [id number]  INJECT INSULIN  INTO THE SKIN WITH MEALS THREE TIMES DAILY PER SLIDING SCALE PROVIDED. MAX DAILY DOSE IS 42 UNITS  Patient not taking: Reported on 10/08/2023   Laurence Pons, NP  Active   Insulin  Pen Needle 32G X 4 MM MISC [id number] Yes Use with daily dose insulin  and with sliding scale insulin  as needed. Sharyon Deis, NP  Active Self, Pharmacy Records  lidocaine  (LIDODERM ) 5 % [id number] Yes Place 1 patch onto the skin daily. Remove & Discard patch within 12 hours or as directed by MD Drenda Gentle Richard Champion, MD  Active   Melatonin 10 MG CAPS [id number] Yes Take 30 mg by mouth at bedtime. [provider]   Active Self, Pharmacy Records  meloxicam  (MOBIC ) 15 MG tablet [id number] Yes Take 15 mg by mouth daily. [provider]  Active   metoprolol  tartrate (LOPRESSOR ) 100 MG tablet [id number] Yes Take 1 tablet by mouth twice daily Abernathy, Alyssa, NP  Active   nitroGLYCERIN  (NITROSTAT ) 0.4 MG SL tablet [id number] Yes Place 1 tablet (0.4 mg total) under the tongue every 5 (five) minutes x 3 doses as needed for chest pain. Laurence Pons, NP  Active Self, Pharmacy Records  oxyCODONE  (OXY IR/ROXICODONE ) 5 MG immediate release tablet [id number] Yes Take 1 tablet (5 mg total) by mouth 2 (two) times daily as needed for severe pain (pain score 7-10). Laurence Pons, NP  Active   OZEMPIC , 1 MG/DOSE, 4 MG/3ML SOPN [id number] Yes INJECT 1MG   SUBCUTANEOUSLY ONCE A WEEK Abernathy, Alyssa, NP  Active   pantoprazole  (PROTONIX ) 40 MG tablet [id number] Yes Take 1 tablet by mouth once daily Abernathy, Alyssa, NP  Active   pregabalin  (LYRICA ) 50 MG capsule [id number] Yes Take 1 capsule (50 mg total) by mouth 3 (three) times daily. For 7 days then increase to 2 capsules PO 3 times daily Abernathy, Alyssa, NP  Active   rOPINIRole  (REQUIP ) 0.25 MG tablet [id number] Yes Take 1 tablet (0.25 mg total) by mouth 3 (three) times daily. Eappen, Saramma, MD  Active   tamsulosin  (FLOMAX ) 0.4 MG CAPS capsule [id number] Yes Take 1 capsule by mouth once daily Abernathy, Alyssa, NP  Active   temazepam  (RESTORIL ) 30 MG capsule [id number] Yes Take 1 capsule (30 mg total) by mouth at bedtime as needed for sleep. Eappen, Saramma, MD  Active   traMADol  (ULTRAM ) 50 MG tablet [id number] Yes TAKE 1 TABLET BY MOUTH TWICE DAILY AS NEEDED FOR PAIN OF  RIGHT  RIB  CAGE Abernathy, Alyssa, NP  Active   varenicline  (CHANTIX ) 1 MG tablet [id number] Yes Take 1 tablet by mouth twice daily Eappen, Saramma, MD  Active   Zoster Vaccine Adjuvanted (SHINGRIX) injection [id number]  Inject 0.5 mLs into the muscle once as needed for up to 1 dose (vaccination).   Patient not taking: Reported on 10/08/2023   Laurence Pons, NP  Active               Assessment/Plan:   Diabetes: - Currently controlled - Reviewed long term cardiovascular and renal outcomes of uncontrolled blood sugar - Reviewed goal A1c, goal fasting, and goal 2 hour post prandial glucose - Reviewed dietary modifications including lower glyemic index foods and  higher protein.  - Reviewed lifestyle modifications including: 150 minutes/ week - Recommend to continue current regimen for now. Future consideration, with updated hemoglobin A1c and Dexcom data, to increase Ozempic  to 2 mg weekly and discontinue humalog  SSI - should blood glucose continues to improve and sustain.  - Patient denies personal or family history of multiple endocrine neoplasia type 2, medullary thyroid  cancer; personal history of pancreatitis or gallbladder disease. - Recommend to check glucose as often as you have been       Hypertension: - Currently controlled per recent blood pressure readings recorded in the chart.  - Reviewed long term cardiovascular and renal outcomes of uncontrolled blood pressure - Reviewed appropriate blood pressure monitoring technique and reviewed goal blood pressure. Recommended to check home blood pressure and heart rate at least once weekly.  - Recommend to continue current regimen.       Tobacco Abuse - Currently uncontrolled. Patient is taking Wellbutrin  150 mg XL and 300 mg for his mood as well as Chantix  for smoking cessation. He is unable to use Nicoderm due to localized rash.  - Provided motivational interviewing to assess tobacco use and strategies for reduction - Provided information on 1 800 QUIT NOW support program and use [employer] for additional information on tips to help with cravings. - Will collaborate with PCP to start nicotine  gum 2 mg as needed. To help with smoking cessation.  - Continue varenicline  1 mg twice daily for now. Will collaborate  with PCP regarding utility of medication.   Follow Up Plan: 6 weeks with pharmacy (telephone)  Alexandria Angel, PharmD Clinical Pharmacist Cell: [phone]

## 2023-10-10 ENCOUNTER — Other Ambulatory Visit: Payer: Self-pay | Admitting: Nurse Practitioner

## 2023-10-10 DIAGNOSIS — Z79899 Other long term (current) drug therapy: Secondary | ICD-10-CM

## 2023-10-17 ENCOUNTER — Encounter: Payer: 59 | Admitting: Internal Medicine

## 2023-10-24 ENCOUNTER — Encounter: Payer: Self-pay | Admitting: Nurse Practitioner

## 2023-10-24 ENCOUNTER — Ambulatory Visit (INDEPENDENT_AMBULATORY_CARE_PROVIDER_SITE_OTHER): Payer: 59 | Admitting: Nurse Practitioner

## 2023-10-24 VITALS — BP 108/76 | HR 87 | Temp 98.3°F | Resp 16 | Ht 69.0 in | Wt 204.8 lb

## 2023-10-24 DIAGNOSIS — J439 Emphysema, unspecified: Secondary | ICD-10-CM | POA: Diagnosis not present

## 2023-10-24 DIAGNOSIS — Z0000 Encounter for general adult medical examination without abnormal findings: Secondary | ICD-10-CM

## 2023-10-24 DIAGNOSIS — E1169 Type 2 diabetes mellitus with other specified complication: Secondary | ICD-10-CM

## 2023-10-24 DIAGNOSIS — Z1212 Encounter for screening for malignant neoplasm of rectum: Secondary | ICD-10-CM

## 2023-10-24 DIAGNOSIS — Z1211 Encounter for screening for malignant neoplasm of colon: Secondary | ICD-10-CM

## 2023-10-24 DIAGNOSIS — K648 Other hemorrhoids: Secondary | ICD-10-CM

## 2023-10-24 DIAGNOSIS — Z794 Long term (current) use of insulin: Secondary | ICD-10-CM

## 2023-10-24 MED ORDER — DAPAGLIFLOZIN PROPANEDIOL 10 MG PO TABS: 10.0000 mg | ORAL_TABLET | Freq: Every day | ORAL | 2 refills | Status: DC

## 2023-10-24 MED ORDER — BREZTRI AEROSPHERE 160-9-4.8 MCG/ACT IN AERO: 2.0000 | INHALATION_SPRAY | Freq: Two times a day (BID) | RESPIRATORY_TRACT | 0 refills | Status: DC

## 2023-10-24 NOTE — Progress Notes (Signed)
 [facility] [address] [place], [place] [address]  Internal MEDICINE  Office Visit Note  Patient Name: Raymond [name]  [id number]  [id number]  Date of Service: 10/24/2023  Chief Complaint  Patient presents with   Depression   Gastroesophageal Reflux   Diabetes   Hyperlipidemia   Hypertension   Medicare Wellness    HPI Robert presents for a medicare annual wellness visit.  Well-appearing 58 y.o. male with hypertension, aortic atherosclerosis, OSA, emphysema, GERD, barrett's esophagus, diabetes, neck pain, anxiety, depression, PTSD, and insomnia and history of lung cancer, s/p surgical removal last year.  Routine CRC screening: due now  Eye exam: recently done on 10/02/23 foot exam: neuropathy, no changes Labs: due for some routine labs  New or worsening pain: chronic back pain, no change.  Other concerns: none      10/24/2023   10:00 AM 10/23/2022   11:30 AM 10/19/2021    2:04 PM  MMSE - Mini Mental State Exam  Orientation to time 5 5 5   Orientation to Place 5 5 5   Registration 3 3 3   Attention/ Calculation 5 5 5   Recall 0 3 3  Language- name 2 objects 2 2 2   Language- repeat 1 1 1   Language- follow 3 step command 3 3 3   Language- read & follow direction 1 1 1   Write a sentence 1 1 1   Copy design 1 1 1   Total score 27 30 30     Functional Status Survey: Is the patient deaf or have difficulty hearing?: No Does the patient have difficulty seeing, even when wearing glasses/contacts?: Yes Does the patient have difficulty concentrating, remembering, or making decisions?: No Does the patient have difficulty walking or climbing stairs?: Yes Does the patient have difficulty dressing or bathing?: No Does the patient have difficulty doing errands alone such as visiting a doctor's office or shopping?: No     04/13/2022    8:09 AM 07/27/2022    1:45 PM 10/23/2022   11:31 AM 03/06/2023   10:40 AM 10/24/2023    9:59 AM  Fall Risk  Falls in the past year?  0 0 0  0  Was there an injury with Fall?  0   0  Fall Risk Category Calculator  0   0  (RETIRED) Patient Fall Risk Level Low fall risk       Patient at Risk for Falls Due to  No Fall Risks No Fall Risks  No Fall Risks  Fall risk Follow up  Falls evaluation completed Falls evaluation completed  Falls evaluation completed     Data saved with a previous flowsheet row definition       02/22/2023   12:43 PM  Depression screen PHQ 2/9  Decreased Interest   Down, Depressed, Hopeless   PHQ - 2 Score   Altered sleeping   Tired, decreased energy   Change in appetite   Feeling bad or failure about yourself    Trouble concentrating   Moving slowly or fidgety/restless   Suicidal thoughts   PHQ-9 Score   Difficult doing work/chores      Information is confidential and restricted. Go to Review Flowsheets to unlock data.       02/22/2023   12:43 PM 02/09/2022   10:42 AM 12/20/2021   11:41 AM 06/20/2021   10:14 AM  GAD 7 : Generalized Anxiety Score  Nervous, Anxious, on Edge      Control/stop worrying      Worry too much -  different things      Trouble relaxing      Restless      Easily annoyed or irritable      Afraid - awful might happen      Total GAD 7 Score      Anxiety Difficulty         Information is confidential and restricted. Go to Review Flowsheets to unlock data.      Current Medication: Outpatient Encounter Medications as of 10/24/2023  Medication Sig   Accu-Chek Softclix Lancets lancets Use 1 lancet to check glucose once daily and as needed for diabetes   acetaminophen  (TYLENOL ) 500 MG tablet Take 2 tablets (1,000 mg total) by mouth 4 (four) times daily. (Patient taking differently: Take 1,000 mg by mouth every 6 (six) hours as needed.)   albuterol  (VENTOLIN  HFA) 108 (90 Base) MCG/ACT inhaler INHALE 2 PUFFS INTO THE LUNGS EVERY 6 HOURS AS MEEDED FOR WHEEZING OR SHORTNESS OF BREATH   aspirin  EC 81 MG tablet Take 81 mg by mouth daily at 3 pm.    atorvastatin  (LIPITOR) 40  MG tablet Take 1 tablet (40 mg total) by mouth daily.   Blood Glucose Monitoring Suppl (ACCU-CHEK GUIDE ME) w/Device KIT Use as directed. E11.65   budesonide-glycopyrrolate -formoterol  (BREZTRI  AEROSPHERE) 160-9-4.8 MCG/ACT AERO inhaler Inhale 2 puffs into the lungs 2 (two) times daily.   buPROPion  (WELLBUTRIN  XL) 150 MG 24 hr tablet Take 1 tablet (150 mg total) by mouth daily. Take along with 300 mg daily   buPROPion  (WELLBUTRIN  XL) 300 MG 24 hr tablet Take 1 tablet (300 mg total) by mouth daily. Take along with 150 mg daily   Continuous Glucose Sensor (DEXCOM G7 SENSOR) MISC Use one every 10 days for uncontrolled diabetes E11.65   cyclobenzaprine  (FLEXERIL ) 10 MG tablet Take 0.5-1 tablets (5-10 mg total) by mouth 3 (three) times daily as needed for muscle spasms.   dapagliflozin  propanediol (FARXIGA ) 10 MG TABS tablet Take 1 tablet (10 mg total) by mouth daily before breakfast.   DULoxetine  (CYMBALTA ) 30 MG capsule Take 1 capsule (30 mg total) by mouth daily. Take along with 60 mg daily , total of 90 mg daily   DULoxetine  (CYMBALTA ) 60 MG capsule Take 1 capsule (60 mg total) by mouth daily. Take along with 30 mg daily   ezetimibe  (ZETIA ) 10 MG tablet Take 1 tablet by mouth once daily   famotidine  (PEPCID ) 20 MG tablet Take 1 tablet by mouth twice daily   glucose blood (ACCU-CHEK GUIDE) test strip Use 1 test strip to check fasting glucose once daily and as need for diabetes   insulin  glargine, 2 Unit Dial , (TOUJEO  MAX SOLOSTAR) 300 UNIT/ML Solostar Pen Inject 50 Units into the skin daily.   insulin  lispro (HUMALOG  KWIKPEN) 200 UNIT/ML KwikPen INJECT INSULIN  INTO THE SKIN WITH MEALS THREE TIMES DAILY PER SLIDING SCALE PROVIDED. MAX DAILY DOSE IS 42 UNITS (Patient not taking: Reported on 10/08/2023)   Insulin  Pen Needle 32G X 4 MM MISC Use with daily dose insulin  and with sliding scale insulin  as needed.   lidocaine  (LIDODERM ) 5 % Place 1 patch onto the skin daily. Remove & Discard patch within 12 hours  or as directed by MD   Melatonin 10 MG CAPS Take 30 mg by mouth at bedtime.   meloxicam  (MOBIC ) 15 MG tablet Take 15 mg by mouth daily.   metoprolol  tartrate (LOPRESSOR ) 100 MG tablet Take 1 tablet by mouth twice daily   nitroGLYCERIN  (NITROSTAT ) 0.4 MG SL tablet Place  1 tablet (0.4 mg total) under the tongue every 5 (five) minutes x 3 doses as needed for chest pain.   oxyCODONE  (OXY IR/ROXICODONE ) 5 MG immediate release tablet Take 1 tablet (5 mg total) by mouth 2 (two) times daily as needed for severe pain (pain score 7-10).   OZEMPIC , 1 MG/DOSE, 4 MG/3ML SOPN INJECT 1MG   SUBCUTANEOUSLY ONCE A WEEK   pantoprazole  (PROTONIX ) 40 MG tablet Take 1 tablet by mouth once daily   pregabalin  (LYRICA ) 50 MG capsule Take 1 capsule (50 mg total) by mouth 3 (three) times daily. For 7 days then increase to 2 capsules PO 3 times daily   rOPINIRole  (REQUIP ) 0.25 MG tablet Take 1 tablet (0.25 mg total) by mouth 3 (three) times daily.   tamsulosin  (FLOMAX ) 0.4 MG CAPS capsule Take 1 capsule by mouth once daily   temazepam  (RESTORIL ) 30 MG capsule Take 1 capsule (30 mg total) by mouth at bedtime as needed for sleep.   varenicline  (CHANTIX ) 1 MG tablet Take 1 tablet by mouth twice daily   Zoster Vaccine Adjuvanted (SHINGRIX) injection Inject 0.5 mLs into the muscle once as needed for up to 1 dose (vaccination). (Patient not taking: Reported on 10/08/2023)   [DISCONTINUED] BREZTRI  AEROSPHERE 160-9-4.8 MCG/ACT AERO INHALE 2 PUFFS TWICE DAILY   [DISCONTINUED] dapagliflozin  propanediol (FARXIGA ) 10 MG TABS tablet Take 1 tablet (10 mg total) by mouth daily before breakfast.   [DISCONTINUED] traMADol  (ULTRAM ) 50 MG tablet TAKE 1 TABLET BY MOUTH TWICE DAILY AS NEEDED FOR PAIN OF  RIGHT  RIB  CAGE   No facility-administered encounter medications on file as of 10/24/2023.    Surgical History: Past Surgical History:  Procedure Laterality Date    ingrown toenail removal     CHOLECYSTECTOMY N/A 01/23/2019   Procedure:  LAPAROSCOPIC CHOLECYSTECTOMY;  Surgeon: Desiderio Schanz, MD;  Location: [facility] ORS;  Service: General;  Laterality: N/A;   CLAVICLE SURGERY Right    COLONOSCOPY  2017   COLONOSCOPY WITH PROPOFOL  N/A 08/25/2020   Procedure: COLONOSCOPY WITH PROPOFOL ;  Surgeon: Janalyn [name] NOVAK, MD;  Location: [facility] ENDOSCOPY;  Service: Endoscopy;  Laterality: N/A;   ESOPHAGOGASTRODUODENOSCOPY (EGD) WITH PROPOFOL  N/A 08/25/2020   Procedure: ESOPHAGOGASTRODUODENOSCOPY (EGD) WITH PROPOFOL ;  Surgeon: Janalyn [name] NOVAK, MD;  Location: [facility] ENDOSCOPY;  Service: Endoscopy;  Laterality: N/A;   IR THORACENTESIS ASP PLEURAL SPACE W/IMG GUIDE  11/20/2022   LUNG LOBECTOMY Right 2024   LYMPH NODE DISSECTION Right 10/27/2022   Procedure: LYMPH NODE DISSECTION;  Surgeon: Kerrin Elspeth BROCKS, MD;  Location: [facility] OR;  Service: Thoracic;  Laterality: Right;   MOUTH SURGERY     REMOVED ALL TEETH   SHOULDER SURGERY Right    UPPER GI ENDOSCOPY  2017   XI ROBOTIC ASSISTED THORACOSCOPY- SEGMENTECTOMY Right 10/27/2022   Procedure: XI ROBOTIC ASSISTED THORACOSCOPY-RIGHT LOWER LOBE SUPERIOR SEGMENTECTOMY;  Surgeon: Kerrin Elspeth BROCKS, MD;  Location: [facility] OR;  Service: Thoracic;  Laterality: Right;    Medical History: Past Medical History:  Diagnosis Date   Anxiety    Arthritis    NECK   Bronchitis    Cancer (HCC)    R lung, had lobectomy   Coronary artery disease    Depression    Diabetes mellitus without complication (HCC)    Diastasis of rectus abdominis 06/19/2018   Dyspnea    DUE TO GALLBLADDER PER PT   Dysrhythmia    fast hear rate at times. stopped when placed on metoprolol    Emphysema lung [facility])    Family history  of adverse reaction to anesthesia    N/V, TROUBLE BREATHING COMING OUT OF ANESTHESIA   Fatty liver    GERD (gastroesophageal reflux disease)    Headache    MIGRAINES   Hyperlipidemia    Hypertension    Irregular heart beat unk   Myocardial infarction (HCC) 2010   MILD    PTSD (post-traumatic  stress disorder)    Sleep apnea    USES CPAP   Tachycardia    Ventral hernia     Family History: Family History  Problem Relation Age of Onset   Hypertension Mother    Diabetes type II Mother    Diabetes Mother    COPD Father    Lung cancer Father    Heart disease Father    Diabetes Sister    Anxiety disorder Sister    Depression Sister    Diabetes Sister    Anxiety disorder Sister    Depression Sister    Diabetes Sister    Anxiety disorder Sister    Depression Sister    Diabetes Sister    Anxiety disorder Sister    Depression Sister    Diabetes Brother    Anxiety disorder Brother    Depression Brother    Diabetes Maternal Aunt    Colon cancer Maternal Uncle     Social History   Socioeconomic History   Marital status: Divorced    Spouse name: Not on file   Number of children: 4   Years of education: Not on file   Highest education level: 8th grade  Occupational History   Not on file  Tobacco Use   Smoking status: Every Day    Current packs/day: 0.25    Average packs/day: 0.3 packs/day for 40.0 years (10.0 ttl pk-yrs)    Types: Cigarettes   Smokeless tobacco: Former    Types: Chew   Tobacco comments:    2-3 cigarettes per day   Vaping Use   Vaping status: Former  Substance and Sexual Activity   Alcohol use: No    Alcohol/week: 0.0 standard drinks of alcohol   Drug use: No   Sexual activity: Not Currently  Other Topics Concern   Not on file  Social History Narrative   Not on file   Social Drivers of Health   Financial Resource Strain: Low Risk  (08/17/2020)   Overall Financial Resource Strain (CARDIA)    Difficulty of Paying Living Expenses: Not very hard  Food Insecurity: Food Insecurity Present (12/14/2022)   Hunger Vital Sign    Worried About Running Out of Food in the Last Year: Often true    Ran Out of Food in the Last Year: Often true  Transportation Needs: No Transportation Needs (12/14/2022)   PRAPARE - Scientist, research (physical sciences) (Medical): No    Lack of Transportation (Non-Medical): No  Physical Activity: Inactive (05/23/2018)   Exercise Vital Sign    Days of Exercise per Week: 0 days    Minutes of Exercise per Session: 0 min  Stress: Stress Concern Present (05/23/2018)   [employer] of Occupational Health - Occupational Stress Questionnaire    Feeling of Stress : Very much  Social Connections: Unknown (05/23/2018)   Social Connection and Isolation Panel    Frequency of Communication with Friends and Family: Not on file    Frequency of Social Gatherings with Friends and Family: Not on file    Attends Religious Services: Never    Active Member of Clubs or Organizations: No  Attends Banker Meetings: Never    Marital Status: Divorced  Catering manager Violence: Not At Risk (12/14/2022)   Humiliation, Afraid, Rape, and Kick questionnaire    Fear of Current or Ex-Partner: No    Emotionally Abused: No    Physically Abused: No    Sexually Abused: No      Review of Systems  Constitutional:  Negative for activity change, appetite change, chills, fatigue, fever and unexpected weight change.  HENT: Negative.  Negative for congestion, ear pain, rhinorrhea, sore throat and trouble swallowing.   Eyes: Negative.   Respiratory: Negative.  Negative for cough, chest tightness, shortness of breath and wheezing.   Cardiovascular: Negative.  Negative for chest pain and palpitations.  Gastrointestinal: Negative.  Negative for abdominal pain, blood in stool, constipation, diarrhea, nausea and vomiting.  Endocrine: Negative.   Genitourinary: Negative.  Negative for difficulty urinating, dysuria, frequency, hematuria and urgency.  Musculoskeletal:  Positive for arthralgias and joint swelling (left knee). Negative for back pain, myalgias and neck pain.  Skin: Negative.  Negative for rash and wound.  Allergic/Immunologic: Negative.  Negative for immunocompromised state.  Neurological: Negative.   Negative for dizziness, seizures, numbness and headaches.  Hematological: Negative.   Psychiatric/Behavioral: Negative.  Negative for behavioral problems, self-injury and suicidal ideas. The patient is not nervous/anxious.     Vital Signs: BP 108/76   Pulse 87   Temp 98.3 F (36.8 C)   Resp 16   Ht 5' 9 (1.753 m)   Wt 204 lb 12.8 oz (92.9 kg)   SpO2 96%   BMI 30.24 kg/m    Physical Exam Vitals reviewed.  Constitutional:      General: He is not in acute distress.    Appearance: Normal appearance. He is obese. He is not ill-appearing.  HENT:     Head: Normocephalic and atraumatic.     Right Ear: Tympanic membrane, ear canal and external ear normal.     Left Ear: Tympanic membrane, ear canal and external ear normal.     Nose: Nose normal. No congestion or rhinorrhea.     Mouth/Throat:     Mouth: Mucous membranes are moist.     Pharynx: Oropharynx is clear. No oropharyngeal exudate or posterior oropharyngeal erythema.  Eyes:     Extraocular Movements: Extraocular movements intact.     Conjunctiva/sclera: Conjunctivae normal.     Pupils: Pupils are equal, round, and reactive to light.  Neck:     Vascular: No carotid bruit.  Cardiovascular:     Rate and Rhythm: Normal rate and regular rhythm.     Pulses: Normal pulses.     Heart sounds: Normal heart sounds.  Pulmonary:     Effort: Pulmonary effort is normal. No respiratory distress.     Breath sounds: Normal breath sounds. No wheezing.  Abdominal:     General: Bowel sounds are normal. There is no distension (due to constipation).     Palpations: Abdomen is soft. There is no mass.     Tenderness: There is no abdominal tenderness (reports constipation and feeling bloated). There is no guarding or rebound.     Hernia: No hernia is present.  Musculoskeletal:        General: Normal range of motion.     Cervical back: Normal range of motion and neck supple. No rigidity.     Right foot: Normal range of motion. No deformity,  bunion or foot drop.     Left foot: Normal range of motion. No deformity,  bunion or foot drop.     Comments: Decreased ROM left knee and left hip  Feet:     Right foot:     Protective Sensation: 6 sites tested.  4 sites sensed.     Skin integrity: Callus and dry skin present. No ulcer, blister or erythema.     Toenail Condition: Right toenails are abnormally thick.     Left foot:     Protective Sensation: 6 sites tested.  3 sites sensed.     Skin integrity: Callus and dry skin present. No ulcer, blister or erythema.     Toenail Condition: Left toenails are abnormally thick.  Lymphadenopathy:     Cervical: No cervical adenopathy.  Skin:    General: Skin is warm and dry.     Capillary Refill: Capillary refill takes less than 2 seconds.  Neurological:     Mental Status: He is alert and oriented to person, place, and time.  Psychiatric:        Mood and Affect: Mood normal.        Behavior: Behavior normal.        Thought Content: Thought content normal.        Judgment: Judgment normal.        Assessment/Plan: 1. Encounter for subsequent annual wellness visit (AWV) in Medicare patient (Primary) Age-appropriate preventive screenings and vaccinations discussed. Routine labs for health maintenance are up to date. PHM updated.    2. Type 2 diabetes mellitus with other specified complication, with long-term current use of insulin  (HCC) Continue farxiga  as prescribed and other medications as prescribed.  - dapagliflozin  propanediol (FARXIGA ) 10 MG TABS tablet; Take 1 tablet (10 mg total) by mouth daily before breakfast.  Dispense: 90 tablet; Refill: 2  3. Pulmonary emphysema, unspecified emphysema type (HCC) Continue breztri  inhaler as prescribed.   4. Internal hemorrhoids Referred to GI - Ambulatory referral to Gastroenterology  5. Screening for colorectal cancer Referred to GI - Ambulatory referral to Gastroenterology      General Counseling: Cabot verbalizes  understanding of the findings of todays visit and agrees with plan of treatment. I have discussed any further diagnostic evaluation that may be needed or ordered today. We also reviewed his medications today. he has been encouraged to call the office with any questions or concerns that should arise related to todays visit.    Orders Placed This Encounter  Procedures   Ambulatory referral to Gastroenterology    Meds ordered this encounter  Medications   dapagliflozin  propanediol (FARXIGA ) 10 MG TABS tablet    Sig: Take 1 tablet (10 mg total) by mouth daily before breakfast.    Dispense:  90 tablet    Refill:  2   budesonide-glycopyrrolate -formoterol  (BREZTRI  AEROSPHERE) 160-9-4.8 MCG/ACT AERO inhaler    Sig: Inhale 2 puffs into the lungs 2 (two) times daily.    Dispense:  11 g    Refill:  0    Return in about 4 months (around 02/24/2024) for F/U, Recheck A1C, Varnika Butz PCP.   Total time spent:30 Minutes Time spent includes review of chart, medications, test results, and follow up plan with the patient.   [place] Controlled Substance Database was reviewed by me.  This patient was seen by Mardy Maxin, FNP-C in collaboration with Dr. Sigrid Bathe as a part of collaborative care agreement.  Barbarita Hutmacher R. Maxin, MSN, FNP-C Internal medicine

## 2023-10-25 ENCOUNTER — Telehealth: Payer: Self-pay | Admitting: Nurse Practitioner

## 2023-10-25 NOTE — Telephone Encounter (Signed)
 Awaiting 10/24/23 office notes for GI referral-Toni

## 2023-10-29 ENCOUNTER — Ambulatory Visit (INDEPENDENT_AMBULATORY_CARE_PROVIDER_SITE_OTHER): Payer: 59 | Admitting: Internal Medicine

## 2023-10-29 ENCOUNTER — Encounter: Payer: Self-pay | Admitting: Internal Medicine

## 2023-10-29 VITALS — BP 117/72 | HR 96 | Temp 98.7°F | Resp 16 | Ht 69.0 in | Wt 206.2 lb

## 2023-10-29 DIAGNOSIS — R0602 Shortness of breath: Secondary | ICD-10-CM

## 2023-10-29 DIAGNOSIS — J439 Emphysema, unspecified: Secondary | ICD-10-CM | POA: Diagnosis not present

## 2023-10-29 DIAGNOSIS — F17210 Nicotine dependence, cigarettes, uncomplicated: Secondary | ICD-10-CM | POA: Diagnosis not present

## 2023-10-29 NOTE — Patient Instructions (Signed)

## 2023-10-29 NOTE — Progress Notes (Unsigned)
 [facility] [address] [place], [place] [address]  Pulmonary Sleep Medicine   Office Visit Note  Patient Name: MATILDE MARKIE DOB: 01-19-1966 MRN [id number]  Date of Service: 10/29/2023  Complaints/HPI: He is doing well overall. He states he does have some shortness of breath when he bends over which gets better when he sits up straight. He still smokes. He smokes about a half pack. Patient states he has no chest pain noted. He does get short of breath up hills. Patient states has no swelling on the legs. He has not had a recent stress test done.   Office Spirometry Results:     ROS  General: (-) fever, (-) chills, (-) night sweats, (-) weakness Skin: (-) rashes, (-) itching,. Eyes: (-) visual changes, (-) redness, (-) itching. Nose and Sinuses: (-) nasal stuffiness or itchiness, (-) postnasal drip, (-) nosebleeds, (-) sinus trouble. Mouth and Throat: (-) sore throat, (-) hoarseness. Neck: (-) swollen glands, (-) enlarged thyroid , (-) neck pain. Respiratory: -  cough, (-) bloody sputum, + shortness of breath, - wheezing. Cardiovascular: - ankle swelling, (-) chest pain. Lymphatic: (-) lymph node enlargement. Neurologic: (-) numbness, (-) tingling. Psychiatric: (-) anxiety, (-) depression   Current Medication: Outpatient Encounter Medications as of 10/29/2023  Medication Sig   Accu-Chek Softclix Lancets lancets Use 1 lancet to check glucose once daily and as needed for diabetes   acetaminophen  (TYLENOL ) 500 MG tablet Take 2 tablets (1,000 mg total) by mouth 4 (four) times daily. (Patient taking differently: Take 1,000 mg by mouth every 6 (six) hours as needed.)   albuterol  (VENTOLIN  HFA) 108 (90 Base) MCG/ACT inhaler INHALE 2 PUFFS INTO THE LUNGS EVERY 6 HOURS AS MEEDED FOR WHEEZING OR SHORTNESS OF BREATH   aspirin  EC 81 MG tablet Take 81 mg by mouth daily at 3 pm.    atorvastatin  (LIPITOR) 40 MG tablet Take 1 tablet (40 mg total) by mouth daily.   Blood  Glucose Monitoring Suppl (ACCU-CHEK GUIDE ME) w/Device KIT Use as directed. E11.65   budesonide-glycopyrrolate -formoterol  (BREZTRI  AEROSPHERE) 160-9-4.8 MCG/ACT AERO inhaler Inhale 2 puffs into the lungs 2 (two) times daily.   buPROPion  (WELLBUTRIN  XL) 150 MG 24 hr tablet Take 1 tablet (150 mg total) by mouth daily. Take along with 300 mg daily   buPROPion  (WELLBUTRIN  XL) 300 MG 24 hr tablet Take 1 tablet (300 mg total) by mouth daily. Take along with 150 mg daily   Continuous Glucose Sensor (DEXCOM G7 SENSOR) MISC Use one every 10 days for uncontrolled diabetes E11.65   cyclobenzaprine  (FLEXERIL ) 10 MG tablet Take 0.5-1 tablets (5-10 mg total) by mouth 3 (three) times daily as needed for muscle spasms.   dapagliflozin  propanediol (FARXIGA ) 10 MG TABS tablet Take 1 tablet (10 mg total) by mouth daily before breakfast.   DULoxetine  (CYMBALTA ) 30 MG capsule Take 1 capsule (30 mg total) by mouth daily. Take along with 60 mg daily , total of 90 mg daily   DULoxetine  (CYMBALTA ) 60 MG capsule Take 1 capsule (60 mg total) by mouth daily. Take along with 30 mg daily   ezetimibe  (ZETIA ) 10 MG tablet Take 1 tablet by mouth once daily   famotidine  (PEPCID ) 20 MG tablet Take 1 tablet by mouth twice daily   glucose blood (ACCU-CHEK GUIDE) test strip Use 1 test strip to check fasting glucose once daily and as need for diabetes   insulin  glargine, 2 Unit Dial , (TOUJEO  MAX SOLOSTAR) 300 UNIT/ML Solostar Pen Inject 50 Units into the skin  daily.   insulin  lispro (HUMALOG  KWIKPEN) 200 UNIT/ML KwikPen INJECT INSULIN  INTO THE SKIN WITH MEALS THREE TIMES DAILY PER SLIDING SCALE PROVIDED. MAX DAILY DOSE IS 42 UNITS   Insulin  Pen Needle 32G X 4 MM MISC Use with daily dose insulin  and with sliding scale insulin  as needed.   lidocaine  (LIDODERM ) 5 % Place 1 patch onto the skin daily. Remove & Discard patch within 12 hours or as directed by MD   Melatonin 10 MG CAPS Take 30 mg by mouth at bedtime.   meloxicam  (MOBIC ) 15 MG  tablet Take 15 mg by mouth daily.   metoprolol  tartrate (LOPRESSOR ) 100 MG tablet Take 1 tablet by mouth twice daily   nitroGLYCERIN  (NITROSTAT ) 0.4 MG SL tablet Place 1 tablet (0.4 mg total) under the tongue every 5 (five) minutes x 3 doses as needed for chest pain.   oxyCODONE  (OXY IR/ROXICODONE ) 5 MG immediate release tablet Take 1 tablet (5 mg total) by mouth 2 (two) times daily as needed for severe pain (pain score 7-10).   OZEMPIC , 1 MG/DOSE, 4 MG/3ML SOPN INJECT 1MG   SUBCUTANEOUSLY ONCE A WEEK   pantoprazole  (PROTONIX ) 40 MG tablet Take 1 tablet by mouth once daily   pregabalin  (LYRICA ) 50 MG capsule Take 1 capsule (50 mg total) by mouth 3 (three) times daily. For 7 days then increase to 2 capsules PO 3 times daily   rOPINIRole  (REQUIP ) 0.25 MG tablet Take 1 tablet (0.25 mg total) by mouth 3 (three) times daily.   tamsulosin  (FLOMAX ) 0.4 MG CAPS capsule Take 1 capsule by mouth once daily   temazepam  (RESTORIL ) 30 MG capsule Take 1 capsule (30 mg total) by mouth at bedtime as needed for sleep.   varenicline  (CHANTIX ) 1 MG tablet Take 1 tablet by mouth twice daily   Zoster Vaccine Adjuvanted (SHINGRIX) injection Inject 0.5 mLs into the muscle once as needed for up to 1 dose (vaccination).   No facility-administered encounter medications on file as of 10/29/2023.    Surgical History: Past Surgical History:  Procedure Laterality Date    ingrown toenail removal     CHOLECYSTECTOMY N/A 01/23/2019   Procedure: LAPAROSCOPIC CHOLECYSTECTOMY;  Surgeon: Desiderio Schanz, MD;  Location: [facility] ORS;  Service: General;  Laterality: N/A;   CLAVICLE SURGERY Right    COLONOSCOPY  2017   COLONOSCOPY WITH PROPOFOL  N/A 08/25/2020   Procedure: COLONOSCOPY WITH PROPOFOL ;  Surgeon: Janalyn [name] NOVAK, MD;  Location: [facility] ENDOSCOPY;  Service: Endoscopy;  Laterality: N/A;   ESOPHAGOGASTRODUODENOSCOPY (EGD) WITH PROPOFOL  N/A 08/25/2020   Procedure: ESOPHAGOGASTRODUODENOSCOPY (EGD) WITH PROPOFOL ;  Surgeon:  Janalyn [name] NOVAK, MD;  Location: [facility] ENDOSCOPY;  Service: Endoscopy;  Laterality: N/A;   IR THORACENTESIS ASP PLEURAL SPACE W/IMG GUIDE  11/20/2022   LUNG LOBECTOMY Right 2024   LYMPH NODE DISSECTION Right 10/27/2022   Procedure: LYMPH NODE DISSECTION;  Surgeon: Kerrin Elspeth BROCKS, MD;  Location: [facility] OR;  Service: Thoracic;  Laterality: Right;   MOUTH SURGERY     REMOVED ALL TEETH   SHOULDER SURGERY Right    UPPER GI ENDOSCOPY  2017   XI ROBOTIC ASSISTED THORACOSCOPY- SEGMENTECTOMY Right 10/27/2022   Procedure: XI ROBOTIC ASSISTED THORACOSCOPY-RIGHT LOWER LOBE SUPERIOR SEGMENTECTOMY;  Surgeon: Kerrin Elspeth BROCKS, MD;  Location: [facility] OR;  Service: Thoracic;  Laterality: Right;    Medical History: Past Medical History:  Diagnosis Date   Anxiety    Arthritis    NECK   Bronchitis    Cancer (HCC)    R lung, had lobectomy  Coronary artery disease    Depression    Diabetes mellitus without complication (HCC)    Diastasis of rectus abdominis 06/19/2018   Dyspnea    DUE TO GALLBLADDER PER PT   Dysrhythmia    fast hear rate at times. stopped when placed on metoprolol    Emphysema lung (HCC)    Family history of adverse reaction to anesthesia    N/V, TROUBLE BREATHING COMING OUT OF ANESTHESIA   Fatty liver    GERD (gastroesophageal reflux disease)    Headache    MIGRAINES   Hyperlipidemia    Hypertension    Irregular heart beat unk   Myocardial infarction (HCC) 2010   MILD    PTSD (post-traumatic stress disorder)    Sleep apnea    USES CPAP   Tachycardia    Ventral hernia     Family History: Family History  Problem Relation Age of Onset   Hypertension Mother    Diabetes type II Mother    Diabetes Mother    COPD Father    Lung cancer Father    Heart disease Father    Diabetes Sister    Anxiety disorder Sister    Depression Sister    Diabetes Sister    Anxiety disorder Sister    Depression Sister    Diabetes Sister    Anxiety disorder Sister     Depression Sister    Diabetes Sister    Anxiety disorder Sister    Depression Sister    Diabetes Brother    Anxiety disorder Brother    Depression Brother    Diabetes Maternal Aunt    Colon cancer Maternal Uncle     Social History: Social History   Socioeconomic History   Marital status: Divorced    Spouse name: Not on file   Number of children: 4   Years of education: Not on file   Highest education level: 8th grade  Occupational History   Not on file  Tobacco Use   Smoking status: Every Day    Current packs/day: 0.25    Average packs/day: 0.3 packs/day for 40.0 years (10.0 ttl pk-yrs)    Types: Cigarettes   Smokeless tobacco: Former    Types: Chew   Tobacco comments:    2-3 cigarettes per day   Vaping Use   Vaping status: Former  Substance and Sexual Activity   Alcohol use: No    Alcohol/week: 0.0 standard drinks of alcohol   Drug use: No   Sexual activity: Not Currently  Other Topics Concern   Not on file  Social History Narrative   Not on file   Social Drivers of Health   Financial Resource Strain: Low Risk  (08/17/2020)   Overall Financial Resource Strain (CARDIA)    Difficulty of Paying Living Expenses: Not very hard  Food Insecurity: Food Insecurity Present (12/14/2022)   Hunger Vital Sign    Worried About Running Out of Food in the Last Year: Often true    Ran Out of Food in the Last Year: Often true  Transportation Needs: No Transportation Needs (12/14/2022)   PRAPARE - Administrator, Civil Service (Medical): No    Lack of Transportation (Non-Medical): No  Physical Activity: Inactive (05/23/2018)   Exercise Vital Sign    Days of Exercise per Week: 0 days    Minutes of Exercise per Session: 0 min  Stress: Stress Concern Present (05/23/2018)   [employer] of Occupational Health - Occupational Stress Questionnaire    Feeling  of Stress : Very much  Social Connections: Unknown (05/23/2018)   Social Connection and Isolation Panel     Frequency of Communication with Friends and Family: Not on file    Frequency of Social Gatherings with Friends and Family: Not on file    Attends Religious Services: Never    Active Member of Clubs or Organizations: No    Attends Banker Meetings: Never    Marital Status: Divorced  Catering manager Violence: Not At Risk (12/14/2022)   Humiliation, Afraid, Rape, and Kick questionnaire    Fear of Current or Ex-Partner: No    Emotionally Abused: No    Physically Abused: No    Sexually Abused: No    Vital Signs: Blood pressure 117/72, pulse 96, temperature 98.7 F (37.1 C), resp. rate 16, height 5' 9 (1.753 m), weight 206 lb 3.2 oz (93.5 kg), SpO2 98%.  Examination: General Appearance: The patient is well-developed, well-nourished, and in no distress. Skin: Gross inspection of skin unremarkable. Head: normocephalic, no gross deformities. Eyes: no gross deformities noted. ENT: ears appear grossly normal no exudates. Neck: Supple. No thyromegaly. No LAD. Respiratory: no rhonchi noted. Cardiovascular: Normal S1 and S2 without murmur or rub. Extremities: No cyanosis. pulses are equal. Neurologic: Alert and oriented. No involuntary movements.  LABS: Recent Results (from the past 2160 hours)  POCT glycosylated hemoglobin (Hb A1C)     Status: Abnormal   Collection Time: 08/08/23  2:02 PM  Result Value Ref Range   Hemoglobin A1C 6.7 (A) 4.0 - 5.6 %   HbA1c POC (<> result, manual entry)     HbA1c, POC (prediabetic range)     HbA1c, POC (controlled diabetic range)    Lipase, blood     Status: None   Collection Time: 09/15/23  4:59 PM  Result Value Ref Range   Lipase 35 11 - 51 U/L    Comment: Performed at [facility], [address] [place], [place] [address]  Comprehensive metabolic panel     Status: Abnormal   Collection Time: 09/15/23  4:59 PM  Result Value Ref Range   Sodium 140 135 - 145 mmol/L   Potassium 3.6 3.5 - 5.1 mmol/L   Chloride 104 98 - 111  mmol/L   CO2 24 22 - 32 mmol/L   Glucose, Bld 126 (H) 70 - 99 mg/dL    Comment: Glucose reference range applies only to samples taken after fasting for at least 8 hours.   BUN 13 6 - 20 mg/dL   Creatinine, Ser 8.78 0.61 - 1.24 mg/dL   Calcium  9.3 8.9 - 10.3 mg/dL   Total Protein 7.8 6.5 - 8.1 g/dL   Albumin  4.2 3.5 - 5.0 g/dL   AST 22 15 - 41 U/L   ALT 17 0 - 44 U/L   Alkaline Phosphatase 100 38 - 126 U/L   Total Bilirubin 0.4 0.0 - 1.2 mg/dL   GFR, Estimated >39 >39 mL/min    Comment: (NOTE) Calculated using the CKD-EPI Creatinine Equation (2021)    Anion gap 12 5 - 15    Comment: Performed at [facility], [address] [place], [place] [address]  CBC     Status: None   Collection Time: 09/15/23  4:59 PM  Result Value Ref Range   WBC 8.1 4.0 - 10.5 K/uL   RBC 5.07 4.22 - 5.81 MIL/uL   Hemoglobin 15.3 13.0 - 17.0 g/dL   HCT 55.1 60.9 - 47.9 %   MCV 88.4 80.0 -  100.0 fL   MCH 30.2 26.0 - 34.0 pg   MCHC 34.2 30.0 - 36.0 g/dL   RDW 87.5 88.4 - 84.4 %   Platelets 211 150 - 400 K/uL   nRBC 0.0 0.0 - 0.2 %    Comment: Performed at [facility], [address] [place], [place] [address]  Troponin I (High Sensitivity)     Status: None   Collection Time: 09/15/23  5:37 PM  Result Value Ref Range   Troponin I (High Sensitivity) 6 <18 ng/L    Comment: (NOTE) Elevated high sensitivity troponin I (hsTnI) values and significant  changes across serial measurements may suggest ACS but many other  chronic and acute conditions are known to elevate hsTnI results.  Refer to the Links section for chest pain algorithms and additional  guidance. Performed at [facility], [address] [place], [place] [address]   Urinalysis, w/ Reflex to Culture (Infection Suspected) -Urine, Clean Catch     Status: Abnormal   Collection Time: 09/15/23  5:38 PM  Result Value Ref Range   Specimen Source URINE, CLEAN CATCH    Color, Urine YELLOW (A) YELLOW   APPearance  CLEAR (A) CLEAR   Specific Gravity, Urine 1.033 (H) 1.005 - 1.030   pH 5.0 5.0 - 8.0   Glucose, UA >=500 (A) NEGATIVE mg/dL   Hgb urine dipstick NEGATIVE NEGATIVE   Bilirubin Urine NEGATIVE NEGATIVE   Ketones, ur NEGATIVE NEGATIVE mg/dL   Protein, ur NEGATIVE NEGATIVE mg/dL   Nitrite NEGATIVE NEGATIVE   Leukocytes,Ua NEGATIVE NEGATIVE   RBC / HPF 0-5 0 - 5 RBC/hpf   WBC, UA 0-5 0 - 5 WBC/hpf    Comment:        Reflex urine culture not performed if WBC <=10, OR if Squamous epithelial cells >5. If Squamous epithelial cells >5 suggest recollection.    Bacteria, UA NONE SEEN NONE SEEN   Squamous Epithelial / HPF 0 0 - 5 /HPF   Mucus PRESENT     Comment: Performed at [facility], [address] [place], [place] [address]  Troponin I (High Sensitivity)     Status: None   Collection Time: 09/15/23  8:11 PM  Result Value Ref Range   Troponin I (High Sensitivity) 6 <18 ng/L    Comment: (NOTE) Elevated high sensitivity troponin I (hsTnI) values and significant  changes across serial measurements may suggest ACS but many other  chronic and acute conditions are known to elevate hsTnI results.  Refer to the Links section for chest pain algorithms and additional  guidance. Performed at [facility], [address] [place], [place] [address]   HM DIABETES EYE EXAM     Status: None   Collection Time: 09/28/23 12:00 AM  Result Value Ref Range   HM Diabetic Eye Exam No Retinopathy No Retinopathy    Radiology: CT ABDOMEN PELVIS W CONTRAST Result Date: 09/15/2023 CLINICAL DATA:  Right flank and abdominal pain. History of lung cancer with a right basilar lobectomy. EXAM: CT ABDOMEN AND PELVIS WITH CONTRAST TECHNIQUE: Multidetector CT imaging of the abdomen and pelvis was performed using the standard protocol following bolus administration of intravenous contrast. RADIATION DOSE REDUCTION: This exam was performed according to the departmental dose-optimization program  which includes automated exposure control, adjustment of the mA and/or kV according to patient size and/or use of iterative reconstruction technique. CONTRAST:  OMNIPAQUE  IOHEXOL  350 MG/ML SOLN COMPARISON:  01/24/2023, 08/11/2020. FINDINGS: Lower chest: Stable atelectasis or scarring  is noted in the right lower lobe. There is a stable 3 mm nodule in the left lower lobe, axial image 23. Hepatobiliary: No focal liver abnormality is seen. There is mild fatty infiltration of the liver. The gallbladder is surgically absent. No biliary ductal dilatation is seen. Pancreas: Unremarkable. No pancreatic ductal dilatation or surrounding inflammatory changes. Spleen: Normal in size without focal abnormality. Adrenals/Urinary Tract: The adrenal glands are within normal limits. The kidneys enhance symmetrically. A cyst is present in the upper pole the left kidney. No renal or ureteral calculus or obstructive uropathy bilaterally. The bladder is unremarkable. Stomach/Bowel: Stomach is within normal limits. Appendix appears normal. No evidence of bowel wall thickening, distention, or inflammatory changes. No free air or pneumatosis is seen. Vascular/Lymphatic: Aortic atherosclerosis. No enlarged abdominal or pelvic lymph nodes. Reproductive: Prostate is unremarkable. Other: No abdominopelvic ascites. Fat containing inguinal hernias are noted bilaterally. There is a small fat containing umbilical hernia. Musculoskeletal: Degenerative changes are present in the thoracolumbar spine, most pronounced at L3-L4 and L4-L5 with associated spinal stenosis. No acute osseous abnormality is seen. IMPRESSION: 1. No acute intra-abdominal process. 2. Stable scarring at the right lung base. 3. 3 mm left lower lobe pulmonary nodule, unchanged from 2022 and likely benign. 4. Hepatic steatosis. 5. Aortic atherosclerosis. Electronically Signed   By: Leita Birmingham M.D.   On: 09/15/2023 19:18   CT Angio Chest PE W/Cm &/Or Wo Cm Result Date:  09/15/2023 CLINICAL DATA:  Right-sided pain. EXAM: CT ANGIOGRAPHY CHEST WITH CONTRAST TECHNIQUE: Multidetector CT imaging of the chest was performed using the standard protocol during bolus administration of intravenous contrast. Multiplanar CT image reconstructions and MIPs were obtained to evaluate the vascular anatomy. RADIATION DOSE REDUCTION: This exam was performed according to the departmental dose-optimization program which includes automated exposure control, adjustment of the mA and/or kV according to patient size and/or use of iterative reconstruction technique. CONTRAST:  OMNIPAQUE  IOHEXOL  350 MG/ML SOLN COMPARISON:  April 30, 2023 and Aug 31, 2022 FINDINGS: Cardiovascular: There is mild calcification of the aortic arch without evidence of aortic aneurysm. Satisfactory opacification of the pulmonary arteries to the segmental level. No evidence of pulmonary embolism. Normal heart size with mild to moderate severity coronary artery calcification. No pericardial effusion. Mediastinum/Nodes: No enlarged mediastinal, hilar, or axillary lymph nodes. Thyroid  gland, trachea, and esophagus demonstrate no significant findings. Lungs/Pleura: There is mild emphysematous lung disease with mild, stable linear scarring and/or atelectasis seen within the right lower lobe. Surgical sutures are also seen within the posterior aspect of the right lung consistent with the patient's history of right lower lobe superior segmentectomy. A stable, likely benign 4 mm posterolateral left lower lobe pulmonary nodule is seen (axial CT image 78, CT series 3). No hypermetabolic activity is seen within this study on the patient's most recent nuclear medicine PET/CT (Sep 20, 2022). No pleural effusion or pneumothorax is identified. Upper Abdomen: Surgical clips are seen within the gallbladder fossa. Musculoskeletal: A metallic density fixation plate and screws are seen overlying the distal right clavicle. Multilevel degenerative  changes seen throughout the thoracic spine. Review of the MIP images confirms the above findings. IMPRESSION: 1. No evidence of pulmonary embolism or other acute intrathoracic process. 2. Mild emphysematous lung disease with mild, stable linear scarring and/or atelectasis within the right lower lobe. 3. Stable, likely benign 4 mm left lower lobe pulmonary nodule. 4. Evidence of prior right lower lobe superior segmentectomy. 5. Aortic atherosclerosis. Electronically Signed   By: Suzen Dwane HERO.D.  On: 09/15/2023 19:17    No results found.  No results found.  Assessment and Plan: Patient Active Problem List   Diagnosis Date Noted   Lumbar radiculopathy, chronic 09/25/2023   Spinal stenosis of lumbar region with neurogenic claudication 09/25/2023   Diabetic polyneuropathy associated with type 2 diabetes mellitus (HCC) 09/05/2023   Diabetes mellitus (HCC) 09/05/2023   Chronic diastolic heart failure (HCC) 09/05/2023   Obesity, morbid (HCC) 09/05/2023   Primary adenocarcinoma of right lung (HCC) 11/21/2022   Type 2 diabetes mellitus with hyperglycemia, with long-term current use of insulin  (HCC) 11/21/2022   Pleural effusion on right 11/20/2022   S/P robot-assisted Video Assisted Thoracoscopy with Right Lower Lobe Superior Segmentectomy, Node Dissection, and Intercostal Nerve Block 10/28/2022   Lung nodule 10/27/2022   Right lower lobe pulmonary nodule 10/27/2022   Aortic atherosclerosis (HCC) 12/07/2021   Abnormal LFTs 04/22/2021   Insomnia due to medical condition 12/13/2020   At risk for prolonged QT interval syndrome 12/13/2020   Hypersomnia with sleep apnea 12/13/2020   Mixed hyperlipidemia 09/26/2020   Pulmonary emphysema (HCC) 09/26/2020   Barrett's esophagus without dysplasia    Gastric erythema    Special screening for malignant neoplasms, colon    Polyp of colon    MDD (major depressive disorder), recurrent episode, mild (HCC) 06/23/2020   Noncompliance with diabetes  treatment 04/12/2020   Angina pectoris (HCC) 04/12/2020   MDD (major depressive disorder), recurrent, in full remission (HCC) 01/05/2020   MDD (major depressive disorder), recurrent, in partial remission (HCC) 10/30/2019   Tobacco use disorder 07/02/2019   Right upper quadrant pain    Encounter for other preprocedural examination 01/20/2019   PTSD (post-traumatic stress disorder) 01/20/2019   MDD (major depressive disorder), recurrent episode, moderate (HCC) 01/20/2019   Insomnia due to mental disorder 01/20/2019   Calculus of gallbladder with acute on chronic cholecystitis without obstruction 11/15/2018   Generalized abdominal pain 10/08/2018   Local superficial swelling, mass or lump 10/08/2018   Enterocolitis 08/16/2018   Left upper quadrant abdominal pain of unknown etiology 08/16/2018   Diastasis recti 06/19/2018   Anxiety disorder 04/01/2018   Clotted blood in stool 12/31/2017   Diarrhea 12/31/2017   Encounter for general adult medical examination with abnormal findings 10/27/2017   Uncontrolled type 2 diabetes mellitus with hyperglycemia (HCC) 10/27/2017   Onychomycosis with ingrown toenail 10/27/2017   Dysuria 10/27/2017   Uncontrolled type 2 diabetes mellitus with hypoglycemia without coma (HCC) 07/08/2017   Shortness of breath 07/08/2017   Cigarette nicotine  dependence without complication 07/08/2017   Influenza A 05/30/2017   OSA (obstructive sleep apnea) 08/12/2015   Cervical spondylosis 06/14/2015   Hypertension associated with diabetes (HCC) 04/08/2015   Depression 04/08/2015   Gastroesophageal reflux disease without esophagitis 01/05/2015   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 Pulmonary emphysema, unspecified emphysema type (HCC) (Primary) Patient has severe COPD emphysema continues to smoke unfortunately.  He is complaining about having shortness of breath.  Needs to quit smoking.  Continue with using inhalers.  Also will get a cardiac  evaluation for the shortness of breath make certain that he does not have coronary artery disease.  2. SOB (shortness of breath) Patient has ongoing shortness of breath need to rule out cardiac issues will get a nuclear medicine Myoview  done - NM Myocar Multi W/Spect W/Wall Motion / EF; Future  3. Cigarette smoker Stop smoking reiterated the importance of compliance with these recommendations.  General Counseling: I have discussed the  findings of the evaluation and examination with Carvin.  I have also discussed any further diagnostic evaluation thatmay be needed or ordered today. Tesla verbalizes understanding of the findings of todays visit. We also reviewed his medications today and discussed drug interactions and side effects including but not limited excessive drowsiness and altered mental states. We also discussed that there is always a risk not just to him but also people around him. he has been encouraged to call the office with any questions or concerns that should arise related to todays visit.  No orders of the defined types were placed in this encounter.    Time spent: 39  I have personally obtained a history, examined the patient, evaluated laboratory and imaging results, formulated the assessment and plan and placed orders.    Elfreda DELENA Bathe, MD [facility] Pulmonary and Critical Care Sleep medicine

## 2023-10-30 ENCOUNTER — Encounter: Payer: Self-pay | Admitting: Psychiatry

## 2023-10-30 ENCOUNTER — Ambulatory Visit: Admitting: Psychiatry

## 2023-10-30 VITALS — BP 118/72 | HR 94 | Temp 98.4°F | Ht 68.0 in | Wt 207.0 lb

## 2023-10-30 DIAGNOSIS — G4701 Insomnia due to medical condition: Secondary | ICD-10-CM | POA: Diagnosis not present

## 2023-10-30 DIAGNOSIS — F4310 Post-traumatic stress disorder, unspecified: Secondary | ICD-10-CM | POA: Diagnosis not present

## 2023-10-30 DIAGNOSIS — F3342 Major depressive disorder, recurrent, in full remission: Secondary | ICD-10-CM | POA: Diagnosis not present

## 2023-10-30 DIAGNOSIS — F17200 Nicotine dependence, unspecified, uncomplicated: Secondary | ICD-10-CM

## 2023-10-30 DIAGNOSIS — F418 Other specified anxiety disorders: Secondary | ICD-10-CM | POA: Diagnosis not present

## 2023-10-30 LAB — PULMONARY FUNCTION TEST

## 2023-10-30 MED ORDER — ROPINIROLE HCL 0.25 MG PO TABS: 0.2500 mg | ORAL_TABLET | Freq: Three times a day (TID) | ORAL | 1 refills | Status: DC

## 2023-10-30 NOTE — Progress Notes (Signed)
 [facility] MD OP Progress Note  10/30/2023 1:58 PM Raymond [name]  MRN:  [id number]  Chief Complaint:  Chief Complaint  Patient presents with   Follow-up   Depression   Anxiety   Insomnia   Medication Refill   Discussed the use of AI scribe software for clinical note transcription with the patient, who gave verbal consent to proceed.  History of Present Illness Raymond [name] is a 58 year old Caucasian male lives in [place], divorced, has a history of MDD, PTSD, insomnia, anxiety, tobacco use disorder, hyperlipidemia, diabetes mellitus, hepatic steatosis, lung cancer with right lower lobe superior segmentectomy  (10/27/2022 ) was evaluated in office today for a follow-up appointment.  He experiences ongoing back pain due to degenerative disc disease at the L3-L4 and L4-L5 levels. The pain has increased since he ran out of Percocet, which he was taking at night to manage the pain and improve sleep. He is scheduled to see a pain management specialist on the 16th of this month.  He has a history of lung cancer and recently visited his provider, who noted that his lungs are in better condition than expected. He is scheduled for a follow-up CT scan on the 14th of this month and will see his oncologist thereafter.  He experiences sleep disturbances, which he attributes to back pain and the discomfort of using a CPAP machine. He is considering alternative treatments for his sleep apnea due to the inconvenience of the CPAP mask.  He continues to take medication for restless leg syndrome, Requip  which was initiated last visit, which has been somewhat effective, although he experienced symptoms last night, possibly due to overexertion while working on his truck. He is currently taking bupropion  300 mg plus 150 mg, Cymbalta  30 mg plus 60 mg, and temazepam , which was recently refilled.  Denies side effects.  He smokes about half a pack of cigarettes per day and is considering using nicotine  patches or  gum to aid in smoking cessation. Stress often triggers increased smoking.  Denies thoughts of self-harm or harm to others.    Visit Diagnosis:    ICD-10-CM   1. MDD (major depressive disorder), recurrent, in full remission (HCC)  F33.42     2. PTSD (post-traumatic stress disorder)  F43.10     3. Insomnia due to medical condition  G47.01 rOPINIRole  (REQUIP ) 0.25 MG tablet   Pain,OSA, Periodic limb movements    4. Other specified anxiety disorders  F41.8    Generalized anxiety not occurring more days than not    5. Tobacco use disorder  F17.200       Past Psychiatric History: I have reviewed past psychiatric history from progress note on 05/23/2018.  Past trials of Tegretol , doxepin, Seroquel , mirtazapine , Belsomra , Lunesta, Ativan , risperidone, Lexapro .  Past Medical History:  Past Medical History:  Diagnosis Date   Anxiety    Arthritis    NECK   Bronchitis    Cancer (HCC)    R lung, had lobectomy   Coronary artery disease    Depression    Diabetes mellitus without complication (HCC)    Diastasis of rectus abdominis 06/19/2018   Dyspnea    DUE TO GALLBLADDER PER PT   Dysrhythmia    fast hear rate at times. stopped when placed on metoprolol    Emphysema lung (HCC)    Family history of adverse reaction to anesthesia    N/V, TROUBLE BREATHING COMING OUT OF ANESTHESIA   Fatty liver    GERD (gastroesophageal reflux disease)  Headache    MIGRAINES   Hyperlipidemia    Hypertension    Irregular heart beat unk   Myocardial infarction (HCC) 2010   MILD    PTSD (post-traumatic stress disorder)    Sleep apnea    USES CPAP   Tachycardia    Ventral hernia     Past Surgical History:  Procedure Laterality Date    ingrown toenail removal     CHOLECYSTECTOMY N/A 01/23/2019   Procedure: LAPAROSCOPIC CHOLECYSTECTOMY;  Surgeon: Desiderio Schanz, MD;  Location: [facility] ORS;  Service: General;  Laterality: N/A;   CLAVICLE SURGERY Right    COLONOSCOPY  2017   COLONOSCOPY WITH  PROPOFOL  N/A 08/25/2020   Procedure: COLONOSCOPY WITH PROPOFOL ;  Surgeon: Janalyn [name] NOVAK, MD;  Location: [facility] ENDOSCOPY;  Service: Endoscopy;  Laterality: N/A;   ESOPHAGOGASTRODUODENOSCOPY (EGD) WITH PROPOFOL  N/A 08/25/2020   Procedure: ESOPHAGOGASTRODUODENOSCOPY (EGD) WITH PROPOFOL ;  Surgeon: Janalyn [name] NOVAK, MD;  Location: [facility] ENDOSCOPY;  Service: Endoscopy;  Laterality: N/A;   IR THORACENTESIS ASP PLEURAL SPACE W/IMG GUIDE  11/20/2022   LUNG LOBECTOMY Right 2024   LYMPH NODE DISSECTION Right 10/27/2022   Procedure: LYMPH NODE DISSECTION;  Surgeon: Kerrin Elspeth BROCKS, MD;  Location: [facility] OR;  Service: Thoracic;  Laterality: Right;   MOUTH SURGERY     REMOVED ALL TEETH   SHOULDER SURGERY Right    UPPER GI ENDOSCOPY  2017   XI ROBOTIC ASSISTED THORACOSCOPY- SEGMENTECTOMY Right 10/27/2022   Procedure: XI ROBOTIC ASSISTED THORACOSCOPY-RIGHT LOWER LOBE SUPERIOR SEGMENTECTOMY;  Surgeon: Kerrin Elspeth BROCKS, MD;  Location[facility] OR;  Service: Thoracic;  Laterality: Right;    Family Psychiatric History: I reviewed family psychiatric history from progress note on 05/23/2018.  Family History:  Family History  Problem Relation Age of Onset   Hypertension Mother    Diabetes type II Mother    Diabetes Mother    COPD Father    Lung cancer Father    Heart disease Father    Diabetes Sister    Anxiety disorder Sister    Depression Sister    Diabetes Sister    Anxiety disorder Sister    Depression Sister    Diabetes Sister    Anxiety disorder Sister    Depression Sister    Diabetes Sister    Anxiety disorder Sister    Depression Sister    Diabetes Brother    Anxiety disorder Brother    Depression Brother    Diabetes Maternal Aunt    Colon cancer Maternal Uncle     Social History: I have reviewed social history from progress note on 05/23/2018. Social History   Socioeconomic History   Marital status: Divorced    Spouse name: Not on file   Number of children: 4   Years of  education: Not on file   Highest education level: 8th grade  Occupational History   Not on file  Tobacco Use   Smoking status: Every Day    Current packs/day: 0.25    Average packs/day: 0.3 packs/day for 40.0 years (10.0 ttl pk-yrs)    Types: Cigarettes   Smokeless tobacco: Former    Types: Chew   Tobacco comments:    Reports smoking 1/2 pack a day and trying to cut back   Vaping Use   Vaping status: Former  Substance and Sexual Activity   Alcohol use: No    Alcohol/week: 0.0 standard drinks of alcohol   Drug use: No   Sexual activity: Not Currently  Other Topics Concern  Not on file  Social History Narrative   Not on file   Social Drivers of Health   Financial Resource Strain: Low Risk  (08/17/2020)   Overall Financial Resource Strain (CARDIA)    Difficulty of Paying Living Expenses: Not very hard  Food Insecurity: Food Insecurity Present (12/14/2022)   Hunger Vital Sign    Worried About Running Out of Food in the Last Year: Often true    Ran Out of Food in the Last Year: Often true  Transportation Needs: No Transportation Needs (12/14/2022)   PRAPARE - Administrator, Civil Service (Medical): No    Lack of Transportation (Non-Medical): No  Physical Activity: Inactive (05/23/2018)   Exercise Vital Sign    Days of Exercise per Week: 0 days    Minutes of Exercise per Session: 0 min  Stress: Stress Concern Present (05/23/2018)   [employer] of Occupational Health - Occupational Stress Questionnaire    Feeling of Stress : Very much  Social Connections: Unknown (05/23/2018)   Social Connection and Isolation Panel    Frequency of Communication with Friends and Family: Not on file    Frequency of Social Gatherings with Friends and Family: Not on file    Attends Religious Services: Never    Active Member of Clubs or Organizations: No    Attends Banker Meetings: Never    Marital Status: Divorced    Allergies:  Allergies  Allergen Reactions    Onion Anaphylaxis   Norco [Hydrocodone-Acetaminophen ] Itching   Nickel Rash   Nicoderm [Nicotine ] Rash    Metabolic Disorder Labs: Lab Results  Component Value Date   HGBA1C 6.7 (A) 08/08/2023   MPG 205.86 10/25/2022   MPG 294.83 05/31/2017   No results found for: PROLACTIN Lab Results  Component Value Date   CHOL 151 11/11/2021   TRIG 251 (H) 11/11/2021   HDL 34 (L) 11/11/2021   CHOLHDL 4.4 11/11/2021   VLDL 26 12/09/2012   LDLCALC 76 11/11/2021   LDLCALC 153 (H) 08/17/2020   Lab Results  Component Value Date   TSH 1.890 08/17/2020   TSH 4.120 06/10/2018    Therapeutic Level Labs: No results found for: LITHIUM No results found for: VALPROATE No results found for: CBMZ  Current Medications: Current Outpatient Medications  Medication Sig Dispense Refill   Accu-Chek Softclix Lancets lancets Use 1 lancet to check glucose once daily and as needed for diabetes 100 each 12   acetaminophen  (TYLENOL ) 500 MG tablet Take 2 tablets (1,000 mg total) by mouth 4 (four) times daily. (Patient taking differently: Take 1,000 mg by mouth every 6 (six) hours as needed.) 30 tablet 0   albuterol  (VENTOLIN  HFA) 108 (90 Base) MCG/ACT inhaler INHALE 2 PUFFS INTO THE LUNGS EVERY 6 HOURS AS MEEDED FOR WHEEZING OR SHORTNESS OF BREATH 18 g 1   aspirin  EC 81 MG tablet Take 81 mg by mouth daily at 3 pm.      atorvastatin  (LIPITOR) 40 MG tablet Take 1 tablet (40 mg total) by mouth daily. 90 tablet 3   Blood Glucose Monitoring Suppl (ACCU-CHEK GUIDE ME) w/Device KIT Use as directed. E11.65 1 kit 0   budesonide-glycopyrrolate -formoterol  (BREZTRI  AEROSPHERE) 160-9-4.8 MCG/ACT AERO inhaler Inhale 2 puffs into the lungs 2 (two) times daily. 11 g 0   buPROPion  (WELLBUTRIN  XL) 150 MG 24 hr tablet Take 1 tablet (150 mg total) by mouth daily. Take along with 300 mg daily 90 tablet 3   buPROPion  (WELLBUTRIN  XL) 300 MG 24 hr  tablet Take 1 tablet (300 mg total) by mouth daily. Take along with 150 mg daily  90 tablet 3   Continuous Glucose Sensor (DEXCOM G7 SENSOR) MISC Use one every 10 days for uncontrolled diabetes E11.65 3 each 11   cyclobenzaprine  (FLEXERIL ) 10 MG tablet Take 0.5-1 tablets (5-10 mg total) by mouth 3 (three) times daily as needed for muscle spasms. 60 tablet 2   dapagliflozin  propanediol (FARXIGA ) 10 MG TABS tablet Take 1 tablet (10 mg total) by mouth daily before breakfast. 90 tablet 2   DULoxetine  (CYMBALTA ) 30 MG capsule Take 1 capsule (30 mg total) by mouth daily. Take along with 60 mg daily , total of 90 mg daily 90 capsule 3   DULoxetine  (CYMBALTA ) 60 MG capsule Take 1 capsule (60 mg total) by mouth daily. Take along with 30 mg daily 90 capsule 3   ezetimibe  (ZETIA ) 10 MG tablet Take 1 tablet by mouth once daily 90 tablet 0   famotidine  (PEPCID ) 20 MG tablet Take 1 tablet by mouth twice daily 180 tablet 0   glucose blood (ACCU-CHEK GUIDE) test strip Use 1 test strip to check fasting glucose once daily and as need for diabetes 100 each 12   insulin  glargine, 2 Unit Dial , (TOUJEO  MAX SOLOSTAR) 300 UNIT/ML Solostar Pen Inject 50 Units into the skin daily. 15 mL 3   insulin  lispro (HUMALOG  KWIKPEN) 200 UNIT/ML KwikPen INJECT INSULIN  INTO THE SKIN WITH MEALS THREE TIMES DAILY PER SLIDING SCALE PROVIDED. MAX DAILY DOSE IS 42 UNITS 6 mL 3   Insulin  Pen Needle 32G X 4 MM MISC Use with daily dose insulin  and with sliding scale insulin  as needed. 100 each 5   lidocaine  (LIDODERM ) 5 % Place 1 patch onto the skin daily. Remove & Discard patch within 12 hours or as directed by MD 30 patch 0   Melatonin 10 MG CAPS Take 30 mg by mouth at bedtime.     meloxicam  (MOBIC ) 15 MG tablet Take 15 mg by mouth daily.     metoprolol  tartrate (LOPRESSOR ) 100 MG tablet Take 1 tablet by mouth twice daily 180 tablet 0   nitroGLYCERIN  (NITROSTAT ) 0.4 MG SL tablet Place 1 tablet (0.4 mg total) under the tongue every 5 (five) minutes x 3 doses as needed for chest pain. 30 tablet 1   oxyCODONE  (OXY  IR/ROXICODONE ) 5 MG immediate release tablet Take 1 tablet (5 mg total) by mouth 2 (two) times daily as needed for severe pain (pain score 7-10). 40 tablet 0   OZEMPIC , 1 MG/DOSE, 4 MG/3ML SOPN INJECT 1MG   SUBCUTANEOUSLY ONCE A WEEK 9 mL 0   pantoprazole  (PROTONIX ) 40 MG tablet Take 1 tablet by mouth once daily 90 tablet 0   pregabalin  (LYRICA ) 50 MG capsule Take 1 capsule (50 mg total) by mouth 3 (three) times daily. For 7 days then increase to 2 capsules PO 3 times daily 159 capsule 0   tamsulosin  (FLOMAX ) 0.4 MG CAPS capsule Take 1 capsule by mouth once daily 90 capsule 0   temazepam  (RESTORIL ) 30 MG capsule Take 1 capsule (30 mg total) by mouth at bedtime as needed for sleep. 30 capsule 5   varenicline  (CHANTIX ) 1 MG tablet Take 1 tablet by mouth twice daily 60 tablet 3   Zoster Vaccine Adjuvanted [facility]) injection Inject 0.5 mLs into the muscle once as needed for up to 1 dose (vaccination). 0.5 mL 1   rOPINIRole  (REQUIP ) 0.25 MG tablet Take 1 tablet (0.25 mg total) by mouth 3 (three)  times daily. 90 tablet 1   No current facility-administered medications for this visit.     Musculoskeletal: Strength & Muscle Tone: within normal limits Gait & Station: normal Patient leans: N/A  Psychiatric Specialty Exam: Review of Systems  Psychiatric/Behavioral:  Positive for sleep disturbance. The patient is nervous/anxious.     Blood pressure 118/72, pulse 94, temperature 98.4 F (36.9 C), temperature source Oral, height 5' 8 (1.727 m), weight 207 lb (93.9 kg), SpO2 95%.Body mass index is 31.47 kg/m.  General Appearance: Fairly Groomed  Eye Contact:  Fair  Speech:  Clear and Coherent  Volume:  Normal  Mood:  Anxious  Affect:  Congruent  Thought Process:  Goal Directed and Descriptions of Associations: Intact  Orientation:  Full (Time, Place, and Person)  Thought Content: Logical   Suicidal Thoughts:  No  Homicidal Thoughts:  No  Memory:  Immediate;   Fair Recent;   Fair Remote;    Fair  Judgement:  Fair  Insight:  Fair  Psychomotor Activity:  Normal  Concentration:  Concentration: Fair and Attention Span: Fair  Recall:  [employer] of Knowledge: Fair  Language: Fair  Akathisia:  No  Handed:  Right  AIMS (if indicated): not done  Assets:  Communication Skills Desire for Improvement Housing Social Support Transportation  ADL's:  Intact  Cognition: WNL  Sleep:  varies due to pain   Screenings: GAD-7    Flowsheet Row Office Visit from 10/30/2023 in [facility] Office Visit from 02/22/2023 in [facility] Office Visit from 02/09/2022 in [facility] Video Visit from 12/20/2021 in [facility] Video Visit from 06/20/2021 in [facility]  Total GAD-7 Score 5 5 7 6 8    Mini-Mental    Flowsheet Row Office Visit from 10/24/2023 in [facility] Office Visit from 10/23/2022 in [facility] Office Visit from 10/19/2021 in [facility], [facility] Clinical Support from 10/18/2020 in [facility], [facility] Clinical Support from 10/17/2019 in [facility], [facility]  Total Score (max 30 points ) 27 30 30 29 30    PHQ2-9    Flowsheet Row Office Visit from 10/30/2023 in [facility] Office Visit from 02/22/2023 in [facility] Office Visit from 12/14/2022 in [facility] [place] Office Visit from 10/23/2022 in [facility] Office Visit from 02/09/2022 in [facility]  PHQ-2 Total Score 1 2 1 1 2   PHQ-9 Total Score -- 7 -- -- 8   Flowsheet Row Office Visit from 10/30/2023 in [facility] ED from 09/15/2023 in [facility] at [facility] Video Visit from 09/14/2023 in [facility]  C-SSRS RISK CATEGORY No Risk No Risk Moderate Risk     Assessment and Plan: Raymond [name] is a 58 year old Caucasian male, lives in [place], has a history of MDD, insomnia, adenocarcinoma of right lung status post lung segmentectomy was evaluated in office today.  Discussed assessment and plan as noted below.  Major depression in remission Other specified anxiety disorder-generalized anxiety disorder occurring more days than not-stable Posttraumatic stress disorder-stable Currently reports mood symptoms are stable although does have anxiety due to his back pain and physical limitations.  Scheduled to see a pain provider  soon. Continue Cymbalta  90 mg daily Continue Wellbutrin  XL 450 mg daily Continue hydroxyzine  25-50 mg at bedtime as needed  Insomnia-unstable Sleep problems are currently due to back pain.  Requip  does help with restless leg symptoms.  Currently compliant on CPAP.  CPAP also describes her sleep due to mask coming out which can cause discomfort. 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 Patient to explore alternative options for treatment of obstructive sleep apnea due to concerns about CPAP mask problems.  Tobacco use disorder-improving Continues to smoke half a pack although currently on Chantix . Continue Chantix  1 mg twice daily Encouraged to use nicotine  replacement, patches or gums in combination with Chantix .  Follow-up Follow-up in clinic in 2 months or sooner if needed.    Collaboration of Care: Collaboration of Care: {[facility] OP Collaboration of Care:[id number]}  Patient/Guardian was advised Release of Information must be obtained prior to any record release in order to collaborate their  care with an outside provider. Patient/Guardian was advised if they have not already done so to contact the registration department to sign all necessary forms in order for us  to release information regarding their care.   Consent: Patient/Guardian gives verbal consent for treatment and assignment of benefits for services provided during this visit. Patient/Guardian expressed understanding and agreed to proceed.    Morley Gaumer, MD 10/30/2023, 1:58 PM

## 2023-11-04 NOTE — Procedures (Signed)
 [facility] [address] [place], [address]    Complete Pulmonary Function Testing Interpretation:  FINDINGS:  Forced vital capacity is normal.  FEV1 is normal.  FEV1 FVC ratio is mildly decreased.  Postbronchodilator there is no significant change in FEV1 clinical improvement may occur in the absence of spirometric improvement.  Total lung capacity is normal.  Residual volume is normal.  FRC is increased.  DLCO is normal.  IMPRESSION:  This pulmonary function study is within normal limits clinical correlation is recommended  Elfreda DELENA Bathe, MD [facility] Pulmonary Critical Care Medicine Sleep Medicine

## 2023-11-05 ENCOUNTER — Ambulatory Visit
Admission: RE | Admit: 2023-11-05 | Discharge: 2023-11-05 | Disposition: A | Discharge status: Home / Self Care | Payer: 59 | Source: Ambulatory Visit | Attending: Internal Medicine | Admitting: Internal Medicine

## 2023-11-05 DIAGNOSIS — C3491 Malignant neoplasm of unspecified part of right bronchus or lung: Secondary | ICD-10-CM | POA: Diagnosis not present

## 2023-11-05 DIAGNOSIS — I700 Atherosclerosis of aorta: Secondary | ICD-10-CM | POA: Diagnosis not present

## 2023-11-05 DIAGNOSIS — C3431 Malignant neoplasm of lower lobe, right bronchus or lung: Secondary | ICD-10-CM | POA: Diagnosis not present

## 2023-11-05 LAB — POCT I-STAT CREATININE: Creatinine, Ser: 0.9 mg/dL (ref 0.61–1.24)

## 2023-11-05 MED ORDER — IOHEXOL 300 MG/ML  SOLN
75.0000 mL | Freq: Once | INTRAMUSCULAR | Status: AC | PRN
  Administered 2023-11-05: 75 mL via INTRAVENOUS

## 2023-11-06 DIAGNOSIS — Z79899 Other long term (current) drug therapy: Secondary | ICD-10-CM | POA: Insufficient documentation

## 2023-11-06 DIAGNOSIS — M899 Disorder of bone, unspecified: Secondary | ICD-10-CM | POA: Insufficient documentation

## 2023-11-06 DIAGNOSIS — Z789 Other specified health status: Secondary | ICD-10-CM | POA: Insufficient documentation

## 2023-11-06 DIAGNOSIS — G894 Chronic pain syndrome: Secondary | ICD-10-CM | POA: Insufficient documentation

## 2023-11-06 NOTE — Progress Notes (Unsigned)
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 sister, and sister; Diabetes in his brother, maternal aunt, mother, sister, sister, sister, and sister; Diabetes type II in his mother; Heart disease in his father; Hypertension in his mother; Lung cancer in his father.  Past Surgical History:  Procedure Laterality Date    ingrown toenail removal     CHOLECYSTECTOMY N/A 01/23/2019   Procedure: LAPAROSCOPIC CHOLECYSTECTOMY;  Surgeon: Desiderio Schanz, MD;  Location: [facility];  Service: General;  Laterality: N/A;   CLAVICLE SURGERY Right    COLONOSCOPY  2017   COLONOSCOPY WITH PROPOFOL  N/A 08/25/2020   Procedure: COLONOSCOPY WITH PROPOFOL ;  Surgeon: Janalyn [name] NOVAK, MD;  Location: [facility];  Service: Endoscopy;  Laterality: N/A;   ESOPHAGOGASTRODUODENOSCOPY (EGD) WITH PROPOFOL  N/A  08/25/2020   Procedure: ESOPHAGOGASTRODUODENOSCOPY (EGD) WITH PROPOFOL ;  Surgeon: Janalyn [name] NOVAK, MD;  Location: [facility];  Service: Endoscopy;  Laterality: N/A;   IR THORACENTESIS ASP PLEURAL SPACE W/IMG GUIDE  11/20/2022   LUNG LOBECTOMY Right 2024   LYMPH NODE DISSECTION Right 10/27/2022   Procedure: LYMPH NODE DISSECTION;  Surgeon: Kerrin Elspeth BROCKS, MD;  Location: [facility];  Service: Thoracic;  Laterality: Right;   MOUTH SURGERY     REMOVED ALL TEETH   SHOULDER SURGERY Right    UPPER GI ENDOSCOPY  2017   XI ROBOTIC ASSISTED THORACOSCOPY- SEGMENTECTOMY Right 10/27/2022   Procedure: XI ROBOTIC ASSISTED THORACOSCOPY-RIGHT LOWER LOBE SUPERIOR SEGMENTECTOMY;  Surgeon: Kerrin,  Elspeth BROCKS, MD;  Location: [facility] OR;  Service: Thoracic;  Laterality: Right;   Active Ambulatory Problems    Diagnosis Date Noted   Hypertension associated with diabetes (HCC) 04/08/2015   Depression 04/08/2015   Gastroesophageal reflux disease without esophagitis 01/05/2015   Influenza A 05/30/2017   Cervical spondylosis 06/14/2015   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   OSA (obstructive sleep apnea) 08/12/2015   Uncontrolled type 2 diabetes mellitus with hypoglycemia without coma (HCC) 07/08/2017   Shortness of breath 07/08/2017   Cigarette nicotine  dependence without complication 07/08/2017   Encounter for general adult medical examination with abnormal findings 10/27/2017   Uncontrolled type 2 diabetes mellitus with hyperglycemia (HCC) 10/27/2017   Onychomycosis with ingrown toenail 10/27/2017   Dysuria 10/27/2017   Clotted blood in stool 12/31/2017   Diarrhea 12/31/2017   Anxiety disorder 04/01/2018   Diastasis recti 06/19/2018   Enterocolitis 08/16/2018   Left upper quadrant abdominal pain of unknown etiology 08/16/2018   Generalized abdominal pain 10/08/2018   Local superficial swelling, mass or lump 10/08/2018   Calculus of gallbladder with acute on chronic  cholecystitis without obstruction 11/15/2018   Encounter for other preprocedural examination 01/20/2019   PTSD (post-traumatic stress disorder) 01/20/2019   MDD (major depressive disorder), recurrent episode, moderate (HCC) 01/20/2019   Insomnia due to mental disorder 01/20/2019   Right upper quadrant pain    Tobacco use disorder 07/02/2019   MDD (major depressive disorder), recurrent, in partial remission (HCC) 10/30/2019   MDD (major depressive disorder), recurrent, in full remission (HCC) 01/05/2020   Noncompliance with diabetes treatment 04/12/2020   Angina pectoris (HCC) 04/12/2020   MDD (major depressive disorder), recurrent episode, mild (HCC) 06/23/2020   Barrett's esophagus without dysplasia    Gastric erythema    Special screening for malignant neoplasms, colon    Polyp of colon    Mixed hyperlipidemia 09/26/2020   Pulmonary emphysema (HCC) 09/26/2020   Insomnia due to medical condition 12/13/2020   At risk for prolonged QT interval syndrome 12/13/2020   Hypersomnia with sleep apnea 12/13/2020   Abnormal LFTs 04/22/2021   Aortic atherosclerosis (HCC) 12/07/2021   Lung nodule 10/27/2022   Right lower lobe pulmonary nodule 10/27/2022   S/P robot-assisted Video Assisted Thoracoscopy with Right Lower Lobe Superior Segmentectomy, Node Dissection, and Intercostal Nerve Block 10/28/2022   Pleural effusion on right 11/20/2022   Primary adenocarcinoma of right lung (HCC) 11/21/2022   Type 2 diabetes mellitus with hyperglycemia, with long-term current use of insulin  (HCC) 11/21/2022   Diabetic polyneuropathy associated with type 2 diabetes mellitus (HCC) 09/05/2023   Diabetes mellitus (HCC) 09/05/2023   Chronic diastolic heart failure (HCC) 09/05/2023   Obesity, morbid (HCC) 09/05/2023   Lumbar radiculopathy, chronic 09/25/2023   Spinal stenosis of lumbar region with neurogenic claudication 09/25/2023   Chronic pain syndrome 11/06/2023   Pharmacologic therapy 11/06/2023    Disorder of skeletal system 11/06/2023   Problems influencing health status 11/06/2023   Resolved Ambulatory Problems    Diagnosis Date Noted   No Resolved Ambulatory Problems   Past Medical History:  Diagnosis Date   Anxiety    Arthritis    Bronchitis    Cancer (HCC)    Coronary artery disease    Diabetes mellitus without complication (HCC)    Diastasis of rectus abdominis 06/19/2018   Dyspnea    Dysrhythmia    Emphysema lung (HCC)    Family history of adverse reaction to anesthesia    Fatty liver  GERD (gastroesophageal reflux disease)    Headache    Hyperlipidemia    Hypertension    Irregular heart beat unk   Myocardial infarction (HCC) 2010   Sleep apnea    Tachycardia    Ventral hernia    Constitutional Exam  General appearance: Well nourished, well developed, and well hydrated. In no apparent acute distress There were no vitals filed for this visit. BMI Assessment: Estimated body mass index is 31.47 kg/m as calculated from the following:   Height as of 10/30/23: 5' 8 (1.727 m).   Weight as of 10/30/23: 207 lb (93.9 kg).  BMI interpretation table: BMI level Category Range association with higher incidence of chronic pain  <18 kg/m2 Underweight   18.5-24.9 kg/m2 Ideal body weight   25-29.9 kg/m2 Overweight Increased incidence by 20%  30-34.9 kg/m2 Obese (Class I) Increased incidence by 68%  35-39.9 kg/m2 Severe obesity (Class II) Increased incidence by 136%  >40 kg/m2 Extreme obesity (Class III) Increased incidence by 254%   Patient's current BMI Ideal Body weight  There is no height or weight on file to calculate BMI. Ideal body weight: 68.4 kg (150 lb 12.7 oz) Adjusted ideal body weight: 78.6 kg (173 lb 4.4 oz)   BMI Readings from Last 4 Encounters:  10/29/23 30.45 kg/m  10/24/23 30.24 kg/m  09/24/23 30.89 kg/m  09/15/23 33.52 kg/m   Wt Readings from Last 4 Encounters:  10/29/23 206 lb 3.2 oz (93.5 kg)  10/24/23 204 lb 12.8 oz (92.9 kg)  09/24/23  209 lb 3.2 oz (94.9 kg)  09/15/23 227 lb (103 kg)    Psych/Mental status: Alert, oriented x 3 (person, place, & time)       Eyes: PERLA Respiratory: No evidence of acute respiratory distress  Assessment  Primary Diagnosis & Pertinent Problem List: The primary encounter diagnosis was Chronic pain syndrome. Diagnoses of Pharmacologic therapy, Disorder of skeletal system, and Problems influencing health status were also pertinent to this visit.  Visit Diagnosis (New problems to examiner): 1. Chronic pain syndrome   2. Pharmacologic therapy   3. Disorder of skeletal system   4. Problems influencing health status    Plan of Care (Initial workup plan)  Note: Raymond [name] was reminded that as per protocol, today's visit has been an evaluation only. We have not taken over the patient's controlled substance management.  Problem-specific plan: Assessment and Plan            Lab Orders  No laboratory test(s) ordered today   Imaging Orders  No imaging studies ordered today   Referral Orders  No referral(s) requested today   Procedure Orders    No procedure(s) ordered today   Pharmacotherapy (current): Medications ordered:  No orders of the defined types were placed in this encounter.  Medications administered during this visit: Evan E. Perine had no medications administered during this visit.   Analgesic Pharmacotherapy:  Opioid Analgesics: For patients currently taking or requesting to take opioid analgesics, in accordance with [place]  Medical Board Guidelines, we will assess their risks and indications for the use of these substances. After completing our evaluation, we may offer recommendations, but we no longer take patients for medication management. The prescribing physician will ultimately decide, based on his/her training and level of comfort whether to adopt any of the recommendations, including whether or not to prescribe such medicines.  Membrane stabilizer:  To be determined at a later time  Muscle relaxant: To be determined at a later time  NSAID: To  be determined at a later time  Other analgesic(s): To be determined at a later time   Interventional management options: Raymond [name] was informed that there is no guarantee that he would be a candidate for interventional therapies. The decision will be based on the results of diagnostic studies, as well as Raymond [name] risk profile.  Procedure(s) under consideration:  Pending results of ordered studies     Interventional Therapies  Risk Factors  Considerations  Medical Comorbidities:     Planned  Pending:      Under consideration:   Pending   Completed: (Analgesic benefit)1  None at this time   Therapeutic  Palliative (PRN) options:   None established   Completed by other providers:   None reported  1(Analgesic benefit): Expressed in percentage (%). (Local anesthetic[LA] +/- sedation  L.A.Local Anesthetic  Steroid benefit  Ongoing benefit)   Provider-requested follow-up: No follow-ups on file.  Future Appointments  Date Time Provider Department Center  11/07/2023 10:00 AM Tanya Glisson, MD [facility] None  11/15/2023  1:15 PM [facility] None  11/15/2023  1:30 PM Dasie Tinnie MATSU, NP [facility] None  11/16/2023  7:30 AM [facility]  12/10/2023 10:30 AM Fernand Elfreda LABOR, MD [facility] None  12/31/2023  1:40 PM Eappen, Saramma, MD [facility] None  02/27/2024  9:40 AM Liana Fish, NP [facility] None  10/15/2024  2:30 PM Fernand Elfreda LABOR, MD [facility] None  10/27/2024 10:40 AM Liana Fish, NP [facility] None  10/28/2024  2:15 PM Fernand Elfreda LABOR, MD [facility] None   I discussed the assessment and treatment plan with the patient. The patient was provided an opportunity to ask questions and all were answered. The patient agreed with the plan and demonstrated an understanding of the instructions.  Patient advised to call back or seek an in-person evaluation if the  symptoms or condition worsens.  Duration of encounter: *** minutes.  Total time on encounter, as per AMA guidelines included both the face-to-face and non-face-to-face time personally spent by the physician and/or other qualified health care professional(s) on the day of the encounter (includes time in activities that require the physician or other qualified health care professional and does not include time in activities normally performed by clinical staff). Physician's time may include the following activities when performed: Preparing to see the patient (e.g., pre-charting review of records, searching for previously ordered imaging, lab work, and nerve conduction tests) Review of prior analgesic pharmacotherapies. Reviewing PMP Interpreting ordered tests (e.g., lab work, imaging, nerve conduction tests) Performing post-procedure evaluations, including interpretation of diagnostic procedures Obtaining and/or reviewing separately obtained history Performing a medically appropriate examination and/or evaluation Counseling and educating the patient/family/caregiver Ordering medications, tests, or procedures Referring and communicating with other health care professionals (when not separately reported) Documenting clinical information in the electronic or other health record Independently interpreting results (not separately reported) and communicating results to the patient/ family/caregiver Care coordination (not separately reported)  Note by: Glisson LABOR Tanya, MD (TTS and AI technology used. I apologize for any typographical errors that were not detected and corrected.) Date: 11/07/2023; Time: 5:20 PM

## 2023-11-06 NOTE — Patient Instructions (Signed)

## 2023-11-07 ENCOUNTER — Encounter: Payer: Self-pay | Admitting: Pain Medicine

## 2023-11-07 ENCOUNTER — Ambulatory Visit
Admission: RE | Admit: 2023-11-07 | Discharge: 2023-11-07 | Disposition: A | Discharge status: Home / Self Care | Source: Ambulatory Visit | Attending: Pain Medicine | Admitting: Pain Medicine

## 2023-11-07 ENCOUNTER — Ambulatory Visit: Admitting: Pain Medicine

## 2023-11-07 VITALS — BP 142/87 | HR 86 | Temp 97.9°F | Ht 68.0 in | Wt 207.0 lb

## 2023-11-07 DIAGNOSIS — M545 Low back pain, unspecified: Secondary | ICD-10-CM | POA: Insufficient documentation

## 2023-11-07 DIAGNOSIS — Z85118 Personal history of other malignant neoplasm of bronchus and lung: Secondary | ICD-10-CM

## 2023-11-07 DIAGNOSIS — G4486 Cervicogenic headache: Secondary | ICD-10-CM | POA: Diagnosis not present

## 2023-11-07 DIAGNOSIS — G894 Chronic pain syndrome: Secondary | ICD-10-CM | POA: Diagnosis not present

## 2023-11-07 DIAGNOSIS — M533 Sacrococcygeal disorders, not elsewhere classified: Secondary | ICD-10-CM | POA: Insufficient documentation

## 2023-11-07 DIAGNOSIS — M5459 Other low back pain: Secondary | ICD-10-CM

## 2023-11-07 DIAGNOSIS — M25551 Pain in right hip: Secondary | ICD-10-CM

## 2023-11-07 DIAGNOSIS — G8929 Other chronic pain: Secondary | ICD-10-CM | POA: Insufficient documentation

## 2023-11-07 DIAGNOSIS — Z79899 Other long term (current) drug therapy: Secondary | ICD-10-CM

## 2023-11-07 DIAGNOSIS — M9904 Segmental and somatic dysfunction of sacral region: Secondary | ICD-10-CM | POA: Insufficient documentation

## 2023-11-07 DIAGNOSIS — M542 Cervicalgia: Secondary | ICD-10-CM

## 2023-11-07 DIAGNOSIS — M4802 Spinal stenosis, cervical region: Secondary | ICD-10-CM | POA: Diagnosis not present

## 2023-11-07 DIAGNOSIS — M47816 Spondylosis without myelopathy or radiculopathy, lumbar region: Secondary | ICD-10-CM | POA: Diagnosis not present

## 2023-11-07 DIAGNOSIS — M5021 Other cervical disc displacement,  high cervical region: Secondary | ICD-10-CM | POA: Diagnosis not present

## 2023-11-07 DIAGNOSIS — F17200 Nicotine dependence, unspecified, uncomplicated: Secondary | ICD-10-CM

## 2023-11-07 DIAGNOSIS — M79604 Pain in right leg: Secondary | ICD-10-CM | POA: Insufficient documentation

## 2023-11-07 DIAGNOSIS — M899 Disorder of bone, unspecified: Secondary | ICD-10-CM | POA: Diagnosis not present

## 2023-11-07 DIAGNOSIS — M4312 Spondylolisthesis, cervical region: Secondary | ICD-10-CM | POA: Diagnosis not present

## 2023-11-07 DIAGNOSIS — Z789 Other specified health status: Secondary | ICD-10-CM | POA: Diagnosis not present

## 2023-11-07 NOTE — Progress Notes (Signed)
 Safety precautions to be maintained throughout the outpatient stay will include: orient to surroundings, keep bed in low position, maintain call bell within reach at all times, provide assistance with transfer out of bed and ambulation.

## 2023-11-12 LAB — COMPLIANCE DRUG ANALYSIS, UR

## 2023-11-13 LAB — COMP. METABOLIC PANEL (12)
AST: 16 IU/L (ref 0–40)
Albumin: 4.5 g/dL (ref 3.8–4.9)
Alkaline Phosphatase: 146 IU/L — ABNORMAL HIGH (ref 44–121)
BUN/Creatinine Ratio: 14 (ref 9–20)
BUN: 11 mg/dL (ref 6–24)
Bilirubin Total: 0.2 mg/dL (ref 0.0–1.2)
Calcium: 10.5 mg/dL — ABNORMAL HIGH (ref 8.7–10.2)
Chloride: 103 mmol/L (ref 96–106)
Creatinine, Ser: 0.81 mg/dL (ref 0.76–1.27)
Globulin, Total: 3 g/dL (ref 1.5–4.5)
Glucose: 162 mg/dL — ABNORMAL HIGH (ref 70–99)
Potassium: 4.8 mmol/L (ref 3.5–5.2)
Sodium: 142 mmol/L (ref 134–144)
Total Protein: 7.5 g/dL (ref 6.0–8.5)
eGFR: 102 mL/min/1.73 (ref >59)

## 2023-11-13 LAB — MAGNESIUM: Magnesium: 2.1 mg/dL (ref 1.6–2.3)

## 2023-11-13 LAB — 25-HYDROXY VITAMIN D LCMS D2+D3
25-Hydroxy, Vitamin D-2: <1 ng/mL
25-Hydroxy, Vitamin D-3: 28 ng/mL
25-Hydroxy, Vitamin D: 28 ng/mL — ABNORMAL LOW

## 2023-11-13 LAB — PULMONARY FUNCTION TEST

## 2023-11-13 LAB — VITAMIN B12: Vitamin B-12: 150 pg/mL — ABNORMAL LOW (ref 232–1245)

## 2023-11-13 LAB — C-REACTIVE PROTEIN: CRP: 3 mg/L (ref 0–10)

## 2023-11-13 LAB — SEDIMENTATION RATE: Sed Rate: 12 mm/h (ref 0–30)

## 2023-11-14 ENCOUNTER — Other Ambulatory Visit: Payer: Self-pay

## 2023-11-14 DIAGNOSIS — C3491 Malignant neoplasm of unspecified part of right bronchus or lung: Secondary | ICD-10-CM

## 2023-11-15 ENCOUNTER — Ambulatory Visit: Payer: 59 | Admitting: Internal Medicine

## 2023-11-15 ENCOUNTER — Encounter: Payer: Self-pay | Admitting: Nurse Practitioner

## 2023-11-15 ENCOUNTER — Inpatient Hospital Stay: Admitting: Nurse Practitioner

## 2023-11-15 ENCOUNTER — Inpatient Hospital Stay: Payer: 59 | Attending: Nurse Practitioner

## 2023-11-15 ENCOUNTER — Encounter: Payer: Self-pay | Admitting: Oncology

## 2023-11-15 VITALS — BP 117/79 | HR 97 | Temp 98.0°F | Resp 16 | Wt 205.0 lb

## 2023-11-15 DIAGNOSIS — Z85118 Personal history of other malignant neoplasm of bronchus and lung: Secondary | ICD-10-CM | POA: Diagnosis not present

## 2023-11-15 DIAGNOSIS — E785 Hyperlipidemia, unspecified: Secondary | ICD-10-CM | POA: Insufficient documentation

## 2023-11-15 DIAGNOSIS — Z87891 Personal history of nicotine dependence: Secondary | ICD-10-CM

## 2023-11-15 DIAGNOSIS — I10 Essential (primary) hypertension: Secondary | ICD-10-CM | POA: Diagnosis not present

## 2023-11-15 DIAGNOSIS — R0789 Other chest pain: Secondary | ICD-10-CM | POA: Diagnosis not present

## 2023-11-15 DIAGNOSIS — E119 Type 2 diabetes mellitus without complications: Secondary | ICD-10-CM | POA: Insufficient documentation

## 2023-11-15 DIAGNOSIS — F419 Anxiety disorder, unspecified: Secondary | ICD-10-CM | POA: Insufficient documentation

## 2023-11-15 DIAGNOSIS — C3491 Malignant neoplasm of unspecified part of right bronchus or lung: Secondary | ICD-10-CM

## 2023-11-15 DIAGNOSIS — F17210 Nicotine dependence, cigarettes, uncomplicated: Secondary | ICD-10-CM | POA: Diagnosis not present

## 2023-11-15 DIAGNOSIS — Z08 Encounter for follow-up examination after completed treatment for malignant neoplasm: Secondary | ICD-10-CM | POA: Diagnosis not present

## 2023-11-15 DIAGNOSIS — F32A Depression, unspecified: Secondary | ICD-10-CM | POA: Insufficient documentation

## 2023-11-15 LAB — CBC WITH DIFFERENTIAL (CANCER CENTER ONLY)
Abs Immature Granulocytes: 0.05 K/uL (ref 0.00–0.07)
Basophils Absolute: 0.1 K/uL (ref 0.0–0.1)
Basophils Relative: 1 %
Eosinophils Absolute: 0.2 K/uL (ref 0.0–0.5)
Eosinophils Relative: 2 %
HCT: 41.6 % (ref 39.0–52.0)
Hemoglobin: 14.3 g/dL (ref 13.0–17.0)
Immature Granulocytes: 1 %
Lymphocytes Relative: 32 %
Lymphs Abs: 2.9 K/uL (ref 0.7–4.0)
MCH: 30.8 pg (ref 26.0–34.0)
MCHC: 34.4 g/dL (ref 30.0–36.0)
MCV: 89.7 fL (ref 80.0–100.0)
Monocytes Absolute: 0.7 K/uL (ref 0.1–1.0)
Monocytes Relative: 8 %
Neutro Abs: 5.1 K/uL (ref 1.7–7.7)
Neutrophils Relative %: 56 %
Platelet Count: 208 K/uL (ref 150–400)
RBC: 4.64 MIL/uL (ref 4.22–5.81)
RDW: 13.2 % (ref 11.5–15.5)
WBC Count: 8.9 K/uL (ref 4.0–10.5)
nRBC: 0 % (ref 0.0–0.2)

## 2023-11-15 LAB — CMP (CANCER CENTER ONLY)
ALT: 19 U/L (ref 0–44)
AST: 17 U/L (ref 15–41)
Albumin: 4.2 g/dL (ref 3.5–5.0)
Alkaline Phosphatase: 121 U/L (ref 38–126)
Anion gap: 9 (ref 5–15)
BUN: 19 mg/dL (ref 6–20)
CO2: 24 mmol/L (ref 22–32)
Calcium: 9.6 mg/dL (ref 8.9–10.3)
Chloride: 103 mmol/L (ref 98–111)
Creatinine: 1.04 mg/dL (ref 0.61–1.24)
GFR, Estimated: >60 mL/min (ref >60)
Glucose, Bld: 257 mg/dL — ABNORMAL HIGH (ref 70–99)
Potassium: 4.6 mmol/L (ref 3.5–5.1)
Sodium: 136 mmol/L (ref 135–145)
Total Bilirubin: 0.5 mg/dL (ref 0.0–1.2)
Total Protein: 7.7 g/dL (ref 6.5–8.1)

## 2023-11-15 NOTE — Congregational Nurse Program (Signed)
  Dept: [phone]   Congregational Nurse Program Note  Date of Encounter: 11/15/2023  Past Medical History: Past Medical History:  Diagnosis Date   Anxiety    Arthritis    NECK   Bronchitis    Cancer (HCC)    R lung, had lobectomy   Coronary artery disease    Depression    Diabetes mellitus without complication (HCC)    Diastasis of rectus abdominis 06/19/2018   Dyspnea    DUE TO GALLBLADDER PER PT   Dysrhythmia    fast hear rate at times. stopped when placed on metoprolol    Emphysema lung (HCC)    Family history of adverse reaction to anesthesia    N/V, TROUBLE BREATHING COMING OUT OF ANESTHESIA   Fatty liver    GERD (gastroesophageal reflux disease)    Headache    MIGRAINES   Hyperlipidemia    Hypertension    Irregular heart beat unk   Myocardial infarction (HCC) 2010   MILD    PTSD (post-traumatic stress disorder)    Sleep apnea    USES CPAP   Tachycardia    Ventral hernia     Encounter Details:  Community Questionnaire - 11/15/23 1146       Questionnaire   Ask client: Do you give verbal consent for me to treat you today? Yes    Student Assistance N/A    Location [facility]    Population Status Unknown    Insurance Medicaid    Insurance/Financial Assistance Referral N/A    Medication N/A    Medical Provider Yes    Screening Referrals Made N/A    Medical Referrals Made N/A    Medical Appointment Completed N/A    CNP Interventions Advocate/Support    Screenings CN Performed Blood Pressure    ED Visit Averted N/A    Life-Saving Intervention Made N/A          Today's Vitals   11/15/23 1145  BP: 130/72   There is no height or weight on file to calculate BMI.

## 2023-11-15 NOTE — Progress Notes (Signed)
 [facility] CONSULT NOTE  Patient Care Team: Liana Fish, Raymond [name] as PCP - General (Nurse Practitioner) Fernand Sigrid HERO, MD (Internal Medicine) Verdene Gills, RN as Oncology Nurse Navigator  REFERRING PROVIDER: Dr. Kerrin  REASON FOR REFFERAL: Stage Ia lung cancer, surveillance  CANCER STAGING   Cancer Staging  Primary adenocarcinoma of right lung [facility]) Staging form: Lung, AJCC 8th Edition - Clinical stage from 10/27/2022: Stage IA2 (cT1b, cN0, cM0) - Signed by Agrawal, Kavita, MD on 12/15/2022  HISTORY OF PRESENTING ILLNESS:  Raymond [name] 58 y.o. male with pmh of smoking,Diabetes, depression, anxiety, OSA, hypertension, hyperlipidemia referred to medical oncology for surveillance of stage Ia right lung adenocarcinoma detected on low-dose CT lung screening. Oncology History  Primary adenocarcinoma of right lung (HCC)  08/31/2022 Imaging   With history of smoking, patient underwent low-dose CT lung screening.  MPRESSION: 1. Lung-RADS 4B, suspicious. Additional imaging evaluation or consultation with Pulmonology or Thoracic Surgery recommended. New indistinct irregular solid superior segment right lower lobe pulmonary nodule measuring 12.5 mm in volume derived mean diameter, suspicious for primary bronchogenic malignancy. PET-CT suggested at this time for further characterization. 2. Two-vessel coronary atherosclerosis. 3. Aortic Atherosclerosis   09/20/2022 PET scan   MPRESSION: 15 mm bilobed nodule in the superior segment right lower lobe demonstrates mild hypermetabolism. While infectious/inflammatory etiologies are technically possible, this appearance is considered suspicious for primary bronchogenic carcinoma such as semi-invasive adenocarcinoma.   No evidence of metastatic disease.   10/27/2022 Cancer Staging   Staging form: Lung, AJCC 8th Edition - Clinical stage from 10/27/2022: Stage IA2 (cT1b, cN0, cM0) - Signed by Agrawal, Kavita, MD on  12/15/2022   10/27/2022 Definitive Surgery   FINAL MICROSCOPIC DIAGNOSIS:  A. LYMPH NODE, LEVEL 11, EXCISION: One lymph node with rare atypical cells (see comment)  B. LYMPH NODE, LEVEL 12, EXCISION: One benign lymph node, negative for carcinoma (0/1)  C. RIGHT LUNG, LOWER LOBE, SUPERIOR SEGMENT, NODULE, SEGMENTECTOMY: Invasive well to moderately differentiated adenocarcinoma Tumor measures 1.2 x 1.0 x 1.0 cm (pT1b) Margins free  D. LYMPH NODE, LEVEL 9, EXCISION: One benign lymph node, negative for carcinoma (0/1)  E. LYMPH NODE, LEVEL 8, EXCISION: One benign lymph node, negative for carcinoma (0/1)  F. LYMPH NODE, LEVEL 8 #2, EXCISION: One benign lymph node, negative for carcinoma (0/1)  G. LYMPH NODE, LEVEL 7, EXCISION: One benign lymph node, negative for carcinoma (0/1)  H. LYMPH NODE, LEVEL 7 #2, EXCISION: One benign lymph node, negative for carcinoma (0/1)  I. LYMPH NODE, LEVEL 7 #3, EXCISION: One benign lymph node, negative for carcinoma (0/1)  J. LYMPH NODE, LEVEL 11 #2, EXCISION: One benign lymph node, negative for carcinoma (0/1)  K. LYMPH NODE, LEVEL 12 #2, EXCISION: One benign lymph node, negative for carcinoma (0/1)  L. LYMPH NODE, LEVEL 12 #3, EXCISION: One benign lymph node, negative for carcinoma (0/1)  M. LYMPH NODE, LEVEL 12 #4, EXCISION: One benign lymph node, negative for carcinoma (0/1)  N. LYMPH NODE, LEVEL 13, EXCISION: Benign alveolar laded lung with scant lymphoid stroma Negative for carcinoma  O. LYMPH NODE, LEVEL 10, EXCISION: One benign lymph node, negative for carcinoma (0/1)  P. LYMPH NODE, LEVEL 12 #5, EXCISION: One benign lymph node, negative for carcinoma (0/1)  Q. LYMPH NODE, LEVEL 13 #2, EXCISION: One benign lymph node, negative for carcinoma (0/1)  ONCOLOGY TABLE:  LUNG: Resection  Synchronous Tumors: Not applicable Total Number of Primary Tumors: 1 Procedure: Segmentectomy Specimen Laterality: Right Tumor Focality:  Unifocal Tumor  Site: Lower lobe, superior segment Tumor Size:      Total Tumor Size: 1.2 x 1.0 x 1.0 cm      Invasive Tumor Size (applies only to invasive nonmucinous adenocarcinoma with a lepidic           component): 1.1 x 0.9 x 0.9 cm Histologic Type: Adenocarcinoma Visceral Pleura Invasion: Not identified Direct Invasion of Adjacent Structures: No adjacent structures present Lymphovascular Invasion: Not identified Margins: All margins negative for invasive carcinoma      Closest Margin(s) to Invasive Carcinoma: 3 cm from bronchial margin      Margin(s) Involved by Invasive Carcinoma: Not applicable       Margin Status for Non-Invasive Tumor: Not applicable Treatment Effect: No known presurgical therapy Regional Lymph Nodes:      Number of Lymph Nodes Involved: Pending                           Nodal Sites with Tumor: Pending      Number of Lymph Nodes Examined: 15                      Nodal Sites Examined: Station 7, 8, 9, 10, 11, 12 and 13 Distant Metastasis: Not applicable Pathologic Stage Classification (pTNM, AJCC 8th Edition): pT1b, pN0 Ancillary Studies: Available upon request Representative Tumor Block: C2, C4, C5 Comment(s): On frozen section the initial station 11 lymph node shows rare atypical cells.  An immunohistochemical stain for pancytokeratin (AE1/AE3) performed with adequate control on the permanent sections of this station 11 lymph node (A) is negative for epithelial cells.       Interval history: Raymond [name] is a 58 y.o. male who returns to clinic for lung cancer surveillance. He has chronic post surgical changes but feels well and denies complaints. Continues to try to quit smoking. Is using chantix  and wellbutrin  but hasn't started nicotine  replacement. Use goes up and down.     MEDICAL HISTORY:  Past Medical History:  Diagnosis Date   Anxiety    Arthritis    NECK   Bronchitis    Cancer (HCC)    R lung, had lobectomy   Coronary artery  disease    Depression    Diabetes mellitus without complication (HCC)    Diastasis of rectus abdominis 06/19/2018   Dyspnea    DUE TO GALLBLADDER PER PT   Dysrhythmia    fast hear rate at times. stopped when placed on metoprolol    Emphysema lung (HCC)    Family history of adverse reaction to anesthesia    N/V, TROUBLE BREATHING COMING OUT OF ANESTHESIA   Fatty liver    GERD (gastroesophageal reflux disease)    Headache    MIGRAINES   Hyperlipidemia    Hypertension    Irregular heart beat unk   Myocardial infarction (HCC) 2010   MILD    PTSD (post-traumatic stress disorder)    Sleep apnea    USES CPAP   Tachycardia    Ventral hernia     SURGICAL HISTORY: Past Surgical History:  Procedure Laterality Date    ingrown toenail removal     CHOLECYSTECTOMY N/A 01/23/2019   Procedure: LAPAROSCOPIC CHOLECYSTECTOMY;  Surgeon: Desiderio Schanz, MD;  Location: [facility] ORS;  Service: General;  Laterality: N/A;   CLAVICLE SURGERY Right    COLONOSCOPY  2017   COLONOSCOPY WITH PROPOFOL  N/A 08/25/2020   Procedure: COLONOSCOPY WITH PROPOFOL ;  Surgeon: Janalyn [name] NOVAK, MD;  Location: [facility] END[facility]COPY;  Service: Endoscopy;  Laterality: N/A;   ESOPHAGOGASTRODUODENOSCOPY (EGD) WITH PROPOFOL  N/A 08/25/2020   Procedure: ESOPHAGOGASTRODUODENOSCOPY (EGD) WITH PROPOFOL ;  Surgeon: Janalyn [name] NOVAK, MD;  Location: [facility] ENDOSCOPY;  Service: Endoscopy;  Laterality: N/A;   IR THORACENTESIS ASP PLEURAL SPACE W/IMG GUIDE  11/20/2022   LUNG LOBECTOMY Right 2024   LYMPH NODE DISSECTION Right 10/27/2022   Procedure: LYMPH NODE DISSECTION;  Surgeon: Kerrin Elspeth BROCKS, MD;  Location: [facility] OR;  Service: Thoracic;  Laterality: Right;   MOUTH SURGERY     REMOVED ALL TEETH   SHOULDER SURGERY Right    UPPER GI ENDOSCOPY  2017   XI ROBOTIC ASSISTED THORACOSCOPY- SEGMENTECTOMY Right 10/27/2022   Procedure: XI ROBOTIC ASSISTED THORACOSCOPY-RIGHT LOWER LOBE SUPERIOR SEGMENTECTOMY;  Surgeon: Kerrin Elspeth BROCKS, MD;  Location: [facility] OR;  Service: Thoracic;  Laterality: Right;    SOCIAL HISTORY: Social History   Socioeconomic History   Marital status: Divorced    Spouse name: Not on file   Number of children: 4   Years of education: Not on file   Highest education level: 8th grade  Occupational History   Not on file  Tobacco Use   Smoking status: Every Day    Current packs/day: 0.25    Average packs/day: 0.3 packs/day for 40.0 years (10.0 ttl pk-yrs)    Types: Cigarettes   Smokeless tobacco: Former    Types: Chew   Tobacco comments:    Reports smoking 1/2 pack a day and trying to cut back   Vaping Use   Vaping status: Former  Substance and Sexual Activity   Alcohol use: No    Alcohol/week: 0.0 standard drinks of alcohol   Drug use: No   Sexual activity: Not Currently  Other Topics Concern   Not on file  Social History Narrative   Not on file   Social Drivers of Health   Financial Resource Strain: Low Risk  (08/17/2020)   Overall Financial Resource Strain (CARDIA)    Difficulty of Paying Living Expenses: Not very hard  Food Insecurity: Food Insecurity Present (12/14/2022)   Hunger Vital Sign    Worried About Running Out of Food in the Last Year: Often true    Ran Out of Food in the Last Year: Often true  Transportation Needs: No Transportation Needs (12/14/2022)   PRAPARE - Administrator, Civil Service (Medical): No    Lack of Transportation (Non-Medical): No  Physical Activity: Inactive (05/23/2018)   Exercise Vital Sign    Days of Exercise per Week: 0 days    Minutes of Exercise per Session: 0 min  Stress: Stress Concern Present (05/23/2018)   [employer] of Occupational Health - Occupational Stress Questionnaire    Feeling of Stress : Very much  Social Connections: Unknown (05/23/2018)   Social Connection and Isolation Panel    Frequency of Communication with Friends and Family: Not on file    Frequency of Social Gatherings with Friends and Family: Not  on file    Attends Religious Services: Never    Active Member of Clubs or Organizations: No    Attends Banker Meetings: Never    Marital Status: Divorced  Catering manager Violence: Not At Risk (12/14/2022)   Humiliation, Afraid, Rape, and Kick questionnaire    Fear of Current or Ex-Partner: No    Emotionally Abused: No    Physically Abused: No    Sexually Abused: No    FAMILY HISTORY: Family History  Problem Relation  Age of Onset   Hypertension Mother    Diabetes type II Mother    Diabetes Mother    COPD Father    Lung cancer Father    Heart disease Father    Diabetes Sister    Anxiety disorder Sister    Depression Sister    Diabetes Sister    Anxiety disorder Sister    Depression Sister    Diabetes Sister    Anxiety disorder Sister    Depression Sister    Diabetes Sister    Anxiety disorder Sister    Depression Sister    Diabetes Brother    Anxiety disorder Brother    Depression Brother    Diabetes Maternal Aunt    Colon cancer Maternal Uncle     ALLERGIES:  is allergic to onion, norco [hydrocodone-acetaminophen ], nickel, and nicoderm [nicotine ].  MEDICATIONS:  Current Outpatient Medications  Medication Sig Dispense Refill   Accu-Chek Softclix Lancets lancets Use 1 lancet to check glucose once daily and as needed for diabetes 100 each 12   acetaminophen  (TYLENOL ) 500 MG tablet Take 2 tablets (1,000 mg total) by mouth 4 (four) times daily. 30 tablet 0   albuterol  (VENTOLIN  HFA) 108 (90 Base) MCG/ACT inhaler INHALE 2 PUFFS INTO THE LUNGS EVERY 6 HOURS AS MEEDED FOR WHEEZING OR SHORTNESS OF BREATH 18 g 1   aspirin  EC 81 MG tablet Take 81 mg by mouth daily at 3 pm.      atorvastatin  (LIPITOR) 40 MG tablet Take 1 tablet (40 mg total) by mouth daily. 90 tablet 3   Blood Glucose Monitoring Suppl (ACCU-CHEK GUIDE ME) w/Device KIT Use as directed. E11.65 1 kit 0   budesonide-glycopyrrolate -formoterol  (BREZTRI  AEROSPHERE) 160-9-4.8 MCG/ACT AERO inhaler Inhale  2 puffs into the lungs 2 (two) times daily. 11 g 0   buPROPion  (WELLBUTRIN  XL) 150 MG 24 hr tablet Take 1 tablet (150 mg total) by mouth daily. Take along with 300 mg daily 90 tablet 3   buPROPion  (WELLBUTRIN  XL) 300 MG 24 hr tablet Take 1 tablet (300 mg total) by mouth daily. Take along with 150 mg daily 90 tablet 3   Continuous Glucose Sensor (DEXCOM G7 SENSOR) MISC Use one every 10 days for uncontrolled diabetes E11.65 3 each 11   cyclobenzaprine  (FLEXERIL ) 10 MG tablet Take 0.5-1 tablets (5-10 mg total) by mouth 3 (three) times daily as needed for muscle spasms. 60 tablet 2   dapagliflozin  propanediol (FARXIGA ) 10 MG TABS tablet Take 1 tablet (10 mg total) by mouth daily before breakfast. 90 tablet 2   DULoxetine  (CYMBALTA ) 30 MG capsule Take 1 capsule (30 mg total) by mouth daily. Take along with 60 mg daily , total of 90 mg daily 90 capsule 3   DULoxetine  (CYMBALTA ) 60 MG capsule Take 1 capsule (60 mg total) by mouth daily. Take along with 30 mg daily 90 capsule 3   ezetimibe  (ZETIA ) 10 MG tablet Take 1 tablet by mouth once daily 90 tablet 0   famotidine  (PEPCID ) 20 MG tablet Take 1 tablet by mouth twice daily 180 tablet 0   glucose blood (ACCU-CHEK GUIDE) test strip Use 1 test strip to check fasting glucose once daily and as need for diabetes 100 each 12   insulin  glargine, 2 Unit Dial , (TOUJEO  MAX SOLOSTAR) 300 UNIT/ML Solostar Pen Inject 50 Units into the skin daily. 15 mL 3   insulin  lispro (HUMALOG  KWIKPEN) 200 UNIT/ML KwikPen INJECT INSULIN  INTO THE SKIN WITH MEALS THREE TIMES DAILY PER SLIDING SCALE PROVIDED. MAX  DAILY DOSE IS 42 UNITS 6 mL 3   Insulin  Pen Needle 32G X 4 MM MISC Use with daily dose insulin  and with sliding scale insulin  as needed. 100 each 5   Melatonin 10 MG CAPS Take 30 mg by mouth at bedtime.     meloxicam  (MOBIC ) 15 MG tablet Take 15 mg by mouth daily.     metoprolol  tartrate (LOPRESSOR ) 100 MG tablet Take 1 tablet by mouth twice daily 180 tablet 0   nitroGLYCERIN   (NITROSTAT ) 0.4 MG SL tablet Place 1 tablet (0.4 mg total) under the tongue every 5 (five) minutes x 3 doses as needed for chest pain. 30 tablet 1   oxyCODONE  (OXY IR/ROXICODONE ) 5 MG immediate release tablet Take 1 tablet (5 mg total) by mouth 2 (two) times daily as needed for severe pain (pain score 7-10). 40 tablet 0   OZEMPIC , 1 MG/DOSE, 4 MG/3ML SOPN INJECT 1MG   SUBCUTANEOUSLY ONCE A WEEK 9 mL 0   pantoprazole  (PROTONIX ) 40 MG tablet Take 1 tablet by mouth once daily 90 tablet 0   pregabalin  (LYRICA ) 50 MG capsule Take 1 capsule (50 mg total) by mouth 3 (three) times daily. For 7 days then increase to 2 capsules PO 3 times daily 159 capsule 0   rOPINIRole  (REQUIP ) 0.25 MG tablet Take 1 tablet (0.25 mg total) by mouth 3 (three) times daily. 90 tablet 1   tamsulosin  (FLOMAX ) 0.4 MG CAPS capsule Take 1 capsule by mouth once daily 90 capsule 0   temazepam  (RESTORIL ) 30 MG capsule Take 1 capsule (30 mg total) by mouth at bedtime as needed for sleep. 30 capsule 5   varenicline  (CHANTIX ) 1 MG tablet Take 1 tablet by mouth twice daily 60 tablet 3   No current facility-administered medications for this visit.    REVIEW OF SYSTEMS:   Review of Systems  Constitutional:  Negative for chills, fever, malaise/fatigue and weight loss.  HENT:  Negative for hearing loss, nosebleeds, sore throat and tinnitus.   Eyes:  Negative for blurred vision and double vision.  Respiratory:  Negative for cough, hemoptysis, shortness of breath and wheezing.   Cardiovascular:  Negative for chest pain, palpitations and leg swelling.  Gastrointestinal:  Negative for abdominal pain, blood in stool, constipation, diarrhea, melena, nausea and vomiting.  Genitourinary:  Negative for dysuria and urgency.  Musculoskeletal:  Negative for back pain, falls, joint pain and myalgias.  Skin:  Negative for itching and rash.  Neurological:  Negative for dizziness, tingling, sensory change, loss of consciousness, weakness and headaches.   Endo/Heme/Allergies:  Negative for environmental allergies. Does not bruise/bleed easily.  Psychiatric/Behavioral:  Negative for depression. The patient is not nervous/anxious and does not have insomnia.   All other systems were reviewed with the patient and are negative.  PHYSICAL EXAMINATION: ECOG PERFORMANCE STATUS: 1 - Symptomatic but completely ambulatory  Vitals:   11/15/23 1322  BP: 117/79  Pulse: 97  Resp: 16  Temp: 98 F (36.7 C)  SpO2: 97%   Filed Weights   11/15/23 1322  Weight: 205 lb (93 kg)   General: Well-developed, well-nourished, no acute distress. Eyes: Pink conjunctiva, anicteric sclera. Lungs: Clear to auscultation bilaterally.  No audible wheezing or coughing Heart: Regular rate and rhythm.  Abdomen: Soft, nontender, nondistended.  Musculoskeletal: No edema, cyanosis, or clubbing. Neuro: Alert, answering all questions appropriately. Cranial nerves grossly intact. Skin: No rashes or petechiae noted. Psych: Normal affect.   LABORATORY DATA:  I have reviewed the data as listed Lab Results  Component Value Date   WBC 8.9 11/15/2023   HGB 14.3 11/15/2023   HCT 41.6 11/15/2023   MCV 89.7 11/15/2023   PLT 208 11/15/2023   Recent Labs    01/24/23 1422 01/24/23 1506 05/15/23 1249 07/05/23 1211 09/15/23 1659 11/05/23 1041 11/07/23 1116  NA 135   < > 134* 135 140  --  142  K 5.0   < > 4.3 3.8 3.6  --  4.8  CL 95*   < > 101 100 104  --  103  CO2 22   < > 23 23 24   --   --   GLUCOSE 335*   < > 153* 127* 126*  --  162*  BUN 14   < > 12 9 13   --  11  CREATININE 1.13   < > 0.95 0.85 1.21 0.90 0.81  CALCIUM  9.8   < > 9.1 9.9 9.3  --  10.5*  GFRNONAA  --   --  >60 >60 >60  --   --   PROT 7.0  --  7.7  --  7.8  --  7.5  ALBUMIN  4.3  --  4.1  --  4.2  --  4.5  AST 18  --  18  --  22  --  16  ALT 18  --  18  --  17  --   --   ALKPHOS 146*  --  104  --  100  --  146*  BILITOT 0.2  --  0.5  --  0.4  --  0.2   < > = values in this interval not  displayed.    RADIOGRAPHIC STUDIES: I have personally reviewed the radiological images as listed and agreed with the findings in the report. DG Si Joints Result Date: 11/12/2023 CLINICAL DATA:  Right-sided hip pain EXAM: BILATERAL SACROILIAC JOINTS - 3+ VIEW COMPARISON:  CT 09/15/2023 FINDINGS: Minimal right SI joint degenerative change, but with patent joint spaces. Pubic symphysis and rami appear intact IMPRESSION: Minimal right SI joint degenerative change. Electronically Signed   By: Luke Bun M.D.   On: 11/12/2023 22:30   DG Cervical Spine With Flex & Extend Result Date: 11/12/2023 CLINICAL DATA:  Chronic neck pain EXAM: CERVICAL SPINE COMPLETE WITH FLEXION AND EXTENSION VIEWS COMPARISON:  None Available. FINDINGS: Trace retrolisthesis C3 on C4. Trace anterolisthesis C5 on C6. Vertebral body heights are maintained. Moderate disc space narrowing at C3-C4 and C6-C7. No suspicious change in alignment with flexion or extension. Carotid vascular calcification. Dens and lateral masses are within normal limits. Possible bilateral foraminal narrowing at C3-C4 and C6-C7. Surgical plate and fixating screws in the right clavicle IMPRESSION: Trace retrolisthesis C3 on C4 and trace anterolisthesis C5 on C6. Moderate disc space narrowing at C3-C4 and C6-C7. Electronically Signed   By: Luke Bun M.D.   On: 11/12/2023 22:29   DG HIP UNILAT W OR W/O PELVIS 2-3 VIEWS RIGHT Result Date: 11/12/2023 EXAM: 3 VIEW(S) XRAY OF THE RIGHT HIP 11/07/2023 02:04:37 PM COMPARISON: None available. CLINICAL HISTORY: Right hip pain/arthralgia. Chronic pain being addressed at new pain management facility to establish care. FINDINGS: BONES AND JOINTS: No acute fracture or focal osseous lesion. The hip joint is maintained. No significant degenerative changes. SOFT TISSUES: The soft tissues are unremarkable. IMPRESSION: 1. No significant abnormality in the right hip or visualized pelvis. Electronically signed by: Pinkie Pebbles MD 11/12/2023 12:51 PM EDT RP Workstation: [id number]   DG Lumbar Spine Complete W/Bend Result Date:  11/12/2023 EXAM: 6 VIEW(S) XRAY OF THE LUMBAR SPINE 11/07/2023 02:04:37 PM COMPARISON: None available. CLINICAL HISTORY: Low back pain. Chronic pain being addressed at new pain management facility to establish care. FINDINGS: LUMBAR SPINE: BONES: No acute fracture. No aggressive appearing osseous lesion. Alignment is normal. DISCS AND DEGENERATIVE CHANGES: Mild degenerative changes of the visualized thoracolumbar spine, most prominent at L3-4. SOFT TISSUES: Cholecystectomy clips. No acute abnormality. IMPRESSION: 1. Mild degenerative changes of the visualized thoracolumbar spine, most prominent at L3-4. Electronically signed by: Pinkie Pebbles MD 11/12/2023 12:33 PM EDT RP Workstation: [id number]   CT Chest W Contrast Result Date: 11/07/2023 CLINICAL DATA:  Lung cancer, surveillance, six-month follow-up. * Tracking Code: BO * EXAM: CT CHEST WITH CONTRAST TECHNIQUE: Multidetector CT imaging of the chest was performed during intravenous contrast administration. RADIATION DOSE REDUCTION: This exam was performed according to the departmental dose-optimization program which includes automated exposure control, adjustment of the mA and/or kV according to patient size and/or use of iterative reconstruction technique. CONTRAST:  75mL OMNIPAQUE  IOHEXOL  300 MG/ML  SOLN COMPARISON:  Multiple priors including CT April 30, 2023 FINDINGS: Cardiovascular: Aortic atherosclerosis. Normal size heart. Coronary artery calcifications. No significant pericardial effusion/thickening. Mediastinum/Nodes: No suspicious thyroid  nodule. No pathologically enlarged mediastinal, hilar or axillary lymph nodes. Diffuse symmetric esophageal wall thickening. Lungs/Pleura: Similar changes of right lower lobe superior segmentectomy, without evidence of local recurrence. Stable scattered tiny pulmonary nodules. No new suspicious  pulmonary nodules or masses. For reference: -solid 4 mm left lower lobe pulmonary nodule on image 101/4, unchanged -solid 3 mm right lower lobe pulmonary nodule on image 87/4, unchanged. Similar scarring/atelectasis in the right lung base. Upper Abdomen: Partially visualized fluid density left renal lesion is compatible with a cyst. No suspicious adrenal nodule/mass. Musculoskeletal: No aggressive lytic or blastic lesion of bone. Multilevel degenerative changes spine with bridging anterior osteophytes. IMPRESSION: 1. Similar changes of right lower lobe superior segmentectomy, without evidence of local recurrence. 2. Stable scattered tiny pulmonary nodules. No new suspicious pulmonary nodules or masses. 3. Diffuse symmetric esophageal wall thickening, suggestive of esophagitis. 4. Aortic atherosclerosis. Aortic Atherosclerosis (ICD10-I70.0). Electronically Signed   By: Reyes Holder M.D.   On: 11/07/2023 13:29   ASSESSMENT & PLAN:  Raymond [name] 58 y.o. male with pmh of smoking,Diabetes, depression, anxiety, OSA, hypertension, hyperlipidemia referred to medical oncology for surveillance of stage Ia right lung adenocarcinoma detected on low-dose CT lung screening.  # Right lung adenocarcinoma, stage IA -Detected on low-dose CT lung screening.  PET scan from 09/20/2022 showed 1.5 cm nodule in the superior segment of right lower lobe.  - s/p Robotic assisted VATS with right lower lobe superior segmentectomy, node dissection and intercostal nerve block with Dr. Kerrin on 10/27/2022.  Pathology report showed 1.2 x 1 x 1 cm invasive nonmucinous adenocarcinoma with lipidic component, margins negative, no LVI, 0/15 lymph nodes negative.  pT1b, N0  - No role for adjuvant chemotherapy.  Will do surveillance based on NCCN guidelines.   -CT chest with contrast done on 04/30/2023 showed no signs of locally recurrent disease or metastatic disease.  Stable 4 mm solid nodule in the left lower lobe.  - repeat ct  chest w contrast 11/05/23 showed similar changes of the RLL w/o evidence of recurrence. Incidental findings as below.   Tobacco Use - On Chantix .  - Patient continues to smoke. But has cut down. Discussed how to manage skin irritation from patch. Also reviewed use of short acting nicotine  replacement and signs of nicotine  withdrawals. Encouraged  setting new rules to help cut down. Discussed bite and pocket method for gum.    # Localized right lower chest pain - ongoing since surgery in July 2024. Unchanged - on tramadol  50 mg BID. Tylenol  PM. Mild improvement. Allergic to Vicodin. Prescribed by primary.   Disposition:  Ct chest 6 mo Week later- see MD to establish care for lung cancer surveillance- [place]   The total time spent in the appointment was 30 minutes encounter with patients including review of chart and various tests results, discussions about plan of care and coordination of care plan   All questions were answered. The patient knows to call the clinic with any problems, questions or concerns. No barriers to learning was detected.  Tinnie KANDICE Dawn, Raymond [name] 11/15/2023

## 2023-11-16 ENCOUNTER — Encounter
Admission: RE | Admit: 2023-11-16 | Discharge: 2023-11-16 | Disposition: A | Discharge status: Home / Self Care | Source: Ambulatory Visit | Attending: Internal Medicine | Admitting: Internal Medicine

## 2023-11-16 DIAGNOSIS — R0602 Shortness of breath: Secondary | ICD-10-CM | POA: Insufficient documentation

## 2023-11-16 LAB — NM MYOCAR MULTI W/SPECT W/WALL MOTION / EF
LV dias vol: 89 mL (ref 62–150)
LV sys vol: 31 mL (ref 4.2–5.8)
Nuc Stress EF: 65 %
Peak HR: 113 {beats}/min
Rest HR: 69 {beats}/min
Rest Nuclear Isotope Dose: 10.8 mCi
SDS: 0
SRS: 3
SSS: 2
ST Depression (mm): 0 mm
Stress Nuclear Isotope Dose: 31.1 mCi
TID: 1.04

## 2023-11-16 MED ORDER — REGADENOSON 0.4 MG/5ML IV SOLN
0.4000 mg | Freq: Once | INTRAVENOUS | Status: AC
  Administered 2023-11-16: 0.4 mg via INTRAVENOUS

## 2023-11-16 MED ORDER — TECHNETIUM TC 99M TETROFOSMIN IV KIT
10.0000 | PACK | Freq: Once | INTRAVENOUS | Status: AC | PRN
  Administered 2023-11-16: 10.76 via INTRAVENOUS

## 2023-11-16 MED ORDER — TECHNETIUM TC 99M TETROFOSMIN IV KIT
31.0600 | PACK | Freq: Once | INTRAVENOUS | Status: AC | PRN
  Administered 2023-11-16: 31.06 via INTRAVENOUS

## 2023-11-19 ENCOUNTER — Other Ambulatory Visit: Payer: Self-pay | Admitting: Nurse Practitioner

## 2023-11-19 ENCOUNTER — Telehealth: Payer: Self-pay

## 2023-11-19 DIAGNOSIS — J439 Emphysema, unspecified: Secondary | ICD-10-CM

## 2023-11-19 NOTE — Progress Notes (Signed)
   11/19/2023  Patient ID: [place] FORBES Bruch, male   DOB: 29-Nov-1965, 58 y.o.   MRN: [id number]  Attempted to contact patient for medication management/review. Left HIPAA compliant message for patient to return my call at their convenience.   Will follow up with patient.  Thank you for allowing pharmacy to be a part of this patient's care.  Dorcas Solian, PharmD Clinical Pharmacist Cell: [phone]

## 2023-11-24 ENCOUNTER — Encounter: Payer: Self-pay | Admitting: Nurse Practitioner

## 2023-11-24 DIAGNOSIS — K648 Other hemorrhoids: Secondary | ICD-10-CM | POA: Insufficient documentation

## 2023-11-27 ENCOUNTER — Telehealth: Payer: Self-pay | Admitting: Nurse Practitioner

## 2023-11-27 NOTE — Telephone Encounter (Signed)
 Gastroenterology referral sent via [facility] to [facility].  Notified patient. Gave telephone# [phone]

## 2023-11-27 NOTE — Progress Notes (Unsigned)
 PROVIDER NOTE: Interpretation of information contained herein should be left to medically-trained personnel. Specific patient instructions are provided elsewhere under Patient Instructions section of medical record. This document was created in part using AI and STT-dictation technology, any transcriptional errors that may result from this process are unintentional.  Patient: Raymond [name]  Service: E/M   PCP: Liana Fish, NP  DOB: [birth year]  DOS: 11/28/2023  Provider: Eric DELENA Como, MD  MRN: [id number]  Delivery: Face-to-face  Specialty: Interventional Pain Management  Type: Established Patient  Setting: Ambulatory outpatient facility  Specialty designation: 09  Referring Prov.: Liana Fish, NP  Location: Outpatient office facility       Primary Reason(s) for Visit: Encounter for evaluation before starting new chronic pain management plan of care (Level of risk: moderate) CC: No chief complaint on file.  HPI  Raymond [name] is a 58 y.o. year old, male patient, who comes today for a follow-up evaluation to review the test results and decide on a treatment plan. 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; OSA (obstructive sleep apnea); Uncontrolled type 2 diabetes mellitus with hypoglycemia without coma (HCC); Shortness of breath; Cigarette nicotine  dependence without complication; Encounter for general adult medical examination with abnormal findings; Uncontrolled type 2 diabetes mellitus with hyperglycemia (HCC); Onychomycosis with ingrown toenail; Dysuria; Clotted blood in stool; Diarrhea; Anxiety disorder; Diastasis recti; Enterocolitis; Left upper quadrant abdominal pain of unknown etiology; Generalized abdominal pain; Local superficial swelling, mass or lump; Calculus of gallbladder with acute on chronic cholecystitis without  obstruction; Encounter for other preprocedural examination; PTSD (post-traumatic stress disorder); MDD (major depressive disorder), recurrent episode, moderate (HCC); Insomnia due to mental disorder; Right upper quadrant pain; Tobacco use disorder; MDD (major depressive disorder), recurrent, in partial remission (HCC); MDD (major depressive disorder), recurrent, in full remission (HCC); Noncompliance with diabetes treatment; Angina pectoris (HCC); MDD (major depressive disorder), recurrent episode, mild (HCC); Barrett's esophagus without dysplasia; Gastric erythema; Special screening for malignant neoplasms, colon; Polyp of colon; Mixed hyperlipidemia; Pulmonary emphysema (HCC); Insomnia due to medical condition; At risk for prolonged QT interval syndrome; Hypersomnia with sleep apnea; Abnormal LFTs; Aortic atherosclerosis (HCC); Lung nodule; Right lower lobe pulmonary nodule; S/P robot-assisted Video Assisted Thoracoscopy with Right Lower Lobe Superior Segmentectomy, Node Dissection, and Intercostal Nerve Block; Pleural effusion on right; Primary adenocarcinoma of right lung (HCC); Type 2 diabetes mellitus with hyperglycemia, with long-term current use of insulin  (HCC); Diabetic polyneuropathy associated with type 2 diabetes mellitus (HCC); Diabetes mellitus (HCC); Chronic diastolic heart failure (HCC); Obesity, morbid (HCC); Lumbar radiculopathy, chronic; Spinal stenosis of lumbar region with neurogenic claudication; Chronic pain syndrome; Pharmacologic therapy; Disorder of skeletal system; Problems influencing health status; History of lung cancer (Right); Chronic low back pain (Midline) w/o sciatica; Chronic low back pain (Right) w/o sciatica; Low back pain of over 3 months duration; Low back pain potentially associated with radiculopathy; Low back pain radiating to right leg; Multifactorial low back pain; Chronic hip pain (Right); Chronic sacroiliac joint pain (Right); Somatic dysfunction of right sacroiliac  joint; Lumbar facet joint pain; Chronic neck pain; Cervicalgia; Cervicogenic headache; and Internal hemorrhoids on their problem list. His primarily concern today is the No chief complaint on file.  Pain Assessment: Location:     Radiating:   Onset:   Duration:   Quality:   Severity:  /10 (subjective, self-reported pain score)  Effect on ADL:   Timing:   Modifying factors:  BP:    HR:    [name] comes in today for a follow-up visit after his initial evaluation on 11/07/2023. Today we went over the results of his tests. These were explained in Layman's terms. During today's appointment we went over my diagnostic impression, as well as the proposed treatment plan.  Review of initial evaluation (11/07/2023): Raymond [name] is a 58 year old male with degenerative disc disease who presents with chronic lower back pain.  He has been experiencing lower back pain for approximately one year, primarily located in the lower back and radiating to the right hip and down the side of the right leg, stopping at the knee. The back pain is worse than the leg pain and is constant, while the leg pain is intermittent. He has not undergone any back surgery but has tried physical therapy for three months, three times a week, which worsened his condition. A CT scan conducted a few weeks ago revealed degenerative disc disease at the L2-L3 and L3-L4 levels. He has been prescribed pain medication by his primary care doctor, but it is not effective. He has not received any nerve blocks or injections for the back pain. He reports a sharp pain in the center of his back.  He has a history of diabetes for five years, managed with insulin  and Farxiga . He experiences numbness in both feet, which he attributes to his diabetes. No hospitalizations for diabetic complications.  He has a history of lung cancer, for which part of his right lung was removed. He continues to smoke despite cutting back. He is scheduled for a  follow-up with his cancer doctor and recently had a CT scan.  He reports occasional neck pain on the right side, which he associates with migraines. The neck pain is not as frequent as before but still causes migraines occasionally. The migraines are primarily on the right side, extending to the top of the head. Neck movements sometimes cause cracking and popping sounds.  Review of diagnostic test ordered on 11/07/2023:  Diagnostic lab work: *** Diagnostic imaging: ***  Discussed the use of AI scribe software for clinical note transcription with the patient, who gave verbal consent to proceed.  History of Present Illness          Patient presented with interventional treatment options. Mr. Brockman was informed that I will not be providing medication management. Pharmacotherapy evaluation including recommendations may be offered, if specifically requested.   Controlled Substance Pharmacotherapy Assessment REMS (Risk Evaluation and Mitigation Strategy)  Opioid Analgesic: None MME/day: 0 mg/day   Pill Count: None expected due to no prior prescriptions written by our practice. No notes on file  Pharmacokinetics: Liberation and absorption (onset of action): WNL Distribution (time to peak effect): WNL Metabolism and excretion (duration of action): WNL         Pharmacodynamics: Desired effects: Analgesia: Mr. Makar reports >50% benefit. Functional ability: Patient reports that medication allows him to accomplish basic ADLs Clinically meaningful improvement in function (CMIF): Sustained CMIF goals met Perceived effectiveness: Described as relatively effective, allowing for increase in activities of daily living (ADL) Undesirable effects: Side-effects or Adverse reactions: None reported Monitoring: [place] PMP: PDMP reviewed during this encounter. Online review of the past 33-month period previously conducted. Not applicable at this point since we have not taken over the patient's medication  management yet. List of other Serum/Urine Drug Screening Test(s):  Lab Results  Component Value Date   COCAINSCRNUR NONE DETECTED 03/30/2015   COCAINSCRNUR NEGATIVE 07/31/2013  COCAINSCRNUR NEGATIVE 02/06/2012   COCAINSCRNUR NEGATIVE 01/29/2012   COCAINSCRNUR NEGATIVE 01/02/2012   COCAINSCRNUR NEGATIVE 10/14/2011   THCU NONE DETECTED 03/30/2015   THCU NEGATIVE 07/31/2013   THCU NEGATIVE 02/06/2012   THCU NEGATIVE 01/29/2012   THCU NEGATIVE 01/02/2012   THCU NEGATIVE 10/14/2011   ETH 9 (H) 03/30/2015   List of all UDS test(s) done:  Lab Results  Component Value Date   SUMMARY FINAL 11/07/2023   Last UDS on record: Summary  Date Value Ref Range Status  11/07/2023 FINAL  Final    Comment:    ==================================================================== Compliance Drug Analysis, Ur ==================================================================== Test                             Result       Flag       Units  Drug Present and Declared for Prescription Verification   Oxazepam                       658          EXPECTED   ng/mg creat   Temazepam                       >3030        EXPECTED   ng/mg creat    Oxazepam and temazepam  are expected metabolites of diazepam.    Oxazepam is also an expected metabolite of other benzodiazepine    drugs, including chlordiazepoxide, prazepam, clorazepate, halazepam,    and temazepam .  Oxazepam and temazepam  are available as scheduled    prescription medications.    Cyclobenzaprine                 PRESENT      EXPECTED   Desmethylcyclobenzaprine       PRESENT      EXPECTED    Desmethylcyclobenzaprine is an expected metabolite of    cyclobenzaprine .    Metoprolol                      PRESENT      EXPECTED  Drug Present not Declared for Prescription Verification   Gabapentin                      PRESENT      UNEXPECTED  Drug Absent but Declared for Prescription Verification   Oxycodone                       Not Detected UNEXPECTED  ng/mg creat   Pregabalin                      Not Detected UNEXPECTED   Bupropion                       Not Detected UNEXPECTED   Duloxetine                      Not Detected UNEXPECTED   Acetaminophen                   Not Detected UNEXPECTED    Acetaminophen , as indicated in the declared medication list, is not    always detected even when used as directed.    Salicylate                     Not Detected UNEXPECTED  Aspirin , as indicated in the declared medication list, is not always    detected even when used as directed.  ==================================================================== Test                      Result    Flag   Units      Ref Range   Creatinine              66               mg/dL      >=79 ==================================================================== Declared Medications:  The flagging and interpretation on this report are based on the  following declared medications.  Unexpected results may arise from  inaccuracies in the declared medications.   **Note: The testing scope of this panel includes these medications:   Bupropion  (Wellbutrin  XL)  Cyclobenzaprine  (Flexeril )  Duloxetine  (Cymbalta )  Metoprolol  (Lopressor )  Oxycodone  (Roxicodone )  Pregabalin  (Lyrica )  Temazepam  (Restoril )   **Note: The testing scope of this panel does not include small to  moderate amounts of these reported medications:   Acetaminophen  (Tylenol )  Aspirin    **Note: The testing scope of this panel does not include the  following reported medications:   Albuterol  (Ventolin  HFA)  Atorvastatin  (Lipitor)  Budesonide (Breztri  Aerosphere)  Dapagliflozin  (Farxiga )  Ezetimibe  (Zetia )  Famotidine  (Pepcid )  Formoterol  (Breztri  Aerosphere)  Glycopyrrolate  (Breztri  Aerosphere)  Insulin  (Toujeo )  Melatonin  Meloxicam  (Mobic )  Nitroglycerin  (Nitrostat )  Pantoprazole  (Protonix )  Ropinirole  (Requip )  Semaglutide  (Ozempic )  Tamsulosin  (Flomax )  Varenicline   (Chantix ) ==================================================================== For clinical consultation, please call [phone]. ====================================================================    UDS interpretation: No unexpected findings.          Medication Assessment Form: Not applicable. No opioids. Treatment compliance: Not applicable Risk Assessment Profile: Aberrant behavior: See initial evaluations. None observed or detected today Comorbid factors increasing risk of overdose: See initial evaluation. No additional risks detected today Opioid risk tool (ORT):      No data to display          ORT Scoring interpretation table:  Score <3 = Low Risk for SUD  Score between 4-7 = Moderate Risk for SUD  Score >8 = High Risk for Opioid Abuse   Risk of substance use disorder (SUD): Low  Risk Mitigation Strategies:  Patient opioid safety counseling: No controlled substances prescribed. Patient-Prescriber Agreement (PPA): No agreement signed.  Controlled substance notification to other providers: None required. No opioid therapy.  Pharmacologic Plan: Non-opioid analgesic therapy offered. Interventional alternatives discussed.             Laboratory Chemistry Profile   Renal Lab Results  Component Value Date   BUN 19 11/15/2023   CREATININE 1.04 11/15/2023   BCR 14 11/07/2023   GFRAA >60 01/25/2019   GFRNONAA >60 11/15/2023   SPECGRAV 1.010 03/06/2023   PHUR 5.0 03/06/2023   PROTEINUR NEGATIVE 09/15/2023     Electrolytes Lab Results  Component Value Date   NA 136 11/15/2023   K 4.6 11/15/2023   CL 103 11/15/2023   CALCIUM  9.6 11/15/2023   MG 2.1 11/07/2023     Hepatic Lab Results  Component Value Date   AST 17 11/15/2023   ALT 19 11/15/2023   ALBUMIN  4.2 11/15/2023   ALKPHOS 121 11/15/2023   LIPASE 35 09/15/2023     ID Lab Results  Component Value Date   HIV Non Reactive 11/20/2022   SARSCOV2NAA NEGATIVE 10/25/2022   STAPHAUREUS NEGATIVE  10/25/2022   MRSAPCR NEGATIVE 10/25/2022  Bone Lab Results  Component Value Date   VD25OH 24.5 (L) 11/11/2021   25OHVITD1 28 (L) 11/07/2023   25OHVITD2 <1.0 11/07/2023   25OHVITD3 28 11/07/2023     Endocrine Lab Results  Component Value Date   GLUCOSE 257 (H) 11/15/2023   GLUCOSEU >=500 (A) 09/15/2023   HGBA1C 6.7 (A) 08/08/2023   TSH 1.890 08/17/2020   FREET4 1.08 08/17/2020     Neuropathy Lab Results  Component Value Date   VITAMINB12 150 (L) 11/07/2023   HGBA1C 6.7 (A) 08/08/2023   HIV Non Reactive 11/20/2022     CNS No results found for: COLORCSF, APPEARCSF, RBCCOUNTCSF, WBCCSF, POLYSCSF, LYMPHSCSF, EOSCSF, PROTEINCSF, GLUCCSF, JCVIRUS, CSFOLI, IGGCSF, LABACHR, ACETBL   Inflammation (CRP: Acute  ESR: Chronic) Lab Results  Component Value Date   CRP 3 11/07/2023   ESRSEDRATE 12 11/07/2023   LATICACIDVEN 2.1 (HH) 05/31/2017     Rheumatology Lab Results  Component Value Date   RF <10.0 02/06/2022   ANA Negative 02/06/2022     Coagulation Lab Results  Component Value Date   INR 1.0 10/25/2022   LABPROT 13.4 10/25/2022   APTT 25 10/25/2022   PLT 208 11/15/2023   DDIMER 0.36 08/23/2022     Cardiovascular Lab Results  Component Value Date   BNP 97 07/31/2013   CKTOTAL 344 07/21/2017   CKMB 0.6 07/31/2013   TROPONINI <0.03 07/21/2017   HGB 14.3 11/15/2023   HCT 41.6 11/15/2023     Screening Lab Results  Component Value Date   SARSCOV2NAA NEGATIVE 10/25/2022   STAPHAUREUS NEGATIVE 10/25/2022   MRSAPCR NEGATIVE 10/25/2022   HIV Non Reactive 11/20/2022     Cancer No results found for: CEA, CA125, LABCA2   Allergens No results found for: ALMOND, APPLE, ASPARAGUS, AVOCADO, BANANA, BARLEY, BASIL, BAYLEAF, GREENBEAN, LIMABEAN, WHITEBEAN, BEEFIGE, REDBEET, BLUEBERRY, BROCCOLI, CABBAGE, MELON, CARROT, CASEIN, CASHEWNUT, CAULIFLOWER, CELERY     Note: Lab results  reviewed.  Recent Diagnostic Imaging Review  Cervical Imaging: Cervical DG Bending/F/E views: Results for orders placed during the hospital encounter of 11/07/23 DG Cervical Spine With Flex & Extend  Narrative CLINICAL DATA:  Chronic neck pain  EXAM: CERVICAL SPINE COMPLETE WITH FLEXION AND EXTENSION VIEWS  COMPARISON:  None Available.  FINDINGS: Trace retrolisthesis C3 on C4. Trace anterolisthesis C5 on C6. Vertebral body heights are maintained. Moderate disc space narrowing at C3-C4 and C6-C7. No suspicious change in alignment with flexion or extension. Carotid vascular calcification. Dens and lateral masses are within normal limits. Possible bilateral foraminal narrowing at C3-C4 and C6-C7. Surgical plate and fixating screws in the right clavicle  IMPRESSION: Trace retrolisthesis C3 on C4 and trace anterolisthesis C5 on C6. Moderate disc space narrowing at C3-C4 and C6-C7.   Electronically Signed By: Luke Bun M.D. On: 11/12/2023 22:29  Shoulder Imaging: Shoulder-L DG: Results for orders placed during the hospital encounter of 11/19/21 DG Shoulder Left  Narrative CLINICAL DATA:  Left rib pain  EXAM: LEFT SHOULDER - 2+ VIEW  COMPARISON:  None Available.  FINDINGS: There is no evidence of fracture or dislocation. There is no evidence of arthropathy or other focal bone abnormality. Soft tissues are unremarkable.  IMPRESSION: Negative.   Electronically Signed By: Mabel Converse D.O. On: 11/19/2021 12:04  Thoracic Imaging: Thoracic DG w/swimmers view: Results for orders placed during the hospital encounter of 02/06/22 DG Thoracic Spine W/Swimmers  Narrative CLINICAL DATA:  Mid and low back pain. Left knee pain. Symptoms for several months.  EXAM: THORACIC SPINE - 3 VIEWS  COMPARISON:  Chest CT, 08/29/2021.  FINDINGS: No fracture, bone lesion or spondylolisthesis.  Mild loss of disc height with anterior endplate osteophytes throughout most  of the thoracic spine, midthoracic spine osteophytes bridging.  Soft tissues are unremarkable.  Stable appearance from the prior chest CT.  IMPRESSION: 1. No fracture or acute finding. 2. Disc degenerative changes as detailed.   Electronically Signed By: Alm Parkins M.D. On: 02/07/2022 15:24  Lumbosacral Imaging: Lumbar DG Bending views: Results for orders placed during the hospital encounter of 11/07/23 DG Lumbar Spine Complete W/Bend  Narrative EXAM: 6 VIEW(S) XRAY OF THE LUMBAR SPINE 11/07/2023 02:04:37 PM  COMPARISON: None available.  CLINICAL HISTORY: Low back pain. Chronic pain being addressed at new pain management facility to establish care.  FINDINGS:  LUMBAR SPINE:  BONES: No acute fracture. No aggressive appearing osseous lesion. Alignment is normal.  DISCS AND DEGENERATIVE CHANGES: Mild degenerative changes of the visualized thoracolumbar spine, most prominent at L3-4.  SOFT TISSUES: Cholecystectomy clips. No acute abnormality.  IMPRESSION: 1. Mild degenerative changes of the visualized thoracolumbar spine, most prominent at L3-4.  Electronically signed by: Pinkie Pebbles MD 11/12/2023 12:33 PM EDT RP Workstation: [id number]  Sacroiliac Joint Imaging: Sacroiliac Joint DG: Results for orders placed during the hospital encounter of 11/07/23 DG Si Joints  Narrative CLINICAL DATA:  Right-sided hip pain  EXAM: BILATERAL SACROILIAC JOINTS - 3+ VIEW  COMPARISON:  CT 09/15/2023  FINDINGS: Minimal right SI joint degenerative change, but with patent joint spaces. Pubic symphysis and rami appear intact  IMPRESSION: Minimal right SI joint degenerative change.   Electronically Signed By: Luke Bun M.D. On: 11/12/2023 22:30  Hip Imaging: Hip-R DG 2-3 views: Results for orders placed during the hospital encounter of 11/07/23 DG HIP UNILAT W OR W/O PELVIS 2-3 VIEWS RIGHT  Narrative EXAM: 3 VIEW(S) XRAY OF THE RIGHT  HIP 11/07/2023 02:04:37 PM  COMPARISON: None available.  CLINICAL HISTORY: Right hip pain/arthralgia. Chronic pain being addressed at new pain management facility to establish care.  FINDINGS:  BONES AND JOINTS: No acute fracture or focal osseous lesion. The hip joint is maintained. No significant degenerative changes.  SOFT TISSUES: The soft tissues are unremarkable.  IMPRESSION: 1. No significant abnormality in the right hip or visualized pelvis.  Electronically signed by: Pinkie Pebbles MD 11/12/2023 12:51 PM EDT RP Workstation: [id number]  Hip-L DG 2-3 views: Results for orders placed during the hospital encounter of 11/19/21 DG Hip Unilat With Pelvis 2-3 Views Left  Narrative CLINICAL DATA:  Hip pain.  EXAM: DG HIP (WITH OR WITHOUT PELVIS) 2-3V LEFT  COMPARISON:  CT abdomen pelvis 12/13/2018.  FINDINGS: There is no evidence of hip fracture or dislocation. There is no evidence of arthropathy or other focal bone abnormality.  IMPRESSION: Negative.   Electronically Signed By: Leita Mattocks M.D. On: 11/19/2021 12:05  Knee Imaging: Knee-L MR wo contrast: Results for orders placed during the hospital encounter of 05/30/22 MR KNEE LEFT WO CONTRAST  Narrative CLINICAL DATA:  Medial left knee pain for the past 6-7 months. No injury or prior surgery.  EXAM: MRI OF THE LEFT KNEE WITHOUT CONTRAST  TECHNIQUE: Multiplanar, multisequence MR imaging of the knee was performed. No intravenous contrast was administered.  COMPARISON:  Left knee x-rays dated April 13, 2022.  FINDINGS: MENISCI  Medial meniscus:  Intact.  Lateral meniscus:  Intact.  LIGAMENTS  Cruciates:  Intact ACL and PCL.  Collaterals: Medial collateral ligament is intact. Lateral collateral ligament complex is intact.  CARTILAGE  Patellofemoral: High-grade partial-thickness cartilage loss over the patellar apex and superior trochlear groove.  Medial: Mild partial-thickness  cartilage loss over the central weight-bearing medial femoral condyle. Cartilage fissuring and irregularity over the posterior nonweightbearing medial femoral condyle with prominent subchondral marrow edema.  Lateral: Scattered cartilage thinning over the lateral femoral condyle with fell focal defect.  Joint:  Trace to small joint effusion.  Normal Hoffa's fat.  Popliteal Fossa:  No Baker cyst. Intact popliteus tendon.  Extensor Mechanism: Intact quadriceps tendon and patellar tendon. Intact medial and lateral patellar retinaculum. Intact MPFL.  Bones: No acute fracture or dislocation. No suspicious bone lesion.  Other: None.  IMPRESSION: 1. No meniscal or ligamentous injury. 2. Mild tricompartmental osteoarthritis.   Electronically Signed By: Elsie ONEIDA Shoulder M.D. On: 05/31/2022 14:09  Knee-L DG 4 views: Results for orders placed during the hospital encounter of 04/13/22 DG Knee Complete 4 Views Left  Narrative CLINICAL DATA:  Acute left knee pain.  EXAM: LEFT KNEE - COMPLETE 4+ VIEW  COMPARISON:  None Available.  FINDINGS: No evidence of fracture or dislocation. Probable small suprapatellar joint effusion is noted. No evidence of arthropathy or other focal bone abnormality. Soft tissues are unremarkable.  IMPRESSION: Probable small suprapatellar joint effusion. No fracture or dislocation is noted.   Electronically Signed By: Lynwood Landy Raddle M.D. On: 04/13/2022 08:59  Foot Imaging: Foot-R DG Complete: Results for orders placed during the hospital encounter of 04/22/21 DG Foot Complete Right  Narrative CLINICAL DATA:  Status post injury of right foot with pain since last night.  EXAM: RIGHT FOOT COMPLETE - 3+ VIEW  COMPARISON:  None.  FINDINGS: There is no evidence of fracture or dislocation. Small plantar calcaneal spur is noted. Soft tissues are unremarkable.  IMPRESSION: No acute fracture or dislocation.   Electronically Signed By:  Craig Farr M.D. On: 04/22/2021 12:05  Elbow Imaging: Elbow-L DG Complete: Results for orders placed during the hospital encounter of 02/06/22 DG Elbow Complete Left  Narrative CLINICAL DATA:  Mid and low back pain. Left knee pain. Symptoms for several months.  EXAM: LEFT ELBOW - COMPLETE 3+ VIEW  COMPARISON:  None Available.  FINDINGS: There is no evidence of fracture, dislocation, or joint effusion. There is no evidence of arthropathy or other focal bone abnormality. Soft tissues are unremarkable.  IMPRESSION: Negative.   Electronically Signed By: Alm Parkins M.D. On: 02/07/2022 15:26  Wrist Imaging: Wrist-L DG Complete: Results for orders placed during the hospital encounter of 01/10/16 DG Wrist Complete Left  Narrative CLINICAL DATA:  Rolinda of car fell on wrist earlier today.  Pain.  EXAM: LEFT WRIST - COMPLETE 3+ VIEW  COMPARISON:  None.  FINDINGS: There is no evidence of fracture or dislocation. There is no evidence of arthropathy or other focal bone abnormality. Soft tissues are unremarkable.  IMPRESSION: Negative.   Electronically Signed By: Franky Crease M.D. On: 01/10/2016 16:45  Complexity Note: Imaging results reviewed.                         Meds   Current Outpatient Medications:    Accu-Chek Softclix Lancets lancets, Use 1 lancet to check glucose once daily and as needed for diabetes, Disp: 100 each, Rfl: 12   acetaminophen  (TYLENOL ) 500 MG tablet, Take 2 tablets (1,000 mg total) by mouth 4 (four) times daily., Disp: 30 tablet, Rfl: 0   albuterol  (VENTOLIN  HFA) 108 (90 Base) MCG/ACT inhaler, INHALE 2 PUFFS INTO THE LUNGS EVERY  6 HOURS AS MEEDED FOR WHEEZING OR SHORTNESS OF BREATH, Disp: 18 g, Rfl: 1   aspirin  EC 81 MG tablet, Take 81 mg by mouth daily at 3 pm. , Disp: , Rfl:    atorvastatin  (LIPITOR) 40 MG tablet, Take 1 tablet (40 mg total) by mouth daily., Disp: 90 tablet, Rfl: 3   Blood Glucose Monitoring Suppl (ACCU-CHEK GUIDE ME)  w/Device KIT, Use as directed. E11.65, Disp: 1 kit, Rfl: 0   BREZTRI  AEROSPHERE 160-9-4.8 MCG/ACT AERO inhaler, INHALE 2 PUFFS TWICE DAILY, Disp: 11 g, Rfl: 0   buPROPion  (WELLBUTRIN  XL) 150 MG 24 hr tablet, Take 1 tablet (150 mg total) by mouth daily. Take along with 300 mg daily, Disp: 90 tablet, Rfl: 3   buPROPion  (WELLBUTRIN  XL) 300 MG 24 hr tablet, Take 1 tablet (300 mg total) by mouth daily. Take along with 150 mg daily, Disp: 90 tablet, Rfl: 3   Continuous Glucose Sensor (DEXCOM G7 SENSOR) MISC, Use one every 10 days for uncontrolled diabetes E11.65, Disp: 3 each, Rfl: 11   cyclobenzaprine  (FLEXERIL ) 10 MG tablet, Take 0.5-1 tablets (5-10 mg total) by mouth 3 (three) times daily as needed for muscle spasms., Disp: 60 tablet, Rfl: 2   dapagliflozin  propanediol (FARXIGA ) 10 MG TABS tablet, Take 1 tablet (10 mg total) by mouth daily before breakfast., Disp: 90 tablet, Rfl: 2   DULoxetine  (CYMBALTA ) 30 MG capsule, Take 1 capsule (30 mg total) by mouth daily. Take along with 60 mg daily , total of 90 mg daily, Disp: 90 capsule, Rfl: 3   DULoxetine  (CYMBALTA ) 60 MG capsule, Take 1 capsule (60 mg total) by mouth daily. Take along with 30 mg daily, Disp: 90 capsule, Rfl: 3   ezetimibe  (ZETIA ) 10 MG tablet, Take 1 tablet by mouth once daily, Disp: 90 tablet, Rfl: 0   famotidine  (PEPCID ) 20 MG tablet, Take 1 tablet by mouth twice daily, Disp: 180 tablet, Rfl: 0   glucose blood (ACCU-CHEK GUIDE) test strip, Use 1 test strip to check fasting glucose once daily and as need for diabetes, Disp: 100 each, Rfl: 12   insulin  glargine, 2 Unit Dial , (TOUJEO  MAX SOLOSTAR) 300 UNIT/ML Solostar Pen, Inject 50 Units into the skin daily., Disp: 15 mL, Rfl: 3   insulin  lispro (HUMALOG  KWIKPEN) 200 UNIT/ML KwikPen, INJECT INSULIN  INTO THE SKIN WITH MEALS THREE TIMES DAILY PER SLIDING SCALE PROVIDED. MAX DAILY DOSE IS 42 UNITS, Disp: 6 mL, Rfl: 3   Insulin  Pen Needle 32G X 4 MM MISC, Use with daily dose insulin  and with  sliding scale insulin  as needed., Disp: 100 each, Rfl: 5   Melatonin 10 MG CAPS, Take 30 mg by mouth at bedtime., Disp: , Rfl:    meloxicam  (MOBIC ) 15 MG tablet, Take 15 mg by mouth daily., Disp: , Rfl:    metoprolol  tartrate (LOPRESSOR ) 100 MG tablet, Take 1 tablet by mouth twice daily, Disp: 180 tablet, Rfl: 0   nitroGLYCERIN  (NITROSTAT ) 0.4 MG SL tablet, Place 1 tablet (0.4 mg total) under the tongue every 5 (five) minutes x 3 doses as needed for chest pain., Disp: 30 tablet, Rfl: 1   oxyCODONE  (OXY IR/ROXICODONE ) 5 MG immediate release tablet, Take 1 tablet (5 mg total) by mouth 2 (two) times daily as needed for severe pain (pain score 7-10)., Disp: 40 tablet, Rfl: 0   OZEMPIC , 1 MG/DOSE, 4 MG/3ML SOPN, INJECT 1MG   SUBCUTANEOUSLY ONCE A WEEK, Disp: 9 mL, Rfl: 0   pantoprazole  (PROTONIX ) 40 MG tablet, Take 1 tablet  by mouth once daily, Disp: 90 tablet, Rfl: 0   pregabalin  (LYRICA ) 50 MG capsule, Take 1 capsule (50 mg total) by mouth 3 (three) times daily. For 7 days then increase to 2 capsules PO 3 times daily, Disp: 159 capsule, Rfl: 0   rOPINIRole  (REQUIP ) 0.25 MG tablet, Take 1 tablet (0.25 mg total) by mouth 3 (three) times daily., Disp: 90 tablet, Rfl: 1   tamsulosin  (FLOMAX ) 0.4 MG CAPS capsule, Take 1 capsule by mouth once daily, Disp: 90 capsule, Rfl: 0   temazepam  (RESTORIL ) 30 MG capsule, Take 1 capsule (30 mg total) by mouth at bedtime as needed for sleep., Disp: 30 capsule, Rfl: 5   varenicline  (CHANTIX ) 1 MG tablet, Take 1 tablet by mouth twice daily, Disp: 60 tablet, Rfl: 3  ROS  Constitutional: Denies any fever or chills Gastrointestinal: No reported hemesis, hematochezia, vomiting, or acute GI distress Musculoskeletal: Denies any acute onset joint swelling, redness, loss of ROM, or weakness Neurological: No reported episodes of acute onset apraxia, aphasia, dysarthria, agnosia, amnesia, paralysis, loss of coordination, or loss of consciousness  Allergies  Mr. Plascencia is  allergic to onion, norco [hydrocodone-acetaminophen ], nickel, and nicoderm [nicotine ].  PFSH  Drug: Mr. Langille  reports no history of drug use. Alcohol:  reports no history of alcohol use. Tobacco:  reports that he has been smoking cigarettes. He has a 10 pack-year smoking history. He has quit using smokeless tobacco.  His smokeless tobacco use included chew. 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 Surgical: Mr. Bob  has a past surgical history that includes Shoulder surgery (Right); Clavicle surgery (Right);  ingrown toenail removal; Colonoscopy (2017); Upper gi endoscopy (2017); Mouth surgery; Cholecystectomy (N/A, 01/23/2019); Esophagogastroduodenoscopy (egd) with propofol  (N/A, 08/25/2020); Colonoscopy with propofol  (N/A, 08/25/2020); Xi robotic assisted thoracoscopy- segmentectomy (Right, 10/27/2022); Lymph node dissection (Right, 10/27/2022); IR THORACENTESIS ASP PLEURAL SPACE W/IMG GUIDE (11/20/2022); and Lung lobectomy (Right, 2024). Family: family history includes Anxiety disorder in his brother, sister, sister, sister, and sister; COPD in his father; Colon cancer in his maternal uncle; Depression in his brother, sister, sister, sister, and sister; Diabetes in his brother, maternal aunt, mother, sister, sister, sister, and sister; Diabetes type II in his mother; Heart disease in his father; Hypertension in his mother; Lung cancer in his father.  Constitutional Exam  General appearance: Well  nourished, well developed, and well hydrated. In no apparent acute distress There were no vitals filed for this visit. BMI Assessment: Estimated body mass index is 31.17 kg/m as calculated from the following:   Height as of 11/07/23: 5' 8 (1.727 m).   Weight as of 11/15/23: 205 lb (93 kg).  BMI interpretation table: BMI level Category Range association with higher incidence of chronic pain  <18 kg/m2 Underweight   18.5-24.9 kg/m2 Ideal body weight   25-29.9 kg/m2 Overweight Increased incidence by 20%  30-34.9 kg/m2 Obese (Class I) Increased incidence by 68%  35-39.9 kg/m2 Severe obesity (Class II) Increased incidence by 136%  >40 kg/m2 Extreme obesity (Class III) Increased incidence by 254%   Patient's current BMI Ideal Body weight  There is no height or weight on file to calculate BMI. Ideal  body weight: 68.4 kg (150 lb 12.7 oz) Adjusted ideal body weight: 78.2 kg (172 lb 7.6 oz)   BMI Readings from Last 4 Encounters:  11/15/23 31.17 kg/m  11/07/23 31.47 kg/m  10/29/23 30.45 kg/m  10/24/23 30.24 kg/m   Wt Readings from Last 4 Encounters:  11/15/23 205 lb (93 kg)  11/07/23 207 lb (93.9 kg)  10/29/23 206 lb 3.2 oz (93.5 kg)  10/24/23 204 lb 12.8 oz (92.9 kg)    Psych/Mental status: Alert, oriented x 3 (person, place, & time)       Eyes: PERLA Respiratory: No evidence of acute respiratory distress  Assessment & Plan  Primary Diagnosis & Pertinent Problem List: There were no encounter diagnoses. Visit Diagnosis: No diagnosis found. Problems updated and reviewed during this visit: No problems updated.  Plan of Care  Assessment and Plan             Pharmacotherapy (Medications Ordered): No orders of the defined types were placed in this encounter.  Procedure Orders    No procedure(s) ordered today   No orders of the defined types were placed in this encounter.  Lab Orders  No laboratory test(s) ordered today   Imaging Orders  No imaging studies ordered  today   Referral Orders  No referral(s) requested today    Pharmacological management:  Opioid Analgesics: I will not be prescribing any opioids at this time Membrane stabilizer: I will not be prescribing any at this time Muscle relaxant: I will not be prescribing any at this time NSAID: I will not be prescribing any at this time Other analgesic(s): I will not be prescribing any at this time      Interventional Therapies  Risk Factors  Considerations  Medical Comorbidities:     Planned  Pending:      Under consideration:   Pending   Completed: (Analgesic benefit)1  None at this time   Therapeutic  Palliative (PRN) options:   None established   Completed by other providers:   None reported  1(Analgesic benefit): Expressed in percentage (%). (Local anesthetic[LA] +/- sedation  L.A.Local Anesthetic  Steroid benefit  Ongoing benefit)      Provider-requested follow-up: No follow-ups on file. Recent Visits Date Type Provider Dept  11/07/23 Office Visit Tanya Glisson, MD [facility]  Showing recent visits within past 90 days and meeting all other requirements Future Appointments Date Type Provider Dept  11/28/23 Appointment Tanya Glisson, MD [facility]  Showing future appointments within next 90 days and meeting all other requirements   Primary Care Physician: Liana Fish, NP  Duration of encounter: *** minutes.  Total time on encounter, as per AMA guidelines included both the face-to-face and non-face-to-face time personally spent by the physician and/or other qualified health care professional(s) on the day of the encounter (includes time in activities that require the physician or other qualified health care professional and does not include time in activities normally performed by clinical staff). Physician's time may include the following activities when performed: Preparing to see the patient (e.g., pre-charting review of  records, searching for previously ordered imaging, lab work, and nerve conduction tests) Review of prior analgesic pharmacotherapies. Reviewing PMP Interpreting ordered tests (e.g., lab work, imaging, nerve conduction tests) Performing post-procedure evaluations, including interpretation of diagnostic procedures Obtaining and/or reviewing separately obtained history Performing a medically appropriate examination and/or evaluation Counseling and educating the patient/family/caregiver Ordering medications, tests, or procedures Referring and communicating with other health care professionals (when not separately reported) Documenting  clinical information in the electronic or other health record Independently interpreting results (not separately reported) and communicating results to the patient/ family/caregiver Care coordination (not separately reported)  Note by: Eric DELENA Como, MD (TTS technology used. I apologize for any typographical errors that were not detected and corrected.) Date: 11/28/2023; Time: 11:00 AM

## 2023-11-28 ENCOUNTER — Encounter: Payer: Self-pay | Admitting: Pain Medicine

## 2023-11-28 ENCOUNTER — Ambulatory Visit: Attending: Pain Medicine | Admitting: Pain Medicine

## 2023-11-28 ENCOUNTER — Telehealth: Payer: Self-pay | Admitting: Nurse Practitioner

## 2023-11-28 VITALS — BP 131/77 | HR 78 | Temp 97.6°F | Ht 69.0 in | Wt 205.0 lb

## 2023-11-28 DIAGNOSIS — E538 Deficiency of other specified B group vitamins: Secondary | ICD-10-CM | POA: Diagnosis not present

## 2023-11-28 DIAGNOSIS — E559 Vitamin D deficiency, unspecified: Secondary | ICD-10-CM | POA: Insufficient documentation

## 2023-11-28 DIAGNOSIS — M533 Sacrococcygeal disorders, not elsewhere classified: Secondary | ICD-10-CM | POA: Insufficient documentation

## 2023-11-28 DIAGNOSIS — M545 Low back pain, unspecified: Secondary | ICD-10-CM | POA: Insufficient documentation

## 2023-11-28 DIAGNOSIS — M542 Cervicalgia: Secondary | ICD-10-CM | POA: Diagnosis not present

## 2023-11-28 DIAGNOSIS — M79604 Pain in right leg: Secondary | ICD-10-CM | POA: Insufficient documentation

## 2023-11-28 DIAGNOSIS — M4312 Spondylolisthesis, cervical region: Secondary | ICD-10-CM | POA: Insufficient documentation

## 2023-11-28 DIAGNOSIS — M4310 Spondylolisthesis, site unspecified: Secondary | ICD-10-CM | POA: Diagnosis not present

## 2023-11-28 DIAGNOSIS — G8929 Other chronic pain: Secondary | ICD-10-CM | POA: Insufficient documentation

## 2023-11-28 DIAGNOSIS — G4486 Cervicogenic headache: Secondary | ICD-10-CM | POA: Diagnosis not present

## 2023-11-28 DIAGNOSIS — M25551 Pain in right hip: Secondary | ICD-10-CM | POA: Insufficient documentation

## 2023-11-28 MED ORDER — VITAMIN D3 125 MCG (5000 UT) PO CAPS: 5000.0000 [IU] | ORAL_CAPSULE | Freq: Every day | ORAL | 0 refills | Status: AC

## 2023-11-28 MED ORDER — VITAMIN B-12 5000 MCG SL SUBL: 5000.0000 ug | SUBLINGUAL_TABLET | Freq: Every day | SUBLINGUAL | 0 refills | Status: AC

## 2023-11-28 MED ORDER — ERGOCALCIFEROL 1.25 MG (50000 UT) PO CAPS: 50000.0000 [IU] | ORAL_CAPSULE | ORAL | 0 refills | Status: AC

## 2023-11-28 NOTE — Telephone Encounter (Signed)
 Gastroenterology appointment 05/29/2024 @ [facility]

## 2023-11-28 NOTE — Patient Instructions (Addendum)
 ______________________________________________________________________    Procedure instructions  Stop blood-thinners  Do not eat or drink fluids (other than water) for 6 hours before your procedure  No water for 2 hours before your procedure  Take your blood pressure medicine with a sip of water  Arrive 30 minutes before your appointment  If sedation is planned, bring suitable driver. [place], [place], & public transportation are NOT APPROVED)  Carefully read the Preparing for your procedure detailed instructions  If you have questions call us  at [phone]  Procedure appointments are for procedures only.   NO medication refills or new problem evaluations will be done on procedure days.   Only the scheduled, pre-approved procedure and side will be done.   ______________________________________________________________________      ______________________________________________________________________    Preparing for your procedure  Appointments: If you think you may not be able to keep your appointment, call 24-48 hours in advance to cancel. We need time to make it available to others.  Procedure visits are for procedures only. During your procedure appointment there will be: NO Prescription Refills*. NO medication changes or discussions*. NO discussion of disability issues*. NO unrelated pain problem evaluations*. NO evaluations to order other pain procedures*. *These will be addressed at a separate and distinct evaluation encounter on the provider's evaluation schedule and not during procedure days.  Instructions: Food intake: Avoid eating anything solid for at least 8 hours prior to your procedure. Clear liquid intake: You may take clear liquids such as water up to 2 hours prior to your procedure. (No carbonated drinks. No soda.) Transportation: Unless otherwise stated by your physician, bring a driver. (Driver cannot be a Market researcher, Pharmacist, community, or any other form of public  transportation.) Morning Medicines: Except for blood thinners, take all of your other morning medications with a sip of water. Make sure to take your heart and blood pressure medicines. If your blood pressure's lower number is above 100, the case will be rescheduled. Blood thinners: Make sure to stop your blood thinners as instructed.  If you take a blood thinner, but were not instructed to stop it, call our office [phone] and ask to talk to a nurse. Not stopping a blood thinner prior to certain procedures could lead to serious complications. Diabetics on insulin: Notify the staff so that you can be scheduled 1st case in the morning. If your diabetes requires high dose insulin, take only  of your normal insulin dose the morning of the procedure and notify the staff that you have done so. Preventing infections: Shower with an antibacterial soap the morning of your procedure.  Build-up your immune system: Take 1000 mg of Vitamin C with every meal (3 times a day) the day prior to your procedure. Antibiotics: Inform the nursing staff if you are taking any antibiotics or if you have any conditions that may require antibiotics prior to procedures. (Example: recent joint implants)   Pregnancy: If you are pregnant make sure to notify the nursing staff. Not doing so may result in injury to the fetus, including death.  Sickness: If you have a cold, fever, or any active infections, call and cancel or reschedule your procedure. Receiving steroids while having an infection may result in complications. Arrival: You must be in the facility at least 30 minutes prior to your scheduled procedure. Tardiness: Your scheduled time is also the cutoff time. If you do not arrive at least 15 minutes prior to your procedure, you will be rescheduled.  Children: Do not bring any children  with you. Make arrangements to keep them home. Dress appropriately: There is always a possibility that your clothing may get soiled. Avoid  long dresses. Valuables: Do not bring any jewelry or valuables.  Reasons to call and reschedule or cancel your procedure: (Following these recommendations will minimize the risk of a serious complication.) Surgeries: Avoid having procedures within 2 weeks of any surgery. (Avoid for 2 weeks before or after any surgery). Flu Shots: Avoid having procedures within 2 weeks of a flu shots or . (Avoid for 2 weeks before or after immunizations). Barium: Avoid having a procedure within 7-10 days after having had a radiological study involving the use of radiological contrast. (Myelograms, Barium swallow or enema study). Heart attacks: Avoid any elective procedures or surgeries for the initial 6 months after a Myocardial Infarction (Heart Attack). Blood thinners: It is imperative that you stop these medications before procedures. Let us  know if you if you take any blood thinner.  Infection: Avoid procedures during or within two weeks of an infection (including chest colds or gastrointestinal problems). Symptoms associated with infections include: Localized redness, fever, chills, night sweats or profuse sweating, burning sensation when voiding, cough, congestion, stuffiness, runny nose, sore throat, diarrhea, nausea, vomiting, cold or Flu symptoms, recent or current infections. It is specially important if the infection is over the area that we intend to treat. Heart and lung problems: Symptoms that may suggest an active cardiopulmonary problem include: cough, chest pain, breathing difficulties or shortness of breath, dizziness, ankle swelling, uncontrolled high or unusually low blood pressure, and/or palpitations. If you are experiencing any of these symptoms, cancel your procedure and contact your primary care physician for an evaluation.  Remember:  Regular Business hours are:  Monday to Thursday 8:00 AM to 4:00 PM  Provider's Schedule: Eric Como, MD:  Procedure days: Tuesday and Thursday 7:30  AM to 4:00 PM  [name] Sherry, MD:  Procedure days: Monday and Wednesday 7:30 AM to 4:00 PM Last  Updated: 04/03/2023 ______________________________________________________________________      ______________________________________________________________________    General Risks and Possible Complications  Patient Responsibilities: It is important that you read this as it is part of your informed consent. It is our duty to inform you of the risks and possible complications associated with treatments offered to you. It is your responsibility as a patient to read this and to ask questions about anything that is not clear or that you believe was not covered in this document.  Patient's Rights: You have the right to refuse treatment. You also have the right to change your mind, even after initially having agreed to have the treatment done. However, under this last option, if you wait until the last second to change your mind, you may be charged for the materials used up to that point.  Introduction: Medicine is not an Visual merchandiser. Everything in Medicine, including the lack of treatment(s), carries the potential for danger, harm, or loss (which is by definition: Risk). In Medicine, a complication is a secondary problem, condition, or disease that can aggravate an already existing one. All treatments carry the risk of possible complications. The fact that a side effects or complications occurs, does not imply that the treatment was conducted incorrectly. It must be clearly understood that these can happen even when everything is done following the highest safety standards.  No treatment: You can choose not to proceed with the proposed treatment alternative. The "PRO(s)" would include: avoiding the risk of complications associated with the therapy. The "CON(s)"  would include: not getting any of the treatment benefits. These benefits fall under one of three categories: diagnostic; therapeutic; and/or  palliative. Diagnostic benefits include: getting information which can ultimately lead to improvement of the disease or symptom(s). Therapeutic benefits are those associated with the successful treatment of the disease. Finally, palliative benefits are those related to the decrease of the primary symptoms, without necessarily curing the condition (example: decreasing the pain from a flare-up of a chronic condition, such as incurable terminal cancer).  General Risks and Complications: These are associated to most interventional treatments. They can occur alone, or in combination. They fall under one of the following six (6) categories: no benefit or worsening of symptoms; bleeding; infection; nerve damage; allergic reactions; and/or death. No benefits or worsening of symptoms: In Medicine there are no guarantees, only probabilities. No healthcare provider can ever guarantee that a medical treatment will work, they can only state the probability that it may. Furthermore, there is always the possibility that the condition may worsen, either directly, or indirectly, as a consequence of the treatment. Bleeding: This is more common if the patient is taking a blood thinner, either prescription or over the counter (example: Goody Powders, Fish oil, Aspirin, Garlic, etc.), or if suffering a condition associated with impaired coagulation (example: Hemophilia, cirrhosis of the liver, low platelet counts, etc.). However, even if you do not have one on these, it can still happen. If you have any of these conditions, or take one of these drugs, make sure to notify your treating physician. Infection: This is more common in patients with a compromised immune system, either due to disease (example: diabetes, cancer, human immunodeficiency virus [HIV], etc.), or due to medications or treatments (example: therapies used to treat cancer and rheumatological diseases). However, even if you do not have one on these, it can still  happen. If you have any of these conditions, or take one of these drugs, make sure to notify your treating physician. Nerve Damage: This is more common when the treatment is an invasive one, but it can also happen with the use of medications, such as those used in the treatment of cancer. The damage can occur to small secondary nerves, or to large primary ones, such as those in the spinal cord and brain. This damage may be temporary or permanent and it may lead to impairments that can range from temporary numbness to permanent paralysis and/or brain death. Allergic Reactions: Any time a substance or material comes in contact with our body, there is the possibility of an allergic reaction. These can range from a mild skin rash (contact dermatitis) to a severe systemic reaction (anaphylactic reaction), which can result in death. Death: In general, any medical intervention can result in death, most of the time due to an unforeseen complication. ______________________________________________________________________      ____________________________________________________________________________________________  Spondylolisthesis  Spondylolisthesis is a condition that occurs when a vertebra in the spine slips out of place, usually in the lower back. Symptoms can vary from mild to severe, and a person may have no symptoms.  Some common symptoms include:  Back pain, especially chronic pain  Pain that radiates down the legs  Pain that worsens with exercise  Tightness in the hamstrings  Neck stiffness  Loss of spine flexibility  Weakness in the legs or trouble walking  Numbness and tingling in the groin and/or buttocks   Some causes of spondylolisthesis include: Birth defects. Sudden injury. Abnormal wear on the cartilage and bones, such as arthritis. Bone  disease and fractures. Certain sports activities, such as gymnastics, weightlifting, and football.  A doctor can diagnose spondylolisthesis  with a physical exam, X-rays, and possibly a CT scan.    Forward slippage is known as "Anterolisthesis".  Backward slippage is known as "Retrolisthesis".   Pathophysiology of Spondylolisthesis:   Grading Classification of Spondylolisthesis Grade I spondylolisthesis is 1 to 25% slippage, grade II is up to 50% slippage, grade III is up to 75% slippage, and grade IV is 76-100% slippage. If there is more than 100% slippage, it is known as spondyloptosis or grade V spondylolisthesis.       ____________________________________________________________________________________________

## 2023-11-28 NOTE — Progress Notes (Signed)
 Safety precautions to be maintained throughout the outpatient stay will include: orient to surroundings, keep bed in low position, maintain call bell within reach at all times, provide assistance with transfer out of bed and ambulation.

## 2023-12-10 ENCOUNTER — Ambulatory Visit: Admitting: Internal Medicine

## 2023-12-15 ENCOUNTER — Other Ambulatory Visit: Payer: Self-pay | Admitting: Nurse Practitioner

## 2023-12-15 DIAGNOSIS — E1169 Type 2 diabetes mellitus with other specified complication: Secondary | ICD-10-CM

## 2023-12-17 ENCOUNTER — Ambulatory Visit (INDEPENDENT_AMBULATORY_CARE_PROVIDER_SITE_OTHER): Admitting: Internal Medicine

## 2023-12-17 ENCOUNTER — Encounter: Payer: Self-pay | Admitting: Internal Medicine

## 2023-12-17 VITALS — BP 131/75 | HR 86 | Temp 98.4°F | Resp 16 | Ht 69.0 in | Wt 209.0 lb

## 2023-12-17 DIAGNOSIS — F17210 Nicotine dependence, cigarettes, uncomplicated: Secondary | ICD-10-CM | POA: Diagnosis not present

## 2023-12-17 DIAGNOSIS — J439 Emphysema, unspecified: Secondary | ICD-10-CM

## 2023-12-17 DIAGNOSIS — R0602 Shortness of breath: Secondary | ICD-10-CM

## 2023-12-17 NOTE — Progress Notes (Signed)
 [facility] [address] [place], [place] [address]  Pulmonary Sleep Medicine   Office Visit Note  Patient Name: Raymond [name] DOB: [birth year] MRN [id number]  Date of Service: 12/17/2023  Complaints/HPI: He has had a lexiscan  and this shows that he has possibly an old scar. He was read as a low risk study. He states he is still having some issues with his breathing. He feels more short of breath bending over. He also feels that his left are has some numbness  Office Spirometry Results:     ROS  General: (-) fever, (-) chills, (-) night sweats, (-) weakness Skin: (-) rashes, (-) itching,. Eyes: (-) visual changes, (-) redness, (-) itching. Nose and Sinuses: (-) nasal stuffiness or itchiness, (-) postnasal drip, (-) nosebleeds, (-) sinus trouble. Mouth and Throat: (-) sore throat, (-) hoarseness. Neck: (-) swollen glands, (-) enlarged thyroid , (-) neck pain. Respiratory: - cough, (-) bloody sputum, + shortness of breath, - wheezing. Cardiovascular: - ankle swelling, (-) chest pain. Lymphatic: (-) lymph node enlargement. Neurologic: (-) numbness, (-) tingling. Psychiatric: (-) anxiety, (-) depression   Current Medication: Outpatient Encounter Medications as of 12/17/2023  Medication Sig   Accu-Chek Softclix Lancets lancets Use 1 lancet to check glucose once daily and as needed for diabetes   acetaminophen  (TYLENOL ) 500 MG tablet Take 2 tablets (1,000 mg total) by mouth 4 (four) times daily.   albuterol  (VENTOLIN  HFA) 108 (90 Base) MCG/ACT inhaler INHALE 2 PUFFS INTO THE LUNGS EVERY 6 HOURS AS MEEDED FOR WHEEZING OR SHORTNESS OF BREATH   aspirin  EC 81 MG tablet Take 81 mg by mouth daily at 3 pm.    atorvastatin  (LIPITOR) 40 MG tablet Take 1 tablet (40 mg total) by mouth daily.   Blood Glucose Monitoring Suppl (ACCU-CHEK GUIDE ME) w/Device KIT Use as directed. E11.65   BREZTRI  AEROSPHERE 160-9-4.8 MCG/ACT AERO inhaler INHALE 2 PUFFS TWICE DAILY   buPROPion   (WELLBUTRIN  XL) 150 MG 24 hr tablet Take 1 tablet (150 mg total) by mouth daily. Take along with 300 mg daily   buPROPion  (WELLBUTRIN  XL) 300 MG 24 hr tablet Take 1 tablet (300 mg total) by mouth daily. Take along with 150 mg daily   Cholecalciferol (VITAMIN D3) 125 MCG (5000 UT) CAPS Take 1 capsule (5,000 Units total) by mouth daily with breakfast. Take along with calcium  and magnesium.   Continuous Glucose Sensor (DEXCOM G7 SENSOR) MISC Use one every 10 days for uncontrolled diabetes E11.65   Cyanocobalamin (VITAMIN B-12) 5000 MCG SUBL Place 1 tablet (5,000 mcg total) under the tongue daily.   cyclobenzaprine  (FLEXERIL ) 10 MG tablet Take 0.5-1 tablets (5-10 mg total) by mouth 3 (three) times daily as needed for muscle spasms.   dapagliflozin  propanediol (FARXIGA ) 10 MG TABS tablet Take 1 tablet (10 mg total) by mouth daily before breakfast.   DULoxetine  (CYMBALTA ) 30 MG capsule Take 1 capsule (30 mg total) by mouth daily. Take along with 60 mg daily , total of 90 mg daily   DULoxetine  (CYMBALTA ) 60 MG capsule Take 1 capsule (60 mg total) by mouth daily. Take along with 30 mg daily   ergocalciferol  (VITAMIN D2) 1.25 MG (50000 UT) capsule Take 1 capsule (50,000 Units total) by mouth 2 (two) times a week. X 6 weeks.   ezetimibe  (ZETIA ) 10 MG tablet Take 1 tablet by mouth once daily   famotidine  (PEPCID ) 20 MG tablet Take 1 tablet by mouth twice daily   glucose blood (ACCU-CHEK GUIDE) test strip Use 1  test strip to check fasting glucose once daily and as need for diabetes   insulin  glargine, 2 Unit Dial , (TOUJEO  MAX SOLOSTAR) 300 UNIT/ML Solostar Pen Inject 50 Units into the skin daily.   insulin  lispro (HUMALOG  KWIKPEN) 200 UNIT/ML KwikPen INJECT INSULIN  INTO THE SKIN WITH MEALS THREE TIMES DAILY PER SLIDING SCALE PROVIDED. MAX DAILY DOSE IS 42 UNITS   Insulin  Pen Needle 32G X 4 MM MISC Use with daily dose insulin  and with sliding scale insulin  as needed.   Melatonin 10 MG CAPS Take 30 mg by mouth at  bedtime.   meloxicam  (MOBIC ) 15 MG tablet Take 15 mg by mouth daily.   metoprolol  tartrate (LOPRESSOR ) 100 MG tablet Take 1 tablet by mouth twice daily   nitroGLYCERIN  (NITROSTAT ) 0.4 MG SL tablet Place 1 tablet (0.4 mg total) under the tongue every 5 (five) minutes x 3 doses as needed for chest pain.   oxyCODONE  (OXY IR/ROXICODONE ) 5 MG immediate release tablet Take 1 tablet (5 mg total) by mouth 2 (two) times daily as needed for severe pain (pain score 7-10).   pantoprazole  (PROTONIX ) 40 MG tablet Take 1 tablet by mouth once daily   pregabalin  (LYRICA ) 50 MG capsule Take 1 capsule (50 mg total) by mouth 3 (three) times daily. For 7 days then increase to 2 capsules PO 3 times daily   rOPINIRole  (REQUIP ) 0.25 MG tablet Take 1 tablet (0.25 mg total) by mouth 3 (three) times daily.   Semaglutide , 1 MG/DOSE, (OZEMPIC , 1 MG/DOSE,) 4 MG/3ML SOPN INJECT 1MG  SUBCUTANEOUSLY ONCE WEEKLY   tamsulosin  (FLOMAX ) 0.4 MG CAPS capsule Take 1 capsule by mouth once daily   temazepam  (RESTORIL ) 30 MG capsule Take 1 capsule (30 mg total) by mouth at bedtime as needed for sleep.   varenicline  (CHANTIX ) 1 MG tablet Take 1 tablet by mouth twice daily   No facility-administered encounter medications on file as of 12/17/2023.    Surgical History: Past Surgical History:  Procedure Laterality Date    ingrown toenail removal     CHOLECYSTECTOMY N/A 01/23/2019   Procedure: LAPAROSCOPIC CHOLECYSTECTOMY;  Surgeon: Desiderio Schanz, MD;  Location: [facility] ORS;  Service: General;  Laterality: N/A;   CLAVICLE SURGERY Right    COLONOSCOPY  2017   COLONOSCOPY WITH PROPOFOL  N/A 08/25/2020   Procedure: COLONOSCOPY WITH PROPOFOL ;  Surgeon: Janalyn [name] NOVAK, MD;  Location: [facility] END[facility];  Service: Endoscopy;  Laterality: N/A;   ESOPHAGOGASTRODUODENOSCOPY (EGD) WITH PROPOFOL  N/A 08/25/2020   Procedure: ESOPHAGOGASTRODUODENOSCOPY (EGD) WITH PROPOFOL ;  Surgeon: Janalyn [name] NOVAK, MD;  Location: [facility] END[facility]COPY;  Service: Endoscopy;   Laterality: N/A;   IR THORACENTESIS ASP PLEURAL SPACE W/IMG GUIDE  11/20/2022   LUNG LOBECTOMY Right 2024   LYMPH NODE DISSECTION Right 10/27/2022   Procedure: LYMPH NODE DISSECTION;  Surgeon: Kerrin Elspeth BROCKS, MD;  Location: [facility] OR;  Service: Thoracic;  Laterality: Right;   MOUTH SURGERY     REMOVED ALL TEETH   SHOULDER SURGERY Right    UPPER GI ENDOSCOPY  2017   XI ROBOTIC ASSISTED THORACOSCOPY- SEGMENTECTOMY Right 10/27/2022   Procedure: XI ROBOTIC ASSISTED THORACOSCOPY-RIGHT LOWER LOBE SUPERIOR SEGMENTECTOMY;  Surgeon: Kerrin Elspeth BROCKS, MD;  Location: [facility] OR;  Service: Thoracic;  Laterality: Right;    Medical History: Past Medical History:  Diagnosis Date   Anxiety    Arthritis    NECK   Bronchitis    Cancer (HCC)    R lung, had lobectomy   Coronary artery disease    Depression    Diabetes  mellitus without complication (HCC)    Diastasis of rectus abdominis 06/19/2018   Dyspnea    DUE TO GALLBLADDER PER PT   Dysrhythmia    fast hear rate at times. stopped when placed on metoprolol    Emphysema lung (HCC)    Family history of adverse reaction to anesthesia    N/V, TROUBLE BREATHING COMING OUT OF ANESTHESIA   Fatty liver    GERD (gastroesophageal reflux disease)    Headache    MIGRAINES   Hyperlipidemia    Hypertension    Irregular heart beat unk   Myocardial infarction (HCC) 2010   MILD    PTSD (post-traumatic stress disorder)    Sleep apnea    USES CPAP   Tachycardia    Ventral hernia     Family History: Family History  Problem Relation Age of Onset   Hypertension Mother    Diabetes type II Mother    Diabetes Mother    COPD Father    Lung cancer Father    Heart disease Father    Diabetes Sister    Anxiety disorder Sister    Depression Sister    Diabetes Sister    Anxiety disorder Sister    Depression Sister    Diabetes Sister    Anxiety disorder Sister    Depression Sister    Diabetes Sister    Anxiety disorder Sister    Depression  Sister    Diabetes Brother    Anxiety disorder Brother    Depression Brother    Diabetes Maternal Aunt    Colon cancer Maternal Uncle     Social History: Social History   Socioeconomic History   Marital status: Divorced    Spouse name: Not on file   Number of children: 4   Years of education: Not on file   Highest education level: 8th grade  Occupational History   Not on file  Tobacco Use   Smoking status: Every Day    Current packs/day: 0.25    Average packs/day: 0.3 packs/day for 40.0 years (10.0 ttl pk-yrs)    Types: Cigarettes   Smokeless tobacco: Former    Types: Chew   Tobacco comments:    Reports smoking 1/2 pack a day and trying to cut back   Vaping Use   Vaping status: Former  Substance and Sexual Activity   Alcohol use: No    Alcohol/week: 0.0 standard drinks of alcohol   Drug use: No   Sexual activity: Not Currently  Other Topics Concern   Not on file  Social History Narrative   Not on file   Social Drivers of Health   Financial Resource Strain: Low Risk  (08/17/2020)   Overall Financial Resource Strain (CARDIA)    Difficulty of Paying Living Expenses: Not very hard  Food Insecurity: Food Insecurity Present (12/14/2022)   Hunger Vital Sign    Worried About Running Out of Food in the Last Year: Often true    Ran Out of Food in the Last Year: Often true  Transportation Needs: No Transportation Needs (12/14/2022)   PRAPARE - Administrator, Civil Service (Medical): No    Lack of Transportation (Non-Medical): No  Physical Activity: Inactive (05/23/2018)   Exercise Vital Sign    Days of Exercise per Week: 0 days    Minutes of Exercise per Session: 0 min  Stress: Stress Concern Present (05/23/2018)   [employer] of Occupational Health - Occupational Stress Questionnaire    Feeling of Stress : Very  much  Social Connections: Unknown (05/23/2018)   Social Connection and Isolation Panel    Frequency of Communication with Friends and Family:  Not on file    Frequency of Social Gatherings with Friends and Family: Not on file    Attends Religious Services: Never    Active Member of Clubs or Organizations: No    Attends Banker Meetings: Never    Marital Status: Divorced  Catering manager Violence: Not At Risk (12/14/2022)   Humiliation, Afraid, Rape, and Kick questionnaire    Fear of Current or Ex-Partner: No    Emotionally Abused: No    Physically Abused: No    Sexually Abused: No    Vital Signs: Blood pressure 131/75, pulse 86, temperature 98.4 F (36.9 C), resp. rate 16, height 5' 9 (1.753 m), weight 209 lb (94.8 kg), SpO2 100%.  Examination: General Appearance: The patient is well-developed, well-nourished, and in no distress. Skin: Gross inspection of skin unremarkable. Head: normocephalic, no gross deformities. Eyes: no gross deformities noted. ENT: ears appear grossly normal no exudates. Neck: Supple. No thyromegaly. No LAD. Respiratory: no rhonchi noted. Cardiovascular: Normal S1 and S2 without murmur or rub. Extremities: No cyanosis. pulses are equal. Neurologic: Alert and oriented. No involuntary movements.  LABS: Recent Results (from the past 2160 hours)  HM DIABETES EYE EXAM     Status: None   Collection Time: 09/28/23 12:00 AM  Result Value Ref Range   HM Diabetic Eye Exam No Retinopathy No Retinopathy  Pulmonary Function Test     Status: None   Collection Time: 10/30/23  8:23 AM  Result Value Ref Range   FEV1     FVC     FEV1/FVC     TLC     DLCO    I-STAT creatinine     Status: None   Collection Time: 11/05/23 10:41 AM  Result Value Ref Range   Creatinine, Ser 0.90 0.61 - 1.24 mg/dL  Compliance Drug Analysis, Ur     Status: None   Collection Time: 11/07/23 11:09 AM  Result Value Ref Range   Summary FINAL     Comment: ==================================================================== Compliance Drug Analysis,  Ur ==================================================================== Test                             Result       Flag       Units  Drug Present and Declared for Prescription Verification   Oxazepam                       658          EXPECTED   ng/mg creat   Temazepam                       >3030        EXPECTED   ng/mg creat    Oxazepam and temazepam  are expected metabolites of diazepam.    Oxazepam is also an expected metabolite of other benzodiazepine    drugs, including chlordiazepoxide, prazepam, clorazepate, halazepam,    and temazepam .  Oxazepam and temazepam  are available as scheduled    prescription medications.    Cyclobenzaprine                 PRESENT      EXPECTED   Desmethylcyclobenzaprine       PRESENT      EXPECTED    Desmethylcyclobenzaprine is an  expected metabolite of    cyclobenzaprine .    Metoprolol                      PRESENT      EXPECTED  Drug Pre sent not Declared for Prescription Verification   Gabapentin                      PRESENT      UNEXPECTED  Drug Absent but Declared for Prescription Verification   Oxycodone                       Not Detected UNEXPECTED ng/mg creat   Pregabalin                      Not Detected UNEXPECTED   Bupropion                       Not Detected UNEXPECTED   Duloxetine                      Not Detected UNEXPECTED   Acetaminophen                   Not Detected UNEXPECTED    Acetaminophen , as indicated in the declared medication list, is not    always detected even when used as directed.    Salicylate                     Not Detected UNEXPECTED    Aspirin , as indicated in the declared medication list, is not always    detected even when used as directed.  ==================================================================== Test                      Result    Flag   Units      Ref Range   Creatinine              66               mg/dL      >=79 ========================================== ========================== Declared  Medications:  The flagging and interpretation on this report are based on the  following declared medications.  Unexpected results may arise from  inaccuracies in the declared medications.   **Note: The testing scope of this panel includes these medications:   Bupropion  (Wellbutrin  XL)  Cyclobenzaprine  (Flexeril )  Duloxetine  (Cymbalta )  Metoprolol  (Lopressor )  Oxycodone  (Roxicodone )  Pregabalin  (Lyrica )  Temazepam  (Restoril )   **Note: The testing scope of this panel does not include small to  moderate amounts of these reported medications:   Acetaminophen  (Tylenol )  Aspirin    **Note: The testing scope of this panel does not include the  following reported medications:   Albuterol  (Ventolin  HFA)  Atorvastatin  (Lipitor)  Budesonide (Breztri  Aerosphere)  Dapagliflozin  (Farxiga )  Ezetimibe  (Zetia )  Famotidine  (Pepcid )  Formoterol  (Breztri  Aerosphere)  Glycopyrrolate  (Breztri  Aerosphere)  Insulin  (Toujeo )  Melatonin  Mel oxicam (Mobic )  Nitroglycerin  (Nitrostat )  Pantoprazole  (Protonix )  Ropinirole  (Requip )  Semaglutide  (Ozempic )  Tamsulosin  (Flomax )  Varenicline  (Chantix ) ==================================================================== For clinical consultation, please call [phone]. ====================================================================   Comp. Metabolic Panel (12)     Status: Abnormal   Collection Time: 11/07/23 11:16 AM  Result Value Ref Range   Glucose 162 (H) 70 - 99 mg/dL   BUN 11 6 - 24 mg/dL   Creatinine, Ser 9.18 0.76 - 1.27 mg/dL   eGFR 897 >40 fO/fpw/8.26  BUN/Creatinine Ratio 14 9 - 20   Sodium 142 134 - 144 mmol/L   Potassium 4.8 3.5 - 5.2 mmol/L   Chloride 103 96 - 106 mmol/L   Calcium  10.5 (H) 8.7 - 10.2 mg/dL   Total Protein 7.5 6.0 - 8.5 g/dL   Albumin  4.5 3.8 - 4.9 g/dL   Globulin, Total 3.0 1.5 - 4.5 g/dL   Bilirubin Total 0.2 0.0 - 1.2 mg/dL   Alkaline Phosphatase 146 (H) 44 - 121 IU/L   AST 16 0 - 40 IU/L   Magnesium     Status: None   Collection Time: 11/07/23 11:16 AM  Result Value Ref Range   Magnesium 2.1 1.6 - 2.3 mg/dL  Vitamin B12     Status: Abnormal   Collection Time: 11/07/23 11:16 AM  Result Value Ref Range   Vitamin B-12 150 (L) 232 - 1,245 pg/mL  Sedimentation rate     Status: None   Collection Time: 11/07/23 11:16 AM  Result Value Ref Range   Sed Rate 12 0 - 30 mm/hr  25-Hydroxy vitamin D  Lcms D2+D3     Status: Abnormal   Collection Time: 11/07/23 11:16 AM  Result Value Ref Range   25-Hydroxy, Vitamin D  28 (L) ng/mL    Comment: Reference Range: All Ages: Target levels 30 - 100    25-Hydroxy, Vitamin D -2 <1.0 ng/mL    Comment: This test was developed and its performance characteristics determined by [facility]. It has not been cleared or approved by the Food and Drug Administration.     25-Hydroxy, Vitamin D -3 28 ng/mL    Comment: This test was developed and its performance characteristics determined by [facility]. It has not been cleared or approved by the Food and Drug Administration.    C-reactive protein     Status: None   Collection Time: 11/07/23 11:16 AM  Result Value Ref Range   CRP 3 0 - 10 mg/L  Pulmonary Function Test     Status: None   Collection Time: 11/13/23 11:14 AM  Result Value Ref Range   FEV1     FVC     FEV1/FVC     TLC     DLCO    CMP (Cancer Center only)     Status: Abnormal   Collection Time: 11/15/23  1:13 PM  Result Value Ref Range   Sodium 136 135 - 145 mmol/L   Potassium 4.6 3.5 - 5.1 mmol/L   Chloride 103 98 - 111 mmol/L   CO2 24 22 - 32 mmol/L   Glucose, Bld 257 (H) 70 - 99 mg/dL    Comment: Glucose reference range applies only to samples taken after fasting for at least 8 hours.   BUN 19 6 - 20 mg/dL   Creatinine 8.95 9.38 - 1.24 mg/dL   Calcium  9.6 8.9 - 10.3 mg/dL   Total Protein 7.7 6.5 - 8.1 g/dL   Albumin  4.2 3.5 - 5.0 g/dL   AST 17 15 - 41 U/L   ALT 19 0 - 44 U/L   Alkaline Phosphatase 121 38 - 126 U/L   Total  Bilirubin 0.5 0.0 - 1.2 mg/dL   GFR, Estimated >39 >39 mL/min    Comment: (NOTE) Calculated using the CKD-EPI Creatinine Equation (2021)    Anion gap 9 5 - 15    Comment: Performed at [facility], [address] [place], [place] [address]  CBC with Differential (Cancer Center Only)     Status: None   Collection Time:  11/15/23  1:13 PM  Result Value Ref Range   WBC Count 8.9 4.0 - 10.5 K/uL   RBC 4.64 4.22 - 5.81 MIL/uL   Hemoglobin 14.3 13.0 - 17.0 g/dL   HCT 58.3 60.9 - 47.9 %   MCV 89.7 80.0 - 100.0 fL   MCH 30.8 26.0 - 34.0 pg   MCHC 34.4 30.0 - 36.0 g/dL   RDW 86.7 88.4 - 84.4 %   Platelet Count 208 150 - 400 K/uL   nRBC 0.0 0.0 - 0.2 %   Neutrophils Relative % 56 %   Neutro Abs 5.1 1.7 - 7.7 K/uL   Lymphocytes Relative 32 %   Lymphs Abs 2.9 0.7 - 4.0 K/uL   Monocytes Relative 8 %   Monocytes Absolute 0.7 0.1 - 1.0 K/uL   Eosinophils Relative 2 %   Eosinophils Absolute 0.2 0.0 - 0.5 K/uL   Basophils Relative 1 %   Basophils Absolute 0.1 0.0 - 0.1 K/uL   Immature Granulocytes 1 %   Abs Immature Granulocytes 0.05 0.00 - 0.07 K/uL    Comment: Performed at [facility], [address] [place], [place] [address]  NM Myocar Multi W/Spect W/Wall Motion / EF     Status: None   Collection Time: 11/16/23 12:16 PM  Result Value Ref Range   Rest HR 69.0 bpm   Rest BP 112/76 mmHg   Peak HR 113 bpm   Peak BP 132/54 mmHg   ST Depression (mm) 0 mm   Rest Nuclear Isotope Dose 10.8 mCi   Stress Nuclear Isotope Dose 31.1 mCi   SSS 2.0    SRS 3.0    SDS 0.0    TID 1.04    Nuc Stress EF 65 %   LV sys vol 31.0 4.2 - 5.8 mL   LV dias vol 89.0 62 - 150 mL    Radiology: NM Myocar Multi W/Spect W/Wall Motion / EF Result Date: 11/16/2023   Abnormal pharmacologic myocardial perfusion stress test.   There is a small in size, mild in severity, fixed apical inferior and lateral defect most consistent with scar but cannot rule out artifact.   No significant ischemia is  identified.   Left ventricular systolic function is normal (LVEF 65%) with normal wall motion.   This is a low risk study.    No results found.  No results found.  Assessment and Plan: Patient Active Problem List   Diagnosis Date Noted   Grade 1 Retrolisthesis of C3/C4 11/28/2023   Grade 1 Anterolisthesis of cervical spine (C5/C6) 11/28/2023   Vitamin D  insufficiency 11/28/2023   Vitamin B 12 deficiency 11/28/2023   Chronic lower extremity pain (2ry area of Pain) (Right) 11/28/2023   Cervical facet joint pain 11/28/2023   Internal hemorrhoids 11/24/2023   History of lung cancer (Right) 11/07/2023   Chronic low back pain (Midline) w/o sciatica 11/07/2023   Chronic low back pain (1ry area of Pain) (Right) w/o sciatica 11/07/2023   Low back pain of over 3 months duration 11/07/2023   Low back pain potentially associated with radiculopathy 11/07/2023   Low back pain radiating to right leg 11/07/2023   Multifactorial low back pain 11/07/2023   Chronic hip pain (3ry area of Pain) (Right) 11/07/2023   Chronic sacroiliac joint pain (Right) 11/07/2023   Somatic dysfunction of right sacroiliac joint 11/07/2023   Lumbar facet joint pain 11/07/2023   Chronic neck pain (4th area of Pain) (Right) 11/07/2023   Cervicalgia 11/07/2023   Cervicogenic  headache (5th area of Pain) (Right) 11/07/2023   Chronic pain syndrome 11/06/2023   Pharmacologic therapy 11/06/2023   Disorder of skeletal system 11/06/2023   Problems influencing health status 11/06/2023   Lumbar radiculopathy, chronic 09/25/2023   Spinal stenosis of lumbar region with neurogenic claudication 09/25/2023   Diabetic polyneuropathy associated with type 2 diabetes mellitus (HCC) 09/05/2023   Diabetes mellitus (HCC) 09/05/2023   Chronic diastolic heart failure (HCC) 09/05/2023   Obesity, morbid (HCC) 09/05/2023   Primary adenocarcinoma of right lung (HCC) 11/21/2022   Type 2 diabetes mellitus with hyperglycemia, with long-term  current use of insulin  (HCC) 11/21/2022   Pleural effusion on right 11/20/2022   S/P robot-assisted Video Assisted Thoracoscopy with Right Lower Lobe Superior Segmentectomy, Node Dissection, and Intercostal Nerve Block 10/28/2022   Lung nodule 10/27/2022   Right lower lobe pulmonary nodule 10/27/2022   Aortic atherosclerosis (HCC) 12/07/2021   Abnormal LFTs 04/22/2021   Insomnia due to medical condition 12/13/2020   At risk for prolonged QT interval syndrome 12/13/2020   Hypersomnia with sleep apnea 12/13/2020   Mixed hyperlipidemia 09/26/2020   Pulmonary emphysema (HCC) 09/26/2020   Barrett's esophagus without dysplasia    Gastric erythema    Special screening for malignant neoplasms, colon    Polyp of colon    MDD (major depressive disorder), recurrent episode, mild (HCC) 06/23/2020   Noncompliance with diabetes treatment 04/12/2020   Angina pectoris (HCC) 04/12/2020   MDD (major depressive disorder), recurrent, in full remission (HCC) 01/05/2020   MDD (major depressive disorder), recurrent, in partial remission (HCC) 10/30/2019   Tobacco use disorder 07/02/2019   Right upper quadrant pain    Encounter for other preprocedural examination 01/20/2019   PTSD (post-traumatic stress disorder) 01/20/2019   MDD (major depressive disorder), recurrent episode, moderate (HCC) 01/20/2019   Insomnia due to mental disorder 01/20/2019   Calculus of gallbladder with acute on chronic cholecystitis without obstruction 11/15/2018   Generalized abdominal pain 10/08/2018   Local superficial swelling, mass or lump 10/08/2018   Enterocolitis 08/16/2018   Left upper quadrant abdominal pain of unknown etiology 08/16/2018   Diastasis recti 06/19/2018   Anxiety disorder 04/01/2018   Clotted blood in stool 12/31/2017   Diarrhea 12/31/2017   Encounter for general adult medical examination with abnormal findings 10/27/2017   Uncontrolled type 2 diabetes mellitus with hyperglycemia (HCC) 10/27/2017    Onychomycosis with ingrown toenail 10/27/2017   Dysuria 10/27/2017   Uncontrolled type 2 diabetes mellitus with hypoglycemia without coma (HCC) 07/08/2017   Shortness of breath 07/08/2017   Cigarette nicotine  dependence without complication 07/08/2017   Influenza A 05/30/2017   OSA (obstructive sleep apnea) 08/12/2015   Cervical spondylosis 06/14/2015   Hypertension associated with diabetes (HCC) 04/08/2015   Depression 04/08/2015   Gastroesophageal reflux disease without esophagitis 01/05/2015   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 Pulmonary emphysema, unspecified emphysema type (HCC) (Primary) He is on inhalers and gets relief for symptoms. Unfortunately still smoking. Needs to stop smoking. Last PFT on 7/25  2. SOB (shortness of breath) Combination cardiac and lung along with ongoing smoking. Will refer to cards - Ambulatory referral to Cardiology  3. Cigarette smoker STOP SMOKING counseling provided again   General Counseling: I have discussed the findings of the evaluation and examination with Caileb.  I have also discussed any further diagnostic evaluation thatmay be needed or ordered today. Deivi verbalizes understanding of the findings of todays visit. We also reviewed his medications today and discussed  drug interactions and side effects including but not limited excessive drowsiness and altered mental states. We also discussed that there is always a risk not just to him but also people around him. he has been encouraged to call the office with any questions or concerns that should arise related to todays visit.  No orders of the defined types were placed in this encounter.    Time spent: 31  I have personally obtained a history, examined the patient, evaluated laboratory and imaging results, formulated the assessment and plan and placed orders.    Elfreda DELENA Bathe, MD [facility] Pulmonary and Critical [facility] Sleep medicine

## 2023-12-18 ENCOUNTER — Ambulatory Visit (HOSPITAL_BASED_OUTPATIENT_CLINIC_OR_DEPARTMENT_OTHER): Admitting: Pain Medicine

## 2023-12-18 ENCOUNTER — Encounter: Payer: Self-pay | Admitting: Pain Medicine

## 2023-12-18 ENCOUNTER — Ambulatory Visit
Admission: RE | Admit: 2023-12-18 | Discharge: 2023-12-18 | Disposition: A | Discharge status: Home / Self Care | Source: Ambulatory Visit | Attending: Pain Medicine | Admitting: Pain Medicine

## 2023-12-18 ENCOUNTER — Other Ambulatory Visit: Payer: Self-pay | Admitting: Nurse Practitioner

## 2023-12-18 VITALS — BP 124/84 | HR 74 | Temp 98.7°F | Resp 19 | Ht 69.0 in | Wt 209.0 lb

## 2023-12-18 DIAGNOSIS — M545 Low back pain, unspecified: Secondary | ICD-10-CM | POA: Insufficient documentation

## 2023-12-18 DIAGNOSIS — M533 Sacrococcygeal disorders, not elsewhere classified: Secondary | ICD-10-CM | POA: Insufficient documentation

## 2023-12-18 DIAGNOSIS — M9904 Segmental and somatic dysfunction of sacral region: Secondary | ICD-10-CM | POA: Insufficient documentation

## 2023-12-18 DIAGNOSIS — J439 Emphysema, unspecified: Secondary | ICD-10-CM

## 2023-12-18 DIAGNOSIS — M461 Sacroiliitis, not elsewhere classified: Secondary | ICD-10-CM | POA: Insufficient documentation

## 2023-12-18 DIAGNOSIS — G8929 Other chronic pain: Secondary | ICD-10-CM | POA: Diagnosis not present

## 2023-12-18 MED ORDER — FENTANYL CITRATE (PF) 100 MCG/2ML IJ SOLN
INTRAMUSCULAR | Status: AC
  Filled 2023-12-18: qty 2

## 2023-12-18 MED ORDER — LIDOCAINE HCL 2 % IJ SOLN
20.0000 mL | Freq: Once | INTRAMUSCULAR | Status: AC
  Administered 2023-12-18: 100 mg

## 2023-12-18 MED ORDER — METHYLPREDNISOLONE ACETATE 80 MG/ML IJ SUSP
INTRAMUSCULAR | Status: AC
  Filled 2023-12-18: qty 1

## 2023-12-18 MED ORDER — METHYLPREDNISOLONE ACETATE 80 MG/ML IJ SUSP
80.0000 mg | Freq: Once | INTRAMUSCULAR | Status: AC
  Administered 2023-12-18: 80 mg via INTRA_ARTICULAR

## 2023-12-18 MED ORDER — PENTAFLUOROPROP-TETRAFLUOROETH EX AERO: INHALATION_SPRAY | Freq: Once | CUTANEOUS | Status: DC

## 2023-12-18 MED ORDER — MIDAZOLAM HCL 5 MG/5ML IJ SOLN
INTRAMUSCULAR | Status: AC
Start: 2023-12-18 — End: 2023-12-18
  Filled 2023-12-18: qty 5

## 2023-12-18 MED ORDER — ROPIVACAINE HCL 2 MG/ML IJ SOLN
INTRAMUSCULAR | Status: AC
  Filled 2023-12-18: qty 20

## 2023-12-18 MED ORDER — MIDAZOLAM HCL 5 MG/5ML IJ SOLN
0.5000 mg | Freq: Once | INTRAMUSCULAR | Status: AC
  Administered 2023-12-18: 2 mg via INTRAVENOUS

## 2023-12-18 MED ORDER — LIDOCAINE HCL (PF) 2 % IJ SOLN
INTRAMUSCULAR | Status: AC
Start: 2023-12-18 — End: 2023-12-18
  Filled 2023-12-18: qty 10

## 2023-12-18 MED ORDER — ROPIVACAINE HCL 2 MG/ML IJ SOLN
9.0000 mL | Freq: Once | INTRAMUSCULAR | Status: AC
  Administered 2023-12-18: 9 mL via INTRA_ARTICULAR

## 2023-12-18 MED ORDER — FENTANYL CITRATE (PF) 100 MCG/2ML IJ SOLN: 25.0000 ug | INTRAMUSCULAR | Status: DC | PRN

## 2023-12-18 NOTE — Patient Instructions (Signed)

## 2023-12-18 NOTE — Progress Notes (Signed)
 PROVIDER NOTE: Interpretation of information contained herein should be left to medically-trained personnel. Specific patient instructions are provided elsewhere under Patient Instructions section of medical record. This document was created in part using STT-dictation technology, any transcriptional errors that may result from this process are unintentional.  Patient: Raymond [name] Type: Established DOB: [birth year] MRN: [id number] PCP: Liana Fish, NP  Service: Procedure DOS: 12/18/2023 Setting: Ambulatory Location: Ambulatory outpatient facility Delivery: Face-to-face Provider: Eric DELENA Como, MD Specialty: Interventional Pain Management Specialty designation: 09 Location: Outpatient facility Ref. Prov.: Liana Fish, NP       Interventional Therapy   Procedure: Sacroiliac Joint Steroid Injection #1    Laterality: Right     Level: PSIS (Posterior Superior Iliac Spine)  Target: Interarticular sacroiliac joint. Location: Medial to the postero-medial edge of iliac spine. Region: Lumbosacral-sacrococcygeal. Approach: Superior & inferior postero-medial percutaneous approach. Type of procedure: Percutaneous joint injection.  Imaging: Fluoroscopy-guided Non-spinal ([id number]) Anesthesia: Local anesthesia (1-2% Lidocaine ) Anxiolysis: IV Versed  2.0 mg Sedation: Minimal Sedation None required. No Fentanyl  administered.         DOS: 12/18/2023  Performed by: Eric DELENA Como, MD  Purpose: Diagnostic/Therapeutic Indications: Sacroiliac joint pain in the lower back and hip area severe enough to impact quality of life or function. Rationale (medical necessity): procedure needed and proper for the diagnosis and/or treatment of Raymond [name]'s medical symptoms and needs. 1. Chronic low back pain (1ry area of Pain) (Right) w/o sciatica   2. Chronic sacroiliac joint pain (Right)   3. Low back pain of over 3 months duration   4. Somatic dysfunction of sacroiliac joint (Right)    5. Osteoarthritis of sacroiliac joint (HCC) (Right)   6. Sacroiliac joint dysfunction (Right)    NAS-11 Pain score:   Pre-procedure: 6 /10   Post-procedure: 0-No pain/10      Position / Prep / Materials:  Position: Prone  Prep solution: ChloraPrep (2% chlorhexidine  gluconate and 70% isopropyl alcohol) Prep Area: Entire posterior lumbosacral area  Materials:  Tray: Block Needle(s):  Type: Spinal  Gauge (G): 22  Length: 5-in Qty: One (1) per procedure side.  H&P (Pre-op Assessment):  Raymond [name] is a 58 y.o. (year old), male patient, seen today for interventional treatment. He  has a past surgical history that includes Shoulder surgery (Right); Clavicle surgery (Right);  ingrown toenail removal; Colonoscopy (2017); Upper gi endoscopy (2017); Mouth surgery; Cholecystectomy (N/A, 01/23/2019); Esophagogastroduodenoscopy (egd) with propofol  (N/A, 08/25/2020); Colonoscopy with propofol  (N/A, 08/25/2020); Xi robotic assisted thoracoscopy- segmentectomy (Right, 10/27/2022); Lymph node dissection (Right, 10/27/2022); IR THORACENTESIS ASP PLEURAL SPACE W/IMG GUIDE (11/20/2022); and Lung lobectomy (Right, 2024). Raymond [name] has a current medication list which includes the following prescription(s): accu-chek softclix lancets, acetaminophen , albuterol , aspirin  ec, atorvastatin , accu-chek guide me, bupropion , bupropion , vitamin d3, dexcom g7 sensor, vitamin b-12, cyclobenzaprine , dapagliflozin  propanediol, duloxetine , duloxetine , ergocalciferol , ezetimibe , famotidine , accu-chek guide, toujeo  max solostar, humalog  kwikpen, insulin  pen needle, melatonin, meloxicam , metoprolol  tartrate, nitroglycerin , oxycodone , pantoprazole , pregabalin , ropinirole , ozempic  (1 mg/dose), tamsulosin , temazepam , varenicline , and breztri  aerosphere, and the following Facility-Administered Medications: fentanyl  and pentafluoroprop-tetrafluoroeth. His primarily concern today is the Back Pain (lower)  Initial Vital Signs:   Pulse/HCG Rate: 74ECG Heart Rate: 63 Temp: 98.7 F (37.1 C) Resp: 16 BP: 138/83 SpO2: 100 %  BMI: Estimated body mass index is 30.86 kg/m as calculated from the following:   Height as of this encounter: 5' 9 (1.753 m).   Weight as of this encounter: 209 lb (94.8 kg).  Risk Assessment: Allergies: Reviewed. He is allergic  to onion, norco [hydrocodone-acetaminophen ], nickel, and nicoderm [nicotine ].  Allergy Precautions: None required Coagulopathies: Reviewed. None identified.  Blood-thinner therapy: None at this time Active Infection(s): Reviewed. None identified. Raymond [name] is afebrile  Site Confirmation: Raymond [name] was asked to confirm the procedure and laterality before marking the site Procedure checklist: Completed Consent: Before the procedure and under the influence of no sedative(s), amnesic(s), or anxiolytics, the patient was informed of the treatment options, risks and possible complications. To fulfill our ethical and legal obligations, as recommended by the American Medical Association's Code of Ethics, I have informed the patient of my clinical impression; the nature and purpose of the treatment or procedure; the risks, benefits, and possible complications of the intervention; the alternatives, including doing nothing; the risk(s) and benefit(s) of the alternative treatment(s) or procedure(s); and the risk(s) and benefit(s) of doing nothing. The patient was provided information about the general risks and possible complications associated with the procedure. These may include, but are not limited to: failure to achieve desired goals, infection, bleeding, organ or nerve damage, allergic reactions, paralysis, and death. In addition, the patient was informed of those risks and complications associated to the procedure, such as failure to decrease pain; infection; bleeding; organ or nerve damage with subsequent damage to sensory, motor, and/or autonomic systems, resulting in  permanent pain, numbness, and/or weakness of one or several areas of the body; allergic reactions; (i.e.: anaphylactic reaction); and/or death. Furthermore, the patient was informed of those risks and complications associated with the medications. These include, but are not limited to: allergic reactions (i.e.: anaphylactic or anaphylactoid reaction(s)); adrenal axis suppression; blood sugar elevation that in diabetics may result in ketoacidosis or comma; water retention that in patients with history of congestive heart failure may result in shortness of breath, pulmonary edema, and decompensation with resultant heart failure; weight gain; swelling or edema; medication-induced neural toxicity; particulate matter embolism and blood vessel occlusion with resultant organ, and/or nervous system infarction; and/or aseptic necrosis of one or more joints. Finally, the patient was informed that Medicine is not an exact science; therefore, there is also the possibility of unforeseen or unpredictable risks and/or possible complications that may result in a catastrophic outcome. The patient indicated having understood very clearly. We have given the patient no guarantees and we have made no promises. Enough time was given to the patient to ask questions, all of which were answered to the patient's satisfaction. Mr. Martensen has indicated that he wanted to continue with the procedure. Attestation: I, the ordering provider, attest that I have discussed with the patient the benefits, risks, side-effects, alternatives, likelihood of achieving goals, and potential problems during recovery for the procedure that I have provided informed consent. Date  Time: 12/18/2023 11:22 AM  Pre-Procedure Preparation:  Monitoring: As per clinic protocol. Respiration, ETCO2, SpO2, BP, heart rate and rhythm monitor placed and checked for adequate function Safety Precautions: Patient was assessed for positional comfort and pressure points  before starting the procedure. Time-out: I initiated and conducted the Time-out before starting the procedure, as per protocol. The patient was asked to participate by confirming the accuracy of the Time Out information. Verification of the correct person, site, and procedure were performed and confirmed by me, the nursing staff, and the patient. Time-out conducted as per Joint Commission's Universal Protocol (UP.01.01.01). Time: 1237 Start Time:   hrs.  Description/Narrative of Procedure:          Start Time:   hrs.  Rationale (medical necessity): procedure needed and proper  for the diagnosis and/or treatment of the patient's medical symptoms and needs. Procedural Technique Safety Precautions: Aspiration looking for blood return was conducted prior to all injections. At no point did we inject any substances, as a needle was being advanced. No attempts were made at seeking any paresthesias. Safe injection practices and needle disposal techniques used. Medications properly checked for expiration dates. SDV (single dose vial) medications used. Description of the Procedure: Protocol guidelines were followed. The patient was assisted into a comfortable position. The target area was identified and the area prepped in the usual manner. Skin & deeper tissues infiltrated with local anesthetic. Appropriate amount of time allowed to pass for local anesthetics to take effect. The procedure needles were then advanced to the target area. Proper needle placement secured. Negative aspiration confirmed. Solution injected in intermittent fashion, asking for systemic symptoms every 0.5cc of injectate. The needles were then removed and the area cleansed, making sure to leave some of the prepping solution back to take advantage of its long term bactericidal properties.  Technical description of procedure:  Fluoroscopy using a posterior anterior 45 degree angle from the midline aiming at the anterolateral aspect of  the patient was used to find a direct path into the sacroiliac joint, the superior medial to posterior superior iliac spine.  The skin was marked where the desired target and the skin infiltrated with local anesthetics.  The procedure needle was then advanced until the joint was entered.  Once inside of the joint, we then proceeded to inject the desired solution.  Vitals:   12/18/23 1121 12/18/23 1237 12/18/23 1244 12/18/23 1257  BP: 138/83 132/86 106/87 124/84  Pulse: 74     Resp: 16 20 19    Temp: 98.7 F (37.1 C)     TempSrc: Temporal     SpO2: 100% 100% 100% 98%  Weight: 209 lb (94.8 kg)     Height: 5' 9 (1.753 m)        End Time: 1242 hrs.  Imaging Guidance (Non-Spinal):          Type of Imaging Technique: Fluoroscopy Guidance (Non-Spinal) Indication(s): Fluoroscopy guidance for needle placement to enhance accuracy in procedures requiring precise needle localization for targeted delivery of medication in or near specific anatomical locations not easily accessible without such real-time imaging assistance. Exposure Time: Please see nurses notes. Contrast: None used. Fluoroscopic Guidance: I was personally present during the use of fluoroscopy. Tunnel Vision Technique used to obtain the best possible view of the target area. Parallax error corrected before commencing the procedure. Direction-depth-direction technique used to introduce the needle under continuous pulsed fluoroscopy. Once target was reached, antero-posterior, oblique, and lateral fluoroscopic projection used confirm needle placement in all planes. Images permanently stored in EMR. Interpretation: No contrast injected. I personally interpreted the imaging intraoperatively. Adequate needle placement confirmed in multiple planes. Permanent images saved into the patient's record.  Post-operative Assessment:  Post-procedure Vital Signs:  Pulse/HCG Rate: 7470 Temp: 98.7 F (37.1 C) Resp: 19 BP: 124/84 SpO2: 98  %  EBL: None  Complications: No immediate post-treatment complications observed by team, or reported by patient.  Note: The patient tolerated the entire procedure well. A repeat set of vitals were taken after the procedure and the patient was kept under observation following institutional policy, for this type of procedure. Post-procedural neurological assessment was performed, showing return to baseline, prior to discharge. The patient was provided with post-procedure discharge instructions, including a section on how to identify potential problems. Should any problems arise  concerning this procedure, the patient was given instructions to immediately contact us , at any time, without hesitation. In any case, we plan to contact the patient by telephone for a follow-up status report regarding this interventional procedure.  Comments:  No additional relevant information.  Plan of Care (POC)  Orders:  Orders Placed This Encounter  Procedures   SACROILIAC JOINT INJECTION    Scheduling Instructions:     Side: Right-sided     Sedation: With Sedation.     Date: 12/18/2023    Where will this procedure be performed?:   [facility]   [facility] C-ARM 1-60 MIN NO REPORT    Intraoperative interpretation by procedural physician at [facility].    Standing Status:   Standing    Number of Occurrences:   1    Reason for exam::   Assistance in needle guidance and placement for procedures requiring needle placement in or near specific anatomical locations not easily accessible without such assistance.   Informed Consent Details: Physician/Practitioner Attestation; Transcribe to consent form and obtain patient signature    Nursing Order: Transcribe to consent form and obtain patient signature. Note: Always confirm laterality of pain with Mr. Ketchum, before procedure.    Physician/Practitioner attestation of informed consent for procedure/surgical case:   I, the physician/practitioner,  attest that I have discussed with the patient the benefits, risks, side effects, alternatives, likelihood of achieving goals and potential problems during recovery for the procedure that I have provided informed consent.    Procedure:   Sacroiliac Joint Block    Physician/Practitioner performing the procedure:   Spiros Greenfeld A. Tanya, MD    Indication/Reason:   Chronic Low Back and Hip Pain secondary to Sacroiliac Joint Pain (Arthralgia/Arthropathy)   Provide equipment / supplies at bedside    Procedure tray: Block Tray (Disposable  single use) Skin infiltration needle: Regular 1.5-in, 25-G, (x1) Block Needle type: Spinal Amount/quantity: 1 Size: Medium (5-inch) Gauge: 22G    Standing Status:   Standing    Number of Occurrences:   1    Specify:   Block Tray   Saline lock IV    Have LR [phone] mL available and administer at 125 mL/hr if patient becomes hypotensive.    Standing Status:   Standing    Number of Occurrences:   1     Opioid Analgesic: None MME/day: 0 mg/day    Medications ordered for procedure: Meds ordered this encounter  Medications   lidocaine  (XYLOCAINE ) 2 % (with pres) injection 400 mg   pentafluoroprop-tetrafluoroeth (GEBAUERS) aerosol   midazolam  (VERSED ) 5 MG/5ML injection 0.5-2 mg    Make sure Flumazenil is available in the pyxis when using this medication. If oversedation occurs, administer 0.2 mg IV over 15 sec. If after 45 sec no response, administer 0.2 mg again over 1 min; may repeat at 1 min intervals; not to exceed 4 doses (1 mg)   fentaNYL  (SUBLIMAZE ) injection 25-50 mcg    Make sure Narcan is available in the pyxis when using this medication. In the event of respiratory depression (RR< 8/min): Titrate NARCAN (naloxone) in increments of 0.1 to 0.2 mg IV at 2-3 minute intervals, until desired degree of reversal.   ropivacaine  (PF) 2 mg/mL (0.2%) (NAROPIN ) injection 9 mL   methylPREDNISolone  acetate (DEPO-MEDROL ) injection 80 mg   Medications  administered: We administered lidocaine , midazolam , ropivacaine  (PF) 2 mg/mL (0.2%), and methylPREDNISolone  acetate.  See the medical record for exact dosing, route, and time of administration.  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 (Local anesthetic[LA] +/- sedation  L.A.Local Anesthetic  Steroid benefit  Ongoing benefit)       Follow-up plan:   Return in about 2 weeks (around 01/01/2024) for Eval-day (M,W), (Face2F), (PPE).     Recent Visits Date Type Provider Dept  11/28/23 Office Visit Tanya Glisson, MD [facility]  11/07/23 Office Visit Tanya Glisson, MD [facility]  Showing recent visits within past 90 days and meeting all other requirements Today's Visits Date Type Provider Dept  12/18/23 Procedure visit Tanya Glisson, MD [facility]  Showing today's visits and meeting all other requirements Future Appointments Date Type Provider Dept  01/02/24 Appointment Tanya Glisson, MD [facility]  Showing future appointments within next 90 days and meeting all other requirements   Disposition: Discharge home  Discharge (Date  Time): 12/18/2023; 1259 hrs.   Primary Care Physician: Liana Fish, NP Location: [facility] Note by: Glisson DELENA Tanya, MD (TTS technology used. I apologize for any typographical errors that were not detected and corrected.) Date: 12/18/2023;  Time: 1:05 PM  Disclaimer:  Medicine is not an Visual merchandiser. The only guarantee in medicine is that nothing is guaranteed. It is important to note that the decision to proceed with this intervention was based on the information collected from the patient. The Data and conclusions were drawn from the patient's questionnaire, the interview, and the physical examination. Because the information was provided in large part by the patient, it cannot be guaranteed that it has not been purposely or unconsciously manipulated. Every effort has been made to obtain as much relevant data as possible for this evaluation. It is important to note that the conclusions that lead to this procedure are derived in large part from the available data. Always take into account that the treatment will also be dependent on availability of resources and existing treatment guidelines, considered by other Pain Management Practitioners as being common knowledge and practice, at the time of the intervention. For Medico-Legal purposes, it is also important to point out that variation in procedural techniques and pharmacological choices are the acceptable norm. The indications, contraindications, technique, and results of the above procedure should only be interpreted and judged by a Board-Certified Interventional Pain Specialist with extensive familiarity and expertise in the same exact procedure and technique.

## 2023-12-31 ENCOUNTER — Telehealth (INDEPENDENT_AMBULATORY_CARE_PROVIDER_SITE_OTHER): Admitting: Psychiatry

## 2023-12-31 ENCOUNTER — Encounter: Payer: Self-pay | Admitting: Psychiatry

## 2023-12-31 ENCOUNTER — Other Ambulatory Visit: Payer: Self-pay | Admitting: Nurse Practitioner

## 2023-12-31 DIAGNOSIS — F3342 Major depressive disorder, recurrent, in full remission: Secondary | ICD-10-CM | POA: Diagnosis not present

## 2023-12-31 DIAGNOSIS — I700 Atherosclerosis of aorta: Secondary | ICD-10-CM

## 2023-12-31 DIAGNOSIS — F4310 Post-traumatic stress disorder, unspecified: Secondary | ICD-10-CM | POA: Diagnosis not present

## 2023-12-31 DIAGNOSIS — K219 Gastro-esophageal reflux disease without esophagitis: Secondary | ICD-10-CM

## 2023-12-31 DIAGNOSIS — F418 Other specified anxiety disorders: Secondary | ICD-10-CM | POA: Diagnosis not present

## 2023-12-31 DIAGNOSIS — R52 Pain, unspecified: Secondary | ICD-10-CM

## 2023-12-31 DIAGNOSIS — Z79899 Other long term (current) drug therapy: Secondary | ICD-10-CM

## 2023-12-31 DIAGNOSIS — F17200 Nicotine dependence, unspecified, uncomplicated: Secondary | ICD-10-CM

## 2023-12-31 DIAGNOSIS — G4733 Obstructive sleep apnea (adult) (pediatric): Secondary | ICD-10-CM

## 2023-12-31 DIAGNOSIS — G4701 Insomnia due to medical condition: Secondary | ICD-10-CM | POA: Diagnosis not present

## 2023-12-31 NOTE — Progress Notes (Signed)
 Virtual Visit via Video Note  I connected with Raymond [name] on 12/31/23 at  1:40 PM EDT by a video enabled telemedicine application and verified that I am speaking with the correct person using two identifiers.  Location Provider Location : [facility] Patient Location : Home  Participants: Patient ,Brother, Provider   I discussed the limitations of evaluation and management by telemedicine and the availability of in person appointments. The patient expressed understanding and agreed to proceed.   I discussed the assessment and treatment plan with the patient. The patient was provided an opportunity to ask questions and all were answered. The patient agreed with the plan and demonstrated an understanding of the instructions.   The patient was advised to call back or seek an in-person evaluation if the symptoms worsen or if the condition fails to improve as anticipated.  BH MD OP Progress Note  12/31/2023 2:40 PM Raymond [name]  MRN:  [id number]  Chief Complaint:  Chief Complaint  Patient presents with   Follow-up   Anxiety   Depression   Medication Refill   Insomnia   Discussed the use of AI scribe software for clinical note transcription with the patient, who gave verbal consent to proceed.  History of Present Illness Raymond [name] is a 58 year old Caucasian male, lives in [place], divorced, has a history of MDD, PTSD, insomnia, anxiety, tobacco use disorder, hyperlipidemia, diabetes mellitus, hepatic steatosis, lung cancer with right lower lobe superior segmentectomy (10/27/2022 ) was evaluated by telemedicine today.  He reports that he is okay with his depression, and anxiety symptoms. He reports no concerns regarding his antidepressant medications and wishes to continue his current regimen. His current medications include Cymbalta  90 mg, Wellbutrin  XL 450 mg, hydroxyzine  25-50 mg at bedtime as needed, temazepam  30 mg for sleep, and ropinirole  0.25 mg three times daily. He  describes getting about 6 hours of sleep per night, which he feels is sufficient.  He is compliant on CPAP for obstructive sleep apnea  He denies any thoughts about hurting himself or others.  He reports current tobacco use, smoking approximately half a pack of cigarettes per day, unchanged since the last visit. He currently takes varenicline  (Chantix ) 1 mg twice daily for smoking cessation, with minimal effect on cravings. Recently, he began using nicotine  lozenges but reports they increase thoughts about smoking. He identifies stress or habit as contributing factors to continued smoking.  He lives at home with his brother. Over the weekend, he attended 2 grandchildren's birthday parties. He is considering returning to the gym for exercise but reports financial and transportation barriers.  He follows up with his oncologist every 6 months or so.      Visit Diagnosis:    ICD-10-CM   1. MDD (major depressive disorder), recurrent, in full remission (HCC)  F33.42     2. PTSD (post-traumatic stress disorder)  F43.10     3. Insomnia due to medical condition  G47.01    Pain, OSA , Periodic limb movement    4. Other specified anxiety disorders  F41.8    Generalized anxiety not occurring mpre days than not    5. Tobacco use disorder  F17.200    moderate      Past Psychiatric History: I have reviewed past psychiatric history from progress note on 05/23/2018.  Past trials of Tegretol , doxepin, Seroquel , mirtazapine , Belsomra , Lunesta, Ativan , risperidone, Lexapro .  Past Medical History:  Past Medical History:  Diagnosis Date   Anxiety    Arthritis  NECK   Bronchitis    Cancer (HCC)    R lung, had lobectomy   Coronary artery disease    Depression    Diabetes mellitus without complication (HCC)    Diastasis of rectus abdominis 06/19/2018   Dyspnea    DUE TO GALLBLADDER PER PT   Dysrhythmia    fast hear rate at times. stopped when placed on metoprolol    Emphysema lung (HCC)     Family history of adverse reaction to anesthesia    N/V, TROUBLE BREATHING COMING OUT OF ANESTHESIA   Fatty liver    GERD (gastroesophageal reflux disease)    Headache    MIGRAINES   Hyperlipidemia    Hypertension    Irregular heart beat unk   Myocardial infarction (HCC) 2010   MILD    PTSD (post-traumatic stress disorder)    Sleep apnea    USES CPAP   Tachycardia    Ventral hernia     Past Surgical History:  Procedure Laterality Date    ingrown toenail removal     CHOLECYSTECTOMY N/A 01/23/2019   Procedure: LAPAROSCOPIC CHOLECYSTECTOMY;  Surgeon: Desiderio Schanz, MD;  Location: [facility] ORS;  Service: General;  Laterality: N/A;   CLAVICLE SURGERY Right    COLONOSCOPY  2017   COLONOSCOPY WITH PROPOFOL  N/A 08/25/2020   Procedure: COLONOSCOPY WITH PROPOFOL ;  Surgeon: Janalyn [name] NOVAK, MD;  Location: [facility] END[facility];  Service: Endoscopy;  Laterality: N/A;   ESOPHAGOGASTRODUODENOSCOPY (EGD) WITH PROPOFOL  N/A 08/25/2020   Procedure: ESOPHAGOGASTRODUODENOSCOPY (EGD) WITH PROPOFOL ;  Surgeon: Janalyn [name] NOVAK, MD;  Location: [facility] ENDOSCOPY;  Service: Endoscopy;  Laterality: N/A;   IR THORACENTESIS ASP PLEURAL SPACE W/IMG GUIDE  11/20/2022   LUNG LOBECTOMY Right 2024   LYMPH NODE DISSECTION Right 10/27/2022   Procedure: LYMPH NODE DISSECTION;  Surgeon: Kerrin Elspeth BROCKS, MD;  Location: [facility] OR;  Service: Thoracic;  Laterality: Right;   MOUTH SURGERY     REMOVED ALL TEETH   SHOULDER SURGERY Right    UPPER GI ENDOSCOPY  2017   XI ROBOTIC ASSISTED THORACOSCOPY- SEGMENTECTOMY Right 10/27/2022   Procedure: XI ROBOTIC ASSISTED THORACOSCOPY-RIGHT LOWER LOBE SUPERIOR SEGMENTECTOMY;  Surgeon: Kerrin Elspeth BROCKS, MD;  Location: [facility];  Service: Thoracic;  Laterality: Right;    Family Psychiatric History: I have reviewed family psychiatric history from progress note on 05/23/2018.  Family History:  Family History  Problem Relation Age of Onset   Hypertension Mother    Diabetes type II  Mother    Diabetes Mother    COPD Father    Lung cancer Father    Heart disease Father    Diabetes Sister    Anxiety disorder Sister    Depression Sister    Diabetes Sister    Anxiety disorder Sister    Depression Sister    Diabetes Sister    Anxiety disorder Sister    Depression Sister    Diabetes Sister    Anxiety disorder Sister    Depression Sister    Diabetes Brother    Anxiety disorder Brother    Depression Brother    Diabetes Maternal Aunt    Colon cancer Maternal Uncle     Social History: Reviewed social history from progress note on 05/23/2018. Social History   Socioeconomic History   Marital status: Divorced    Spouse name: Not on file   Number of children: 4   Years of education: Not on file   Highest education level: 8th grade  Occupational History   Not on file  Tobacco Use   Smoking status: Every Day    Current packs/day: 0.25    Average packs/day: 0.3 packs/day for 40.0 years (10.0 ttl pk-yrs)    Types: Cigarettes   Smokeless tobacco: Former    Types: Chew   Tobacco comments:    Reports smoking 1/2 pack a day and trying to cut back   Vaping Use   Vaping status: Former  Substance and Sexual Activity   Alcohol use: No    Alcohol/week: 0.0 standard drinks of alcohol   Drug use: No   Sexual activity: Not Currently  Other Topics Concern   Not on file  Social History Narrative   Not on file   Social Drivers of Health   Financial Resource Strain: Low Risk  (08/17/2020)   Overall Financial Resource Strain (CARDIA)    Difficulty of Paying Living Expenses: Not very hard  Food Insecurity: Food Insecurity Present (12/14/2022)   Hunger Vital Sign    Worried About Running Out of Food in the Last Year: Often true    Ran Out of Food in the Last Year: Often true  Transportation Needs: No Transportation Needs (12/14/2022)   PRAPARE - Administrator, Civil Service (Medical): No    Lack of Transportation (Non-Medical): No  Physical Activity:  Inactive (05/23/2018)   Exercise Vital Sign    Days of Exercise per Week: 0 days    Minutes of Exercise per Session: 0 min  Stress: Stress Concern Present (05/23/2018)   [employer] of Occupational Health - Occupational Stress Questionnaire    Feeling of Stress : Very much  Social Connections: Unknown (05/23/2018)   Social Connection and Isolation Panel    Frequency of Communication with Friends and Family: Not on file    Frequency of Social Gatherings with Friends and Family: Not on file    Attends Religious Services: Never    Active Member of Clubs or Organizations: No    Attends Banker Meetings: Never    Marital Status: Divorced    Allergies:  Allergies  Allergen Reactions   Onion Anaphylaxis   Norco [Hydrocodone-Acetaminophen ] Itching   Nickel Rash   Nicoderm [Nicotine ] Rash    Metabolic Disorder Labs: Lab Results  Component Value Date   HGBA1C 6.7 (A) 08/08/2023   MPG 205.86 10/25/2022   MPG 294.83 05/31/2017   No results found for: PROLACTIN Lab Results  Component Value Date   CHOL 151 11/11/2021   TRIG 251 (H) 11/11/2021   HDL 34 (L) 11/11/2021   CHOLHDL 4.4 11/11/2021   VLDL 26 12/09/2012   LDLCALC 76 11/11/2021   LDLCALC 153 (H) 08/17/2020   Lab Results  Component Value Date   TSH 1.890 08/17/2020   TSH 4.120 06/10/2018    Therapeutic Level Labs: No results found for: LITHIUM No results found for: VALPROATE No results found for: CBMZ  Current Medications: Current Outpatient Medications  Medication Sig Dispense Refill   Accu-Chek Softclix Lancets lancets Use 1 lancet to check glucose once daily and as needed for diabetes 100 each 12   acetaminophen  (TYLENOL ) 500 MG tablet Take 2 tablets (1,000 mg total) by mouth 4 (four) times daily. 30 tablet 0   albuterol  (VENTOLIN  HFA) 108 (90 Base) MCG/ACT inhaler INHALE 2 PUFFS INTO THE LUNGS EVERY 6 HOURS AS MEEDED FOR WHEEZING OR SHORTNESS OF BREATH 18 g 1   aspirin  EC 81 MG tablet  Take 81 mg by mouth daily at 3 pm.      atorvastatin  (LIPITOR)  40 MG tablet Take 1 tablet (40 mg total) by mouth daily. 90 tablet 3   Blood Glucose Monitoring Suppl (ACCU-CHEK GUIDE ME) w/Device KIT Use as directed. E11.65 1 kit 0   BREZTRI  AEROSPHERE 160-9-4.8 MCG/ACT AERO inhaler INHALE 2 PUFFS TWICE DAILY 11 g 0   buPROPion  (WELLBUTRIN  XL) 150 MG 24 hr tablet Take 1 tablet (150 mg total) by mouth daily. Take along with 300 mg daily 90 tablet 3   buPROPion  (WELLBUTRIN  XL) 300 MG 24 hr tablet Take 1 tablet (300 mg total) by mouth daily. Take along with 150 mg daily 90 tablet 3   Cholecalciferol (VITAMIN D3) 125 MCG (5000 UT) CAPS Take 1 capsule (5,000 Units total) by mouth daily with breakfast. Take along with calcium  and magnesium. 90 capsule 0   Continuous Glucose Sensor (DEXCOM G7 SENSOR) MISC Use one every 10 days for uncontrolled diabetes E11.65 3 each 11   Cyanocobalamin (VITAMIN B-12) 5000 MCG SUBL Place 1 tablet (5,000 mcg total) under the tongue daily. 30 tablet 0   cyclobenzaprine  (FLEXERIL ) 10 MG tablet Take 0.5-1 tablets (5-10 mg total) by mouth 3 (three) times daily as needed for muscle spasms. 60 tablet 2   dapagliflozin  propanediol (FARXIGA ) 10 MG TABS tablet Take 1 tablet (10 mg total) by mouth daily before breakfast. 90 tablet 2   DULoxetine  (CYMBALTA ) 30 MG capsule Take 1 capsule (30 mg total) by mouth daily. Take along with 60 mg daily , total of 90 mg daily 90 capsule 3   DULoxetine  (CYMBALTA ) 60 MG capsule Take 1 capsule (60 mg total) by mouth daily. Take along with 30 mg daily 90 capsule 3   ergocalciferol  (VITAMIN D2) 1.25 MG (50000 UT) capsule Take 1 capsule (50,000 Units total) by mouth 2 (two) times a week. X 6 weeks. 12 capsule 0   ezetimibe  (ZETIA ) 10 MG tablet Take 1 tablet by mouth once daily 90 tablet 0   famotidine  (PEPCID ) 20 MG tablet Take 1 tablet by mouth twice daily 180 tablet 0   glucose blood (ACCU-CHEK GUIDE) test strip Use 1 test strip to check fasting  glucose once daily and as need for diabetes 100 each 12   insulin  glargine, 2 Unit Dial , (TOUJEO  MAX SOLOSTAR) 300 UNIT/ML Solostar Pen Inject 50 Units into the skin daily. 15 mL 3   insulin  lispro (HUMALOG  KWIKPEN) 200 UNIT/ML KwikPen INJECT INSULIN  INTO THE SKIN WITH MEALS THREE TIMES DAILY PER SLIDING SCALE PROVIDED. MAX DAILY DOSE IS 42 UNITS 6 mL 3   Insulin  Pen Needle 32G X 4 MM MISC Use with daily dose insulin  and with sliding scale insulin  as needed. 100 each 5   Melatonin 10 MG CAPS Take 30 mg by mouth at bedtime.     meloxicam  (MOBIC ) 15 MG tablet Take 15 mg by mouth daily.     metoprolol  tartrate (LOPRESSOR ) 100 MG tablet Take 1 tablet by mouth twice daily 180 tablet 0   nitroGLYCERIN  (NITROSTAT ) 0.4 MG SL tablet Place 1 tablet (0.4 mg total) under the tongue every 5 (five) minutes x 3 doses as needed for chest pain. 30 tablet 1   oxyCODONE  (OXY IR/ROXICODONE ) 5 MG immediate release tablet Take 1 tablet (5 mg total) by mouth 2 (two) times daily as needed for severe pain (pain score 7-10). 40 tablet 0   pantoprazole  (PROTONIX ) 40 MG tablet Take 1 tablet by mouth once daily 90 tablet 0   pregabalin  (LYRICA ) 50 MG capsule Take 1 capsule (50 mg total) by mouth 3 (  three) times daily. For 7 days then increase to 2 capsules PO 3 times daily 159 capsule 0   rOPINIRole  (REQUIP ) 0.25 MG tablet Take 1 tablet (0.25 mg total) by mouth 3 (three) times daily. 90 tablet 1   Semaglutide , 1 MG/DOSE, (OZEMPIC , 1 MG/DOSE,) 4 MG/3ML SOPN INJECT 1MG  SUBCUTANEOUSLY ONCE WEEKLY 9 mL 3   tamsulosin  (FLOMAX ) 0.4 MG CAPS capsule Take 1 capsule by mouth once daily 90 capsule 0   temazepam  (RESTORIL ) 30 MG capsule Take 1 capsule (30 mg total) by mouth at bedtime as needed for sleep. 30 capsule 5   varenicline  (CHANTIX ) 1 MG tablet Take 1 tablet by mouth twice daily 60 tablet 3   No current facility-administered medications for this visit.     Musculoskeletal: Strength & Muscle Tone: UTA Gait & Station:  Seated Patient leans: N/A  Psychiatric Specialty Exam: Review of Systems  Psychiatric/Behavioral: Negative.      There were no vitals taken for this visit.There is no height or weight on file to calculate BMI.  General Appearance: Fairly Groomed  Eye Contact:  Fair  Speech:  Clear and Coherent  Volume:  Normal  Mood:  Euthymic  Affect:  Congruent  Thought Process:  Goal Directed and Descriptions of Associations: Intact  Orientation:  Full (Time, Place, and Person)  Thought Content: Logical   Suicidal Thoughts:  No  Homicidal Thoughts:  No  Memory:  Immediate;   Fair Recent;   Fair Remote;   Fair  Judgement:  Fair  Insight:  Fair  Psychomotor Activity:  Normal  Concentration:  Concentration: Fair and Attention Span: Fair  Recall:  [employer] of Knowledge: Fair  Language: Fair  Akathisia:  No  Handed:  Right  AIMS (if indicated): not done  Assets:  Manufacturing systems engineer Desire for Improvement Housing Social Support Transportation  ADL's:  Intact  Cognition: WNL  Sleep:  Fair   Screenings: GAD-7    Garment/textile technologist Visit from 10/30/2023 in [facility] Office Visit from 02/22/2023 in [facility] Office Visit from 02/09/2022 in [facility] Video Visit from 12/20/2021 in [facility] Video Visit from 06/20/2021 in [facility]  Total GAD-7 Score 5 5 7 6 8    Mini-Mental    Flowsheet Row Office Visit from 10/24/2023 in [facility] Office Visit from 10/23/2022 in [facility] Office Visit from 10/19/2021 in [facility] Clinical Support from 10/18/2020 in [facility] Clinical Support from 10/17/2019 in [facility]  Total Score (max 30 points ) 27 30 30 29 30    PHQ2-9    Flowsheet Row Procedure  visit from 12/18/2023 in [facility] Visit from 11/28/2023 in [facility] at [facility] Visit from 11/15/2023 in [facility] Dept Of [place] Office Visit from 11/07/2023 in [facility] at [facility] Visit from 10/30/2023 in [facility]  PHQ-2 Total Score 1 0 0 0 1   Flowsheet Row Video Visit from 12/31/2023 in [facility] Office Visit from 11/15/2023 in [facility] Dept Of [place] Office Visit from 10/30/2023 in [facility]  C-SSRS RISK CATEGORY Moderate Risk No Risk No Risk     Assessment and Plan: ZAKAI GONYEA is a 58 year old Caucasian male, lives in [place], has a history of MDD, insomnia, adenocarcinoma of right lung status post lung segmentectomy was evaluated by telemedicine today.  Discussed assessment and plan as noted below.  MDD in remission Other specified anxiety disorder-generalized anxiety not occurring more days than not-stable Posttraumatic stress disorder-stable Currently reports overall mood symptoms are stable. Continue Cymbalta  90 mg daily Continue Wellbutrin  XL 450 mg daily Continue Hydroxyzine  25 to 50 mg at bedtime as needed  Insomnia-improving Currently reports sleep is overall good. Continue CPAP for OSA (sleep study 01/05/2021- periodic limb movement) Continue Requip  0.25 mg 3 times a day Continue Temazepam  30 mg at bedtime Reviewed [place] AWARxE  Tobacco use disorder-unstable Currently smokes half a pack although compliant on Chantix . Provided counseling for 1 minute. Continue Chantix  1 mg twice daily Encouraged to continue nicotine  replacement if  helpful.   Follow-up Follow-up in clinic in 3 to 4 months or sooner if needed.    Consent: Patient/Guardian gives verbal consent for treatment and assignment of benefits for services provided during this visit. Patient/Guardian expressed understanding and agreed to proceed.   This note was generated in part or whole with voice recognition software. Voice recognition is usually quite accurate but there are transcription errors that can and very often do occur. I apologize for any typographical errors that were not detected and corrected.    Luan Maberry, MD 12/31/2023, 2:40 PM

## 2024-01-01 ENCOUNTER — Other Ambulatory Visit: Payer: Self-pay | Admitting: Psychiatry

## 2024-01-01 DIAGNOSIS — G4701 Insomnia due to medical condition: Secondary | ICD-10-CM

## 2024-01-01 NOTE — Progress Notes (Unsigned)
 PROVIDER NOTE: Interpretation of information contained herein should be left to medically-trained personnel. Specific patient instructions are provided elsewhere under Patient Instructions section of medical record. This document was created in part using AI and STT-dictation technology, any transcriptional errors that may result from this process are unintentional.  Patient: Raymond [name]  Service: E/M   PCP: Liana Fish, NP  DOB: [birth year]  DOS: 01/02/2024  Provider: Eric DELENA Como, MD  MRN: [id number]  Delivery: Face-to-face  Specialty: Interventional Pain Management  Type: Established Patient  Setting: Ambulatory outpatient facility  Specialty designation: 09  Referring Prov.: Liana Fish, NP  Location: Outpatient office facility       History of present illness (HPI) Raymond [name], a 58 y.o. year old male, is here today because of his Chronic right-sided low back pain without sciatica [M54.50, G89.29]. Raymond [name] primary complain today is No chief complaint on file.  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; Left upper quadrant abdominal pain of unknown etiology; Generalized abdominal pain; Right upper quadrant pain; Primary adenocarcinoma of right lung (HCC); Diabetic polyneuropathy associated with type 2 diabetes mellitus (HCC); Lumbar radiculopathy, chronic; Spinal stenosis of lumbar region with neurogenic claudication; Chronic pain syndrome; History of lung cancer (Right); Chronic low back pain (Midline) w/o sciatica; Chronic low back pain (1ry area of Pain) (Right) w/o sciatica; Low back pain of over 3 months duration; Low back pain potentially associated with radiculopathy; Low back pain radiating to right leg; Multifactorial low back pain; Chronic hip pain (3ry area of Pain) (Right); Chronic sacroiliac joint pain (Right); Somatic dysfunction of sacroiliac joint (Right); Lumbar facet joint pain;  Chronic neck pain (4th area of Pain) (Right); Cervicalgia; Cervicogenic headache (5th area of Pain) (Right); Grade 1 Retrolisthesis of C3/C4; Grade 1 Anterolisthesis of cervical spine (C5/C6); Chronic lower extremity pain (2ry area of Pain) (Right); Cervical facet joint pain; Osteoarthritis of sacroiliac joint (HCC) (Right); and Sacroiliac joint dysfunction (Right) on their pertinent problem list.  Pain Assessment: Severity of   is reported as a  /10. Location:    / . Onset:  . Quality:  . Timing:  . Modifying factor(s):  SABRA Vitals:  vitals were not taken for this visit.  BMI: Estimated body mass index is 30.86 kg/m as calculated from the following:   Height as of 12/18/23: 5' 9 (1.753 m).   Weight as of 12/18/23: 209 lb (94.8 kg).  Last encounter: 11/28/2023. Last procedure: 12/18/2023.  Reason for encounter: post-procedure evaluation and assessment.   Discussed the use of AI scribe software for clinical note transcription with the patient, who gave verbal consent to proceed.  History of Present Illness          Post-Procedure Evaluation   Procedure: Sacroiliac Joint Steroid Injection #1    Laterality: Right     Level: PSIS (Posterior Superior Iliac Spine)  Target: Interarticular sacroiliac joint. Location: Medial to the postero-medial edge of iliac spine. Region: Lumbosacral-sacrococcygeal. Approach: Superior & inferior postero-medial percutaneous approach. Type of procedure: Percutaneous joint injection.  Imaging: Fluoroscopy-guided Non-spinal ([id number]) Anesthesia: Local anesthesia (1-2% Lidocaine ) Anxiolysis: IV Versed  2.0 mg Sedation: Minimal Sedation None required. No Fentanyl  administered.         DOS: 12/18/2023  Performed by: Eric DELENA Como, MD  Purpose: Diagnostic/Therapeutic Indications: Sacroiliac joint pain in the lower back and hip area severe enough to impact quality of life or function. Rationale (medical necessity): procedure needed and proper for the  diagnosis and/or  treatment of Raymond [name] medical symptoms and needs. 1. Chronic low back pain (1ry area of Pain) (Right) w/o sciatica   2. Chronic sacroiliac joint pain (Right)   3. Low back pain of over 3 months duration   4. Somatic dysfunction of sacroiliac joint (Right)   5. Osteoarthritis of sacroiliac joint (HCC) (Right)   6. Sacroiliac joint dysfunction (Right)    NAS-11 Pain score:   Pre-procedure: 6 /10   Post-procedure: 0-No pain/10     Effectiveness:  Initial hour after procedure:   ***. Subsequent 4-6 hours post-procedure:   ***. Analgesia past initial 6 hours:   ***. Ongoing improvement:  Analgesic:  *** Function:    ***    ROM:    ***     Pharmacotherapy Assessment   Opioid Analgesic: None MME/day: 0 mg/day   Monitoring: [place] PMP: PDMP reviewed during this encounter.       Pharmacotherapy: No side-effects or adverse reactions reported. Compliance: No problems identified. Effectiveness: Clinically acceptable.  No notes on file  UDS:  Summary  Date Value Ref Range Status  11/07/2023 FINAL  Final    Comment:    ==================================================================== Compliance Drug Analysis, Ur ==================================================================== Test                             Result       Flag       Units  Drug Present and Declared for Prescription Verification   Oxazepam                       658          EXPECTED   ng/mg creat   Temazepam                       >3030        EXPECTED   ng/mg creat    Oxazepam and temazepam  are expected metabolites of diazepam.    Oxazepam is also an expected metabolite of other benzodiazepine    drugs, including chlordiazepoxide, prazepam, clorazepate, halazepam,    and temazepam .  Oxazepam and temazepam  are available as scheduled    prescription medications.    Cyclobenzaprine                 PRESENT      EXPECTED   Desmethylcyclobenzaprine       PRESENT      EXPECTED     Desmethylcyclobenzaprine is an expected metabolite of    cyclobenzaprine .    Metoprolol                      PRESENT      EXPECTED  Drug Present not Declared for Prescription Verification   Gabapentin                      PRESENT      UNEXPECTED  Drug Absent but Declared for Prescription Verification   Oxycodone                       Not Detected UNEXPECTED ng/mg creat   Pregabalin                      Not Detected UNEXPECTED   Bupropion                       Not Detected UNEXPECTED  Duloxetine                      Not Detected UNEXPECTED   Acetaminophen                   Not Detected UNEXPECTED    Acetaminophen , as indicated in the declared medication list, is not    always detected even when used as directed.    Salicylate                     Not Detected UNEXPECTED    Aspirin , as indicated in the declared medication list, is not always    detected even when used as directed.  ==================================================================== Test                      Result    Flag   Units      Ref Range   Creatinine              66               mg/dL      >=79 ==================================================================== Declared Medications:  The flagging and interpretation on this report are based on the  following declared medications.  Unexpected results may arise from  inaccuracies in the declared medications.   **Note: The testing scope of this panel includes these medications:   Bupropion  (Wellbutrin  XL)  Cyclobenzaprine  (Flexeril )  Duloxetine  (Cymbalta )  Metoprolol  (Lopressor )  Oxycodone  (Roxicodone )  Pregabalin  (Lyrica )  Temazepam  (Restoril )   **Note: The testing scope of this panel does not include small to  moderate amounts of these reported medications:   Acetaminophen  (Tylenol )  Aspirin    **Note: The testing scope of this panel does not include the  following reported medications:   Albuterol  (Ventolin  HFA)  Atorvastatin  (Lipitor)  Budesonide  (Breztri  Aerosphere)  Dapagliflozin  (Farxiga )  Ezetimibe  (Zetia )  Famotidine  (Pepcid )  Formoterol  (Breztri  Aerosphere)  Glycopyrrolate  (Breztri  Aerosphere)  Insulin  (Toujeo )  Melatonin  Meloxicam  (Mobic )  Nitroglycerin  (Nitrostat )  Pantoprazole  (Protonix )  Ropinirole  (Requip )  Semaglutide  (Ozempic )  Tamsulosin  (Flomax )  Varenicline  (Chantix ) ==================================================================== For clinical consultation, please call [phone]. ====================================================================     No results found for: CBDTHCR No results found for: D8THCCBX No results found for: D9THCCBX  ROS  Constitutional: Denies any fever or chills Gastrointestinal: No reported hemesis, hematochezia, vomiting, or acute GI distress Musculoskeletal: Denies any acute onset joint swelling, redness, loss of ROM, or weakness Neurological: No reported episodes of acute onset apraxia, aphasia, dysarthria, agnosia, amnesia, paralysis, loss of coordination, or loss of consciousness  Medication Review  Accu-Chek Guide Me, Accu-Chek Softclix Lancets, DULoxetine , Dexcom G7 Sensor, Insulin  Pen Needle, Melatonin, Semaglutide  (1 MG/DOSE), Vitamin B-12, Vitamin D3, acetaminophen , albuterol , aspirin  EC, atorvastatin , buPROPion , budesonide-glycopyrrolate -formoterol , cyclobenzaprine , dapagliflozin  propanediol, ergocalciferol , ezetimibe , famotidine , glucose blood, insulin  glargine (2 Unit Dial ), insulin  lispro, meloxicam , metoprolol  tartrate, nitroGLYCERIN , oxyCODONE , pantoprazole , pregabalin , rOPINIRole , tamsulosin , temazepam , and varenicline   History Review  Allergy: Raymond [name] is allergic to onion, norco [hydrocodone-acetaminophen ], nickel, and nicoderm [nicotine ]. Drug: Raymond [name]  reports no history of drug use. Alcohol:  reports no history of alcohol use. Tobacco:  reports that he has been smoking cigarettes. He has a 10 pack-year smoking history. He has quit  using smokeless tobacco.  His smokeless tobacco use included chew. Social: Raymond [name]  reports that he has been smoking cigarettes. He has a 10 pack-year smoking history. He has quit using smokeless tobacco.  His smokeless tobacco use included chew. He reports  that he does not drink alcohol and does not use drugs. 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 Surgical: Raymond [name]  has a past surgical history that includes Shoulder surgery (Right); Clavicle surgery (Right);  ingrown toenail removal; Colonoscopy (2017); Upper gi endoscopy (2017); Mouth surgery; Cholecystectomy (N/A, 01/23/2019); Esophagogastroduodenoscopy (egd) with propofol  (N/A, 08/25/2020); Colonoscopy with propofol  (N/A, 08/25/2020); Xi robotic assisted thoracoscopy- segmentectomy (Right, 10/27/2022); Lymph node dissection (Right, 10/27/2022); IR THORACENTESIS ASP PLEURAL SPACE W/IMG GUIDE (11/20/2022); and Lung lobectomy (Right, 2024). Family: family history includes Anxiety disorder in his brother, sister, sister, sister, and sister; COPD in his father; Colon cancer in his maternal uncle; Depression in his brother, sister, sister, sister, and sister; Diabetes in his brother, maternal aunt, mother, sister, sister, sister, and sister; Diabetes type II in his mother; Heart disease in his father; Hypertension in his mother; Lung cancer in his father.  Laboratory Chemistry Profile   Renal Lab Results  Component Value Date   BUN 19 11/15/2023    CREATININE 1.04 11/15/2023   BCR 14 11/07/2023   GFRAA >60 01/25/2019   GFRNONAA >60 11/15/2023    Hepatic Lab Results  Component Value Date   AST 17 11/15/2023   ALT 19 11/15/2023   ALBUMIN  4.2 11/15/2023   ALKPHOS 121 11/15/2023   LIPASE 35 09/15/2023    Electrolytes Lab Results  Component Value Date   NA 136 11/15/2023   K 4.6 11/15/2023   CL 103 11/15/2023   CALCIUM  9.6 11/15/2023   MG 2.1 11/07/2023    Bone Lab Results  Component Value Date   VD25OH 24.5 (L) 11/11/2021   25OHVITD1 28 (L) 11/07/2023   25OHVITD2 <1.0 11/07/2023   25OHVITD3 28 11/07/2023    Inflammation (CRP: Acute Phase) (ESR: Chronic Phase) Lab Results  Component Value Date   CRP 3 11/07/2023   ESRSEDRATE 12 11/07/2023   LATICACIDVEN 2.1 (HH) 05/31/2017         Note: Above Lab results reviewed.  Recent Imaging Review  DG PAIN CLINIC C-ARM 1-60 MIN NO REPORT Fluoro was used, but no Radiologist interpretation will be provided.  Please refer to NOTES tab for provider progress note. Note: Reviewed        Physical Exam  Vitals: There were no vitals taken for this visit. BMI: Estimated body mass index is 30.86 kg/m as calculated from the following:   Height as of 12/18/23: 5' 9 (1.753 m).   Weight as of 12/18/23: 209 lb (94.8 kg). Ideal: Ideal body weight: 70.7 kg (155 lb 13.8 oz) Adjusted ideal body weight: 80.3 kg (177 lb 1.9 oz) General appearance: Well nourished, well developed, and well hydrated. In no apparent acute distress Mental status: Alert, oriented x 3 (person, place, & time)       Respiratory: No evidence of acute respiratory distress Eyes: PERLA   Assessment   Diagnosis Status  1. Chronic low back pain (1ry area of Pain) (Right) w/o sciatica   2. Chronic sacroiliac joint pain (Right)   3. Low back pain of over 3 months duration   4. Chronic hip pain (3ry area of Pain) (Right)   5. Chronic lower extremity pain (2ry  area of Pain) (Right)     Controlled Controlled Controlled   Updated Problems: No problems updated.  Plan of Care  Problem-specific:  Assessment and Plan            Raymond [name] has a current medication list which includes the following long-term medication(s): albuterol , atorvastatin , bupropion , bupropion , vitamin b-12, duloxetine , duloxetine , ezetimibe , famotidine , toujeo  max solostar, humalog  kwikpen, metoprolol  tartrate, nitroglycerin , pantoprazole , pregabalin , ropinirole , and temazepam .  Pharmacotherapy (Medications Ordered): No orders of the defined types were placed in this encounter.  Orders:  No orders of the defined types were placed in this encounter.    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 (Local anesthetic[LA] +/- sedation  L.A.Local Anesthetic  Steroid benefit  Ongoing benefit)      No follow-ups on file.    Recent Visits Date Type Provider Dept  12/18/23 Procedure visit Tanya Glisson, MD [facility]  11/28/23 Office Visit Tanya Glisson, MD [facility]  11/07/23 Office Visit Tanya Glisson, MD [facility]  Showing recent visits within past 90 days and meeting all other requirements Future Appointments Date Type Provider Dept  01/02/24 Appointment Tanya Glisson, MD [facility]  Showing future appointments within next 90 days and meeting all other requirements  I discussed the assessment and  treatment plan with the patient. The patient was provided an opportunity to ask questions and all were answered. The patient agreed with the plan and demonstrated an understanding of the instructions.  Patient advised to call back or seek an in-person evaluation if the symptoms or condition worsens.  Duration of encounter: *** minutes.  Total time on encounter, as per AMA guidelines included both the face-to-face and non-face-to-face time personally spent by the physician and/or other qualified health care professional(s) on the day of the encounter (includes time in activities that require the physician or other qualified health care professional and does not include time in activities normally performed by clinical staff). Physician's time may include the following activities when performed: Preparing to see the patient (e.g., pre-charting review of records, searching for previously ordered imaging, lab work, and nerve conduction tests) Review of prior analgesic pharmacotherapies. Reviewing PMP Interpreting ordered tests (e.g., lab work, imaging, nerve conduction tests) Performing post-procedure evaluations, including interpretation of diagnostic procedures Obtaining and/or reviewing separately obtained history Performing a medically appropriate examination and/or evaluation Counseling and educating the patient/family/caregiver Ordering medications, tests, or procedures Referring and communicating with other health care professionals (when not separately reported) Documenting clinical information in the electronic or other health record Independently interpreting results (not separately reported) and communicating results to the patient/ family/caregiver Care coordination (not separately reported)  Note by: Glisson DELENA Tanya, MD (TTS and AI technology used. I apologize for any typographical errors that were not detected and corrected.) Date: 01/02/2024; Time: 6:42 AM

## 2024-01-02 ENCOUNTER — Ambulatory Visit: Attending: Pain Medicine | Admitting: Pain Medicine

## 2024-01-02 ENCOUNTER — Encounter: Payer: Self-pay | Admitting: Pain Medicine

## 2024-01-02 VITALS — BP 120/79 | HR 95 | Temp 97.7°F | Ht 69.0 in | Wt 209.0 lb

## 2024-01-02 DIAGNOSIS — M533 Sacrococcygeal disorders, not elsewhere classified: Secondary | ICD-10-CM | POA: Insufficient documentation

## 2024-01-02 DIAGNOSIS — M545 Low back pain, unspecified: Secondary | ICD-10-CM | POA: Insufficient documentation

## 2024-01-02 DIAGNOSIS — M47816 Spondylosis without myelopathy or radiculopathy, lumbar region: Secondary | ICD-10-CM | POA: Insufficient documentation

## 2024-01-02 DIAGNOSIS — M47817 Spondylosis without myelopathy or radiculopathy, lumbosacral region: Secondary | ICD-10-CM | POA: Diagnosis not present

## 2024-01-02 DIAGNOSIS — M25551 Pain in right hip: Secondary | ICD-10-CM | POA: Insufficient documentation

## 2024-01-02 DIAGNOSIS — M5459 Other low back pain: Secondary | ICD-10-CM | POA: Diagnosis not present

## 2024-01-02 DIAGNOSIS — G8929 Other chronic pain: Secondary | ICD-10-CM | POA: Insufficient documentation

## 2024-01-02 NOTE — Progress Notes (Signed)
 Safety precautions to be maintained throughout the outpatient stay will include: orient to surroundings, keep bed in low position, maintain call bell within reach at all times, provide assistance with transfer out of bed and ambulation.

## 2024-01-02 NOTE — Patient Instructions (Addendum)
 Facet Joint Block The facet joints connect the bones of the spine (vertebrae). They let you bend, twist, and make other movements with your spine. They also keep you from bending too far, twisting too far, and making other extreme movements. A facet joint block is a procedure where a numbing medicine (local anesthesia) is injected into a facet joint. Many times, a medicine for inflammation (steroid) is also injected. A facet joint block may be done: To diagnose neck or back pain. If the pain gets better after a facet joint block, the pain is likely coming from the facet joint. If the pain does not get better, the pain is likely not coming from the facet joint. To treat neck or back pain caused by an inflamed facet joint. To help you with physical therapy or other rehab (rehabilitation) exercises. Tell a health care provider about: Any allergies you have. All medicines you are taking, including vitamins, herbs, eye drops, creams, and over-the-counter medicines. Any problems you or family members have had with anesthesia. Any bleeding problems you have. Any surgeries you have had. Any medical conditions you have or have had. Whether you are pregnant or may be pregnant. What are the risks? Your health care provider will talk with you about risks. These may include: Infection. Allergic reactions to medicines or dyes. Bleeding. Injury to a nerve near where the needle was put in (injection site). Pain at the injection site. Short-term weakness or numbness in areas near the nerves at the injection site. What happens before the procedure? When to stop eating and drinking Follow instructions from your health care provider about what you may eat and drink. Medicines Ask your health care provider about: Changing or stopping your regular medicines. These include any diabetes medicines or blood thinners you take. Taking medicines such as aspirin  and ibuprofen . These medicines can thin your blood. Do  not take these medicines unless your health care provider tells you to. Taking over-the-counter medicines, vitamins, herbs, and supplements. General instructions If you will be going home right after the procedure, plan to have a responsible adult: Take you home from the hospital or clinic. You will not be allowed to drive. Care for you for the time you are told. Ask your health care provider: How your injection site will be marked. What steps will be taken to help prevent infection. These may include washing skin with a soap that kills germs. What happens during the procedure?  An IV will be inserted into one of your veins. You will lie on your stomach on an X-ray table. You may be asked to lie in a different position if you will be getting an injection in your neck. Your injection site will be cleaned with a soap that kills germs and then covered with a germ-free (sterile) drape. A local anesthesia will be put in at the injection site. A type of X-ray machine (fluoroscopy) or CT scan will be used to help find your facet joint. A contrast dye may also be injected into your joint to help show if the needle is at the joint. When your provider knows the needle is at your joint, they will inject anesthesia and anti-inflammatory medicine as needed. The needle will be removed. Pressure will be applied to keep your injection site from bleeding. A bandage (dressing) will be placed over each injection site. The procedure may vary among health care providers and hospitals. What happens after the procedure? Your blood pressure, heart rate, breathing rate, and blood oxygen level  will be monitored until you leave the hospital or clinic. This information is not intended to replace advice given to you by your health care provider. Make sure you discuss any questions you have with your health care provider. Document Revised: 10/21/2021 Document Reviewed: 10/21/2021 Elsevier Patient Education  2024  [employer] ______________________________________________________________________    Procedure instructions  Stop blood-thinners  Do not eat or drink fluids (other than water) for 6 hours before your procedure  No water for 2 hours before your procedure  Take your blood pressure medicine with a sip of water  Arrive 30 minutes before your appointment  If sedation is planned, bring suitable driver. [place], [place], & public transportation are NOT APPROVED)  Carefully read the Preparing for your procedure detailed instructions  If you have questions call us  at [phone]  Procedure appointments are for procedures only.   NO medication refills or new problem evaluations will be done on procedure days.   Only the scheduled, pre-approved procedure and side will be done.   ______________________________________________________________________     ______________________________________________________________________    Preparing for your procedure  Appointments: If you think you may not be able to keep your appointment, call 24-48 hours in advance to cancel. We need time to make it available to others.  Procedure visits are for procedures only. During your procedure appointment there will be: NO Prescription Refills*. NO medication changes or discussions*. NO discussion of disability issues*. NO unrelated pain problem evaluations*. NO evaluations to order other pain procedures*. *These will be addressed at a separate and distinct evaluation encounter on the provider's evaluation schedule and not during procedure days.  Instructions: Food intake: Avoid eating anything solid for at least 8 hours prior to your procedure. Clear liquid intake: You may take clear liquids such as water up to 2 hours prior to your procedure. (No carbonated drinks. No soda.) Transportation: Unless otherwise stated by your physician, bring a driver. (Driver cannot be a Market researcher, Pharmacist, community, or any other  form of public transportation.) Morning Medicines: Except for blood thinners, take all of your other morning medications with a sip of water. Make sure to take your heart and blood pressure medicines. If your blood pressure's lower number is above 100, the case will be rescheduled. Blood thinners: Make sure to stop your blood thinners as instructed.  If you take a blood thinner, but were not instructed to stop it, call our office [phone] and ask to talk to a nurse. Not stopping a blood thinner prior to certain procedures could lead to serious complications. Diabetics on insulin : Notify the staff so that you can be scheduled 1st case in the morning. If your diabetes requires high dose insulin , take only  of your normal insulin  dose the morning of the procedure and notify the staff that you have done so. Preventing infections: Shower with an antibacterial soap the morning of your procedure.  Build-up your immune system: Take 1000 mg of Vitamin C with every meal (3 times a day) the day prior to your procedure. Antibiotics: Inform the nursing staff if you are taking any antibiotics or if you have any conditions that may require antibiotics prior to procedures. (Example: recent joint implants)   Pregnancy: If you are pregnant make sure to notify the nursing staff. Not doing so may result in injury to the fetus, including death.  Sickness: If you have a cold, fever, or any active infections, call and cancel or reschedule your procedure. Receiving steroids while having an infection may  result in complications. Arrival: You must be in the facility at least 30 minutes prior to your scheduled procedure. Tardiness: Your scheduled time is also the cutoff time. If you do not arrive at least 15 minutes prior to your procedure, you will be rescheduled.  Children: Do not bring any children with you. Make arrangements to keep them home. Dress appropriately: There is always a possibility that your clothing may get  soiled. Avoid long dresses. Valuables: Do not bring any jewelry or valuables.  Reasons to call and reschedule or cancel your procedure: (Following these recommendations will minimize the risk of a serious complication.) Surgeries: Avoid having procedures within 2 weeks of any surgery. (Avoid for 2 weeks before or after any surgery). Flu Shots: Avoid having procedures within 2 weeks of a flu shots or . (Avoid for 2 weeks before or after immunizations). Barium: Avoid having a procedure within 7-10 days after having had a radiological study involving the use of radiological contrast. (Myelograms, Barium swallow or enema study). Heart attacks: Avoid any elective procedures or surgeries for the initial 6 months after a Myocardial Infarction (Heart Attack). Blood thinners: It is imperative that you stop these medications before procedures. Let us  know if you if you take any blood thinner.  Infection: Avoid procedures during or within two weeks of an infection (including chest colds or gastrointestinal problems). Symptoms associated with infections include: Localized redness, fever, chills, night sweats or profuse sweating, burning sensation when voiding, cough, congestion, stuffiness, runny nose, sore throat, diarrhea, nausea, vomiting, cold or Flu symptoms, recent or current infections. It is specially important if the infection is over the area that we intend to treat. Heart and lung problems: Symptoms that may suggest an active cardiopulmonary problem include: cough, chest pain, breathing difficulties or shortness of breath, dizziness, ankle swelling, uncontrolled high or unusually low blood pressure, and/or palpitations. If you are experiencing any of these symptoms, cancel your procedure and contact your primary care physician for an evaluation.  Remember:  Regular Business hours are:  Monday to Thursday 8:00 AM to 4:00 PM  Provider's Schedule: Eric Como, MD:  Procedure days: Tuesday and  Thursday 7:30 AM to 4:00 PM  [name] Sherry, MD:  Procedure days: Monday and Wednesday 7:30 AM to 4:00 PM Last  Updated: 04/03/2023 ______________________________________________________________________     ______________________________________________________________________    General Risks and Possible Complications  Patient Responsibilities: It is important that you read this as it is part of your informed consent. It is our duty to inform you of the risks and possible complications associated with treatments offered to you. It is your responsibility as a patient to read this and to ask questions about anything that is not clear or that you believe was not covered in this document.  Patient's Rights: You have the right to refuse treatment. You also have the right to change your mind, even after initially having agreed to have the treatment done. However, under this last option, if you wait until the last second to change your mind, you may be charged for the materials used up to that point.  Introduction: Medicine is not an Visual merchandiser. Everything in Medicine, including the lack of treatment(s), carries the potential for danger, harm, or loss (which is by definition: Risk). In Medicine, a complication is a secondary problem, condition, or disease that can aggravate an already existing one. All treatments carry the risk of possible complications. The fact that a side effects or complications occurs, does not imply that the treatment  was conducted incorrectly. It must be clearly understood that these can happen even when everything is done following the highest safety standards.  No treatment: You can choose not to proceed with the proposed treatment alternative. The "PRO(s)" would include: avoiding the risk of complications associated with the therapy. The "CON(s)" would include: not getting any of the treatment benefits. These benefits fall under one of three categories: diagnostic;  therapeutic; and/or palliative. Diagnostic benefits include: getting information which can ultimately lead to improvement of the disease or symptom(s). Therapeutic benefits are those associated with the successful treatment of the disease. Finally, palliative benefits are those related to the decrease of the primary symptoms, without necessarily curing the condition (example: decreasing the pain from a flare-up of a chronic condition, such as incurable terminal cancer).  General Risks and Complications: These are associated to most interventional treatments. They can occur alone, or in combination. They fall under one of the following six (6) categories: no benefit or worsening of symptoms; bleeding; infection; nerve damage; allergic reactions; and/or death. No benefits or worsening of symptoms: In Medicine there are no guarantees, only probabilities. No healthcare provider can ever guarantee that a medical treatment will work, they can only state the probability that it may. Furthermore, there is always the possibility that the condition may worsen, either directly, or indirectly, as a consequence of the treatment. Bleeding: This is more common if the patient is taking a blood thinner, either prescription or over the counter (example: Goody Powders, Fish oil, Aspirin , Garlic, etc.), or if suffering a condition associated with impaired coagulation (example: Hemophilia, cirrhosis of the liver, low platelet counts, etc.). However, even if you do not have one on these, it can still happen. If you have any of these conditions, or take one of these drugs, make sure to notify your treating physician. Infection: This is more common in patients with a compromised immune system, either due to disease (example: diabetes, cancer, human immunodeficiency virus [HIV], etc.), or due to medications or treatments (example: therapies used to treat cancer and rheumatological diseases). However, even if you do not have one on  these, it can still happen. If you have any of these conditions, or take one of these drugs, make sure to notify your treating physician. Nerve Damage: This is more common when the treatment is an invasive one, but it can also happen with the use of medications, such as those used in the treatment of cancer. The damage can occur to small secondary nerves, or to large primary ones, such as those in the spinal cord and brain. This damage may be temporary or permanent and it may lead to impairments that can range from temporary numbness to permanent paralysis and/or brain death. Allergic Reactions: Any time a substance or material comes in contact with our body, there is the possibility of an allergic reaction. These can range from a mild skin rash (contact dermatitis) to a severe systemic reaction (anaphylactic reaction), which can result in death. Death: In general, any medical intervention can result in death, most of the time due to an unforeseen complication. ______________________________________________________________________      ______________________________________________________________________    Steroid injections  Common steroids for injections Triamcinolone : Used by many sports medicine physicians for large joint and bursal injections, often combined with a local anesthetic like lidocaine . A study focusing on coccydynia (tailbone pain) found triamcinolone  was more effective than betamethasone , suggesting it may also be preferable for other localized inflammation conditions. Methylprednisolone : A common alternative to triamcinolone  that  is also a strong anti-inflammatory. It is available in different formulations, with the acetate suspension being the long-acting option for intra-articular injections. Dexamethasone : This is a non-particulate steroid, meaning it has a lower risk of tissue damage compared to particulate steroids like triamcinolone  and methylprednisolone . While less common  for this specific use, it is an option for targeted injections.   Considerations for physicians Particulate vs. non-particulate steroids: Triamcinolone  and methylprednisolone  are particulate, meaning they can clump together. Dexamethasone  is non-particulate. Particulate steroids are often preferred for their longer-lasting effects but carry a theoretical higher risk for certain injections (though this is less of a concern in the costochondral joints). Combined injectate: Corticosteroids are typically mixed with a local anesthetic like lidocaine  to provide both immediate pain relief (from the anesthetic) and longer-term inflammation reduction (from the steroid). Imaging guidance: To ensure accurate placement of the needle and medication, physicians may use ultrasound or fluoroscopic guidance for the injection, especially in complex or refractory cases.   Patient guidance Before undergoing a steroid injection, discuss the options with your physician. They will determine the best steroid, dosage, and procedure for your specific case based on factors like: Severity of your condition History of response to other treatments Your overall health status Experience and preference of the physician  Last  Updated: 12/18/2023 ______________________________________________________________________

## 2024-01-04 NOTE — Progress Notes (Signed)
 Cardiology Office Note  Date:  01/07/2024   ID:  Raymond [name], DOB 11/30/1965, MRN [id number]  PCP:  Liana Fish, NP   Chief Complaint  Patient presents with   New Patient (Initial Visit)    Ref by Dr. Benjiman Bathe for shortness of breath. Patient c/o shortness of breath with any amount of exertion and pounding in chest.     HPI:  Raymond [name] a 58 y.o. male with past medical history of: Chronic pain Diabetes type 2 Shortness of breath/emphysema/smoker S/p lobectomy Who presents by referral from Dr. Patrcia Bathe for shortness of breath  Previously followed by [facility] cardiology 2017 No recent cardiac studies  Echo 2020 through outside facility Normal EF  Stress test July 2025 low risk study small in size, mild in severity, fixed apical inferior and lateral defect most consistent with scar but cannot rule out artifact.   Aortic atherosclerosis on CT scan  SOB on exertion, also with bendopnea Losing weight through diet changes Was 260, now 207  I have a torn abd, ventral hernia  Labs reviewed A1C 9.0 down to 7 up to 8 after steroid shots Total chol 151, LDL 76 in 2023  EKG personally reviewed by myself on todays visit EKG Interpretation Date/Time:  Monday January 07 2024 08:50:33 EDT Ventricular Rate:  67 PR Interval:  130 QRS Duration:  84 QT Interval:  392 QTC Calculation: 414 R Axis:   10  Text Interpretation: Normal sinus rhythm Normal ECG When compared with ECG of 05-Jul-2023 12:02, No significant change was found Confirmed by Perla Lye [phone]) on 01/07/2024 8:51:33 AM    PMH:   has a past medical history of Anxiety, Arthritis, Bronchitis, Cancer (HCC), Coronary artery disease, Depression, Diabetes mellitus without complication (HCC), Diastasis of rectus abdominis (06/19/2018), Dyspnea, Dysrhythmia, Emphysema lung (HCC), Family history of adverse reaction to anesthesia, Fatty liver, GERD (gastroesophageal reflux disease), Headache,  Hyperlipidemia, Hypertension, Irregular heart beat (unk), Myocardial infarction (HCC) (2010), PTSD (post-traumatic stress disorder), Sleep apnea, Tachycardia, and Ventral hernia.  PSH:    Past Surgical History:  Procedure Laterality Date    ingrown toenail removal     CHOLECYSTECTOMY N/A 01/23/2019   Procedure: LAPAROSCOPIC CHOLECYSTECTOMY;  Surgeon: Desiderio Schanz, MD;  Location: [facility] ORS;  Service: General;  Laterality: N/A;   CLAVICLE SURGERY Right    COLONOSCOPY  2017   COLONOSCOPY WITH PROPOFOL  N/A 08/25/2020   Procedure: COLONOSCOPY WITH PROPOFOL ;  Surgeon: Janalyn [name] NOVAK, MD;  Location: [facility] ENDOSCOPY;  Service: Endoscopy;  Laterality: N/A;   ESOPHAGOGASTRODUODENOSCOPY (EGD) WITH PROPOFOL  N/A 08/25/2020   Procedure: ESOPHAGOGASTRODUODENOSCOPY (EGD) WITH PROPOFOL ;  Surgeon: Janalyn [name] NOVAK, MD;  Location: [facility] ENDOSCOPY;  Service: Endoscopy;  Laterality: N/A;   IR THORACENTESIS ASP PLEURAL SPACE W/IMG GUIDE  11/20/2022   LUNG LOBECTOMY Right 2024   LYMPH NODE DISSECTION Right 10/27/2022   Procedure: LYMPH NODE DISSECTION;  Surgeon: Kerrin Elspeth BROCKS, MD;  Location: [facility] OR;  Service: Thoracic;  Laterality: Right;   MOUTH SURGERY     REMOVED ALL TEETH   SHOULDER SURGERY Right    UPPER GI ENDOSCOPY  2017   XI ROBOTIC ASSISTED THORACOSCOPY- SEGMENTECTOMY Right 10/27/2022   Procedure: XI ROBOTIC ASSISTED THORACOSCOPY-RIGHT LOWER LOBE SUPERIOR SEGMENTECTOMY;  Surgeon: Kerrin Elspeth BROCKS, MD;  Location: [facility] OR;  Service: Thoracic;  Laterality: Right;    Current Outpatient Medications  Medication Sig Dispense Refill   Accu-Chek Softclix Lancets lancets Use 1 lancet to check glucose once daily and as needed for diabetes 100  each 12   acetaminophen  (TYLENOL ) 500 MG tablet Take 2 tablets (1,000 mg total) by mouth 4 (four) times daily. 30 tablet 0   albuterol  (VENTOLIN  HFA) 108 (90 Base) MCG/ACT inhaler INHALE 2 PUFFS INTO THE LUNGS EVERY 6 HOURS AS MEEDED FOR WHEEZING OR  SHORTNESS OF BREATH 18 g 1   aspirin  EC 81 MG tablet Take 81 mg by mouth daily at 3 pm.      atorvastatin  (LIPITOR) 40 MG tablet Take 1 tablet (40 mg total) by mouth daily. 90 tablet 3   Blood Glucose Monitoring Suppl (ACCU-CHEK GUIDE ME) w/Device KIT Use as directed. E11.65 1 kit 0   BREZTRI  AEROSPHERE 160-9-4.8 MCG/ACT AERO inhaler INHALE 2 PUFFS TWICE DAILY 11 g 0   buPROPion  (WELLBUTRIN  XL) 150 MG 24 hr tablet Take 1 tablet (150 mg total) by mouth daily. Take along with 300 mg daily 90 tablet 3   buPROPion  (WELLBUTRIN  XL) 300 MG 24 hr tablet Take 1 tablet (300 mg total) by mouth daily. Take along with 150 mg daily 90 tablet 3   Cholecalciferol (VITAMIN D3) 125 MCG (5000 UT) CAPS Take 1 capsule (5,000 Units total) by mouth daily with breakfast. Take along with calcium  and magnesium. 90 capsule 0   Continuous Glucose Sensor (DEXCOM G7 SENSOR) MISC Use one every 10 days for uncontrolled diabetes E11.65 3 each 11   Cyanocobalamin (VITAMIN B-12) 5000 MCG SUBL Place 1 tablet (5,000 mcg total) under the tongue daily. 30 tablet 0   cyclobenzaprine  (FLEXERIL ) 10 MG tablet Take 0.5-1 tablets (5-10 mg total) by mouth 3 (three) times daily as needed for muscle spasms. 60 tablet 2   dapagliflozin  propanediol (FARXIGA ) 10 MG TABS tablet Take 1 tablet (10 mg total) by mouth daily before breakfast. 90 tablet 2   DULoxetine  (CYMBALTA ) 30 MG capsule Take 1 capsule (30 mg total) by mouth daily. Take along with 60 mg daily , total of 90 mg daily 90 capsule 3   DULoxetine  (CYMBALTA ) 60 MG capsule Take 1 capsule (60 mg total) by mouth daily. Take along with 30 mg daily 90 capsule 3   ergocalciferol  (VITAMIN D2) 1.25 MG (50000 UT) capsule Take 1 capsule (50,000 Units total) by mouth 2 (two) times a week. X 6 weeks. 12 capsule 0   ezetimibe  (ZETIA ) 10 MG tablet Take 1 tablet by mouth once daily 90 tablet 0   famotidine  (PEPCID ) 20 MG tablet Take 1 tablet by mouth twice daily 180 tablet 0   glucose blood (ACCU-CHEK  GUIDE) test strip Use 1 test strip to check fasting glucose once daily and as need for diabetes 100 each 12   insulin  glargine, 2 Unit Dial , (TOUJEO  MAX SOLOSTAR) 300 UNIT/ML Solostar Pen Inject 50 Units into the skin daily. 15 mL 3   insulin  lispro (HUMALOG  KWIKPEN) 200 UNIT/ML KwikPen INJECT INSULIN  INTO THE SKIN WITH MEALS THREE TIMES DAILY PER SLIDING SCALE PROVIDED. MAX DAILY DOSE IS 42 UNITS 6 mL 3   Insulin  Pen Needle 32G X 4 MM MISC Use with daily dose insulin  and with sliding scale insulin  as needed. 100 each 5   Melatonin 10 MG CAPS Take 30 mg by mouth at bedtime.     meloxicam  (MOBIC ) 15 MG tablet Take 15 mg by mouth daily.     metoprolol  tartrate (LOPRESSOR ) 100 MG tablet Take 1 tablet by mouth twice daily 180 tablet 0   nitroGLYCERIN  (NITROSTAT ) 0.4 MG SL tablet Place 1 tablet (0.4 mg total) under the tongue every 5 (five)  minutes x 3 doses as needed for chest pain. 30 tablet 1   oxyCODONE  (OXY IR/ROXICODONE ) 5 MG immediate release tablet Take 1 tablet (5 mg total) by mouth 2 (two) times daily as needed for severe pain (pain score 7-10). 40 tablet 0   pantoprazole  (PROTONIX ) 40 MG tablet Take 1 tablet by mouth once daily 90 tablet 0   pregabalin  (LYRICA ) 50 MG capsule Take 1 capsule (50 mg total) by mouth 3 (three) times daily. For 7 days then increase to 2 capsules PO 3 times daily 159 capsule 0   rOPINIRole  (REQUIP ) 0.25 MG tablet TAKE 1 TABLET BY MOUTH THREE TIMES DAILY 90 tablet 3   Semaglutide , 1 MG/DOSE, (OZEMPIC , 1 MG/DOSE,) 4 MG/3ML SOPN INJECT 1MG  SUBCUTANEOUSLY ONCE WEEKLY 9 mL 3   tamsulosin  (FLOMAX ) 0.4 MG CAPS capsule Take 1 capsule by mouth once daily 90 capsule 0   temazepam  (RESTORIL ) 30 MG capsule Take 1 capsule (30 mg total) by mouth at bedtime as needed for sleep. 30 capsule 5   varenicline  (CHANTIX ) 1 MG tablet Take 1 tablet by mouth twice daily 60 tablet 3   No current facility-administered medications for this visit.     Allergies:   Onion, Norco  [hydrocodone-acetaminophen ], Nickel, and Nicoderm [nicotine ]   Social History:  The patient  reports that he has been smoking cigarettes. He has a 10 pack-year smoking history. He has quit using smokeless tobacco.  His smokeless tobacco use included chew. He reports that he does not drink alcohol and does not use drugs.   Family History:   family history includes Anxiety disorder in his brother, sister, sister, sister, and sister; COPD in his father; Colon cancer in his maternal uncle; Depression in his brother, sister, sister, sister, and sister; Diabetes in his brother, maternal aunt, mother, sister, sister, sister, and sister; Diabetes type II in his mother; Heart disease in his father; Hypertension in his mother; Lung cancer in his father.    Review of Systems: Review of Systems  Constitutional: Negative.   HENT: Negative.    Respiratory:  Positive for shortness of breath.   Cardiovascular: Negative.   Gastrointestinal: Negative.   Musculoskeletal: Negative.   Neurological: Negative.   Psychiatric/Behavioral: Negative.    All other systems reviewed and are negative.  PHYSICAL EXAM: VS:  BP 130/80 (BP Location: Left Arm, Patient Position: Sitting, Cuff Size: Normal)   Ht 5' 9 (1.753 m)   Wt 207 lb 2 oz (94 kg)   SpO2 99%   BMI 30.59 kg/m  , BMI Body mass index is 30.59 kg/m. GEN: Well nourished, well developed, in no acute distress HEENT: normal Neck: no JVD, carotid bruits, or masses Cardiac: RRR; no murmurs, rubs, or gallops,no edema  Respiratory:  clear to auscultation bilaterally, normal work of breathing GI: soft, nontender, nondistended, + BS MS: no deformity or atrophy Skin: warm and dry, no rash Neuro:  Strength and sensation are intact Psych: euthymic mood, full affect  Recent Labs: 11/07/2023: Magnesium 2.1 11/15/2023: ALT 19; BUN 19; Creatinine 1.04; Hemoglobin 14.3; Platelet Count 208; Potassium 4.6; Sodium 136    Lipid Panel Lab Results  Component Value  Date   CHOL 151 11/11/2021   HDL 34 (L) 11/11/2021   LDLCALC 76 11/11/2021   TRIG 251 (H) 11/11/2021      Wt Readings from Last 3 Encounters:  01/07/24 207 lb 2 oz (94 kg)  01/02/24 209 lb (94.8 kg)  12/18/23 209 lb (94.8 kg)  ASSESSMENT AND PLAN:  Problem List Items Addressed This Visit       Cardiology Problems   Hypertension associated with diabetes (HCC) (Chronic)   Relevant Orders   EKG 12-Lead (Completed)   Chronic diastolic heart failure (HCC) (Chronic)   Relevant Orders   EKG 12-Lead (Completed)   Aortic atherosclerosis (HCC) - Primary   Relevant Orders   EKG 12-Lead (Completed)     Other   OSA (obstructive sleep apnea) (Chronic)   Type 2 diabetes mellitus with hyperglycemia, with long-term current use of insulin  (HCC)    Shortness of breath Somewhat atypical in nature History of lobectomy Reports shortness of breath when bending over to pick up something or tie his shoes Mild shortness of breath on exertion Long history of smoking, continues to smoke 1/2 pack/day Echocardiogram 2020 with normal EF Recent Myoview  with no significant ischemia CT scan chest with mild coronary calcification proximal LAD and circumflex, mild aortic atherosclerosis - Recommend updating his echocardiogram to rule out pulmonary hypertension, change in his ejection fraction - Recommend he restart his treadmill exercise at the gym  Hyperlipidemia By primary care on Lipitor and Zetia  Numbers close to goal in 2023  Diabetes type 2 poorly controlled A1c up to 9 after cortisone injections Working with primary care to get his numbers down  Essential hypertension Blood pressure is well controlled on today's visit. No changes made to the medications.  Aortic atherosclerosis Mild, diffuse, recommend smoking cessation Continued aggressive diabetes control, cholesterol control  Patient was seen in consultation for Dr. Fernand and will be referred back to his office for ongoing  care of the issues detailed above  Signed, Velinda Lunger, M.D., Ph.D. [facility] [place], [place] [phone]

## 2024-01-06 ENCOUNTER — Other Ambulatory Visit: Payer: Self-pay | Admitting: Nurse Practitioner

## 2024-01-06 DIAGNOSIS — Z79899 Other long term (current) drug therapy: Secondary | ICD-10-CM

## 2024-01-07 ENCOUNTER — Ambulatory Visit: Attending: Cardiovascular Disease | Admitting: Cardiovascular Disease

## 2024-01-07 ENCOUNTER — Encounter: Payer: Self-pay | Admitting: Cardiovascular Disease

## 2024-01-07 VITALS — BP 128/78 | HR 67 | Ht 69.0 in | Wt 207.1 lb

## 2024-01-07 DIAGNOSIS — I152 Hypertension secondary to endocrine disorders: Secondary | ICD-10-CM

## 2024-01-07 DIAGNOSIS — E1159 Type 2 diabetes mellitus with other circulatory complications: Secondary | ICD-10-CM | POA: Diagnosis not present

## 2024-01-07 DIAGNOSIS — R0602 Shortness of breath: Secondary | ICD-10-CM | POA: Diagnosis not present

## 2024-01-07 DIAGNOSIS — I700 Atherosclerosis of aorta: Secondary | ICD-10-CM | POA: Diagnosis not present

## 2024-01-07 DIAGNOSIS — E1165 Type 2 diabetes mellitus with hyperglycemia: Secondary | ICD-10-CM

## 2024-01-07 DIAGNOSIS — G4733 Obstructive sleep apnea (adult) (pediatric): Secondary | ICD-10-CM | POA: Diagnosis not present

## 2024-01-07 DIAGNOSIS — I5032 Chronic diastolic (congestive) heart failure: Secondary | ICD-10-CM

## 2024-01-07 DIAGNOSIS — Z794 Long term (current) use of insulin: Secondary | ICD-10-CM | POA: Diagnosis not present

## 2024-01-07 NOTE — Patient Instructions (Addendum)
 Medication Instructions:  No changes  If you need a refill on your cardiac medications before your next appointment, please call your pharmacy.   Lab work: No new labs needed  Testing/Procedures: Your physician has requested that you have an echocardiogram. Echocardiography is a painless test that uses sound waves to create images of your heart. It provides your doctor with information about the size and shape of your heart and how well your heart's chambers and valves are working.   You may receive an ultrasound enhancing agent through an IV if needed to better visualize your heart during the echo. This procedure takes approximately one hour.  There are no restrictions for this procedure.  This will take place at [address] [facility] [address] [facility], [place] [address]  Please note: We ask at that you not bring children with you during ultrasound (echo/ vascular) testing. Due to room size and safety concerns, children are not allowed in the ultrasound rooms during exams. Our front office staff cannot provide observation of children in our lobby area while testing is being conducted. An adult accompanying a patient to their appointment will only be allowed in the ultrasound room at the discretion of the ultrasound technician under special circumstances. We apologize for any inconvenience.   Follow-Up: At [facility], you and your health needs are our priority.  As part of our continuing mission to provide you with exceptional heart care, we have created designated Provider Care Teams.  These Care Teams include your primary Cardiologist (physician) and Advanced Practice Providers (APPs -  Physician Assistants and Nurse Practitioners) who all work together to provide you with the care you need, when you need it.  You will need a follow up appointment as needed  Providers on your designated Care Team:   Nicolasa Ducking, NP Eula Listen, PA-C Cadence Fransico Michael, [place]  COVID-19 Vaccine  Information can be found at: [email or web address] For questions related to vaccine distribution or appointments, please email [email or web address] or call [phone].

## 2024-01-13 ENCOUNTER — Other Ambulatory Visit: Payer: Self-pay | Admitting: Psychiatry

## 2024-01-13 DIAGNOSIS — F17200 Nicotine dependence, unspecified, uncomplicated: Secondary | ICD-10-CM

## 2024-01-14 ENCOUNTER — Other Ambulatory Visit: Payer: Self-pay | Admitting: Nurse Practitioner

## 2024-01-14 DIAGNOSIS — J439 Emphysema, unspecified: Secondary | ICD-10-CM

## 2024-02-10 ENCOUNTER — Other Ambulatory Visit: Payer: Self-pay | Admitting: Nurse Practitioner

## 2024-02-10 DIAGNOSIS — J439 Emphysema, unspecified: Secondary | ICD-10-CM

## 2024-02-12 NOTE — Progress Notes (Signed)
 Raymond [name]                                          MRN: [id number]   02/12/2024   The [facility]BCI Quality Team Specialist reviewed this patient medical record for the purposes of chart review for care gap closure. The following were reviewed: abstraction for care gap closure-glycemic status assessment.    VBCI Quality Team

## 2024-02-14 ENCOUNTER — Ambulatory Visit: Attending: Cardiovascular Disease

## 2024-02-14 DIAGNOSIS — I5032 Chronic diastolic (congestive) heart failure: Secondary | ICD-10-CM | POA: Diagnosis not present

## 2024-02-14 DIAGNOSIS — R0602 Shortness of breath: Secondary | ICD-10-CM

## 2024-02-14 LAB — ECHOCARDIOGRAM COMPLETE
AR max vel: 2.32 cm2
AV Area VTI: 2.11 cm2
AV Area mean vel: 2.13 cm2
AV Mean grad: 3 mmHg
AV Peak grad: 5.5 mmHg
Ao pk vel: 1.17 m/s
Area-P 1/2: 4.29 cm2
S' Lateral: 4.35 cm

## 2024-02-19 NOTE — Progress Notes (Unsigned)
 Cardiology Clinic Note   Date: 02/20/2024 ID: Raymond [name], DOB [birth year], MRN [id number]  Primary Cardiologist:  Evalene Lunger, MD  Chief Complaint   Raymond [name] is a 58 y.o. male who presents to the clinic today for follow up after testing.   Patient Profile   Raymond [name] is followed by Dr. Gollan for the history outlined below.      Past medical history significant for: Coronary artery calcification/aortic atherosclerosis. Nuclear stress test 11/16/2023: Small in size, mild in severity fixed apical inferior and lateral defect most consistent with scar but cannot rule out artifact.  No significant ischemia identified.  Low risk study.  DOE. Echo 02/14/2024: EF 55 to 60%.  Regional wall motion unable to be evaluated.  Normal diastolic parameters.  Normal global strain.  Normal RV size/function.  Lipomatous hypertrophy of interatrial septum. Hypertension. Hyperlipidemia. Lipid panel 11/11/2021: LDL 76, HDL 34, TG 251, total 151. OSA. PE. Lung cancer. S/p lobectomy 10/27/2022. GERD. T2DM. Tobacco abuse. Chronic pain.  In summary, patient was first evaluated by Dr. Gollan on 01/07/2024 for shortness of breath.  Patient was previously followed by [facility] cardiology in 2017.  He underwent stress test ordered by PCP which was a low risk study without ischemia.  Patient reported DOE and bendopnea.  Echo demonstrated normal LV/RV function as detailed above.     History of Present Illness    Today, patient reports shortness of breath is unchanged. He notices it most when he bends over to tie his shoes. He is working to quit smoking. He is on Chantix  and reduced smoking from 2 packs a day to 1/2 a pack. He is inquiring about other things that may help him quit completely. Discussed results of echo in detail. All questions answered.     ROS: All other systems reviewed and are otherwise negative except as noted in History of Present Illness.  EKGs/Labs Reviewed        EKG is not performed today.   11/15/2023: ALT 19; AST 17; BUN 19; Creatinine 1.04; Potassium 4.6; Sodium 136   11/15/2023: Hemoglobin 14.3; WBC Count 8.9    Physical Exam    VS:  BP 134/76 (BP Location: Left Arm, Patient Position: Sitting, Cuff Size: Normal)   Pulse 76   Ht 5' 9 (1.753 m)   Wt 207 lb (93.9 kg)   SpO2 99%   BMI 30.57 kg/m  , BMI Body mass index is 30.57 kg/m.  GEN: Well nourished, well developed, in no acute distress. Neck: No JVD or carotid bruits. Cardiac:  RRR.  No murmur. No rubs or gallops.   Respiratory:  Respirations regular and unlabored. Clear to auscultation without rales, wheezing or rhonchi. GI: Soft, nontender, nondistended. Extremities: Radials/DP/PT 2+ and equal bilaterally. No clubbing or cyanosis. No edema  Skin: Warm and dry, no rash. Neuro: Strength intact.  Assessment & Plan   Coronary artery calcification/aortic atherosclerosis Nuclear stress test July 2025 small in size, mild in severity fixed apical inferior and lateral defect most consistent with scar but cannot rule out artifact, no significant ischemia, low risk study.  Patient denies chest pain. He has been active working on cars and getting daughter settled into a new home. He plans on joining a gym.  - Continue aspirin , atorvastatin , Zetia , metoprolol , as needed SL NTG.  DOE Echo October 2025 demonstrated normal LV/RV function, normal diastolic parameters, normal global strain.  Patient reports continued dyspnea particularly with bending over to tie his shoes. Breath sounds clear  to auscultation today. Given patient's history dyspnea likely coming from pulmonary etiology.   - Continue to monitor symptoms.   Hypertension BP today 134/76.  - Continue metoprolol .  Hyperlipidemia LDL 76 July 2023, not at goal. - Continue atorvastatin  and Zetia . - Repeat lipid panel per PCP.  Tobacco abuse Patient reduced smoking from 2 packs per day to 1/2 a pack. He is congratulated on his  efforts. He inquires about other things he can do to quit completely. Discussed strategies to systematically reduce cigarettes until complete cessation.  - Continue to work on complete cessation.  - Quit line information provided for patient.   Disposition: Return in 1 year or sooner as needed.          Signed, Raymond [name]. Hanley Woerner, DNP, NP-C

## 2024-02-20 ENCOUNTER — Encounter: Payer: Self-pay | Admitting: Student

## 2024-02-20 ENCOUNTER — Ambulatory Visit: Attending: Student | Admitting: Student

## 2024-02-20 VITALS — BP 134/76 | HR 76 | Ht 69.0 in | Wt 207.0 lb

## 2024-02-20 DIAGNOSIS — R0609 Other forms of dyspnea: Secondary | ICD-10-CM

## 2024-02-20 DIAGNOSIS — I2510 Atherosclerotic heart disease of native coronary artery without angina pectoris: Secondary | ICD-10-CM

## 2024-02-20 DIAGNOSIS — I700 Atherosclerosis of aorta: Secondary | ICD-10-CM | POA: Diagnosis not present

## 2024-02-20 DIAGNOSIS — Z720 Tobacco use: Secondary | ICD-10-CM

## 2024-02-20 DIAGNOSIS — E785 Hyperlipidemia, unspecified: Secondary | ICD-10-CM | POA: Diagnosis not present

## 2024-02-20 DIAGNOSIS — I10 Essential (primary) hypertension: Secondary | ICD-10-CM

## 2024-02-20 NOTE — Patient Instructions (Signed)
 Medication Instructions:   Your physician recommends that you continue on your current medications as directed. Please refer to the Current Medication list given to you today.    *If you need a refill on your cardiac medications before your next appointment, please call your pharmacy*  Lab Work:  None ordered at this time   If you have labs (blood work) drawn today and your tests are completely normal, you will receive your results only by:  MyChart Message (if you have MyChart) OR  A paper copy in the mail If you have any lab test that is abnormal or we need to change your treatment, we will call you to review the results.  Testing/Procedures:  None ordered at this time   Referrals:  None ordered at this time   Follow-Up:  At [facility], you and your health needs are our priority.  As part of our continuing mission to provide you with exceptional heart care, our providers are all part of one team.  This team includes your primary Cardiologist (physician) and Advanced Practice Providers or APPs (Physician Assistants and Nurse Practitioners) who all work together to provide you with the care you need, when you need it.  Your next appointment:   1 year(s)  Provider:    Evalene Lunger, MD or Barnie Hila, NP    We recommend signing up for the patient portal called MyChart.  Sign up information is provided on this After Visit Summary.  MyChart is used to connect with patients for Virtual Visits (Telemedicine).  Patients are able to view lab/test results, encounter notes, upcoming appointments, etc.  Non-urgent messages can be sent to your provider as well.   To learn more about what you can do with MyChart, go to [email or web address].   Other Instructions Quit Smoking Resources:  Call Quitline[place] Get free tobacco cessation help 24/7 in several ways:  1-800-QUIT-NOW 717-699-2218); Espaol: 1-855-Djelo-Ya (1-(226)451-8159) o para ms informacin haga clic  aqu ; Interpretation services available for many languages; Register online ( en espaol ) TTY: (931)837-2326 [place] Quitline: Call 888-7AI-QUIT 574-783-7680)    Enroll Online Once you register, you will have an online dashboard to help you track your quitting program. You will be able to interact with your quit coach using text, online chat or telephone.  Register now ( en espaol ).  Or Text Ready to [phone])682-0071

## 2024-02-23 ENCOUNTER — Ambulatory Visit: Payer: Self-pay | Admitting: Cardiovascular Disease

## 2024-02-26 ENCOUNTER — Other Ambulatory Visit: Payer: Self-pay | Admitting: Nurse Practitioner

## 2024-02-26 DIAGNOSIS — E1169 Type 2 diabetes mellitus with other specified complication: Secondary | ICD-10-CM

## 2024-02-27 ENCOUNTER — Ambulatory Visit: Admitting: Nurse Practitioner

## 2024-03-11 ENCOUNTER — Other Ambulatory Visit: Payer: Self-pay | Admitting: Nurse Practitioner

## 2024-03-11 DIAGNOSIS — J439 Emphysema, unspecified: Secondary | ICD-10-CM

## 2024-03-27 ENCOUNTER — Other Ambulatory Visit: Payer: Self-pay | Admitting: Nurse Practitioner

## 2024-03-27 DIAGNOSIS — K219 Gastro-esophageal reflux disease without esophagitis: Secondary | ICD-10-CM

## 2024-03-27 DIAGNOSIS — E1169 Type 2 diabetes mellitus with other specified complication: Secondary | ICD-10-CM

## 2024-03-27 DIAGNOSIS — I700 Atherosclerosis of aorta: Secondary | ICD-10-CM

## 2024-03-27 DIAGNOSIS — Z79899 Other long term (current) drug therapy: Secondary | ICD-10-CM

## 2024-04-03 ENCOUNTER — Other Ambulatory Visit: Payer: Self-pay | Admitting: Psychiatry

## 2024-04-03 DIAGNOSIS — F4310 Post-traumatic stress disorder, unspecified: Secondary | ICD-10-CM

## 2024-04-03 DIAGNOSIS — F3342 Major depressive disorder, recurrent, in full remission: Secondary | ICD-10-CM

## 2024-04-06 ENCOUNTER — Other Ambulatory Visit: Payer: Self-pay | Admitting: Nurse Practitioner

## 2024-04-06 DIAGNOSIS — E1169 Type 2 diabetes mellitus with other specified complication: Secondary | ICD-10-CM

## 2024-04-06 DIAGNOSIS — Z79899 Other long term (current) drug therapy: Secondary | ICD-10-CM

## 2024-04-15 ENCOUNTER — Other Ambulatory Visit: Payer: Self-pay | Admitting: Nurse Practitioner

## 2024-04-15 DIAGNOSIS — J439 Emphysema, unspecified: Secondary | ICD-10-CM

## 2024-04-20 ENCOUNTER — Other Ambulatory Visit: Payer: Self-pay | Admitting: Psychiatry

## 2024-04-20 ENCOUNTER — Other Ambulatory Visit: Payer: Self-pay | Admitting: Nurse Practitioner

## 2024-04-20 DIAGNOSIS — E1169 Type 2 diabetes mellitus with other specified complication: Secondary | ICD-10-CM

## 2024-04-20 DIAGNOSIS — G4701 Insomnia due to medical condition: Secondary | ICD-10-CM

## 2024-04-26 ENCOUNTER — Other Ambulatory Visit: Payer: Self-pay | Admitting: Nurse Practitioner

## 2024-04-26 DIAGNOSIS — I700 Atherosclerosis of aorta: Secondary | ICD-10-CM

## 2024-04-30 ENCOUNTER — Telehealth: Payer: Self-pay | Admitting: Internal Medicine

## 2024-04-30 NOTE — Telephone Encounter (Signed)
 Lvm & sent mychart msg to move 10/15/2024 appointment to -Andree

## 2024-05-01 ENCOUNTER — Telehealth: Admitting: Psychiatry

## 2024-05-05 ENCOUNTER — Other Ambulatory Visit: Payer: Self-pay | Admitting: Psychiatry

## 2024-05-05 DIAGNOSIS — F4310 Post-traumatic stress disorder, unspecified: Secondary | ICD-10-CM

## 2024-05-05 DIAGNOSIS — F3342 Major depressive disorder, recurrent, in full remission: Secondary | ICD-10-CM

## 2024-05-07 ENCOUNTER — Encounter: Payer: Self-pay | Admitting: Psychiatry

## 2024-05-07 ENCOUNTER — Telehealth (INDEPENDENT_AMBULATORY_CARE_PROVIDER_SITE_OTHER): Admitting: Psychiatry

## 2024-05-07 DIAGNOSIS — F3342 Major depressive disorder, recurrent, in full remission: Secondary | ICD-10-CM | POA: Diagnosis not present

## 2024-05-07 DIAGNOSIS — F418 Other specified anxiety disorders: Secondary | ICD-10-CM | POA: Diagnosis not present

## 2024-05-07 DIAGNOSIS — F17200 Nicotine dependence, unspecified, uncomplicated: Secondary | ICD-10-CM

## 2024-05-07 DIAGNOSIS — R258 Other abnormal involuntary movements: Secondary | ICD-10-CM

## 2024-05-07 DIAGNOSIS — F17210 Nicotine dependence, cigarettes, uncomplicated: Secondary | ICD-10-CM

## 2024-05-07 DIAGNOSIS — R52 Pain, unspecified: Secondary | ICD-10-CM

## 2024-05-07 DIAGNOSIS — G4701 Insomnia due to medical condition: Secondary | ICD-10-CM | POA: Diagnosis not present

## 2024-05-07 DIAGNOSIS — F4310 Post-traumatic stress disorder, unspecified: Secondary | ICD-10-CM

## 2024-05-07 MED ORDER — ROPINIROLE HCL 0.5 MG PO TABS: 0.5000 mg | ORAL_TABLET | Freq: Three times a day (TID) | ORAL | 1 refills | Status: AC

## 2024-05-07 MED ORDER — DULOXETINE HCL 30 MG PO CPEP: 30.0000 mg | ORAL_CAPSULE | Freq: Every day | ORAL | 1 refills | Status: AC

## 2024-05-07 MED ORDER — BUPROPION HCL ER (XL) 300 MG PO TB24: 300.0000 mg | ORAL_TABLET | Freq: Every day | ORAL | 3 refills | Status: AC

## 2024-05-07 MED ORDER — BUPROPION HCL ER (XL) 150 MG PO TB24: 150.0000 mg | ORAL_TABLET | Freq: Every day | ORAL | 3 refills | Status: AC

## 2024-05-07 NOTE — Patient Instructions (Signed)
 Insomnia Insomnia is a sleep disorder that makes it difficult to fall asleep or stay asleep. Insomnia can cause fatigue, low energy, difficulty concentrating, mood swings, and poor performance at work or school. There are three different ways to classify insomnia: Difficulty falling asleep. Difficulty staying asleep. Waking up too early in the morning. Any type of insomnia can be long-term (chronic) or short-term (acute). Both are common. Short-term insomnia usually lasts for 3 months or less. Chronic insomnia occurs at least three times a week for longer than 3 months. What are the causes? Insomnia may be caused by another condition, situation, or substance, such as: Having certain mental health conditions, such as anxiety and depression. Using caffeine, alcohol , tobacco, or drugs. Having gastrointestinal conditions, such as gastroesophageal reflux disease (GERD). Having certain medical conditions. These include: Asthma. Alzheimer's disease. Stroke. Chronic pain. An overactive thyroid  gland (hyperthyroidism). Other sleep disorders, such as restless legs syndrome and sleep apnea. Menopause. Sometimes, the cause of insomnia may not be known. What increases the risk? Risk factors for insomnia include: Gender. Females are affected more often than males. Age. Insomnia is more common as people get older. Stress and certain medical and mental health conditions. Lack of exercise. Having an irregular work schedule. This may include working night shifts and traveling between different time zones. What are the signs or symptoms? If you have insomnia, the main symptom is having trouble falling asleep or having trouble staying asleep. This may lead to other symptoms, such as: Feeling tired or having low energy. Feeling nervous about going to sleep. Not feeling rested in the morning. Having trouble concentrating. Feeling irritable, anxious, or depressed. How is this diagnosed? This condition  may be diagnosed based on: Your symptoms and medical history. Your health care provider may ask about: Your sleep habits. Any medical conditions you have. Your mental health. A physical exam. How is this treated? Treatment for insomnia depends on the cause. Treatment may focus on treating an underlying condition that is causing the insomnia. Treatment may also include: Medicines to help you sleep. Counseling or therapy. Lifestyle adjustments to help you sleep better. Follow these instructions at home: Eating and drinking  Limit or avoid alcohol , caffeinated beverages, and products that contain nicotine and tobacco, especially close to bedtime. These can disrupt your sleep. Do not eat a large meal or eat spicy foods right before bedtime. This can lead to digestive discomfort that can make it hard for you to sleep. Sleep habits  Keep a sleep diary to help you and your health care provider figure out what could be causing your insomnia. Write down: When you sleep. When you wake up during the night. How well you sleep and how rested you feel the next day. Any side effects of medicines you are taking. What you eat and drink. Make your bedroom a dark, comfortable place where it is easy to fall asleep. Put up shades or blackout curtains to block light from outside. Use a white noise machine to block noise. Keep the temperature cool. Limit screen use before bedtime. This includes: Not watching TV. Not using your smartphone, tablet, or computer. Stick to a routine that includes going to bed and waking up at the same times every day and night. This can help you fall asleep faster. Consider making a quiet activity, such as reading, part of your nighttime routine. Try to avoid taking naps during the day so that you sleep better at night. Get out of bed if you are still awake after  15 minutes of trying to sleep. Keep the lights down, but try reading or doing a quiet activity. When you feel  sleepy, go back to bed. General instructions Take over-the-counter and prescription medicines only as told by your health care provider. Exercise regularly as told by your health care provider. However, avoid exercising in the hours right before bedtime. Use relaxation techniques to manage stress. Ask your health care provider to suggest some techniques that may work well for you. These may include: Breathing exercises. Routines to release muscle tension. Visualizing peaceful scenes. Make sure that you drive carefully. Do not drive if you feel very sleepy. Keep all follow-up visits. This is important. Contact a health care provider if: You are tired throughout the day. You have trouble in your daily routine due to sleepiness. You continue to have sleep problems, or your sleep problems get worse. Get help right away if: You have thoughts about hurting yourself or someone else. Get help right away if you feel like you may hurt yourself or others, or have thoughts about taking your own life. Go to your nearest emergency room or: Call 911. Call the National Suicide Prevention Lifeline at 2232757840 or 988. This is open 24 hours a day. Text the Crisis Text Line at 657-529-4371. Summary Insomnia is a sleep disorder that makes it difficult to fall asleep or stay asleep. Insomnia can be long-term (chronic) or short-term (acute). Treatment for insomnia depends on the cause. Treatment may focus on treating an underlying condition that is causing the insomnia. Keep a sleep diary to help you and your health care provider figure out what could be causing your insomnia. This information is not intended to replace advice given to you by your health care provider. Make sure you discuss any questions you have with your health care provider. Document Revised: 03/21/2021 Document Reviewed: 03/21/2021 [employer] Patient Education  2024 [employer].

## 2024-05-07 NOTE — Progress Notes (Unsigned)
 Virtual Visit via Video Note  I connected with Raymond [name] on 05/07/2024 at  4:20 PM EST by a video enabled telemedicine application and verified that I am speaking with the correct person using two identifiers.  Location Provider Location : [facility] Patient Location : Home  Participants: Patient , Provider    I discussed the limitations of evaluation and management by telemedicine and the availability of in person appointments. The patient expressed understanding and agreed to proceed.  I discussed the assessment and treatment plan with the patient. The patient was provided an opportunity to ask questions and all were answered. The patient agreed with the plan and demonstrated an understanding of the instructions.   The patient was advised to call back or seek an in-person evaluation if the symptoms worsen or if the condition fails to improve as anticipated.                                                               BH MD OP Progress Note  05/09/2024 2:11 PM RAD GRAMLING  MRN:  [id number]  Chief Complaint:  Chief Complaint  Patient presents with   Follow-up   Depression   Medication Refill   Insomnia   Discussed the use of AI scribe software for clinical note transcription with the patient, who gave verbal consent to proceed.  History of Present Illness Raymond [name] is a 59 year old Caucasian male, lives in [place], divorced, has a history of MDD, PTSD, insomnia, anxiety, tobacco use disorder, hyperlipidemia, diabetes mellitus, hepatic steatosis, lung cancer with right lower lobe superior segmentectomy (10/27/2022) was evaluated by telemedicine today for a follow-up appointment.  Ongoing psychosocial stress related to housing instability affects him, as he reports that his landlord is evicting him and his family after he reported unresolved sewage issues [place] his [place]. Uncertainty about where to relocate his trailer and concern about navigating government  assistance and the logistics of moving contribute to his stress. He describes these circumstances as stressful but states that he copes adequately and focuses on problem-solving.  Sleep disturbance manifests as frequent nighttime awakenings. He describes falling asleep without difficulty but waking up several times per night, often finding it hard to return to sleep. He reports a history of sleep apnea and periodic limb movement disorder, noting that he uses a CPAP device .He also continues to be compliant on ropinirole  for limb movement symptoms. He states that the medication does not appear to be effective. He denies taking daytime naps.He reports daily caffeine intake, currently consuming 2 to 3 bottles of soda (approximately 20-24 ounces each) per day.  He confirms current use of Wellbutrin  (300 mg and 150 mg in the morning), Cymbalta  (60 mg and 30 mg), Temazepam , Lyrica , Ropinirole .  He denies side effects to medications.  He denies suicidal thoughts, thoughts of harming others, auditory hallucinations, and paranoia.  He reports current tobacco use, stating that he is trying to cut back and currently smokes about half the amount he previously did. He continues to use Chantix  for smoking cessation.      Visit Diagnosis:    ICD-10-CM   1. MDD (major depressive disorder), recurrent, in full remission  F33.42 DULoxetine  (CYMBALTA ) 30 MG capsule    buPROPion  (WELLBUTRIN  XL) 150 MG 24 hr tablet  2. PTSD (post-traumatic stress disorder)  F43.10     3. Insomnia due to medical condition  G47.01 rOPINIRole  (REQUIP ) 0.5 MG tablet   Pain, OSA , Periodic limb movement    4. Other specified anxiety disorders  F41.8    Generalized anxiety not occurring mpre days than not    5. Tobacco use disorder  F17.200 buPROPion  (WELLBUTRIN  XL) 300 MG 24 hr tablet   Moderate      Past Psychiatric History: I have reviewed past psychiatric history from progress note on 05/23/2018.  Past trials of Tegretol ,  doxepin, Seroquel , mirtazapine , Belsomra , Lunesta, Ativan , risperidone, Lexapro   Past Medical History:  Past Medical History:  Diagnosis Date   Anxiety    Arthritis    NECK   Asthma    Bronchitis    Cancer (HCC)    R lung, had lobectomy   Coronary artery disease    Depression    Diabetes mellitus without complication (HCC)    Diastasis of rectus abdominis 06/19/2018   Dyspnea    DUE TO GALLBLADDER PER PT   Dysrhythmia    fast hear rate at times. stopped when placed on metoprolol    Emphysema lung (HCC)    Family history of adverse reaction to anesthesia    N/V, TROUBLE BREATHING COMING OUT OF ANESTHESIA   Fatty liver    GERD (gastroesophageal reflux disease)    Headache    MIGRAINES   Hyperlipidemia    Hypertension    Irregular heart beat unk   Lung cancer (HCC) 7 5 2024   Myocardial infarction (HCC) 2010   MILD    PTSD (post-traumatic stress disorder)    Sleep apnea    USES CPAP   Tachycardia    Ventral hernia     Past Surgical History:  Procedure Laterality Date    ingrown toenail removal     CHOLECYSTECTOMY N/A 01/23/2019   Procedure: LAPAROSCOPIC CHOLECYSTECTOMY;  Surgeon: Desiderio Schanz, MD;  Location: [facility];  Service: General;  Laterality: N/A;   CLAVICLE SURGERY Right    COLONOSCOPY  2017   COLONOSCOPY WITH PROPOFOL  N/A 08/25/2020   Procedure: COLONOSCOPY WITH PROPOFOL ;  Surgeon: Janalyn [name] NOVAK, MD;  Location: [facility];  Service: Endoscopy;  Laterality: N/A;   ESOPHAGOGASTRODUODENOSCOPY (EGD) WITH PROPOFOL  N/A 08/25/2020   Procedure: ESOPHAGOGASTRODUODENOSCOPY (EGD) WITH PROPOFOL ;  Surgeon: Janalyn [name] NOVAK, MD;  Location: [facility] END[facility];  Service: Endoscopy;  Laterality: N/A;   IR THORACENTESIS RIGHT ASP PLEURAL SPACE W/IMG GUIDE  11/20/2022   LUNG LOBECTOMY Right 2024   LYMPH NODE DISSECTION Right 10/27/2022   Procedure: LYMPH NODE DISSECTION;  Surgeon: Kerrin Elspeth BROCKS, MD;  Location: [facility] OR;  Service: Thoracic;  Laterality: Right;    MOUTH SURGERY     REMOVED ALL TEETH   SHOULDER SURGERY Right    UPPER GI ENDOSCOPY  2017   XI ROBOTIC ASSISTED THORACOSCOPY- SEGMENTECTOMY Right 10/27/2022   Procedure: XI ROBOTIC ASSISTED THORACOSCOPY-RIGHT LOWER LOBE SUPERIOR SEGMENTECTOMY;  Surgeon: Kerrin Elspeth BROCKS, MD;  Location: [facility] OR;  Service: Thoracic;  Laterality: Right;    Family Psychiatric History: I have reviewed family psychiatric history from progress note on 05/23/2018.  Family History:  Family History  Problem Relation Age of Onset   Hypertension Mother    Diabetes type II Mother    Diabetes Mother    COPD Father    Lung cancer Father    Heart disease Father    Diabetes Sister    Anxiety disorder Sister    Depression Sister  Diabetes Sister    Anxiety disorder Sister    Depression Sister    Diabetes Sister    Anxiety disorder Sister    Depression Sister    Diabetes Sister    Anxiety disorder Sister    Depression Sister    Diabetes Brother    Anxiety disorder Brother    Depression Brother    Diabetes Maternal Aunt    Colon cancer Maternal Uncle     Social History: I have reviewed social history from progress note on 05/23/2018. Social History   Socioeconomic History   Marital status: Divorced    Spouse name: Not on file   Number of children: 4   Years of education: Not on file   Highest education level: 8th grade  Occupational History   Not on file  Tobacco Use   Smoking status: Every Day    Current packs/day: 0.25    Average packs/day: 0.3 packs/day for 40.0 years (10.0 ttl pk-yrs)    Types: Cigarettes   Smokeless tobacco: Former    Types: Chew   Tobacco comments:    Reports smoking 1/2 pack a day and trying to cut back   Vaping Use   Vaping status: Former  Substance and Sexual Activity   Alcohol use: No    Alcohol/week: 0.0 standard drinks of alcohol   Drug use: No   Sexual activity: Not Currently  Other Topics Concern   Not on file  Social History Narrative   Not on file    Social Drivers of Health   Tobacco Use: High Risk (05/07/2024)   Patient History    Smoking Tobacco Use: Every Day    Smokeless Tobacco Use: Former    Passive Exposure: Not on Actuary Strain: Not on file  Food Insecurity: Food Insecurity Present (12/14/2022)   Hunger Vital Sign    Worried About Running Out of Food in the Last Year: Often true    Ran Out of Food in the Last Year: Often true  Transportation Needs: No Transportation Needs (12/14/2022)   PRAPARE - Administrator, Civil Service (Medical): No    Lack of Transportation (Non-Medical): No  Physical Activity: Not on file  Stress: Not on file  Social Connections: Not on file  Depression (PHQ2-9): Low Risk (01/02/2024)   Depression (PHQ2-9)    PHQ-2 Score: 2  Alcohol Screen: Low Risk (01/18/2022)   Alcohol Screen    Last Alcohol Screening Score (AUDIT): 0  Housing: Medium Risk (12/14/2022)   Housing    Last Housing Risk Score: 1  Utilities: Not At Risk (12/14/2022)   AHC Utilities    Threatened with loss of utilities: No  Health Literacy: Not on file    Allergies: Allergies[1]  Metabolic Disorder Labs: Lab Results  Component Value Date   HGBA1C 6.7 (A) 08/08/2023   MPG 205.86 10/25/2022   MPG 294.83 05/31/2017   No results found for: PROLACTIN Lab Results  Component Value Date   CHOL 151 11/11/2021   TRIG 251 (H) 11/11/2021   HDL 34 (L) 11/11/2021   CHOLHDL 4.4 11/11/2021   VLDL 26 12/09/2012   LDLCALC 76 11/11/2021   LDLCALC 153 (H) 08/17/2020   Lab Results  Component Value Date   TSH 1.890 08/17/2020   TSH 4.120 06/10/2018    Therapeutic Level Labs: No results found for: LITHIUM No results found for: VALPROATE No results found for: CBMZ  Current Medications: Current Outpatient Medications  Medication Sig Dispense Refill   rOPINIRole  (  REQUIP ) 0.5 MG tablet Take 1 tablet (0.5 mg total) by mouth 3 (three) times daily. 90 tablet 1   ACCU-CHEK GUIDE TEST test  strip USE 1 TEST STRIP TO CHECK FASTING GLUCOSE ONCE DAILY AND AS NEEDED FOR DIABETES 100 each 0   Accu-Chek Softclix Lancets lancets Use 1 lancet to check glucose once daily and as needed for diabetes 100 each 12   acetaminophen  (TYLENOL ) 500 MG tablet Take 2 tablets (1,000 mg total) by mouth 4 (four) times daily. 30 tablet 0   albuterol  (VENTOLIN  HFA) 108 (90 Base) MCG/ACT inhaler INHALE 2 PUFFS INTO THE LUNGS EVERY 6 HOURS AS MEEDED FOR WHEEZING OR SHORTNESS OF BREATH 18 g 1   aspirin  EC 81 MG tablet Take 81 mg by mouth daily at 3 pm.      atorvastatin  (LIPITOR) 40 MG tablet Take 1 tablet by mouth once daily 90 tablet 0   Blood Glucose Monitoring Suppl (ACCU-CHEK GUIDE ME) w/Device KIT Use as directed. E11.65 1 kit 0   BREZTRI  AEROSPHERE 160-9-4.8 MCG/ACT AERO inhaler INHALE 2 PUFFS TWICE DAILY 11 g 0   buPROPion  (WELLBUTRIN  XL) 150 MG 24 hr tablet Take 1 tablet (150 mg total) by mouth daily. Take along with 300 mg daily 90 tablet 3   buPROPion  (WELLBUTRIN  XL) 300 MG 24 hr tablet Take 1 tablet (300 mg total) by mouth daily. Take along with 150 mg daily 90 tablet 3   Continuous Glucose Sensor (DEXCOM G7 SENSOR) MISC USE ONE SENSOR EVERY 10 DAYS FOR UNCONTROLLED DIABETES 3 each 0   Cyanocobalamin  (VITAMIN B-12) 5000 MCG SUBL Place 1 tablet (5,000 mcg total) under the tongue daily. 30 tablet 0   cyclobenzaprine  (FLEXERIL ) 10 MG tablet Take 0.5-1 tablets (5-10 mg total) by mouth 3 (three) times daily as needed for muscle spasms. 60 tablet 2   dapagliflozin  propanediol (FARXIGA ) 10 MG TABS tablet Take 1 tablet (10 mg total) by mouth daily before breakfast. 90 tablet 2   DULoxetine  (CYMBALTA ) 30 MG capsule Take 1 capsule (30 mg total) by mouth daily. Take along with 60 mg daily , total of 90 mg daily 90 capsule 1   DULoxetine  (CYMBALTA ) 60 MG capsule Take 1 capsule (60 mg total) by mouth daily. Take along with 30 mg daily 90 capsule 3   ezetimibe  (ZETIA ) 10 MG tablet Take 1 tablet by mouth once daily 90  tablet 0   famotidine  (PEPCID ) 20 MG tablet Take 1 tablet by mouth twice daily 180 tablet 0   insulin  glargine, 2 Unit Dial , (TOUJEO  MAX SOLOSTAR) 300 UNIT/ML Solostar Pen Inject 50 Units into the skin daily. 15 mL 3   insulin  lispro (HUMALOG  KWIKPEN) 200 UNIT/ML KwikPen INJECT INSULIN  INTO THE SKIN WITH MEALS THREE TIMES DAILY PER SLIDING SCALE PROVIDED. MAX DAILY DOSE IS 42 UNITS 6 mL 3   Insulin  Pen Needle 32G X 4 MM MISC Use with daily dose insulin  and with sliding scale insulin  as needed. 100 each 5   Melatonin 10 MG CAPS Take 30 mg by mouth at bedtime.     meloxicam  (MOBIC ) 15 MG tablet Take 15 mg by mouth daily.     metoprolol  tartrate (LOPRESSOR ) 100 MG tablet Take 1 tablet by mouth twice daily 180 tablet 0   nitroGLYCERIN  (NITROSTAT ) 0.4 MG SL tablet Place 1 tablet (0.4 mg total) under the tongue every 5 (five) minutes x 3 doses as needed for chest pain. 30 tablet 1   oxyCODONE  (OXY IR/ROXICODONE ) 5 MG immediate release tablet Take  1 tablet (5 mg total) by mouth 2 (two) times daily as needed for severe pain (pain score 7-10). 40 tablet 0   pantoprazole  (PROTONIX ) 40 MG tablet Take 1 tablet by mouth once daily 90 tablet 0   pregabalin  (LYRICA ) 50 MG capsule Take 1 capsule (50 mg total) by mouth 3 (three) times daily. For 7 days then increase to 2 capsules PO 3 times daily 159 capsule 0   Semaglutide , 1 MG/DOSE, (OZEMPIC , 1 MG/DOSE,) 4 MG/3ML SOPN INJECT 1MG  SUBCUTANEOUSLY ONCE WEEKLY 9 mL 3   tamsulosin  (FLOMAX ) 0.4 MG CAPS capsule Take 1 capsule by mouth once daily 90 capsule 0   temazepam  (RESTORIL ) 30 MG capsule TAKE 1 CAPSULE BY MOUTH AT BEDTIME AS NEEDED FOR SLEEP 30 capsule 0   varenicline  (CHANTIX ) 1 MG tablet Take 1 tablet by mouth twice daily 60 tablet 3   No current facility-administered medications for this visit.     Musculoskeletal: Strength & Muscle Tone: UTA Gait & Station: Seated Patient leans: N/A  Psychiatric Specialty Exam: Review of Systems   Psychiatric/Behavioral:  Positive for sleep disturbance. The patient is nervous/anxious.     There were no vitals taken for this visit.There is no height or weight on file to calculate BMI.  General Appearance: Fairly Groomed  Eye Contact:  Fair  Speech:  Normal Rate  Volume:  Normal  Mood:  Anxious  Affect:  Appropriate  Thought Process:  Goal Directed and Descriptions of Associations: Intact  Orientation:  Full (Time, Place, and Person)  Thought Content: Logical   Suicidal Thoughts:  No  Homicidal Thoughts:  No  Memory:  Immediate;   Fair Recent;   Fair Remote;   Fair  Judgement:  Fair  Insight:  Fair  Psychomotor Activity:  Normal  Concentration:  Concentration: Fair and Attention Span: Fair  Recall:  [employer] of Knowledge: Fair  Language: Fair  Akathisia:  No  Handed:  Right  AIMS (if indicated): not done  Assets:  Manufacturing Systems Engineer Desire for Improvement Housing Social Support Transportation  ADL's:  Intact  Cognition: WNL  Sleep:  varies   Screenings: GAD-7    Loss Adjuster, Chartered Office Visit from 10/30/2023 in [facility] Office Visit from 02/22/2023 in [facility] Office Visit from 02/09/2022 in [facility] Video Visit from 12/20/2021 in [facility] Video Visit from 06/20/2021 in [facility]  Total GAD-7 Score 5 5 7 6 8    Mini-Mental    Flowsheet Row Office Visit from 10/24/2023 in [facility] Office Visit from 10/23/2022 in [facility] Office Visit from 10/19/2021 in [facility], [facility] Clinical Support from 10/18/2020 in [facility], [facility] Clinical Support from 10/17/2019 in [facility], [facility]  Total Score (max 30 points ) 27 30 30 29 30    PHQ2-9    Flowsheet Row Office Visit from 01/02/2024 in [facility] Procedure visit from 12/18/2023 in [facility] Visit from 11/28/2023 in [facility] Visit from 11/15/2023 in [facility] [place] Office Visit from 11/07/2023 in [facility] Total Score 2 1 0 0 0   Flowsheet Row Video Visit  from 05/07/2024 in [facility] Video Visit from 12/31/2023 in [facility] Office Visit from 11/15/2023 in [facility] [place]  C-SSRS RISK CATEGORY Moderate Risk Moderate Risk No Risk     Assessment and Plan: Raymond [name] is a 59 year old Caucasian male who presented for a follow-up appointment, discussed assessment and plan as noted below.  1. MDD (major depressive disorder), recurrent, in full remission Currently reports overall mood symptoms as manageable on the current medication regimen. Continue Cymbalta  90 mg daily Continue Wellbutrin  XL 450 mg daily  2. PTSD (post-traumatic stress disorder)-stable Denies any intrusive memories flashbacks or nightmares Continue Cymbalta  and Wellbutrin  as prescribed.  3. Insomnia due to medical condition-unstable Ongoing sleep problems likely multifactorial including lack of sleep hygiene, sleep apnea, restless leg symptoms Increase Requip  to 0.5 mg 3 times a day Continue Temazepam  30 mg at bedtime Continue CPAP for OSA (sleep study-01/05/2021--showed Periodic limb movement) Reviewed  PMP AWARxE Encouraged to cut back on caffeine intake.  Discussed sleep hygiene techniques.   4. Other specified anxiety disorders-improving Currently reports although anxious has been coping  well. Continue Cymbalta  as prescribed. Continue Hydroxyzine  25 to 50 mg at bedtime as needed  5. Tobacco use disorder moderate-unstable Currently working on cutting back. Continue Chantix  1 mg twice daily  Follow-up Follow-up in clinic in 2 months or sooner in person.   Consent: Patient/Guardian gives verbal consent for treatment and assignment of benefits for services provided during this visit. Patient/Guardian expressed understanding and agreed to proceed.   This note was generated in part or whole with voice recognition software. Voice recognition is usually quite accurate but there are transcription errors that can and very often do occur. I apologize for any typographical errors that were not detected and corrected.    Hindy Perrault, MD 05/09/2024, 2:11 PM     [1]  Allergies Allergen Reactions   Onion Anaphylaxis   Norco [Hydrocodone-Acetaminophen ] Itching   Nickel Rash   Nicoderm [Nicotine ] Rash

## 2024-05-10 ENCOUNTER — Other Ambulatory Visit: Payer: Self-pay | Admitting: Psychiatry

## 2024-05-10 DIAGNOSIS — F17200 Nicotine dependence, unspecified, uncomplicated: Secondary | ICD-10-CM

## 2024-05-14 ENCOUNTER — Ambulatory Visit

## 2024-05-15 ENCOUNTER — Other Ambulatory Visit: Payer: Self-pay | Admitting: Nurse Practitioner

## 2024-05-15 DIAGNOSIS — J439 Emphysema, unspecified: Secondary | ICD-10-CM

## 2024-05-16 ENCOUNTER — Ambulatory Visit: Admitting: Nurse Practitioner

## 2024-05-16 ENCOUNTER — Encounter: Payer: Self-pay | Admitting: Nurse Practitioner

## 2024-05-16 VITALS — BP 146/90 | HR 100 | Temp 97.7°F | Resp 16 | Ht 69.0 in | Wt 213.6 lb

## 2024-05-16 DIAGNOSIS — E1159 Type 2 diabetes mellitus with other circulatory complications: Secondary | ICD-10-CM | POA: Diagnosis not present

## 2024-05-16 DIAGNOSIS — M25562 Pain in left knee: Secondary | ICD-10-CM

## 2024-05-16 DIAGNOSIS — I700 Atherosclerosis of aorta: Secondary | ICD-10-CM | POA: Diagnosis not present

## 2024-05-16 DIAGNOSIS — I152 Hypertension secondary to endocrine disorders: Secondary | ICD-10-CM

## 2024-05-16 DIAGNOSIS — Z794 Long term (current) use of insulin: Secondary | ICD-10-CM

## 2024-05-16 DIAGNOSIS — E1169 Type 2 diabetes mellitus with other specified complication: Secondary | ICD-10-CM | POA: Diagnosis not present

## 2024-05-16 DIAGNOSIS — G8929 Other chronic pain: Secondary | ICD-10-CM | POA: Diagnosis not present

## 2024-05-16 LAB — POCT GLYCOSYLATED HEMOGLOBIN (HGB A1C): Hemoglobin A1C: 11.6 % — AB (ref 4.0–5.6)

## 2024-05-16 MED ORDER — TOUJEO MAX SOLOSTAR 300 UNIT/ML ~~LOC~~ SOPN: 50.0000 [IU] | PEN_INJECTOR | Freq: Every day | SUBCUTANEOUS | 3 refills | Status: AC

## 2024-05-16 MED ORDER — DAPAGLIFLOZIN PROPANEDIOL 10 MG PO TABS: 10.0000 mg | ORAL_TABLET | Freq: Every day | ORAL | 2 refills | Status: AC

## 2024-05-16 MED ORDER — OZEMPIC (1 MG/DOSE) 4 MG/3ML ~~LOC~~ SOPN: 1.0000 mg | PEN_INJECTOR | SUBCUTANEOUS | 3 refills | Status: AC

## 2024-05-16 MED ORDER — DEXCOM G7 SENSOR MISC: 11 refills | Status: AC

## 2024-05-16 NOTE — Progress Notes (Signed)
 [facility] [address] [place], [place] [address]  Internal MEDICINE  Office Visit Note  Patient Name: Raymond [name]  [id number]  [id number]  Date of Service: 05/16/2024  Chief Complaint  Patient presents with   Acute Visit   Diabetes    Left knee issues    HPI Daryel presents for a follow-up visit for chronic left knee pain, diabetes, hypertension, and high cholesterol.  Chronic left knee pain -- this has been worsening over time and he has not seen an orthopedic specialist yet.  Diabetes -- he has not been remembering to take his medications every day like he is supposed to. His A1c is significantly elevated at 11.6 which is increased from 6.7 in April 2025.  Hypertension -- BP is elevated today. He does forget to take his medications sometimes. High cholesterol -- on statin medication     Current Medication: Outpatient Encounter Medications as of 05/16/2024  Medication Sig   ACCU-CHEK GUIDE TEST test strip USE 1 TEST STRIP TO CHECK FASTING GLUCOSE ONCE DAILY AND AS NEEDED FOR DIABETES   Accu-Chek Softclix Lancets lancets Use 1 lancet to check glucose once daily and as needed for diabetes   acetaminophen  (TYLENOL ) 500 MG tablet Take 2 tablets (1,000 mg total) by mouth 4 (four) times daily.   albuterol  (VENTOLIN  HFA) 108 (90 Base) MCG/ACT inhaler INHALE 2 PUFFS INTO THE LUNGS EVERY 6 HOURS AS MEEDED FOR WHEEZING OR SHORTNESS OF BREATH   aspirin  EC 81 MG tablet Take 81 mg by mouth daily at 3 pm.    atorvastatin  (LIPITOR) 40 MG tablet Take 1 tablet by mouth once daily   Blood Glucose Monitoring Suppl (ACCU-CHEK GUIDE ME) w/Device KIT Use as directed. E11.65 (Patient not taking: Reported on 05/16/2024)   BREZTRI  AEROSPHERE 160-9-4.8 MCG/ACT AERO inhaler INHALE 2 PUFFS TWICE DAILY   buPROPion  (WELLBUTRIN  XL) 150 MG 24 hr tablet Take 1 tablet (150 mg total) by mouth daily. Take along with 300 mg daily   buPROPion  (WELLBUTRIN  XL) 300 MG 24 hr tablet Take 1 tablet  (300 mg total) by mouth daily. Take along with 150 mg daily   Continuous Glucose Sensor (DEXCOM G7 SENSOR) MISC USE ONE SENSOR EVERY 10 DAYS FOR UNCONTROLLED DIABETES   Cyanocobalamin  (VITAMIN B-12) 5000 MCG SUBL Place 1 tablet (5,000 mcg total) under the tongue daily.   cyclobenzaprine  (FLEXERIL ) 10 MG tablet Take 0.5-1 tablets (5-10 mg total) by mouth 3 (three) times daily as needed for muscle spasms.   dapagliflozin  propanediol (FARXIGA ) 10 MG TABS tablet Take 1 tablet (10 mg total) by mouth daily before breakfast.   DULoxetine  (CYMBALTA ) 30 MG capsule Take 1 capsule (30 mg total) by mouth daily. Take along with 60 mg daily , total of 90 mg daily   DULoxetine  (CYMBALTA ) 60 MG capsule Take 1 capsule (60 mg total) by mouth daily. Take along with 30 mg daily   ezetimibe  (ZETIA ) 10 MG tablet Take 1 tablet by mouth once daily   famotidine  (PEPCID ) 20 MG tablet Take 1 tablet by mouth twice daily   insulin  glargine, 2 Unit Dial , (TOUJEO  MAX SOLOSTAR) 300 UNIT/ML Solostar Pen Inject 50 Units into the skin daily.   insulin  lispro (HUMALOG  KWIKPEN) 200 UNIT/ML KwikPen INJECT INSULIN  INTO THE SKIN WITH MEALS THREE TIMES DAILY PER SLIDING SCALE PROVIDED. MAX DAILY DOSE IS 42 UNITS   Insulin  Pen Needle 32G X 4 MM MISC Use with daily dose insulin  and with sliding scale insulin  as needed.   Melatonin 10  MG CAPS Take 30 mg by mouth at bedtime.   meloxicam  (MOBIC ) 15 MG tablet Take 15 mg by mouth daily.   metoprolol  tartrate (LOPRESSOR ) 100 MG tablet Take 1 tablet by mouth twice daily   nitroGLYCERIN  (NITROSTAT ) 0.4 MG SL tablet Place 1 tablet (0.4 mg total) under the tongue every 5 (five) minutes x 3 doses as needed for chest pain.   oxyCODONE  (OXY IR/ROXICODONE ) 5 MG immediate release tablet Take 1 tablet (5 mg total) by mouth 2 (two) times daily as needed for severe pain (pain score 7-10).   pantoprazole  (PROTONIX ) 40 MG tablet Take 1 tablet by mouth once daily   pregabalin  (LYRICA ) 50 MG capsule Take 1  capsule (50 mg total) by mouth 3 (three) times daily. For 7 days then increase to 2 capsules PO 3 times daily   rOPINIRole  (REQUIP ) 0.5 MG tablet Take 1 tablet (0.5 mg total) by mouth 3 (three) times daily.   Semaglutide , 1 MG/DOSE, (OZEMPIC , 1 MG/DOSE,) 4 MG/3ML SOPN Inject 1 mg into the skin once a week.   tamsulosin  (FLOMAX ) 0.4 MG CAPS capsule Take 1 capsule by mouth once daily   temazepam  (RESTORIL ) 30 MG capsule TAKE 1 CAPSULE BY MOUTH AT BEDTIME AS NEEDED FOR SLEEP   varenicline  (CHANTIX ) 1 MG tablet Take 1 tablet by mouth twice daily   [DISCONTINUED] Continuous Glucose Sensor (DEXCOM G7 SENSOR) MISC USE ONE SENSOR EVERY 10 DAYS FOR UNCONTROLLED DIABETES   [DISCONTINUED] dapagliflozin  propanediol (FARXIGA ) 10 MG TABS tablet Take 1 tablet (10 mg total) by mouth daily before breakfast.   [DISCONTINUED] insulin  glargine, 2 Unit Dial , (TOUJEO  MAX SOLOSTAR) 300 UNIT/ML Solostar Pen Inject 50 Units into the skin daily.   [DISCONTINUED] Semaglutide , 1 MG/DOSE, (OZEMPIC , 1 MG/DOSE,) 4 MG/3ML SOPN INJECT 1MG  SUBCUTANEOUSLY ONCE WEEKLY   No facility-administered encounter medications on file as of 05/16/2024.    Surgical History: Past Surgical History:  Procedure Laterality Date    ingrown toenail removal     CHOLECYSTECTOMY N/A 01/23/2019   Procedure: LAPAROSCOPIC CHOLECYSTECTOMY;  Surgeon: Desiderio Schanz, MD;  Location: [facility] ORS;  Service: General;  Laterality: N/A;   CLAVICLE SURGERY Right    COLONOSCOPY  2017   COLONOSCOPY WITH PROPOFOL  N/A 08/25/2020   Procedure: COLONOSCOPY WITH PROPOFOL ;  Surgeon: Janalyn [name] NOVAK, MD;  Location: [facility] END[facility];  Service: Endoscopy;  Laterality: N/A;   ESOPHAGOGASTRODUODENOSCOPY (EGD) WITH PROPOFOL  N/A 08/25/2020   Procedure: ESOPHAGOGASTRODUODENOSCOPY (EGD) WITH PROPOFOL ;  Surgeon: Janalyn [name] NOVAK, MD;  Location: [facility] END[facility];  Service: Endoscopy;  Laterality: N/A;   IR THORACENTESIS RIGHT ASP PLEURAL SPACE W/IMG GUIDE  11/20/2022   LUNG  LOBECTOMY Right 2024   LYMPH NODE DISSECTION Right 10/27/2022   Procedure: LYMPH NODE DISSECTION;  Surgeon: Kerrin Elspeth BROCKS, MD;  Location: [facility] OR;  Service: Thoracic;  Laterality: Right;   MOUTH SURGERY     REMOVED ALL TEETH   SHOULDER SURGERY Right    UPPER GI ENDOSCOPY  2017   XI ROBOTIC ASSISTED THORACOSCOPY- SEGMENTECTOMY Right 10/27/2022   Procedure: XI ROBOTIC ASSISTED THORACOSCOPY-RIGHT LOWER LOBE SUPERIOR SEGMENTECTOMY;  Surgeon: Kerrin Elspeth BROCKS, MD;  Location: [facility] OR;  Service: Thoracic;  Laterality: Right;    Medical History: Past Medical History:  Diagnosis Date   Anxiety    Arthritis    NECK   Asthma    Bronchitis    Cancer (HCC)    R lung, had lobectomy   Coronary artery disease    Depression    Diabetes mellitus without complication (HCC)  Diastasis of rectus abdominis 06/19/2018   Dyspnea    DUE TO GALLBLADDER PER PT   Dysrhythmia    fast hear rate at times. stopped when placed on metoprolol    Emphysema lung (HCC)    Family history of adverse reaction to anesthesia    N/V, TROUBLE BREATHING COMING OUT OF ANESTHESIA   Fatty liver    GERD (gastroesophageal reflux disease)    Headache    MIGRAINES   Hyperlipidemia    Hypertension    Irregular heart beat unk   Lung cancer (HCC) 7 5 2024   Myocardial infarction (HCC) 2010   MILD    PTSD (post-traumatic stress disorder)    Sleep apnea    USES CPAP   Tachycardia    Ventral hernia     Family History: Family History  Problem Relation Age of Onset   Hypertension Mother    Diabetes type II Mother    Diabetes Mother    COPD Father    Lung cancer Father    Heart disease Father    Diabetes Sister    Anxiety disorder Sister    Depression Sister    Diabetes Sister    Anxiety disorder Sister    Depression Sister    Diabetes Sister    Anxiety disorder Sister    Depression Sister    Diabetes Sister    Anxiety disorder Sister    Depression Sister    Diabetes Brother    Anxiety disorder  Brother    Depression Brother    Diabetes Maternal Aunt    Colon cancer Maternal Uncle     Social History   Socioeconomic History   Marital status: Divorced    Spouse name: Not on file   Number of children: 4   Years of education: Not on file   Highest education level: 8th grade  Occupational History   Not on file  Tobacco Use   Smoking status: Every Day    Current packs/day: 0.25    Average packs/day: 0.3 packs/day for 40.0 years (10.0 ttl pk-yrs)    Types: Cigarettes   Smokeless tobacco: Former    Types: Chew   Tobacco comments:    Reports smoking 1/2 pack a day and trying to cut back   Vaping Use   Vaping status: Former  Substance and Sexual Activity   Alcohol use: No    Alcohol/week: 0.0 standard drinks of alcohol   Drug use: No   Sexual activity: Not Currently  Other Topics Concern   Not on file  Social History Narrative   Not on file   Social Drivers of Health   Tobacco Use: High Risk (05/16/2024)   Patient History    Smoking Tobacco Use: Every Day    Smokeless Tobacco Use: Former    Passive Exposure: Not on Actuary Strain: Not on file  Food Insecurity: Food Insecurity Present (12/14/2022)   Hunger Vital Sign    Worried About Running Out of Food in the Last Year: Often true    Ran Out of Food in the Last Year: Often true  Transportation Needs: No Transportation Needs (12/14/2022)   PRAPARE - Administrator, Civil Service (Medical): No    Lack of Transportation (Non-Medical): No  Physical Activity: Not on file  Stress: Not on file  Social Connections: Not on file  Intimate Partner Violence: Not At Risk (12/14/2022)   Humiliation, Afraid, Rape, and Kick questionnaire    Fear of Current or Ex-Partner: No  Emotionally Abused: No    Physically Abused: No    Sexually Abused: No  Depression (PHQ2-9): Low Risk (01/02/2024)   Depression (PHQ2-9)    PHQ-2 Score: 2  Alcohol Screen: Low Risk (01/18/2022)   Alcohol Screen    Last  Alcohol Screening Score (AUDIT): 0  Housing: Medium Risk (12/14/2022)   Housing    Last Housing Risk Score: 1  Utilities: Not At Risk (12/14/2022)   AHC Utilities    Threatened with loss of utilities: No  Health Literacy: Not on file      Review of Systems  Constitutional:  Negative for chills, fatigue and unexpected weight change.  HENT:  Negative for congestion, rhinorrhea, sneezing and sore throat.   Eyes:  Negative for redness.  Respiratory: Negative.  Negative for cough, chest tightness, shortness of breath and wheezing.   Cardiovascular: Negative.  Negative for chest pain and palpitations.  Gastrointestinal:  Negative for abdominal pain, constipation, diarrhea, nausea and vomiting.  Genitourinary:  Negative for dysuria, flank pain and frequency.  Musculoskeletal:  Positive for arthralgias. Negative for back pain, joint swelling and neck pain.  Skin:  Negative for color change and rash.  Neurological: Negative.  Negative for tremors and numbness.       Neuropathy  Hematological:  Negative for adenopathy. Does not bruise/bleed easily.  Psychiatric/Behavioral:  Negative for behavioral problems (Depression), self-injury, sleep disturbance and suicidal ideas. The patient is not nervous/anxious.     Vital Signs: BP (!) 146/90   Pulse 100   Temp 97.7 F (36.5 C)   Resp 16   Ht 5' 9 (1.753 m)   Wt 213 lb 9.6 oz (96.9 kg)   SpO2 98%   BMI 31.54 kg/m    Physical Exam Vitals reviewed.  Constitutional:      General: He is not in acute distress.    Appearance: Normal appearance. He is obese. He is not ill-appearing.  HENT:     Head: Normocephalic and atraumatic.  Eyes:     Pupils: Pupils are equal, round, and reactive to light.  Cardiovascular:     Rate and Rhythm: Normal rate and regular rhythm.  Pulmonary:     Effort: Pulmonary effort is normal. No respiratory distress.  Neurological:     Mental Status: He is alert and oriented to person, place, and time.   Psychiatric:        Mood and Affect: Mood normal.        Behavior: Behavior normal.        Assessment/Plan: 1. Type 2 diabetes mellitus with other specified complication, with long-term current use of insulin  (HCC) (Primary) A1c is significantly elevated. Continue medications as prescribed. Patient encouraged to take his medications every day as prescribed. Urine sent to lab for ACR.  - POCT glycosylated hemoglobin (Hb A1C) - Semaglutide , 1 MG/DOSE, (OZEMPIC , 1 MG/DOSE,) 4 MG/3ML SOPN; Inject 1 mg into the skin once a week.  Dispense: 9 mL; Refill: 3 - insulin  glargine, 2 Unit Dial , (TOUJEO  MAX SOLOSTAR) 300 UNIT/ML Solostar Pen; Inject 50 Units into the skin daily.  Dispense: 15 mL; Refill: 3 - dapagliflozin  propanediol (FARXIGA ) 10 MG TABS tablet; Take 1 tablet (10 mg total) by mouth daily before breakfast.  Dispense: 90 tablet; Refill: 2 - Continuous Glucose Sensor (DEXCOM G7 SENSOR) MISC; USE ONE SENSOR EVERY 10 DAYS FOR UNCONTROLLED DIABETES  Dispense: 3 each; Refill: 11 - Urine Microalbumin w/creat. ratio  2. Hypertension associated with diabetes (HCC) Stable, continue medications as prescribed.   3. Aortic  atherosclerosis Continue statin therapy and other medications as prescribed.   4. Chronic pain of left knee Referred to orthopedic surgery - Ambulatory referral to Orthopedic Surgery   General Counseling: Shedric verbalizes understanding of the findings of todays visit and agrees with plan of treatment. I have discussed any further diagnostic evaluation that may be needed or ordered today. We also reviewed his medications today. he has been encouraged to call the office with any questions or concerns that should arise related to todays visit.    Orders Placed This Encounter  Procedures   Ambulatory referral to Orthopedic Surgery   POCT glycosylated hemoglobin (Hb A1C)    Meds ordered this encounter  Medications   Semaglutide , 1 MG/DOSE, (OZEMPIC , 1 MG/DOSE,) 4  MG/3ML SOPN    Sig: Inject 1 mg into the skin once a week.    Dispense:  9 mL    Refill:  3    For future refills, dx code E11.65   insulin  glargine, 2 Unit Dial , (TOUJEO  MAX SOLOSTAR) 300 UNIT/ML Solostar Pen    Sig: Inject 50 Units into the skin daily.    Dispense:  15 mL    Refill:  3   dapagliflozin  propanediol (FARXIGA ) 10 MG TABS tablet    Sig: Take 1 tablet (10 mg total) by mouth daily before breakfast.    Dispense:  90 tablet    Refill:  2   Continuous Glucose Sensor (DEXCOM G7 SENSOR) MISC    Sig: USE ONE SENSOR EVERY 10 DAYS FOR UNCONTROLLED DIABETES    Dispense:  3 each    Refill:  11    Dx code E11.65    Return in about 1 month (around 06/16/2024) for F/U, Shelle Galdamez PCP.   Total time spent:30 Minutes Time spent includes review of chart, medications, test results, and follow up plan with the patient.   [place] Controlled Substance Database was reviewed by me.  This patient was seen by Mardy Maxin, FNP-C in collaboration with Dr. Sigrid Bathe as a part of collaborative care agreement.   Azazel Franze R. Maxin, MSN, FNP-C Internal medicine

## 2024-05-17 LAB — MICROALBUMIN / CREATININE URINE RATIO
Creatinine, Urine: 27.5 mg/dL
Microalb/Creat Ratio: 77 mg/g{creat} — ABNORMAL HIGH (ref 0–29)
Microalbumin, Urine: 21.1 ug/mL

## 2024-05-20 ENCOUNTER — Encounter: Payer: Self-pay | Admitting: Nurse Practitioner

## 2024-05-20 ENCOUNTER — Other Ambulatory Visit: Payer: Self-pay

## 2024-05-20 ENCOUNTER — Telehealth: Payer: Self-pay | Admitting: Nurse Practitioner

## 2024-05-20 DIAGNOSIS — C3491 Malignant neoplasm of unspecified part of right bronchus or lung: Secondary | ICD-10-CM

## 2024-05-20 NOTE — Telephone Encounter (Signed)
 Orthopedic referral request submitted to [facility] for approval-Toni

## 2024-05-21 ENCOUNTER — Inpatient Hospital Stay: Admitting: Oncology

## 2024-05-21 ENCOUNTER — Encounter: Payer: Self-pay | Admitting: Oncology

## 2024-05-21 ENCOUNTER — Telehealth: Payer: Self-pay | Admitting: Oncology

## 2024-05-21 ENCOUNTER — Inpatient Hospital Stay

## 2024-05-21 NOTE — Telephone Encounter (Signed)
 Called pt to r/s CT, lab, MD follow up per secure chat - sending appt reminder to pt - [facility]

## 2024-05-29 ENCOUNTER — Ambulatory Visit

## 2024-05-29 VITALS — BP 126/73 | HR 111 | Ht 69.0 in | Wt 213.4 lb

## 2024-05-29 DIAGNOSIS — M1712 Unilateral primary osteoarthritis, left knee: Secondary | ICD-10-CM

## 2024-05-29 DIAGNOSIS — G8929 Other chronic pain: Secondary | ICD-10-CM

## 2024-05-29 DIAGNOSIS — M25562 Pain in left knee: Secondary | ICD-10-CM

## 2024-05-29 MED ORDER — MELOXICAM 15 MG PO TABS: 15.0000 mg | ORAL_TABLET | Freq: Every day | ORAL | 0 refills | Status: AC

## 2024-05-29 NOTE — Progress Notes (Addendum)
 "  Office Visit Note   Patient: Raymond [name]           Date of Birth: 1965/07/21           MRN: [id number] Visit Date: 05/29/2024              Requested by: Liana Fish, NP [address] [place],  [place] [address] PCP: Liana Fish, NP   Assessment & Plan: Visit Diagnoses:  1. Arthritis of left knee   2. Chronic pain of left knee     Plan: Natural history and expected course discussed. Questions answered. Recommend rest and ice. PT referral for knee strengthening and ROM. Recommed NSAIDS as needed for pain. Follow up prn Recommended urgent care for unexplained tachycardia Orders:  Orders Placed This Encounter  Procedures   DG Knee 3 Views Left     Subjective: Chief Complaint: Left knee pain  HPI Patient is a 59 y.o. year old male who presents with knee pain involving the  left knee. Onset of the symptoms was many years ago. Inciting event: none known. Current symptoms include pain located on the medial aspect of the knee. Pain is aggravated by pivoting and or picking something up.  Patient has had no prior history of knee problems.. Treatment to date: corticosteroid injection which was ineffective and glucosamine which is ineffective.  Objective: Vital Signs: BP 137/72   Pulse (!) 128   Ht 5' 9 (1.753 m)   Wt 96.8 kg   BMI 31.51 kg/m   Physical Exam Gen: Alert, No Acute Distress left knee: Skin intact, no erythema or induration noted. medial joint line and lateral patellar facet tenderness to palpation. Range of motion 0 to 130. No instability with varus or valgus stress. + patellar grind  Imaging: Radiographs personally reviewed by me; reveal mild osteoarthritis of the medial and lateral compartments, moderate to severe arthritis of the patellofemoral compartment left knee  MRI:   PMFS History: Patient Active Problem List   Diagnosis Date Noted   Lumbar facet arthropathy 01/02/2024   Spondylosis without myelopathy or radiculopathy,  lumbosacral region 01/02/2024   Osteoarthritis of facet joint of lumbar spine 01/02/2024   Osteoarthritis of sacroiliac joint (HCC) (Right) 12/18/2023   Sacroiliac joint dysfunction (Right) 12/18/2023   Grade 1 Retrolisthesis of C3/C4 11/28/2023   Grade 1 Anterolisthesis of cervical spine (C5/C6) 11/28/2023   Vitamin D  insufficiency 11/28/2023   Vitamin B 12 deficiency 11/28/2023   Chronic lower extremity pain (2ry area of Pain) (Right) 11/28/2023   Cervical facet joint pain 11/28/2023   Internal hemorrhoids 11/24/2023   History of lung cancer (Right) 11/07/2023   Chronic low back pain (Midline) w/o sciatica 11/07/2023   Chronic low back pain (1ry area of Pain) (Right) w/o sciatica 11/07/2023   Low back pain of over 3 months duration 11/07/2023   Low back pain potentially associated with radiculopathy 11/07/2023   Low back pain radiating to right leg 11/07/2023   Multifactorial low back pain 11/07/2023   Chronic hip pain (3ry area of Pain) (Right) 11/07/2023   Chronic sacroiliac joint pain (Right) 11/07/2023   Somatic dysfunction of sacroiliac joint (Right) 11/07/2023   Lumbar facet joint pain 11/07/2023   Chronic neck pain (4th area of Pain) (Right) 11/07/2023   Cervicalgia 11/07/2023   Cervicogenic headache (5th area of Pain) (Right) 11/07/2023   Chronic pain syndrome 11/06/2023   Pharmacologic therapy 11/06/2023   Disorder of skeletal system 11/06/2023   Problems influencing health status 11/06/2023   Lumbar  radiculopathy, chronic 09/25/2023   Spinal stenosis of lumbar region with neurogenic claudication 09/25/2023   Diabetic polyneuropathy associated with type 2 diabetes mellitus (HCC) 09/05/2023   Diabetes mellitus (HCC) 09/05/2023   Chronic diastolic heart failure (HCC) 09/05/2023   Obesity, morbid (HCC) 09/05/2023   Primary adenocarcinoma of right lung (HCC) 11/21/2022   Type 2 diabetes mellitus with hyperglycemia, with long-term current use of insulin  (HCC) 11/21/2022    Pleural effusion on right 11/20/2022   S/P robot-assisted Video Assisted Thoracoscopy with Right Lower Lobe Superior Segmentectomy, Node Dissection, and Intercostal Nerve Block 10/28/2022   Lung nodule 10/27/2022   Right lower lobe pulmonary nodule 10/27/2022   Aortic atherosclerosis 12/07/2021   Abnormal LFTs 04/22/2021   Insomnia due to medical condition 12/13/2020   At risk for prolonged QT interval syndrome 12/13/2020   Hypersomnia with sleep apnea 12/13/2020   Mixed hyperlipidemia 09/26/2020   Pulmonary emphysema (HCC) 09/26/2020   Barrett's esophagus without dysplasia    Gastric erythema    Special screening for malignant neoplasms, colon    Polyp of colon    MDD (major depressive disorder), recurrent episode, mild 06/23/2020   Noncompliance with diabetes treatment 04/12/2020   Angina pectoris 04/12/2020   MDD (major depressive disorder), recurrent, in full remission 01/05/2020   MDD (major depressive disorder), recurrent, in partial remission 10/30/2019   Tobacco use disorder 07/02/2019   Right upper quadrant pain    Encounter for other preprocedural examination 01/20/2019   PTSD (post-traumatic stress disorder) 01/20/2019   MDD (major depressive disorder), recurrent episode, moderate (HCC) 01/20/2019   Insomnia due to mental disorder 01/20/2019   Calculus of gallbladder with acute on chronic cholecystitis without obstruction 11/15/2018   Generalized abdominal pain 10/08/2018   Local superficial swelling, mass or lump 10/08/2018   Enterocolitis 08/16/2018   Left upper quadrant abdominal pain of unknown etiology 08/16/2018   Diastasis recti 06/19/2018   Anxiety disorder 04/01/2018   Clotted blood in stool 12/31/2017   Diarrhea 12/31/2017   Encounter for general adult medical examination with abnormal findings 10/27/2017   Uncontrolled type 2 diabetes mellitus with hyperglycemia (HCC) 10/27/2017   Onychomycosis with ingrown toenail 10/27/2017   Dysuria 10/27/2017    Uncontrolled type 2 diabetes mellitus with hypoglycemia without coma (HCC) 07/08/2017   Shortness of breath 07/08/2017   Cigarette nicotine  dependence without complication 07/08/2017   Influenza A 05/30/2017   OSA (obstructive sleep apnea) 08/12/2015   Cervical spondylosis 06/14/2015   Hypertension associated with diabetes (HCC) 04/08/2015   Depression 04/08/2015   Gastroesophageal reflux disease without esophagitis 01/05/2015   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 stopped when placed on metoprolol    Emphysema lung (HCC)    Family history of adverse reaction to anesthesia    N/V, TROUBLE BREATHING COMING OUT OF ANESTHESIA   Fatty liver    GERD (gastroesophageal reflux disease)    Headache    MIGRAINES   Hyperlipidemia    Hypertension    Irregular heart beat unk   Lung  cancer (HCC) 7 5 2024   Myocardial infarction (HCC) 2010   MILD    PTSD (post-traumatic stress disorder)    Sleep apnea    USES CPAP   Tachycardia    Ventral hernia     Family History  Problem Relation Age of Onset   Hypertension Mother    Diabetes type II Mother    Diabetes Mother    COPD Father    Lung cancer Father    Heart disease Father    Diabetes Sister    Anxiety disorder Sister    Depression Sister    Diabetes Sister    Anxiety disorder Sister    Depression Sister    Diabetes Sister    Anxiety disorder Sister    Depression Sister    Diabetes Sister    Anxiety disorder Sister    Depression Sister    Diabetes Brother    Anxiety disorder Brother    Depression Brother    Diabetes Maternal Aunt    Colon cancer  Maternal Uncle     Past Surgical History:  Procedure Laterality Date    ingrown toenail removal     CHOLECYSTECTOMY N/A 01/23/2019   Procedure: LAPAROSCOPIC CHOLECYSTECTOMY;  Surgeon: Desiderio Schanz, MD;  Location: [facility];  Service: General;  Laterality: N/A;   CLAVICLE SURGERY Right    COLONOSCOPY  2017   COLONOSCOPY WITH PROPOFOL  N/A 08/25/2020   Procedure: COLONOSCOPY WITH PROPOFOL ;  Surgeon: Janalyn [name] NOVAK, MD;  Location: [facility];  Service: Endoscopy;  Laterality: N/A;   ESOPHAGOGASTRODUODENOSCOPY (EGD) WITH PROPOFOL  N/A 08/25/2020   Procedure: ESOPHAGOGASTRODUODENOSCOPY (EGD) WITH PROPOFOL ;  Surgeon: Janalyn [name] NOVAK, MD;  Location: [facility];  Service: Endoscopy;  Laterality: N/A;   IR THORACENTESIS RIGHT ASP PLEURAL SPACE W/IMG GUIDE  11/20/2022   LUNG LOBECTOMY Right 2024   LYMPH NODE DISSECTION Right 10/27/2022   Procedure: LYMPH NODE DISSECTION;  Surgeon: Kerrin Elspeth BROCKS, MD;  Location: [facility];  Service: Thoracic;  Laterality: Right;   MOUTH SURGERY     REMOVED ALL TEETH   SHOULDER SURGERY Right    UPPER GI ENDOSCOPY  2017   XI ROBOTIC ASSISTED THORACOSCOPY- SEGMENTECTOMY Right 10/27/2022   Procedure: XI ROBOTIC ASSISTED THORACOSCOPY-RIGHT LOWER LOBE SUPERIOR SEGMENTECTOMY;  Surgeon: Kerrin Elspeth BROCKS, MD;  Location: [facility];  Service: Thoracic;  Laterality: Right;   Social History   Occupational History   Not on file  Tobacco Use   Smoking status: Every Day    Current packs/day: 0.25    Average packs/day: 0.3 packs/day for 40.0 years (10.0 ttl pk-yrs)    Types: Cigarettes   Smokeless tobacco: Former    Types: Chew   Tobacco comments:    Reports smoking 1/2 pack a day and trying to cut back   Vaping Use   Vaping status: Former  Substance and Sexual Activity   Alcohol use: No    Alcohol/week: 0.0 standard drinks of alcohol   Drug use: No   Sexual activity: Not Currently   Current Outpatient Medications  Medication Instructions    ACCU-CHEK GUIDE TEST test strip USE 1 TEST STRIP TO CHECK FASTING GLUCOSE ONCE DAILY AND AS NEEDED FOR DIABETES   Accu-Chek Softclix Lancets lancets Use 1 lancet to check glucose once daily and as needed for diabetes   acetaminophen  (TYLENOL ) 1,000 mg, Oral, 4 times daily   albuterol  (VENTOLIN  HFA) 108 (90 Base) MCG/ACT inhaler INHALE 2 PUFFS INTO THE LUNGS EVERY 6 HOURS AS MEEDED FOR WHEEZING OR SHORTNESS OF BREATH  aspirin  EC 81 mg, Daily   atorvastatin  (LIPITOR) 40 mg, Oral, Daily   Blood Glucose Monitoring Suppl (ACCU-CHEK GUIDE ME) w/Device KIT Use as directed. E11.65   BREZTRI  AEROSPHERE 160-9-4.8 MCG/ACT AERO inhaler 2 puffs, Inhalation, 2 times daily   buPROPion  (WELLBUTRIN  XL) 150 mg, Oral, Daily, Take along with 300 mg daily   buPROPion  (WELLBUTRIN  XL) 300 mg, Oral, Daily, Take along with 150 mg daily   Continuous Glucose Sensor (DEXCOM G7 SENSOR) MISC USE ONE SENSOR EVERY 10 DAYS FOR UNCONTROLLED DIABETES   cyclobenzaprine  (FLEXERIL ) 5-10 mg, Oral, 3 times daily PRN   dapagliflozin  propanediol (FARXIGA ) 10 mg, Oral, Daily before breakfast   DULoxetine  (CYMBALTA ) 60 mg, Oral, Daily, Take along with 30 mg daily   DULoxetine  (CYMBALTA ) 30 mg, Oral, Daily, Take along with 60 mg daily , total of 90 mg daily   ezetimibe  (ZETIA ) 10 mg, Oral, Daily   famotidine  (PEPCID ) 20 mg, Oral, 2 times daily   insulin  lispro (HUMALOG  KWIKPEN) 200 UNIT/ML KwikPen INJECT INSULIN  INTO THE SKIN WITH MEALS THREE TIMES DAILY PER SLIDING SCALE PROVIDED. MAX DAILY DOSE IS 42 UNITS   Insulin  Pen Needle 32G X 4 MM MISC Use with daily dose insulin  and with sliding scale insulin  as needed.   Melatonin 30 mg, Daily at bedtime   meloxicam  (MOBIC ) 15 mg, Daily   metoprolol  tartrate (LOPRESSOR ) 100 mg, Oral, 2 times daily   nitroGLYCERIN  (NITROSTAT ) 0.4 mg, Sublingual, Every 5 min x3 PRN   oxyCODONE  (OXY IR/ROXICODONE ) 5 mg, Oral, 2 times daily PRN   Ozempic  (1 MG/DOSE) 1 mg, Subcutaneous, Weekly   pantoprazole   (PROTONIX ) 40 mg, Oral, Daily   pregabalin  (LYRICA ) 50 mg, Oral, 3 times daily, For 7 days then increase to 2 capsules PO 3 times daily   rOPINIRole  (REQUIP ) 0.5 mg, Oral, 3 times daily   tamsulosin  (FLOMAX ) 0.4 mg, Oral, Daily   temazepam  (RESTORIL ) 30 MG capsule TAKE 1 CAPSULE BY MOUTH AT BEDTIME AS NEEDED FOR SLEEP   Toujeo  Max SoloStar 50 Units, Subcutaneous, Daily   varenicline  (CHANTIX ) 1 mg, Oral, 2 times daily   Vitamin B-12 5,000 mcg, Sublingual, Daily   Allergies as of 05/29/2024 - Review Complete 05/20/2024  Allergen Reaction Noted   Onion Anaphylaxis 03/30/2015   Norco [hydrocodone-acetaminophen ] Itching 11/02/2014   Nickel Rash 01/20/2019   Nicoderm [nicotine ] Rash 01/03/2021   "

## 2024-05-29 NOTE — Patient Instructions (Signed)
 Start exercise program; stretching and strengthening knee. Wear knee brace for additional support/comfort. Apply ice, 20 minutes at a time, several times a day. Take Meloxicam  for pain/swelling/inflammation. One tablet once a day. Take with food. Do NOT take over the counter ibuprofen  (Motrin , Advil ) or naproxen (Aleve), when taking Meloxicam . Return to clinic in three months if no or limited symptom improvement.

## 2024-06-17 ENCOUNTER — Ambulatory Visit: Admit: 2024-06-17 | Admitting: Internal Medicine

## 2024-06-20 ENCOUNTER — Ambulatory Visit: Admitting: Nurse Practitioner

## 2024-06-27 ENCOUNTER — Other Ambulatory Visit

## 2024-07-04 ENCOUNTER — Inpatient Hospital Stay: Admitting: Oncology

## 2024-07-04 ENCOUNTER — Inpatient Hospital Stay

## 2024-07-15 ENCOUNTER — Ambulatory Visit: Admitting: Psychiatry

## 2024-10-15 ENCOUNTER — Encounter: Admitting: Internal Medicine

## 2024-10-27 ENCOUNTER — Ambulatory Visit: Admitting: Nurse Practitioner

## 2024-10-28 ENCOUNTER — Ambulatory Visit: Admitting: Internal Medicine

## 2024-11-12 ENCOUNTER — Encounter: Admitting: Internal Medicine

## 2024-11-24 ENCOUNTER — Ambulatory Visit: Admitting: Internal Medicine

## 2024-12-22 ENCOUNTER — Ambulatory Visit: Admitting: Internal Medicine
